# Patient Record
Sex: Female | Born: 1940 | Race: White | Hispanic: No | Marital: Married | Smoking: Never smoker
Health system: Southern US, Community
[De-identification: ages and names within clinical notes are randomized; demographics above are authoritative.]

## PROBLEM LIST (undated history)

## (undated) DIAGNOSIS — M8580 Other specified disorders of bone density and structure, unspecified site: Secondary | ICD-10-CM

## (undated) DIAGNOSIS — H9319 Tinnitus, unspecified ear: Secondary | ICD-10-CM

## (undated) DIAGNOSIS — K219 Gastro-esophageal reflux disease without esophagitis: Secondary | ICD-10-CM

## (undated) DIAGNOSIS — D126 Benign neoplasm of colon, unspecified: Secondary | ICD-10-CM

## (undated) DIAGNOSIS — E739 Lactose intolerance, unspecified: Secondary | ICD-10-CM

## (undated) DIAGNOSIS — M1990 Unspecified osteoarthritis, unspecified site: Secondary | ICD-10-CM

## (undated) DIAGNOSIS — M797 Fibromyalgia: Secondary | ICD-10-CM

## (undated) DIAGNOSIS — I447 Left bundle-branch block, unspecified: Secondary | ICD-10-CM

## (undated) DIAGNOSIS — T7840XA Allergy, unspecified, initial encounter: Secondary | ICD-10-CM

## (undated) DIAGNOSIS — J189 Pneumonia, unspecified organism: Secondary | ICD-10-CM

## (undated) DIAGNOSIS — F329 Major depressive disorder, single episode, unspecified: Secondary | ICD-10-CM

## (undated) DIAGNOSIS — E079 Disorder of thyroid, unspecified: Secondary | ICD-10-CM

## (undated) DIAGNOSIS — K8020 Calculus of gallbladder without cholecystitis without obstruction: Secondary | ICD-10-CM

## (undated) DIAGNOSIS — R51 Headache: Secondary | ICD-10-CM

## (undated) DIAGNOSIS — I10 Essential (primary) hypertension: Secondary | ICD-10-CM

## (undated) DIAGNOSIS — H269 Unspecified cataract: Secondary | ICD-10-CM

## (undated) DIAGNOSIS — G8929 Other chronic pain: Secondary | ICD-10-CM

## (undated) DIAGNOSIS — E785 Hyperlipidemia, unspecified: Secondary | ICD-10-CM

## (undated) DIAGNOSIS — G500 Trigeminal neuralgia: Secondary | ICD-10-CM

## (undated) DIAGNOSIS — F32A Depression, unspecified: Secondary | ICD-10-CM

## (undated) DIAGNOSIS — N3010 Interstitial cystitis (chronic) without hematuria: Secondary | ICD-10-CM

## (undated) HISTORY — DX: Other chronic pain: G89.29

## (undated) HISTORY — DX: Allergy, unspecified, initial encounter: T78.40XA

## (undated) HISTORY — DX: Unspecified cataract: H26.9

## (undated) HISTORY — DX: Unspecified osteoarthritis, unspecified site: M19.90

## (undated) HISTORY — DX: Benign neoplasm of colon, unspecified: D12.6

## (undated) HISTORY — DX: Depression, unspecified: F32.A

## (undated) HISTORY — DX: Headache: R51

## (undated) HISTORY — DX: Fibromyalgia: M79.7

## (undated) HISTORY — DX: Hyperlipidemia, unspecified: E78.5

## (undated) HISTORY — DX: Calculus of gallbladder without cholecystitis without obstruction: K80.20

## (undated) HISTORY — PX: TONSILLECTOMY: SUR1361

## (undated) HISTORY — DX: Left bundle-branch block, unspecified: I44.7

## (undated) HISTORY — DX: Trigeminal neuralgia: G50.0

## (undated) HISTORY — DX: Gastro-esophageal reflux disease without esophagitis: K21.9

## (undated) HISTORY — DX: Other specified disorders of bone density and structure, unspecified site: M85.80

## (undated) HISTORY — DX: Lactose intolerance, unspecified: E73.9

## (undated) HISTORY — DX: Pneumonia, unspecified organism: J18.9

## (undated) HISTORY — PX: CYSTOSCOPY: SUR368

## (undated) HISTORY — DX: Tinnitus, unspecified ear: H93.19

## (undated) HISTORY — DX: Interstitial cystitis (chronic) without hematuria: N30.10

## (undated) HISTORY — DX: Essential (primary) hypertension: I10

## (undated) HISTORY — PX: EYE SURGERY: SHX253

## (undated) HISTORY — DX: Disorder of thyroid, unspecified: E07.9

---
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
## 1998-05-21 ENCOUNTER — Ambulatory Visit (HOSPITAL_COMMUNITY): Admission: RE | Admit: 1998-05-21 | Discharge: 1998-05-21 | Payer: Self-pay | Admitting: Obstetrics & Gynecology

## 1999-09-21 ENCOUNTER — Emergency Department (HOSPITAL_COMMUNITY): Admission: EM | Admit: 1999-09-21 | Discharge: 1999-09-21 | Payer: Self-pay | Admitting: Emergency Medicine

## 2000-02-11 ENCOUNTER — Encounter: Admission: RE | Admit: 2000-02-11 | Discharge: 2000-03-10 | Payer: Self-pay

## 2000-05-10 ENCOUNTER — Inpatient Hospital Stay (HOSPITAL_COMMUNITY): Admission: EM | Admit: 2000-05-10 | Discharge: 2000-05-11 | Payer: Self-pay | Admitting: Internal Medicine

## 2001-02-07 ENCOUNTER — Other Ambulatory Visit: Admission: RE | Admit: 2001-02-07 | Discharge: 2001-02-07 | Payer: Self-pay | Admitting: Obstetrics and Gynecology

## 2002-02-02 ENCOUNTER — Other Ambulatory Visit: Admission: RE | Admit: 2002-02-02 | Discharge: 2002-02-02 | Payer: Self-pay | Admitting: Obstetrics & Gynecology

## 2003-03-21 ENCOUNTER — Other Ambulatory Visit: Admission: RE | Admit: 2003-03-21 | Discharge: 2003-03-21 | Payer: Self-pay | Admitting: Obstetrics & Gynecology

## 2004-04-17 ENCOUNTER — Other Ambulatory Visit: Admission: RE | Admit: 2004-04-17 | Discharge: 2004-04-17 | Payer: Self-pay | Admitting: Obstetrics & Gynecology

## 2005-05-20 ENCOUNTER — Other Ambulatory Visit: Admission: RE | Admit: 2005-05-20 | Discharge: 2005-05-20 | Payer: Self-pay | Admitting: Obstetrics & Gynecology

## 2007-06-23 HISTORY — PX: COLONOSCOPY: SHX174

## 2008-08-31 ENCOUNTER — Encounter: Admission: RE | Admit: 2008-08-31 | Discharge: 2008-08-31 | Payer: Self-pay | Admitting: Rheumatology

## 2010-02-16 ENCOUNTER — Emergency Department (HOSPITAL_COMMUNITY): Admission: EM | Admit: 2010-02-16 | Discharge: 2010-02-17 | Payer: Self-pay | Admitting: Emergency Medicine

## 2010-03-22 HISTORY — PX: CHOLECYSTECTOMY: SHX55

## 2010-05-30 ENCOUNTER — Encounter
Admission: RE | Admit: 2010-05-30 | Discharge: 2010-05-30 | Payer: Self-pay | Source: Home / Self Care | Attending: Internal Medicine | Admitting: Internal Medicine

## 2010-08-21 ENCOUNTER — Other Ambulatory Visit: Payer: Self-pay | Admitting: Internal Medicine

## 2010-08-21 ENCOUNTER — Ambulatory Visit
Admission: RE | Admit: 2010-08-21 | Discharge: 2010-08-21 | Disposition: A | Discharge status: Home / Self Care | Payer: Medicare Other | Source: Ambulatory Visit | Attending: Internal Medicine | Admitting: Internal Medicine

## 2010-08-21 DIAGNOSIS — R52 Pain, unspecified: Secondary | ICD-10-CM

## 2010-08-21 DIAGNOSIS — R609 Edema, unspecified: Secondary | ICD-10-CM

## 2010-09-01 ENCOUNTER — Emergency Department (HOSPITAL_COMMUNITY): Payer: Medicare Other

## 2010-09-01 ENCOUNTER — Emergency Department (HOSPITAL_COMMUNITY)
Admission: EM | Admit: 2010-09-01 | Discharge: 2010-09-01 | Disposition: A | Discharge status: Home / Self Care | Payer: Medicare Other | Attending: Emergency Medicine | Admitting: Emergency Medicine

## 2010-09-01 DIAGNOSIS — R109 Unspecified abdominal pain: Secondary | ICD-10-CM | POA: Insufficient documentation

## 2010-09-01 DIAGNOSIS — R112 Nausea with vomiting, unspecified: Secondary | ICD-10-CM | POA: Insufficient documentation

## 2010-09-01 DIAGNOSIS — Z79899 Other long term (current) drug therapy: Secondary | ICD-10-CM | POA: Insufficient documentation

## 2010-09-01 DIAGNOSIS — N1330 Unspecified hydronephrosis: Secondary | ICD-10-CM | POA: Insufficient documentation

## 2010-09-01 LAB — DIFFERENTIAL
Basophils Absolute: 0 10*3/uL (ref 0.0–0.1)
Basophils Relative: 0 % (ref 0–1)
Eosinophils Absolute: 0.2 10*3/uL (ref 0.0–0.7)
Eosinophils Relative: 2 % (ref 0–5)
Lymphocytes Relative: 10 % — ABNORMAL LOW (ref 12–46)
Lymphs Abs: 1.2 10*3/uL (ref 0.7–4.0)
Monocytes Absolute: 1 10*3/uL (ref 0.1–1.0)
Monocytes Relative: 8 % (ref 3–12)
Neutro Abs: 9.7 10*3/uL — ABNORMAL HIGH (ref 1.7–7.7)
Neutrophils Relative %: 80 % — ABNORMAL HIGH (ref 43–77)

## 2010-09-01 LAB — CBC
HCT: 44.8 % (ref 36.0–46.0)
Hemoglobin: 15 g/dL (ref 12.0–15.0)
MCH: 30.5 pg (ref 26.0–34.0)
MCHC: 33.5 g/dL (ref 30.0–36.0)
MCV: 91.2 fL (ref 78.0–100.0)
Platelets: 200 10*3/uL (ref 150–400)
RBC: 4.91 MIL/uL (ref 3.87–5.11)
RDW: 13.1 % (ref 11.5–15.5)
WBC: 12.2 10*3/uL — ABNORMAL HIGH (ref 4.0–10.5)

## 2010-09-01 LAB — COMPREHENSIVE METABOLIC PANEL
ALT: 20 U/L (ref 0–35)
AST: 22 U/L (ref 0–37)
Albumin: 3.8 g/dL (ref 3.5–5.2)
Alkaline Phosphatase: 44 U/L (ref 39–117)
BUN: 29 mg/dL — ABNORMAL HIGH (ref 6–23)
CO2: 23 mEq/L (ref 19–32)
Calcium: 9.3 mg/dL (ref 8.4–10.5)
Chloride: 105 mEq/L (ref 96–112)
Creatinine, Ser: 1.31 mg/dL — ABNORMAL HIGH (ref 0.4–1.2)
GFR calc Af Amer: 49 mL/min — ABNORMAL LOW (ref >60)
GFR calc non Af Amer: 40 mL/min — ABNORMAL LOW (ref >60)
Glucose, Bld: 124 mg/dL — ABNORMAL HIGH (ref 70–99)
Potassium: 4.2 mEq/L (ref 3.5–5.1)
Sodium: 138 mEq/L (ref 135–145)
Total Bilirubin: 0.6 mg/dL (ref 0.3–1.2)
Total Protein: 6.6 g/dL (ref 6.0–8.3)

## 2010-09-01 LAB — URINALYSIS, ROUTINE W REFLEX MICROSCOPIC
Bilirubin Urine: NEGATIVE
Glucose, UA: NEGATIVE mg/dL
Ketones, ur: NEGATIVE mg/dL
Nitrite: NEGATIVE
Protein, ur: NEGATIVE mg/dL
Specific Gravity, Urine: 1.019 (ref 1.005–1.030)
Urobilinogen, UA: 0.2 mg/dL (ref 0.0–1.0)
pH: 5.5 (ref 5.0–8.0)

## 2010-09-01 LAB — URINE MICROSCOPIC-ADD ON

## 2010-09-01 LAB — LIPASE, BLOOD: Lipase: 31 U/L (ref 11–59)

## 2010-09-05 LAB — COMPREHENSIVE METABOLIC PANEL
ALT: 53 U/L — ABNORMAL HIGH (ref 0–35)
AST: 95 U/L — ABNORMAL HIGH (ref 0–37)
Albumin: 3.8 g/dL (ref 3.5–5.2)
Alkaline Phosphatase: 65 U/L (ref 39–117)
BUN: 22 mg/dL (ref 6–23)
CO2: 27 mEq/L (ref 19–32)
Calcium: 9.9 mg/dL (ref 8.4–10.5)
Chloride: 106 mEq/L (ref 96–112)
Creatinine, Ser: 0.93 mg/dL (ref 0.4–1.2)
GFR calc Af Amer: >60 mL/min (ref >60)
GFR calc non Af Amer: 60 mL/min — ABNORMAL LOW (ref >60)
Glucose, Bld: 113 mg/dL — ABNORMAL HIGH (ref 70–99)
Potassium: 4.1 mEq/L (ref 3.5–5.1)
Sodium: 139 mEq/L (ref 135–145)
Total Bilirubin: 0.4 mg/dL (ref 0.3–1.2)
Total Protein: 6.8 g/dL (ref 6.0–8.3)

## 2010-09-05 LAB — CBC
HCT: 41.5 % (ref 36.0–46.0)
Hemoglobin: 14.3 g/dL (ref 12.0–15.0)
MCH: 31 pg (ref 26.0–34.0)
MCHC: 34.5 g/dL (ref 30.0–36.0)
MCV: 89.8 fL (ref 78.0–100.0)
Platelets: 186 10*3/uL (ref 150–400)
RBC: 4.62 MIL/uL (ref 3.87–5.11)
RDW: 13.2 % (ref 11.5–15.5)
WBC: 8.8 10*3/uL (ref 4.0–10.5)

## 2010-09-05 LAB — POCT CARDIAC MARKERS
CKMB, poc: 1.7 ng/mL (ref 1.0–8.0)
CKMB, poc: <1 ng/mL — ABNORMAL LOW (ref 1.0–8.0)
Myoglobin, poc: 54.2 ng/mL (ref 12–200)
Myoglobin, poc: 74.8 ng/mL (ref 12–200)
Troponin i, poc: <0.05 ng/mL (ref 0.00–0.09)
Troponin i, poc: <0.05 ng/mL (ref 0.00–0.09)

## 2010-09-05 LAB — DIFFERENTIAL
Basophils Absolute: 0 10*3/uL (ref 0.0–0.1)
Basophils Relative: 0 % (ref 0–1)
Eosinophils Absolute: 0.1 10*3/uL (ref 0.0–0.7)
Eosinophils Relative: 2 % (ref 0–5)
Lymphocytes Relative: 14 % (ref 12–46)
Lymphs Abs: 1.2 10*3/uL (ref 0.7–4.0)
Monocytes Absolute: 0.6 10*3/uL (ref 0.1–1.0)
Monocytes Relative: 7 % (ref 3–12)
Neutro Abs: 6.8 10*3/uL (ref 1.7–7.7)
Neutrophils Relative %: 77 % (ref 43–77)

## 2010-09-05 LAB — LIPASE, BLOOD: Lipase: 32 U/L (ref 11–59)

## 2010-09-11 ENCOUNTER — Ambulatory Visit (HOSPITAL_BASED_OUTPATIENT_CLINIC_OR_DEPARTMENT_OTHER)
Admission: RE | Admit: 2010-09-11 | Discharge: 2010-09-11 | Disposition: A | Discharge status: Home / Self Care | Payer: Medicare Other | Source: Ambulatory Visit | Attending: Urology | Admitting: Urology

## 2010-09-11 ENCOUNTER — Other Ambulatory Visit: Payer: Self-pay | Admitting: Urology

## 2010-09-11 DIAGNOSIS — N289 Disorder of kidney and ureter, unspecified: Secondary | ICD-10-CM | POA: Insufficient documentation

## 2010-09-11 DIAGNOSIS — R109 Unspecified abdominal pain: Secondary | ICD-10-CM | POA: Insufficient documentation

## 2010-09-11 DIAGNOSIS — Z01812 Encounter for preprocedural laboratory examination: Secondary | ICD-10-CM | POA: Insufficient documentation

## 2010-09-11 DIAGNOSIS — N1330 Unspecified hydronephrosis: Secondary | ICD-10-CM | POA: Insufficient documentation

## 2010-09-11 DIAGNOSIS — Z79899 Other long term (current) drug therapy: Secondary | ICD-10-CM | POA: Insufficient documentation

## 2010-09-11 LAB — POCT I-STAT 4, (NA,K, GLUC, HGB,HCT)
Glucose, Bld: 83 mg/dL (ref 70–99)
HCT: 44 % (ref 36.0–46.0)
Hemoglobin: 15 g/dL (ref 12.0–15.0)
Potassium: 3.9 mEq/L (ref 3.5–5.1)
Sodium: 145 mEq/L (ref 135–145)

## 2010-09-12 NOTE — Op Note (Signed)
NAMESUMIYE, HIRTH             ACCOUNT NO.:  [id number]  MEDICAL RECORD NO.:  [id number]           PATIENT TYPE:  E  LOCATION:  [facility]                         FACILITY:  [facility]  PHYSICIAN:  Excell Seltzer. Annabell Howells, M.D.    DATE OF BIRTH:  12-Jun-1941  DATE OF PROCEDURE: DATE OF DISCHARGE:  09/01/2010                              OPERATIVE REPORT   Patient of Dr. Bjorn Pippin.  PREOPERATIVE DIAGNOSIS:  Left proximal ureteral lesion.  POSTOPERATIVE DIAGNOSIS:  Left proximal ureteral lesion.  PROCEDURES:  Cystoscopy, left retrograde pyelogram with interpretation, left renal pelvic washings, left ureteroscopy, insertion of left double- J stent.  SURGEON:  Excell Seltzer. Annabell Howells, M.D.  ANESTHESIA:  General.  SPECIMEN:  Washings from the left renal pelvis.  DRAIN:  6-French 24 cm contour double-J stent.  COMPLICATIONS:  Minor ureteral perforation.  INDICATIONS:  Bernarda is a 70-year-old white female with a history of interstitial cystitis who presented on 12th with severe left flank pain. A CT urogram demonstrated left-sided hydronephrosis with a vague 7 x 5 mm focus of increased and attenuation and the left proximal ureter about 4 cm below the renal pelvis without definitive stone.  It was felt that ureteroscopy was indicated for possible biopsy and management of this lesion.  FINDINGS AND PROCEDURE:  She was taken to the operating room where a general anesthetic was induced.  She was given 400 mg of Cipro and was fitted PAS hose and was placed in the lithotomy position.  Her perineum and genitalia were prepped with Betadine solution.  She was draped in the usual sterile fashion.  Cystoscopy was performed using a 22-French scope and 12-degree lens.  Examination revealed a normal urethra.  The bladder wall had mild trabeculation.  There were some stellate scars on the posterior wall from prior biopsies related to interstitial cystitis. No tumors or stones were noted.  Ureteral orifices were  unremarkable.  The left ureteral orifice was cannulated with 5-French open-ended catheter and contrast was instilled.  This study revealed a delicate, but otherwise normal distal and mid ureter.  In the proximal ureter, 4 cm to 5 cm below the UPJ, there was a slight ruddiness and narrowing of the ureter.  No obvious calcification was noted and I would not describe it truly is a filling defect as much as ruddiness.  The internal collecting system had mild dilation, but was otherwise unremarkable.  Once retrograde pyelogram had been performed, a sensor guidewire was passed through the open-end catheter and during an attempt to bypass the narrowed area of the tip once through the medial ureteral wall with minimal pressure, some dye extravasated from the residual retrograde pyelogram.  At this point, I used a Glidewire angle the tip laterally and was able to negotiate the wire into the renal pelvis.  The open-end catheter was then inserted over the Glidewire.  The Glidewire was removed and a saline was used to obtain a barbotage cytology from the renal pelvis.  Once the cytology was obtained, the sensor wire was placed back to the kidney and the open-end catheter was removed.  An attempt was made to pass a 6 short  ureteroscope alongside the wire, but the meatus was too narrow.  I then passed a 12-French dilator over the wire, but it would not advance beyond the distal ureter.  The ureteroscope was then reinserted.  The distal ureter was unremarkable, but approximately 5 cm up, there was some a little mucosal splitting from the dilator and ureteroscope would not pass due to the delicate nature of the ureter proximal to this.  At this point, I felt further attempts to ureteroscopy would be counterproductive  The cystoscope was reinserted over the wire and a 6-French 24 cm contour double-J stent was inserted under fluoroscopic guidance to the kidney. The wire was removed, leaving good  coil in the kidney, good coil in the bladder.  At this point, the bladder was drained.  A B and O suppository had been placed early prior to prepping.  The patient was taken down from the lithotomy position.  Her anesthetic was reversed.  She was moved to the recovery room in stable condition.  She has pain medicine of Pyridium on hand.  She had followup scheduled for 03/29, benign going to schedule a second ureteroscopic procedure in approximately 7 to 10 days after the ureters had not an opportunity to self-dilate.     Excell Seltzer. Annabell Howells, M.D.     JJW/MEDQ  D:  09/11/2010  T:  09/12/2010  Job:  [id number]  cc:   Dr. Awanda Mink  Electronically Signed by Bjorn Pippin M.D. on 09/12/2010 07:20:02 AM

## 2010-09-18 ENCOUNTER — Other Ambulatory Visit (HOSPITAL_COMMUNITY): Payer: Medicare Other

## 2010-09-19 ENCOUNTER — Other Ambulatory Visit (HOSPITAL_COMMUNITY): Payer: Medicare Other

## 2010-09-24 ENCOUNTER — Other Ambulatory Visit: Payer: Self-pay | Admitting: Urology

## 2010-09-25 ENCOUNTER — Ambulatory Visit (HOSPITAL_COMMUNITY)
Admission: RE | Admit: 2010-09-25 | Discharge: 2010-09-25 | Disposition: A | Discharge status: Home / Self Care | Payer: Medicare Other | Source: Ambulatory Visit | Attending: Urology | Admitting: Urology

## 2010-09-25 DIAGNOSIS — N289 Disorder of kidney and ureter, unspecified: Secondary | ICD-10-CM | POA: Insufficient documentation

## 2010-09-25 DIAGNOSIS — N135 Crossing vessel and stricture of ureter without hydronephrosis: Secondary | ICD-10-CM | POA: Insufficient documentation

## 2010-09-25 DIAGNOSIS — Z01812 Encounter for preprocedural laboratory examination: Secondary | ICD-10-CM | POA: Insufficient documentation

## 2010-09-25 LAB — CBC
HCT: 38.2 % (ref 36.0–46.0)
Hemoglobin: 12.4 g/dL (ref 12.0–15.0)
MCH: 30 pg (ref 26.0–34.0)
MCHC: 32.5 g/dL (ref 30.0–36.0)
MCV: 92.5 fL (ref 78.0–100.0)
Platelets: 173 10*3/uL (ref 150–400)
RBC: 4.13 MIL/uL (ref 3.87–5.11)
RDW: 12.9 % (ref 11.5–15.5)
WBC: 5.1 10*3/uL (ref 4.0–10.5)

## 2010-09-25 LAB — MRSA PCR SCREENING: MRSA by PCR: NEGATIVE

## 2010-10-09 NOTE — Op Note (Signed)
[name], [name] NO.:  [id number]  MEDICAL RECORD NO.:  [id number]           PATIENT TYPE:  O  LOCATION:  [facility]                         FACILITY:  [facility]  PHYSICIAN:  Excell Seltzer. Annabell Howells, M.D.    DATE OF BIRTH:  May 12, 1941  DATE OF PROCEDURE:  09/25/2010 DATE OF DISCHARGE:  09/25/2010                              OPERATIVE REPORT   Patient of Dr. Bjorn Pippin.  PROCEDURES:  Cystoscopy, removal of left double-J stent, left ureteroscopy with brush biopsy of ureteral lesion, and dilation of ureteral stricture, reinsertion of left double-J stent.  PREOPERATIVE DIAGNOSIS:  Lesion of the left proximal ureter.  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 Annabell Howells, M.D.  ANESTHESIA:  General.  SPECIMEN:  Brushings from the left proximal ureter.  DRAIN:  8-French 24-cm double-J stent.  COMPLICATIONS:  None.  INDICATIONS:  [name] is a 70 year old white female who was found to have a possible lesion within the left proximal ureter following the onset of sudden, severe left flank pain on March 12.  An initial attempt at ureteroscopy was unsuccessful due to a narrow ureter and difficult access.  She had a stent left indwelling and returns today in followup for 2nd attempt.  FINDINGS AND PROCEDURE:  She was given Cipro.  She was taken to the operating room where general anesthetic was induced.  She was placed in lithotomy position.  Her perineum and genitalia were prepped with Betadine solution.  She was draped in the usual sterile fashion.  Time- out was performed.  The 22-French cystoscope with 12-degree lens was inserted.  The stent was visualized, grasped, and pulled the urethral meatus and a wire was passed to the kidney.  The old stent was removed.  The 6-French short ureteroscope was then advanced alongside the wire and during passage, there was  some narrowing of the mid ureter with some submucosal-appearing calcifications and stiffness of the ureter.  I was unable to get the rigid scope up to L2 which was the level of the lesion.  At this point, a 25-cm access sheath was inserted over the guidewire. There was some difficulty advancing the sheath due to the narrowed distal ureter; however, I was eventually able to get it up and was able to advance the 6.4-French digital flexible scope to the level of the lesion at L2.  The lesion demonstrated erythematous mucosa that appeared most consistent with inflammation, although carcinoma in situ is a possibility.  There was dystrophic calcification with a patch approximately 5 mm in size on the wall of the ureter.  I was unable to advance the scope through this area.  A guidewire was then reinserted to the kidney and a longer access sheath dilator core was then passed across the area of narrowing.  The dilator passed easily.  I then reinserted the flexible cystoscope and was able to access the area of the stricture and lesion more readily and it was noted that the ureter had split for approximately 3 cm at the 12 o'clock position, most suggestive of a stiffened inflamed  ureter with loss of pliability.  I advanced the ureteroscope to the kidney.  She had some proximal ureteral dilation, but no lesions were noted in the internal collecting system.  At this point, an attempt was made to obtain a cup biopsy from the ureteral wall; however, I was unable to engage the tissue sufficiently to obtain a biopsy.  I then removed the ureteroscope and inserted a brush biopsy device which was moved back and forth across the area of the lesion.  The brush was then clipped off and sent for pathology.  At this point, the guidewire was reinserted to the kidney.  The access sheath was removed.  The cystoscope was reinserted and an 8-French 24-cm double-J stent was then advanced to the kidney without  difficulty under fluoroscopic guidance.  The wire was removed leaving a good curl in the kidney, a good curl in the bladder.  The bladder was drained.  The patient was taken down from lithotomy position.  Her anesthetic was reversed.  She was moved to the recovery room in stable condition. There were no complications.     Excell Seltzer. Annabell Howells, M.D.     JJW/MEDQ  D:  09/25/2010  T:  09/26/2010  Job:  [id number]  cc:   Soyla Murphy. Renne Crigler, M.D. Fax: [phone]  Electronically Signed by Bjorn Pippin M.D. on 10/09/2010 02:15:29 PM

## 2010-11-07 NOTE — H&P (Signed)
[facility]  Patient:    LIVANA, YERIAN                      MRN: [id number] Adm. Date:  05/10/00 Attending:  Soyla Murphy. Renne Crigler, M.D.                         History and Physical  DATE OF BIRTH:  Sep 01, 1940.  HISTORY OF PRESENT ILLNESS:  [name] is a 70 year old married white resident of [place] who comes in with a chief complaint of chest pain.  It came on during severe exertion today at tennis clinic.  She described it as both sharp and dull at the same time in the central substernal area, radiating straight to the back.  It was associated with shortness of breath and was 8/10 in intensity.  It radiated a little up to her right shoulder blade and lasted about five minutes; she was then left with some muscle soreness.  She points to the area beneath the central breasts on both sides and front.  She denies any change in exercise tolerance lately or any other chest pain or pressure. She was a little bit sweaty with this, but she had been doing the tennis drills.  Risk factors for coronary artery disease include only a family history of early heart disease in her mother, who died of a heart attack in her 37s.  PAST HISTORY:  Past history is notable for abnormal baseline EKG -- left bundle branch block.  This was evaluated years ago with an echocardiogram, which was basically normal.  REVIEW OF SYSTEMS:  This is notable for occasional sinus headaches, hayfever that is seasonal, nasal congestion.  She denies any poor energy or poor sleep. There is no increase in weight or other weight change, night sweats, fatigue or temperature intolerance.  She denies any poor appetite, increase in thirst, anxiety, depression, memory loss, seizures, faints, numbness and tingling, tremors, lumps or bumps, skin changes, easy bruising, easy bleeding, change in vision, eye trouble, change in hearing or ringing in the ears.  She denies any balance trouble, any mood  swings, sore throat, hoarseness, wheezing, coughing, hemoptysis, orthopnea, PND, edema, palpitations, heart murmur, leg cramps, swollen feet or ankles or varicose veins.  She denies any heartburn, trouble swallowing, nausea, vomiting, diarrhea, constipation, hematochezia, vertigo, blood in bowel movements or black bowel movements.  She denies any jaundice, abdominal pain, hemorrhoids, rectal pain, change in bowel habits, kidney stones or change in her urination.  There is no vaginal or urinary discharge or burning.  She has chronic aching muscles and joints consistent with her fibromyalgia.  No back or neck pain or swollen joints noted.  She denies any breast lumps or pain or any breast discharge.  She had breast, rectal and pelvic exams done by her gynecologist in August, Dr. Esmeralda Arthur, and she does not wish to have that repeated today.  PHYSICAL EXAMINATION  VITAL SIGNS:  Temperature 96.8, respirations 20 and pulse is 83.  BP is 122/80.  GENERAL:  On examination, she appears entirely comfortable.  Color is good. She is in no distress.  SKIN:  Skin shows a few venous telangiectasias of the lower legs.  LYMPHATICS:  There is no cervical, supraclavicular, axillary or inguinal adenopathy.  HEENT:  Normocephalic, atraumatic.  Conjunctivae normal.  EOMI.  PERRL. Tympanic membranes are normal.  Fundi are normal.  Her tongue and posterior pharynx are normal.  Mucous  membranes are moist.  NECK:  Supple.  No thyromegaly.  No neck masses.  No carotid bruits.  CARDIAC:  Carotid, radial, femoral and dorsalis pedis pulses, 2+ and equal; 1+ and equal posterior tibial pulses.  Regular rate and rhythm.  Normal S1 and S2.  No murmurs, rubs nor gallops.  No JVD.  No edema.  BREASTS:  Not examined, per request.  RECTAL:  Not examined, per request.  PELVIC:  Not examined, per request.  LUNGS:  Clear to auscultation.  ABDOMEN:  Nontender.  Bowel sounds present.  No organomegaly.  No  masses.  EXTREMITIES:  No cyanosis, clubbing or edema.  NEUROLOGIC:  She is alert, oriented x 3, with cranial nerves II-XII intact. Equal muscle strength in both lower extremities with 2+ and equal deep tendon reflexes in knees.  Normal speech and gait.  EKG:  Normal sinus rhythm.  Left bundle branch block -- chronic.  IMPRESSION 1. Chest pain of uncertain etiology, with abnormal baseline electrocardiogram    -- left bundle branch block.  Rule out myocardial infarction on telemetry.    Cardiology consult orders -- may need a stress thallium study if she rules    out for myocardial infarction, given her family history of early heart    disease. 2. Intolerance to codeine -- nausea. 3. Allergic rhinitis. 4. Osteopenia of femoral neck. 5. Status post tonsillectomy. 6. Fibromyalgia. DD:  05/10/00 TD:  05/11/00 Job: [id number]

## 2010-11-07 NOTE — Discharge Summary (Signed)
[facility]  Patient:    [name], [name]                      MRN: [id number] Adm. Date:  [id number] Disc. Date: [id number] Attending:  Londell Moh                           Discharge Summary  RADIOLOGY REPORT:  EKG normal sinus rhythm, right bundle branch block.  LABORATORY DATA:  Total cholesterol 191, HDL 46, triglycerides 39, LDL 137. CKs 111 and 93 with MBs negative and troponins 0.03 which showed "no indication of myocardial injury". CMP was basically normal except for calcium and it was slightly high. Alkaline phosphatase was slightly low at 38 and this is not of clinical significance. INR and PTT were normal. Ionized calcium was done and it was normal 1.27, CBC is normal.  HOSPITAL COURSE:  Please see admission history and physical for details of her presentation. Briefly, she was admitted with chest pain. Myocardial infarction was ruled out. She was seen in consultation by Dr. Aleen Campi of cardiology. By the following day, she felt fine and had no further chest pain. She was discharged with further follow-up to be provided as an outpatient--a cardiolite study.  DISCHARGE INSTRUCTIONS:  She is discharged on the following medications; Flexeril 5 mg at bedtime as needed, Ogen 0.625 mg p.o. daily, Provera 10 mg p.o. daily on days 1-12, allegra 180 mg daily as needed, multivitamin daily, calcium daily, Nasacort AQ 2 squirts each nostril daily for 1 week, then 1 squirt daily. She is to have no strenuous activity until after her stress test and see me in follow-up in 2 weeks.  IMPRESSION: 1. Chest pain. 2. Allergic rhinitis. 3. Chronic abnormal EKG--left bundle branch block ______ with normal    echocardiogram. DD:  05/30/00 TD:  05/31/00 Job: [id number]

## 2010-11-07 NOTE — H&P (Signed)
[facility]  Patient:    [name], [name]                      MRN: [id number] Attending:  Soyla Murphy. Renne Crigler, M.D.                         History and Physical  ADDENDUM:  PERSONAL HISTORY:  A native of [place].  She has 13 years of education. She enjoys tennis.  No ethanol or tobacco use.  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  FAMILY HISTORY:  Mother died of heart trouble in her 8s, father died at age 73.  Two children alive and well.  ALLERGIES:  No known drug allergies.  Intolerance to CODEINE. DD:  05/10/00 TD:  05/11/00 Job: [id number]

## 2011-06-25 DIAGNOSIS — IMO0001 Reserved for inherently not codable concepts without codable children: Secondary | ICD-10-CM | POA: Diagnosis not present

## 2011-06-25 DIAGNOSIS — M159 Polyosteoarthritis, unspecified: Secondary | ICD-10-CM | POA: Diagnosis not present

## 2011-06-25 DIAGNOSIS — Z79899 Other long term (current) drug therapy: Secondary | ICD-10-CM | POA: Diagnosis not present

## 2011-07-09 DIAGNOSIS — Z1231 Encounter for screening mammogram for malignant neoplasm of breast: Secondary | ICD-10-CM | POA: Diagnosis not present

## 2011-07-09 DIAGNOSIS — Z124 Encounter for screening for malignant neoplasm of cervix: Secondary | ICD-10-CM | POA: Diagnosis not present

## 2011-07-09 DIAGNOSIS — Z1212 Encounter for screening for malignant neoplasm of rectum: Secondary | ICD-10-CM | POA: Diagnosis not present

## 2011-07-09 DIAGNOSIS — Z130 Encounter for screening for diseases of the blood and blood-forming organs and certain disorders involving the immune mechanism: Secondary | ICD-10-CM | POA: Diagnosis not present

## 2011-07-15 ENCOUNTER — Other Ambulatory Visit: Payer: Self-pay | Admitting: Obstetrics & Gynecology

## 2011-07-15 DIAGNOSIS — R928 Other abnormal and inconclusive findings on diagnostic imaging of breast: Secondary | ICD-10-CM

## 2011-07-22 ENCOUNTER — Ambulatory Visit
Admission: RE | Admit: 2011-07-22 | Discharge: 2011-07-22 | Disposition: A | Discharge status: Home / Self Care | Payer: Medicare Other | Source: Ambulatory Visit | Attending: Obstetrics & Gynecology | Admitting: Obstetrics & Gynecology

## 2011-07-22 DIAGNOSIS — R928 Other abnormal and inconclusive findings on diagnostic imaging of breast: Secondary | ICD-10-CM

## 2011-07-22 DIAGNOSIS — N63 Unspecified lump in unspecified breast: Secondary | ICD-10-CM | POA: Diagnosis not present

## 2011-07-22 DIAGNOSIS — N6019 Diffuse cystic mastopathy of unspecified breast: Secondary | ICD-10-CM | POA: Diagnosis not present

## 2011-08-21 DIAGNOSIS — R51 Headache: Secondary | ICD-10-CM | POA: Diagnosis not present

## 2011-08-21 DIAGNOSIS — J3089 Other allergic rhinitis: Secondary | ICD-10-CM | POA: Diagnosis not present

## 2011-09-11 DIAGNOSIS — R51 Headache: Secondary | ICD-10-CM | POA: Diagnosis not present

## 2011-09-11 DIAGNOSIS — J309 Allergic rhinitis, unspecified: Secondary | ICD-10-CM | POA: Diagnosis not present

## 2011-11-13 DIAGNOSIS — L819 Disorder of pigmentation, unspecified: Secondary | ICD-10-CM | POA: Diagnosis not present

## 2011-11-13 DIAGNOSIS — D239 Other benign neoplasm of skin, unspecified: Secondary | ICD-10-CM | POA: Diagnosis not present

## 2011-11-13 DIAGNOSIS — L570 Actinic keratosis: Secondary | ICD-10-CM | POA: Diagnosis not present

## 2011-11-13 DIAGNOSIS — L821 Other seborrheic keratosis: Secondary | ICD-10-CM | POA: Diagnosis not present

## 2011-12-23 DIAGNOSIS — M159 Polyosteoarthritis, unspecified: Secondary | ICD-10-CM | POA: Diagnosis not present

## 2011-12-23 DIAGNOSIS — IMO0001 Reserved for inherently not codable concepts without codable children: Secondary | ICD-10-CM | POA: Diagnosis not present

## 2012-01-13 DIAGNOSIS — R3989 Other symptoms and signs involving the genitourinary system: Secondary | ICD-10-CM | POA: Diagnosis not present

## 2012-01-13 DIAGNOSIS — N3010 Interstitial cystitis (chronic) without hematuria: Secondary | ICD-10-CM | POA: Diagnosis not present

## 2012-01-13 DIAGNOSIS — N135 Crossing vessel and stricture of ureter without hydronephrosis: Secondary | ICD-10-CM | POA: Diagnosis not present

## 2012-02-15 DIAGNOSIS — R142 Eructation: Secondary | ICD-10-CM | POA: Diagnosis not present

## 2012-02-15 DIAGNOSIS — L570 Actinic keratosis: Secondary | ICD-10-CM | POA: Diagnosis not present

## 2012-02-15 DIAGNOSIS — R141 Gas pain: Secondary | ICD-10-CM | POA: Diagnosis not present

## 2012-02-15 DIAGNOSIS — R143 Flatulence: Secondary | ICD-10-CM | POA: Diagnosis not present

## 2012-02-15 DIAGNOSIS — R197 Diarrhea, unspecified: Secondary | ICD-10-CM | POA: Diagnosis not present

## 2012-02-15 DIAGNOSIS — M25569 Pain in unspecified knee: Secondary | ICD-10-CM | POA: Diagnosis not present

## 2012-02-18 DIAGNOSIS — R142 Eructation: Secondary | ICD-10-CM | POA: Diagnosis not present

## 2012-02-18 DIAGNOSIS — R197 Diarrhea, unspecified: Secondary | ICD-10-CM | POA: Diagnosis not present

## 2012-02-18 DIAGNOSIS — R141 Gas pain: Secondary | ICD-10-CM | POA: Diagnosis not present

## 2012-02-18 DIAGNOSIS — R143 Flatulence: Secondary | ICD-10-CM | POA: Diagnosis not present

## 2012-03-16 DIAGNOSIS — E739 Lactose intolerance, unspecified: Secondary | ICD-10-CM | POA: Diagnosis not present

## 2012-03-16 DIAGNOSIS — M25569 Pain in unspecified knee: Secondary | ICD-10-CM | POA: Diagnosis not present

## 2012-03-16 DIAGNOSIS — Z23 Encounter for immunization: Secondary | ICD-10-CM | POA: Diagnosis not present

## 2012-03-28 ENCOUNTER — Ambulatory Visit (INDEPENDENT_AMBULATORY_CARE_PROVIDER_SITE_OTHER): Payer: Medicare Other | Admitting: Sports Medicine

## 2012-03-28 ENCOUNTER — Encounter: Payer: Self-pay | Admitting: Sports Medicine

## 2012-03-28 VITALS — BP 151/84 | HR 77 | Ht 63.0 in | Wt 147.0 lb

## 2012-03-28 DIAGNOSIS — M2242 Chondromalacia patellae, left knee: Secondary | ICD-10-CM

## 2012-03-28 DIAGNOSIS — M25561 Pain in right knee: Secondary | ICD-10-CM

## 2012-03-28 DIAGNOSIS — M25569 Pain in unspecified knee: Secondary | ICD-10-CM | POA: Diagnosis not present

## 2012-03-28 DIAGNOSIS — M2241 Chondromalacia patellae, right knee: Secondary | ICD-10-CM

## 2012-03-28 DIAGNOSIS — M2240 Chondromalacia patellae, unspecified knee: Secondary | ICD-10-CM

## 2012-03-28 NOTE — Progress Notes (Signed)
  Subjective:    Patient ID: [name], female    DOB: 1940/07/06, 71 y.o.   MRN: [id number]  HPI chief complaint: Bilateral knee pain Patient is a pleasant 71 year old female that comes in today complaining of bilateral knee pain. She has had chronic left knee pain for years. It's been treated in the past with aspiration and injection as well as 2 rounds of Visco supplementation. Each of these treatments are temporarily helpful. She had an MRI scan of her left knee done in 2010 which showed chondromalacia of the patellofemoral joint but was otherwise remarkably normal for her age. In regards to her right knee, she recently began to experience pain similar in nature to what he's experienced on the left. No recent treatment. No recent trauma. The knees do want to give way from time to time. She is here today with her husband.  Medical history is positive for fibromyalgia and arthritis Medication list is extensive and available for review on the chart. She is on Cymbalta, Mobic, Flexeril, and tramadol. She was recently on 5 mg of prednisone daily but has discontinued that. She is allergic to Pennsaid, codeine, and doxycycline Socially she does not smoke and does not list an employer on her patient question    Review of Systems as above    Objective:   Physical Exam Well-developed, well-nourished. No acute distress. Awake alert and oriented x3. Vital signs are reviewed  Each of her knees shows full range of motion. No effusion. 1-2+ patellofemoral crepitus on the left, 1+ patellofemoral crepitus on the right. Positive patellar grind. The knees are stable to valgus and varus stressing. No joint line tenderness. Negative McMurray's bilaterally. Neurovascularly intact distally bilaterally. Walking with a slight limp.   MRI of the left knee from 2010 is as above.       Assessment & Plan:  1. 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 This was done using an anterior medial approach. Bilateral body helix patellar straps were provided with instructions to wear with activity. The patient has tried several pairs of orthotics, but most of it uncomfortable. She may benefit from a custom pair orthotics and we will plan on constructing these in 3 weeks. I explained to the patient that if her symptoms persist, we could consider Visco supplementation in the right knee since she has had good success with this in her left knee. She will call with questions or concerns prior to her followup visit.  Consent obtained and verified. Time-out conducted. Noted no overlying erythema, induration, or other signs of local infection. Skin prepped in a sterile fashion. Topical analgesic spray: Ethyl chloride. Joint: Right knee Needle: 1.5 inch 25-gauge Completed without difficulty. Meds: 3 cc 0.5% Marcaine, 1 cc Depo-Medrol  Advised to call if fevers/chills, erythema, induration, drainage, or persistent bleeding.

## 2012-04-12 DIAGNOSIS — H25099 Other age-related incipient cataract, unspecified eye: Secondary | ICD-10-CM | POA: Diagnosis not present

## 2012-04-14 ENCOUNTER — Ambulatory Visit: Payer: Medicare Other | Admitting: Sports Medicine

## 2012-04-18 ENCOUNTER — Ambulatory Visit (INDEPENDENT_AMBULATORY_CARE_PROVIDER_SITE_OTHER): Payer: Medicare Other | Admitting: Sports Medicine

## 2012-04-18 VITALS — BP 130/78 | Ht 63.0 in | Wt 147.0 lb

## 2012-04-18 DIAGNOSIS — M2242 Chondromalacia patellae, left knee: Secondary | ICD-10-CM

## 2012-04-18 DIAGNOSIS — M2240 Chondromalacia patellae, unspecified knee: Secondary | ICD-10-CM | POA: Diagnosis not present

## 2012-04-18 DIAGNOSIS — M25569 Pain in unspecified knee: Secondary | ICD-10-CM | POA: Diagnosis not present

## 2012-04-18 DIAGNOSIS — M2241 Chondromalacia patellae, right knee: Secondary | ICD-10-CM

## 2012-04-18 NOTE — Progress Notes (Signed)
  Subjective:    Patient ID: [name], female    DOB: [birth year], 71 y.o.   MRN: [id number]  HPI Patient comes in today for orthotics. Right knee pain is beginning to return. Cortisone injection provided her with about one week's worth of symptom relief. An MRI scan done previously show chondromalacia patella. She's not really been diligent with her home exercises. She is wearing her body helix patellar straps. Taking Mobic 7.5 mg daily as well as Cymbalta, Flexeril, and Ultram(which she states has not been helpful)    Review of Systems     Objective:   Physical Exam Well-developed, well-nourished. No acute distress.  Each of her knees shows full motion with no effusion. She has 1-2+ patellofemoral crepitus bilaterally. No joint line tenderness. Negative McMurray's. She walks without significant limp.       Assessment & Plan:  1. 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 Patient was shown VMO quad strengthening exercises to do at home. I recommended that she increase her Mobic to 15 mg daily for one week. She will stop her Ultram and instead will use when necessary Tylenol. Followup in 3-4 weeks. She has had Visco supplementation in the left knee twice already, but she tells me that it was not very beneficial to her. If pain persists and followup, we may consider formal physical therapy.

## 2012-04-19 ENCOUNTER — Ambulatory Visit: Payer: Medicare Other | Admitting: Sports Medicine

## 2012-05-09 ENCOUNTER — Encounter: Payer: Self-pay | Admitting: Sports Medicine

## 2012-05-09 ENCOUNTER — Ambulatory Visit (INDEPENDENT_AMBULATORY_CARE_PROVIDER_SITE_OTHER): Payer: Medicare Other | Admitting: Sports Medicine

## 2012-05-09 VITALS — BP 140/90 | HR 82 | Ht 63.0 in | Wt 147.0 lb

## 2012-05-09 DIAGNOSIS — M25569 Pain in unspecified knee: Secondary | ICD-10-CM | POA: Diagnosis not present

## 2012-05-09 DIAGNOSIS — M25562 Pain in left knee: Secondary | ICD-10-CM

## 2012-05-09 DIAGNOSIS — M2240 Chondromalacia patellae, unspecified knee: Secondary | ICD-10-CM | POA: Diagnosis not present

## 2012-05-09 DIAGNOSIS — M25561 Pain in right knee: Secondary | ICD-10-CM

## 2012-05-09 DIAGNOSIS — M2241 Chondromalacia patellae, right knee: Secondary | ICD-10-CM

## 2012-05-09 DIAGNOSIS — M2242 Chondromalacia patellae, left knee: Secondary | ICD-10-CM

## 2012-05-09 NOTE — Patient Instructions (Addendum)
Start Glucosamine 1500 mg daily for 6 weeks then follow up with [place] after that.  Continue exercises and knee straps.  Rest from tennis for 1 month to see if this improves pain. You can continue swimming during that time.

## 2012-05-09 NOTE — Progress Notes (Signed)
  Subjective:    Patient ID: [name], female    DOB: 1941/02/25, 71 y.o.   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 Overall she is feeling better. About "50% better". She's doing well with her orthotics and is wearing her body helix patellar straps. She was unable to tolerate 15 mg of meloxicam do to drowsiness so she has backed it down to her normal 7.5 mg dosage. She notes that ice helps the most of her pain. Her pain is intermittent. Present after tennis as well as after swimming. She swims breaststroke and FreeStyle. Positive theater sign. No mechanical symptoms. No swelling.    Review of Systems     Objective:   Physical Exam Well-developed, well-nourished. No acute distress.  Each of her knees shows full range of motion without an effusion. 1+ patellofemoral crepitus on the left, trace patellofemoral crepitus on the right. There is tethering of each of the patella laterally. Negative McMurray's bilaterally. Good ligamentous stability bilaterally. Neurovascular intact distally. Walking without a limp.       Assessment & Plan:  1. 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 Fibromyalgia  Patient will try a 6 week trial of glucosamine/chondroitin sulfate. Continue with her body helix patellar straps and her home exercise program. I've asked that she take 4 weeks off from tennis and stick mainly to freestyle swimming. Her symptoms are worse after these two specific activities. Continue with ice when necessary. Followup with me in 6 weeks. Followup with her rheumatologist Dr. Dierdre Forth in January for her fibromyalgia.

## 2012-05-30 DIAGNOSIS — J019 Acute sinusitis, unspecified: Secondary | ICD-10-CM | POA: Diagnosis not present

## 2012-05-30 DIAGNOSIS — J309 Allergic rhinitis, unspecified: Secondary | ICD-10-CM | POA: Diagnosis not present

## 2012-06-17 ENCOUNTER — Ambulatory Visit: Payer: Medicare Other | Admitting: Sports Medicine

## 2012-06-27 ENCOUNTER — Ambulatory Visit (INDEPENDENT_AMBULATORY_CARE_PROVIDER_SITE_OTHER): Payer: Medicare Other | Admitting: Sports Medicine

## 2012-06-27 ENCOUNTER — Encounter: Payer: Self-pay | Admitting: Sports Medicine

## 2012-06-27 VITALS — BP 161/89 | HR 81 | Ht 63.0 in | Wt 147.0 lb

## 2012-06-27 DIAGNOSIS — M2240 Chondromalacia patellae, unspecified knee: Secondary | ICD-10-CM | POA: Diagnosis not present

## 2012-06-27 DIAGNOSIS — M159 Polyosteoarthritis, unspecified: Secondary | ICD-10-CM | POA: Diagnosis not present

## 2012-06-27 DIAGNOSIS — IMO0001 Reserved for inherently not codable concepts without codable children: Secondary | ICD-10-CM | POA: Diagnosis not present

## 2012-06-28 NOTE — Progress Notes (Signed)
  Subjective:    Patient ID: [name], female    DOB: 02/01/1941, 72 y.o.   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 Overall, she is feeling pretty good. She states that she has been unable to return to swimming or tennis do to obligations over the holidays as well as fighting a recent sinus infection. As a result, her knee pain is much better. Her only complaint is some left heel pain that developed over the holidays after standing and cooking for long periods in the kitchen. She recalls a podiatrist previously telling her that she had a slight leg length discrepancy with the left leg being shorter and she wonders if adding a small lift to her orthotic would be helpful. She recently saw her rheumatologist Dr. Dierdre Forth and they discussed the possibility of chronic prednisone use. He has given her permission to take 2.5 mg daily, but she wants to wait a few weeks to see if she really needs it.    Review of Systems     Objective:   Physical Exam Well-developed, well-nourished. No acute distress.  Examination of each of her knees shows full range of motion. Trace patellofemoral crepitus bilaterally but no pain with patellar compression test. No effusion. Knees remain stable to ligamentous exam. She does have a slight leg length discrepancy with the left leg being about 0.5 cm shorter in both the supine and sitting position. There is no tenderness to palpation at the calcaneal insertion of the plantar fascia. Negative calcaneal squeeze. Neurovascularly intact distally. Patient is walking without a limp.       Assessment & Plan:  1. Improved bilateral chondromalacia patella 2. Slight leg length discrepancy 3. History of osteoarthritis 4. History of fibromyalgia  I added a 3/16 inch heel lift to the left orthotic. Patient felt this to be quite comfortable before leaving the office. I think she is okay to start back with swimming  and she will ease back into tennis. I've explained the importance of a lifelong home exercise program. She will continue with her body helix patellar straps with activity. I think it is reasonable for her to wait a few weeks before deciding whether or not to resume chronic prednisone for her chronic osteoarthritis. She will continue to followup with Dr. Dierdre Forth per his request. Followup with me when necessary.

## 2012-07-05 DIAGNOSIS — J019 Acute sinusitis, unspecified: Secondary | ICD-10-CM | POA: Diagnosis not present

## 2012-07-05 DIAGNOSIS — H9110 Presbycusis, unspecified ear: Secondary | ICD-10-CM | POA: Diagnosis not present

## 2012-07-05 DIAGNOSIS — J309 Allergic rhinitis, unspecified: Secondary | ICD-10-CM | POA: Diagnosis not present

## 2012-07-05 DIAGNOSIS — H6120 Impacted cerumen, unspecified ear: Secondary | ICD-10-CM | POA: Diagnosis not present

## 2012-07-05 DIAGNOSIS — H9319 Tinnitus, unspecified ear: Secondary | ICD-10-CM | POA: Diagnosis not present

## 2012-07-19 DIAGNOSIS — M949 Disorder of cartilage, unspecified: Secondary | ICD-10-CM | POA: Diagnosis not present

## 2012-07-19 DIAGNOSIS — Z1231 Encounter for screening mammogram for malignant neoplasm of breast: Secondary | ICD-10-CM | POA: Diagnosis not present

## 2012-07-19 DIAGNOSIS — Z124 Encounter for screening for malignant neoplasm of cervix: Secondary | ICD-10-CM | POA: Diagnosis not present

## 2012-07-19 DIAGNOSIS — Z13820 Encounter for screening for osteoporosis: Secondary | ICD-10-CM | POA: Diagnosis not present

## 2012-07-19 DIAGNOSIS — M899 Disorder of bone, unspecified: Secondary | ICD-10-CM | POA: Diagnosis not present

## 2012-08-16 DIAGNOSIS — IMO0001 Reserved for inherently not codable concepts without codable children: Secondary | ICD-10-CM | POA: Diagnosis not present

## 2012-08-16 DIAGNOSIS — M159 Polyosteoarthritis, unspecified: Secondary | ICD-10-CM | POA: Diagnosis not present

## 2012-08-16 DIAGNOSIS — J309 Allergic rhinitis, unspecified: Secondary | ICD-10-CM | POA: Diagnosis not present

## 2012-09-22 DIAGNOSIS — IMO0001 Reserved for inherently not codable concepts without codable children: Secondary | ICD-10-CM | POA: Diagnosis not present

## 2012-09-22 DIAGNOSIS — Z0000 Encounter for general adult medical examination without abnormal findings: Secondary | ICD-10-CM | POA: Diagnosis not present

## 2012-09-22 DIAGNOSIS — Z79899 Other long term (current) drug therapy: Secondary | ICD-10-CM | POA: Diagnosis not present

## 2012-09-26 DIAGNOSIS — R9431 Abnormal electrocardiogram [ECG] [EKG]: Secondary | ICD-10-CM | POA: Diagnosis not present

## 2012-09-26 DIAGNOSIS — IMO0001 Reserved for inherently not codable concepts without codable children: Secondary | ICD-10-CM | POA: Diagnosis not present

## 2012-09-26 DIAGNOSIS — M159 Polyosteoarthritis, unspecified: Secondary | ICD-10-CM | POA: Diagnosis not present

## 2012-09-26 DIAGNOSIS — J309 Allergic rhinitis, unspecified: Secondary | ICD-10-CM | POA: Diagnosis not present

## 2012-09-26 DIAGNOSIS — J019 Acute sinusitis, unspecified: Secondary | ICD-10-CM | POA: Diagnosis not present

## 2012-10-13 ENCOUNTER — Encounter: Payer: Self-pay | Admitting: Internal Medicine

## 2012-10-13 DIAGNOSIS — R51 Headache: Secondary | ICD-10-CM | POA: Diagnosis not present

## 2012-10-13 DIAGNOSIS — I10 Essential (primary) hypertension: Secondary | ICD-10-CM | POA: Diagnosis not present

## 2012-10-13 DIAGNOSIS — H9319 Tinnitus, unspecified ear: Secondary | ICD-10-CM | POA: Diagnosis not present

## 2012-11-02 DIAGNOSIS — H905 Unspecified sensorineural hearing loss: Secondary | ICD-10-CM | POA: Diagnosis not present

## 2012-11-02 DIAGNOSIS — J309 Allergic rhinitis, unspecified: Secondary | ICD-10-CM | POA: Diagnosis not present

## 2012-11-02 DIAGNOSIS — M26609 Unspecified temporomandibular joint disorder, unspecified side: Secondary | ICD-10-CM | POA: Diagnosis not present

## 2012-11-02 DIAGNOSIS — H9319 Tinnitus, unspecified ear: Secondary | ICD-10-CM | POA: Diagnosis not present

## 2012-11-15 DIAGNOSIS — I10 Essential (primary) hypertension: Secondary | ICD-10-CM | POA: Diagnosis not present

## 2012-11-15 DIAGNOSIS — Z006 Encounter for examination for normal comparison and control in clinical research program: Secondary | ICD-10-CM | POA: Diagnosis not present

## 2012-11-16 DIAGNOSIS — L819 Disorder of pigmentation, unspecified: Secondary | ICD-10-CM | POA: Diagnosis not present

## 2012-11-16 DIAGNOSIS — L821 Other seborrheic keratosis: Secondary | ICD-10-CM | POA: Diagnosis not present

## 2012-11-16 DIAGNOSIS — L723 Sebaceous cyst: Secondary | ICD-10-CM | POA: Diagnosis not present

## 2012-11-16 DIAGNOSIS — D239 Other benign neoplasm of skin, unspecified: Secondary | ICD-10-CM | POA: Diagnosis not present

## 2012-11-22 ENCOUNTER — Encounter (AMBULATORY_SURGERY_CENTER): Payer: Medicare Other | Admitting: *Deleted

## 2012-11-22 ENCOUNTER — Telehealth: Payer: Self-pay | Admitting: *Deleted

## 2012-11-22 ENCOUNTER — Encounter: Payer: Medicare Other | Admitting: *Deleted

## 2012-11-22 VITALS — Ht 63.0 in | Wt 144.0 lb

## 2012-11-22 DIAGNOSIS — Z1211 Encounter for screening for malignant neoplasm of colon: Secondary | ICD-10-CM

## 2012-11-22 NOTE — Telephone Encounter (Signed)
Patient in for previsit, patient states she had last colonoscopy 5 years ago with Dr. Kinnie Scales. Records request signed and sent to office. Last colonoscopy report sent by office, procedure was 06/28/07, normal exam. It was stated in report that patient needed repeat exam 5-10 years. Pt's primary care Dr. Renne Crigler, referred patient for repeat exam. Patient states she has FOBT every year with her GYN, Dr. Jennette Kettle, last completed 1/14 and negative. Discussed with Dr. Leone Payor need for repeat colon now or at 10 year interval. He agrees patient can wait for repeat colon at 10 years. Discussed with patient and husband with understanding. Appointments cancelled. Will scan records into computer and place recall for 5 years from 06/2007.

## 2012-11-23 ENCOUNTER — Encounter: Payer: Self-pay | Admitting: Internal Medicine

## 2012-12-02 DIAGNOSIS — I10 Essential (primary) hypertension: Secondary | ICD-10-CM | POA: Diagnosis not present

## 2012-12-02 DIAGNOSIS — J019 Acute sinusitis, unspecified: Secondary | ICD-10-CM | POA: Diagnosis not present

## 2012-12-02 DIAGNOSIS — J309 Allergic rhinitis, unspecified: Secondary | ICD-10-CM | POA: Diagnosis not present

## 2012-12-06 ENCOUNTER — Encounter: Payer: Medicare Other | Admitting: Internal Medicine

## 2012-12-09 ENCOUNTER — Telehealth: Payer: Self-pay | Admitting: Internal Medicine

## 2012-12-09 NOTE — Telephone Encounter (Signed)
Rec'd from [facility] forward 3 pages 12/09/12 js.

## 2012-12-22 DIAGNOSIS — I10 Essential (primary) hypertension: Secondary | ICD-10-CM | POA: Diagnosis not present

## 2013-01-11 DIAGNOSIS — M159 Polyosteoarthritis, unspecified: Secondary | ICD-10-CM | POA: Diagnosis not present

## 2013-01-11 DIAGNOSIS — IMO0001 Reserved for inherently not codable concepts without codable children: Secondary | ICD-10-CM | POA: Diagnosis not present

## 2013-01-19 DIAGNOSIS — I10 Essential (primary) hypertension: Secondary | ICD-10-CM | POA: Diagnosis not present

## 2013-02-15 DIAGNOSIS — N393 Stress incontinence (female) (male): Secondary | ICD-10-CM | POA: Diagnosis not present

## 2013-02-15 DIAGNOSIS — N3010 Interstitial cystitis (chronic) without hematuria: Secondary | ICD-10-CM | POA: Diagnosis not present

## 2013-02-15 DIAGNOSIS — N135 Crossing vessel and stricture of ureter without hydronephrosis: Secondary | ICD-10-CM | POA: Diagnosis not present

## 2013-02-23 DIAGNOSIS — IMO0001 Reserved for inherently not codable concepts without codable children: Secondary | ICD-10-CM | POA: Diagnosis not present

## 2013-02-23 DIAGNOSIS — I10 Essential (primary) hypertension: Secondary | ICD-10-CM | POA: Diagnosis not present

## 2013-02-23 DIAGNOSIS — Z23 Encounter for immunization: Secondary | ICD-10-CM | POA: Diagnosis not present

## 2013-03-07 DIAGNOSIS — IMO0001 Reserved for inherently not codable concepts without codable children: Secondary | ICD-10-CM | POA: Diagnosis not present

## 2013-03-07 DIAGNOSIS — I10 Essential (primary) hypertension: Secondary | ICD-10-CM | POA: Diagnosis not present

## 2013-03-07 DIAGNOSIS — M25569 Pain in unspecified knee: Secondary | ICD-10-CM | POA: Diagnosis not present

## 2013-03-07 DIAGNOSIS — N3010 Interstitial cystitis (chronic) without hematuria: Secondary | ICD-10-CM | POA: Diagnosis not present

## 2013-04-04 DIAGNOSIS — M25569 Pain in unspecified knee: Secondary | ICD-10-CM | POA: Diagnosis not present

## 2013-04-04 DIAGNOSIS — IMO0001 Reserved for inherently not codable concepts without codable children: Secondary | ICD-10-CM | POA: Diagnosis not present

## 2013-04-04 DIAGNOSIS — I10 Essential (primary) hypertension: Secondary | ICD-10-CM | POA: Diagnosis not present

## 2013-04-19 DIAGNOSIS — IMO0001 Reserved for inherently not codable concepts without codable children: Secondary | ICD-10-CM | POA: Diagnosis not present

## 2013-05-02 DIAGNOSIS — IMO0001 Reserved for inherently not codable concepts without codable children: Secondary | ICD-10-CM | POA: Diagnosis not present

## 2013-05-02 DIAGNOSIS — M25569 Pain in unspecified knee: Secondary | ICD-10-CM | POA: Diagnosis not present

## 2013-05-23 DIAGNOSIS — IMO0001 Reserved for inherently not codable concepts without codable children: Secondary | ICD-10-CM | POA: Diagnosis not present

## 2013-05-23 DIAGNOSIS — M159 Polyosteoarthritis, unspecified: Secondary | ICD-10-CM | POA: Diagnosis not present

## 2013-05-25 ENCOUNTER — Encounter: Payer: Self-pay | Admitting: Sports Medicine

## 2013-05-25 ENCOUNTER — Ambulatory Visit (INDEPENDENT_AMBULATORY_CARE_PROVIDER_SITE_OTHER): Payer: Medicare Other | Admitting: Sports Medicine

## 2013-05-25 VITALS — BP 158/93 | HR 80 | Ht 63.0 in | Wt 144.0 lb

## 2013-05-25 DIAGNOSIS — M25561 Pain in right knee: Secondary | ICD-10-CM

## 2013-05-25 DIAGNOSIS — M2240 Chondromalacia patellae, unspecified knee: Secondary | ICD-10-CM | POA: Diagnosis not present

## 2013-05-25 DIAGNOSIS — M25569 Pain in unspecified knee: Secondary | ICD-10-CM | POA: Diagnosis not present

## 2013-05-25 MED ORDER — METHYLPREDNISOLONE ACETATE 40 MG/ML IJ SUSP
40.0000 mg | Freq: Once | INTRAMUSCULAR | Status: AC
  Administered 2013-05-25: 40 mg via INTRA_ARTICULAR

## 2013-05-26 NOTE — Progress Notes (Signed)
   Subjective:    Patient ID: [name], female    DOB: Feb 25, 1941, 72 y.o.   MRN: [id number]  HPI Patient comes in today complaining of returning bilateral knee pain. She has a history of bilateral chondromalacia patella. She would like to try cortisone injections. Pain is diffuse in the anterior knee. No swelling. No mechanical symptoms. No recent trauma. No radiating pain. She also has a history of fibromyalgia. She was recently taken off Cymbalta do to some sort of "allergy" to this medicine. She is now on 20 mg of nortriptyline each bedtime.    Review of Systems     Objective:   Physical Exam Well-developed, no acute distress. Sitting comfortable in exam room.  Examination of each knee shows full range of motion. No effusion. Trace patellofemoral crepitus bilaterally. No joint line tenderness. Negative McMurray's. Good ligamentous stability. Neurovascularly intact distally. Walking without significant limp.       Assessment & Plan:  Returning bilateral knee pain secondary to chondromalacia patella  Each knee was injected with cortisone today. An anterior medial approach was utilized. Patient tolerated this without difficulty. She has bilateral patellar compression straps which she finds helpful. She can continue to use these as needed. Followup with me when necessary.  Consent obtained and verified. Time-out conducted. Noted no overlying erythema, induration, or other signs of local infection. Skin prepped in a sterile fashion. Topical analgesic spray: Ethyl chloride. Joint: left knee Needle: 25g 1.5 inch Completed without difficulty. Meds: 3cc 1% xylocaine, 1cc (40mg ) depomedrol  Consent obtained and verified. Time-out conducted. Noted no overlying erythema, induration, or other signs of local infection. Skin prepped in a sterile fashion. Topical analgesic spray: Ethyl chloride. Joint: right knee Needle: 25g 1.5 inch Completed without difficulty. Meds: 3cc 1%  xylocaine, 1cc (40mg ) depomedrol  Advised to call if fevers/chills, erythema, induration, drainage, or persistent bleeding.   Advised to call if fevers/chills, erythema, induration, drainage, or persistent bleeding.

## 2013-07-13 DIAGNOSIS — IMO0001 Reserved for inherently not codable concepts without codable children: Secondary | ICD-10-CM | POA: Diagnosis not present

## 2013-07-13 DIAGNOSIS — M159 Polyosteoarthritis, unspecified: Secondary | ICD-10-CM | POA: Diagnosis not present

## 2013-07-24 DIAGNOSIS — M159 Polyosteoarthritis, unspecified: Secondary | ICD-10-CM | POA: Diagnosis not present

## 2013-07-24 DIAGNOSIS — I10 Essential (primary) hypertension: Secondary | ICD-10-CM | POA: Diagnosis not present

## 2013-07-24 DIAGNOSIS — IMO0001 Reserved for inherently not codable concepts without codable children: Secondary | ICD-10-CM | POA: Diagnosis not present

## 2013-07-25 DIAGNOSIS — Z1231 Encounter for screening mammogram for malignant neoplasm of breast: Secondary | ICD-10-CM | POA: Diagnosis not present

## 2013-07-25 DIAGNOSIS — Z1212 Encounter for screening for malignant neoplasm of rectum: Secondary | ICD-10-CM | POA: Diagnosis not present

## 2013-07-25 DIAGNOSIS — Z01419 Encounter for gynecological examination (general) (routine) without abnormal findings: Secondary | ICD-10-CM | POA: Diagnosis not present

## 2013-07-25 DIAGNOSIS — Z130 Encounter for screening for diseases of the blood and blood-forming organs and certain disorders involving the immune mechanism: Secondary | ICD-10-CM | POA: Diagnosis not present

## 2013-08-28 DIAGNOSIS — IMO0001 Reserved for inherently not codable concepts without codable children: Secondary | ICD-10-CM | POA: Diagnosis not present

## 2013-08-28 DIAGNOSIS — R197 Diarrhea, unspecified: Secondary | ICD-10-CM | POA: Diagnosis not present

## 2013-08-28 DIAGNOSIS — M159 Polyosteoarthritis, unspecified: Secondary | ICD-10-CM | POA: Diagnosis not present

## 2013-08-28 DIAGNOSIS — I10 Essential (primary) hypertension: Secondary | ICD-10-CM | POA: Diagnosis not present

## 2013-09-25 DIAGNOSIS — I10 Essential (primary) hypertension: Secondary | ICD-10-CM | POA: Diagnosis not present

## 2013-09-25 DIAGNOSIS — E7800 Pure hypercholesterolemia, unspecified: Secondary | ICD-10-CM | POA: Diagnosis not present

## 2013-09-25 DIAGNOSIS — M5430 Sciatica, unspecified side: Secondary | ICD-10-CM | POA: Diagnosis not present

## 2013-10-03 DIAGNOSIS — I10 Essential (primary) hypertension: Secondary | ICD-10-CM | POA: Diagnosis not present

## 2013-10-03 DIAGNOSIS — Z0000 Encounter for general adult medical examination without abnormal findings: Secondary | ICD-10-CM | POA: Diagnosis not present

## 2013-10-03 DIAGNOSIS — M5430 Sciatica, unspecified side: Secondary | ICD-10-CM | POA: Diagnosis not present

## 2013-10-03 DIAGNOSIS — E7800 Pure hypercholesterolemia, unspecified: Secondary | ICD-10-CM | POA: Diagnosis not present

## 2013-10-03 DIAGNOSIS — Z23 Encounter for immunization: Secondary | ICD-10-CM | POA: Diagnosis not present

## 2013-10-09 ENCOUNTER — Encounter: Payer: Self-pay | Admitting: Sports Medicine

## 2013-10-09 ENCOUNTER — Ambulatory Visit (INDEPENDENT_AMBULATORY_CARE_PROVIDER_SITE_OTHER): Payer: Medicare Other | Admitting: Sports Medicine

## 2013-10-09 VITALS — BP 138/83 | Ht 63.0 in | Wt 147.0 lb

## 2013-10-09 DIAGNOSIS — M545 Low back pain, unspecified: Secondary | ICD-10-CM | POA: Diagnosis not present

## 2013-10-09 MED ORDER — METHYLPREDNISOLONE ACETATE 40 MG/ML IJ SUSP: 40.0000 mg | Freq: Once | INTRAMUSCULAR | Status: DC

## 2013-10-09 MED ORDER — METHYLPREDNISOLONE ACETATE 80 MG/ML IJ SUSP
80.0000 mg | Freq: Once | INTRAMUSCULAR | Status: AC
  Administered 2013-10-09: 80 mg via INTRAMUSCULAR

## 2013-10-09 NOTE — Progress Notes (Signed)
Subjective:     Patient ID: [name], female   DOB: [birth year], 73 y.o.   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 Patient states her discomfort originally started with a "wet" sensation located lower right buttock region. Within a few days she had pain radiating to her left lateral 5 th metatarsal and lower back on the right. She reports a "pinching" sensation in her 5 th toe. She endorses cramping/muscle spasms in her back, buttock and calf. Although walking makes the pain better, she endorses a feeling that her leg is going to "give out". She has not sustained any injuries  Or had any surgeries to her back or hip. She states she was sitting at her computer more than usual the few weeks preceding the pain. Patient reports increase pain with movement, turning or sleeping. It is better with sitting, once she finds a comfortable position. She has attempted to use heat, which helped a little. She is now using ice and piriformis stretches that are not helping. Leg raising exercises are uncomfortable. She has tried Advil but it is upsetting her stomach. She is taking her flexeril at double dose for comfort with sleeping. Patient is currently taking 300 mg TID of gabapentin for her fibromyalgia.   Review of Systems Negative, with the exception of above mentioned in HPI     Objective:   Physical Exam BP 138/83  Ht 5\' 3"  (1.6 m)  Wt 147 lb (66.679 kg)  BMI 26.05 kg/m2 Gen: Well nourished, well developed caucasian female. No acute distress. Guarded movements.  Right Hip: No swelling or erythema. Mild TTP over piriformis. Moderate pain with back extension. No pain with flexion. Pain with internal rotation of hip that radiates to her left 5th metatarsal laterally. Negative straight leg test. Negative FABRE test. MS test bilateral LE normal, with the exception of weakened  abductors (4/5)  and right great toe extension(4/5). DTR's equal bilateral LE. Neurovascularly intact distally.    Assessment:     [name] is 73 y.o. female with complaints of right hip pain, likely due to lower back pathology concerning for possible nerve irritation/sciatica with L5 and S1 irritation.   - May continue to play tennis using pain as her guide.  - Continue gabapentin; Change of dose to 300 mg BID and 600 mg HS.  - Continue flexeril at 5 mg HS - PT ordered - IM injection of 80 mg depo medrol.  - No need for imaging at this time. If patient does not see improvement with therapy will need to obtain imagining at that time.  - F/U: 3 weeks.

## 2013-10-09 NOTE — Patient Instructions (Signed)
-   take gabapentin 300 mg twice a day, and 600 mg at bedtime - continue flexeril at 5 mg before bed.  - Use pain as your guide for tennis activity - Physical therapy ordered today - f/U 3 weeks

## 2013-10-10 DIAGNOSIS — J309 Allergic rhinitis, unspecified: Secondary | ICD-10-CM | POA: Diagnosis not present

## 2013-10-10 DIAGNOSIS — M5430 Sciatica, unspecified side: Secondary | ICD-10-CM | POA: Diagnosis not present

## 2013-10-10 DIAGNOSIS — M159 Polyosteoarthritis, unspecified: Secondary | ICD-10-CM | POA: Diagnosis not present

## 2013-10-10 DIAGNOSIS — IMO0001 Reserved for inherently not codable concepts without codable children: Secondary | ICD-10-CM | POA: Diagnosis not present

## 2013-10-10 NOTE — Addendum Note (Signed)
Addended by: Arnette Schaumann C on: 10/10/2013 11:39 AM   Modules accepted: Orders

## 2013-10-12 ENCOUNTER — Ambulatory Visit: Payer: Medicare Other | Attending: Sports Medicine | Admitting: Physical Therapy

## 2013-10-12 DIAGNOSIS — M545 Low back pain, unspecified: Secondary | ICD-10-CM | POA: Insufficient documentation

## 2013-10-12 DIAGNOSIS — M2550 Pain in unspecified joint: Secondary | ICD-10-CM | POA: Insufficient documentation

## 2013-10-12 DIAGNOSIS — IMO0001 Reserved for inherently not codable concepts without codable children: Secondary | ICD-10-CM | POA: Insufficient documentation

## 2013-10-20 HISTORY — PX: ESOPHAGOGASTRODUODENOSCOPY: SHX1529

## 2013-10-23 ENCOUNTER — Encounter: Payer: Medicare Other | Admitting: Physical Therapy

## 2013-10-23 ENCOUNTER — Ambulatory Visit: Payer: Medicare Other | Attending: Internal Medicine | Admitting: Physical Therapy

## 2013-10-23 DIAGNOSIS — Z5189 Encounter for other specified aftercare: Secondary | ICD-10-CM | POA: Diagnosis not present

## 2013-10-23 DIAGNOSIS — M2550 Pain in unspecified joint: Secondary | ICD-10-CM | POA: Insufficient documentation

## 2013-10-26 ENCOUNTER — Ambulatory Visit: Payer: Medicare Other | Admitting: Physical Therapy

## 2013-10-30 ENCOUNTER — Encounter: Payer: Self-pay | Admitting: Sports Medicine

## 2013-10-30 ENCOUNTER — Ambulatory Visit
Admission: RE | Admit: 2013-10-30 | Discharge: 2013-10-30 | Disposition: A | Discharge status: Home / Self Care | Payer: Medicare Other | Source: Ambulatory Visit | Attending: Sports Medicine | Admitting: Sports Medicine

## 2013-10-30 ENCOUNTER — Ambulatory Visit (INDEPENDENT_AMBULATORY_CARE_PROVIDER_SITE_OTHER): Payer: Medicare Other | Admitting: Sports Medicine

## 2013-10-30 VITALS — BP 134/80 | Ht 63.0 in | Wt 146.0 lb

## 2013-10-30 DIAGNOSIS — M47817 Spondylosis without myelopathy or radiculopathy, lumbosacral region: Secondary | ICD-10-CM | POA: Diagnosis not present

## 2013-10-30 DIAGNOSIS — M545 Low back pain, unspecified: Secondary | ICD-10-CM

## 2013-10-30 DIAGNOSIS — M2240 Chondromalacia patellae, unspecified knee: Secondary | ICD-10-CM | POA: Diagnosis not present

## 2013-10-30 DIAGNOSIS — M25569 Pain in unspecified knee: Secondary | ICD-10-CM | POA: Diagnosis not present

## 2013-10-30 NOTE — Progress Notes (Addendum)
   Subjective:    Patient ID: [name], female    DOB: [birth year], 73 y.o.   MRN: [id number]  HPI Patient comes in today for followup on low back pain and right leg radiculopathy. Pain persists. This is despite physical therapy and increasing her gabapentin. Her primary care physician now has her on hydrocodone and Flexeril for symptom relief. Injection of Depo-Medrol at the last visit was basically ineffective. She continues to describe an aching discomfort that begins in the right side of her low back and radiates down the right leg to the bottom of her right foot. She continues to get numbness and tingling down the leg into the foot as well. Her pain seems to be worse at night. She has been able to play tennis with very little discomfort. However, her pain at times can be as high as an 8/10. She still denies groin pain.   Review of Systems     Objective:   Physical Exam Developed, well-nourished. No acute distress  Limited lumbar mobility secondary to pain. Positive straight leg raise on the right. Mild weakness with great toe extension on the right compared to the left. No obvious atrophy. Good dorsalis pedis and posterior tibial pulses.       Assessment & Plan:  Persistent low back pain and right leg radiculopathy  X-rays of the lumbar spine including AP and lateral views. After reviewing the studies I will likely pursue an MRI scan of her lumbar spine specifically to rule out significant spinal stenosis. I will call her with the results of the MRI once available. In the meantime, he will continue with physical therapy but she will avoid extension exercises which have tended to cause her great discomfort. Continue on current medications as well but I did caution her about dizziness, drowsiness, and constipation with these medicines. Hopefully we can entertain the idea of lumbar epidural steroid injections after I reviewed the MRI. I think she is okay to continue with activity as  tolerated, including tennis.  Addendum: X-rays show facet arthropathy at L4-L5 and L5-S1. Fairly well-preserved spaces. Proceed with an MRI as discussed above.

## 2013-10-31 ENCOUNTER — Ambulatory Visit: Payer: Medicare Other | Admitting: Physical Therapy

## 2013-11-01 ENCOUNTER — Other Ambulatory Visit: Payer: Medicare Other

## 2013-11-02 ENCOUNTER — Ambulatory Visit: Payer: Medicare Other | Admitting: Physical Therapy

## 2013-11-02 ENCOUNTER — Ambulatory Visit
Admission: RE | Admit: 2013-11-02 | Discharge: 2013-11-02 | Disposition: A | Discharge status: Home / Self Care | Payer: Medicare Other | Source: Ambulatory Visit | Attending: Sports Medicine | Admitting: Sports Medicine

## 2013-11-02 DIAGNOSIS — M5126 Other intervertebral disc displacement, lumbar region: Secondary | ICD-10-CM | POA: Diagnosis not present

## 2013-11-02 DIAGNOSIS — M545 Low back pain, unspecified: Secondary | ICD-10-CM

## 2013-11-02 DIAGNOSIS — M4310 Spondylolisthesis, site unspecified: Secondary | ICD-10-CM | POA: Diagnosis not present

## 2013-11-02 DIAGNOSIS — M47817 Spondylosis without myelopathy or radiculopathy, lumbosacral region: Secondary | ICD-10-CM | POA: Diagnosis not present

## 2013-11-06 ENCOUNTER — Encounter: Payer: Self-pay | Admitting: *Deleted

## 2013-11-06 ENCOUNTER — Telehealth: Payer: Self-pay | Admitting: Sports Medicine

## 2013-11-06 ENCOUNTER — Ambulatory Visit: Payer: Medicare Other | Admitting: Physical Therapy

## 2013-11-06 DIAGNOSIS — M545 Low back pain, unspecified: Secondary | ICD-10-CM

## 2013-11-06 NOTE — Telephone Encounter (Signed)
I spoke with the patient on the phone today after reviewing the MRI of her lumbar spine. Patient has evidence of a right paracentral protrusion at L4-L5. This causes some nerve root impingement on the right. There is also moderate to severe central canal stenosis. Patient also has evidence of advanced facet arthropathy at L5-S1 with no evidence of impingement at this level. Her symptoms continue to sound radicular in nature with radiating pain down the right leg. I think that her symptoms are originating at the L4-L5 level. However, given the fact that she has changes at both L4-L5 and L5-S1, I will make a referral to [facility] imaging for facet versus epidural injections. I've asked the patient to call me one week after her initial injection for a check on her progress. She will continue with physical therapy as well.

## 2013-11-07 ENCOUNTER — Other Ambulatory Visit: Payer: Self-pay | Admitting: Sports Medicine

## 2013-11-07 DIAGNOSIS — M545 Low back pain, unspecified: Secondary | ICD-10-CM

## 2013-11-08 ENCOUNTER — Other Ambulatory Visit: Payer: Self-pay | Admitting: Sports Medicine

## 2013-11-08 ENCOUNTER — Ambulatory Visit
Admission: RE | Admit: 2013-11-08 | Discharge: 2013-11-08 | Disposition: A | Discharge status: Home / Self Care | Payer: Medicare Other | Source: Ambulatory Visit | Attending: Sports Medicine | Admitting: Sports Medicine

## 2013-11-08 DIAGNOSIS — M545 Low back pain, unspecified: Secondary | ICD-10-CM

## 2013-11-08 DIAGNOSIS — IMO0002 Reserved for concepts with insufficient information to code with codable children: Secondary | ICD-10-CM | POA: Diagnosis not present

## 2013-11-08 MED ORDER — METHYLPREDNISOLONE ACETATE 40 MG/ML INJ SUSP (RADIOLOG
120.0000 mg | Freq: Once | INTRAMUSCULAR | Status: AC
  Administered 2013-11-08: 120 mg via EPIDURAL

## 2013-11-08 MED ORDER — IOHEXOL 180 MG/ML  SOLN
1.0000 mL | Freq: Once | INTRAMUSCULAR | Status: AC | PRN
  Administered 2013-11-08: 1 mL via EPIDURAL

## 2013-11-08 NOTE — Discharge Instructions (Signed)

## 2013-11-09 ENCOUNTER — Ambulatory Visit: Payer: Medicare Other | Admitting: Physical Therapy

## 2013-11-14 DIAGNOSIS — M5430 Sciatica, unspecified side: Secondary | ICD-10-CM | POA: Diagnosis not present

## 2013-11-14 DIAGNOSIS — IMO0001 Reserved for inherently not codable concepts without codable children: Secondary | ICD-10-CM | POA: Diagnosis not present

## 2013-11-14 DIAGNOSIS — L821 Other seborrheic keratosis: Secondary | ICD-10-CM | POA: Diagnosis not present

## 2013-11-14 DIAGNOSIS — L723 Sebaceous cyst: Secondary | ICD-10-CM | POA: Diagnosis not present

## 2013-11-14 DIAGNOSIS — D1801 Hemangioma of skin and subcutaneous tissue: Secondary | ICD-10-CM | POA: Diagnosis not present

## 2013-11-14 DIAGNOSIS — L820 Inflamed seborrheic keratosis: Secondary | ICD-10-CM | POA: Diagnosis not present

## 2013-11-14 DIAGNOSIS — D239 Other benign neoplasm of skin, unspecified: Secondary | ICD-10-CM | POA: Diagnosis not present

## 2013-11-15 ENCOUNTER — Telehealth: Payer: Self-pay | Admitting: Sports Medicine

## 2013-11-15 NOTE — Telephone Encounter (Signed)
I spoke with the patient on the phone today. I asked her to give me a call one week after she received her first lumbar ESI. She had a right-sided L5 nerve block done one week ago and has had good symptom relief. She did have some returning pain earlier this week which started after she was turning over in the bed. However, she was able to take one half of a pain pill earlier today and she has done well since. I've recommended that she take a watchful waiting approach over the next week or so. She understands that if her symptoms return, then I can order a second lumbar epidural steroid injection. She will simply call my office if we need to order this (Dr Jola Baptist at [place] did her initial injection and she would like for him to do any followup injections as well).

## 2013-11-15 NOTE — Telephone Encounter (Signed)
Message copied by Thurman Coyer on Wed Nov 15, 2013  3:38 PM ------      Message from: CERESI, Threasa Beards L      Created: Wed Nov 15, 2013  1:40 PM      Regarding: voicemail message       Pt left message asking if you would call her regarding the epidural. Phone number [phone] ------

## 2013-11-16 ENCOUNTER — Ambulatory Visit: Payer: Medicare Other | Admitting: Rehabilitation

## 2013-11-20 ENCOUNTER — Other Ambulatory Visit: Payer: Self-pay | Admitting: Sports Medicine

## 2013-11-20 ENCOUNTER — Ambulatory Visit: Payer: Medicare Other | Attending: Internal Medicine | Admitting: Physical Therapy

## 2013-11-20 DIAGNOSIS — Z5189 Encounter for other specified aftercare: Secondary | ICD-10-CM | POA: Insufficient documentation

## 2013-11-20 DIAGNOSIS — M2550 Pain in unspecified joint: Secondary | ICD-10-CM | POA: Insufficient documentation

## 2013-11-20 DIAGNOSIS — M549 Dorsalgia, unspecified: Secondary | ICD-10-CM

## 2013-11-21 ENCOUNTER — Ambulatory Visit
Admission: RE | Admit: 2013-11-21 | Discharge: 2013-11-21 | Disposition: A | Discharge status: Home / Self Care | Payer: Medicare Other | Source: Ambulatory Visit | Attending: Sports Medicine | Admitting: Sports Medicine

## 2013-11-21 ENCOUNTER — Other Ambulatory Visit: Payer: Self-pay | Admitting: Sports Medicine

## 2013-11-21 DIAGNOSIS — IMO0002 Reserved for concepts with insufficient information to code with codable children: Secondary | ICD-10-CM | POA: Diagnosis not present

## 2013-11-21 DIAGNOSIS — M5126 Other intervertebral disc displacement, lumbar region: Secondary | ICD-10-CM | POA: Diagnosis not present

## 2013-11-21 DIAGNOSIS — H9319 Tinnitus, unspecified ear: Secondary | ICD-10-CM | POA: Diagnosis not present

## 2013-11-21 DIAGNOSIS — M549 Dorsalgia, unspecified: Secondary | ICD-10-CM

## 2013-11-21 DIAGNOSIS — H903 Sensorineural hearing loss, bilateral: Secondary | ICD-10-CM | POA: Diagnosis not present

## 2013-11-23 ENCOUNTER — Ambulatory Visit: Payer: Medicare Other | Admitting: Rehabilitation

## 2013-11-28 ENCOUNTER — Ambulatory Visit: Payer: Medicare Other | Admitting: Physical Therapy

## 2013-11-30 ENCOUNTER — Ambulatory Visit: Payer: Medicare Other | Admitting: Physical Therapy

## 2013-12-07 ENCOUNTER — Encounter: Payer: Self-pay | Admitting: Sports Medicine

## 2013-12-07 ENCOUNTER — Ambulatory Visit (INDEPENDENT_AMBULATORY_CARE_PROVIDER_SITE_OTHER): Payer: Medicare Other | Admitting: Sports Medicine

## 2013-12-07 VITALS — BP 124/73 | Ht 63.0 in | Wt 147.0 lb

## 2013-12-07 DIAGNOSIS — M545 Low back pain, unspecified: Secondary | ICD-10-CM | POA: Diagnosis not present

## 2013-12-07 DIAGNOSIS — M25569 Pain in unspecified knee: Secondary | ICD-10-CM | POA: Diagnosis not present

## 2013-12-07 DIAGNOSIS — M2240 Chondromalacia patellae, unspecified knee: Secondary | ICD-10-CM | POA: Diagnosis not present

## 2013-12-08 ENCOUNTER — Other Ambulatory Visit: Payer: Self-pay | Admitting: Sports Medicine

## 2013-12-08 DIAGNOSIS — M5441 Lumbago with sciatica, right side: Secondary | ICD-10-CM

## 2013-12-08 NOTE — Progress Notes (Signed)
   Subjective:    Patient ID: [name], female    DOB: 17-Sep-1940, 73 y.o.   MRN: [id number]  HPI Patient comes in today for followup on low back pain and right leg radiculopathy. She has undergone 2 L5-S1 lumbar epidural steroid injections after an MRI of her lumbar spine showed evidence of a right paracentral disc protrusion at L4-L5 as well as advanced facet arthropathy at L5-S1. Overall, her symptoms have improved. The radiculopathy she was experiencing down her right leg has resolved for the most part. She tells me that she had several days of near complete symptom relief after each injection but her symptoms have slowly begun to return. She has been given a prescription for hydrocodone 5/325 by her primary care physician and she is taking one half to one pill daily as needed. This does seem to help her pain. She is not experiencing any side effects from the medication. She is unable to take NSAIDs due to interactions with other medications that she is on. She is here today primarily to discuss her next treatment options. She has had extensive physical therapy and is well-versed in a home exercise program. After her last epidural steroid injection she had a rather aggressive deep tissue massage by the physical therapist which resulted in returning posterior right hip pain shortly thereafter. She continues to deny groin pain. No weakness.    Review of Systems     Objective:   Physical Exam Well-developed, well-nourished. No acute distress. Awake alert oriented x3. Sitting comfortable in exam room  Lumbar spine: Good lumbar range of motion. No tenderness to palpation along the lumbar midline or paraspinal muscles tear. Negative straight leg raise. Her strength is 5/5 in both lower extremities including resisted great toe extension on the right(she had some weakness here previously). Walking without a limp.       Assessment & Plan:  Low back pain and right leg radiculopathy secondary to  lumbar degenerative disc disease, spinal stenosis, and L4-L5 right paracentral disc protrusion.  Patient will be leaving on June 6 for a trip to [place]. My recommendation is for the patient to go ahead and proceed with her third and final lumbar epidural steroid injection but she will not follow this up with formal physical therapy. Instead, she will continue with her home exercises. I think it is perfectly reasonable for her to take her hydrocodone the way that she has been taking it for pain. I did warn her about the possibility of constipation or dizziness with increasing dosages. She understands. I would like to see her back in the office when she returns from her trip to [place].

## 2013-12-19 ENCOUNTER — Ambulatory Visit
Admission: RE | Admit: 2013-12-19 | Discharge: 2013-12-19 | Disposition: A | Discharge status: Home / Self Care | Payer: Medicare Other | Source: Ambulatory Visit | Attending: Sports Medicine | Admitting: Sports Medicine

## 2013-12-19 ENCOUNTER — Other Ambulatory Visit: Payer: Self-pay | Admitting: Sports Medicine

## 2013-12-19 DIAGNOSIS — M5441 Lumbago with sciatica, right side: Secondary | ICD-10-CM

## 2013-12-19 DIAGNOSIS — M5126 Other intervertebral disc displacement, lumbar region: Secondary | ICD-10-CM | POA: Diagnosis not present

## 2013-12-19 DIAGNOSIS — IMO0002 Reserved for concepts with insufficient information to code with codable children: Secondary | ICD-10-CM | POA: Diagnosis not present

## 2013-12-19 MED ORDER — METHYLPREDNISOLONE ACETATE 40 MG/ML INJ SUSP (RADIOLOG
120.0000 mg | Freq: Once | INTRAMUSCULAR | Status: AC
Start: 2013-12-19 — End: 2013-12-19
  Administered 2013-12-19: 120 mg via EPIDURAL

## 2013-12-19 MED ORDER — IOHEXOL 180 MG/ML  SOLN
1.0000 mL | Freq: Once | INTRAMUSCULAR | Status: AC | PRN
  Administered 2013-12-19: 1 mL via INTRAVENOUS

## 2014-01-08 ENCOUNTER — Ambulatory Visit (INDEPENDENT_AMBULATORY_CARE_PROVIDER_SITE_OTHER): Payer: Medicare Other | Admitting: Sports Medicine

## 2014-01-08 ENCOUNTER — Encounter: Payer: Self-pay | Admitting: Sports Medicine

## 2014-01-08 VITALS — BP 138/79 | HR 82 | Ht 63.0 in | Wt 145.0 lb

## 2014-01-08 DIAGNOSIS — R42 Dizziness and giddiness: Secondary | ICD-10-CM

## 2014-01-08 DIAGNOSIS — IMO0002 Reserved for concepts with insufficient information to code with codable children: Secondary | ICD-10-CM

## 2014-01-08 DIAGNOSIS — M5416 Radiculopathy, lumbar region: Secondary | ICD-10-CM | POA: Insufficient documentation

## 2014-01-08 NOTE — Assessment & Plan Note (Signed)
Pain much improved after 3rd ESI - Continue home exercises - Decreased gabapentin to 300 qam 600 qpm due to "Dizziness" - f/u prn

## 2014-01-08 NOTE — Progress Notes (Signed)
  [name] - 73 y.o. female MRN [id number]  Date of birth: May 10, 1941    SUBJECTIVE:     Followup for right low back pain with right leg radiculopathy.  She reports much improvement after her third ESI and was pain-free for approximately 10 days.  Since then.  She has had some return of her right lower lumbar pain with right radiculopathy to her lateral foot.  She continues to feel some numbness on the plantar surface of her right forefoot described as "tissues under my foot."  Additionally, she reports some mid sacral pain since returning home from [place], as well as describes some dizziness.  She describes her dizziness as "feeling off balance like she is listing to the left."  She denies any vertigo, vision problems, speech, problems, unilateral upper or lower extremity weakness.  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  She denies any fevers, chills, upper respiratory symptoms.   ROS:     See HPI  PERTINENT  PMH / PSH FH / / SH:  Past Medical, Surgical, Social, and Family History Reviewed & Updated in the EMR.  Pertinent findings include:   None  OBJECTIVE: BP 138/79  Pulse 82  Ht 5\' 3"  (1.6 m)  Wt 145 lb (65.772 kg)  BMI 25.69 kg/m2  Physical Exam:  Vital signs are reviewed.  Neuro: A&O; Gross UE and LE Sensory & Motor intact and symmetric  Back Exam: Inspection: Normal alignment without erythema or swelling Palpable tenderness in her mid sacrum Sensory intact bilaterally Reflexes: Patellar and Achilles 2+ bilaterally Strength: 5/5 knee extension and flexion;5/5 ankle plantar flexion, dorsiflexion Negative Romberg  Gait  Overall antalgic and balanced gait with appropriate base width and stride length  Trunk: Mild left lean  ASSESSMENT & PLAN:  See problem based charting & AVS for pt instructions.

## 2014-01-08 NOTE — Assessment & Plan Note (Signed)
Pertinent S&O  "dizziness" described as off balance and felling like she is leaning to the left  No symptoms/signs concerning to CVA/TIA Assessment  Possible medication side-effects with Gabapentin, Flexeril though symptoms started months after last dose increase  Possible balance difficulties due to LBP and radiculopathy though this is improved Plan  Lower Gabapentin dose  She will follow-up with PCP

## 2014-01-10 DIAGNOSIS — M159 Polyosteoarthritis, unspecified: Secondary | ICD-10-CM | POA: Diagnosis not present

## 2014-01-10 DIAGNOSIS — IMO0001 Reserved for inherently not codable concepts without codable children: Secondary | ICD-10-CM | POA: Diagnosis not present

## 2014-02-12 DIAGNOSIS — N3010 Interstitial cystitis (chronic) without hematuria: Secondary | ICD-10-CM | POA: Diagnosis not present

## 2014-02-14 DIAGNOSIS — N393 Stress incontinence (female) (male): Secondary | ICD-10-CM | POA: Diagnosis not present

## 2014-02-14 DIAGNOSIS — R3989 Other symptoms and signs involving the genitourinary system: Secondary | ICD-10-CM | POA: Diagnosis not present

## 2014-02-14 DIAGNOSIS — N3010 Interstitial cystitis (chronic) without hematuria: Secondary | ICD-10-CM | POA: Diagnosis not present

## 2014-03-07 DIAGNOSIS — R110 Nausea: Secondary | ICD-10-CM | POA: Diagnosis not present

## 2014-03-07 DIAGNOSIS — M545 Low back pain, unspecified: Secondary | ICD-10-CM | POA: Diagnosis not present

## 2014-03-07 DIAGNOSIS — R197 Diarrhea, unspecified: Secondary | ICD-10-CM | POA: Diagnosis not present

## 2014-03-08 ENCOUNTER — Encounter: Payer: Self-pay | Admitting: Internal Medicine

## 2014-03-08 DIAGNOSIS — Z1212 Encounter for screening for malignant neoplasm of rectum: Secondary | ICD-10-CM | POA: Diagnosis not present

## 2014-03-08 DIAGNOSIS — R197 Diarrhea, unspecified: Secondary | ICD-10-CM | POA: Diagnosis not present

## 2014-03-19 ENCOUNTER — Encounter: Payer: Self-pay | Admitting: Nurse Practitioner

## 2014-03-19 ENCOUNTER — Ambulatory Visit (INDEPENDENT_AMBULATORY_CARE_PROVIDER_SITE_OTHER): Payer: Medicare Other | Admitting: Nurse Practitioner

## 2014-03-19 VITALS — BP 138/78 | HR 72 | Ht 63.0 in | Wt 143.4 lb

## 2014-03-19 DIAGNOSIS — R198 Other specified symptoms and signs involving the digestive system and abdomen: Secondary | ICD-10-CM | POA: Diagnosis not present

## 2014-03-19 DIAGNOSIS — R110 Nausea: Secondary | ICD-10-CM

## 2014-03-19 DIAGNOSIS — R194 Change in bowel habit: Secondary | ICD-10-CM

## 2014-03-19 MED ORDER — ONDANSETRON HCL 4 MG PO TABS: 4.0000 mg | ORAL_TABLET | Freq: Three times a day (TID) | ORAL | Status: DC | PRN

## 2014-03-19 NOTE — Patient Instructions (Addendum)
You have been scheduled for an endoscopy. Please follow written instructions given to you at your visit today. If you use inhalers (even only as needed), please bring them with you on the day of your procedure. Your physician has requested that you go to www.startemmi.com and enter the access code given to you at your visit today. This web site gives a general overview about your procedure. However, you should still follow specific instructions given to you by our office regarding your preparation for the procedure. We sent a prescription for Zofran  ( Ondansetron) 4 mg to [facility].

## 2014-03-19 NOTE — Progress Notes (Signed)
HPI :  Patient is a 73 year old female, new to this practice, referred by PCP for evaluation of diarrhea. Her normal bowel habits prior to August consisted of one formed BM daily.  n August she began having increased frequency of solid stool. Over time stools turned loose and became even more frequent.  She began to have problems with nausea approximately 3 weeks ago. Patient thought bowel changes were secondary to Ibesartan which was started a year or so ago. Last Thursday she forgot to take a dose and did not have any diarrhea. She since stopped the medication and stools have decreased to twice daily.Patient plans to discuss this with PCP. Patient recalls having diarrhea associated with different medications over the last few months. Some of those medications were antihypertensives but gabapentin also caused diarrhea (in addition to dizziness and leg cramps). She had a normal screening colonoscopy by Dr. Earlean Shawl in 2009  As far as the nausea, PCP started her on pantoprazole which helped initially but then lost efficacy so it was discontinued. She has not changed any medications to account for these GI symptoms. She doesn't take NSAIDS. Patient concerned about the nausea. Overall she just doesn't feel well. No weight loss  Labs done 03/07/14: CBC normal comprehensive metabolic profile basically normal, glucose 128. CBC normal. Occult blood negative x3. I am waiting on results but patient tells me that her stool studies were all negative  Past Medical History  Diagnosis Date  . Hypertension   . Chronic headaches   . Tinnitus   . Fibromyalgia   . Interstitial cystitis   . Hyperlipemia   . LBBB (left bundle branch block)   . Lactose intolerance   . Arthritis   . Gallstones   . Pneumonia    Family History  Problem Relation Age of Onset  . Heart disease Mother   . Heart disease Father    History  Substance Use Topics  . Smoking status: Never Smoker   . Smokeless tobacco: Never Used  .  Alcohol Use: No   Current Outpatient Prescriptions  Medication Sig Dispense Refill  . calcium gluconate 500 MG tablet Take 1 tablet by mouth 2 (two) times daily.      . cetirizine (ZYRTEC) 10 MG tablet Take 10 mg by mouth daily.      . Cholecalciferol (VITAMIN D-3) 1000 UNITS CAPS Take 1 capsule by mouth daily.      . Coenzyme Q10 (CO Q 10) 100 MG CAPS Take 1 capsule by mouth daily.      . cyclobenzaprine (FLEXERIL) 10 MG tablet Take 10 mg by mouth daily as needed.      Marland Kitchen ELMIRON 100 MG capsule Take 100 mg by mouth daily.      Marland Kitchen estropipate (OGEN) 1.5 MG tablet Take 0.75 mg by mouth as directed.       . Glucosamine 500 MG CAPS Take 1 capsule by mouth daily.      Marland Kitchen HYDROcodone-acetaminophen (NORCO/VICODIN) 5-325 MG per tablet Take 0.5 tablets by mouth as needed.       . mometasone (NASONEX) 50 MCG/ACT nasal spray Place 2 sprays into the nose daily.      . montelukast (SINGULAIR) 10 MG tablet Take 10 mg by mouth at bedtime.       . pantoprazole (PROTONIX) 40 MG tablet Take 40 mg by mouth daily.      . predniSONE (DELTASONE) 5 MG tablet Take 2.5 mg by mouth daily.       Marland Kitchen  progesterone (PROMETRIUM) 100 MG capsule Take 100 mg by mouth daily. First 10 days of every other month      . ondansetron (ZOFRAN) 4 MG tablet Take 1 tablet (4 mg total) by mouth every 8 (eight) hours as needed for nausea or vomiting.  30 tablet  0   No current facility-administered medications for this visit.   Allergies  Allergen Reactions  . Persantine [Dipyridamole] Other (See Comments)    Migraines   . Butrans [Buprenorphine]   . Cymbalta [Duloxetine Hcl]   . Lactose Intolerance (Gi)   . Nortriptyline Hcl   . Other     Cat and Dog Dander   . Codeine Nausea Only  . Doxycycline Rash  . Oxycontin [Oxycodone Hcl] Other (See Comments)    Interstitial cystitis   . Pennsaid [Diclofenac Sodium] Hives  . Tramadol Itching    sedation     Review of Systems: Positive for allergy/sinus trouble, arthritis, back  pain, hearing problems, muscle pain. All other systems reviewed and negative except where noted in HPI.   Physical Exam: BP 138/78  Pulse 72  Ht 5\' 3"  (1.6 m)  Wt 143 lb 6 oz (65.034 kg)  BMI 25.40 kg/m2 Constitutional: Pleasant,well-developed, white female in no acute distress. HEENT: Normocephalic and atraumatic. Conjunctivae are normal. No scleral icterus. Neck supple.  Cardiovascular: Normal rate, regular rhythm.  Pulmonary/chest: Effort normal and breath sounds normal. No wheezing, rales or rhonchi. Abdominal: Soft, nondistended, nontender. Bowel sounds active throughout. There are no masses palpable. No hepatomegaly. Extremities: no edema Lymphadenopathy: No cervical adenopathy noted. Neurological: Alert and oriented to person place and time. Skin: Skin is warm and dry. No rashes noted. Psychiatric: Normal mood and affect. Behavior is normal.   ASSESSMENT AND PLAN:  35. 73 year old female with bowel changes.  Progressive diarrhea since August, now improving off Ibesartan.   Patient will talk to PCP about permanently discontinuing the Ibesartan.   If diarrhea recurs, then would assume it was not related to [facility] and patient will need further workup. Of note, she had a normal screening colonoscopy with Dr. Earlean Shawl in 2009    Unfortunately, patient has had diarrhea related to other medications as well since August so it may be difficult to sort this she is started on something new.   2. Nausea, three-week duration. Patient cannot correlate  time wise with any medication changes. Patient is taking 1/2 of a hydrocodone about 3 times a week . Delayed gastric emptying from narcotics is possible though she has been on hydrocodone since April and is taking a smaller dose now.   For further evaluation patient will be scheduled for upper endoscopy. The benefits, risks, and potential complications of EGD with possible biopsies were discussed with the patient and she agrees to proceed.     She is interested in an anti-emetic.  Will try Zofran, hopefully her insurance will pay for it.  3.  Multiple drug allergies  4. Interstitial cystitis, on Elmiron  5. Fibromyalgia  6. Chronic back pain / osteoarthritis.   Patient has an appointment to see Dr. Carlean Purl in November. Last year he reviewed her records to determine when next colonoscopy due.

## 2014-03-21 DIAGNOSIS — M545 Low back pain, unspecified: Secondary | ICD-10-CM | POA: Diagnosis not present

## 2014-03-21 DIAGNOSIS — I10 Essential (primary) hypertension: Secondary | ICD-10-CM | POA: Diagnosis not present

## 2014-03-21 DIAGNOSIS — R197 Diarrhea, unspecified: Secondary | ICD-10-CM | POA: Diagnosis not present

## 2014-03-25 NOTE — Progress Notes (Signed)
Agree with Ms. Guenther's assessment and plan. Carl E. Gessner, MD, FACG   

## 2014-03-27 ENCOUNTER — Ambulatory Visit (AMBULATORY_SURGERY_CENTER): Payer: Medicare Other | Admitting: Internal Medicine

## 2014-03-27 ENCOUNTER — Encounter: Payer: Self-pay | Admitting: Internal Medicine

## 2014-03-27 VITALS — BP 157/75 | HR 62 | Temp 97.4°F | Resp 32 | Ht 63.0 in | Wt 143.0 lb

## 2014-03-27 DIAGNOSIS — R110 Nausea: Secondary | ICD-10-CM | POA: Diagnosis not present

## 2014-03-27 DIAGNOSIS — M797 Fibromyalgia: Secondary | ICD-10-CM | POA: Diagnosis not present

## 2014-03-27 DIAGNOSIS — K319 Disease of stomach and duodenum, unspecified: Secondary | ICD-10-CM

## 2014-03-27 DIAGNOSIS — I10 Essential (primary) hypertension: Secondary | ICD-10-CM | POA: Diagnosis not present

## 2014-03-27 DIAGNOSIS — R197 Diarrhea, unspecified: Secondary | ICD-10-CM | POA: Diagnosis not present

## 2014-03-27 DIAGNOSIS — K2940 Chronic atrophic gastritis without bleeding: Secondary | ICD-10-CM

## 2014-03-27 DIAGNOSIS — I447 Left bundle-branch block, unspecified: Secondary | ICD-10-CM | POA: Diagnosis not present

## 2014-03-27 MED ORDER — SODIUM CHLORIDE 0.9 % IV SOLN: 500.0000 mL | INTRAVENOUS | Status: DC

## 2014-03-27 NOTE — Progress Notes (Signed)
Procedure ends, to recovery, report given and VSS. 

## 2014-03-27 NOTE — Op Note (Addendum)
[place]  [address] [place], [id number]   ENDOSCOPY PROCEDURE REPORT  PATIENT: [name], [name]  MR#: [id number] BIRTHDATE: [birth year] , 73  yrs. old GENDER: female ENDOSCOPIST: Gatha Mayer, MD, [facility] PROCEDURE DATE:  03/27/2014 PROCEDURE:  EGD w/ biopsy ASA CLASS:     Class II INDICATIONS:  nausea and unexplained diarrhea. MEDICATIONS: Propofol 130 mg IV and Monitored anesthesia care TOPICAL ANESTHETIC: none  DESCRIPTION OF PROCEDURE: After the risks benefits and alternatives of the procedure were thoroughly explained, informed consent was obtained.  The LB [id number] endoscope was introduced through the mouth and advanced to the second portion of the duodenum , Without limitations.  The instrument was slowly withdrawn as the mucosa was fully examined.    STOMACH: A diffuse patch of abnormal mucosa was found in the gastric antrum.  The mucosa was erythematous.  Multiple biopsies were performed using cold forceps.  Sample sent for histology. Otherwise normal EGD Duodenal bulb abd D2 biopsies taken to look for changes of celiac sprue. Retroflexed views revealed no abnormalities.     The scope was then withdrawn from the patient and the procedure completed.  COMPLICATIONS: There were no immediate complications.  ENDOSCOPIC IMPRESSION: 1.    Diffuse abnormal mucosa was found in the gastric antrum; The mucosa was erythematous; multiple biopsies were performed . 2.   Otherwise normal EGD   duodenal biopsies taken also - ? if pantoprazole and not irbesarten caused diarrhea  RECOMMENDATIONS: 1.  Await biopsy results 2.  Colonoscopy recall    06/2017    eSigned:  Gatha Mayer, MD, [facility] 03/27/2014 11:46 AM Revised: 03/27/2014 11:46 AM   CC: Lynnell Chad. Shelia Media, MD and The Patient

## 2014-03-27 NOTE — Patient Instructions (Addendum)
Other than some redness (erythema) in the stomach, things looked ok. I took biopsies of that and the intestine to see if we can learn anything more.  You do not need a routine colonoscopy until 2019.  I will contact you with biopsy results and follow-up/treatment plans  I appreciate the opportunity to care for you. Gatha Mayer, MD, FACG  YOU HAD AN ENDOSCOPIC PROCEDURE TODAY AT [facility]: Refer to the procedure report that was given to you for any specific questions about what was found during the examination.  If the procedure report does not answer your questions, please call your gastroenterologist to clarify.  If you requested that your care partner not be given the details of your procedure findings, then the procedure report has been included in a sealed envelope for you to review at your convenience later.  YOU SHOULD EXPECT: Some feelings of bloating in the abdomen. Passage of more gas than usual.  Walking can help get rid of the air that was put into your GI tract during the procedure and reduce the bloating. If you had a lower endoscopy (such as a colonoscopy or flexible sigmoidoscopy) you may notice spotting of blood in your stool or on the toilet paper. If you underwent a bowel prep for your procedure, then you may not have a normal bowel movement for a few days.  DIET: Your first meal following the procedure should be a light meal and then it is ok to progress to your normal diet.  A half-sandwich or bowl of soup is an example of a good first meal.  Heavy or fried foods are harder to digest and may make you feel nauseous or bloated.  Likewise meals heavy in dairy and vegetables can cause extra gas to form and this can also increase the bloating.  Drink plenty of fluids but you should avoid alcoholic beverages for 24 hours.  ACTIVITY: Your care partner should take you home directly after the procedure.  You should plan to take it easy, moving slowly for the  rest of the day.  You can resume normal activity the day after the procedure however you should NOT DRIVE or use heavy machinery for 24 hours (because of the sedation medicines used during the test).    SYMPTOMS TO REPORT IMMEDIATELY: A gastroenterologist can be reached at any hour.  During normal business hours, 8:30 AM to 5:00 PM Monday through Friday, call [phone].  After hours and on weekends, please call the GI answering service at [phone] who will take a message and have the physician on call contact you.   Following upper endoscopy (EGD)  Vomiting of blood or coffee ground material  New chest pain or pain under the shoulder blades  Painful or persistently difficult swallowing  New shortness of breath  Fever of 100F or higher  Black, tarry-looking stools  FOLLOW UP: If any biopsies were taken you will be contacted by phone or by letter within the next 1-3 weeks.  Call your gastroenterologist if you have not heard about the biopsies in 3 weeks.  Our staff will call the home number listed on your records the next business day following your procedure to check on you and address any questions or concerns that you may have at that time regarding the information given to you following your procedure. This is a courtesy call and so if there is no answer at the home number and we have not heard from you through  the emergency physician on call, we will assume that you have returned to your regular daily activities without incident.  SIGNATURES/CONFIDENTIALITY: You and/or your care partner have signed paperwork which will be entered into your electronic medical record.  These signatures attest to the fact that that the information above on your After Visit Summary has been reviewed and is understood.  Full responsibility of the confidentiality of this discharge information lies with you and/or your care-partner.

## 2014-03-27 NOTE — Progress Notes (Signed)
Called to room to assist during endoscopic procedure.  Patient ID and intended procedure confirmed with present staff. Received instructions for my participation in the procedure from the performing physician.  

## 2014-03-28 ENCOUNTER — Telehealth: Payer: Self-pay

## 2014-03-28 NOTE — Telephone Encounter (Signed)
  Follow up Call-  Call back number 03/27/2014  Post procedure Call Back phone  # [phone]  Permission to leave phone message Yes     Patient questions:  Do you have a fever, pain , or abdominal swelling? No. Pain Score  0 *  Have you tolerated food without any problems? Yes.    Have you been able to return to your normal activities? Yes.    Do you have any questions about your discharge instructions: Diet   No. Medications  No. Follow up visit  No.  Do you have questions or concerns about your Care? No.  Actions: * If pain score is 4 or above: No action needed, pain <4.  No problems per the pt. maw

## 2014-04-04 NOTE — Progress Notes (Signed)
Quick Note:  Biopsies are unrevealing - no problems Please get a symptom update re: nausea and diarrhea  [facility] - no letter or recall ______

## 2014-04-10 ENCOUNTER — Telehealth: Payer: Self-pay | Admitting: Internal Medicine

## 2014-04-10 NOTE — Telephone Encounter (Signed)
Patient with continued nausea.  She will come back and see Tye Savoy RNP on 04/16/14, she was offered an earlier appt but she declined due to a conflict

## 2014-04-12 DIAGNOSIS — R110 Nausea: Secondary | ICD-10-CM | POA: Diagnosis not present

## 2014-04-12 DIAGNOSIS — J387 Other diseases of larynx: Secondary | ICD-10-CM | POA: Diagnosis not present

## 2014-04-12 DIAGNOSIS — H9110 Presbycusis, unspecified ear: Secondary | ICD-10-CM | POA: Diagnosis not present

## 2014-04-12 DIAGNOSIS — J300 Vasomotor rhinitis: Secondary | ICD-10-CM | POA: Diagnosis not present

## 2014-04-16 ENCOUNTER — Encounter: Payer: Self-pay | Admitting: Nurse Practitioner

## 2014-04-16 ENCOUNTER — Ambulatory Visit (INDEPENDENT_AMBULATORY_CARE_PROVIDER_SITE_OTHER): Payer: Medicare Other | Admitting: Nurse Practitioner

## 2014-04-16 VITALS — BP 138/70 | HR 74 | Ht 63.0 in | Wt 144.8 lb

## 2014-04-16 DIAGNOSIS — R110 Nausea: Secondary | ICD-10-CM | POA: Diagnosis not present

## 2014-04-16 MED ORDER — ONDANSETRON HCL 4 MG PO TABS: ORAL_TABLET | ORAL | Status: DC

## 2014-04-16 NOTE — Patient Instructions (Signed)
We sent a prescription for Zofran ( ONdansetron) 4 mg to [address]. Call us in 2-3 weeks with a progress report.

## 2014-04-16 NOTE — Progress Notes (Addendum)
     History of Present Illness:   Patient is a 73 year old female who I saw late last month for evaluation increased frequency of solid stools and nausea. Patient thought the diarrhea was secondary to Ibesartin. She was apparently right as discontinuation of the medicine resulted in resolution of diarrhea. Unfortunately nausea persisted, patient recently underwent an EGD which showed diffuse gastritis. Biopsies were unremarkable. She has been taking a Zofran every morning.   Patient recently saw an ENT for sensation of mucus in her throat. ENT felt she could have LPR and recommended she try PPI. Patient had tried a PPI several weeks back to see if it helped with the nausea. She was still taking Ibesartan at the time, didn't know which medication was causing GI problems so she stopped the PPI. Since restarting the PPI last week her globus sensation has nearly resolved and nausea has actually improved as well. Patient is still taking Zofran every morning to prevent nausea.   Current Medications, Allergies, Past Medical History, Past Surgical History, Family History and Social History were reviewed in [employer] record.   Physical Exam: General: Pleasant, well developed , white female in no acute distress Head: Normocephalic and atraumatic Eyes:  sclerae anicteric, conjunctiva pink  Ears: Normal auditory acuity Lungs: Clear throughout to auscultation Heart: Regular rate and rhythm Abdomen: Soft, non distended, non-tender. No masses, no hepatomegaly. Normal bowel sounds Musculoskeletal: Symmetrical with no gross deformities  Extremities: No edema  Neurological: Alert oriented x 4, grossly nonfocal Psychological:  Alert and cooperative. Normal mood and affect  Assessment and Recommendations:  47. 73 year-old female with diarrhea, resolved since discontinuation of Ibesartan  2.  nausea, improved. She has been on a PPI for several days, this could be helping her nausea.  Recent EGD showed diffuse gastritis, biopsies unremarkable. Although nausea better it should be noted she is still taking Zofran every morning. I've asked her to discontinue scheduled Zofran and take only as needed. This will give [place] a better idea of whether nausea is really getting better. Patient will call in a few weeks with a condition update. If still having frequent nausea will consider gastric imaging study and will also need to take an in depth look at her maintenance meds which could be contributing to the nausea.    Addendum 04/26/14:  I received records from [facility] I evaluated patient a few weeks ago for diarrhea felt to be secondary to blood pressure medication. She had stool studies and I reviewed those records. Culture was negative. Ova and parasite was negative. Occult blood negative x3. CBC and CMET 03/07/14 were unremarkable.

## 2014-04-19 ENCOUNTER — Telehealth: Payer: Self-pay | Admitting: *Deleted

## 2014-04-19 DIAGNOSIS — Z23 Encounter for immunization: Secondary | ICD-10-CM | POA: Diagnosis not present

## 2014-04-19 NOTE — Telephone Encounter (Signed)
Message copied by Hulan Saas on Thu Apr 19, 2014  3:47 PM ------      Message from: Willia Craze      Created: Thu Apr 19, 2014 11:32 AM       Rollene Fare, please tell patient that there was not a thyroid study done in the records are received. I will check a TSH for her if she would like.             ------

## 2014-04-19 NOTE — Progress Notes (Signed)
Would not do a gastric emptying study for nausea without vomiting. Hopefully PPI will continue to help and not cause diarrhea. In general doubt she needs further testing.  Gatha Mayer, MD, [facility]

## 2014-04-19 NOTE — Telephone Encounter (Signed)
Left a message for patient to call back. 

## 2014-04-23 NOTE — Telephone Encounter (Signed)
Spoke with patient and she would like to have her thyroid checked her. Nevin Bloodgood, do you want just a TSH?

## 2014-04-24 ENCOUNTER — Other Ambulatory Visit: Payer: Self-pay | Admitting: *Deleted

## 2014-04-24 DIAGNOSIS — R194 Change in bowel habit: Secondary | ICD-10-CM

## 2014-04-24 NOTE — Telephone Encounter (Signed)
Lab in EPIC. Patient notified. 

## 2014-04-24 NOTE — Telephone Encounter (Signed)
Yes, TSH good baseline study. Thanks. She has bowel changes if you need diagnosis code.

## 2014-04-25 ENCOUNTER — Other Ambulatory Visit (INDEPENDENT_AMBULATORY_CARE_PROVIDER_SITE_OTHER): Payer: Medicare Other

## 2014-04-25 DIAGNOSIS — R194 Change in bowel habit: Secondary | ICD-10-CM | POA: Diagnosis not present

## 2014-04-26 LAB — TSH: TSH: 2.17 u[IU]/mL (ref 0.35–4.50)

## 2014-05-08 ENCOUNTER — Ambulatory Visit: Payer: Medicare Other | Admitting: Internal Medicine

## 2014-05-10 ENCOUNTER — Telehealth: Payer: Self-pay | Admitting: Nurse Practitioner

## 2014-05-10 NOTE — Telephone Encounter (Signed)
Spoke with patient and she states she is some better. She is having less nausea. She is still having mucous in her throat and some times has trouble breathing. She is asking about increasing her Pantoprazole to BID. She read that it can take 2-3 months for symptoms to get better and she wants to know if this is true for her. Please, advise.

## 2014-05-16 NOTE — Telephone Encounter (Signed)
Spoke with patient and the nausea is gone now.  She is not having to take Zofran more that 2-3 times/week. Spits up mucous in AM. LPR material form ENT states she may need to take 2 tablets of PPI daily. Should she increase?

## 2014-05-16 NOTE — Telephone Encounter (Signed)
Line busy - will try again later

## 2014-05-16 NOTE — Telephone Encounter (Signed)
Pt is calling back regarding this previous call.

## 2014-05-17 NOTE — Telephone Encounter (Signed)
Rollene Fare, it is okay to increase to twice daily before meals but goal is for this to be only short term. Please review anti-reflux measures with her. Thanks

## 2014-05-21 NOTE — Telephone Encounter (Signed)
Left a message for patient to call back. 

## 2014-05-21 NOTE — Telephone Encounter (Signed)
spoke

## 2014-05-21 NOTE — Telephone Encounter (Signed)
Spoke with patient and she is much better. She is not going to increase her PPI at this time.

## 2014-06-05 DIAGNOSIS — L659 Nonscarring hair loss, unspecified: Secondary | ICD-10-CM | POA: Diagnosis not present

## 2014-06-05 DIAGNOSIS — R110 Nausea: Secondary | ICD-10-CM | POA: Diagnosis not present

## 2014-06-05 DIAGNOSIS — I10 Essential (primary) hypertension: Secondary | ICD-10-CM | POA: Diagnosis not present

## 2014-06-05 DIAGNOSIS — M546 Pain in thoracic spine: Secondary | ICD-10-CM | POA: Diagnosis not present

## 2014-07-03 DIAGNOSIS — I10 Essential (primary) hypertension: Secondary | ICD-10-CM | POA: Diagnosis not present

## 2014-07-05 DIAGNOSIS — G43009 Migraine without aura, not intractable, without status migrainosus: Secondary | ICD-10-CM | POA: Diagnosis not present

## 2014-07-05 DIAGNOSIS — I10 Essential (primary) hypertension: Secondary | ICD-10-CM | POA: Diagnosis not present

## 2014-07-05 DIAGNOSIS — J309 Allergic rhinitis, unspecified: Secondary | ICD-10-CM | POA: Diagnosis not present

## 2014-07-05 DIAGNOSIS — J387 Other diseases of larynx: Secondary | ICD-10-CM | POA: Diagnosis not present

## 2014-07-20 DIAGNOSIS — M150 Primary generalized (osteo)arthritis: Secondary | ICD-10-CM | POA: Diagnosis not present

## 2014-07-20 DIAGNOSIS — M5136 Other intervertebral disc degeneration, lumbar region: Secondary | ICD-10-CM | POA: Diagnosis not present

## 2014-07-20 DIAGNOSIS — M797 Fibromyalgia: Secondary | ICD-10-CM | POA: Diagnosis not present

## 2014-07-26 ENCOUNTER — Other Ambulatory Visit: Payer: Self-pay | Admitting: Obstetrics & Gynecology

## 2014-07-26 DIAGNOSIS — Z01419 Encounter for gynecological examination (general) (routine) without abnormal findings: Secondary | ICD-10-CM | POA: Diagnosis not present

## 2014-07-26 DIAGNOSIS — Z6824 Body mass index (BMI) 24.0-24.9, adult: Secondary | ICD-10-CM | POA: Diagnosis not present

## 2014-07-26 DIAGNOSIS — Z1231 Encounter for screening mammogram for malignant neoplasm of breast: Secondary | ICD-10-CM | POA: Diagnosis not present

## 2014-07-26 DIAGNOSIS — M8588 Other specified disorders of bone density and structure, other site: Secondary | ICD-10-CM | POA: Diagnosis not present

## 2014-07-26 DIAGNOSIS — Z124 Encounter for screening for malignant neoplasm of cervix: Secondary | ICD-10-CM | POA: Diagnosis not present

## 2014-07-26 DIAGNOSIS — N958 Other specified menopausal and perimenopausal disorders: Secondary | ICD-10-CM | POA: Diagnosis not present

## 2014-07-27 LAB — CYTOLOGY - PAP

## 2014-08-09 DIAGNOSIS — I10 Essential (primary) hypertension: Secondary | ICD-10-CM | POA: Diagnosis not present

## 2014-08-09 DIAGNOSIS — J0101 Acute recurrent maxillary sinusitis: Secondary | ICD-10-CM | POA: Diagnosis not present

## 2014-08-28 DIAGNOSIS — M25461 Effusion, right knee: Secondary | ICD-10-CM | POA: Diagnosis not present

## 2014-08-28 DIAGNOSIS — I10 Essential (primary) hypertension: Secondary | ICD-10-CM | POA: Diagnosis not present

## 2014-08-29 DIAGNOSIS — M25569 Pain in unspecified knee: Secondary | ICD-10-CM | POA: Diagnosis not present

## 2014-08-29 DIAGNOSIS — M797 Fibromyalgia: Secondary | ICD-10-CM | POA: Diagnosis not present

## 2014-08-29 DIAGNOSIS — M150 Primary generalized (osteo)arthritis: Secondary | ICD-10-CM | POA: Diagnosis not present

## 2014-08-29 DIAGNOSIS — M5136 Other intervertebral disc degeneration, lumbar region: Secondary | ICD-10-CM | POA: Diagnosis not present

## 2014-09-05 DIAGNOSIS — N3010 Interstitial cystitis (chronic) without hematuria: Secondary | ICD-10-CM | POA: Diagnosis not present

## 2014-09-18 ENCOUNTER — Other Ambulatory Visit: Payer: Self-pay | Admitting: *Deleted

## 2014-09-18 DIAGNOSIS — M5416 Radiculopathy, lumbar region: Secondary | ICD-10-CM

## 2014-09-19 ENCOUNTER — Other Ambulatory Visit: Payer: Self-pay | Admitting: Sports Medicine

## 2014-09-19 DIAGNOSIS — M5416 Radiculopathy, lumbar region: Secondary | ICD-10-CM

## 2014-09-24 ENCOUNTER — Ambulatory Visit
Admission: RE | Admit: 2014-09-24 | Discharge: 2014-09-24 | Disposition: A | Discharge status: Home / Self Care | Payer: Medicare Other | Source: Ambulatory Visit | Attending: Sports Medicine | Admitting: Sports Medicine

## 2014-09-24 DIAGNOSIS — M5416 Radiculopathy, lumbar region: Secondary | ICD-10-CM

## 2014-09-24 DIAGNOSIS — M47817 Spondylosis without myelopathy or radiculopathy, lumbosacral region: Secondary | ICD-10-CM | POA: Diagnosis not present

## 2014-09-24 DIAGNOSIS — M4727 Other spondylosis with radiculopathy, lumbosacral region: Secondary | ICD-10-CM | POA: Diagnosis not present

## 2014-09-24 MED ORDER — METHYLPREDNISOLONE ACETATE 40 MG/ML INJ SUSP (RADIOLOG
120.0000 mg | Freq: Once | INTRAMUSCULAR | Status: AC
  Administered 2014-09-24: 120 mg via EPIDURAL

## 2014-09-24 MED ORDER — IOHEXOL 180 MG/ML  SOLN
1.0000 mL | Freq: Once | INTRAMUSCULAR | Status: AC | PRN
  Administered 2014-09-24: 1 mL via EPIDURAL

## 2014-09-24 NOTE — Discharge Instructions (Signed)

## 2014-09-25 DIAGNOSIS — I10 Essential (primary) hypertension: Secondary | ICD-10-CM | POA: Diagnosis not present

## 2014-09-25 DIAGNOSIS — M25461 Effusion, right knee: Secondary | ICD-10-CM | POA: Diagnosis not present

## 2014-10-08 ENCOUNTER — Other Ambulatory Visit: Payer: Self-pay | Admitting: *Deleted

## 2014-10-08 DIAGNOSIS — M5416 Radiculopathy, lumbar region: Secondary | ICD-10-CM

## 2014-10-09 ENCOUNTER — Other Ambulatory Visit: Payer: Self-pay | Admitting: Sports Medicine

## 2014-10-09 DIAGNOSIS — M5416 Radiculopathy, lumbar region: Secondary | ICD-10-CM

## 2014-10-11 ENCOUNTER — Ambulatory Visit
Admission: RE | Admit: 2014-10-11 | Discharge: 2014-10-11 | Disposition: A | Discharge status: Home / Self Care | Payer: Medicare Other | Source: Ambulatory Visit | Attending: Sports Medicine | Admitting: Sports Medicine

## 2014-10-11 DIAGNOSIS — M4727 Other spondylosis with radiculopathy, lumbosacral region: Secondary | ICD-10-CM | POA: Diagnosis not present

## 2014-10-11 DIAGNOSIS — M5416 Radiculopathy, lumbar region: Secondary | ICD-10-CM

## 2014-10-11 DIAGNOSIS — M5126 Other intervertebral disc displacement, lumbar region: Secondary | ICD-10-CM | POA: Diagnosis not present

## 2014-10-11 MED ORDER — METHYLPREDNISOLONE ACETATE 40 MG/ML INJ SUSP (RADIOLOG
120.0000 mg | Freq: Once | INTRAMUSCULAR | Status: AC
  Administered 2014-10-11: 120 mg via EPIDURAL

## 2014-10-11 MED ORDER — IOHEXOL 180 MG/ML  SOLN
1.0000 mL | Freq: Once | INTRAMUSCULAR | Status: AC | PRN
  Administered 2014-10-11: 1 mL via EPIDURAL

## 2014-10-11 NOTE — Discharge Instructions (Signed)

## 2014-10-16 DIAGNOSIS — S60512A Abrasion of left hand, initial encounter: Secondary | ICD-10-CM | POA: Diagnosis not present

## 2014-11-08 DIAGNOSIS — E559 Vitamin D deficiency, unspecified: Secondary | ICD-10-CM | POA: Diagnosis not present

## 2014-11-08 DIAGNOSIS — I10 Essential (primary) hypertension: Secondary | ICD-10-CM | POA: Diagnosis not present

## 2014-11-08 DIAGNOSIS — M8580 Other specified disorders of bone density and structure, unspecified site: Secondary | ICD-10-CM | POA: Diagnosis not present

## 2014-11-08 DIAGNOSIS — E780 Pure hypercholesterolemia: Secondary | ICD-10-CM | POA: Diagnosis not present

## 2014-11-08 DIAGNOSIS — Z0000 Encounter for general adult medical examination without abnormal findings: Secondary | ICD-10-CM | POA: Diagnosis not present

## 2014-11-13 DIAGNOSIS — I10 Essential (primary) hypertension: Secondary | ICD-10-CM | POA: Diagnosis not present

## 2014-11-13 DIAGNOSIS — R5383 Other fatigue: Secondary | ICD-10-CM | POA: Diagnosis not present

## 2014-11-13 DIAGNOSIS — M5431 Sciatica, right side: Secondary | ICD-10-CM | POA: Diagnosis not present

## 2014-11-13 DIAGNOSIS — E039 Hypothyroidism, unspecified: Secondary | ICD-10-CM | POA: Diagnosis not present

## 2014-11-20 ENCOUNTER — Other Ambulatory Visit: Payer: Self-pay | Admitting: *Deleted

## 2014-11-20 DIAGNOSIS — M5416 Radiculopathy, lumbar region: Secondary | ICD-10-CM

## 2014-11-22 DIAGNOSIS — R5383 Other fatigue: Secondary | ICD-10-CM | POA: Diagnosis not present

## 2014-11-22 DIAGNOSIS — E039 Hypothyroidism, unspecified: Secondary | ICD-10-CM | POA: Diagnosis not present

## 2014-11-22 DIAGNOSIS — R0981 Nasal congestion: Secondary | ICD-10-CM | POA: Diagnosis not present

## 2014-11-23 ENCOUNTER — Other Ambulatory Visit: Payer: Self-pay | Admitting: Sports Medicine

## 2014-11-23 DIAGNOSIS — M545 Low back pain, unspecified: Secondary | ICD-10-CM

## 2014-11-23 DIAGNOSIS — M5416 Radiculopathy, lumbar region: Secondary | ICD-10-CM

## 2014-11-26 ENCOUNTER — Inpatient Hospital Stay: Admission: RE | Admit: 2014-11-26 | Payer: Medicare Other | Source: Ambulatory Visit

## 2014-11-29 DIAGNOSIS — H9313 Tinnitus, bilateral: Secondary | ICD-10-CM | POA: Diagnosis not present

## 2014-11-29 DIAGNOSIS — H903 Sensorineural hearing loss, bilateral: Secondary | ICD-10-CM | POA: Diagnosis not present

## 2014-12-03 ENCOUNTER — Ambulatory Visit
Admission: RE | Admit: 2014-12-03 | Discharge: 2014-12-03 | Disposition: A | Discharge status: Home / Self Care | Payer: Medicare Other | Source: Ambulatory Visit | Attending: Sports Medicine | Admitting: Sports Medicine

## 2014-12-03 VITALS — BP 140/67 | HR 70

## 2014-12-03 DIAGNOSIS — M5416 Radiculopathy, lumbar region: Secondary | ICD-10-CM

## 2014-12-03 DIAGNOSIS — M545 Low back pain, unspecified: Secondary | ICD-10-CM

## 2014-12-03 MED ORDER — METHYLPREDNISOLONE ACETATE 40 MG/ML INJ SUSP (RADIOLOG
120.0000 mg | Freq: Once | INTRAMUSCULAR | Status: AC
  Administered 2014-12-03: 120 mg via EPIDURAL

## 2014-12-03 MED ORDER — IOHEXOL 180 MG/ML  SOLN
1.0000 mL | Freq: Once | INTRAMUSCULAR | Status: AC | PRN
  Administered 2014-12-03: 1 mL via EPIDURAL

## 2014-12-03 NOTE — Discharge Instructions (Signed)
Post Procedure Spinal Discharge Instruction Sheet  1. You may resume a regular diet and any medications that you routinely take (including pain medications).  2. No driving day of procedure.  3. Light activity throughout the rest of the day.  Do not do any strenuous work, exercise, bending or lifting.  The day following the procedure, you can resume normal physical activity but you should refrain from exercising or physical therapy for at least three days thereafter.   Common Side Effects:   Headaches- take your usual medications as directed by your physician.  Increase your fluid intake.  Caffeinated beverages may be helpful.  Lie flat in bed until your headache resolves.   Restlessness or inability to sleep- you may have trouble sleeping for the next few days.  Ask your referring physician if you need any medication for sleep.   Facial flushing or redness- should subside within a few days.   Increased pain- a temporary increase in pain a day or two following your procedure is not unusual.  Take your pain medication as prescribed by your referring physician.   Leg cramps  Please contact our office at [phone] for the following symptoms:  Fever greater than 100 degrees.  Headaches unresolved with medication after 2-3 days.  Increased swelling, pain, or redness at injection site.  Thank you for visiting our office.   You may resume Elmiron today.

## 2014-12-18 DIAGNOSIS — D225 Melanocytic nevi of trunk: Secondary | ICD-10-CM | POA: Diagnosis not present

## 2014-12-18 DIAGNOSIS — D692 Other nonthrombocytopenic purpura: Secondary | ICD-10-CM | POA: Diagnosis not present

## 2014-12-18 DIAGNOSIS — D224 Melanocytic nevi of scalp and neck: Secondary | ICD-10-CM | POA: Diagnosis not present

## 2014-12-18 DIAGNOSIS — L821 Other seborrheic keratosis: Secondary | ICD-10-CM | POA: Diagnosis not present

## 2014-12-18 DIAGNOSIS — L905 Scar conditions and fibrosis of skin: Secondary | ICD-10-CM | POA: Diagnosis not present

## 2014-12-20 DIAGNOSIS — M797 Fibromyalgia: Secondary | ICD-10-CM | POA: Diagnosis not present

## 2014-12-20 DIAGNOSIS — M150 Primary generalized (osteo)arthritis: Secondary | ICD-10-CM | POA: Diagnosis not present

## 2014-12-20 DIAGNOSIS — M5136 Other intervertebral disc degeneration, lumbar region: Secondary | ICD-10-CM | POA: Diagnosis not present

## 2014-12-20 DIAGNOSIS — M25561 Pain in right knee: Secondary | ICD-10-CM | POA: Diagnosis not present

## 2015-01-03 DIAGNOSIS — R600 Localized edema: Secondary | ICD-10-CM | POA: Diagnosis not present

## 2015-01-03 DIAGNOSIS — J387 Other diseases of larynx: Secondary | ICD-10-CM | POA: Diagnosis not present

## 2015-01-03 DIAGNOSIS — J3489 Other specified disorders of nose and nasal sinuses: Secondary | ICD-10-CM | POA: Diagnosis not present

## 2015-01-03 DIAGNOSIS — I10 Essential (primary) hypertension: Secondary | ICD-10-CM | POA: Diagnosis not present

## 2015-01-30 DIAGNOSIS — J300 Vasomotor rhinitis: Secondary | ICD-10-CM | POA: Diagnosis not present

## 2015-01-30 DIAGNOSIS — J309 Allergic rhinitis, unspecified: Secondary | ICD-10-CM | POA: Diagnosis not present

## 2015-02-13 DIAGNOSIS — N3010 Interstitial cystitis (chronic) without hematuria: Secondary | ICD-10-CM | POA: Diagnosis not present

## 2015-02-19 DIAGNOSIS — R252 Cramp and spasm: Secondary | ICD-10-CM | POA: Diagnosis not present

## 2015-02-19 DIAGNOSIS — I10 Essential (primary) hypertension: Secondary | ICD-10-CM | POA: Diagnosis not present

## 2015-02-19 DIAGNOSIS — R600 Localized edema: Secondary | ICD-10-CM | POA: Diagnosis not present

## 2015-02-19 DIAGNOSIS — E039 Hypothyroidism, unspecified: Secondary | ICD-10-CM | POA: Diagnosis not present

## 2015-02-20 DIAGNOSIS — R3989 Other symptoms and signs involving the genitourinary system: Secondary | ICD-10-CM | POA: Diagnosis not present

## 2015-02-20 DIAGNOSIS — N393 Stress incontinence (female) (male): Secondary | ICD-10-CM | POA: Diagnosis not present

## 2015-02-20 DIAGNOSIS — N3010 Interstitial cystitis (chronic) without hematuria: Secondary | ICD-10-CM | POA: Diagnosis not present

## 2015-02-28 DIAGNOSIS — E780 Pure hypercholesterolemia: Secondary | ICD-10-CM | POA: Diagnosis not present

## 2015-02-28 DIAGNOSIS — I10 Essential (primary) hypertension: Secondary | ICD-10-CM | POA: Diagnosis not present

## 2015-02-28 DIAGNOSIS — M546 Pain in thoracic spine: Secondary | ICD-10-CM | POA: Diagnosis not present

## 2015-02-28 DIAGNOSIS — J309 Allergic rhinitis, unspecified: Secondary | ICD-10-CM | POA: Diagnosis not present

## 2015-03-04 DIAGNOSIS — R3989 Other symptoms and signs involving the genitourinary system: Secondary | ICD-10-CM | POA: Diagnosis not present

## 2015-03-04 DIAGNOSIS — R278 Other lack of coordination: Secondary | ICD-10-CM | POA: Diagnosis not present

## 2015-03-04 DIAGNOSIS — M62838 Other muscle spasm: Secondary | ICD-10-CM | POA: Diagnosis not present

## 2015-03-04 DIAGNOSIS — N393 Stress incontinence (female) (male): Secondary | ICD-10-CM | POA: Diagnosis not present

## 2015-03-04 DIAGNOSIS — M6281 Muscle weakness (generalized): Secondary | ICD-10-CM | POA: Diagnosis not present

## 2015-03-04 DIAGNOSIS — N3946 Mixed incontinence: Secondary | ICD-10-CM | POA: Diagnosis not present

## 2015-03-08 DIAGNOSIS — E039 Hypothyroidism, unspecified: Secondary | ICD-10-CM | POA: Diagnosis not present

## 2015-03-08 DIAGNOSIS — J387 Other diseases of larynx: Secondary | ICD-10-CM | POA: Diagnosis not present

## 2015-03-08 DIAGNOSIS — R110 Nausea: Secondary | ICD-10-CM | POA: Diagnosis not present

## 2015-03-08 DIAGNOSIS — I10 Essential (primary) hypertension: Secondary | ICD-10-CM | POA: Diagnosis not present

## 2015-03-13 DIAGNOSIS — N3946 Mixed incontinence: Secondary | ICD-10-CM | POA: Diagnosis not present

## 2015-03-13 DIAGNOSIS — R102 Pelvic and perineal pain: Secondary | ICD-10-CM | POA: Diagnosis not present

## 2015-03-13 DIAGNOSIS — M6281 Muscle weakness (generalized): Secondary | ICD-10-CM | POA: Diagnosis not present

## 2015-03-13 DIAGNOSIS — M62838 Other muscle spasm: Secondary | ICD-10-CM | POA: Diagnosis not present

## 2015-03-13 DIAGNOSIS — R278 Other lack of coordination: Secondary | ICD-10-CM | POA: Diagnosis not present

## 2015-03-21 DIAGNOSIS — M6281 Muscle weakness (generalized): Secondary | ICD-10-CM | POA: Diagnosis not present

## 2015-03-21 DIAGNOSIS — R278 Other lack of coordination: Secondary | ICD-10-CM | POA: Diagnosis not present

## 2015-03-21 DIAGNOSIS — M62838 Other muscle spasm: Secondary | ICD-10-CM | POA: Diagnosis not present

## 2015-03-21 DIAGNOSIS — N3946 Mixed incontinence: Secondary | ICD-10-CM | POA: Diagnosis not present

## 2015-03-21 DIAGNOSIS — R102 Pelvic and perineal pain: Secondary | ICD-10-CM | POA: Diagnosis not present

## 2015-04-01 DIAGNOSIS — E039 Hypothyroidism, unspecified: Secondary | ICD-10-CM | POA: Diagnosis not present

## 2015-04-02 DIAGNOSIS — M6281 Muscle weakness (generalized): Secondary | ICD-10-CM | POA: Diagnosis not present

## 2015-04-02 DIAGNOSIS — N3946 Mixed incontinence: Secondary | ICD-10-CM | POA: Diagnosis not present

## 2015-04-02 DIAGNOSIS — M62838 Other muscle spasm: Secondary | ICD-10-CM | POA: Diagnosis not present

## 2015-04-02 DIAGNOSIS — R102 Pelvic and perineal pain: Secondary | ICD-10-CM | POA: Diagnosis not present

## 2015-04-02 DIAGNOSIS — R278 Other lack of coordination: Secondary | ICD-10-CM | POA: Diagnosis not present

## 2015-04-03 DIAGNOSIS — R5383 Other fatigue: Secondary | ICD-10-CM | POA: Diagnosis not present

## 2015-04-03 DIAGNOSIS — R110 Nausea: Secondary | ICD-10-CM | POA: Diagnosis not present

## 2015-04-03 DIAGNOSIS — R252 Cramp and spasm: Secondary | ICD-10-CM | POA: Diagnosis not present

## 2015-04-03 DIAGNOSIS — M25569 Pain in unspecified knee: Secondary | ICD-10-CM | POA: Diagnosis not present

## 2015-04-10 DIAGNOSIS — M62838 Other muscle spasm: Secondary | ICD-10-CM | POA: Diagnosis not present

## 2015-04-10 DIAGNOSIS — R278 Other lack of coordination: Secondary | ICD-10-CM | POA: Diagnosis not present

## 2015-04-10 DIAGNOSIS — M6281 Muscle weakness (generalized): Secondary | ICD-10-CM | POA: Diagnosis not present

## 2015-04-10 DIAGNOSIS — R102 Pelvic and perineal pain: Secondary | ICD-10-CM | POA: Diagnosis not present

## 2015-04-10 DIAGNOSIS — M533 Sacrococcygeal disorders, not elsewhere classified: Secondary | ICD-10-CM | POA: Diagnosis not present

## 2015-04-18 DIAGNOSIS — M6281 Muscle weakness (generalized): Secondary | ICD-10-CM | POA: Diagnosis not present

## 2015-04-18 DIAGNOSIS — M62838 Other muscle spasm: Secondary | ICD-10-CM | POA: Diagnosis not present

## 2015-04-18 DIAGNOSIS — N3946 Mixed incontinence: Secondary | ICD-10-CM | POA: Diagnosis not present

## 2015-04-18 DIAGNOSIS — R278 Other lack of coordination: Secondary | ICD-10-CM | POA: Diagnosis not present

## 2015-04-18 DIAGNOSIS — M533 Sacrococcygeal disorders, not elsewhere classified: Secondary | ICD-10-CM | POA: Diagnosis not present

## 2015-04-19 DIAGNOSIS — R110 Nausea: Secondary | ICD-10-CM | POA: Diagnosis not present

## 2015-04-19 DIAGNOSIS — R197 Diarrhea, unspecified: Secondary | ICD-10-CM | POA: Diagnosis not present

## 2015-04-19 DIAGNOSIS — N393 Stress incontinence (female) (male): Secondary | ICD-10-CM | POA: Diagnosis not present

## 2015-04-19 DIAGNOSIS — I10 Essential (primary) hypertension: Secondary | ICD-10-CM | POA: Diagnosis not present

## 2015-04-24 DIAGNOSIS — M6281 Muscle weakness (generalized): Secondary | ICD-10-CM | POA: Diagnosis not present

## 2015-04-24 DIAGNOSIS — M533 Sacrococcygeal disorders, not elsewhere classified: Secondary | ICD-10-CM | POA: Diagnosis not present

## 2015-04-24 DIAGNOSIS — M62838 Other muscle spasm: Secondary | ICD-10-CM | POA: Diagnosis not present

## 2015-04-24 DIAGNOSIS — N3946 Mixed incontinence: Secondary | ICD-10-CM | POA: Diagnosis not present

## 2015-04-24 DIAGNOSIS — R278 Other lack of coordination: Secondary | ICD-10-CM | POA: Diagnosis not present

## 2015-05-08 DIAGNOSIS — M62838 Other muscle spasm: Secondary | ICD-10-CM | POA: Diagnosis not present

## 2015-05-08 DIAGNOSIS — N3946 Mixed incontinence: Secondary | ICD-10-CM | POA: Diagnosis not present

## 2015-05-08 DIAGNOSIS — M533 Sacrococcygeal disorders, not elsewhere classified: Secondary | ICD-10-CM | POA: Diagnosis not present

## 2015-05-08 DIAGNOSIS — M6281 Muscle weakness (generalized): Secondary | ICD-10-CM | POA: Diagnosis not present

## 2015-05-08 DIAGNOSIS — R278 Other lack of coordination: Secondary | ICD-10-CM | POA: Diagnosis not present

## 2015-05-21 DIAGNOSIS — M62838 Other muscle spasm: Secondary | ICD-10-CM | POA: Diagnosis not present

## 2015-05-21 DIAGNOSIS — R278 Other lack of coordination: Secondary | ICD-10-CM | POA: Diagnosis not present

## 2015-05-21 DIAGNOSIS — M6281 Muscle weakness (generalized): Secondary | ICD-10-CM | POA: Diagnosis not present

## 2015-05-21 DIAGNOSIS — R102 Pelvic and perineal pain: Secondary | ICD-10-CM | POA: Diagnosis not present

## 2015-05-21 DIAGNOSIS — M533 Sacrococcygeal disorders, not elsewhere classified: Secondary | ICD-10-CM | POA: Diagnosis not present

## 2015-05-22 DIAGNOSIS — R110 Nausea: Secondary | ICD-10-CM | POA: Diagnosis not present

## 2015-05-22 DIAGNOSIS — J387 Other diseases of larynx: Secondary | ICD-10-CM | POA: Diagnosis not present

## 2015-05-22 DIAGNOSIS — R0982 Postnasal drip: Secondary | ICD-10-CM | POA: Diagnosis not present

## 2015-05-30 DIAGNOSIS — R278 Other lack of coordination: Secondary | ICD-10-CM | POA: Diagnosis not present

## 2015-05-30 DIAGNOSIS — M533 Sacrococcygeal disorders, not elsewhere classified: Secondary | ICD-10-CM | POA: Diagnosis not present

## 2015-05-30 DIAGNOSIS — M62838 Other muscle spasm: Secondary | ICD-10-CM | POA: Diagnosis not present

## 2015-05-30 DIAGNOSIS — M6281 Muscle weakness (generalized): Secondary | ICD-10-CM | POA: Diagnosis not present

## 2015-05-30 DIAGNOSIS — N3946 Mixed incontinence: Secondary | ICD-10-CM | POA: Diagnosis not present

## 2015-06-20 DIAGNOSIS — R278 Other lack of coordination: Secondary | ICD-10-CM | POA: Diagnosis not present

## 2015-06-20 DIAGNOSIS — M533 Sacrococcygeal disorders, not elsewhere classified: Secondary | ICD-10-CM | POA: Diagnosis not present

## 2015-06-20 DIAGNOSIS — M62838 Other muscle spasm: Secondary | ICD-10-CM | POA: Diagnosis not present

## 2015-06-20 DIAGNOSIS — M6281 Muscle weakness (generalized): Secondary | ICD-10-CM | POA: Diagnosis not present

## 2015-06-20 DIAGNOSIS — N3946 Mixed incontinence: Secondary | ICD-10-CM | POA: Diagnosis not present

## 2015-06-25 DIAGNOSIS — J300 Vasomotor rhinitis: Secondary | ICD-10-CM | POA: Diagnosis not present

## 2015-06-25 DIAGNOSIS — R0982 Postnasal drip: Secondary | ICD-10-CM | POA: Diagnosis not present

## 2015-06-25 DIAGNOSIS — R0981 Nasal congestion: Secondary | ICD-10-CM | POA: Diagnosis not present

## 2015-06-25 DIAGNOSIS — J387 Other diseases of larynx: Secondary | ICD-10-CM | POA: Diagnosis not present

## 2015-07-01 DIAGNOSIS — M62838 Other muscle spasm: Secondary | ICD-10-CM | POA: Diagnosis not present

## 2015-07-01 DIAGNOSIS — M6281 Muscle weakness (generalized): Secondary | ICD-10-CM | POA: Diagnosis not present

## 2015-07-01 DIAGNOSIS — R102 Pelvic and perineal pain: Secondary | ICD-10-CM | POA: Diagnosis not present

## 2015-07-01 DIAGNOSIS — M533 Sacrococcygeal disorders, not elsewhere classified: Secondary | ICD-10-CM | POA: Diagnosis not present

## 2015-07-01 DIAGNOSIS — R278 Other lack of coordination: Secondary | ICD-10-CM | POA: Diagnosis not present

## 2015-07-02 DIAGNOSIS — M62838 Other muscle spasm: Secondary | ICD-10-CM | POA: Diagnosis not present

## 2015-07-02 DIAGNOSIS — R278 Other lack of coordination: Secondary | ICD-10-CM | POA: Diagnosis not present

## 2015-07-02 DIAGNOSIS — R102 Pelvic and perineal pain: Secondary | ICD-10-CM | POA: Diagnosis not present

## 2015-07-02 DIAGNOSIS — M6281 Muscle weakness (generalized): Secondary | ICD-10-CM | POA: Diagnosis not present

## 2015-07-09 DIAGNOSIS — M5136 Other intervertebral disc degeneration, lumbar region: Secondary | ICD-10-CM | POA: Diagnosis not present

## 2015-07-09 DIAGNOSIS — M179 Osteoarthritis of knee, unspecified: Secondary | ICD-10-CM | POA: Diagnosis not present

## 2015-07-09 DIAGNOSIS — M25561 Pain in right knee: Secondary | ICD-10-CM | POA: Diagnosis not present

## 2015-07-09 DIAGNOSIS — M150 Primary generalized (osteo)arthritis: Secondary | ICD-10-CM | POA: Diagnosis not present

## 2015-07-09 DIAGNOSIS — M79672 Pain in left foot: Secondary | ICD-10-CM | POA: Diagnosis not present

## 2015-07-09 DIAGNOSIS — M797 Fibromyalgia: Secondary | ICD-10-CM | POA: Diagnosis not present

## 2015-07-09 DIAGNOSIS — M79671 Pain in right foot: Secondary | ICD-10-CM | POA: Diagnosis not present

## 2015-07-10 DIAGNOSIS — M62838 Other muscle spasm: Secondary | ICD-10-CM | POA: Diagnosis not present

## 2015-07-10 DIAGNOSIS — N3946 Mixed incontinence: Secondary | ICD-10-CM | POA: Diagnosis not present

## 2015-07-10 DIAGNOSIS — R278 Other lack of coordination: Secondary | ICD-10-CM | POA: Diagnosis not present

## 2015-07-10 DIAGNOSIS — R102 Pelvic and perineal pain: Secondary | ICD-10-CM | POA: Diagnosis not present

## 2015-07-10 DIAGNOSIS — M6281 Muscle weakness (generalized): Secondary | ICD-10-CM | POA: Diagnosis not present

## 2015-07-17 DIAGNOSIS — Z79899 Other long term (current) drug therapy: Secondary | ICD-10-CM | POA: Diagnosis not present

## 2015-07-18 DIAGNOSIS — M62838 Other muscle spasm: Secondary | ICD-10-CM | POA: Diagnosis not present

## 2015-07-18 DIAGNOSIS — R102 Pelvic and perineal pain: Secondary | ICD-10-CM | POA: Diagnosis not present

## 2015-07-18 DIAGNOSIS — R278 Other lack of coordination: Secondary | ICD-10-CM | POA: Diagnosis not present

## 2015-07-18 DIAGNOSIS — M533 Sacrococcygeal disorders, not elsewhere classified: Secondary | ICD-10-CM | POA: Diagnosis not present

## 2015-07-18 DIAGNOSIS — M6281 Muscle weakness (generalized): Secondary | ICD-10-CM | POA: Diagnosis not present

## 2015-07-24 DIAGNOSIS — M150 Primary generalized (osteo)arthritis: Secondary | ICD-10-CM | POA: Diagnosis not present

## 2015-07-24 DIAGNOSIS — M5136 Other intervertebral disc degeneration, lumbar region: Secondary | ICD-10-CM | POA: Diagnosis not present

## 2015-07-24 DIAGNOSIS — M797 Fibromyalgia: Secondary | ICD-10-CM | POA: Diagnosis not present

## 2015-07-24 DIAGNOSIS — M25561 Pain in right knee: Secondary | ICD-10-CM | POA: Diagnosis not present

## 2015-07-31 DIAGNOSIS — M5136 Other intervertebral disc degeneration, lumbar region: Secondary | ICD-10-CM | POA: Diagnosis not present

## 2015-07-31 DIAGNOSIS — M150 Primary generalized (osteo)arthritis: Secondary | ICD-10-CM | POA: Diagnosis not present

## 2015-07-31 DIAGNOSIS — M25561 Pain in right knee: Secondary | ICD-10-CM | POA: Diagnosis not present

## 2015-07-31 DIAGNOSIS — M170 Bilateral primary osteoarthritis of knee: Secondary | ICD-10-CM | POA: Diagnosis not present

## 2015-07-31 DIAGNOSIS — M797 Fibromyalgia: Secondary | ICD-10-CM | POA: Diagnosis not present

## 2015-08-01 DIAGNOSIS — M533 Sacrococcygeal disorders, not elsewhere classified: Secondary | ICD-10-CM | POA: Diagnosis not present

## 2015-08-01 DIAGNOSIS — M62838 Other muscle spasm: Secondary | ICD-10-CM | POA: Diagnosis not present

## 2015-08-01 DIAGNOSIS — R102 Pelvic and perineal pain: Secondary | ICD-10-CM | POA: Diagnosis not present

## 2015-08-01 DIAGNOSIS — M6281 Muscle weakness (generalized): Secondary | ICD-10-CM | POA: Diagnosis not present

## 2015-08-01 DIAGNOSIS — M797 Fibromyalgia: Secondary | ICD-10-CM | POA: Diagnosis not present

## 2015-08-01 DIAGNOSIS — N3946 Mixed incontinence: Secondary | ICD-10-CM | POA: Diagnosis not present

## 2015-08-01 DIAGNOSIS — R278 Other lack of coordination: Secondary | ICD-10-CM | POA: Diagnosis not present

## 2015-08-01 DIAGNOSIS — K219 Gastro-esophageal reflux disease without esophagitis: Secondary | ICD-10-CM | POA: Diagnosis not present

## 2015-08-07 DIAGNOSIS — N3946 Mixed incontinence: Secondary | ICD-10-CM | POA: Diagnosis not present

## 2015-08-07 DIAGNOSIS — M797 Fibromyalgia: Secondary | ICD-10-CM | POA: Diagnosis not present

## 2015-08-07 DIAGNOSIS — R3989 Other symptoms and signs involving the genitourinary system: Secondary | ICD-10-CM | POA: Diagnosis not present

## 2015-08-07 DIAGNOSIS — M150 Primary generalized (osteo)arthritis: Secondary | ICD-10-CM | POA: Diagnosis not present

## 2015-08-07 DIAGNOSIS — M25562 Pain in left knee: Secondary | ICD-10-CM | POA: Diagnosis not present

## 2015-08-07 DIAGNOSIS — M170 Bilateral primary osteoarthritis of knee: Secondary | ICD-10-CM | POA: Diagnosis not present

## 2015-08-07 DIAGNOSIS — M5136 Other intervertebral disc degeneration, lumbar region: Secondary | ICD-10-CM | POA: Diagnosis not present

## 2015-08-07 DIAGNOSIS — R102 Pelvic and perineal pain: Secondary | ICD-10-CM | POA: Diagnosis not present

## 2015-08-07 DIAGNOSIS — N3010 Interstitial cystitis (chronic) without hematuria: Secondary | ICD-10-CM | POA: Diagnosis not present

## 2015-08-20 DIAGNOSIS — N760 Acute vaginitis: Secondary | ICD-10-CM | POA: Diagnosis not present

## 2015-08-20 DIAGNOSIS — Z1231 Encounter for screening mammogram for malignant neoplasm of breast: Secondary | ICD-10-CM | POA: Diagnosis not present

## 2015-08-20 DIAGNOSIS — Z01419 Encounter for gynecological examination (general) (routine) without abnormal findings: Secondary | ICD-10-CM | POA: Diagnosis not present

## 2015-08-20 DIAGNOSIS — Z6821 Body mass index (BMI) 21.0-21.9, adult: Secondary | ICD-10-CM | POA: Diagnosis not present

## 2015-09-05 DIAGNOSIS — R87619 Unspecified abnormal cytological findings in specimens from cervix uteri: Secondary | ICD-10-CM | POA: Diagnosis not present

## 2015-09-05 DIAGNOSIS — N950 Postmenopausal bleeding: Secondary | ICD-10-CM | POA: Diagnosis not present

## 2015-09-12 DIAGNOSIS — M797 Fibromyalgia: Secondary | ICD-10-CM | POA: Diagnosis not present

## 2015-09-24 DIAGNOSIS — R87619 Unspecified abnormal cytological findings in specimens from cervix uteri: Secondary | ICD-10-CM | POA: Diagnosis not present

## 2015-09-24 DIAGNOSIS — N950 Postmenopausal bleeding: Secondary | ICD-10-CM | POA: Diagnosis not present

## 2015-10-03 DIAGNOSIS — R6889 Other general symptoms and signs: Secondary | ICD-10-CM | POA: Diagnosis not present

## 2015-10-03 DIAGNOSIS — R509 Fever, unspecified: Secondary | ICD-10-CM | POA: Diagnosis not present

## 2015-10-07 DIAGNOSIS — N390 Urinary tract infection, site not specified: Secondary | ICD-10-CM | POA: Diagnosis not present

## 2015-10-07 DIAGNOSIS — D72829 Elevated white blood cell count, unspecified: Secondary | ICD-10-CM | POA: Diagnosis not present

## 2015-10-07 DIAGNOSIS — R509 Fever, unspecified: Secondary | ICD-10-CM | POA: Diagnosis not present

## 2015-10-07 DIAGNOSIS — R6889 Other general symptoms and signs: Secondary | ICD-10-CM | POA: Diagnosis not present

## 2015-10-22 DIAGNOSIS — N390 Urinary tract infection, site not specified: Secondary | ICD-10-CM | POA: Diagnosis not present

## 2015-10-22 DIAGNOSIS — K219 Gastro-esophageal reflux disease without esophagitis: Secondary | ICD-10-CM | POA: Diagnosis not present

## 2015-10-22 DIAGNOSIS — R5383 Other fatigue: Secondary | ICD-10-CM | POA: Diagnosis not present

## 2015-10-22 DIAGNOSIS — I10 Essential (primary) hypertension: Secondary | ICD-10-CM | POA: Diagnosis not present

## 2015-11-05 DIAGNOSIS — R5383 Other fatigue: Secondary | ICD-10-CM | POA: Diagnosis not present

## 2015-11-05 DIAGNOSIS — K219 Gastro-esophageal reflux disease without esophagitis: Secondary | ICD-10-CM | POA: Diagnosis not present

## 2015-11-05 DIAGNOSIS — N390 Urinary tract infection, site not specified: Secondary | ICD-10-CM | POA: Diagnosis not present

## 2015-11-05 DIAGNOSIS — R42 Dizziness and giddiness: Secondary | ICD-10-CM | POA: Diagnosis not present

## 2015-11-14 DIAGNOSIS — R5383 Other fatigue: Secondary | ICD-10-CM | POA: Diagnosis not present

## 2015-11-14 DIAGNOSIS — K219 Gastro-esophageal reflux disease without esophagitis: Secondary | ICD-10-CM | POA: Diagnosis not present

## 2015-11-14 DIAGNOSIS — T368X5A Adverse effect of other systemic antibiotics, initial encounter: Secondary | ICD-10-CM | POA: Diagnosis not present

## 2015-11-25 DIAGNOSIS — E7800 Pure hypercholesterolemia, unspecified: Secondary | ICD-10-CM | POA: Diagnosis not present

## 2015-11-25 DIAGNOSIS — Z0000 Encounter for general adult medical examination without abnormal findings: Secondary | ICD-10-CM | POA: Diagnosis not present

## 2015-11-28 DIAGNOSIS — L659 Nonscarring hair loss, unspecified: Secondary | ICD-10-CM | POA: Diagnosis not present

## 2015-11-28 DIAGNOSIS — M546 Pain in thoracic spine: Secondary | ICD-10-CM | POA: Diagnosis not present

## 2015-11-28 DIAGNOSIS — R52 Pain, unspecified: Secondary | ICD-10-CM | POA: Diagnosis not present

## 2015-11-28 DIAGNOSIS — R7989 Other specified abnormal findings of blood chemistry: Secondary | ICD-10-CM | POA: Diagnosis not present

## 2015-11-28 DIAGNOSIS — I10 Essential (primary) hypertension: Secondary | ICD-10-CM | POA: Diagnosis not present

## 2015-12-06 DIAGNOSIS — M545 Low back pain: Secondary | ICD-10-CM | POA: Diagnosis not present

## 2015-12-10 DIAGNOSIS — H903 Sensorineural hearing loss, bilateral: Secondary | ICD-10-CM | POA: Diagnosis not present

## 2015-12-31 DIAGNOSIS — M170 Bilateral primary osteoarthritis of knee: Secondary | ICD-10-CM | POA: Diagnosis not present

## 2015-12-31 DIAGNOSIS — M79604 Pain in right leg: Secondary | ICD-10-CM | POA: Diagnosis not present

## 2015-12-31 DIAGNOSIS — R251 Tremor, unspecified: Secondary | ICD-10-CM | POA: Diagnosis not present

## 2016-01-08 ENCOUNTER — Ambulatory Visit (INDEPENDENT_AMBULATORY_CARE_PROVIDER_SITE_OTHER): Payer: Medicare Other | Admitting: Neurology

## 2016-01-08 ENCOUNTER — Encounter: Payer: Self-pay | Admitting: Neurology

## 2016-01-08 VITALS — BP 142/76 | HR 72 | Resp 14 | Ht 61.0 in | Wt 119.0 lb

## 2016-01-08 DIAGNOSIS — G250 Essential tremor: Secondary | ICD-10-CM | POA: Diagnosis not present

## 2016-01-08 NOTE — Progress Notes (Addendum)
Subjective:    Patient ID: [name] is a 75 y.o. female.  HPI     Star Age, MD, PhD [facility] [address], [address] P.O. [place], [place]  [address]  Dear Dr. Shelia Media,   I saw your patient, [name], upon your kind request in my neurologic clinic today for initial consultation of a facial tremor. The patient is unaccompanied today. As you know, Ms. [name] is a very pleasant 75 year old right-handed woman with an underlying medical history of osteoarthritis, allergic rhinitis, fibromyalgia, hyperlipidemia, osteopenia, left bundle branch block, tinnitus, hearing loss, hypertension, and recurrent headaches, who reports intermittent facial trembling, typically first thing in the morning, intermittently for the past months, since April or so and bilateral arm tremors and hand tremors intermittently as well. I reviewed your office note from 12/31/2015, which you kindly included. She was noted to have a slight voice tremor. She reported no other trembling elsewhere. She denied restless leg symptoms.   She reports that she was treated for the flu and UTI around Easter and was first treated with ampicillin and then with Cipro. After she stopped the antibiotics she noted weakness and trembling, affecting both upper and lower extremities and the neck and jaw.   She has knee pain b/l and has received injections into the L knee.   She has a FHx of tremors in her sister who has a tremor of her hands or right hand. Sister has trouble feeding herself d/t tremor. Patient reports that sister is several years older. Sister's husband had Parkinson's disease and patient worries a little bit about that as well. She has issues with interstitial cystitis. She has been on physical therapy for this. She sees Dr. Roni Bread for this. She is trying to drink enough water and follow through with the exercises at home as well.  She thinks that her balance is not as good. She took a  fall when she was still dealing with her flu and UTI issues. This was at home and she fell against the dresser.  Patient is a non-smoker, does not drink alcohol and has very little caffeine, in the form of jasmine tea.   Addendum, 5:30 PM: I reviewed blood test results from 11/28/2015: BMP showed creatinine of 1.2, glucose 64, otherwise unremarkable, CK level normal at 42, ESR normal at 15, lipid panel showed total cholesterol of 202, LDL 134, triglycerides 118. TSH on 04/01/2015 was 3.64. Vitamin B12 on 07/16/2014 was 501, magnesium 2.1.  Her Past Medical History Is Significant For: Past Medical History  Diagnosis Date  . Hypertension   . Chronic headaches   . Tinnitus   . Fibromyalgia   . Interstitial cystitis   . Hyperlipemia   . LBBB (left bundle branch block)   . Lactose intolerance   . Arthritis   . Gallstones   . Pneumonia   . Allergy     Her Past Surgical History Is Significant For: Past Surgical History  Procedure Laterality Date  . Colonoscopy  2009    Dr. Raford Pitcher   . Cholecystectomy  03/2010  . Tonsillectomy    . Cystoscopy      Multiple Cystoscopies with stents or biopsy     Her Family History Is Significant For: Family History  Problem Relation Age of Onset  . Heart disease Mother   . Heart disease Father   . Stroke Father   . Stroke Sister     Her Social History Is Significant For: Social History   Social  History  . Marital Status: Married    Spouse Name: N/A  . Number of Children: 2  . Years of Education: college   Occupational History  . housewife    Social History Main Topics  . Smoking status: Never Smoker   . Smokeless tobacco: Never Used  . Alcohol Use: No  . Drug Use: No  . Sexual Activity: Not Asked   Other Topics Concern  . None   Social History Narrative   1 cup of Jasmine tea daily     Her Allergies Are:  Allergies  Allergen Reactions  . Persantine [Dipyridamole] Other (See Comments)    Migraines   .  Esomeprazole Other (See Comments)    Jittery, sore throat  . Irbesartan Diarrhea  . Levothyroxine   . Losartan Other (See Comments)    Hot and chills  . Maxzide [Hydrochlorothiazide W-Triamterene] Other (See Comments)    Cramps   . Micardis [Telmisartan] Other (See Comments)    headache  . Butrans [Buprenorphine] Other (See Comments)    "knocks me out"  . Codeine Nausea Only  . Cymbalta [Duloxetine Hcl] Other (See Comments)    "My body doesn't metabolize it anymore."  . Doxycycline Rash  . Lactose Intolerance (Gi) Other (See Comments)    GI upset   . Nortriptyline Hcl Other (See Comments)    "My body doesn't metabolize it anymore."  . Oxycontin [Oxycodone Hcl] Other (See Comments)    Interstitial cystitis   . Pennsaid [Diclofenac Sodium] Hives  . Tramadol Itching    sedation  :   Her Current Medications Are:  Outpatient Encounter Prescriptions as of 01/08/2016  Medication Sig  . calcium gluconate 500 MG tablet Take 1 tablet by mouth 2 (two) times daily.  . cetirizine (ZYRTEC) 10 MG tablet Take 10 mg by mouth daily.  . Cholecalciferol (VITAMIN D-3) 1000 UNITS CAPS Take 1 capsule by mouth daily.  . cyclobenzaprine (FLEXERIL) 10 MG tablet Take 10 mg by mouth daily as needed.  . Digestive Enzymes (ENZYME DIGEST PO) Take by mouth.  . DULoxetine (CYMBALTA) 30 MG capsule   . ELMIRON 100 MG capsule Take 100 mg by mouth daily.  Marland Kitchen HYDROcodone-acetaminophen (NORCO/VICODIN) 5-325 MG per tablet Take 0.5 tablets by mouth as needed.   . Lactobacillus (MORE-DOPHILUS ACIDOPHILUS PO) Take by mouth.  . nitrofurantoin (MACRODANTIN) 100 MG capsule Take 100 mg by mouth as needed.  . ondansetron (ZOFRAN) 4 MG tablet Take 1 tab every 6 hours as needed for nausea.  . phenazopyridine (PYRIDIUM) 200 MG tablet Take 200 mg by mouth as needed for pain.  . ranitidine (ZANTAC) 150 MG capsule Take 150 mg by mouth 2 (two) times daily.  . [DISCONTINUED] Coenzyme Q10 (CO Q 10) 100 MG CAPS Take 1 capsule by  mouth daily.  . [DISCONTINUED] estropipate (OGEN) 1.5 MG tablet Take 0.75 mg by mouth as directed.   . [DISCONTINUED] gabapentin (NEURONTIN) 100 MG capsule Take 100 mg by mouth at bedtime.  . [DISCONTINUED] Glucosamine 500 MG CAPS Take 1 capsule by mouth daily.  . [DISCONTINUED] mometasone (NASONEX) 50 MCG/ACT nasal spray Place 2 sprays into the nose daily.  . [DISCONTINUED] montelukast (SINGULAIR) 10 MG tablet Take 10 mg by mouth at bedtime.   . [DISCONTINUED] pantoprazole (PROTONIX) 40 MG tablet Take 40 mg by mouth daily.  . [DISCONTINUED] predniSONE (DELTASONE) 5 MG tablet Take 2.5 mg by mouth daily.   . [DISCONTINUED] progesterone (PROMETRIUM) 100 MG capsule Take 100 mg by mouth daily. First 10 days  of every other month   No facility-administered encounter medications on file as of 01/08/2016.  :   Review of Systems:  Out of a complete 14 point review of systems, all are reviewed and negative with the exception of these symptoms as listed below:   Review of Systems  Constitutional: Positive for fatigue.  Neurological: Positive for tremors, weakness and numbness.       Patient states that around [place] this year she had UTI and the Flu. Patient noticed 1 week after stopping the Cipro she started having symptoms of weakness and shaking in legs, arm, and jaw.     Objective:  Neurologic Exam  Physical Exam Physical Examination:   Filed Vitals:   01/08/16 1119  BP: 142/76  Pulse: 72  Resp: 14    General Examination: The patient is a very pleasant 75 y.o. female in no acute distress. She appears well-developed and well-nourished and well groomed.   HEENT: Normocephalic, atraumatic, pupils are equal, round and reactive to light and accommodation. Funduscopic exam is normal with sharp disc margins noted. Extraocular tracking is good without limitation to gaze excursion or nystagmus noted. Normal smooth pursuit is noted. Hearing is grossly intact. Face is symmetric with normal facial  animation and normal facial sensation. Speech is clear with no dysarthria noted. There is no hypophonia. She has a very mild intermittent lower jaw tremor, slight voice tremor, intermittent side-to-side head tremor. Neck is supple with full range of passive and active motion. There are no carotid bruits on auscultation. Oropharynx exam reveals: mild mouth dryness, good dental hygiene and no significant airway crowding. Tongue protrudes centrally and palate elevates symmetrically.  Chest: Clear to auscultation without wheezing, rhonchi or crackles noted.  Heart: S1+S2+0, regular and normal without murmurs, rubs or gallops noted.   Abdomen: Soft, non-tender and non-distended with normal bowel sounds appreciated on auscultation.  Extremities: There is no pitting edema in the distal lower extremities bilaterally. Pedal pulses are intact.  Skin: Warm and dry without trophic changes noted. There are no varicose veins.  Musculoskeletal: exam reveals no obvious joint deformities, tenderness or joint swelling or erythema.   Neurologically:  Mental status: The patient is awake, alert and oriented in all 4 spheres. Her immediate and remote memory, attention, language skills and fund of knowledge are appropriate. There is no evidence of aphasia, agnosia, apraxia or anomia. Speech is clear with normal prosody and enunciation. Thought process is linear. Mood is normal and affect is normal.  Cranial nerves II - XII are as described above under HEENT exam. In addition: shoulder shrug is normal with equal shoulder height noted. Motor exam: Normal bulk, strength and tone is noted. There is no resting tremor or rebound. On Archimedes spiral drawing she has very mild tremulousness with the left hand. Handwriting is not tremulous, legible and not micrographic. She has a very slight postural tremor in the left hand only. No action tremor. No intention tremor. Romberg is negative. Reflexes are 2+ throughout. Fine motor  skills and coordination: intact with normal finger taps, normal hand movements, normal rapid alternating patting, normal foot taps and normal foot agility.  Cerebellar testing: No dysmetria or intention tremor on finger to nose testing. Heel to shin is unremarkable bilaterally. There is no truncal or gait ataxia.  Sensory exam: intact  in the upper and lower extremities. She stands up without difficulty. She can stand narrow based. Tandem walk is unremarkable. She has preserved arm swing when walking and turns fine. She has no  significant balance issues. She has normal stride length and pace, no shuffling noted.               Assessment and Plan:   In summary, Enas Winchel is a very pleasant 75 y.o.-year old female with an underlying medical history of osteoarthritis, allergic rhinitis, fibromyalgia, hyperlipidemia, osteopenia, left bundle branch block, tinnitus, hearing loss, hypertension, and recurrent headaches, who presents for initial consultation for recent onset tremors affecting her head and neck area, lips and jaw area, as well as upper extremities. She feels that her symptoms started after an illness. She was on 2 different types of antibiotics. 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 She has no overt signs of parkinsonism and her history is not suggestive of parkinsonism. Thankfully, otherwise her exam is nonfocal and benign. Tremor itself is also minimal. Therefore, I suggested that we monitor her symptoms and her exam. I did talk to her about potentially utilizing symptomatic treatment for tremors. She is advised to stay well-hydrated, active mentally and physically, and well rested. Furthermore, she may have had blood work last month in your office, we will request test results. She reports that her thyroid function was off last year and  she was tried on Synthroid generic but could not tolerate it and was taken off of it. If she has not had a TSH or thyroid function tested recently, please have it checked next time. I will wait out test results from your office and we mutually agreed to forego blood work today. I answered all her questions today and suggested a six-month follow-up. The patient was in agreement.  Thank you very much for allowing me to participate in the care of this nice patient. If I can be of any further assistance to you please do not hesitate to call me at [phone].  Sincerely,   Star Age, MD, PhD

## 2016-01-08 NOTE — Patient Instructions (Addendum)
I think you have a mild degree of head and neck, slight voice tremor and findings are rather mild. Exam is otherwise benign which is reassuring.  I think we will monitor your exam and your symptoms.  We will check if you had a thyroid test in the last 6 months.

## 2016-01-14 DIAGNOSIS — M797 Fibromyalgia: Secondary | ICD-10-CM | POA: Diagnosis not present

## 2016-01-14 DIAGNOSIS — M79604 Pain in right leg: Secondary | ICD-10-CM | POA: Diagnosis not present

## 2016-01-14 DIAGNOSIS — M150 Primary generalized (osteo)arthritis: Secondary | ICD-10-CM | POA: Diagnosis not present

## 2016-01-14 DIAGNOSIS — M79605 Pain in left leg: Secondary | ICD-10-CM | POA: Diagnosis not present

## 2016-01-14 DIAGNOSIS — M170 Bilateral primary osteoarthritis of knee: Secondary | ICD-10-CM | POA: Diagnosis not present

## 2016-01-14 DIAGNOSIS — M5136 Other intervertebral disc degeneration, lumbar region: Secondary | ICD-10-CM | POA: Diagnosis not present

## 2016-02-25 DIAGNOSIS — L720 Epidermal cyst: Secondary | ICD-10-CM | POA: Diagnosis not present

## 2016-02-25 DIAGNOSIS — D485 Neoplasm of uncertain behavior of skin: Secondary | ICD-10-CM | POA: Diagnosis not present

## 2016-02-25 DIAGNOSIS — L821 Other seborrheic keratosis: Secondary | ICD-10-CM | POA: Diagnosis not present

## 2016-02-25 DIAGNOSIS — D225 Melanocytic nevi of trunk: Secondary | ICD-10-CM | POA: Diagnosis not present

## 2016-02-25 DIAGNOSIS — D224 Melanocytic nevi of scalp and neck: Secondary | ICD-10-CM | POA: Diagnosis not present

## 2016-02-25 DIAGNOSIS — D2211 Melanocytic nevi of right eyelid, including canthus: Secondary | ICD-10-CM | POA: Diagnosis not present

## 2016-04-15 DIAGNOSIS — Z23 Encounter for immunization: Secondary | ICD-10-CM | POA: Diagnosis not present

## 2016-05-06 DIAGNOSIS — M170 Bilateral primary osteoarthritis of knee: Secondary | ICD-10-CM | POA: Diagnosis not present

## 2016-05-06 DIAGNOSIS — M5136 Other intervertebral disc degeneration, lumbar region: Secondary | ICD-10-CM | POA: Diagnosis not present

## 2016-05-06 DIAGNOSIS — M797 Fibromyalgia: Secondary | ICD-10-CM | POA: Diagnosis not present

## 2016-05-06 DIAGNOSIS — M150 Primary generalized (osteo)arthritis: Secondary | ICD-10-CM | POA: Diagnosis not present

## 2016-06-26 ENCOUNTER — Emergency Department (HOSPITAL_BASED_OUTPATIENT_CLINIC_OR_DEPARTMENT_OTHER): Payer: Medicare Other

## 2016-06-26 ENCOUNTER — Encounter (HOSPITAL_BASED_OUTPATIENT_CLINIC_OR_DEPARTMENT_OTHER): Payer: Self-pay | Admitting: *Deleted

## 2016-06-26 ENCOUNTER — Emergency Department (HOSPITAL_BASED_OUTPATIENT_CLINIC_OR_DEPARTMENT_OTHER)
Admission: EM | Admit: 2016-06-26 | Discharge: 2016-06-26 | Disposition: A | Discharge status: Home / Self Care | Payer: Medicare Other | Attending: Emergency Medicine | Admitting: Emergency Medicine

## 2016-06-26 DIAGNOSIS — R079 Chest pain, unspecified: Secondary | ICD-10-CM | POA: Insufficient documentation

## 2016-06-26 DIAGNOSIS — Z79899 Other long term (current) drug therapy: Secondary | ICD-10-CM | POA: Insufficient documentation

## 2016-06-26 DIAGNOSIS — N10 Acute tubulo-interstitial nephritis: Secondary | ICD-10-CM | POA: Diagnosis not present

## 2016-06-26 DIAGNOSIS — R1084 Generalized abdominal pain: Secondary | ICD-10-CM | POA: Diagnosis not present

## 2016-06-26 DIAGNOSIS — R112 Nausea with vomiting, unspecified: Secondary | ICD-10-CM

## 2016-06-26 DIAGNOSIS — K5900 Constipation, unspecified: Secondary | ICD-10-CM

## 2016-06-26 DIAGNOSIS — I10 Essential (primary) hypertension: Secondary | ICD-10-CM | POA: Insufficient documentation

## 2016-06-26 DIAGNOSIS — R1030 Lower abdominal pain, unspecified: Secondary | ICD-10-CM | POA: Diagnosis present

## 2016-06-26 LAB — COMPREHENSIVE METABOLIC PANEL
ALT: 14 U/L (ref 14–54)
AST: 23 U/L (ref 15–41)
Albumin: 4.5 g/dL (ref 3.5–5.0)
Alkaline Phosphatase: 51 U/L (ref 38–126)
Anion gap: 10 (ref 5–15)
BUN: 19 mg/dL (ref 6–20)
CO2: 27 mmol/L (ref 22–32)
Calcium: 10.4 mg/dL — ABNORMAL HIGH (ref 8.9–10.3)
Chloride: 103 mmol/L (ref 101–111)
Creatinine, Ser: 1.2 mg/dL — ABNORMAL HIGH (ref 0.44–1.00)
GFR calc Af Amer: 50 mL/min — ABNORMAL LOW (ref >60)
GFR calc non Af Amer: 43 mL/min — ABNORMAL LOW (ref >60)
Glucose, Bld: 166 mg/dL — ABNORMAL HIGH (ref 65–99)
Potassium: 3.6 mmol/L (ref 3.5–5.1)
Sodium: 140 mmol/L (ref 135–145)
Total Bilirubin: 0.6 mg/dL (ref 0.3–1.2)
Total Protein: 7.4 g/dL (ref 6.5–8.1)

## 2016-06-26 LAB — URINALYSIS, ROUTINE W REFLEX MICROSCOPIC
Glucose, UA: NEGATIVE mg/dL
Hgb urine dipstick: NEGATIVE
Ketones, ur: NEGATIVE mg/dL
Nitrite: NEGATIVE
Protein, ur: NEGATIVE mg/dL
Specific Gravity, Urine: 1.021 (ref 1.005–1.030)
pH: 5 (ref 5.0–8.0)

## 2016-06-26 LAB — CBC WITH DIFFERENTIAL/PLATELET
Basophils Absolute: 0 10*3/uL (ref 0.0–0.1)
Basophils Relative: 0 %
Eosinophils Absolute: 0 10*3/uL (ref 0.0–0.7)
Eosinophils Relative: 0 %
HCT: 45.7 % (ref 36.0–46.0)
Hemoglobin: 14.9 g/dL (ref 12.0–15.0)
Lymphocytes Relative: 4 %
Lymphs Abs: 0.6 10*3/uL — ABNORMAL LOW (ref 0.7–4.0)
MCH: 30.3 pg (ref 26.0–34.0)
MCHC: 32.6 g/dL (ref 30.0–36.0)
MCV: 93.1 fL (ref 78.0–100.0)
Monocytes Absolute: 0.5 10*3/uL (ref 0.1–1.0)
Monocytes Relative: 4 %
Neutro Abs: 13.8 10*3/uL — ABNORMAL HIGH (ref 1.7–7.7)
Neutrophils Relative %: 92 %
Platelets: 194 10*3/uL (ref 150–400)
RBC: 4.91 MIL/uL (ref 3.87–5.11)
RDW: 13.8 % (ref 11.5–15.5)
WBC: 15 10*3/uL — ABNORMAL HIGH (ref 4.0–10.5)

## 2016-06-26 LAB — URINALYSIS, MICROSCOPIC (REFLEX)
RBC / HPF: NONE SEEN RBC/hpf (ref 0–5)
Squamous Epithelial / LPF: NONE SEEN
WBC, UA: NONE SEEN WBC/hpf (ref 0–5)

## 2016-06-26 LAB — TROPONIN I: Troponin I: <0.03 ng/mL (ref <0.03)

## 2016-06-26 LAB — LIPASE, BLOOD: Lipase: 34 U/L (ref 11–51)

## 2016-06-26 MED ORDER — ONDANSETRON HCL 4 MG/2ML IJ SOLN
4.0000 mg | Freq: Once | INTRAMUSCULAR | Status: AC
  Administered 2016-06-26: 4 mg via INTRAVENOUS

## 2016-06-26 MED ORDER — CEPHALEXIN 500 MG PO CAPS: 500.0000 mg | ORAL_CAPSULE | Freq: Three times a day (TID) | ORAL | 0 refills | Status: AC

## 2016-06-26 MED ORDER — ONDANSETRON 4 MG PO TBDP: 4.0000 mg | ORAL_TABLET | Freq: Three times a day (TID) | ORAL | 0 refills | Status: DC | PRN

## 2016-06-26 MED ORDER — IOPAMIDOL (ISOVUE-300) INJECTION 61%
80.0000 mL | Freq: Once | INTRAVENOUS | Status: AC | PRN
  Administered 2016-06-26: 80 mL via INTRAVENOUS

## 2016-06-26 MED ORDER — MORPHINE SULFATE (PF) 2 MG/ML IV SOLN
2.0000 mg | Freq: Once | INTRAVENOUS | Status: AC
  Administered 2016-06-26: 2 mg via INTRAVENOUS
  Filled 2016-06-26: qty 1

## 2016-06-26 MED ORDER — ONDANSETRON HCL 4 MG/2ML IJ SOLN
4.0000 mg | Freq: Once | INTRAMUSCULAR | Status: AC
Start: 2016-06-26 — End: 2016-06-26
  Administered 2016-06-26: 4 mg via INTRAVENOUS
  Filled 2016-06-26: qty 2

## 2016-06-26 MED ORDER — ACETAMINOPHEN 500 MG PO TABS
1000.0000 mg | ORAL_TABLET | Freq: Once | ORAL | Status: AC
  Administered 2016-06-26: 1000 mg via ORAL
  Filled 2016-06-26: qty 2

## 2016-06-26 MED ORDER — LIDOCAINE HCL 2 % EX GEL
1.0000 "application " | Freq: Once | CUTANEOUS | Status: AC
  Administered 2016-06-26: 1 via TOPICAL
  Filled 2016-06-26: qty 20

## 2016-06-26 MED ORDER — DICYCLOMINE HCL 20 MG PO TABS: 20.0000 mg | ORAL_TABLET | Freq: Two times a day (BID) | ORAL | 0 refills | Status: DC | PRN

## 2016-06-26 MED ORDER — ONDANSETRON HCL 4 MG/2ML IJ SOLN
INTRAMUSCULAR | Status: AC
  Filled 2016-06-26: qty 2

## 2016-06-26 MED ORDER — SODIUM CHLORIDE 0.9 % IV BOLUS (SEPSIS)
1000.0000 mL | Freq: Once | INTRAVENOUS | Status: AC
  Administered 2016-06-26: 1000 mL via INTRAVENOUS

## 2016-06-26 MED FILL — CEPHALEXIN 500 MG CAPSULE: 500 | 14 days supply | Qty: 42 | Fill #0

## 2016-06-26 MED FILL — DICYCLOMINE 20 MG TABLET: 20 | 10 days supply | Qty: 20 | Fill #0

## 2016-06-26 MED FILL — ONDANSETRON ODT 4 MG TABLET: 4 | 7 days supply | Qty: 20 | Fill #0

## 2016-06-26 NOTE — ED Notes (Signed)
EKG placed on Dr. Will Bonnet keyboard. Provider in a different room.

## 2016-06-26 NOTE — ED Notes (Signed)
Complains of vomiting that started at 0800 and epigastric pain 5/10.  Sl pale, alert and oriented  States took 2 dulcolax last pm for contipation  And has had 3 small hard stools this am.  States her epigastric pain feels like her usual indigestion pain

## 2016-06-26 NOTE — ED Notes (Signed)
Patient transported to CT 

## 2016-06-26 NOTE — ED Triage Notes (Signed)
Patient is alert and oriented x4.  She is complaining abdominal pain with nausea and vomiting bile.  Patient adds that she is also constipated and to dulcolax last night and has had two hard BM this morning.

## 2016-06-26 NOTE — ED Notes (Signed)
Patient given gingerale for PO challenge per MD 

## 2016-06-26 NOTE — Discharge Instructions (Signed)
Recommend miralax for constipation, may take 4-8caps of miralax in 32oz of gatorade or similar on day one, followed by decreasing amounts of miralax on subsequent days until you have soft bowel movements.  You may also try over the counter glycerin suppository for constipation.

## 2016-06-26 NOTE — ED Notes (Signed)
ED Provider at bedside. 

## 2016-06-26 NOTE — ED Provider Notes (Signed)
[facility] Provider Note   CSN: [id number] Arrival date & time: 06/26/16  1107     History   Chief Complaint Chief Complaint  Patient presents with  . Emesis    HPI [name] is a 76 y.o. female.  HPI   Early this AM had some severe pain in lower chest/upper abdomen as well as cramping in lower abdomen Dull, aching pain Prelief helped the pain some, but nothing made it worse, has not had the pain since taking prelief except when vomiting Began vomiting and did over 10-20 times, no blood in emesis, at first was stomach contents then became green Now not having abd pain but pain from emesis/dry heaves Constipated, had hard stools yesterday, took dulcolax last night and had 4-5 hard stools this AM, small hard stool Not passing flatus No urinary symptoms, no fevers Son in Sports coach with emesis/diarrhea   Past Medical History:  Diagnosis Date  . Allergy   . Arthritis   . Chronic headaches   . Fibromyalgia   . Gallstones   . Hyperlipemia   . Hypertension   . Interstitial cystitis   . Lactose intolerance   . LBBB (left bundle branch block)   . Osteoarthritis   . Pneumonia   . Tinnitus     Patient Active Problem List   Diagnosis Date Noted  . Nausea without vomiting 03/19/2014  . Bowel habit changes 03/19/2014  . Lumbar back pain with radiculopathy affecting right lower extremity 01/08/2014  . Dizzinesses 01/08/2014  . Knee pain 03/28/2012    Past Surgical History:  Procedure Laterality Date  . CHOLECYSTECTOMY  03/2010  . COLONOSCOPY  2009   Dr. Raford Pitcher   . CYSTOSCOPY     Multiple Cystoscopies with stents or biopsy   . TONSILLECTOMY      OB History    No data available       Home Medications    Prior to Admission medications   Medication Sig Start Date End Date Taking? Authorizing Provider  predniSONE (DELTASONE) 5 MG tablet Take 5 mg by mouth 3 (three) times a week.   Yes Historical Provider, MD  calcium gluconate 500 MG  tablet Take 1 tablet by mouth 2 (two) times daily.    Historical Provider, MD  cephALEXin (KEFLEX) 500 MG capsule Take 1 capsule (500 mg total) by mouth 3 (three) times daily. 06/26/16 07/10/16  Gareth Morgan, MD  cetirizine (ZYRTEC) 10 MG tablet Take 10 mg by mouth daily.    Historical Provider, MD  Cholecalciferol (VITAMIN D-3) 1000 UNITS CAPS Take 1 capsule by mouth daily.    Historical Provider, MD  cyclobenzaprine (FLEXERIL) 10 MG tablet Take 10 mg by mouth daily as needed. 12/23/11   Historical Provider, MD  dicyclomine (BENTYL) 20 MG tablet Take 1 tablet (20 mg total) by mouth 2 (two) times daily as needed for spasms. 06/26/16   Gareth Morgan, MD  Digestive Enzymes (ENZYME DIGEST PO) Take by mouth.    Historical Provider, MD  DULoxetine (CYMBALTA) 30 MG capsule  12/23/15   Historical Provider, MD  ELMIRON 100 MG capsule Take 100 mg by mouth daily. 03/01/12   Historical Provider, MD  HYDROcodone-acetaminophen (NORCO/VICODIN) 5-325 MG per tablet Take 0.5 tablets by mouth as needed.  10/10/13   Historical Provider, MD  Lactobacillus (MORE-DOPHILUS ACIDOPHILUS PO) Take by mouth.    Historical Provider, MD  nitrofurantoin (MACRODANTIN) 100 MG capsule Take 100 mg by mouth as needed.    Historical Provider, MD  ondansetron [facility]  ODT) 4 MG disintegrating tablet Take 1 tablet (4 mg total) by mouth every 8 (eight) hours as needed for nausea or vomiting. 06/26/16   Gareth Morgan, MD  ondansetron (ZOFRAN) 4 MG tablet Take 1 tab every 6 hours as needed for nausea. 04/16/14   Willia Craze, NP  phenazopyridine (PYRIDIUM) 200 MG tablet Take 200 mg by mouth as needed for pain.    Historical Provider, MD  ranitidine (ZANTAC) 150 MG capsule Take 150 mg by mouth 2 (two) times daily.    Historical Provider, MD    Family History Family History  Problem Relation Age of Onset  . Heart disease Mother   . Heart disease Father   . Stroke Father   . Stroke Sister     Social History Social History  Substance  Use Topics  . Smoking status: Never Smoker  . Smokeless tobacco: Never Used  . Alcohol use No     Allergies   Persantine [dipyridamole]; Esomeprazole; Irbesartan; Levothyroxine; Losartan; Maxzide [hydrochlorothiazide w-triamterene]; Micardis [telmisartan]; Butrans [buprenorphine]; Codeine; Cymbalta [duloxetine hcl]; Doxycycline; Lactose intolerance (gi); Nortriptyline hcl; Oxycontin [oxycodone hcl]; Pennsaid [diclofenac sodium]; and Tramadol   Review of Systems Review of Systems  Constitutional: Negative for fever.  HENT: Negative for sore throat.   Eyes: Negative for visual disturbance.  Respiratory: Negative for cough and shortness of breath.   Cardiovascular: Positive for chest pain (was radiating from abdomen to chest but is now improved).  Gastrointestinal: Positive for abdominal distention, abdominal pain, constipation, nausea and vomiting. Negative for diarrhea.  Genitourinary: Positive for flank pain (r). Negative for difficulty urinating.  Musculoskeletal: Negative for back pain and neck pain.  Skin: Negative for rash.  Neurological: Negative for syncope and headaches.     Physical Exam Updated Vital Signs BP 179/70 (BP Location: Right Arm)   Pulse 68   Temp 97.5 F (36.4 C) (Oral)   Resp 16   Ht 5\' 3"  (1.6 m)   Wt 121 lb (54.9 kg)   SpO2 100%   BMI 21.43 kg/m   Physical Exam  Constitutional: She is oriented to person, place, and time. She appears well-developed and well-nourished. No distress.  HENT:  Head: Normocephalic and atraumatic.  Eyes: Conjunctivae and EOM are normal.  Neck: Normal range of motion.  Cardiovascular: Normal rate, regular rhythm, normal heart sounds and intact distal pulses.  Exam reveals no gallop and no friction rub.   No murmur heard. Pulmonary/Chest: Effort normal and breath sounds normal. No respiratory distress. She has no wheezes. She has no rales.  Abdominal: Soft. She exhibits no distension. There is tenderness (diffuse worse in  LLQ). There is no guarding.  Right CVA tenderness  Musculoskeletal: She exhibits no edema or tenderness.  Neurological: She is alert and oriented to person, place, and time.  Skin: Skin is warm and dry. No rash noted. She is not diaphoretic. No erythema.  Nursing note and vitals reviewed.    ED Treatments / Results  Labs (all labs ordered are listed, but only abnormal results are displayed) Labs Reviewed  CBC WITH DIFFERENTIAL/PLATELET - Abnormal; Notable for the following:       Result Value   WBC 15.0 (*)    Neutro Abs 13.8 (*)    Lymphs Abs 0.6 (*)    All other components within normal limits  COMPREHENSIVE METABOLIC PANEL - Abnormal; Notable for the following:    Glucose, Bld 166 (*)    Creatinine, Ser 1.20 (*)    Calcium 10.4 (*)  GFR calc non Af Amer 43 (*)    GFR calc Af Amer 50 (*)    All other components within normal limits  URINALYSIS, ROUTINE W REFLEX MICROSCOPIC - Abnormal; Notable for the following:    Color, Urine AMBER (*)    Bilirubin Urine SMALL (*)    Leukocytes, UA TRACE (*)    All other components within normal limits  URINALYSIS, MICROSCOPIC (REFLEX) - Abnormal; Notable for the following:    Bacteria, UA MANY (*)    All other components within normal limits  URINE CULTURE  LIPASE, BLOOD  TROPONIN I    EKG  EKG Interpretation  Date/Time:  Friday June 26 2016 11:47:39 EST Ventricular Rate:  63 PR Interval:    QRS Duration: 122 QT Interval:  442 QTC Calculation: 453 R Axis:   17 Text Interpretation:  Sinus rhythm Left bundle branch block Baseline wander in lead(s) II aVF V6 No significant changes from prior Confirmed by [facility] MD, Velora Horstman ([id number]) on 06/26/2016 12:14:52 PM       Radiology Ct Abdomen Pelvis W Contrast  Result Date: 06/26/2016 CLINICAL DATA:  Acute generalized abdominal pain. EXAM: CT ABDOMEN AND PELVIS WITH CONTRAST TECHNIQUE: Multidetector CT imaging of the abdomen and pelvis was performed using the standard protocol  following bolus administration of intravenous contrast. CONTRAST:  74mL ISOVUE-300 IOPAMIDOL (ISOVUE-300) INJECTION 61% COMPARISON:  CT scan of September 01, 2010. FINDINGS: Lower chest: No acute abnormality. Hepatobiliary: Status post cholecystectomy. Intrahepatic and extrahepatic biliary dilatation is noted which may be due to post cholecystectomy status, but correlation with liver function tests is recommended to rule out obstruction. No focal abnormality is noted in the liver. Pancreas: Unremarkable. No pancreatic ductal dilatation or surrounding inflammatory changes. Spleen: Normal in size without focal abnormality. 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 Adrenals/Urinary Tract: Adrenal glands are unremarkable. Kidneys are normal, without renal calculi, focal lesion, or hydronephrosis. Bladder is unremarkable. Stomach/Bowel: No small bowel dilatation is noted. Large amount of stool and fluid is noted within dilated colon. Vascular/Lymphatic: No significant vascular findings are present. No enlarged abdominal or pelvic lymph nodes. Reproductive: Uterus and bilateral adnexa are unremarkable. Other: No abdominal wall hernia or abnormality. No abdominopelvic ascites. Musculoskeletal: No acute or significant osseous findings. IMPRESSION: Intrahepatic and extrahepatic biliary dilatation is noted which may be due to post cholecystectomy status, but correlation with liver function tests is recommended to rule out obstruction. 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 Dilated colon is noted filled with stool and fluid which may represent constipation. Electronically Signed   By: Marijo Conception, M.D.   On: 06/26/2016 14:42    Procedures Procedures (including critical care time)  Medications Ordered in ED Medications  sodium chloride 0.9 % bolus 1,000 mL (0 mLs Intravenous Stopped 06/26/16 1414)  ondansetron (ZOFRAN) injection 4 mg (4 mg Intravenous Given 06/26/16 1248)  morphine 2 MG/ML injection 2 mg (2 mg  Intravenous Given 06/26/16 1248)  ondansetron (ZOFRAN) injection 4 mg (4 mg Intravenous Given 06/26/16 1412)  iopamidol (ISOVUE-300) 61 % injection 80 mL (80 mLs Intravenous Contrast Given 06/26/16 1416)  acetaminophen (TYLENOL) tablet 1,000 mg (1,000 mg Oral Given 06/26/16 1513)  lidocaine (XYLOCAINE) 2 % jelly 1 application (1 application Topical Given by Other 06/26/16 1529)     Initial Impression / Assessment and Plan / ED Course  I have reviewed the triage vital signs and the nursing notes.  Pertinent labs & imaging results that were available during my care of the patient were  reviewed by me and considered in my medical decision making (see chart for details).  Clinical Course    76yo female with history of interstitial cystitis, fibromyalgia, LBBB, htn, hlpd presents with concern for nausea, vomiting and abdominal pain. Pt without pain on hx at thist time however does have tenderness on exam and CT ordered toeval for obstruction or diverticulitis. CT shows no acute findings. Suspect biliary dilation is post-cholecystectomy as patient without changes in LFTs, with primarily lower pain and tenderness on exam and doubt biliary obstruction. Urinalysis shows many bacteria without squamous cells and pyelonephritis given emesis and urinalysis likely. Given rx for keflex for 2 weeks.  Patient also with constipation on hx and CT, attempted disimpaction however pt with only small hard stool on exam. Recommend miralax/suppository.  Patient able to tolerate po after zofran. Doubt candiduria symptomatic.  Pt with likely pyelonephritis/constipation contributing to symptoms. Given rx for zofran, bentyl, keflex. Patient discharged in stable condition with understanding of reasons to return.   Final Clinical Impressions(s) / ED Diagnoses   Final diagnoses:  Non-intractable vomiting with nausea, unspecified vomiting type  Constipation, unspecified constipation type  Acute pyelonephritis    New  Prescriptions Discharge Medication List as of 06/26/2016  4:15 PM    START taking these medications   Details  cephALEXin (KEFLEX) 500 MG capsule Take 1 capsule (500 mg total) by mouth 3 (three) times daily., Starting Fri 06/26/2016, Until Fri 07/10/2016, Print    dicyclomine (BENTYL) 20 MG tablet Take 1 tablet (20 mg total) by mouth 2 (two) times daily as needed for spasms., Starting Fri 06/26/2016, Print    ondansetron (ZOFRAN ODT) 4 MG disintegrating tablet Take 1 tablet (4 mg total) by mouth every 8 (eight) hours as needed for nausea or vomiting., Starting Fri 06/26/2016, Print         Gareth Morgan, MD 06/27/16 1052

## 2016-06-30 ENCOUNTER — Ambulatory Visit (INDEPENDENT_AMBULATORY_CARE_PROVIDER_SITE_OTHER): Payer: Medicare Other | Admitting: Sports Medicine

## 2016-06-30 ENCOUNTER — Encounter: Payer: Self-pay | Admitting: Sports Medicine

## 2016-06-30 ENCOUNTER — Ambulatory Visit
Admission: RE | Admit: 2016-06-30 | Discharge: 2016-06-30 | Disposition: A | Discharge status: Home / Self Care | Payer: Medicare Other | Source: Ambulatory Visit | Attending: Sports Medicine | Admitting: Sports Medicine

## 2016-06-30 VITALS — BP 130/58 | Ht 63.0 in | Wt 145.0 lb

## 2016-06-30 DIAGNOSIS — M5416 Radiculopathy, lumbar region: Secondary | ICD-10-CM

## 2016-06-30 DIAGNOSIS — M47816 Spondylosis without myelopathy or radiculopathy, lumbar region: Secondary | ICD-10-CM | POA: Diagnosis not present

## 2016-06-30 DIAGNOSIS — M5417 Radiculopathy, lumbosacral region: Secondary | ICD-10-CM

## 2016-06-30 NOTE — Progress Notes (Signed)
   Subjective:    Patient ID: [name], female    DOB: 1940/07/16, 76 y.o.   MRN: [id number]  HPI chief complaint: Low back pain  Very pleasant 76 year old female comes in today complaining of returning right sided low back pain. She has a well-documented history of L4-L5 lumbar spondylosis, spinal stenosis, and sciatica. This was diagnosed via MRI in 2015. Patient underwent a series of lumbar epidural steroid injections and selective nerve root blocks which were tremendously helpful. She was doing well up until 2 months ago when, without any known trauma, her pain began to return. It is not as severe as it was a few years ago. She describes a "hot" type pain in the right buttock as well as along the lateral right foot. She has not noticed any weakness. Her pain seems to be worse when laying in bed at night. It is not exacerbated by standing, walking, or sitting. Today, her pain is not severe. She attributes this to some pain medication she got in the emergency room a few days ago for some stomach pain that she was getting due to viral gastroenteritis. She is also recovering from pyelonephritis.  Interim medical history reviewed Medications reviewed Allergies reviewed    Review of Systems    as above Objective:   Physical Exam  Well-developed, well-nourished. No acute distress. Awake alert and oriented 3. Vital signs reviewed  Lumbar spine: Patient is tender to palpation over the right lumbosacral area. No spasm. No tenderness to palpation or percussion along the lumbar midline. Good lumbar range of motion. She does have some reproducible pain with side bending to the right.  Neurological exam shows reflexes to be equal at the patellar and Achilles reflexes bilaterally. Strength is 5/5 in both lower extremities. Sensation is intact to light touch grossly. No atrophy.  X-rays of her lumbar spine including AP and lateral views are obtained. They're compared to previous x-rays done in  2015. There are mild multilevel degenerative changes. Nothing acute. This appears to be unchanged when compared to the x-ray in 2015.  MRI of her lumbar spine done in 2015 showed advanced facet hypertrophy at L4-L5 with right greater than left nerve root impingement in the canal. She also has moderate to severe central canal stenosis at this level. Otherwise, the remainder of her lumbar spine shows only mild degenerative changes.      Assessment & Plan:   Returning low back pain secondary to lumbar degenerative disc disease/spinal stenosis  Since the patient has had excellent success with epidural steroid injections in the past, this is something she would like to once again consider. We will need to get a preprocedural MRI of her lumbar spine. I will call her with those results once available. I do not want to give her any sort of pain medication given her recent gastroenteritis. I'm concerned that unnecessary medication may cause returning stomach upset. We will simply delineate further treatment when I call her to discuss her MRI results. Patient agrees with this plan.

## 2016-07-04 ENCOUNTER — Ambulatory Visit
Admission: RE | Admit: 2016-07-04 | Discharge: 2016-07-04 | Disposition: A | Discharge status: Home / Self Care | Payer: Medicare Other | Source: Ambulatory Visit | Attending: Sports Medicine | Admitting: Sports Medicine

## 2016-07-04 DIAGNOSIS — M5416 Radiculopathy, lumbar region: Secondary | ICD-10-CM

## 2016-07-04 DIAGNOSIS — M5126 Other intervertebral disc displacement, lumbar region: Secondary | ICD-10-CM | POA: Diagnosis not present

## 2016-07-07 ENCOUNTER — Telehealth: Payer: Self-pay | Admitting: Sports Medicine

## 2016-07-07 NOTE — Telephone Encounter (Signed)
  I spoke with the patient on the phone today after reviewing an updated MRI of her lumbar spine. These results are compared to the MRI from May 2015. Overall there is been no change in her lumbar spondylosis and degenerative disc disease. Changes are most pronounced at L4-L5. Her pain continues to improve. She has done well with epidural steroid injections in the past but we will hold on that for now. If her pain returns and she would like to consider repeat epidural steroid injections, she will simply call my office and we can make that referral to [facility] imaging for her. Otherwise, follow-up as needed.

## 2016-07-13 ENCOUNTER — Encounter: Payer: Self-pay | Admitting: Neurology

## 2016-07-13 ENCOUNTER — Ambulatory Visit (INDEPENDENT_AMBULATORY_CARE_PROVIDER_SITE_OTHER): Payer: Medicare Other | Admitting: Neurology

## 2016-07-13 VITALS — BP 122/72 | HR 78 | Resp 14 | Ht 63.0 in | Wt 119.0 lb

## 2016-07-13 DIAGNOSIS — G250 Essential tremor: Secondary | ICD-10-CM

## 2016-07-13 NOTE — Patient Instructions (Addendum)
I think your exam is stable and we can continue to monitor your exam, you can see one of our nurse practitioners as you are and I will see you after that.

## 2016-07-13 NOTE — Progress Notes (Signed)
Subjective:    Patient ID: [name] is a 76 y.o. female.  HPI     Interim history:   Ms. [name] is a very pleasant 76 year old right-handed woman with an underlying medical history of osteoarthritis, allergic rhinitis, fibromyalgia, hyperlipidemia, osteopenia, left bundle branch block, tinnitus, hearing loss, hypertension, and recurrent headaches, who presents for follow-up consultation of her tremors. The patient is unaccompanied today. I first met her on 01/08/2016 at the request of her primary care physician, at which time she reported intermittent facial trembling and bilateral arm and hand tremors. Her history and physical exam are in keeping with mild essential tremor and I suggested we monitor her symptoms. She had recent blood work through her PCP. Her exam was otherwise nonfocal, she did give a family history of tremors in her sister.   Today, 07/13/2016: She reports that her tremor became worse, but recently in the past 3 weeks, she feels a little better. She has seen another rheumatologist, and then, some 2 months ago, she was restarted on low dose prednisone 5 mg qod. Had bout of severe N/V and also constipation, had to go to ER on 06/26/16, I reviewed the ER records. She had a CT abd w/wo contrast: IMPRESSION: Intrahepatic and extrahepatic biliary dilatation is noted which may be due to post cholecystectomy status, but correlation with liver function tests is recommended to rule out obstruction. 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 Dilated colon is noted filled with stool and fluid which may represent constipation.  She finished her Keflex 3 days ago.   Previously:   01/08/2016: She reports intermittent facial trembling, typically first thing in the morning, intermittently for the past months, since April or so and bilateral arm tremors and hand tremors intermittently as well. I reviewed your office note from 12/31/2015, which you kindly included. She was noted to have a  slight voice tremor. She reported no other trembling elsewhere. She denied restless leg symptoms.    She reports that she was treated for the flu and UTI around Easter and was first treated with ampicillin and then with Cipro. After she stopped the antibiotics she noted weakness and trembling, affecting both upper and lower extremities and the neck and jaw.    She has knee pain b/l and has received injections into the L knee.    She has a FHx of tremors in her sister who has a tremor of her hands or right hand. Sister has trouble feeding herself d/t tremor. Patient reports that sister is several years older. Sister's husband had Parkinson's disease and patient worries a little bit about that as well. She has issues with interstitial cystitis. She has been on physical therapy for this. She sees Dr. Roni Bread for this. She is trying to drink enough water and follow through with the exercises at home as well.   She thinks that her balance is not as good. She took a fall when she was still dealing with her flu and UTI issues. This was at home and she fell against the dresser.   Patient is a non-smoker, does not drink alcohol and has very little caffeine, in the form of jasmine tea.    Addendum, 5:30 PM: I reviewed blood test results from 11/28/2015: BMP showed creatinine of 1.2, glucose 64, otherwise unremarkable, CK level normal at 42, ESR normal at 15, lipid panel showed total cholesterol of 202, LDL 134, triglycerides 118. TSH on 04/01/2015 was 3.64. Vitamin B12 on 07/16/2014 was 501, magnesium 2.1.  Her Past  Medical History Is Significant For: Past Medical History:  Diagnosis Date  . Allergy   . Arthritis   . Chronic headaches   . Fibromyalgia   . Gallstones   . Hyperlipemia   . Hypertension   . Interstitial cystitis   . Lactose intolerance   . LBBB (left bundle branch block)   . Osteoarthritis   . Pneumonia   . Tinnitus     Her Past Surgical History Is Significant For: Past Surgical  History:  Procedure Laterality Date  . CHOLECYSTECTOMY  03/2010  . COLONOSCOPY  2009   Dr. Raford Pitcher   . CYSTOSCOPY     Multiple Cystoscopies with stents or biopsy   . TONSILLECTOMY      Her Family History Is Significant For: Family History  Problem Relation Age of Onset  . Heart disease Mother   . Heart disease Father   . Stroke Father   . Stroke Sister     Her Social History Is Significant For: Social History   Social History  . Marital status: Married    Spouse name: N/A  . Number of children: 2  . Years of education: college   Occupational History  . housewife Retired   Social History Main Topics  . Smoking status: Never Smoker  . Smokeless tobacco: Never Used  . Alcohol use No  . Drug use: No  . Sexual activity: Not Asked   Other Topics Concern  . None   Social History Narrative   1 cup of Jasmine tea daily     Her Allergies Are:  Allergies  Allergen Reactions  . Persantine [Dipyridamole] Other (See Comments)    Migraines   . Esomeprazole Other (See Comments)    Jittery, sore throat  . Irbesartan Diarrhea  . Levothyroxine   . Losartan Other (See Comments)    Hot and chills  . Maxzide [Hydrochlorothiazide W-Triamterene] Other (See Comments)    Cramps   . Micardis [Telmisartan] Other (See Comments)    headache  . Butrans [Buprenorphine] Other (See Comments)    "knocks me out"  . Codeine Nausea Only  . Doxycycline Rash  . Lactose Intolerance (Gi) Other (See Comments)    GI upset   . Nortriptyline Hcl Other (See Comments)    "My body doesn't metabolize it anymore."  . Oxycontin [Oxycodone Hcl] Other (See Comments)    Interstitial cystitis   . Pennsaid [Diclofenac Sodium] Hives  . Tramadol Itching    sedation  :   Her Current Medications Are:  Outpatient Encounter Prescriptions as of 07/13/2016  Medication Sig  . calcium gluconate 500 MG tablet Take 1 tablet by mouth 2 (two) times daily.  . cetirizine (ZYRTEC) 10 MG tablet Take 10  mg by mouth daily.  . Cholecalciferol (VITAMIN D-3) 1000 UNITS CAPS Take 1 capsule by mouth daily.  Marland Kitchen dicyclomine (BENTYL) 20 MG tablet Take 1 tablet (20 mg total) by mouth 2 (two) times daily as needed for spasms.  . Digestive Enzymes (ENZYME DIGEST PO) Take by mouth.  . DULoxetine (CYMBALTA) 30 MG capsule Take 30 mg by mouth 2 (two) times daily.   Marland Kitchen HYDROcodone-acetaminophen (NORCO/VICODIN) 5-325 MG per tablet Take 0.5 tablets by mouth as needed.   . Lactobacillus (MORE-DOPHILUS ACIDOPHILUS PO) Take by mouth.  . nitrofurantoin (MACRODANTIN) 100 MG capsule Take 100 mg by mouth as needed.  . ondansetron (ZOFRAN ODT) 4 MG disintegrating tablet Take 1 tablet (4 mg total) by mouth every 8 (eight) hours as needed for nausea or vomiting.  Marland Kitchen  ondansetron (ZOFRAN) 4 MG tablet Take 1 tab every 6 hours as needed for nausea.  . phenazopyridine (PYRIDIUM) 200 MG tablet Take 200 mg by mouth as needed for pain.  . predniSONE (DELTASONE) 5 MG tablet Take 5 mg by mouth 3 (three) times a week.  . [DISCONTINUED] cyclobenzaprine (FLEXERIL) 10 MG tablet Take 10 mg by mouth daily as needed.  . [DISCONTINUED] ELMIRON 100 MG capsule Take 100 mg by mouth daily.  . [DISCONTINUED] ranitidine (ZANTAC) 150 MG capsule Take 150 mg by mouth 2 (two) times daily.   No facility-administered encounter medications on file as of 07/13/2016.   :  Review of Systems:  Out of a complete 14 point review of systems, all are reviewed and negative with the exception of these symptoms as listed below: Review of Systems  Neurological:       Patient states that since last visit, her tremors have increased. But in the last 3 weeks they have slightly improved.  Rheumatologist has put the patient back on prednisone.     Objective:  Neurologic Exam  Physical Exam Physical Examination:   Vitals:   07/13/16 1139  BP: 122/72  Pulse: 78  Resp: 14   General Examination: The patient is a very pleasant 76 y.o. female in no acute  distress. She appears well-developed and well-nourished and well groomed.   HEENT: Normocephalic, atraumatic, pupils are equal, round and reactive to light and accommodation. Extraocular tracking is good without limitation to gaze excursion or nystagmus noted. Normal smooth pursuit is noted. Hearing is grossly intact. Face is symmetric with normal facial animation and normal facial sensation. Speech is clear with no dysarthria noted. There is no hypophonia. She has a very mild intermittent lower jaw tremor, very slight voice tremor, intermittent side-to-side head tremor, slightly better or stable. Neck is supple with full range of passive and active motion. There are no carotid bruits on auscultation. Oropharynx exam reveals: mild mouth dryness, good dental hygiene and no significant airway crowding. Tongue protrudes centrally and palate elevates symmetrically.  Chest: Clear to auscultation without wheezing, rhonchi or crackles noted.  Heart: S1+S2+0, regular and normal without murmurs, rubs or gallops noted.   Abdomen: Soft, non-tender and non-distended with normal bowel sounds appreciated on auscultation.  Extremities: There is no pitting edema in the distal lower extremities bilaterally.  Skin: Warm and dry without trophic changes noted. There are no varicose veins.  Musculoskeletal: exam reveals no obvious joint deformities, tenderness or joint swelling or erythema.   Neurologically:  Mental status: The patient is awake, alert and oriented in all 4 spheres. Her immediate and remote memory, attention, language skills and fund of knowledge are appropriate. There is no evidence of aphasia, agnosia, apraxia or anomia. Speech is clear with normal prosody and enunciation. Thought process is linear. Mood is normal and affect is normal.  Cranial nerves II - XII are as described above under HEENT exam. In addition: shoulder shrug is normal with equal shoulder height noted. Motor exam: Normal bulk,  strength and tone is noted. There is no resting tremor or rebound.   (On 01/08/16: Archimedes spiral drawing she has very mild tremulousness with the left hand. Handwriting is not tremulous, legible and not micrographic.)   She has a very slight postural tremor in both hands, no significant action tremor. No intention tremor. Romberg is negative. Reflexes are 2+ throughout. Fine motor skills and coordination: intact with normal finger taps, normal hand movements, normal rapid alternating patting, normal foot taps and normal  foot agility.  Cerebellar testing: No dysmetria or intention tremor on finger to nose testing. Heel to shin is unremarkable bilaterally.  Sensory exam: intact  in the upper and lower extremities. She stands up without difficulty. She can stand narrow based. Tandem walk is unremarkable for age. She has preserved arm swing when walking and turns fine. She has no significant balance issues. She has normal stride length and pace, no shuffling noted.               Assessment and Plan:   In summary, Darrah Dredge is a very pleasant 76 year old female with an underlying medical history of osteoarthritis, allergic rhinitis, fibromyalgia, hyperlipidemia, osteopenia, left bundle branch block, tinnitus, hearing loss, hypertension, and recurrent headaches, who presents for follow-up consultation of her head and upper extremity tremors, findings are overall stable and mostly in keeping with mild essential tremor affecting her head and neck area, lips and jaw area, as well as upper extremities. She reported that her symptoms had started after an illness. She was on 2 different types of antibiotics at the time. 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 I again advised the patient that most likely she has a familial type of tremor, in particular what with her sister's history of tremors. She has no overt signs of parkinsonism and her history is not suggestive of parkinsonism.  Thankfully, otherwise her exam is nonfocal and benign. Tremor itself is also minimal and we can continue to monitor. I suggested a 6 month FU with one of our NPs. I will see her back after that.  I answered all her questions today and the patient was in agreement. I spent 25 minutes in total face-to-face time with the patient, more than 50% of which was spent in counseling and coordination of care, reviewing test results, reviewing medication and discussing or reviewing the diagnosis of ET, its prognosis and treatment options.

## 2016-07-15 DIAGNOSIS — M791 Myalgia: Secondary | ICD-10-CM | POA: Diagnosis not present

## 2016-07-15 DIAGNOSIS — M797 Fibromyalgia: Secondary | ICD-10-CM | POA: Diagnosis not present

## 2016-07-15 DIAGNOSIS — M150 Primary generalized (osteo)arthritis: Secondary | ICD-10-CM | POA: Diagnosis not present

## 2016-07-15 DIAGNOSIS — M170 Bilateral primary osteoarthritis of knee: Secondary | ICD-10-CM | POA: Diagnosis not present

## 2016-08-03 DIAGNOSIS — M2669 Other specified disorders of temporomandibular joint: Secondary | ICD-10-CM | POA: Diagnosis not present

## 2016-08-03 DIAGNOSIS — J0100 Acute maxillary sinusitis, unspecified: Secondary | ICD-10-CM | POA: Diagnosis not present

## 2016-08-03 DIAGNOSIS — H9202 Otalgia, left ear: Secondary | ICD-10-CM | POA: Diagnosis not present

## 2016-08-03 DIAGNOSIS — K0889 Other specified disorders of teeth and supporting structures: Secondary | ICD-10-CM | POA: Diagnosis not present

## 2016-08-05 DIAGNOSIS — N393 Stress incontinence (female) (male): Secondary | ICD-10-CM | POA: Diagnosis not present

## 2016-08-05 DIAGNOSIS — N3010 Interstitial cystitis (chronic) without hematuria: Secondary | ICD-10-CM | POA: Diagnosis not present

## 2016-08-17 DIAGNOSIS — J3489 Other specified disorders of nose and nasal sinuses: Secondary | ICD-10-CM | POA: Diagnosis not present

## 2016-08-17 DIAGNOSIS — H9202 Otalgia, left ear: Secondary | ICD-10-CM | POA: Diagnosis not present

## 2016-08-17 DIAGNOSIS — K0889 Other specified disorders of teeth and supporting structures: Secondary | ICD-10-CM | POA: Diagnosis not present

## 2016-08-26 DIAGNOSIS — J209 Acute bronchitis, unspecified: Secondary | ICD-10-CM | POA: Diagnosis not present

## 2016-09-17 DIAGNOSIS — Z1231 Encounter for screening mammogram for malignant neoplasm of breast: Secondary | ICD-10-CM | POA: Diagnosis not present

## 2016-09-17 DIAGNOSIS — N941 Unspecified dyspareunia: Secondary | ICD-10-CM | POA: Diagnosis not present

## 2016-09-17 DIAGNOSIS — Z6821 Body mass index (BMI) 21.0-21.9, adult: Secondary | ICD-10-CM | POA: Diagnosis not present

## 2016-09-17 DIAGNOSIS — Z01419 Encounter for gynecological examination (general) (routine) without abnormal findings: Secondary | ICD-10-CM | POA: Diagnosis not present

## 2016-09-17 DIAGNOSIS — Z124 Encounter for screening for malignant neoplasm of cervix: Secondary | ICD-10-CM | POA: Diagnosis not present

## 2016-10-02 DIAGNOSIS — H2523 Age-related cataract, morgagnian type, bilateral: Secondary | ICD-10-CM | POA: Diagnosis not present

## 2016-11-09 ENCOUNTER — Telehealth: Payer: Self-pay | Admitting: *Deleted

## 2016-11-09 DIAGNOSIS — M5416 Radiculopathy, lumbar region: Secondary | ICD-10-CM

## 2016-11-09 NOTE — Telephone Encounter (Signed)
Order placed to [place] at [facility], who will call and schedule the patient

## 2016-11-10 ENCOUNTER — Other Ambulatory Visit: Payer: Self-pay | Admitting: Sports Medicine

## 2016-11-10 DIAGNOSIS — M5416 Radiculopathy, lumbar region: Secondary | ICD-10-CM

## 2016-11-17 ENCOUNTER — Other Ambulatory Visit: Payer: Medicare Other

## 2016-11-19 ENCOUNTER — Ambulatory Visit
Admission: RE | Admit: 2016-11-19 | Discharge: 2016-11-19 | Disposition: A | Discharge status: Home / Self Care | Payer: Medicare Other | Source: Ambulatory Visit | Attending: Sports Medicine | Admitting: Sports Medicine

## 2016-11-19 DIAGNOSIS — M5126 Other intervertebral disc displacement, lumbar region: Secondary | ICD-10-CM | POA: Diagnosis not present

## 2016-11-19 DIAGNOSIS — M5416 Radiculopathy, lumbar region: Secondary | ICD-10-CM

## 2016-11-19 MED ORDER — METHYLPREDNISOLONE ACETATE 40 MG/ML INJ SUSP (RADIOLOG
120.0000 mg | Freq: Once | INTRAMUSCULAR | Status: AC
  Administered 2016-11-19: 120 mg via EPIDURAL

## 2016-11-19 MED ORDER — IOPAMIDOL (ISOVUE-M 200) INJECTION 41%
1.0000 mL | Freq: Once | INTRAMUSCULAR | Status: AC
  Administered 2016-11-19: 1 mL via EPIDURAL

## 2016-11-19 NOTE — Discharge Instructions (Signed)

## 2016-11-25 DIAGNOSIS — I10 Essential (primary) hypertension: Secondary | ICD-10-CM | POA: Diagnosis not present

## 2016-11-25 DIAGNOSIS — Z79899 Other long term (current) drug therapy: Secondary | ICD-10-CM | POA: Diagnosis not present

## 2016-11-25 DIAGNOSIS — E039 Hypothyroidism, unspecified: Secondary | ICD-10-CM | POA: Diagnosis not present

## 2016-11-25 DIAGNOSIS — N390 Urinary tract infection, site not specified: Secondary | ICD-10-CM | POA: Diagnosis not present

## 2016-11-25 DIAGNOSIS — Z0000 Encounter for general adult medical examination without abnormal findings: Secondary | ICD-10-CM | POA: Diagnosis not present

## 2016-11-25 DIAGNOSIS — E559 Vitamin D deficiency, unspecified: Secondary | ICD-10-CM | POA: Diagnosis not present

## 2016-11-25 DIAGNOSIS — E7800 Pure hypercholesterolemia, unspecified: Secondary | ICD-10-CM | POA: Diagnosis not present

## 2016-12-01 DIAGNOSIS — M8580 Other specified disorders of bone density and structure, unspecified site: Secondary | ICD-10-CM | POA: Diagnosis not present

## 2016-12-01 DIAGNOSIS — M170 Bilateral primary osteoarthritis of knee: Secondary | ICD-10-CM | POA: Diagnosis not present

## 2016-12-01 DIAGNOSIS — K5900 Constipation, unspecified: Secondary | ICD-10-CM | POA: Diagnosis not present

## 2016-12-01 DIAGNOSIS — R5383 Other fatigue: Secondary | ICD-10-CM | POA: Diagnosis not present

## 2016-12-08 DIAGNOSIS — R790 Abnormal level of blood mineral: Secondary | ICD-10-CM | POA: Diagnosis not present

## 2016-12-09 DIAGNOSIS — H9312 Tinnitus, left ear: Secondary | ICD-10-CM | POA: Diagnosis not present

## 2016-12-09 DIAGNOSIS — H903 Sensorineural hearing loss, bilateral: Secondary | ICD-10-CM | POA: Diagnosis not present

## 2016-12-14 DIAGNOSIS — Z1212 Encounter for screening for malignant neoplasm of rectum: Secondary | ICD-10-CM | POA: Diagnosis not present

## 2016-12-16 DIAGNOSIS — M797 Fibromyalgia: Secondary | ICD-10-CM | POA: Diagnosis not present

## 2016-12-16 DIAGNOSIS — K5900 Constipation, unspecified: Secondary | ICD-10-CM | POA: Diagnosis not present

## 2017-01-11 ENCOUNTER — Encounter: Payer: Self-pay | Admitting: Adult Health

## 2017-01-11 ENCOUNTER — Ambulatory Visit (INDEPENDENT_AMBULATORY_CARE_PROVIDER_SITE_OTHER): Payer: Medicare Other | Admitting: Adult Health

## 2017-01-11 VITALS — BP 141/72 | HR 66 | Wt 131.6 lb

## 2017-01-11 DIAGNOSIS — G250 Essential tremor: Secondary | ICD-10-CM | POA: Diagnosis not present

## 2017-01-11 NOTE — Progress Notes (Addendum)
PATIENT: [name] DOB: April 18, 1941  REASON FOR VISIT: follow up HISTORY FROM: patient  HISTORY OF PRESENT ILLNESS: Today 01/11/17 [name] is a 76 year old female with a history of essential tremor. She returns today for follow-up. She states that her tremor primarily effects the head and hands. She does feel that her tremor has gotten slightly worse. She reports that it does not interfere with her activities of daily living. She is able to feed herself, write and button buttons. She states that she does like to sew and she has noticed that she has a hard pick up a needle. The patient has several other complaints today including feeling tired and fatigued as well as increased appetite. She states that her primary care has referred her to endocrinology for possible thyroid issues. She states that her PCP also told her she may have neuropathy in the feet as she has been having numbness. She also states that she's been having tooth pain on the right that radiates up to the ear. She reports that her dentist has told her that it is not her tooth but she feels that it is considering she had a root canal on that date. She returns today for an evaluation.  HISTORY [name] is a very pleasant 76 year old right-handed woman with an underlying medical history of osteoarthritis, allergic rhinitis, fibromyalgia, hyperlipidemia, osteopenia, left bundle branch block, tinnitus, hearing loss, hypertension, and recurrent headaches, who presents for follow-up consultation of her tremors. The patient is unaccompanied today. I first met her on 01/08/2016 at the request of her primary care physician, at which time she reported intermittent facial trembling and bilateral arm and hand tremors. Her history and physical exam are in keeping with mild essential tremor and I suggested we monitor her symptoms. She had recent blood work through her PCP. Her exam was otherwise nonfocal, she did give a family history of  tremors in her sister.   07/13/2016: She reports that her tremor became worse, but recently in the past 3 weeks, she feels a little better. She has seen another rheumatologist, and then, some 2 months ago, she was restarted on low dose prednisone 5 mg qod. Had bout of severe N/V and also constipation, had to go to ER on 06/26/16, I reviewed the ER records. She had a CT abd w/wo contrast: IMPRESSION: Intrahepatic and extrahepatic biliary dilatation is noted which may be due to post cholecystectomy status, but correlation with liver function tests is recommended to rule out obstruction. 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 Dilated colon is noted filled with stool and fluid which may represent constipation.  REVIEW OF SYSTEMS: Out of a complete 14 system review of symptoms, the patient complains only of the following symptoms, and all other reviewed systems are negative.  Light sensitivity, double vision, loss of vision, appetite change, chills, ear discharge, hearing loss, ear pain, reducing ears, runny nose, constipation, restless leg, joint pain, back pain, muscle cramps, dizziness, numbness, tremors, bruise/bleed easily, environmental allergies, food allergies  ALLERGIES: Allergies  Allergen Reactions  . Persantine [Dipyridamole] Other (See Comments)    Migraines   . Levothyroxine   . Butrans [Buprenorphine] Other (See Comments)    "knocks me out"  . Codeine Nausea Only  . Doxycycline Rash  . Esomeprazole Other (See Comments)    Jittery, sore throat  . Irbesartan Diarrhea  . Lactose Intolerance (Gi) Other (See Comments)    GI upset   . Lactulose Other (See Comments)    GI upset  .  Losartan Other (See Comments)    Hot and chills  . Maxzide [Hydrochlorothiazide W-Triamterene] Other (See Comments)    Cramps   . Micardis [Telmisartan] Other (See Comments)    headache  . Nortriptyline Hcl Other (See Comments)    "My body doesn't metabolize it anymore."  . Oxycodone Other (See  Comments)    Interstitial cystitis  . Oxycontin [Oxycodone Hcl] Other (See Comments)    Interstitial cystitis   . Pennsaid [Diclofenac Sodium] Hives  . Tramadol Itching    sedation    HOME MEDICATIONS: Outpatient Medications Prior to Visit  Medication Sig Dispense Refill  . calcium gluconate 500 MG tablet Take 1 tablet by mouth 2 (two) times daily.    . cetirizine (ZYRTEC) 10 MG tablet Take 10 mg by mouth daily.    . Cholecalciferol (VITAMIN D-3) 1000 UNITS CAPS Take 1 capsule by mouth daily.    Marland Kitchen dicyclomine (BENTYL) 20 MG tablet Take 1 tablet (20 mg total) by mouth 2 (two) times daily as needed for spasms. 20 tablet 0  . Digestive Enzymes (ENZYME DIGEST PO) Take by mouth.    . DULoxetine (CYMBALTA) 30 MG capsule Take 30 mg by mouth 2 (two) times daily.     Marland Kitchen HYDROcodone-acetaminophen (NORCO/VICODIN) 5-325 MG per tablet Take 0.5 tablets by mouth as needed.     . Lactobacillus (MORE-DOPHILUS ACIDOPHILUS PO) Take by mouth.    . nitrofurantoin (MACRODANTIN) 100 MG capsule Take 100 mg by mouth as needed.    . ondansetron (ZOFRAN ODT) 4 MG disintegrating tablet Take 1 tablet (4 mg total) by mouth every 8 (eight) hours as needed for nausea or vomiting. 20 tablet 0  . ondansetron (ZOFRAN) 4 MG tablet Take 1 tab every 6 hours as needed for nausea. 40 tablet 0  . phenazopyridine (PYRIDIUM) 200 MG tablet Take 200 mg by mouth as needed for pain.    . predniSONE (DELTASONE) 5 MG tablet Take 5 mg by mouth 3 (three) times a week.     No facility-administered medications prior to visit.     PAST MEDICAL HISTORY: Past Medical History:  Diagnosis Date  . Allergy   . Arthritis   . Chronic headaches   . Fibromyalgia   . Gallstones   . Hyperlipemia   . Hypertension   . Interstitial cystitis   . Lactose intolerance   . LBBB (left bundle branch block)   . Osteoarthritis   . Pneumonia   . Tinnitus     PAST SURGICAL HISTORY: Past Surgical History:  Procedure Laterality Date  .  CHOLECYSTECTOMY  03/2010  . COLONOSCOPY  2009   Dr. Raford Pitcher   . CYSTOSCOPY     Multiple Cystoscopies with stents or biopsy   . TONSILLECTOMY      FAMILY HISTORY: Family History  Problem Relation Age of Onset  . Heart disease Mother   . Heart disease Father   . Stroke Father   . Stroke Sister     SOCIAL HISTORY: Social History   Social History  . Marital status: Married    Spouse name: N/A  . Number of children: 2  . Years of education: college   Occupational History  . housewife Retired   Social History Main Topics  . Smoking status: Never Smoker  . Smokeless tobacco: Never Used  . Alcohol use No  . Drug use: No  . Sexual activity: Not on file   Other Topics Concern  . Not on file   Social History Narrative  1 cup of Jasmine tea daily       PHYSICAL EXAM  Vitals:   01/11/17 1128  BP: (!) 141/72  Pulse: 66  Weight: 131 lb 9.6 oz (59.7 kg)   Body mass index is 23.31 kg/m.  Generalized: Well developed, in no acute distress   Neurological examination  Mentation: Alert oriented to time, place, history taking. Follows all commands speech and language fluent Cranial nerve II-XII: Pupils were equal round reactive to light. Extraocular movements were full, visual field were full on confrontational test. Facial sensation and strength were normal. Uvula tongue midline. Head turning and shoulder shrug  were normal and symmetric. Mild head/neck tremor Motor: The motor testing reveals 5 over 5 strength of all 4 extremities. Good symmetric motor tone is noted throughout.  Sensory: Sensory testing is intact to soft touch on all 4 extremities. No evidence of extinction is noted.  Coordination: Cerebellar testing reveals good finger-nose-finger and heel-to-shin bilaterally. Mild intention tremor noted in both hands Gait and station: Gait is normal. Tandem gait is normal. Romberg is negative. No drift is seen.  Reflexes: Deep tendon reflexes are symmetric and  normal bilaterally.   DIAGNOSTIC DATA (LABS, IMAGING, TESTING) - I reviewed patient records, labs, notes, testing and imaging myself where available.  Lab Results  Component Value Date   WBC 15.0 (H) 06/26/2016   HGB 14.9 06/26/2016   HCT 45.7 06/26/2016   MCV 93.1 06/26/2016   PLT 194 06/26/2016      Component Value Date/Time   NA 140 06/26/2016 1154   K 3.6 06/26/2016 1154   CL 103 06/26/2016 1154   CO2 27 06/26/2016 1154   GLUCOSE 166 (H) 06/26/2016 1154   BUN 19 06/26/2016 1154   CREATININE 1.20 (H) 06/26/2016 1154   CALCIUM 10.4 (H) 06/26/2016 1154   PROT 7.4 06/26/2016 1154   ALBUMIN 4.5 06/26/2016 1154   AST 23 06/26/2016 1154   ALT 14 06/26/2016 1154   ALKPHOS 51 06/26/2016 1154   BILITOT 0.6 06/26/2016 1154   GFRNONAA 43 (L) 06/26/2016 1154   GFRAA 50 (L) 06/26/2016 1154       ASSESSMENT AND PLAN 75 y.o. year old female  has a past medical history of Allergy; Arthritis; Chronic headaches; Fibromyalgia; Gallstones; Hyperlipemia; Hypertension; Interstitial cystitis; Lactose intolerance; LBBB (left bundle branch block); Osteoarthritis; Pneumonia; and Tinnitus. here with:  1. Essential tremor  The patient's tremor remains very mild. At this time she does not want to initiate any medication. I agree with this plan. Certainly if her symptoms worsen we can consider medication. The patient also has several other complaints today that is being followed up with by her primary care and Dentist. I have advised that if it is felt that this complaints is from a neurologic origin a referral can be placed to our office. She voiced understanding. She will follow-up in 6 months or sooner if needed.  I spent 15 minutes with the patient. 50% of this time was spent discussing medication as well as other symptoms.      Ward Givens, MSN, NP-C 01/11/2017, 2:09 PM [facility] [address], [place], [place] [address] [phone]  I reviewed the  above note and documentation by the Nurse Practitioner and agree with the history, physical exam, assessment and plan as outlined above. I was immediately available for face-to-face consultation. Star Age, MD, PhD [facility])

## 2017-01-11 NOTE — Patient Instructions (Signed)
Your Plan:  Continue to monitor tremor if worsens please let us know If your symptoms worsen or you develop new symptoms please let us know.    Thank you for coming to see [place] at [facility]. I hope we have been able to provide you high quality care today.  You may receive a patient satisfaction survey over the next few weeks. We would appreciate your feedback and comments so that we may continue to improve ourselves and the health of our patients.

## 2017-01-12 DIAGNOSIS — M797 Fibromyalgia: Secondary | ICD-10-CM | POA: Diagnosis not present

## 2017-01-12 DIAGNOSIS — M170 Bilateral primary osteoarthritis of knee: Secondary | ICD-10-CM | POA: Diagnosis not present

## 2017-01-12 DIAGNOSIS — M150 Primary generalized (osteo)arthritis: Secondary | ICD-10-CM | POA: Diagnosis not present

## 2017-01-12 DIAGNOSIS — M25559 Pain in unspecified hip: Secondary | ICD-10-CM | POA: Diagnosis not present

## 2017-01-27 DIAGNOSIS — E039 Hypothyroidism, unspecified: Secondary | ICD-10-CM | POA: Diagnosis not present

## 2017-03-02 DIAGNOSIS — M25559 Pain in unspecified hip: Secondary | ICD-10-CM | POA: Diagnosis not present

## 2017-03-02 DIAGNOSIS — M170 Bilateral primary osteoarthritis of knee: Secondary | ICD-10-CM | POA: Diagnosis not present

## 2017-03-02 DIAGNOSIS — M25562 Pain in left knee: Secondary | ICD-10-CM | POA: Diagnosis not present

## 2017-03-02 DIAGNOSIS — M797 Fibromyalgia: Secondary | ICD-10-CM | POA: Diagnosis not present

## 2017-03-10 DIAGNOSIS — E039 Hypothyroidism, unspecified: Secondary | ICD-10-CM | POA: Diagnosis not present

## 2017-04-08 DIAGNOSIS — Z23 Encounter for immunization: Secondary | ICD-10-CM | POA: Diagnosis not present

## 2017-04-12 ENCOUNTER — Ambulatory Visit (INDEPENDENT_AMBULATORY_CARE_PROVIDER_SITE_OTHER): Payer: Medicare Other | Admitting: Neurology

## 2017-04-12 ENCOUNTER — Encounter: Payer: Self-pay | Admitting: Neurology

## 2017-04-12 VITALS — BP 139/75 | HR 81 | Ht 63.0 in | Wt 136.0 lb

## 2017-04-12 DIAGNOSIS — G250 Essential tremor: Secondary | ICD-10-CM | POA: Diagnosis not present

## 2017-04-12 DIAGNOSIS — G518 Other disorders of facial nerve: Secondary | ICD-10-CM | POA: Diagnosis not present

## 2017-04-12 MED ORDER — CARBAMAZEPINE 200 MG PO TABS: ORAL_TABLET | ORAL | 3 refills | Status: DC

## 2017-04-12 NOTE — Patient Instructions (Addendum)
Your tremor is stable.   Your facial pain may be a form of nerve irritation.  We will do a brain scan, called MRI and call you with the test results. We will have to schedule you for this on a separate date. This test requires authorization from your insurance, and we will take care of the insurance process. We will try you on low dose Tegretol (carbamazepine) 200 mg strength: Take 1/2 at bedtime for 1 week, then 1 pill at bedtime for 1 week, then 1 pill twice.  Side effects include sleepiness, daytime grogginess, balance problems, dizziness.   You may need to talk to your pharmacist about your nortriptyline issue in the past, which was not a reaction but the nortriptyline stopped working.

## 2017-04-12 NOTE — Progress Notes (Signed)
Subjective:    Patient ID: [name] is a 76 y.o. female.  HPI     Interim history:  [name] is a very pleasant 76 year old right-handed woman with an underlying medical history of osteoarthritis, allergic rhinitis, fibromyalgia, hyperlipidemia, osteopenia, left bundle branch block, tinnitus, hearing loss, hypertension, and recurrent headaches, who is referred by her rheumatologist for a new problem of facial pain since 2/18. The patient is uncompanied today. I last saw her on 07/13/2016 for her essential tremors. At that time she reported that her tremors became worse but in the most recent 3 weeks she felt a little better. She had started low-dose prednisone 5 mg every other day after seeing a new rheumatologist. She had a bout of nausea and vomiting and significant constipation, went to the emergency room on 06/26/2016. She finished a recent course of antibiotics.  She saw [name] and follow-up on 01/11/17, at which time her tremor was noted to be rather mild.  Today, 04/12/2017: She reports recent onset of jaw pain. She saw her dentist and also an endodontist for this. I reviewed office records from [name] her endodontic specialist from 02/16/2017, he suspected possible neuralgic pain. Her pain is on the left side and she is sensitive to cold but this was felt to be within normal limits for her. She saw ENT and had a CT of her sinuses. She has had left upper dental pain and left ear pain for months. I reviewed an office note from ENT from February 2018. 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 CT sinuses from 08/17/2016 showed: IMPRESSION: Normal paranasal sinuses. No acute maxillary dental finding identified.  She is on a prednisone taper, now on 1 mg daily.  She bruises easily.   She had seen her dentist first, then the ENT, then the endodontist.  She had a crown  replaced in Jan 2018 in the L lower back, molar. Then had root canal to the tooth adjacent to it.  She desribes the pain as constant, sharp, stabbing, at times prickly sensation, typically starts in the upper left jaw area, radiates sometimes to the ER, sometimes with a dull pain in the ear, sometimes radiates to the left side of the nose and feels like her left upper lip starts trembling when the pain is more intense. She is able to sleep at night but feels like the pain is worse at night. She has a remote history of root canal to the left upper molars in the 80s, had some additional treatment later on.  The patient's allergies, current medications, family history, past medical history, past social history, past surgical history and problem list were reviewed and updated as appropriate.   Previously (copied from previous notes for reference):   I first met her on 01/08/2016 at the request of her primary care physician, at which time she reported intermittent facial trembling and bilateral arm and hand tremors. Her history and physical exam are in keeping with mild essential tremor and I suggested we monitor her symptoms. She had recent blood work through her PCP. Her exam was otherwise nonfocal, she did give a family history of tremors in her sister.    01/08/2016: She reports intermittent facial trembling, typically first thing in the morning, intermittently for the past months, since April or so and bilateral arm tremors and hand tremors intermittently as well. I reviewed your office note from  12/31/2015, which you kindly included. She was noted to have a slight voice tremor. She reported no other trembling elsewhere. She denied restless leg symptoms.    She reports that she was treated for the flu and UTI around Easter and was first treated with ampicillin and then with Cipro. After she stopped the antibiotics she noted weakness and trembling, affecting both upper and lower extremities and the neck  and jaw.    She has knee pain b/l and has received injections into the L knee.    She has a FHx of tremors in her sister who has a tremor of her hands or right hand. Sister has trouble feeding herself d/t tremor. Patient reports that sister is several years older. Sister's husband had Parkinson's disease and patient worries a little bit about that as well. She has issues with interstitial cystitis. She has been on physical therapy for this. She sees Dr. Roni Fuller for this. She is trying to drink enough water and follow through with the exercises at home as well.   She thinks that her balance is not as good. She took a fall when she was still dealing with her flu and UTI issues. This was at home and she fell against the dresser.   Patient is a non-smoker, does not drink alcohol and has very little caffeine, in the form of jasmine tea.    Addendum, 5:30 PM: I reviewed blood test results from 11/28/2015: BMP showed creatinine of 1.2, glucose 64, otherwise unremarkable, CK level normal at 42, ESR normal at 15, lipid panel showed total cholesterol of 202, LDL 134, triglycerides 118. TSH on 04/01/2015 was 3.64. Vitamin B12 on 07/16/2014 was 501, magnesium 2.1.   Her Past Medical History Is Significant For: Past Medical History:  Diagnosis Date  . Allergy   . Arthritis   . Chronic headaches   . Fibromyalgia   . Gallstones   . Hyperlipemia   . Hypertension   . Interstitial cystitis   . Lactose intolerance   . LBBB (left bundle branch block)   . Osteoarthritis   . Pneumonia   . Tinnitus     Her Past Surgical History Is Significant For: Past Surgical History:  Procedure Laterality Date  . CHOLECYSTECTOMY  03/2010  . COLONOSCOPY  2009   Dr. Raford Pitcher   . CYSTOSCOPY     Multiple Cystoscopies with stents or biopsy   . TONSILLECTOMY      Her Family History Is Significant For: Family History  Problem Relation Age of Onset  . Heart disease Mother   . Heart disease Father   . Stroke  Father   . Stroke Sister     Her Social History Is Significant For: Social History   Social History  . Marital status: Married    Spouse name: N/A  . Number of children: 2  . Years of education: college   Occupational History  . housewife Retired   Social History Main Topics  . Smoking status: Never Smoker  . Smokeless tobacco: Never Used  . Alcohol use No  . Drug use: No  . Sexual activity: Not Asked   Other Topics Concern  . None   Social History Narrative   1 cup of Jasmine tea daily     Her Allergies Are:  Allergies  Allergen Reactions  . Persantine [Dipyridamole] Other (See Comments)    Migraines   . Levothyroxine   . Butrans [Buprenorphine] Other (See Comments)    "knocks me out"  . Codeine  Nausea Only  . Doxycycline Rash  . Esomeprazole Other (See Comments)    Jittery, sore throat  . Irbesartan Diarrhea  . Lactose Intolerance (Gi) Other (See Comments)    GI upset   . Lactulose Other (See Comments)    GI upset  . Losartan Other (See Comments)    Hot and chills  . Maxzide [Hydrochlorothiazide W-Triamterene] Other (See Comments)    Cramps   . Micardis [Telmisartan] Other (See Comments)    headache  . Nortriptyline Hcl Other (See Comments)    "My body doesn't metabolize it anymore."  . Oxycodone Other (See Comments)    Interstitial cystitis  . Oxycontin [Oxycodone Hcl] Other (See Comments)    Interstitial cystitis   . Pennsaid [Diclofenac Sodium] Hives  . Tramadol Itching    sedation  :   Her Current Medications Are:  Outpatient Encounter Prescriptions as of 04/12/2017  Medication Sig  . calcium gluconate 500 MG tablet Take 1 tablet by mouth 2 (two) times daily.  . cetirizine (ZYRTEC) 10 MG tablet Take 10 mg by mouth daily.  . Cholecalciferol (VITAMIN D-3) 1000 UNITS CAPS Take 1 capsule by mouth daily.  Marland Kitchen dicyclomine (BENTYL) 20 MG tablet Take 1 tablet (20 mg total) by mouth 2 (two) times daily as needed for spasms.  . DULoxetine  (CYMBALTA) 30 MG capsule Take 30 mg by mouth 2 (two) times daily.   Marland Kitchen HYDROcodone-acetaminophen (NORCO/VICODIN) 5-325 MG per tablet Take 0.5 tablets by mouth as needed.   . Lactobacillus (MORE-DOPHILUS ACIDOPHILUS PO) Take by mouth.  . levothyroxine (SYNTHROID, LEVOTHROID) 50 MCG tablet Take 50 mcg by mouth every morning.  . nitrofurantoin (MACRODANTIN) 100 MG capsule Take 100 mg by mouth as needed.  . ondansetron (ZOFRAN ODT) 4 MG disintegrating tablet Take 1 tablet (4 mg total) by mouth every 8 (eight) hours as needed for nausea or vomiting.  . ondansetron (ZOFRAN) 4 MG tablet Take 1 tab every 6 hours as needed for nausea.  . phenazopyridine (PYRIDIUM) 200 MG tablet Take 200 mg by mouth as needed for pain.  . polyethylene glycol (MIRALAX / GLYCOLAX) packet Take 17 g by mouth daily.  . predniSONE (DELTASONE) 1 MG tablet Take 1 mg by mouth 3 (three) times a week.  . RaNITidine HCl (ZANTAC 75 PO) Take by mouth.  . [DISCONTINUED] Digestive Enzymes (ENZYME DIGEST PO) Take by mouth.  . [DISCONTINUED] predniSONE (DELTASONE) 5 MG tablet Take 5 mg by mouth 3 (three) times a week.   No facility-administered encounter medications on file as of 04/12/2017.   :  Review of Systems:  Out of a complete 14 point review of systems, all are reviewed and negative with the exception of these symptoms as listed below: Review of Systems  Neurological:       Pt presents today to discuss her left sided temple pain that has been present since February of 2018. Pt has seen dentistry and a periodontist, [name]. It was suggested to the pt that her pain is coming from a nerve problem and not her teeth. Pt has had a root canal.     Objective:  Neurological Exam  Physical Exam Physical Examination:   Vitals:   04/12/17 0958  BP: 139/75  Pulse: 81   General Examination: The patient is a very pleasant 76 y.o. female in no acute distress. She appears well-developed and well-nourished and well groomed.    HEENT: Normocephalic, atraumatic, pupils are equal, round and reactive to light and accommodation. Extraocular tracking is good  without limitation to gaze excursion or nystagmus noted. Normal smooth pursuit is noted. Hearing is grossly intact. Face is symmetric with normal facial animation and normal facial sensation. Speech is clear with no dysarthria noted. There is no hypophonia. She has a very mild intermittent lower jaw tremor, very slight voice tremor, intermittent side-to-side head tremor, stable. Neck is supple with full range of passive and active motion. There are no carotid bruits on auscultation. Oropharynx exam reveals: unchanged.   Chest: Clear to auscultation without wheezing, rhonchi or crackles noted.  Heart: S1+S2+0, regular and normal without murmurs, rubs or gallops noted.   Abdomen: Soft, non-tender and non-distended with normal bowel sounds appreciated on auscultation.  Extremities: There is no pitting edema in the distal lower extremities bilaterally.  Skin: Warm and dry without trophic changes noted. There are no varicose veins.  Musculoskeletal: exam reveals no obvious joint deformities, tenderness or joint swelling or erythema.   Neurologically:  Mental status: The patient is awake, alert and oriented in all 4 spheres. Her immediate and remote memory, attention, language skills and fund of knowledge are appropriate. There is no evidence of aphasia, agnosia, apraxia or anomia. Speech is clear with normal prosody and enunciation. Thought process is linear. Mood is normal and affect is normal.  Cranial nerves II - XII are as described above under HEENT exam. In addition: shoulder shrug is normal with equal shoulder height noted. Motor exam: Normal bulk, strength and tone is noted. There is no resting tremor or rebound.   (On 01/08/16: Archimedes spiral drawing she has very mild tremulousness with the left hand. Handwriting is not tremulous, legible and not  micrographic.)   She has a very slight postural tremor in both hands, no significant action tremor. No intention tremor. Reflexes are 2+ throughout. Fine motor skills and coordination: intact with normal finger taps, normal hand movements, normal rapid alternating patting, normal foot taps and normal foot agility.  Cerebellar testing: No dysmetria or intention tremor.  Sensory exam: intact  in the upper and lower extremities. She stands up without difficulty. She can stand narrow based. She has preserved arm swing when walking and turns fine. She has no significant balance issues. She has normal stride length and pace, no shuffling noted.               Assessment and Plan:   In summary, Aleene Swanner is a very pleasant 76 year old female with an underlying medical history of osteoarthritis, allergic rhinitis, fibromyalgia, hyperlipidemia, osteopenia, left bundle branch block, tinnitus, hearing loss, hypertension, and recurrent headaches, and A central tremor, who presents for evaluation of her facial pain affecting the left upper face area exam is benign and stable. The description of her pain could point towards neuralgic pain, trigeminal neuralgia possible. She has see for this, ENT as well as an endodontist. She has a history of root canal treatments remotely in the past in the 80s and more recent root canal and crown replacement in the left lower jaw. Her pain starts in the left upper jaw area. Exam is nonfocal otherwise. I suggested we proceed with a brain I with and without contrast and symptomatic treatment with Tegretol low-dose. She previously has taken gabapentin in the past for other reasons and could not tolerate it.She's currently not on nortriptyline which she took in the past for her fibromyalgia but stopped working for her. She has never had a reaction to it. I suggested low-dose carbamazepine with Gradual titration. She has an appointment pending for follow-up  in January 2019 which I  asked her to keep. We will call her in the interim with her MRI results. I answered all her questions today and she was in agreement.Her tremor is stable and we will continue to monitor. I spent 25 minutes in total face-to-face time with the patient, more than 50% of which was spent in counseling and coordination of care, reviewing test results, reviewing medication and discussing or reviewing the diagnosis of TGN, ET, the prognosis and treatment options. Pertinent laboratory and imaging test results that were available during this visit with the patient were reviewed by me and considered in my medical decision making (see chart for details).

## 2017-04-13 ENCOUNTER — Telehealth: Payer: Self-pay | Admitting: Neurology

## 2017-04-13 MED ORDER — CARBAMAZEPINE 200 MG PO TABS: ORAL_TABLET | ORAL | 3 refills | Status: DC

## 2017-04-13 NOTE — Addendum Note (Signed)
Addended by: Lester [place] A on: 04/13/2017 10:51 AM   Modules accepted: Orders

## 2017-04-13 NOTE — Telephone Encounter (Signed)
Please resend Rx for carbamazepine (TEGRETOL) 200 MG tablet to [facility] on [address]. Pharmacy said did not get Rx.

## 2017-04-13 NOTE — Telephone Encounter (Signed)
Tegretol RX resent to [facility].

## 2017-04-26 DIAGNOSIS — D485 Neoplasm of uncertain behavior of skin: Secondary | ICD-10-CM | POA: Diagnosis not present

## 2017-04-26 DIAGNOSIS — L821 Other seborrheic keratosis: Secondary | ICD-10-CM | POA: Diagnosis not present

## 2017-04-26 DIAGNOSIS — D225 Melanocytic nevi of trunk: Secondary | ICD-10-CM | POA: Diagnosis not present

## 2017-04-26 DIAGNOSIS — D2262 Melanocytic nevi of left upper limb, including shoulder: Secondary | ICD-10-CM | POA: Diagnosis not present

## 2017-04-26 DIAGNOSIS — D1801 Hemangioma of skin and subcutaneous tissue: Secondary | ICD-10-CM | POA: Diagnosis not present

## 2017-04-26 DIAGNOSIS — L570 Actinic keratosis: Secondary | ICD-10-CM | POA: Diagnosis not present

## 2017-04-26 DIAGNOSIS — D2112 Benign neoplasm of connective and other soft tissue of left upper limb, including shoulder: Secondary | ICD-10-CM | POA: Diagnosis not present

## 2017-04-29 DIAGNOSIS — R0982 Postnasal drip: Secondary | ICD-10-CM | POA: Diagnosis not present

## 2017-04-29 DIAGNOSIS — E039 Hypothyroidism, unspecified: Secondary | ICD-10-CM | POA: Diagnosis not present

## 2017-04-29 DIAGNOSIS — J069 Acute upper respiratory infection, unspecified: Secondary | ICD-10-CM | POA: Diagnosis not present

## 2017-05-03 ENCOUNTER — Other Ambulatory Visit: Payer: Medicare Other

## 2017-05-04 ENCOUNTER — Ambulatory Visit
Admission: RE | Admit: 2017-05-04 | Discharge: 2017-05-04 | Disposition: A | Discharge status: Home / Self Care | Payer: Medicare Other | Source: Ambulatory Visit | Attending: Neurology | Admitting: Neurology

## 2017-05-04 DIAGNOSIS — G518 Other disorders of facial nerve: Secondary | ICD-10-CM

## 2017-05-04 MED ORDER — GADOBENATE DIMEGLUMINE 529 MG/ML IV SOLN
12.0000 mL | Freq: Once | INTRAVENOUS | Status: AC | PRN
  Administered 2017-05-04: 12 mL via INTRAVENOUS

## 2017-05-06 DIAGNOSIS — E039 Hypothyroidism, unspecified: Secondary | ICD-10-CM | POA: Diagnosis not present

## 2017-05-07 NOTE — Progress Notes (Signed)
Please call pt: Brain MRI w/wo contrast shows age appropriate findings, no acute changes. No obvious explanation for facial pain.  As discussed, will keep going with the tegretol for nerve pain in the face.  Star Age, MD, PhD [facility])

## 2017-05-10 ENCOUNTER — Telehealth: Payer: Self-pay

## 2017-05-10 NOTE — Telephone Encounter (Signed)
-----   Message from Star Age, MD sent at 05/07/2017 11:33 AM EST ----- Please call pt: Brain MRI w/wo contrast shows age appropriate findings, no acute changes. No obvious explanation for facial pain.  As discussed, will keep going with the tegretol for nerve pain in the face.  Star Age, MD, PhD [facility])

## 2017-05-10 NOTE — Telephone Encounter (Signed)
Pt returned my call. I advised her that her brain MRI showed age appropriate findings with no acute changes and no obvious explanation for facial pain. Dr. Rexene Alberts recommends that pt continue with tegretol for the nerve pain. Pt verbalized understanding of results.  Pt reports that she has stopped taking the tegretol as of 3 days ago. After pt titrated up to BID dosing, she developed significant grogginess, a sore throat, and itching on her ears. She backed down to 1/2 tablet daily but even that made her feel worse, and thus stopped taking the tegretol.  I advised pt that she has an appt coming up in January. Dr. Rexene Alberts may want to introduce new meds for facial pain at that appt, so the pt has time to recover from the s/e from the tegretol. Pt reports that her pain is tolerable, and she takes an ibuprofen if the pain is really bothering her. The facial pain seems to be at its worse when she goes to bed. I advised pt that I will discuss with Dr. Rexene Alberts the plan moving forward and call the pt back.

## 2017-05-10 NOTE — Telephone Encounter (Signed)
I called pt.  I advised her that Dr. Rexene Alberts recommends a trial of gabapentin down the road but does not recommend it quite yet. If pt has a flare in her pain, Dr. Rexene Alberts would consider this trial. Pt wants to keep her appt in January. Pt had no questions at this time but was encouraged to call back if questions arise.

## 2017-05-10 NOTE — Telephone Encounter (Signed)
I called pt to discuss her MRI results. No answer, left a message asking her to call me back. 

## 2017-05-10 NOTE — Telephone Encounter (Signed)
I think we may consider gabapentin down the Road but I would not recommend it quite yet. If she has a flareup in her pain I would recommend a trial of gabapentin. She can keep her January appointment or cancel it, it is up to her.

## 2017-05-12 DIAGNOSIS — J0100 Acute maxillary sinusitis, unspecified: Secondary | ICD-10-CM | POA: Diagnosis not present

## 2017-05-25 DIAGNOSIS — E039 Hypothyroidism, unspecified: Secondary | ICD-10-CM | POA: Diagnosis not present

## 2017-06-03 DIAGNOSIS — M19042 Primary osteoarthritis, left hand: Secondary | ICD-10-CM | POA: Diagnosis not present

## 2017-06-03 DIAGNOSIS — E039 Hypothyroidism, unspecified: Secondary | ICD-10-CM | POA: Diagnosis not present

## 2017-06-03 DIAGNOSIS — M150 Primary generalized (osteo)arthritis: Secondary | ICD-10-CM | POA: Diagnosis not present

## 2017-06-03 DIAGNOSIS — M159 Polyosteoarthritis, unspecified: Secondary | ICD-10-CM | POA: Diagnosis not present

## 2017-06-03 DIAGNOSIS — M25559 Pain in unspecified hip: Secondary | ICD-10-CM | POA: Diagnosis not present

## 2017-06-03 DIAGNOSIS — M797 Fibromyalgia: Secondary | ICD-10-CM | POA: Diagnosis not present

## 2017-06-03 DIAGNOSIS — M79643 Pain in unspecified hand: Secondary | ICD-10-CM | POA: Diagnosis not present

## 2017-06-03 DIAGNOSIS — M79642 Pain in left hand: Secondary | ICD-10-CM | POA: Diagnosis not present

## 2017-06-03 DIAGNOSIS — M25561 Pain in right knee: Secondary | ICD-10-CM | POA: Diagnosis not present

## 2017-06-03 DIAGNOSIS — M170 Bilateral primary osteoarthritis of knee: Secondary | ICD-10-CM | POA: Diagnosis not present

## 2017-06-03 DIAGNOSIS — M79641 Pain in right hand: Secondary | ICD-10-CM | POA: Diagnosis not present

## 2017-06-03 DIAGNOSIS — I10 Essential (primary) hypertension: Secondary | ICD-10-CM | POA: Diagnosis not present

## 2017-06-03 DIAGNOSIS — M19041 Primary osteoarthritis, right hand: Secondary | ICD-10-CM | POA: Diagnosis not present

## 2017-06-03 DIAGNOSIS — E7800 Pure hypercholesterolemia, unspecified: Secondary | ICD-10-CM | POA: Diagnosis not present

## 2017-06-08 IMAGING — CT CT ABD-PELV W/ CM
2 of 5 series · 16 of 46 positions shown, 18 images · IV contrast (APPLIED)
Comparison: CT scan of September 01, 2010.

CLINICAL DATA: Acute generalized abdominal pain.

EXAM:
CT ABDOMEN AND PELVIS WITH CONTRAST
TECHNIQUE: Multidetector CT imaging of the abdomen and pelvis was performed
using the standard protocol following bolus administration of
intravenous contrast.
CONTRAST:  80mL MIS3VP-PLL IOPAMIDOL (MIS3VP-PLL) INJECTION 61%

[Series 2: axial st · axial · 0.78mm/px · z∈[-476,-61]mm · 13 of 93 slices shown, 15 images]
[im 5/93  soft-tissue]
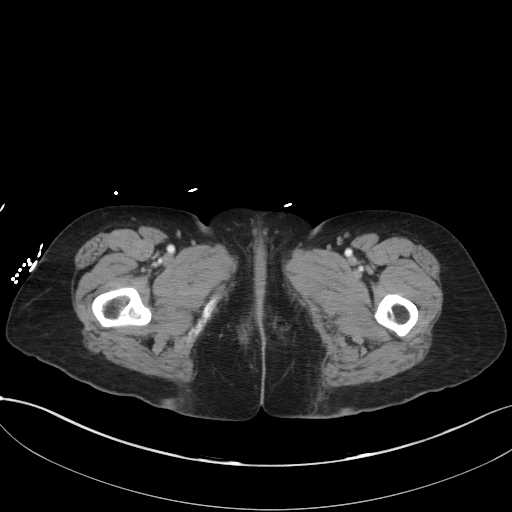
[im 5/93  bone]
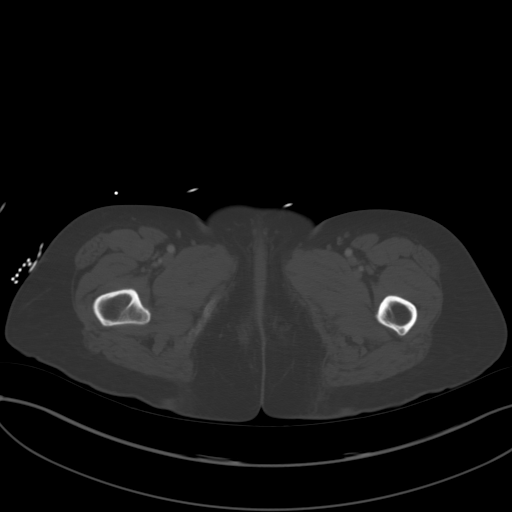
[im 14/93  soft-tissue]
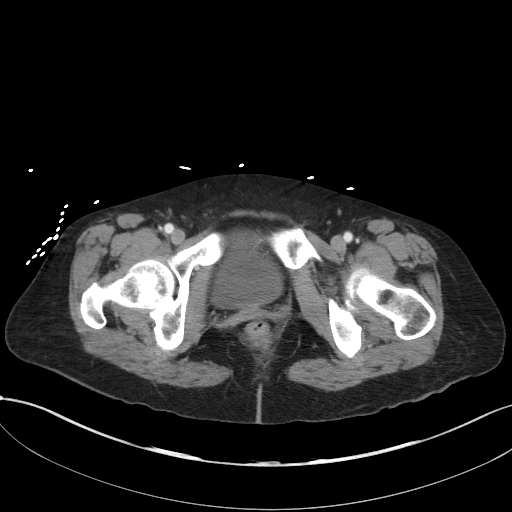
[im 19/93  soft-tissue]
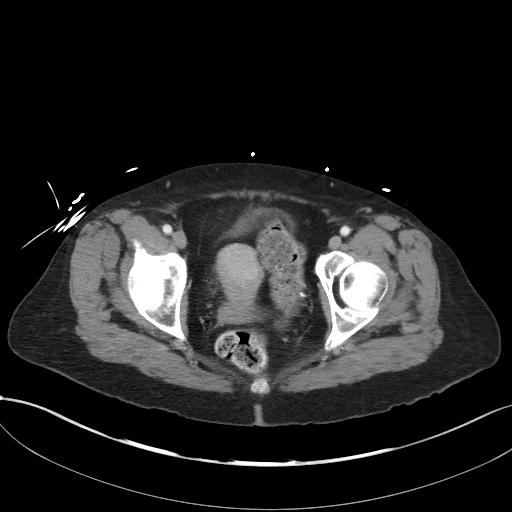
[im 28/93  soft-tissue]
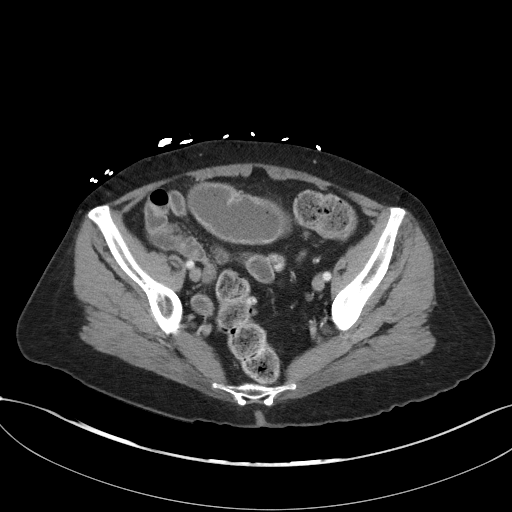
[im 33/93  soft-tissue]
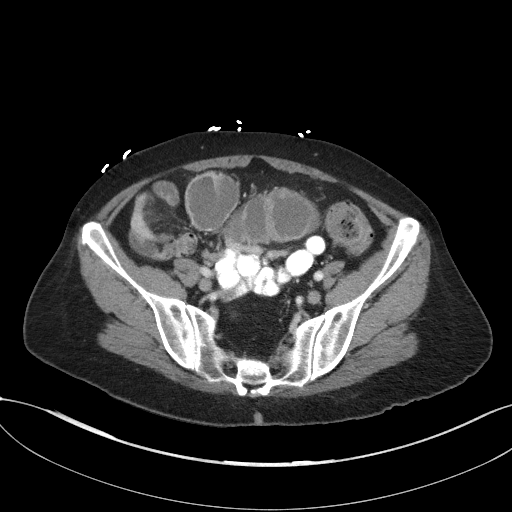
[im 42/93  soft-tissue]
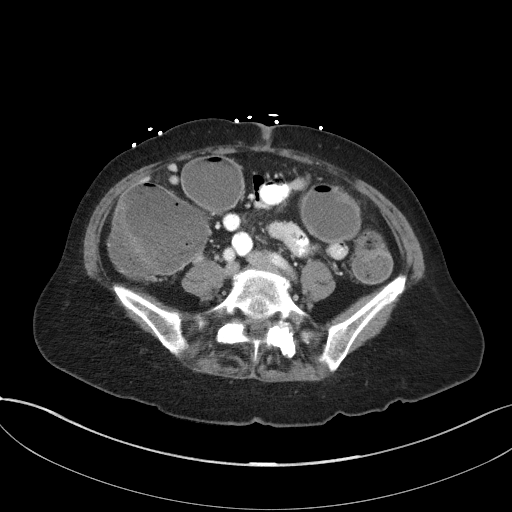
[im 47/93  soft-tissue]
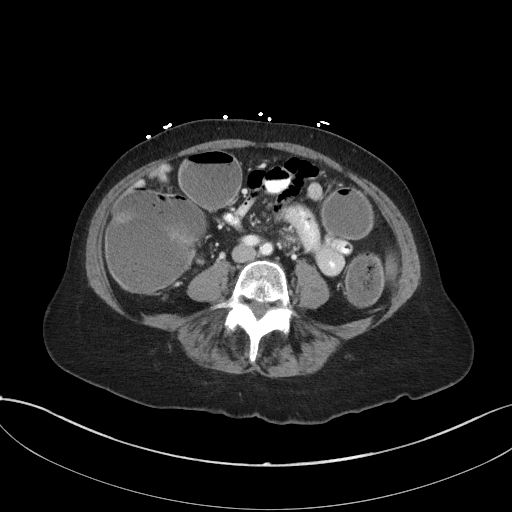
[im 51/93  soft-tissue]
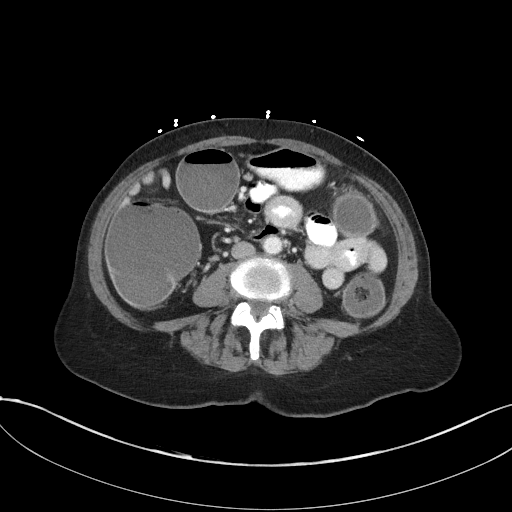
[im 60/93  soft-tissue]
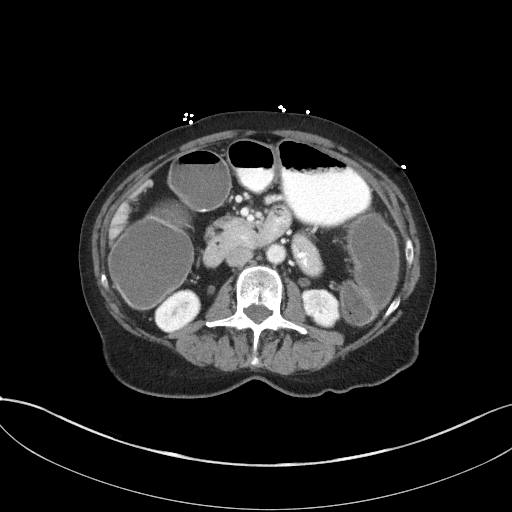
[im 60/93  bone]
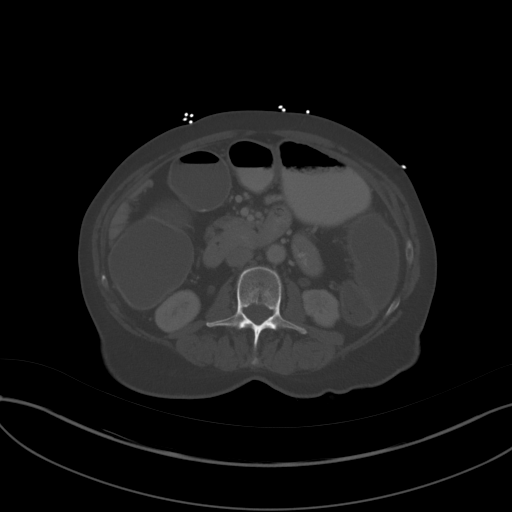
[im 65/93  soft-tissue]
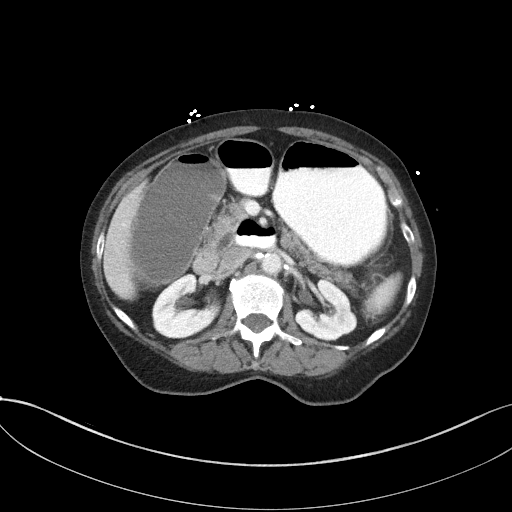
[im 74/93  soft-tissue]
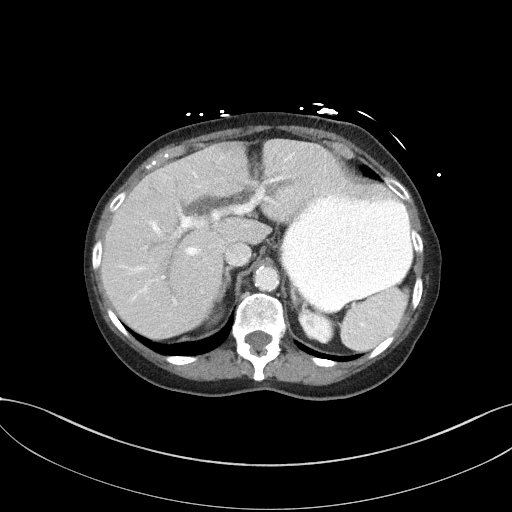
[im 79/93  soft-tissue]
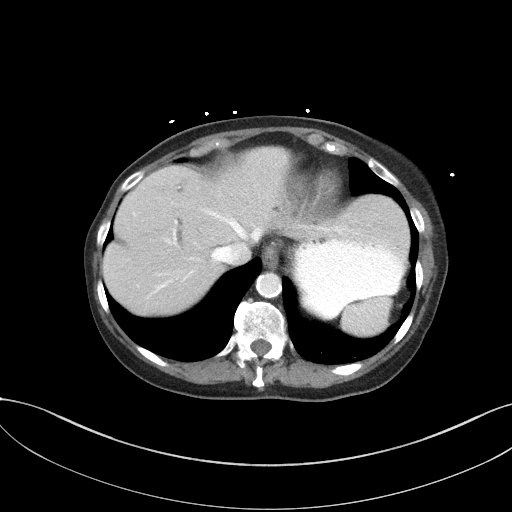
[im 88/93  soft-tissue]
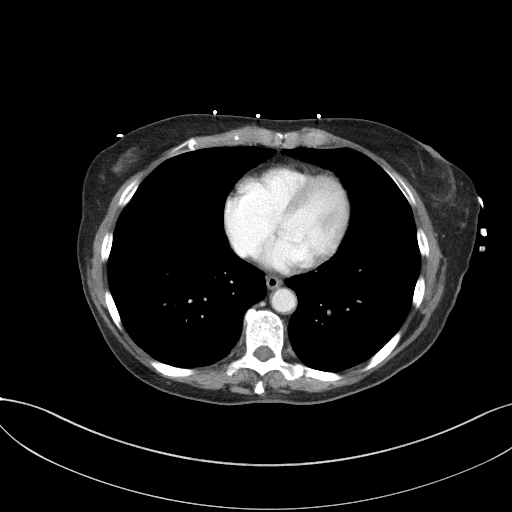

[Series 5: coronal st · coronal · 0.68mm/px · 3 of 99 slices shown]
[im 33/99  soft-tissue]
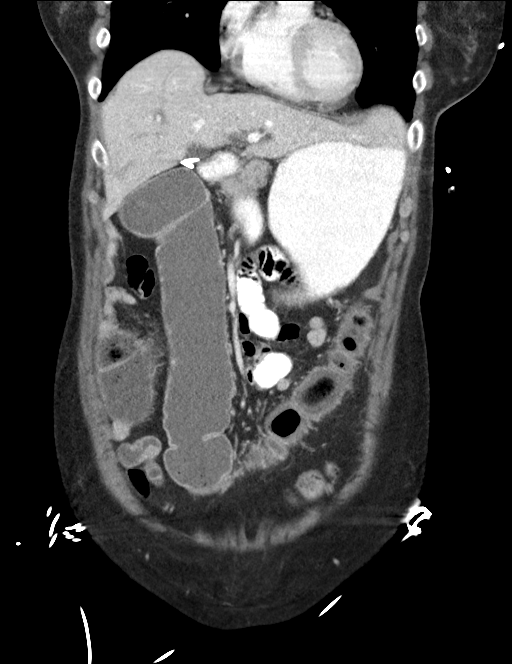
[im 44/99  soft-tissue]
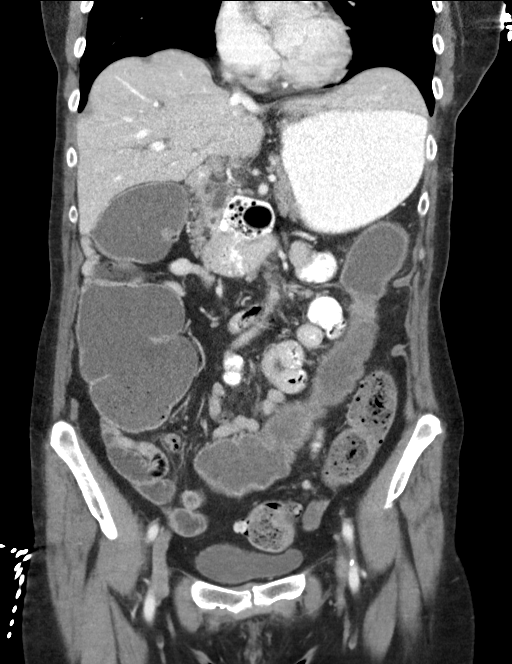
[im 55/99  soft-tissue]
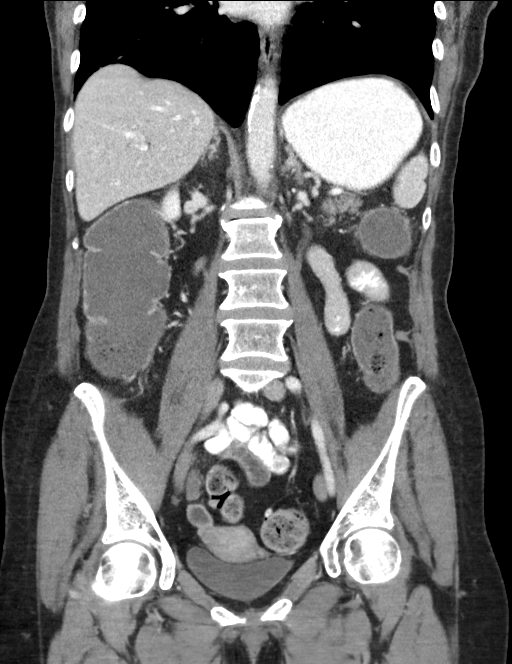

[16 of 46 positions shown; findings below may reference images not displayed]

FINDINGS: Lower chest: No acute abnormality.

Hepatobiliary: Status post cholecystectomy. Intrahepatic and
extrahepatic biliary dilatation is noted which may be due to post
cholecystectomy status, but correlation with liver function tests is
recommended to rule out obstruction. No focal abnormality is noted
in the liver.

Pancreas: Unremarkable. No pancreatic ductal dilatation or
surrounding inflammatory changes.

Spleen: Normal in size without focal abnormality. 1.5 cm splenic
artery aneurysm is noted and stable.

Adrenals/Urinary Tract: Adrenal glands are unremarkable. Kidneys are
normal, without renal calculi, focal lesion, or hydronephrosis.
Bladder is unremarkable.

Stomach/Bowel: No small bowel dilatation is noted. Large amount of
stool and fluid is noted within dilated colon.

Vascular/Lymphatic: No significant vascular findings are present. No
enlarged abdominal or pelvic lymph nodes.

Reproductive: Uterus and bilateral adnexa are unremarkable.

Other: No abdominal wall hernia or abnormality. No abdominopelvic
ascites.

Musculoskeletal: No acute or significant osseous findings.
IMPRESSION: Intrahepatic and extrahepatic biliary dilatation is noted which may
be due to post cholecystectomy status, but correlation with liver
function tests is recommended to rule out obstruction.

Stable 1.5 cm splenic artery aneurysm.

Dilated colon is noted filled with stool and fluid which may
represent constipation.

## 2017-07-14 ENCOUNTER — Ambulatory Visit (INDEPENDENT_AMBULATORY_CARE_PROVIDER_SITE_OTHER): Payer: Medicare Other | Admitting: Neurology

## 2017-07-14 ENCOUNTER — Encounter: Payer: Self-pay | Admitting: Neurology

## 2017-07-14 VITALS — BP 158/78 | HR 76 | Ht 63.0 in | Wt 138.0 lb

## 2017-07-14 DIAGNOSIS — G250 Essential tremor: Secondary | ICD-10-CM

## 2017-07-14 DIAGNOSIS — G518 Other disorders of facial nerve: Secondary | ICD-10-CM | POA: Diagnosis not present

## 2017-07-14 NOTE — Patient Instructions (Signed)
Your brain MRI looked good.  Your tremors are stable.  Your facial pain is tolerable at this time. You can continue with as needed Tylenol for this. I would like to see you back as needed at this point.  Please continue to stay active mentally and physically, stay well hydrated with water.  It was good to see you again.

## 2017-07-14 NOTE — Progress Notes (Signed)
Subjective:    Patient ID: [name] is a 77 y.o. female.  HPI     Interim history:  [name] is a very pleasant 77 year old right-handed woman with an underlying medical history of osteoarthritis, allergic rhinitis, fibromyalgia, hyperlipidemia, osteopenia, left bundle branch block, tinnitus, hearing loss, hypertension, and recurrent headaches, who presents for follow-up consultation of her facial pain. I last saw her on 04/12/2017, at which time she was reported by her rheumatologist for recurrent facial pain. She had seen dentist and endodontist for this. She reported left-sided facial pain with sensitivity to cold. 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 She had been treated for sinus infection and had undergone a CT sinuses as well. She was on a prednisone taper. She was describing a constant sharp and stabbing pain on the left side of her face. I suggested we proceed with MRI testing and a trial of carbamazepine.  She had a brain MRI with and without contrast on 05/04/2017 which I reviewed: IMPRESSION:   Unremarkable MRI brain (with and without). 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 No acute findings.   We called her with her test results.   Today, 07/14/2017: She reports doing okay. Had SEs on the carbamazepine, when she went up to the 2 pills daily. She has had sinus issues, sinus headache, for which she recently too Mucinex Sinus Max (expectorant, cough suppressant and decongestant). Half a pill of carbamazepine once daily may have helped a little bit but she feels that currently her facial pain is tolerable and off without any additional medication, she does take over-the-counter Tylenol and sometimes Motrin or Advil. She feels that her tremor is stable.   The patient's allergies, current medications, family history, past medical history, past social history, past surgical history and problem list were  reviewed and updated as appropriate.    Previously (copied from previous notes for reference):   04/12/2017: She reports recent onset of jaw pain. She saw her dentist and also an endodontist for this. I reviewed office records from Dr. Dorothyann Peng her endodontic specialist from 02/16/2017, he suspected possible neuralgic pain. Her pain is on the left side and she is sensitive to cold but this was felt to be within normal limits for her. She saw ENT and had a CT of her sinuses. She has had left upper dental pain and left ear pain for months. I reviewed an office note from ENT from February 2018. 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 CT sinuses from 08/17/2016 showed: IMPRESSION: Normal paranasal sinuses. No acute maxillary dental finding identified.   She is on a prednisone taper, now on 1 mg daily.  She bruises easily.    She had seen her dentist first, then the ENT, then the endodontist.  She had a crown replaced in Jan 2018 in the L lower back, molar. Then had root canal to the tooth adjacent to it.  She desribes the pain as constant, sharp, stabbing, at times prickly sensation, typically starts in the upper left jaw area, radiates sometimes to the ER, sometimes with a dull pain in the ear, sometimes radiates to the left side of the nose and feels like her left upper lip starts trembling when the pain is more intense. She is able to sleep at night but feels like the pain is worse at night. She has  a remote history of root canal to the left upper molars in the 80s, had some additional treatment later on.  I saw her on 07/13/2016 for her essential tremors. At that time she reported that her tremors became worse but in the most recent 3 weeks she felt a little better. She had started low-dose prednisone 5 mg every other day after seeing a new rheumatologist. She had a bout of nausea and vomiting  and significant constipation, went to the emergency room on 06/26/2016. She finished a recent course of antibiotics.   She saw Ward Givens and follow-up on 01/11/17, at which time her tremor was noted to be rather mild.   I first met her on 01/08/2016 at the request of her primary care physician, at which time she reported intermittent facial trembling and bilateral arm and hand tremors. Her history and physical exam are in keeping with mild essential tremor and I suggested we monitor her symptoms. She had recent blood work through her PCP. Her exam was otherwise nonfocal, she did give a family history of tremors in her sister.    01/08/2016: She reports intermittent facial trembling, typically first thing in the morning, intermittently for the past months, since April or so and bilateral arm tremors and hand tremors intermittently as well. I reviewed your office note from 12/31/2015, which you kindly included. She was noted to have a slight voice tremor. She reported no other trembling elsewhere. She denied restless leg symptoms.    She reports that she was treated for the flu and UTI around Easter and was first treated with ampicillin and then with Cipro. After she stopped the antibiotics she noted weakness and trembling, affecting both upper and lower extremities and the neck and jaw.    She has knee pain b/l and has received injections into the L knee.    She has a FHx of tremors in her sister who has a tremor of her hands or right hand. Sister has trouble feeding herself d/t tremor. Patient reports that sister is several years older. Sister's husband had Parkinson's disease and patient worries a little bit about that as well. She has issues with interstitial cystitis. She has been on physical therapy for this. She sees Dr. Roni Bread for this. She is trying to drink enough water and follow through with the exercises at home as well.   She thinks that her balance is not as good. She took a fall when  she was still dealing with her flu and UTI issues. This was at home and she fell against the dresser.   Patient is a non-smoker, does not drink alcohol and has very little caffeine, in the form of jasmine tea.    Addendum, 5:30 PM: I reviewed blood test results from 11/28/2015: BMP showed creatinine of 1.2, glucose 64, otherwise unremarkable, CK level normal at 42, ESR normal at 15, lipid panel showed total cholesterol of 202, LDL 134, triglycerides 118. TSH on 04/01/2015 was 3.64. Vitamin B12 on 07/16/2014 was 501, magnesium 2.1.    Her Past Medical History Is Significant For: Past Medical History:  Diagnosis Date  . Allergy   . Arthritis   . Chronic headaches   . Fibromyalgia   . Gallstones   . Hyperlipemia   . Hypertension   . Interstitial cystitis   . Lactose intolerance   . LBBB (left bundle branch block)   . Osteoarthritis   . Pneumonia   . Tinnitus     Her Past Surgical History Is  Significant For: Past Surgical History:  Procedure Laterality Date  . CHOLECYSTECTOMY  03/2010  . COLONOSCOPY  2009   Dr. Raford Pitcher   . CYSTOSCOPY     Multiple Cystoscopies with stents or biopsy   . TONSILLECTOMY      Her Family History Is Significant For: Family History  Problem Relation Age of Onset  . Heart disease Mother   . Heart disease Father   . Stroke Father   . Stroke Sister     Her Social History Is Significant For: Social History   Socioeconomic History  . Marital status: Married    Spouse name: None  . Number of children: 2  . Years of education: college  . Highest education level: None  Social Needs  . Financial resource strain: None  . Food insecurity - worry: None  . Food insecurity - inability: None  . Transportation needs - medical: None  . Transportation needs - non-medical: None  Occupational History  . Occupation: housewife    Employer: RETIRED  Tobacco Use  . Smoking status: Never Smoker  . Smokeless tobacco: Never Used  Substance and Sexual  Activity  . Alcohol use: No    Alcohol/week: 0.0 oz  . Drug use: No  . Sexual activity: None  Other Topics Concern  . None  Social History Narrative   1 cup of Jasmine tea daily     Her Allergies Are:  Allergies  Allergen Reactions  . Persantine [Dipyridamole] Other (See Comments)    Migraines   . Levothyroxine   . Butrans [Buprenorphine] Other (See Comments)    "knocks me out"  . Codeine Nausea Only  . Doxycycline Rash  . Esomeprazole Other (See Comments)    Jittery, sore throat  . Irbesartan Diarrhea  . Lactose Intolerance (Gi) Other (See Comments)    GI upset   . Lactulose Other (See Comments)    GI upset  . Losartan Other (See Comments)    Hot and chills  . Maxzide [Hydrochlorothiazide W-Triamterene] Other (See Comments)    Cramps   . Micardis [Telmisartan] Other (See Comments)    headache  . Nortriptyline Hcl Other (See Comments)    "My body doesn't metabolize it anymore."  . Oxycodone Other (See Comments)    Interstitial cystitis  . Oxycontin [Oxycodone Hcl] Other (See Comments)    Interstitial cystitis   . Pennsaid [Diclofenac Sodium] Hives  . Tramadol Itching    sedation  :   Her Current Medications Are:  Outpatient Encounter Medications as of 07/14/2017  Medication Sig  . calcium gluconate 500 MG tablet Take 1 tablet by mouth 2 (two) times daily.  . cetirizine (ZYRTEC) 10 MG tablet Take 10 mg by mouth daily.  . Cholecalciferol (VITAMIN D-3) 1000 UNITS CAPS Take 1 capsule by mouth daily.  Marland Kitchen dicyclomine (BENTYL) 20 MG tablet Take 1 tablet (20 mg total) by mouth 2 (two) times daily as needed for spasms.  . DULoxetine (CYMBALTA) 30 MG capsule Take 30 mg by mouth 2 (two) times daily.   Marland Kitchen HYDROcodone-acetaminophen (NORCO/VICODIN) 5-325 MG per tablet Take 0.5 tablets by mouth as needed.   . Lactobacillus (MORE-DOPHILUS ACIDOPHILUS PO) Take by mouth.  . levothyroxine (SYNTHROID, LEVOTHROID) 50 MCG tablet Take 50 mcg by mouth every morning.  . nitrofurantoin  (MACRODANTIN) 100 MG capsule Take 100 mg by mouth as needed.  . phenazopyridine (PYRIDIUM) 200 MG tablet Take 200 mg by mouth as needed for pain.  . polyethylene glycol (MIRALAX / GLYCOLAX) packet Take 17  g by mouth daily.  . predniSONE (DELTASONE) 5 MG tablet Take 5 mg by mouth 3 (three) times a week.  . RaNITidine HCl (ZANTAC 75 PO) Take by mouth.  . [DISCONTINUED] carbamazepine (TEGRETOL) 200 MG tablet Take 1/2 at bedtime for 1 week, then 1 pill at bedtime for 1 week, then 1 pill twice.  . [DISCONTINUED] ondansetron (ZOFRAN ODT) 4 MG disintegrating tablet Take 1 tablet (4 mg total) by mouth every 8 (eight) hours as needed for nausea or vomiting.  . [DISCONTINUED] ondansetron (ZOFRAN) 4 MG tablet Take 1 tab every 6 hours as needed for nausea.  . [DISCONTINUED] predniSONE (DELTASONE) 1 MG tablet Take 1 mg by mouth 3 (three) times a week.   No facility-administered encounter medications on file as of 07/14/2017.   :  Review of Systems:  Out of a complete 14 point review of systems, all are reviewed and negative with the exception of these symptoms as listed below:  Review of Systems  Neurological:       Pt presents today to discuss her tremors. Pt say that her tremors have been better. Pt says that she stopped taking the tegretol and decided that she can stand the facial pain rather than the side effects from the tegretol. Pt wants to discuss starting ALA for burning in her feet.    Objective:  Neurological Exam  Physical Exam Physical Examination:   Vitals:   07/14/17 1123  BP: (!) 158/78  Pulse: 76    General Examination: The patient is a very pleasant 77 y.o. female in no acute distress. She appears well-developed and well-nourished and well groomed. Good spirits.   HEENT:Normocephalic, atraumatic, pupils are equal, round and reactive to light and accommodation. Contact lenses in place. Mild cataracts noted, right more noticeable than left. Extraocular tracking is good without  limitation to gaze excursion or nystagmus noted. Normal smooth pursuit is noted. Hearing is grossly intact. Face is symmetric with normal facial animation and normal facial sensation. Speech is clear with no dysarthria noted. There is no hypophonia. She has a very mild intermittent lower jaw tremor, very slight voice tremor, intermittent side-to-side head tremor, stable. Neck is supple with full range of passive and active motion. There are no carotid bruits on auscultation. Oropharynx exam reveals: unchanged.   Chest:Clear to auscultation without wheezing, rhonchi or crackles noted.  Heart:S1+S2+0, regular and normal without murmurs, rubs or gallops noted.   Abdomen:Soft, non-tender and non-distended with normal bowel sounds appreciated on auscultation.  Extremities:There is 1+ pitting edema in the distal lower extremities bilaterally.   Skin: Warm and dry without trophic changes noted. There are no varicose veins.  Musculoskeletal: exam reveals no obvious joint deformities, tenderness or joint swelling or erythema.   Neurologically:  Mental status: The patient is awake, alert and oriented in all 4 spheres. Her immediate and remote memory, attention, language skills and fund of knowledge are appropriate. There is no evidence of aphasia, agnosia, apraxia or anomia. Speech is clear with normal prosody and enunciation. Thought process is linear. Mood is normal and affect is normal.  Cranial nerves II - XII are as described above under HEENT exam. In addition: shoulder shrug is normal with equal shoulder height noted. Motor exam: Normal bulk, strength and tone is noted. There is no resting tremor or rebound.   (On 01/08/16:Archimedes spiral drawing she has very mild tremulousness with the left hand. Handwriting is not tremulous, legible and not micrographic.)  She has a very mild postural tremor in both  hands, very little action tremor bilaterally. Fine motor skills are grossly  intact, reflexes about 1-2+ bilaterally.  Cerebellar testing: No dysmetria or intention tremor.  Sensory exam: intact in the upper and lower extremities. She stands up without difficulty. She can stand narrow based. She has preserved arm swing when walking and turns fine. She has no significant balance issues. She has normal stride length and pace, no shuffling noted.  Assessment and Plan:   In summary, [name] is a very pleasant 77 year old female with an underlying medical history of osteoarthritis, allergic rhinitis, fibromyalgia, hyperlipidemia, osteopenia, left bundle branch block, tinnitus, hearing loss, hypertension, and recurrent headaches, and A central tremor, who presents for follow-up consultation of her left facial pain. She also routinely follows up for her longer standing history of tremor in keeping with essential tremor. For her facial pain, she had seen ENT as well as her dentist and an endodontist. She has a history of root canal treatments in the distant past but also root canal treatment and crown placement more recently. Her brain MRI was benign and she is reassured in that regard. Neurological exam is stable. She tried Tegretol with no significant improvement and some side effects reported. She is currently no longer on it. She takes Tylenol or over-the-counter nonsteroidal medication as needed. She does have knee pain and low back pain for which she takes these medicines. She also has a prescription for Tylenol with codeine as I understand. She uses this sparingly. At this juncture I suggested as needed follow-up. She is agreeable with this. I answered all her questions today and she will call if she needs another appointment. Of note, she has previously taken gabapentin in the past for other reasons and could not tolerate it. She had been on nortriptyline in the past for her fibromyalgia but it stopped working for her.  I spent 25 minutes in total face-to-face  time with the patient, more than 50% of which was spent in counseling and coordination of care, reviewing test results, reviewing medication and discussing or reviewing the diagnosis of essential tremor, neuralgic pain, the prognosis and treatment options. Pertinent laboratory and imaging test results that were available during this visit with the patient were reviewed by me and considered in my medical decision making (see chart for details).

## 2017-07-15 DIAGNOSIS — M797 Fibromyalgia: Secondary | ICD-10-CM | POA: Diagnosis not present

## 2017-07-15 DIAGNOSIS — M25562 Pain in left knee: Secondary | ICD-10-CM | POA: Diagnosis not present

## 2017-07-15 DIAGNOSIS — M170 Bilateral primary osteoarthritis of knee: Secondary | ICD-10-CM | POA: Diagnosis not present

## 2017-07-15 DIAGNOSIS — M150 Primary generalized (osteo)arthritis: Secondary | ICD-10-CM | POA: Diagnosis not present

## 2017-07-15 DIAGNOSIS — M25559 Pain in unspecified hip: Secondary | ICD-10-CM | POA: Diagnosis not present

## 2017-07-22 ENCOUNTER — Encounter: Payer: Self-pay | Admitting: Internal Medicine

## 2017-07-26 ENCOUNTER — Telehealth: Payer: Self-pay | Admitting: Neurology

## 2017-07-26 NOTE — Telephone Encounter (Signed)
Ok to restart the carbamazepine 200 mg strength half a pill at bedtime. She should have pills left from her previous prescription. Please notify pt.

## 2017-07-26 NOTE — Telephone Encounter (Signed)
I called pt. I advised her that Dr. Rexene Alberts is agreeable to pt restarting carbamazepine 200mg  1/2 tablet qhs. Pt verbalized understanding.

## 2017-07-26 NOTE — Telephone Encounter (Signed)
Pt called she is having bad HA's since 07/03/17  and facial pain flor the past 2 weeks. She is wanting to know if she could take carbamazepine 200mg  1/2 tab at bedtime. Please call to advise.

## 2017-08-05 DIAGNOSIS — E039 Hypothyroidism, unspecified: Secondary | ICD-10-CM | POA: Diagnosis not present

## 2017-08-05 DIAGNOSIS — E7800 Pure hypercholesterolemia, unspecified: Secondary | ICD-10-CM | POA: Diagnosis not present

## 2017-08-05 DIAGNOSIS — M170 Bilateral primary osteoarthritis of knee: Secondary | ICD-10-CM | POA: Diagnosis not present

## 2017-08-05 DIAGNOSIS — M159 Polyosteoarthritis, unspecified: Secondary | ICD-10-CM | POA: Diagnosis not present

## 2017-08-05 DIAGNOSIS — I10 Essential (primary) hypertension: Secondary | ICD-10-CM | POA: Diagnosis not present

## 2017-08-16 DIAGNOSIS — N3946 Mixed incontinence: Secondary | ICD-10-CM | POA: Diagnosis not present

## 2017-08-16 DIAGNOSIS — N3010 Interstitial cystitis (chronic) without hematuria: Secondary | ICD-10-CM | POA: Diagnosis not present

## 2017-08-16 DIAGNOSIS — Z87440 Personal history of urinary (tract) infections: Secondary | ICD-10-CM | POA: Diagnosis not present

## 2017-08-19 DIAGNOSIS — J329 Chronic sinusitis, unspecified: Secondary | ICD-10-CM | POA: Diagnosis not present

## 2017-08-19 DIAGNOSIS — J309 Allergic rhinitis, unspecified: Secondary | ICD-10-CM | POA: Diagnosis not present

## 2017-08-19 DIAGNOSIS — E7800 Pure hypercholesterolemia, unspecified: Secondary | ICD-10-CM | POA: Diagnosis not present

## 2017-08-19 DIAGNOSIS — J111 Influenza due to unidentified influenza virus with other respiratory manifestations: Secondary | ICD-10-CM | POA: Diagnosis not present

## 2017-08-19 DIAGNOSIS — I10 Essential (primary) hypertension: Secondary | ICD-10-CM | POA: Diagnosis not present

## 2017-08-19 DIAGNOSIS — J069 Acute upper respiratory infection, unspecified: Secondary | ICD-10-CM | POA: Diagnosis not present

## 2017-09-07 DIAGNOSIS — I10 Essential (primary) hypertension: Secondary | ICD-10-CM | POA: Diagnosis not present

## 2017-09-07 DIAGNOSIS — H9203 Otalgia, bilateral: Secondary | ICD-10-CM | POA: Diagnosis not present

## 2017-09-07 DIAGNOSIS — J3489 Other specified disorders of nose and nasal sinuses: Secondary | ICD-10-CM | POA: Diagnosis not present

## 2017-09-24 DIAGNOSIS — R51 Headache: Secondary | ICD-10-CM | POA: Diagnosis not present

## 2017-09-24 DIAGNOSIS — J301 Allergic rhinitis due to pollen: Secondary | ICD-10-CM | POA: Diagnosis not present

## 2017-10-18 DIAGNOSIS — M7060 Trochanteric bursitis, unspecified hip: Secondary | ICD-10-CM | POA: Diagnosis not present

## 2017-10-18 DIAGNOSIS — M797 Fibromyalgia: Secondary | ICD-10-CM | POA: Diagnosis not present

## 2017-10-18 DIAGNOSIS — M25559 Pain in unspecified hip: Secondary | ICD-10-CM | POA: Diagnosis not present

## 2017-10-18 DIAGNOSIS — M25562 Pain in left knee: Secondary | ICD-10-CM | POA: Diagnosis not present

## 2017-10-18 DIAGNOSIS — M170 Bilateral primary osteoarthritis of knee: Secondary | ICD-10-CM | POA: Diagnosis not present

## 2017-10-18 DIAGNOSIS — M150 Primary generalized (osteo)arthritis: Secondary | ICD-10-CM | POA: Diagnosis not present

## 2017-10-20 DIAGNOSIS — Z6826 Body mass index (BMI) 26.0-26.9, adult: Secondary | ICD-10-CM | POA: Diagnosis not present

## 2017-10-20 DIAGNOSIS — N3945 Continuous leakage: Secondary | ICD-10-CM | POA: Diagnosis not present

## 2017-10-20 DIAGNOSIS — Z1231 Encounter for screening mammogram for malignant neoplasm of breast: Secondary | ICD-10-CM | POA: Diagnosis not present

## 2017-10-20 DIAGNOSIS — Z01419 Encounter for gynecological examination (general) (routine) without abnormal findings: Secondary | ICD-10-CM | POA: Diagnosis not present

## 2017-11-02 DIAGNOSIS — M8588 Other specified disorders of bone density and structure, other site: Secondary | ICD-10-CM | POA: Diagnosis not present

## 2017-11-02 DIAGNOSIS — Z1211 Encounter for screening for malignant neoplasm of colon: Secondary | ICD-10-CM | POA: Diagnosis not present

## 2017-11-02 DIAGNOSIS — N958 Other specified menopausal and perimenopausal disorders: Secondary | ICD-10-CM | POA: Diagnosis not present

## 2017-11-02 DIAGNOSIS — Z1212 Encounter for screening for malignant neoplasm of rectum: Secondary | ICD-10-CM | POA: Diagnosis not present

## 2017-11-02 LAB — COLOGUARD

## 2017-11-10 ENCOUNTER — Encounter: Payer: Self-pay | Admitting: Neurology

## 2017-11-10 ENCOUNTER — Ambulatory Visit (INDEPENDENT_AMBULATORY_CARE_PROVIDER_SITE_OTHER): Payer: Medicare Other | Admitting: Neurology

## 2017-11-10 ENCOUNTER — Telehealth: Payer: Self-pay | Admitting: Neurology

## 2017-11-10 VITALS — BP 123/73 | HR 74 | Ht 63.0 in | Wt 141.0 lb

## 2017-11-10 DIAGNOSIS — G500 Trigeminal neuralgia: Secondary | ICD-10-CM

## 2017-11-10 DIAGNOSIS — G250 Essential tremor: Secondary | ICD-10-CM | POA: Diagnosis not present

## 2017-11-10 DIAGNOSIS — K068 Other specified disorders of gingiva and edentulous alveolar ridge: Secondary | ICD-10-CM

## 2017-11-10 DIAGNOSIS — R51 Headache: Secondary | ICD-10-CM | POA: Diagnosis not present

## 2017-11-10 DIAGNOSIS — R519 Headache, unspecified: Secondary | ICD-10-CM

## 2017-11-10 MED ORDER — OXCARBAZEPINE 150 MG PO TABS: 150.0000 mg | ORAL_TABLET | Freq: Two times a day (BID) | ORAL | 5 refills | Status: DC

## 2017-11-10 NOTE — Progress Notes (Signed)
Subjective:    Patient ID: [name] is a 77 y.o. female.  HPI     Interim history:   [name] is a very pleasant 77 year old right-handed woman with an underlying medical history of osteoarthritis, allergic rhinitis, fibromyalgia, hyperlipidemia, osteopenia, left bundle branch block, tinnitus, hearing loss, hypertension, and recurrent headaches, who presents for follow-up consultation of her facial pain. The patient is unaccompanied today. I last saw her on 07/14/2017, at which time she reported doing okay, she hadn't stop taking her carbamazepine as she had side effects on the 2 pill daily, felt like she was having sinus headaches and sinus congestion. She took Mucinex over-the-counter for that. She felt that half a pill of carbamazepine once daily may have helped a little bit but overall felt that her facial pain was tolerable enough to use over-the-counter Tylenol or Motrin or Advil. I suggested as needed follow-up. She called in the interim in February 2019 reporting a flareup in her facial pain and requesting to get restarted on Tegretol half a pill daily. She was advised that she could certainly restart at a low dose if helpful.  Today, 11/10/2017: She reports her facial pain is daily, she has pain in her upper jaw area and some areas sometimes. She has noted urinary incontinence when she took the Tegretol, she stopped it about 3 weeks ago. She also reports low white blood count in the recent past. She is scheduled for blood work next month with her primary care physician. She felt that the Tegretol was also too sedating in the daytime even though she only took 1 pill at night, she did have reasonable pain control from it. Sometimes she uses a ointment for gum pain which takes the edge off. She also takes Tylenol every day and Motrin every day, approximately 2 pill each on average. She has noticed some headaches, is wondering if the lower prednisone dose is also contributing to flareup in  her pain. She is not as active as she used to be. She likes to play tennis and feels like she is not as motivated. She has noted a flareup in her tremors when she is in pain and also when she does not sleep enough. She tends to sleep until 9 AM unless she has an appointment. She feels better with regards to her pain and tremor when she is able to sleep till about 9 AM.  The patient's allergies, current medications, family history, past medical history, past social history, past surgical history and problem list were reviewed and updated as appropriate.    Previously (copied from previous notes for reference):    I saw her on 04/12/2017, at which time she was reported by her rheumatologist for recurrent facial pain. She had seen dentist and endodontist for this. She reported left-sided facial pain with sensitivity to cold. 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 She had been treated for sinus infection and had undergone a CT sinuses as well. She was on a prednisone taper. She was describing a constant sharp and stabbing pain on the left side of her face. I suggested we proceed with MRI testing and a trial of carbamazepine.   She had a brain MRI with and without contrast on 05/04/2017 which I reviewed: IMPRESSION:    Unremarkable MRI brain (with and without). 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 No acute findings.   We called her with her test results.  04/12/2017: She reports recent onset of jaw pain. She saw her dentist and also an endodontist for this. I reviewed office records from Dr. Dorothyann Peng her endodontic specialist from 02/16/2017, he suspected possible neuralgic pain. Her pain is on the left side and she is sensitive to cold but this was felt to be within normal limits for her. She saw ENT and had a CT of her sinuses. She has had left upper dental pain and left ear pain for months. I reviewed an office note from  ENT from February 2018. 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 CT sinuses from 08/17/2016 showed: IMPRESSION: Normal paranasal sinuses. No acute maxillary dental finding identified.   She is on a prednisone taper, now on 1 mg daily.  She bruises easily.    She had seen her dentist first, then the ENT, then the endodontist.  She had a crown replaced in Jan 2018 in the L lower back, molar. Then had root canal to the tooth adjacent to it.  She desribes the pain as constant, sharp, stabbing, at times prickly sensation, typically starts in the upper left jaw area, radiates sometimes to the ER, sometimes with a dull pain in the ear, sometimes radiates to the left side of the nose and feels like her left upper lip starts trembling when the pain is more intense. She is able to sleep at night but feels like the pain is worse at night. She has a remote history of root canal to the left upper molars in the 80s, had some additional treatment later on.   I saw her on 07/13/2016 for her essential tremors. At that time she reported that her tremors became worse but in the most recent 3 weeks she felt a little better. She had started low-dose prednisone 5 mg every other day after seeing a new rheumatologist. She had a bout of nausea and vomiting and significant constipation, went to the emergency room on 06/26/2016. She finished a recent course of antibiotics.   She saw Ward Givens in follow-up on 01/11/17, at which time her tremor was noted to be rather mild.   I first met her on 01/08/2016 at the request of her primary care physician, at which time she reported intermittent facial trembling and bilateral arm and hand tremors. Her history and physical exam are in keeping with mild essential tremor and I suggested we monitor her symptoms. She had recent blood work through her PCP. Her exam was otherwise  nonfocal, she did give a family history of tremors in her sister.    01/08/2016: She reports intermittent facial trembling, typically first thing in the morning, intermittently for the past months, since April or so and bilateral arm tremors and hand tremors intermittently as well. I reviewed your office note from 12/31/2015, which you kindly included. She was noted to have a slight voice tremor. She reported no other trembling elsewhere. She denied restless leg symptoms.    She reports that she was treated for the flu and UTI around Easter and was first treated with ampicillin and then with Cipro. After she stopped the antibiotics she noted weakness and trembling, affecting both upper and lower extremities and the neck and jaw.    She has knee pain b/l and has received injections into the L knee.    She has a FHx of tremors in her sister who has a tremor of her  hands or right hand. Sister has trouble feeding herself d/t tremor. Patient reports that sister is several years older. Sister's husband had Parkinson's disease and patient worries a little bit about that as well. She has issues with interstitial cystitis. She has been on physical therapy for this. She sees Dr. Roni Bread for this. She is trying to drink enough water and follow through with the exercises at home as well.   She thinks that her balance is not as good. She took a fall when she was still dealing with her flu and UTI issues. This was at home and she fell against the dresser.   Patient is a non-smoker, does not drink alcohol and has very little caffeine, in the form of jasmine tea.    Addendum, 5:30 PM: I reviewed blood test results from 11/28/2015: BMP showed creatinine of 1.2, glucose 64, otherwise unremarkable, CK level normal at 42, ESR normal at 15, lipid panel showed total cholesterol of 202, LDL 134, triglycerides 118. TSH on 04/01/2015 was 3.64. Vitamin B12 on 07/16/2014 was 501, magnesium 2.1.  Her Past Medical History Is  Significant For: Past Medical History:  Diagnosis Date  . Allergy   . Arthritis   . Chronic headaches   . Fibromyalgia   . Gallstones   . Hyperlipemia   . Hypertension   . Interstitial cystitis   . Lactose intolerance   . LBBB (left bundle branch block)   . Osteoarthritis   . Pneumonia   . Tinnitus     Her Past Surgical History Is Significant For: Past Surgical History:  Procedure Laterality Date  . CHOLECYSTECTOMY  03/2010  . COLONOSCOPY  2009   Dr. Raford Pitcher   . CYSTOSCOPY     Multiple Cystoscopies with stents or biopsy   . TONSILLECTOMY      Her Family History Is Significant For: Family History  Problem Relation Age of Onset  . Heart disease Mother   . Heart disease Father   . Stroke Father   . Stroke Sister     Her Social History Is Significant For: Social History   Socioeconomic History  . Marital status: Married    Spouse name: Not on file  . Number of children: 2  . Years of education: college  . Highest education level: Not on file  Occupational History  . Occupation: housewife    Employer: RETIRED  Social Needs  . Financial resource strain: Not on file  . Food insecurity:    Worry: Not on file    Inability: Not on file  . Transportation needs:    Medical: Not on file    Non-medical: Not on file  Tobacco Use  . Smoking status: Never Smoker  . Smokeless tobacco: Never Used  Substance and Sexual Activity  . Alcohol use: No    Alcohol/week: 0.0 oz  . Drug use: No  . Sexual activity: Not on file  Lifestyle  . Physical activity:    Days per week: Not on file    Minutes per session: Not on file  . Stress: Not on file  Relationships  . Social connections:    Talks on phone: Not on file    Gets together: Not on file    Attends religious service: Not on file    Active member of club or organization: Not on file    Attends meetings of clubs or organizations: Not on file    Relationship status: Not on file  Other Topics Concern  . Not  on  file  Social History Narrative   1 cup of Jasmine tea daily     Her Allergies Are:  Allergies  Allergen Reactions  . Persantine [Dipyridamole] Other (See Comments)    Migraines   . Levothyroxine   . Butrans [Buprenorphine] Other (See Comments)    "knocks me out"  . Codeine Nausea Only  . Doxycycline Rash  . Esomeprazole Other (See Comments)    Jittery, sore throat  . Irbesartan Diarrhea  . Lactose Intolerance (Gi) Other (See Comments)    GI upset   . Lactulose Other (See Comments)    GI upset  . Losartan Other (See Comments)    Hot and chills  . Maxzide [Hydrochlorothiazide W-Triamterene] Other (See Comments)    Cramps   . Micardis [Telmisartan] Other (See Comments)    headache  . Nortriptyline Hcl Other (See Comments)    "My body doesn't metabolize it anymore."  . Oxycodone Other (See Comments)    Interstitial cystitis  . Oxycontin [Oxycodone Hcl] Other (See Comments)    Interstitial cystitis   . Pennsaid [Diclofenac Sodium] Hives  . Tramadol Itching    sedation  :   Her Current Medications Are:  Outpatient Encounter Medications as of 11/10/2017  Medication Sig  . cetirizine (ZYRTEC) 10 MG tablet Take 10 mg by mouth daily.  . Cholecalciferol (VITAMIN D-3) 1000 UNITS CAPS Take 1 capsule by mouth daily.  Marland Kitchen dicyclomine (BENTYL) 20 MG tablet Take 1 tablet (20 mg total) by mouth 2 (two) times daily as needed for spasms.  . DULoxetine (CYMBALTA) 30 MG capsule Take 30 mg by mouth 2 (two) times daily.   Marland Kitchen HYDROcodone-acetaminophen (NORCO/VICODIN) 5-325 MG per tablet Take 0.5 tablets by mouth as needed.   . Lactobacillus (MORE-DOPHILUS ACIDOPHILUS PO) Take by mouth.  . levothyroxine (SYNTHROID, LEVOTHROID) 50 MCG tablet Take 50 mcg by mouth every morning.  . predniSONE 2 MG TBEC Take 2 mg by mouth daily.  . RaNITidine HCl (ZANTAC 75 PO) Take by mouth.  . [DISCONTINUED] calcium gluconate 500 MG tablet Take 1 tablet by mouth 2 (two) times daily.  . [DISCONTINUED]  carbamazepine (TEGRETOL) 200 MG tablet Take 100 mg by mouth at bedtime.  . [DISCONTINUED] nitrofurantoin (MACRODANTIN) 100 MG capsule Take 100 mg by mouth as needed.  . [DISCONTINUED] phenazopyridine (PYRIDIUM) 200 MG tablet Take 200 mg by mouth as needed for pain.  . [DISCONTINUED] polyethylene glycol (MIRALAX / GLYCOLAX) packet Take 17 g by mouth daily.  . [DISCONTINUED] predniSONE (DELTASONE) 5 MG tablet Take 5 mg by mouth 3 (three) times a week.   No facility-administered encounter medications on file as of 11/10/2017.   :  Review of Systems:  Out of a complete 14 point review of systems, all are reviewed and negative with the exception of these symptoms as listed below: Review of Systems  Neurological:       Pt presents today to discuss her facial pain. Pt's facial pain is worsening. She stopped taking tegretol because it made her incontinent at night.    Objective:  Neurological Exam  Physical Exam Physical Examination:   Vitals:   11/10/17 1020  BP: 123/73  Pulse: 74    General Examination: The patient is a very pleasant 77 y.o. female in no acute distress. She appears well-developed and well-nourished and well groomed.   HEENT:Normocephalic, atraumatic, pupils are equal, round and reactive to light and accommodation. Mild cataracts noted, right more noticeable than left. Extraocular tracking is good without limitation to gaze excursion or  nystagmus noted. Normal smooth pursuit is noted. Hearing is grossly intact. Face is symmetric with normal facial animation and normal facial sensation. Speech is clear with no dysarthria noted. There is no hypophonia. She has a very mild intermittent lower jaw tremor, very slight voice tremor, stable. Neck is supple with full range of passive and active motion. Oropharynx exam reveals:unchanged.  Chest:Clear to auscultation without wheezing, rhonchi or crackles noted.  Heart:S1+S2+0, regular and normal without murmurs, rubs or gallops  noted.   Abdomen:Soft, non-tender and non-distended with normal bowel sounds appreciated on auscultation.  Extremities:There is 1+ pitting edema in the distal lower extremities bilaterally.   Skin: Warm and dry without trophic changes noted. There are no varicose veins.  Musculoskeletal: exam reveals no obvious joint deformities, tenderness or joint swelling or erythema.   Neurologically:  Mental status: The patient is awake, alert and oriented in all 4 spheres. Her immediate and remote memory, attention, language skills and fund of knowledge are appropriate. There is no evidence of aphasia, agnosia, apraxia or anomia. Speech is clear with normal prosody and enunciation. Thought process is linear. Mood is normal and affect is normal.  Cranial nerves II - XII are as described above under HEENT exam. In addition: shoulder shrug is normal with equal shoulder height noted. Motor exam: Normal bulk, strength and tone is noted. There is no resting tremor or rebound.   (On 01/08/16:Archimedes spiral drawing she has very mild tremulousness with the left hand. Handwriting is not tremulous, legible and not micrographic.)  She has a very mild postural tremor in both hands, very little action tremor bilaterally. Fine motor skills are grossly intact.  Cerebellar testing: No dysmetria or intention tremor.  Sensory exam: intact in the upper and lower extremities.  Gait, station and balance are all normal.   Assessment and Plan:   In summary, Toshiye Kever is a very pleasant 77 year old female with an underlying medical history of osteoarthritis, allergic rhinitis, fibromyalgia, hyperlipidemia, osteopenia, left bundle branch block, tinnitus, hearing loss, hypertension, and recurrent headaches,and essential tremor, who presents for follow-up consultation of her left facial pain. She has a longstanding history of essential tremor. For her facial pain, she had seen ENT as well as her  dentist and an endodontist. She was told that her sinuses are clear. She is having more headaches, could be rebound headaches, since she is taking Tylenol and Ibuprofen daily. She has a history of root canal treatments in the distant past but also root canal treatment and crown placement in the more recent past. Her brain MRI was benign in 11/18, and she is reassured in that regard. Neurological exam is stable. She tried Tegretol with some improvement noted, but side effects reported, particularly sleepiness,and nocturnal urinary incontinence. She stopped taking it about 3 weeks ago. Incidentally, she mentions that she had blood work in the recent past that showed a lower white cell count. It could've been related to Tegretol I explained to her. She has an upcoming blood testing through her primary care physician next month. She is advised to monitor her white cell count. She is furthermore advised to get her A1c checked as she mentioned burning sensation in her feet and some numbness. Which an over-the-counter ointment has helped. She also reports having had issues with gabapentin in the past when she took it for back pain including sleepiness, balance issue and incontinence.  Of note, she has also been on nortriptyline in the past for her fibromyalgia but it stopped working for her -  it is listed under allergies but she never actually had an allergic reaction to it. I suggested we start her on Trileptal with gradual titration. I also would like to proceed with a trigeminal MRI to further delineate any other aberrant blood vessel or abnormality in the trigeminal area. She may be a candidate for gamma knife and we may request a consultation for this. Our treatment options are limited, unfortunately she has had some medication intolerances in the past, several allergies, and age is also a factor to be considered. I provided written instructions and suggested a follow-up in about 3-4 months with Jinny Blossom, nurse  practitioner. I answered all her questions today and the patient was in agreement with the plan. I spent 30 minutes in total face-to-face time with the patient, more than 50% of which was spent in counseling and coordination of care, reviewing test results, reviewing medication and discussing or reviewing the diagnosis of TGN, facial pain, ET, the prognosis and treatment options. Pertinent laboratory and imaging test results that were available during this visit with the patient were reviewed by me and considered in my medical decision making (see chart for details).

## 2017-11-10 NOTE — Patient Instructions (Addendum)
I am sorry the tegretol did not work out.  Here's our plan:  1. I will start you on trileptal 150 mg strength: Take one pill each night for 1 week, then 1 pills twice daily thereafter. Side effects may include sleepiness, grogginess, balance issue.   2. I will order another special MRI to look the trigeminal nerves.   3. Have Dr. Shelia Media check your HbA1c.   4. We may consider a consultation for gamma knife for trigeminal neuralgia/facial pain.

## 2017-11-10 NOTE — Telephone Encounter (Signed)
Medicare/BCBS supp order sent to [facility]. No auth they will reach out to the pt to schedule.  °

## 2017-11-29 ENCOUNTER — Telehealth: Payer: Self-pay | Admitting: Neurology

## 2017-11-29 DIAGNOSIS — G500 Trigeminal neuralgia: Secondary | ICD-10-CM

## 2017-11-29 NOTE — Telephone Encounter (Signed)
I spoke with Dr. Rexene Alberts. She recommends that pt taper off of the trileptal to 1 tablet qhs if pt is taking it BID, and then stop. Pt should also have a referral to [facility] for the gamma knife consult.   I called pt. She tried the trileptal BID dosing but it making her too sleepy, so she backed down to 1 tablet qhs, but then stopped taking the trileptal altogether yesterday because it was too sedating.   She reports that she has not yet had her MRI. I gave her [facility] phone number. She will call them.   She is also agreeable to a referral to [facility] to discuss the gamma knife procedure and understands that [facility] will call her regarding that appt.

## 2017-11-29 NOTE — Telephone Encounter (Signed)
Pt called stating that she since taking OXcarbazepine (TRILEPTAL) 150 MG tablet she has been having troubles waking up in the morning and has made her pain worse. Please call to advise

## 2017-12-02 DIAGNOSIS — E7800 Pure hypercholesterolemia, unspecified: Secondary | ICD-10-CM | POA: Diagnosis not present

## 2017-12-02 DIAGNOSIS — Z79899 Other long term (current) drug therapy: Secondary | ICD-10-CM | POA: Diagnosis not present

## 2017-12-02 DIAGNOSIS — M8580 Other specified disorders of bone density and structure, unspecified site: Secondary | ICD-10-CM | POA: Diagnosis not present

## 2017-12-02 DIAGNOSIS — I10 Essential (primary) hypertension: Secondary | ICD-10-CM | POA: Diagnosis not present

## 2017-12-02 DIAGNOSIS — E559 Vitamin D deficiency, unspecified: Secondary | ICD-10-CM | POA: Diagnosis not present

## 2017-12-02 DIAGNOSIS — N390 Urinary tract infection, site not specified: Secondary | ICD-10-CM | POA: Diagnosis not present

## 2017-12-04 ENCOUNTER — Ambulatory Visit
Admission: RE | Admit: 2017-12-04 | Discharge: 2017-12-04 | Disposition: A | Discharge status: Home / Self Care | Payer: Medicare Other | Source: Ambulatory Visit | Attending: Neurology | Admitting: Neurology

## 2017-12-04 DIAGNOSIS — R519 Headache, unspecified: Secondary | ICD-10-CM

## 2017-12-04 DIAGNOSIS — G500 Trigeminal neuralgia: Secondary | ICD-10-CM

## 2017-12-04 DIAGNOSIS — K068 Other specified disorders of gingiva and edentulous alveolar ridge: Secondary | ICD-10-CM

## 2017-12-04 DIAGNOSIS — R51 Headache: Secondary | ICD-10-CM

## 2017-12-04 MED ORDER — GADOBENATE DIMEGLUMINE 529 MG/ML IV SOLN
13.0000 mL | Freq: Once | INTRAVENOUS | Status: AC | PRN
  Administered 2017-12-04: 13 mL via INTRAVENOUS

## 2017-12-07 ENCOUNTER — Telehealth: Payer: Self-pay

## 2017-12-07 DIAGNOSIS — Z0000 Encounter for general adult medical examination without abnormal findings: Secondary | ICD-10-CM | POA: Diagnosis not present

## 2017-12-07 DIAGNOSIS — I447 Left bundle-branch block, unspecified: Secondary | ICD-10-CM | POA: Diagnosis not present

## 2017-12-07 DIAGNOSIS — G500 Trigeminal neuralgia: Secondary | ICD-10-CM | POA: Diagnosis not present

## 2017-12-07 DIAGNOSIS — M545 Low back pain: Secondary | ICD-10-CM | POA: Diagnosis not present

## 2017-12-07 DIAGNOSIS — E039 Hypothyroidism, unspecified: Secondary | ICD-10-CM | POA: Diagnosis not present

## 2017-12-07 DIAGNOSIS — I10 Essential (primary) hypertension: Secondary | ICD-10-CM | POA: Diagnosis not present

## 2017-12-07 DIAGNOSIS — E7800 Pure hypercholesterolemia, unspecified: Secondary | ICD-10-CM | POA: Diagnosis not present

## 2017-12-07 DIAGNOSIS — R5383 Other fatigue: Secondary | ICD-10-CM | POA: Diagnosis not present

## 2017-12-07 DIAGNOSIS — M797 Fibromyalgia: Secondary | ICD-10-CM | POA: Diagnosis not present

## 2017-12-07 DIAGNOSIS — M5136 Other intervertebral disc degeneration, lumbar region: Secondary | ICD-10-CM | POA: Diagnosis not present

## 2017-12-07 DIAGNOSIS — M8580 Other specified disorders of bone density and structure, unspecified site: Secondary | ICD-10-CM | POA: Diagnosis not present

## 2017-12-07 DIAGNOSIS — J309 Allergic rhinitis, unspecified: Secondary | ICD-10-CM | POA: Diagnosis not present

## 2017-12-07 NOTE — Telephone Encounter (Signed)
I called pt to discuss her MRI results. No answer, left a message asking her to call me back. 

## 2017-12-07 NOTE — Telephone Encounter (Signed)
Pt has returned the call to RN Kristen, she is asking for a call back °

## 2017-12-07 NOTE — Telephone Encounter (Signed)
I called pt. I explained her MRI results. Pt reports that she saw Dr. Shelia Media today and he prescribed her baclofen to take for her pain. She will continue with the gamma knife consultation at [facility]. Pt verbalized understanding of results. Pt had no questions at this time but was encouraged to call back if questions arise.

## 2017-12-07 NOTE — Telephone Encounter (Signed)
-----   Message from Star Age, MD sent at 12/07/2017 10:08 AM EDT ----- Trigeminal nerves dedicated brain scan looks okay, stable findings from 2018 also. Incidentally, she has a very small growth on the falx, which is the lining of the brain in the midline. This is like a benign growth, called meningioma. Was seen last time but not reported as it is so small and without consequence. Appears stable and there is typically nothing to do about it. Nevertheless, we will seek consultation with [facility] re: gamma knife, referral should be in process, looks like appt on 01/07/18 with Dr. Arlan Organ at [facility]. Please update pt on the latest brain MRI and appt.  Star Age, MD, PhD [facility])

## 2017-12-07 NOTE — Progress Notes (Signed)
Trigeminal nerves dedicated brain scan looks okay, stable findings from 2018 also. Incidentally, she has a very small growth on the falx, which is the lining of the brain in the midline. This is like a benign growth, called meningioma. Was seen last time but not reported as it is so small and without consequence. Appears stable and there is typically nothing to do about it. Nevertheless, we will seek consultation with [facility] re: gamma knife, referral should be in process, looks like appt on 01/07/18 with Dr. Arlan Organ at [facility]. Please update pt on the latest brain MRI and appt.  Star Age, MD, PhD [facility])

## 2017-12-16 ENCOUNTER — Telehealth: Payer: Self-pay | Admitting: *Deleted

## 2017-12-16 DIAGNOSIS — M5416 Radiculopathy, lumbar region: Secondary | ICD-10-CM

## 2017-12-16 NOTE — Telephone Encounter (Signed)
Order place and patient to call and schedule the appt at GI with [name] [phone]

## 2017-12-28 DIAGNOSIS — H903 Sensorineural hearing loss, bilateral: Secondary | ICD-10-CM | POA: Diagnosis not present

## 2017-12-29 ENCOUNTER — Other Ambulatory Visit: Payer: Self-pay | Admitting: Sports Medicine

## 2017-12-29 DIAGNOSIS — M5416 Radiculopathy, lumbar region: Secondary | ICD-10-CM

## 2018-01-06 ENCOUNTER — Ambulatory Visit
Admission: RE | Admit: 2018-01-06 | Discharge: 2018-01-06 | Disposition: A | Discharge status: Home / Self Care | Payer: Medicare Other | Source: Ambulatory Visit | Attending: Sports Medicine | Admitting: Sports Medicine

## 2018-01-06 DIAGNOSIS — M5416 Radiculopathy, lumbar region: Secondary | ICD-10-CM

## 2018-01-06 DIAGNOSIS — M545 Low back pain: Secondary | ICD-10-CM | POA: Diagnosis not present

## 2018-01-06 MED ORDER — METHYLPREDNISOLONE ACETATE 40 MG/ML INJ SUSP (RADIOLOG
120.0000 mg | Freq: Once | INTRAMUSCULAR | Status: AC
  Administered 2018-01-06: 120 mg via EPIDURAL

## 2018-01-06 MED ORDER — IOPAMIDOL (ISOVUE-M 200) INJECTION 41%
1.0000 mL | Freq: Once | INTRAMUSCULAR | Status: AC
  Administered 2018-01-06: 1 mL via EPIDURAL

## 2018-01-06 NOTE — Discharge Instructions (Signed)

## 2018-01-07 DIAGNOSIS — G500 Trigeminal neuralgia: Secondary | ICD-10-CM | POA: Diagnosis not present

## 2018-01-07 DIAGNOSIS — G501 Atypical facial pain: Secondary | ICD-10-CM | POA: Diagnosis not present

## 2018-01-18 ENCOUNTER — Other Ambulatory Visit: Payer: Self-pay | Admitting: Family Medicine

## 2018-01-18 ENCOUNTER — Telehealth: Payer: Self-pay | Admitting: Sports Medicine

## 2018-01-18 NOTE — Telephone Encounter (Signed)
Needs an order put in for another injection into her lower back, to [facility] if possible. If fyou have questions, she may be reached at [phone]

## 2018-01-19 ENCOUNTER — Encounter: Payer: Self-pay | Admitting: Family Medicine

## 2018-01-19 ENCOUNTER — Ambulatory Visit (INDEPENDENT_AMBULATORY_CARE_PROVIDER_SITE_OTHER): Payer: Medicare Other | Admitting: Family Medicine

## 2018-01-19 ENCOUNTER — Ambulatory Visit
Admission: RE | Admit: 2018-01-19 | Discharge: 2018-01-19 | Disposition: A | Discharge status: Home / Self Care | Payer: Medicare Other | Source: Ambulatory Visit | Attending: Family Medicine | Admitting: Family Medicine

## 2018-01-19 VITALS — BP 154/71 | Ht 63.0 in | Wt 141.0 lb

## 2018-01-19 DIAGNOSIS — M5416 Radiculopathy, lumbar region: Secondary | ICD-10-CM

## 2018-01-19 DIAGNOSIS — M5136 Other intervertebral disc degeneration, lumbar region: Secondary | ICD-10-CM | POA: Diagnosis not present

## 2018-01-19 NOTE — Patient Instructions (Signed)
Get x-rays of your lumbar spine today. Assuming these are normal we will send in an order for the injection on the right side of your back.

## 2018-01-20 ENCOUNTER — Encounter: Payer: Self-pay | Admitting: Family Medicine

## 2018-01-20 ENCOUNTER — Other Ambulatory Visit: Payer: Self-pay | Admitting: Family Medicine

## 2018-01-20 DIAGNOSIS — M5416 Radiculopathy, lumbar region: Secondary | ICD-10-CM

## 2018-01-20 HISTORY — PX: OTHER SURGICAL HISTORY: SHX169

## 2018-01-20 NOTE — Progress Notes (Signed)
PCP: Deland Pretty, MD  Subjective:   HPI: Patient is a 77 y.o. female here for low back pain.  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 She typically does well with lumbar epidural steroid injections and selective nerve root blocks. She states current episode seemed to get worse back in March following an MRI when she had to lay down leading to more soreness on the right side of her low back. She contacted [place] and had an epidural steroid injection on July 18 with a right L5 nerve root block. She states the injection helped her quite a bit but on Friday she was in the kitchen and turned and felt a sharp knifelike pain on her right side leading to difficulty with her bending over She noted radiation into the right groin area.   She states pain is a 4 out of 10 level and dull but can get up to 7 out of 10 and sharp with movement. She has tried Norco and baclofen with minimal improvement.   No bowel or bladder dysfunction. No numbness  Past Medical History:  Diagnosis Date  . Allergy   . Arthritis   . Chronic headaches   . Fibromyalgia   . Gallstones   . Hyperlipemia   . Hypertension   . Interstitial cystitis   . Lactose intolerance   . LBBB (left bundle branch block)   . Osteoarthritis   . Pneumonia   . Tinnitus     Current Outpatient Medications on File Prior to Visit  Medication Sig Dispense Refill  . baclofen (LIORESAL) 10 MG tablet TAKE 1 2 (ONE HALF) TABLET BY MOUTH WITH FOOD OR MILK FOR 10 DAYS AND THEN TAKE 1 (ONE) TABLET BY MOUTH THREE TIMES DAILY  3  . cetirizine (ZYRTEC) 10 MG tablet Take 10 mg by mouth daily.    . Cholecalciferol (VITAMIN D-3) 1000 UNITS CAPS Take 1 capsule by mouth daily.    Marland Kitchen dicyclomine (BENTYL) 20 MG tablet Take 1 tablet (20 mg total) by mouth 2 (two) times daily as needed for spasms. 20 tablet 0  . DULoxetine (CYMBALTA) 30 MG capsule Take 30 mg by mouth 2 (two) times daily.     Marland Kitchen lactase  (LACTAID) 3000 units tablet Take by mouth.    . Lactobacillus (MORE-DOPHILUS ACIDOPHILUS PO) Take by mouth.    . levothyroxine (SYNTHROID, LEVOTHROID) 50 MCG tablet Take 50 mcg by mouth every morning.    . OXcarbazepine (TRILEPTAL) 150 MG tablet Take 1 tablet (150 mg total) by mouth 2 (two) times daily. One pill at bedtime for one week, then 1 pill twice daily 60 tablet 5  . predniSONE 2 MG TBEC Take 2 mg by mouth daily.    . RaNITidine HCl (ZANTAC 75 PO) Take by mouth.    Derrill Memo ON 01/21/2018] ropivacaine, PF, 5 mg/mL, 0.5%, (NAROPIN) 5 MG/ML injection Inject into the skin.    Marland Kitchen triamcinolone (NASACORT) 55 MCG/ACT AERO nasal inhaler USE 2 SPRAY(S) IN EACH NOSTRIL ONCE DAILY  11   No current facility-administered medications on file prior to visit.     Past Surgical History:  Procedure Laterality Date  . CHOLECYSTECTOMY  03/2010  . COLONOSCOPY  2009   Dr. Raford Pitcher   . CYSTOSCOPY     Multiple Cystoscopies with stents or biopsy   . TONSILLECTOMY      Allergies  Allergen Reactions  . Persantine [Dipyridamole] Other (See Comments)    Migraines   . Levothyroxine   .  Butrans [Buprenorphine] Other (See Comments)    "knocks me out"  . Codeine Nausea Only  . Doxycycline Rash  . Esomeprazole Other (See Comments)    Jittery, sore throat  . Irbesartan Diarrhea  . Lactose Intolerance (Gi) Other (See Comments)    GI upset   . Lactulose Other (See Comments)    GI upset  . Losartan Other (See Comments)    Hot and chills  . Maxzide [Hydrochlorothiazide W-Triamterene] Other (See Comments)    Cramps   . Micardis [Telmisartan] Other (See Comments)    headache  . Nortriptyline Hcl Other (See Comments)    "My body doesn't metabolize it anymore."  . Oxycodone Other (See Comments)    Interstitial cystitis  . Oxycontin [Oxycodone Hcl] Other (See Comments)    Interstitial cystitis   . Pennsaid [Diclofenac Sodium] Hives  . Tramadol Itching    sedation    Social History    Socioeconomic History  . Marital status: Married    Spouse name: Not on file  . Number of children: 2  . Years of education: college  . Highest education level: Not on file  Occupational History  . Occupation: housewife    Employer: RETIRED  Social Needs  . Financial resource strain: Not on file  . Food insecurity:    Worry: Not on file    Inability: Not on file  . Transportation needs:    Medical: Not on file    Non-medical: Not on file  Tobacco Use  . Smoking status: Never Smoker  . Smokeless tobacco: Never Used  Substance and Sexual Activity  . Alcohol use: No    Alcohol/week: 0.0 oz  . Drug use: No  . Sexual activity: Not on file  Lifestyle  . Physical activity:    Days per week: Not on file    Minutes per session: Not on file  . Stress: Not on file  Relationships  . Social connections:    Talks on phone: Not on file    Gets together: Not on file    Attends religious service: Not on file    Active member of club or organization: Not on file    Attends meetings of clubs or organizations: Not on file    Relationship status: Not on file  . Intimate partner violence:    Fear of current or ex partner: Not on file    Emotionally abused: Not on file    Physically abused: Not on file    Forced sexual activity: Not on file  Other Topics Concern  . Not on file  Social History Narrative   1 cup of Jasmine tea daily     Family History  Problem Relation Age of Onset  . Heart disease Mother   . Heart disease Father   . Stroke Father   . Stroke Sister     BP (!) 154/71   Ht 5\' 3"  (1.6 m)   Wt 141 lb (64 kg)   BMI 24.98 kg/m   Review of Systems: See HPI above.     Objective:  Physical Exam:  Gen: NAD, comfortable in exam room  Back: No gross deformity, scoliosis. TTP minimally right lumbar paraspinal region.  Mild midline tenderness about the L4, L5 levels. Full extension, lateral rotations.  Flexion only to 30 degrees, painful. Strength LEs 5/5 all  muscle groups.   2+ MSRs in patellar and achilles tendons, equal bilaterally. Negative SLRs. Sensation intact to light touch bilaterally.  Bilateral hips: No deformity. FROM  with 5/5 strength. No tenderness to palpation. NVI distally. Negative logroll bilateral hips Negative fabers and piriformis stretches.   Assessment & Plan:  1. Low back pain -advised we go ahead with repeat lumbar spine radiographs given she does have some midline tenderness.  Independently reviewed these radiographs and no evidence of compression fracture or new bony abnormalities.  We discussed that her current issue may be higher than her previous L4-5 spondylosis given this is radiating into the more proximal right leg and groin.  Regardless she is done well with epidural steroid injections in the past and believe she would benefit with an ESI on the right side even if it is a couple levels below her pathology.  We will set her up for this but advised her that if this does not provide her benefit would recommend going ahead with a repeat MRI of the lumbar spine.  Tylenol if needed.

## 2018-01-21 DIAGNOSIS — Z510 Encounter for antineoplastic radiation therapy: Secondary | ICD-10-CM | POA: Diagnosis not present

## 2018-01-21 DIAGNOSIS — G500 Trigeminal neuralgia: Secondary | ICD-10-CM | POA: Diagnosis not present

## 2018-01-26 ENCOUNTER — Ambulatory Visit (INDEPENDENT_AMBULATORY_CARE_PROVIDER_SITE_OTHER): Payer: Medicare Other | Admitting: Internal Medicine

## 2018-01-26 ENCOUNTER — Encounter: Payer: Self-pay | Admitting: Internal Medicine

## 2018-01-26 VITALS — BP 132/68 | HR 68 | Ht 63.0 in | Wt 143.8 lb

## 2018-01-26 DIAGNOSIS — R195 Other fecal abnormalities: Secondary | ICD-10-CM | POA: Diagnosis not present

## 2018-01-26 DIAGNOSIS — G500 Trigeminal neuralgia: Secondary | ICD-10-CM | POA: Diagnosis not present

## 2018-01-26 MED ORDER — SOD PICOSULFATE-MAG OX-CIT ACD 10-3.5-12 MG-GM -GM/160ML PO SOLN: 1.0000 | Freq: Once | ORAL | 0 refills | Status: AC

## 2018-01-26 NOTE — Patient Instructions (Signed)
You have been scheduled for a colonoscopy. Please follow written instructions given to you at your visit today.  Please pick up your prep supplies at the pharmacy within the next 1-3 days. If you use inhalers (even only as needed), please bring them with you on the day of your procedure. Your physician has requested that you go to www.startemmi.com and enter the access code given to you at your visit today. This web site gives a general overview about your procedure. However, you should still follow specific instructions given to you by our office regarding your preparation for the procedure.   Take one tablespoon of Benefiber daily, handout provided.   I appreciate the opportunity to care for you. Silvano Rusk, MD, [facility]

## 2018-01-26 NOTE — Progress Notes (Signed)
[name] 77 y.o. September 30, 1940 [id number] Referred by Dr. Dory Horn Assessment & Plan:   Encounter Diagnoses  Name Primary?  . Positive colorectal cancer screening using Cologuard test Yes  . Hard stool     She needs a colonoscopy.  Clenpiq prep will be used which is a low volume prep and hopefully she will tolerate that.  The risks and benefits as well as alternatives of endoscopic procedure(s) have been discussed and reviewed. All questions answered. The patient agrees to proceed.   Try Benefiber 1 tablespoon nightly for her hard stools.   I appreciate the opportunity to care for this patient. CC: Deland Pretty, MD Evette Cristal, MD  Subjective:   Chief Complaint: Positive Cologuard and hard stools  HPI The patient presents complaining of hard stools that she came off her hormone therapy, she moves her bowels regularly but the stools are a bit hard.  Dr. Cristie Hem suggested she try MiraLAX which soften her stools but cause a fair amount of bloating.l no bleeding or anything like that.  Lately she is been slightly nauseous and having a little bit of looser or more frequent stools which she says could be related to a gamma knife treatment for trigeminal neuralgia that she had last week.  She had a colonoscopy in 2009 by Dr. Earlean Shawl and that was negative, but she had a terrible time with the prep with nausea and vomiting using a MiraLAX Gatorade prep and had to have an enema and felt like that the staff criticized her for not finishing her prep when she arrived and she does not want to repeat that experience.  She did a Cologuard and it came back positive.  She is not having any obvious bleeding or changes in bowel habits other than that described above. Allergies  Allergen Reactions  . Persantine [Dipyridamole] Other (See Comments)    Migraines   . Levothyroxine   . Butrans [Buprenorphine] Other (See Comments)    "knocks me out"  . Codeine Nausea Only  . Doxycycline Rash  .  Esomeprazole Other (See Comments)    Jittery, sore throat  . Irbesartan Diarrhea  . Lactose Intolerance (Gi) Other (See Comments)    GI upset   . Lactulose Other (See Comments)    GI upset  . Losartan Other (See Comments)    Hot and chills  . Maxzide [Hydrochlorothiazide W-Triamterene] Other (See Comments)    Cramps   . Micardis [Telmisartan] Other (See Comments)    headache  . Nortriptyline Hcl Other (See Comments)    "My body doesn't metabolize it anymore."  . Oxycodone Other (See Comments)    Interstitial cystitis  . Oxycontin [Oxycodone Hcl] Other (See Comments)    Interstitial cystitis   . Pennsaid [Diclofenac Sodium] Hives  . Tramadol Itching    sedation   Current Meds  Medication Sig  . baclofen (LIORESAL) 10 MG tablet TAKE 1 2 (ONE HALF) TABLET BY MOUTH WITH FOOD OR MILK FOR 10 DAYS AND THEN TAKE 1 (ONE) TABLET BY MOUTH THREE TIMES DAILY  . cetirizine (ZYRTEC) 10 MG tablet Take 10 mg by mouth daily.  . Cholecalciferol (VITAMIN D-3) 5000 units TABS Take 1 tablet by mouth daily.  . Cyanocobalamin (VITAMIN B-12) 2500 MCG SUBL Place 1 tablet under the tongue daily.  . Digestive Enzymes (DIGESTIVE ENZYME ULTRA) CAPS Take 3 capsules by mouth daily.  . DULoxetine (CYMBALTA) 30 MG capsule Take 30 mg by mouth 2 (two) times daily.   Marland Kitchen HYDROcodone-acetaminophen (NORCO/VICODIN) 5-325  MG tablet Take 1 tablet by mouth every 6 (six) hours as needed for moderate pain.  . Lactobacillus (MORE-DOPHILUS ACIDOPHILUS PO) Take by mouth.  . levothyroxine (SYNTHROID, LEVOTHROID) 50 MCG tablet Take 50 mcg by mouth every morning.  . Magnesium 300 MG CAPS Take 2 capsules by mouth daily.  . predniSONE 2 MG TBEC Take 1 mg by mouth every other day.   . RaNITidine HCl (ZANTAC 75 PO) Take by mouth.  . ropivacaine, PF, 5 mg/mL, 0.5%, (NAROPIN) 5 MG/ML injection Inject into the skin.  Marland Kitchen triamcinolone (NASACORT) 55 MCG/ACT AERO nasal inhaler USE 2 SPRAY(S) IN EACH NOSTRIL ONCE DAILY   Past Medical  History:  Diagnosis Date  . Allergy   . Arthritis   . Chronic headaches   . Fibromyalgia   . Gallstones   . Hyperlipemia   . Hypertension   . Interstitial cystitis   . Lactose intolerance   . LBBB (left bundle branch block)   . Osteoarthritis   . Pneumonia   . Tinnitus    Past Surgical History:  Procedure Laterality Date  . CHOLECYSTECTOMY  03/2010  . COLONOSCOPY  2009   Dr. Raford Pitcher   . CYSTOSCOPY     Multiple Cystoscopies with stents or biopsy   . TONSILLECTOMY     Social History   Social History Narrative   1 cup of Jasmine tea daily    family history includes Heart disease in her father and mother; Stroke in her father and sister.   Review of Systems Recent Gamma knife for trigeminal neuralgia has to wait a few months to see the final results some days are better some days are worse at this point Sciatica is flaring and is going to get a steroid epidural injection Mild fatigue.  Objective:   Physical Exam @BP  132/68   Pulse 68   Ht 5\' 3"  (1.6 m)   Wt 143 lb 12.8 oz (65.2 kg)   BMI 25.47 kg/m @  General:  Well-developed, well-nourished and in no acute distress Eyes:  anicteric.  Lungs: Clear to auscultation bilaterally. Heart:  S1S2, no rubs, murmurs, gallops. Abdomen:  soft, non-tender, no hepatosplenomegaly, hernia, or mass and BS+.  Rectal: Is deferred until colonoscopy  Neuro:  A&O x 3.  Psych:  appropriate mood and  Affect.   Data Reviewed:   See above hemoglobin 14 on June 13 She also brought a copy of her [facility] report she is a poor metabolizer of cytochrome P4 5385 and a rapid actually ultra-rapid metabolizer of the 2 D6.  I do not think this has any relevance to her colonoscopy procedure she wants to be sure.

## 2018-01-27 ENCOUNTER — Ambulatory Visit
Admission: RE | Admit: 2018-01-27 | Discharge: 2018-01-27 | Disposition: A | Discharge status: Home / Self Care | Payer: Medicare Other | Source: Ambulatory Visit | Attending: Family Medicine | Admitting: Family Medicine

## 2018-01-27 DIAGNOSIS — M47817 Spondylosis without myelopathy or radiculopathy, lumbosacral region: Secondary | ICD-10-CM | POA: Diagnosis not present

## 2018-01-27 DIAGNOSIS — M5416 Radiculopathy, lumbar region: Secondary | ICD-10-CM

## 2018-01-27 MED ORDER — METHYLPREDNISOLONE ACETATE 40 MG/ML INJ SUSP (RADIOLOG
120.0000 mg | Freq: Once | INTRAMUSCULAR | Status: AC
  Administered 2018-01-27: 120 mg via EPIDURAL

## 2018-01-27 MED ORDER — IOPAMIDOL (ISOVUE-M 200) INJECTION 41%
1.0000 mL | Freq: Once | INTRAMUSCULAR | Status: AC
  Administered 2018-01-27: 1 mL via EPIDURAL

## 2018-02-22 ENCOUNTER — Telehealth: Payer: Self-pay | Admitting: Internal Medicine

## 2018-02-22 NOTE — Telephone Encounter (Signed)
Offered patient alternative preps such as Plenvu or Suprep that are lower volume but informed patient I was still unsure if it was covered by her insurance. Then I offered patient a free prep kit of Plenvu that she can pick up at our front desk. Patient states she would prefer that then paying $160 for the Clenpiq. New instructions printed and put with pre kit for patient at front desk.

## 2018-02-24 DIAGNOSIS — E039 Hypothyroidism, unspecified: Secondary | ICD-10-CM | POA: Diagnosis not present

## 2018-03-07 DIAGNOSIS — E039 Hypothyroidism, unspecified: Secondary | ICD-10-CM | POA: Diagnosis not present

## 2018-03-09 DIAGNOSIS — R5383 Other fatigue: Secondary | ICD-10-CM | POA: Diagnosis not present

## 2018-03-09 DIAGNOSIS — R252 Cramp and spasm: Secondary | ICD-10-CM | POA: Diagnosis not present

## 2018-03-09 DIAGNOSIS — I10 Essential (primary) hypertension: Secondary | ICD-10-CM | POA: Diagnosis not present

## 2018-03-09 DIAGNOSIS — R234 Changes in skin texture: Secondary | ICD-10-CM | POA: Diagnosis not present

## 2018-03-09 DIAGNOSIS — M5441 Lumbago with sciatica, right side: Secondary | ICD-10-CM | POA: Diagnosis not present

## 2018-03-12 DIAGNOSIS — R0789 Other chest pain: Secondary | ICD-10-CM | POA: Diagnosis not present

## 2018-03-17 DIAGNOSIS — Z23 Encounter for immunization: Secondary | ICD-10-CM | POA: Diagnosis not present

## 2018-03-17 DIAGNOSIS — M797 Fibromyalgia: Secondary | ICD-10-CM | POA: Diagnosis not present

## 2018-03-17 DIAGNOSIS — M898X1 Other specified disorders of bone, shoulder: Secondary | ICD-10-CM | POA: Diagnosis not present

## 2018-03-24 ENCOUNTER — Ambulatory Visit (AMBULATORY_SURGERY_CENTER): Payer: Medicare Other | Admitting: Internal Medicine

## 2018-03-24 ENCOUNTER — Encounter: Payer: Self-pay | Admitting: Internal Medicine

## 2018-03-24 VITALS — BP 129/68 | HR 64 | Temp 98.0°F | Resp 17 | Ht 63.0 in | Wt 143.0 lb

## 2018-03-24 DIAGNOSIS — R195 Other fecal abnormalities: Secondary | ICD-10-CM

## 2018-03-24 DIAGNOSIS — D125 Benign neoplasm of sigmoid colon: Secondary | ICD-10-CM | POA: Diagnosis not present

## 2018-03-24 DIAGNOSIS — D126 Benign neoplasm of colon, unspecified: Secondary | ICD-10-CM

## 2018-03-24 DIAGNOSIS — D123 Benign neoplasm of transverse colon: Secondary | ICD-10-CM

## 2018-03-24 HISTORY — DX: Benign neoplasm of colon, unspecified: D12.6

## 2018-03-24 MED ORDER — SODIUM CHLORIDE 0.9 % IV SOLN: 500.0000 mL | Freq: Once | INTRAVENOUS | Status: DC

## 2018-03-24 NOTE — Progress Notes (Signed)
Called to room to assist during endoscopic procedure.  Patient ID and intended procedure confirmed with present staff. Received instructions for my participation in the procedure from the performing physician.  

## 2018-03-24 NOTE — Patient Instructions (Addendum)
I found and removed 3 small polyps. I also saw internal hemorrhoids which could have also made Cologuard + I am not going to recommend repeating colonoscopy or Cologuard.   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 Gatha Mayer, MD, FACG   YOU HAD AN ENDOSCOPIC PROCEDURE TODAY AT [facility]:   Refer to the procedure report that was given to you for any specific questions about what was found during the examination.  If the procedure report does not answer your questions, please call your gastroenterologist to clarify.  If you requested that your care partner not be given the details of your procedure findings, then the procedure report has been included in a sealed envelope for you to review at your convenience later.  YOU SHOULD EXPECT: Some feelings of bloating in the abdomen. Passage of more gas than usual.  Walking can help get rid of the air that was put into your GI tract during the procedure and reduce the bloating. If you had a lower endoscopy (such as a colonoscopy or flexible sigmoidoscopy) you may notice spotting of blood in your stool or on the toilet paper. If you underwent a bowel prep for your procedure, you may not have a normal bowel movement for a few days.  Please Note:  You might notice some irritation and congestion in your nose or some drainage.  This is from the oxygen used during your procedure.  There is no need for concern and it should clear up in a day or so.  SYMPTOMS TO REPORT IMMEDIATELY:   Following lower endoscopy (colonoscopy or flexible sigmoidoscopy):  Excessive amounts of blood in the stool  Significant tenderness or worsening of abdominal pains  Swelling of the abdomen that is new, acute  Fever of 100F or higher  For urgent or emergent issues, a gastroenterologist can be reached at any hour by calling  [phone].   DIET:  We do recommend a small meal at first, but then you may proceed to your regular diet.  Drink plenty of fluids but you should avoid alcoholic beverages for 24 hours.  ACTIVITY:  You should plan to take it easy for the rest of today and you should NOT DRIVE or use heavy machinery until tomorrow (because of the sedation medicines used during the test).    FOLLOW UP: Our staff will call the number listed on your records the next business day following your procedure to check on you and address any questions or concerns that you may have regarding the information given to you following your procedure. If we do not reach you, we will leave a message.  However, if you are feeling well and you are not experiencing any problems, there is no need to return our call.  We will assume that you have returned to your regular daily activities without incident.  If any biopsies were taken you will be contacted by phone or by letter within the next 1-3 weeks.  Please call us at [phone] if you have not heard about the biopsies in 3 weeks.    SIGNATURES/CONFIDENTIALITY: You and/or your care partner have signed paperwork which will be entered into your electronic medical record.  These signatures attest to the fact that that the information above on your After Visit Summary has been reviewed and is  understood.  Full responsibility of the confidentiality of this discharge information lies with you and/or your care-partner. 

## 2018-03-24 NOTE — Op Note (Signed)
[place] Patient Name: [name] Procedure Date: 03/24/2018 1:33 PM MRN: [id number] Endoscopist: Gatha Mayer , MD Age: 77 Referring MD:  Date of Birth: 07-01-1940 Gender: Female Account #: [id number] Procedure:                Colonoscopy Indications:              Positive Cologuard test Medicines:                Propofol per Anesthesia, Monitored Anesthesia Care Procedure:                Pre-Anesthesia Assessment:                           - Prior to the procedure, a History and Physical                            was performed, and patient medications and                            allergies were reviewed. The patient's tolerance of                            previous anesthesia was also reviewed. The risks                            and benefits of the procedure and the sedation                            options and risks were discussed with the patient.                            All questions were answered, and informed consent                            was obtained. Prior Anticoagulants: The patient has                            taken no previous anticoagulant or antiplatelet                            agents. ASA Grade Assessment: II - A patient with                            mild systemic disease. After reviewing the risks                            and benefits, the patient was deemed in                            satisfactory condition to undergo the procedure.                           After obtaining informed consent, the colonoscope  was passed under direct vision. Throughout the                            procedure, the patient's blood pressure, pulse, and                            oxygen saturations were monitored continuously. The                            Colonoscope was introduced through the anus and                            advanced to the the cecum, identified by                            appendiceal orifice  and ileocecal valve. The                            colonoscopy was performed without difficulty. The                            patient tolerated the procedure well. The quality                            of the bowel preparation was excellent. The                            ileocecal valve, appendiceal orifice, and rectum                            were photographed. Scope In: 1:47:31 PM Scope Out: 2:03:23 PM Scope Withdrawal Time: 0 hours 13 minutes 0 seconds  Total Procedure Duration: 0 hours 15 minutes 52 seconds  Findings:                 The perianal and digital rectal examinations were                            normal.                           A 5 mm polyp was found in the sigmoid colon. The                            polyp was sessile. The polyp was removed with a                            cold snare. Resection and retrieval were complete.                            Verification of patient identification for the                            specimen was done. Estimated blood loss was minimal.  Two flat polyps were found in the sigmoid colon and                            transverse colon. The polyps were diminutive in                            size. These polyps were removed with a cold biopsy                            forceps. Resection and retrieval were complete.                            Verification of patient identification for the                            specimen was done. Estimated blood loss was minimal.                           Internal hemorrhoids were found.                           Diverticula were found in the sigmoid colon.                           The exam was otherwise without abnormality on                            direct and retroflexion views. Complications:            No immediate complications. Estimated Blood Loss:     Estimated blood loss was minimal. Impression:               - One 5 mm polyp in the sigmoid colon,  removed with                            a cold snare. Resected and retrieved.                           - Two diminutive polyps in the sigmoid colon and in                            the transverse colon, removed with a cold biopsy                            forceps. Resected and retrieved.                           - Internal hemorrhoids.                           - Diverticulosis in the sigmoid colon.                           - The examination was otherwise normal on direct  and retroflexion views. Recommendation:           - Patient has a contact number available for                            emergencies. The signs and symptoms of potential                            delayed complications were discussed with the                            patient. Return to normal activities tomorrow.                            Written discharge instructions were provided to the                            patient.                           - Resume previous diet.                           - Continue present medications.                           - No repeat colonoscopy due to age. Gatha Mayer, MD 03/24/2018 2:16:58 PM This report has been signed electronically.

## 2018-03-24 NOTE — Progress Notes (Signed)
Alert and oriented x 3, pleased with MAC, report to RN

## 2018-03-25 ENCOUNTER — Telehealth: Payer: Self-pay

## 2018-03-25 NOTE — Telephone Encounter (Signed)
  Follow up Call-  Call back number 03/24/2018  Post procedure Call Back phone  # [phone]  Permission to leave phone message Yes  Some recent data might be hidden     Patient questions:  Do you have a fever, pain , or abdominal swelling? No. Pain Score  0 *  Have you tolerated food without any problems? Yes.    Have you been able to return to your normal activities? Yes.    Do you have any questions about your discharge instructions: Diet   No. Medications  No. Follow up visit  No.  Do you have questions or concerns about your Care? No.  Actions: * If pain score is 4 or above: No action needed, pain <4.

## 2018-04-08 ENCOUNTER — Encounter: Payer: Self-pay | Admitting: Internal Medicine

## 2018-04-08 NOTE — Progress Notes (Signed)
One adenoma No recall - age My Chart

## 2018-04-13 ENCOUNTER — Ambulatory Visit: Payer: Self-pay | Admitting: Adult Health

## 2018-04-18 DIAGNOSIS — M25562 Pain in left knee: Secondary | ICD-10-CM | POA: Diagnosis not present

## 2018-04-18 DIAGNOSIS — M150 Primary generalized (osteo)arthritis: Secondary | ICD-10-CM | POA: Diagnosis not present

## 2018-04-18 DIAGNOSIS — M25559 Pain in unspecified hip: Secondary | ICD-10-CM | POA: Diagnosis not present

## 2018-04-18 DIAGNOSIS — I10 Essential (primary) hypertension: Secondary | ICD-10-CM | POA: Diagnosis not present

## 2018-04-18 DIAGNOSIS — M797 Fibromyalgia: Secondary | ICD-10-CM | POA: Diagnosis not present

## 2018-04-18 DIAGNOSIS — M170 Bilateral primary osteoarthritis of knee: Secondary | ICD-10-CM | POA: Diagnosis not present

## 2018-04-18 DIAGNOSIS — R5383 Other fatigue: Secondary | ICD-10-CM | POA: Diagnosis not present

## 2018-04-21 ENCOUNTER — Other Ambulatory Visit (HOSPITAL_COMMUNITY): Payer: Self-pay | Admitting: Respiratory Therapy

## 2018-04-21 DIAGNOSIS — R0602 Shortness of breath: Secondary | ICD-10-CM | POA: Diagnosis not present

## 2018-04-21 DIAGNOSIS — M797 Fibromyalgia: Secondary | ICD-10-CM | POA: Diagnosis not present

## 2018-04-21 DIAGNOSIS — J309 Allergic rhinitis, unspecified: Secondary | ICD-10-CM | POA: Diagnosis not present

## 2018-04-21 DIAGNOSIS — K219 Gastro-esophageal reflux disease without esophagitis: Secondary | ICD-10-CM | POA: Diagnosis not present

## 2018-05-03 DIAGNOSIS — K219 Gastro-esophageal reflux disease without esophagitis: Secondary | ICD-10-CM | POA: Diagnosis not present

## 2018-05-03 DIAGNOSIS — J302 Other seasonal allergic rhinitis: Secondary | ICD-10-CM | POA: Diagnosis not present

## 2018-05-03 DIAGNOSIS — J029 Acute pharyngitis, unspecified: Secondary | ICD-10-CM | POA: Diagnosis not present

## 2018-05-04 ENCOUNTER — Ambulatory Visit (HOSPITAL_COMMUNITY)
Admission: RE | Admit: 2018-05-04 | Discharge: 2018-05-04 | Disposition: A | Discharge status: Home / Self Care | Payer: Medicare Other | Source: Ambulatory Visit | Attending: Internal Medicine | Admitting: Internal Medicine

## 2018-05-04 DIAGNOSIS — R0602 Shortness of breath: Secondary | ICD-10-CM

## 2018-05-04 LAB — PULMONARY FUNCTION TEST
DL/VA % pred: 67 %
DL/VA: 3.14 ml/min/mmHg/L
DLCO unc % pred: 57 %
DLCO unc: 13.07 ml/min/mmHg
FEF 25-75 Post: 2.01 L/sec
FEF 25-75 Pre: 2 L/sec
FEF2575-%Change-Post: 0 %
FEF2575-%Pred-Post: 138 %
FEF2575-%Pred-Pre: 137 %
FEV1-%Change-Post: 0 %
FEV1-%Pred-Post: 116 %
FEV1-%Pred-Pre: 116 %
FEV1-Post: 2.23 L
FEV1-Pre: 2.24 L
FEV1FVC-%Change-Post: 4 %
FEV1FVC-%Pred-Pre: 105 %
FEV6-%Change-Post: -2 %
FEV6-%Pred-Post: 111 %
FEV6-%Pred-Pre: 114 %
FEV6-Post: 2.72 L
FEV6-Pre: 2.8 L
FEV6FVC-%Change-Post: 2 %
FEV6FVC-%Pred-Post: 105 %
FEV6FVC-%Pred-Pre: 103 %
FVC-%Change-Post: -4 %
FVC-%Pred-Post: 105 %
FVC-%Pred-Pre: 110 %
FVC-Post: 2.72 L
FVC-Pre: 2.85 L
Post FEV1/FVC ratio: 82 %
Post FEV6/FVC ratio: 100 %
Pre FEV1/FVC ratio: 78 %
Pre FEV6/FVC Ratio: 98 %
RV % pred: 76 %
RV: 1.76 L
TLC % pred: 92 %
TLC: 4.52 L

## 2018-05-04 MED ORDER — ALBUTEROL SULFATE (2.5 MG/3ML) 0.083% IN NEBU
2.5000 mg | INHALATION_SOLUTION | Freq: Once | RESPIRATORY_TRACT | Status: AC
  Administered 2018-05-04: 2.5 mg via RESPIRATORY_TRACT

## 2018-05-05 DIAGNOSIS — E039 Hypothyroidism, unspecified: Secondary | ICD-10-CM | POA: Diagnosis not present

## 2018-05-18 DIAGNOSIS — J309 Allergic rhinitis, unspecified: Secondary | ICD-10-CM | POA: Diagnosis not present

## 2018-05-18 DIAGNOSIS — I10 Essential (primary) hypertension: Secondary | ICD-10-CM | POA: Diagnosis not present

## 2018-05-18 DIAGNOSIS — J0100 Acute maxillary sinusitis, unspecified: Secondary | ICD-10-CM | POA: Diagnosis not present

## 2018-05-23 DIAGNOSIS — R1013 Epigastric pain: Secondary | ICD-10-CM | POA: Diagnosis not present

## 2018-05-23 DIAGNOSIS — K219 Gastro-esophageal reflux disease without esophagitis: Secondary | ICD-10-CM | POA: Diagnosis not present

## 2018-05-23 DIAGNOSIS — M797 Fibromyalgia: Secondary | ICD-10-CM | POA: Diagnosis not present

## 2018-05-23 DIAGNOSIS — R5382 Chronic fatigue, unspecified: Secondary | ICD-10-CM | POA: Diagnosis not present

## 2018-05-26 DIAGNOSIS — H25813 Combined forms of age-related cataract, bilateral: Secondary | ICD-10-CM | POA: Diagnosis not present

## 2018-05-31 DIAGNOSIS — D224 Melanocytic nevi of scalp and neck: Secondary | ICD-10-CM | POA: Diagnosis not present

## 2018-05-31 DIAGNOSIS — R5382 Chronic fatigue, unspecified: Secondary | ICD-10-CM | POA: Diagnosis not present

## 2018-05-31 DIAGNOSIS — E039 Hypothyroidism, unspecified: Secondary | ICD-10-CM | POA: Diagnosis not present

## 2018-05-31 DIAGNOSIS — L821 Other seborrheic keratosis: Secondary | ICD-10-CM | POA: Diagnosis not present

## 2018-05-31 DIAGNOSIS — Z923 Personal history of irradiation: Secondary | ICD-10-CM | POA: Diagnosis not present

## 2018-05-31 DIAGNOSIS — L720 Epidermal cyst: Secondary | ICD-10-CM | POA: Diagnosis not present

## 2018-05-31 DIAGNOSIS — R51 Headache: Secondary | ICD-10-CM | POA: Diagnosis not present

## 2018-05-31 DIAGNOSIS — Z8669 Personal history of other diseases of the nervous system and sense organs: Secondary | ICD-10-CM | POA: Diagnosis not present

## 2018-05-31 DIAGNOSIS — G500 Trigeminal neuralgia: Secondary | ICD-10-CM | POA: Diagnosis not present

## 2018-05-31 DIAGNOSIS — D225 Melanocytic nevi of trunk: Secondary | ICD-10-CM | POA: Diagnosis not present

## 2018-05-31 DIAGNOSIS — D692 Other nonthrombocytopenic purpura: Secondary | ICD-10-CM | POA: Diagnosis not present

## 2018-06-02 DIAGNOSIS — R942 Abnormal results of pulmonary function studies: Secondary | ICD-10-CM | POA: Diagnosis not present

## 2018-06-02 DIAGNOSIS — E270 Other adrenocortical overactivity: Secondary | ICD-10-CM | POA: Diagnosis not present

## 2018-06-02 DIAGNOSIS — M797 Fibromyalgia: Secondary | ICD-10-CM | POA: Diagnosis not present

## 2018-06-20 ENCOUNTER — Telehealth: Payer: Self-pay

## 2018-06-20 NOTE — Telephone Encounter (Signed)
I called pt, per Dr. Rexene Alberts request, to advise pt that an appt is not needed on 06/23/18 as long as pt is doing well. Pt reports that she is doing well and agreed that the appt on 06/23/18 can be cancelled. Appt has been cancelled, pt understands to call us back if she prefers an appt.

## 2018-06-23 ENCOUNTER — Ambulatory Visit: Payer: Self-pay | Admitting: Neurology

## 2018-06-29 DIAGNOSIS — G629 Polyneuropathy, unspecified: Secondary | ICD-10-CM | POA: Diagnosis not present

## 2018-06-29 DIAGNOSIS — R05 Cough: Secondary | ICD-10-CM | POA: Diagnosis not present

## 2018-06-29 DIAGNOSIS — M797 Fibromyalgia: Secondary | ICD-10-CM | POA: Diagnosis not present

## 2018-06-29 DIAGNOSIS — K219 Gastro-esophageal reflux disease without esophagitis: Secondary | ICD-10-CM | POA: Diagnosis not present

## 2018-06-29 DIAGNOSIS — Z79899 Other long term (current) drug therapy: Secondary | ICD-10-CM | POA: Diagnosis not present

## 2018-07-01 ENCOUNTER — Ambulatory Visit (INDEPENDENT_AMBULATORY_CARE_PROVIDER_SITE_OTHER): Payer: Medicare Other | Admitting: Pulmonary Disease

## 2018-07-01 ENCOUNTER — Ambulatory Visit (INDEPENDENT_AMBULATORY_CARE_PROVIDER_SITE_OTHER)
Admission: RE | Admit: 2018-07-01 | Discharge: 2018-07-01 | Disposition: A | Discharge status: Home / Self Care | Payer: Medicare Other | Source: Ambulatory Visit | Attending: Pulmonary Disease | Admitting: Pulmonary Disease

## 2018-07-01 ENCOUNTER — Encounter: Payer: Self-pay | Admitting: Pulmonary Disease

## 2018-07-01 ENCOUNTER — Other Ambulatory Visit: Payer: Self-pay | Admitting: Pulmonary Disease

## 2018-07-01 VITALS — BP 118/64 | HR 85 | Ht 63.0 in | Wt 144.8 lb

## 2018-07-01 DIAGNOSIS — R0602 Shortness of breath: Secondary | ICD-10-CM

## 2018-07-01 DIAGNOSIS — R05 Cough: Secondary | ICD-10-CM | POA: Diagnosis not present

## 2018-07-01 DIAGNOSIS — R942 Abnormal results of pulmonary function studies: Secondary | ICD-10-CM

## 2018-07-01 LAB — CBC WITH DIFFERENTIAL/PLATELET
Basophils Absolute: 0 10*3/uL (ref 0.0–0.1)
Basophils Relative: 0.5 % (ref 0.0–3.0)
Eosinophils Absolute: 0.3 10*3/uL (ref 0.0–0.7)
Eosinophils Relative: 5.4 % — ABNORMAL HIGH (ref 0.0–5.0)
HCT: 40.5 % (ref 36.0–46.0)
Hemoglobin: 13.5 g/dL (ref 12.0–15.0)
Lymphocytes Relative: 22.1 % (ref 12.0–46.0)
Lymphs Abs: 1.4 10*3/uL (ref 0.7–4.0)
MCHC: 33.4 g/dL (ref 30.0–36.0)
MCV: 89.6 fl (ref 78.0–100.0)
Monocytes Absolute: 0.6 10*3/uL (ref 0.1–1.0)
Monocytes Relative: 10.2 % (ref 3.0–12.0)
Neutro Abs: 3.8 10*3/uL (ref 1.4–7.7)
Neutrophils Relative %: 61.8 % (ref 43.0–77.0)
Platelets: 207 10*3/uL (ref 150.0–400.0)
RBC: 4.52 Mil/uL (ref 3.87–5.11)
RDW: 13.7 % (ref 11.5–15.5)
WBC: 6.2 10*3/uL (ref 4.0–10.5)

## 2018-07-01 MED ORDER — LEVALBUTEROL TARTRATE 45 MCG/ACT IN AERO: 2.0000 | INHALATION_SPRAY | RESPIRATORY_TRACT | 2 refills | Status: DC | PRN

## 2018-07-01 NOTE — Progress Notes (Signed)
Synopsis: Referred in 06/2018 for Reduced DLCO  Subjective:   PATIENT ID: [name] GENDER: female DOB: 04/11/1941, MRN: [id number]   HPI  Chief Complaint  Patient presents with  . Consult    SOB with exercise and stair climbing - reflux throat and into nose.  recent PFT   78 yuear old female with fibromyalgia previously on 5 years low prednisone 2.5 mg, hx trigemina neuralgia s/p gamma knife who presents with dyspnea and abnormal PFT.  She reports gradually worsening dyspnea over last 6 months that is aggravated with stairs and exercise. She plays tennis in the spring and fall but has noticed she has to take frequent breaks due to dyspnea. Nothing improves her symptoms, only rest. Not currently taking inhaler. Reports nasal congestion, hoarseness secondary to laryngeal reflux Denies cough, wheezing or chest pain. For allergies, she uses netti pot with saline rinses and zyrtec. Follows with ENT for nasal congestion.   She had influenza two years ago that was very severe. Also had a co-comitant UTI that was treated with cipro however she developed tendonitis for which she was started on low dose prendone 2.5-5mg  x 2 years. She was weaned off steroids in May 2019. >1.5 years of low steroid therapy with intermittent doses for fibromyalgia, last burst given in June.   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 Lived in [place] for 3 years, used coal heating  I have personally reviewed patient's past medical/family/social history, allergies, current medications.  Past Medical History:  Diagnosis Date  . Adenomatous colon polyp 03/24/2018  . Allergy   . Arthritis   . Cataract   . Chronic headaches   . Fibromyalgia   . Fibromyalgia   . Gallstones   . GERD (gastroesophageal reflux disease)   . Hyperlipemia   . Hypertension   . Interstitial cystitis   . Lactose  intolerance   . LBBB (left bundle branch block)   . Osteoarthritis   . Pneumonia   . Thyroid disease   . Tinnitus   . Trigeminal neuralgia      Family History  Problem Relation Age of Onset  . Heart disease Mother   . Heart disease Father   . Stroke Father   . Stroke Sister   . Colon cancer Neg Hx      Social History   Occupational History  . Occupation: housewife    Employer: RETIRED  Tobacco Use  . Smoking status: Never Smoker  . Smokeless tobacco: Never Used  Substance and Sexual Activity  . Alcohol use: No    Alcohol/week: 0.0 standard drinks  . Drug use: No  . Sexual activity: Not on file    Allergies  Allergen Reactions  . Persantine [Dipyridamole] Other (See Comments)    Migraines   . Butrans [Buprenorphine] Other (See Comments)    "knocks me out"  . Codeine Nausea Only  . Doxycycline Rash  . Esomeprazole Other (See Comments)    Jittery, sore throat  . Irbesartan Diarrhea  . Lactose Intolerance (Gi) Other (See Comments)    GI upset   . Lactulose Other (See Comments)    GI upset  . Losartan Other (See Comments)    Hot and chills  . Maxzide [Hydrochlorothiazide W-Triamterene] Other (See Comments)    Cramps   . Micardis [Telmisartan] Other (See Comments)    headache  . Nortriptyline Hcl Other (See Comments)    "My  body doesn't metabolize it anymore."  . Oxycodone Other (See Comments)    Interstitial cystitis  . Oxycontin [Oxycodone Hcl] Other (See Comments)    Interstitial cystitis   . Pennsaid [Diclofenac Sodium] Hives  . Tramadol Itching    sedation     Outpatient Medications Prior to Visit  Medication Sig Dispense Refill  . cetirizine (ZYRTEC) 10 MG tablet Take 10 mg by mouth daily.    . Cholecalciferol (VITAMIN D-3) 5000 units TABS Take 1 tablet by mouth daily.    . Cyanocobalamin (VITAMIN B-12) 2500 MCG SUBL Place 1 tablet under the tongue daily.    Marland Kitchen dexlansoprazole (DEXILANT) 60 MG capsule TAKE 1 CAPSULE BY MOUTH ONCE DAILY    .  Digestive Enzymes (DIGESTIVE ENZYME ULTRA) CAPS Take 3 capsules by mouth daily.    . DULoxetine (CYMBALTA) 30 MG capsule Take 30 mg by mouth 3 (three) times daily.     Marland Kitchen HYDROcodone-acetaminophen (NORCO/VICODIN) 5-325 MG tablet Take 1 tablet by mouth every 6 (six) hours as needed for moderate pain.    Marland Kitchen levothyroxine (SYNTHROID, LEVOTHROID) 50 MCG tablet Take 50 mcg by mouth every morning.    . Magnesium 300 MG CAPS Take 2 capsules by mouth daily.    . nortriptyline (PAMELOR) 10 MG capsule Take 10 mg by mouth 3 (three) times daily.    . baclofen (LIORESAL) 10 MG tablet TAKE 1 2 (ONE HALF) TABLET BY MOUTH WITH FOOD OR MILK FOR 10 DAYS AND THEN TAKE 1 (ONE) TABLET BY MOUTH THREE TIMES DAILY  3  . Lactobacillus (MORE-DOPHILUS ACIDOPHILUS PO) Take by mouth.    . predniSONE 2 MG TBEC Take 1 mg by mouth every other day.     . RaNITidine HCl (ZANTAC 75 PO) Take by mouth.    . ropivacaine, PF, 5 mg/mL, 0.5%, (NAROPIN) 5 MG/ML injection Inject into the skin.    Marland Kitchen triamcinolone (NASACORT) 55 MCG/ACT AERO nasal inhaler USE 2 SPRAY(S) IN EACH NOSTRIL ONCE DAILY  11   No facility-administered medications prior to visit.     Review of Systems  Constitutional: Negative for chills, diaphoresis, fever, malaise/fatigue and weight loss.  HENT: Positive for congestion. Negative for sore throat.        Hoarseness  Respiratory: Positive for shortness of breath. Negative for cough, hemoptysis, sputum production and wheezing.   Cardiovascular: Negative for chest pain, orthopnea, leg swelling and PND.  Gastrointestinal: Negative for abdominal pain, heartburn and nausea.  Genitourinary: Negative for frequency.  Musculoskeletal: Negative for myalgias.  Skin: Negative for rash.  Neurological: Negative for dizziness, weakness and headaches.  Endo/Heme/Allergies: Positive for environmental allergies. Does not bruise/bleed easily.     Objective:   Vitals:   07/01/18 1023  BP: 118/64  Pulse: 85  SpO2: 100%    Weight: 144 lb 12.8 oz (65.7 kg)  Height: 5\' 3"  (1.6 m)   SpO2: 100 % O2 Device: None (Room air)  Physical Exam General: Well-appearing, no acute distress HENT: [place], AT, OP clear, MMM Eyes: EOMI, no scleral icterus Respiratory: Clear to auscultation bilaterally.  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 In-clinic ambulatory oxygen with no evidence of hypoxemia.  Dyspnea on exertion Reduced DLCO --Ambulatory O2 performed in clinic. --Obtain CXR --START xopenex inhaler 2 puffs as needed every 4-6 hours for shortness of breath or wheezing. Albuterol causes worsening of her baseline tremor. --Plan to repeat PFT --Obtain CBC --TTE  Orders Placed This Encounter  Procedures  . DG Chest 2 View    Standing Status:   Future    Number of Occurrences:   1    Standing Expiration Date:   08/30/2019    Order Specific Question:   Reason for Exam (SYMPTOM  OR DIAGNOSIS REQUIRED)    Answer:   shortness of breath    Order Specific Question:   Preferred imaging location?    Answer:   Internal    Order Specific Question:   Radiology Contrast Protocol - do NOT remove file path    Answer:   [email or web address]  . CBC with Differential/Platelet    Standing Status:   Future    Number of Occurrences:    1    Standing Expiration Date:   07/01/2019  . ECHOCARDIOGRAM COMPLETE    Standing Status:   Future    Standing Expiration Date:   09/30/2019    Scheduling Instructions:     Schedule as soon as convenient    Order Specific Question:   Where should this test be performed    Answer:   [facility])    Order Specific Question:   Does the patient weigh less than or greater than 250 lbs?    Answer:   Patient weighs less than 250 lbs    Order Specific Question:   Perflutren DEFINITY (image enhancing agent) should be administered unless hypersensitivity or allergy exist    Answer:   Administer Perflutren    Order Specific Question:   Reason for exam-Echo    Answer:   Other-Full Diagnosis List    Order Specific Question:   Full ICD-10/Reason for Exam    Answer:   Shortness of breath [786.05.ICD-9-CM]    Order Specific Question:   Other Comments    Answer:   reduced DLCO   Meds ordered this encounter  Medications  . levalbuterol (XOPENEX HFA) 45 MCG/ACT inhaler    Sig: Inhale 2 puffs into the lungs every 4 (four) hours as needed for wheezing.    Dispense:  1 Inhaler    Refill:  2    Return in about 6 weeks (around 08/12/2018).  Gerhardt Gleed Rodman Pickle, MD [facility] 07/03/2018 8:49 PM  Personal pager: [phone] If unanswered, please page CCM On-call: [phone]

## 2018-07-01 NOTE — Patient Instructions (Addendum)
Reduced DLCO --Ambulatory O2 performed in clinic. --Obtain CXR --START xopenex inhaler 2 puffs as needed every 4-6 hours for shortness of breath or wheezing. Albuterol causes worsening of her baseline tremor.

## 2018-07-03 ENCOUNTER — Encounter: Payer: Self-pay | Admitting: Pulmonary Disease

## 2018-07-04 ENCOUNTER — Ambulatory Visit (INDEPENDENT_AMBULATORY_CARE_PROVIDER_SITE_OTHER): Payer: Medicare Other | Admitting: Pulmonary Disease

## 2018-07-04 ENCOUNTER — Telehealth: Payer: Self-pay | Admitting: Pulmonary Disease

## 2018-07-04 DIAGNOSIS — R0602 Shortness of breath: Secondary | ICD-10-CM

## 2018-07-04 LAB — PULMONARY FUNCTION TEST
DL/VA % pred: 68 %
DL/VA: 3.2 ml/min/mmHg/L
DLCO cor % pred: 61 %
DLCO cor: 14.05 ml/min/mmHg
DLCO unc % pred: 61 %
DLCO unc: 14.1 ml/min/mmHg
FEF 25-75 Post: 2.5 L/sec
FEF 25-75 Pre: 2.3 L/sec
FEF2575-%Change-Post: 8 %
FEF2575-%Pred-Post: 171 %
FEF2575-%Pred-Pre: 158 %
FEV1-%Change-Post: 2 %
FEV1-%Pred-Post: 121 %
FEV1-%Pred-Pre: 118 %
FEV1-Post: 2.32 L
FEV1-Pre: 2.27 L
FEV1FVC-%Change-Post: 5 %
FEV1FVC-%Pred-Pre: 110 %
FEV6-%Change-Post: -2 %
FEV6-%Pred-Post: 110 %
FEV6-%Pred-Pre: 113 %
FEV6-Post: 2.69 L
FEV6-Pre: 2.76 L
FEV6FVC-%Change-Post: 0 %
FEV6FVC-%Pred-Post: 105 %
FEV6FVC-%Pred-Pre: 105 %
FVC-%Change-Post: -2 %
FVC-%Pred-Post: 104 %
FVC-%Pred-Pre: 107 %
FVC-Post: 2.69 L
FVC-Pre: 2.77 L
Post FEV1/FVC ratio: 86 %
Post FEV6/FVC ratio: 100 %
Pre FEV1/FVC ratio: 82 %
Pre FEV6/FVC Ratio: 99 %
RV % pred: 82 %
RV: 1.9 L
TLC % pred: 96 %
TLC: 4.72 L

## 2018-07-04 MED ORDER — IPRATROPIUM BROMIDE HFA 17 MCG/ACT IN AERS: INHALATION_SPRAY | RESPIRATORY_TRACT | 1 refills | Status: DC

## 2018-07-04 NOTE — Telephone Encounter (Signed)
Okay to fill Atrovent HFA.  Can use every 4-6 hours as needed for shortness of breath and wheezing.  Patient is to keep follow-up with Dr. Loanne Drilling as planned.

## 2018-07-04 NOTE — Telephone Encounter (Signed)
Called and spoke to pt.  Pt states she was given xopenex during PFT. Pt states within minutes of taking the xopenex, she developed tremors. Pt states that tremors have worsen since she got home.  PFT was at 12:00p today.  Pt also stated that she was prescribed xopenex HFA, however she is requesting alternative.  I have made pt aware that tremors are a common affect of xopenex.  Pt denied increased sob or trouble swallowing.  Pt is requesting recommendations.  Aaron Edelman please advise. Thanks

## 2018-07-04 NOTE — Progress Notes (Signed)
PFT done today. 

## 2018-07-04 NOTE — Telephone Encounter (Signed)
Pt is aware of below message and voiced her understanding. Rx for Atrovent HFA has been sent to preferred pharmacy. Nothing further is needed.

## 2018-07-04 NOTE — Telephone Encounter (Signed)
Patient returned call.  She stated that our number keeps getting blocked.  Wyn Quaker, NP recommendations given.  Patient stated that she has had no SHOB, or difficulty breathing, but she would like to try another medication.  Patient is wanting to start Atrovent HFA.  Wyn Quaker, NP, please advise if its ok to send Atrovent HFA

## 2018-07-04 NOTE — Telephone Encounter (Signed)
Is the patient wheezing or having any difficulty breathing?  The Xopenex as an as needed prescription.  She should not be using this regularly.   Worse case could consider an Atrovent HFA  Wyn Quaker FNP

## 2018-07-04 NOTE — Telephone Encounter (Signed)
ATC pt x3 and call back disconnected each time. Will call back

## 2018-07-05 ENCOUNTER — Ambulatory Visit (HOSPITAL_COMMUNITY): Payer: Medicare Other | Attending: Cardiology

## 2018-07-05 ENCOUNTER — Other Ambulatory Visit: Payer: Self-pay

## 2018-07-05 DIAGNOSIS — R0602 Shortness of breath: Secondary | ICD-10-CM | POA: Diagnosis not present

## 2018-07-08 ENCOUNTER — Telehealth: Payer: Self-pay | Admitting: Pulmonary Disease

## 2018-07-08 DIAGNOSIS — R0602 Shortness of breath: Secondary | ICD-10-CM

## 2018-07-08 NOTE — Telephone Encounter (Signed)
Primary Pulmonologist: JE Last office visit and with whom: Last ov on 07/01/2018 with JE What do we see them for (pulmonary problems): SOB Last OV assessment/plan:  Versions: 1. Kristina Seeds, MD (Physician) at 07/01/2018 10:53 AM - Signed    Reduced DLCO --Ambulatory O2 performed in clinic. --Obtain CXR --START xopenex inhaler 2 puffs as needed every 4-6 hours for shortness of breath or wheezing. Albuterol causes worsening of her baseline tremor.     Reason for call: Patient is calling to find out detail results of her PFT completed on 07/04/2018 and her Echo completed on 07/05/2018. Checked in result notes, and do not results for these tests. We have advised patient once these are reviewed, we will contact her with the results.   JE, please advise once these tests have been resulted. Thank you.

## 2018-07-08 NOTE — Telephone Encounter (Signed)
Primary Pulmonologist: JE Last office visit and with whom: Last ov with JE on 07/01/2018 What do we see them for (pulmonary problems): SOB Last OV assessment/plan:  Versions: 1. Kristina Seeds, MD (Physician) at 07/01/2018 10:53 AM - Signed    Reduced DLCO --Ambulatory O2 performed in clinic. --Obtain CXR --START xopenex inhaler 2 puffs as needed every 4-6 hours for shortness of breath or wheezing. Albuterol causes worsening of her baseline tremor.     Reason for call: Patient stated that she has found to have an enlarged heart since Atrovent was prescribed and wants to know if she still needs this filled. She has not filled this at the pharmacy at this time. She is worried to take this medication at this time.   JE please advise. Thank you.

## 2018-07-10 NOTE — Chronic Care Management (AMB) (Signed)
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

## 2018-07-10 NOTE — Telephone Encounter (Signed)
[name],   As we discussed on the phone, I have reviewed your tests ordered at our last visit. Your pulmonary function test again demonstrates a moderately decreased gas exchange. The ultrasound of your heart shows mild diastolic heart dysfunction but this is can be a routine finding especially in patients as they become older.   The report does note mildly increased pressure in your pulmonary arteries which does not normally cause symptoms in patients however with your abnormal pulmonary function test, I would like to order another test to look at your lung perfusion to see if this may be related to your breathing.   On the phone, you mentioned having symptoms of sore throat, chest congestions and increasing productive cough with white/yellow sputum. No fevers or chills. Based on these symptoms, I recommend conservative management with Flonase and Mucinex DM as needed. If your symptoms do not improve, I will have an antibiotic ordered after I review with pharmacy the best option for you based on your genetics (poor CYP450 metabolizer).   For any questions or concerns, please contact our office.   Rodman Pickle, MD

## 2018-07-11 ENCOUNTER — Other Ambulatory Visit: Payer: Self-pay

## 2018-07-11 DIAGNOSIS — R0602 Shortness of breath: Secondary | ICD-10-CM

## 2018-07-11 NOTE — Addendum Note (Signed)
Addended by: Joella Prince on: 07/11/2018 03:26 PM   Modules accepted: Orders

## 2018-07-12 ENCOUNTER — Telehealth: Payer: Self-pay | Admitting: Pulmonary Disease

## 2018-07-12 NOTE — Telephone Encounter (Signed)
Dr. Loanne Drilling please advise if you have received these results. Thank you.

## 2018-07-12 NOTE — Telephone Encounter (Signed)
Yes. Please see 07/08/18 telephone encounter. Results have been discussed with patient with plan to do additional testing.  Rodman Pickle, MD

## 2018-07-12 NOTE — Telephone Encounter (Signed)
Spoke with the pt  I advised that the cxr is done as part of the protocol for a vq scan  She verbalized understanding and is okay with this  Nothing further needed

## 2018-07-14 DIAGNOSIS — E039 Hypothyroidism, unspecified: Secondary | ICD-10-CM | POA: Diagnosis not present

## 2018-07-14 DIAGNOSIS — E270 Other adrenocortical overactivity: Secondary | ICD-10-CM | POA: Diagnosis not present

## 2018-07-15 ENCOUNTER — Encounter (HOSPITAL_COMMUNITY)
Admission: RE | Admit: 2018-07-15 | Discharge: 2018-07-15 | Disposition: A | Discharge status: Home / Self Care | Payer: Medicare Other | Source: Ambulatory Visit | Attending: Pulmonary Disease | Admitting: Pulmonary Disease

## 2018-07-15 DIAGNOSIS — R0602 Shortness of breath: Secondary | ICD-10-CM

## 2018-07-15 MED ORDER — TECHNETIUM TO 99M ALBUMIN AGGREGATED
4.0000 | Freq: Once | INTRAVENOUS | Status: AC | PRN
  Administered 2018-07-15: 4 via INTRAVENOUS

## 2018-07-15 MED ORDER — TECHNETIUM TC 99M DIETHYLENETRIAME-PENTAACETIC ACID
31.0000 | Freq: Once | INTRAVENOUS | Status: AC | PRN
  Administered 2018-07-15: 31 via RESPIRATORY_TRACT

## 2018-07-16 ENCOUNTER — Other Ambulatory Visit: Payer: Self-pay | Admitting: Pulmonary Disease

## 2018-07-16 MED ORDER — CEFPODOXIME PROXETIL 200 MG PO TABS: 200.0000 mg | ORAL_TABLET | Freq: Two times a day (BID) | ORAL | 0 refills | Status: AC

## 2018-07-20 DIAGNOSIS — K219 Gastro-esophageal reflux disease without esophagitis: Secondary | ICD-10-CM | POA: Diagnosis not present

## 2018-07-20 DIAGNOSIS — M797 Fibromyalgia: Secondary | ICD-10-CM | POA: Diagnosis not present

## 2018-07-20 DIAGNOSIS — M159 Polyosteoarthritis, unspecified: Secondary | ICD-10-CM | POA: Diagnosis not present

## 2018-07-20 DIAGNOSIS — G629 Polyneuropathy, unspecified: Secondary | ICD-10-CM | POA: Diagnosis not present

## 2018-07-21 DIAGNOSIS — E270 Other adrenocortical overactivity: Secondary | ICD-10-CM | POA: Diagnosis not present

## 2018-07-22 ENCOUNTER — Telehealth: Payer: Self-pay | Admitting: Pulmonary Disease

## 2018-07-22 NOTE — Telephone Encounter (Signed)
Tried to call patient, no VM set up. X1 Pt was checking if [facility] pharmacy sent in [place] request for atrovent. Talked with Demetrio Lapping, there is no PA at this time for this patient. Called [facility] spoke with Sammie at [phone] to fax over PA request today Waiting on PA request  Routing message to Andrew to f/u

## 2018-07-26 DIAGNOSIS — H2513 Age-related nuclear cataract, bilateral: Secondary | ICD-10-CM | POA: Diagnosis not present

## 2018-07-26 DIAGNOSIS — H25013 Cortical age-related cataract, bilateral: Secondary | ICD-10-CM | POA: Diagnosis not present

## 2018-07-26 DIAGNOSIS — H2512 Age-related nuclear cataract, left eye: Secondary | ICD-10-CM | POA: Diagnosis not present

## 2018-07-26 DIAGNOSIS — H18413 Arcus senilis, bilateral: Secondary | ICD-10-CM | POA: Diagnosis not present

## 2018-07-26 DIAGNOSIS — H25043 Posterior subcapsular polar age-related cataract, bilateral: Secondary | ICD-10-CM | POA: Diagnosis not present

## 2018-07-26 DIAGNOSIS — H353131 Nonexudative age-related macular degeneration, bilateral, early dry stage: Secondary | ICD-10-CM | POA: Diagnosis not present

## 2018-07-26 NOTE — Telephone Encounter (Signed)
[facility], [phone], spoke with Bridgett. She sent fax requesting PA for Atrovent. [place], [phone] to initiate atrovent PA.  PA will take 24-72 hours.  PA call back # [phone] to check PA status.  Reference # [id number].  Message routed to [facility], to follow up

## 2018-07-27 DIAGNOSIS — Z8669 Personal history of other diseases of the nervous system and sense organs: Secondary | ICD-10-CM | POA: Diagnosis not present

## 2018-07-27 DIAGNOSIS — Z923 Personal history of irradiation: Secondary | ICD-10-CM | POA: Diagnosis not present

## 2018-07-27 DIAGNOSIS — Z48811 Encounter for surgical aftercare following surgery on the nervous system: Secondary | ICD-10-CM | POA: Diagnosis not present

## 2018-07-27 DIAGNOSIS — Z09 Encounter for follow-up examination after completed treatment for conditions other than malignant neoplasm: Secondary | ICD-10-CM | POA: Diagnosis not present

## 2018-07-27 DIAGNOSIS — G500 Trigeminal neuralgia: Secondary | ICD-10-CM | POA: Diagnosis not present

## 2018-07-27 NOTE — Telephone Encounter (Signed)
Per triage protocol, this PA will need to be followed up on by triage.

## 2018-07-27 NOTE — Telephone Encounter (Signed)
Called and spoke with Patient. Patient was aware the Atrovent inhaler PA was approved, but she stated that it was $400. She wanted to know if Dr Loanne Drilling really thought she needed it. Advised Patient that it was as needed for [facility].  Patient feels like she does not need emergency inhaler.  Dr Loanne Drilling, please advise if Patient can stay off Atrovent inhaler, or recommendations

## 2018-07-27 NOTE — Telephone Encounter (Signed)
Patient returning call, CB is [phone]

## 2018-07-27 NOTE — Telephone Encounter (Signed)
Received fax from [facility] that Atrovent HFA inhaler is approved through 06-22-2019 for patient.   Called patient was unable to talk to patient, but left message with daughter to have patient call back.  Called pharmacy to let them aware this was approved so they can fill for patient.

## 2018-07-28 NOTE — Telephone Encounter (Signed)
Spoke with pt's husband, Darnell Level. He is aware of Dr. Cordelia Pen response. Nothing further was needed at this time.

## 2018-07-28 NOTE — Telephone Encounter (Signed)
If she does not feel she needs it, then she does not have to fill the prescription.

## 2018-08-01 DIAGNOSIS — H2512 Age-related nuclear cataract, left eye: Secondary | ICD-10-CM | POA: Diagnosis not present

## 2018-08-02 DIAGNOSIS — H2511 Age-related nuclear cataract, right eye: Secondary | ICD-10-CM | POA: Diagnosis not present

## 2018-08-05 ENCOUNTER — Telehealth: Payer: Self-pay | Admitting: General Surgery

## 2018-08-05 NOTE — Telephone Encounter (Signed)
Fax received from [place] is not covered by the patient's insurance. It is not on the formulary. Called [phone] - [employer] (was listed on the fax received) They did not have any other medications in the same class as Atorvent listed for approval on formulary. Was told by rep named Lucine the only alternative covered was the Ipratropium Bromide solution 0.02%.   Dr. Loanne Drilling, based on the information above. Will the alternative be appropriate for this patient?

## 2018-08-07 NOTE — Telephone Encounter (Signed)
Yes, that is appropriate.  Ipratropium Bromide solution q6h as needed for shortness of breath or wheezing

## 2018-08-08 NOTE — Telephone Encounter (Signed)
Left message for patient to see if she is ok with this change.

## 2018-08-09 DIAGNOSIS — M170 Bilateral primary osteoarthritis of knee: Secondary | ICD-10-CM | POA: Diagnosis not present

## 2018-08-09 DIAGNOSIS — M150 Primary generalized (osteo)arthritis: Secondary | ICD-10-CM | POA: Diagnosis not present

## 2018-08-09 DIAGNOSIS — M25559 Pain in unspecified hip: Secondary | ICD-10-CM | POA: Diagnosis not present

## 2018-08-09 DIAGNOSIS — M25562 Pain in left knee: Secondary | ICD-10-CM | POA: Diagnosis not present

## 2018-08-09 DIAGNOSIS — M797 Fibromyalgia: Secondary | ICD-10-CM | POA: Diagnosis not present

## 2018-08-16 ENCOUNTER — Encounter: Payer: Self-pay | Admitting: Pulmonary Disease

## 2018-08-16 ENCOUNTER — Ambulatory Visit (INDEPENDENT_AMBULATORY_CARE_PROVIDER_SITE_OTHER): Payer: Medicare Other | Admitting: Pulmonary Disease

## 2018-08-16 VITALS — BP 112/70 | HR 77 | Ht 63.0 in | Wt 145.0 lb

## 2018-08-16 DIAGNOSIS — R942 Abnormal results of pulmonary function studies: Secondary | ICD-10-CM

## 2018-08-16 DIAGNOSIS — R0600 Dyspnea, unspecified: Secondary | ICD-10-CM

## 2018-08-16 DIAGNOSIS — R0609 Other forms of dyspnea: Secondary | ICD-10-CM

## 2018-08-16 NOTE — Patient Instructions (Signed)
Followup with Dr Loanne Drilling in 4-5 months, sooner if needed

## 2018-08-16 NOTE — Progress Notes (Signed)
Synopsis: Referred in 06/2018 for Reduced DLCO  Subjective:   PATIENT ID: [name] GENDER: female DOB: 1940/07/06, MRN: [id number]   HPI  Chief Complaint  Patient presents with  . Follow-up    Breathing has improved. She is not using any inhalers.    Ms. [name] is a 78 year old female never smoker with fibromyalgia previously on 5 years low prednisone 2.5 mg, hx trigemina neuralgia s/p gamma knife who presents with dyspnea on exertion x 6 months and abnormal PFT.  Since our last visit, PFT again demonstrated a moderately reduced gas exchange.  Work-up included TTE with mild diastolic heart dysfunction and VQ scan negative for ventilation or perfusion defects. She was prescribed xoponex however did not tolerate due to tremors. Alternative inhalers including LAMA nebulizer ordered but she has not started this medication. She overall feels her dyspnea has improved without intervention. She is trying to work on being more active so she can start playing tennis again. Followed by ENT for nasal congestion, continues to use zyrtec and Netti pot. Denies fevers, chills, chest pain, wheezing. 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 Lived in [place] for 3 years, used coal heating  I have personally reviewed patient's past medical/family/social history/allergies/current medications.   Allergies  Allergen Reactions  . Persantine [Dipyridamole] Other (See Comments)    Migraines   . Butrans [Buprenorphine] Other (See Comments)    "knocks me out"  . Codeine Nausea Only  . Doxycycline Rash  . Esomeprazole Other (See Comments)    Jittery, sore throat  . Irbesartan Diarrhea  . Lactose Intolerance (Gi) Other (See Comments)    GI upset   . Lactulose Other (See Comments)    GI upset  . Losartan Other (See Comments)    Hot and chills  . Maxzide  [Hydrochlorothiazide W-Triamterene] Other (See Comments)    Cramps   . Micardis [Telmisartan] Other (See Comments)    headache  . Nortriptyline Hcl Other (See Comments)    "My body doesn't metabolize it anymore."  . Oxycodone Other (See Comments)    Interstitial cystitis  . Oxycontin [Oxycodone Hcl] Other (See Comments)    Interstitial cystitis   . Pennsaid [Diclofenac Sodium] Hives  . Tramadol Itching    sedation     Outpatient Medications Prior to Visit  Medication Sig Dispense Refill  . cetirizine (ZYRTEC) 10 MG tablet Take 10 mg by mouth daily.    . Cholecalciferol (VITAMIN D-3) 5000 units TABS Take 1 tablet by mouth daily.    . Cyanocobalamin (VITAMIN B-12) 2500 MCG SUBL Place 1 tablet under the tongue daily.    . Digestive Enzymes (DIGESTIVE ENZYME ULTRA) CAPS Take 3 capsules by mouth daily.    . DULoxetine (CYMBALTA) 30 MG capsule Take 30 mg by mouth 3 (three) times daily.     . fluticasone (FLONASE) 50 MCG/ACT nasal spray Place 2 sprays into both nostrils daily.    Marland Kitchen HYDROcodone-acetaminophen (NORCO/VICODIN) 5-325 MG tablet Take 1 tablet by mouth every 6 (six) hours as needed for moderate pain.    Marland Kitchen levothyroxine (SYNTHROID, LEVOTHROID) 75 MCG tablet Take 1 tablet by mouth daily.    . Magnesium 300 MG CAPS Take 2 capsules by mouth daily.    . nortriptyline (PAMELOR) 10 MG capsule Take 10 mg by mouth 3 (three) times daily.    Marland Kitchen UNABLE TO FIND Med Name: Naltrexone 4 mg  daily    . dexlansoprazole (DEXILANT) 60 MG capsule TAKE 1 CAPSULE BY MOUTH ONCE DAILY    . ipratropium (ATROVENT HFA) 17 MCG/ACT inhaler Use every 4-6hr PRN for shortness of breathe and wheezing 1 Inhaler 1  . levalbuterol (XOPENEX HFA) 45 MCG/ACT inhaler Inhale 2 puffs into the lungs every 4 (four) hours as needed for wheezing. 1 Inhaler 2  . levothyroxine (SYNTHROID, LEVOTHROID) 50 MCG tablet Take 50 mcg by mouth every morning.    . triamcinolone (NASACORT) 55 MCG/ACT AERO nasal inhaler USE 2 SPRAY(S) IN EACH  NOSTRIL ONCE DAILY  11   No facility-administered medications prior to visit.     Review of Systems  Constitutional: Negative for chills, diaphoresis, fever, malaise/fatigue and weight loss.  HENT: Positive for congestion. Negative for ear pain and sore throat.   Respiratory: Positive for shortness of breath. Negative for cough, hemoptysis, sputum production and wheezing.   Cardiovascular: Negative for chest pain, palpitations and leg swelling.  Gastrointestinal: Negative for abdominal pain, heartburn and nausea.  Genitourinary: Negative for frequency.  Musculoskeletal: Negative for joint pain and myalgias.  Skin: Negative for itching and rash.  Neurological: Negative for dizziness, weakness and headaches.  Endo/Heme/Allergies: Positive for environmental allergies. Does not bruise/bleed easily.  Psychiatric/Behavioral: Negative for depression. The patient is not nervous/anxious.      Objective:   Vitals:   08/16/18 1048  BP: 112/70  Pulse: 77  SpO2: 99%  Weight: 145 lb (65.8 kg)  Height: 5\' 3"  (1.6 m)   SpO2: 99 % O2 Device: None (Room air)  Physical Exam: General: Well-appearing, no acute distress HENT: Elias-Fela Solis, AT Eyes: EOMI, no scleral icterus Respiratory: Clear to auscultation bilaterally.  No crackles, wheezing or rales Cardiovascular: RRR, -M/R/G, no JVD GI: BS+, soft, nontender Extremities:-Edema,-tenderness Neuro: AAO x4, CNII-XII grossly intact Skin: Intact, no rashes or bruising Psych: Normal mood, normal affect  Data Imaging CXR 07/15/18 - no infiltrate, edema or effusion. VQ scan 07/15/18 - no V/Q mismatch  PFT FVC 2.77 (107%) FEV1 2.27 (118%) Ratio 82  TLC 96% DLCO 61% Interpretation: Normal spirometry. Mildly reduced DLCO  TTE 07/05/18 Normal EF 55-60%. Mild diastolic dysfunction, mild TR, mild PH  Labs CBC 07/01/18 reviewed. Hg appropriate. 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   Assessment & Plan:   Abnormal PFTs: Isolated reduction in DLCO  Dyspnea on exertion  Discussion: 78 year old female with fibromyalgia, poor CYP450 metabolizer who presents with dyspnea on exertion and abnormal PFTs. Isolated reduction in DLCO seen on PFTs however additional work-up is unrevealing. TTE with mild diastolic dysfunction and PH however patient euvolemic and no evidence of hypoxemia. She is unable to tolerate inhalers due to tremors. Currently reports improvement in dyspnea without intervention. If worsens, can consider starting inhaler without beta-agonist component.  The reduction in DLCO can sometimes be an early marker in ILD however imaging and testing do not currently support this. And she does not have known exposures or s/sx that would suggest a possible ILD. Symptoms have improved without intervention and her dyspnea may more likely be related to deconditioning. Will continue to observe for now.  Dyspnea on exertion Reduced DLCO -Encourage regular aerobic activity to improve endurance and stamina -Consider atrovent nebulizer if symptoms worsen -Consider HRCT Chest if symptoms progress -Repeat PFTs in 1 year to monitor DLCO  No orders of the defined types were placed in  this encounter.  No orders of the defined types were placed in this encounter.   Return in about 3 months (around 11/14/2018).   This 25-minute office visit was spent face-to-face in counseling with the patient/family. We discussed medical diagnosis and treatment plan as noted.  Conley Delisle Rodman Pickle, MD [facility] 08/21/2018 11:26 AM  Personal pager: [phone] If unanswered, please page CCM On-call: [phone]

## 2018-08-17 DIAGNOSIS — R8279 Other abnormal findings on microbiological examination of urine: Secondary | ICD-10-CM | POA: Diagnosis not present

## 2018-08-17 DIAGNOSIS — Z87440 Personal history of urinary (tract) infections: Secondary | ICD-10-CM | POA: Diagnosis not present

## 2018-08-17 DIAGNOSIS — N183 Chronic kidney disease, stage 3 (moderate): Secondary | ICD-10-CM | POA: Diagnosis not present

## 2018-08-17 DIAGNOSIS — N3010 Interstitial cystitis (chronic) without hematuria: Secondary | ICD-10-CM | POA: Diagnosis not present

## 2018-08-17 DIAGNOSIS — N3946 Mixed incontinence: Secondary | ICD-10-CM | POA: Diagnosis not present

## 2018-08-18 DIAGNOSIS — M797 Fibromyalgia: Secondary | ICD-10-CM | POA: Diagnosis not present

## 2018-08-18 DIAGNOSIS — Z79899 Other long term (current) drug therapy: Secondary | ICD-10-CM | POA: Diagnosis not present

## 2018-08-18 DIAGNOSIS — I10 Essential (primary) hypertension: Secondary | ICD-10-CM | POA: Diagnosis not present

## 2018-08-21 ENCOUNTER — Encounter: Payer: Self-pay | Admitting: Pulmonary Disease

## 2018-08-21 DIAGNOSIS — R942 Abnormal results of pulmonary function studies: Secondary | ICD-10-CM | POA: Insufficient documentation

## 2018-08-26 DIAGNOSIS — H2511 Age-related nuclear cataract, right eye: Secondary | ICD-10-CM | POA: Diagnosis not present

## 2018-08-29 DIAGNOSIS — M25559 Pain in unspecified hip: Secondary | ICD-10-CM | POA: Diagnosis not present

## 2018-08-29 DIAGNOSIS — M150 Primary generalized (osteo)arthritis: Secondary | ICD-10-CM | POA: Diagnosis not present

## 2018-08-29 DIAGNOSIS — M25562 Pain in left knee: Secondary | ICD-10-CM | POA: Diagnosis not present

## 2018-08-29 DIAGNOSIS — M797 Fibromyalgia: Secondary | ICD-10-CM | POA: Diagnosis not present

## 2018-08-29 DIAGNOSIS — M25561 Pain in right knee: Secondary | ICD-10-CM | POA: Diagnosis not present

## 2018-08-29 DIAGNOSIS — M170 Bilateral primary osteoarthritis of knee: Secondary | ICD-10-CM | POA: Diagnosis not present

## 2018-09-07 DIAGNOSIS — R432 Parageusia: Secondary | ICD-10-CM | POA: Diagnosis not present

## 2018-09-07 DIAGNOSIS — M797 Fibromyalgia: Secondary | ICD-10-CM | POA: Diagnosis not present

## 2018-09-07 DIAGNOSIS — R682 Dry mouth, unspecified: Secondary | ICD-10-CM | POA: Diagnosis not present

## 2018-10-05 DIAGNOSIS — N189 Chronic kidney disease, unspecified: Secondary | ICD-10-CM | POA: Diagnosis not present

## 2018-11-03 DIAGNOSIS — Z124 Encounter for screening for malignant neoplasm of cervix: Secondary | ICD-10-CM | POA: Diagnosis not present

## 2018-11-03 DIAGNOSIS — Z1231 Encounter for screening mammogram for malignant neoplasm of breast: Secondary | ICD-10-CM | POA: Diagnosis not present

## 2018-11-03 DIAGNOSIS — Z6826 Body mass index (BMI) 26.0-26.9, adult: Secondary | ICD-10-CM | POA: Diagnosis not present

## 2018-11-07 DIAGNOSIS — M25561 Pain in right knee: Secondary | ICD-10-CM | POA: Diagnosis not present

## 2018-11-07 DIAGNOSIS — M797 Fibromyalgia: Secondary | ICD-10-CM | POA: Diagnosis not present

## 2018-11-07 DIAGNOSIS — G8929 Other chronic pain: Secondary | ICD-10-CM | POA: Diagnosis not present

## 2018-11-07 DIAGNOSIS — M25562 Pain in left knee: Secondary | ICD-10-CM | POA: Diagnosis not present

## 2018-11-28 DIAGNOSIS — R5383 Other fatigue: Secondary | ICD-10-CM | POA: Diagnosis not present

## 2018-11-28 DIAGNOSIS — M25561 Pain in right knee: Secondary | ICD-10-CM | POA: Diagnosis not present

## 2018-11-28 DIAGNOSIS — M25559 Pain in unspecified hip: Secondary | ICD-10-CM | POA: Diagnosis not present

## 2018-11-28 DIAGNOSIS — M170 Bilateral primary osteoarthritis of knee: Secondary | ICD-10-CM | POA: Diagnosis not present

## 2018-11-28 DIAGNOSIS — M797 Fibromyalgia: Secondary | ICD-10-CM | POA: Diagnosis not present

## 2018-11-28 DIAGNOSIS — M150 Primary generalized (osteo)arthritis: Secondary | ICD-10-CM | POA: Diagnosis not present

## 2018-11-28 DIAGNOSIS — M25562 Pain in left knee: Secondary | ICD-10-CM | POA: Diagnosis not present

## 2018-12-08 DIAGNOSIS — Z8619 Personal history of other infectious and parasitic diseases: Secondary | ICD-10-CM | POA: Diagnosis not present

## 2018-12-08 DIAGNOSIS — E7800 Pure hypercholesterolemia, unspecified: Secondary | ICD-10-CM | POA: Diagnosis not present

## 2018-12-08 DIAGNOSIS — I10 Essential (primary) hypertension: Secondary | ICD-10-CM | POA: Diagnosis not present

## 2018-12-08 DIAGNOSIS — E039 Hypothyroidism, unspecified: Secondary | ICD-10-CM | POA: Diagnosis not present

## 2018-12-08 DIAGNOSIS — N390 Urinary tract infection, site not specified: Secondary | ICD-10-CM | POA: Diagnosis not present

## 2018-12-12 ENCOUNTER — Ambulatory Visit (INDEPENDENT_AMBULATORY_CARE_PROVIDER_SITE_OTHER): Payer: Medicare Other | Admitting: Primary Care

## 2018-12-12 ENCOUNTER — Other Ambulatory Visit: Payer: Self-pay

## 2018-12-12 ENCOUNTER — Ambulatory Visit: Payer: Medicare Other | Admitting: Pulmonary Disease

## 2018-12-12 ENCOUNTER — Encounter: Payer: Self-pay | Admitting: Primary Care

## 2018-12-12 DIAGNOSIS — R0600 Dyspnea, unspecified: Secondary | ICD-10-CM

## 2018-12-12 DIAGNOSIS — R0609 Other forms of dyspnea: Secondary | ICD-10-CM | POA: Diagnosis not present

## 2018-12-12 NOTE — Progress Notes (Signed)
Virtual Visit via Telephone Note  I connected with [name] on 12/12/18 at  9:30 AM EDT by telephone and verified that I am speaking with the correct person using two identifiers.  Location: Patient: Home Provider: Office   I discussed the limitations, risks, security and privacy concerns of performing an evaluation and management service by telephone and the availability of in person appointments. I also discussed with the patient that there may be a patient responsible charge related to this service. The patient expressed understanding and agreed to proceed.   History of Present Illness: 78 year old female, never smoked. PMH significant for fibromyalgia, trigeminal neuralgia, decreased diffusion capacity, LPRD, mild diastolic heart dysfunction. Patient of Dr. Loanne Drilling, last seen on 08/16/18. Plan repeat PFTs with DLCO in 1 year. Encouraged regular aerobic activity. Consider HRCT and atrovent nebulizer if symptoms worsen.  12/12/2018 Patient contacted today for 3-4 month televisit. She is doing well, no acute complaints. Breathing is much better. She was having difficulty with sob going up steps. She is on 5mg  prednisone for knee/hip/muscle pain. She is not having trouble climbing stairs as much now so her breathing is not as labored. Denies shortness of breath or cough. Continues Flonase and Zyrtec 10mg  daily. Takes pantoprazole as needed.    Observations/Objective:  - No shortness of breath, wheezing or cough noted during phone conversation  Assessment and Plan:  Dyspnea/ Decreased diffusion capacity - Breathing improved/stable  - Repeat PFTs with DLCO due Dec/Jan 2021  - Encouraged patient to stay active with cardiovascular exercise  - FU with Dr. Loanne Drilling in 6 months   Seasonal allergies - Continue Flonase and zyrtec 10mg  daily  Fibromyalgia - Continue 5mg  prednisone daily    Follow Up Instructions:   - 6 month follow up with PFTs  I discussed the assessment and  treatment plan with the patient. The patient was provided an opportunity to ask questions and all were answered. The patient agreed with the plan and demonstrated an understanding of the instructions.   The patient was advised to call back or seek an in-person evaluation if the symptoms worsen or if the condition fails to improve as anticipated.  I provided 12 minutes of non-face-to-face time during this encounter.   Martyn Ehrich, NP

## 2018-12-12 NOTE — Patient Instructions (Addendum)
It was a pleasure speaking with you today, I am glad that you are feeling well!  Encourage you to stay active and increase physical activity  Follow up with Dr. Loanne Drilling in 6 months with Pulmonary function testing and DLCO   If you develop worsening shortness of breath call/return sooner

## 2018-12-13 DIAGNOSIS — M8580 Other specified disorders of bone density and structure, unspecified site: Secondary | ICD-10-CM | POA: Diagnosis not present

## 2018-12-13 DIAGNOSIS — K219 Gastro-esophageal reflux disease without esophagitis: Secondary | ICD-10-CM | POA: Diagnosis not present

## 2018-12-13 DIAGNOSIS — E039 Hypothyroidism, unspecified: Secondary | ICD-10-CM | POA: Diagnosis not present

## 2018-12-13 DIAGNOSIS — M797 Fibromyalgia: Secondary | ICD-10-CM | POA: Diagnosis not present

## 2018-12-13 DIAGNOSIS — I10 Essential (primary) hypertension: Secondary | ICD-10-CM | POA: Diagnosis not present

## 2018-12-13 DIAGNOSIS — N3010 Interstitial cystitis (chronic) without hematuria: Secondary | ICD-10-CM | POA: Diagnosis not present

## 2018-12-13 DIAGNOSIS — J309 Allergic rhinitis, unspecified: Secondary | ICD-10-CM | POA: Diagnosis not present

## 2018-12-13 DIAGNOSIS — Z0000 Encounter for general adult medical examination without abnormal findings: Secondary | ICD-10-CM | POA: Diagnosis not present

## 2019-01-09 DIAGNOSIS — Z7952 Long term (current) use of systemic steroids: Secondary | ICD-10-CM | POA: Diagnosis not present

## 2019-01-10 DIAGNOSIS — Z7952 Long term (current) use of systemic steroids: Secondary | ICD-10-CM | POA: Diagnosis not present

## 2019-01-18 ENCOUNTER — Other Ambulatory Visit: Payer: Self-pay

## 2019-02-20 ENCOUNTER — Telehealth: Payer: Self-pay | Admitting: Pulmonary Disease

## 2019-02-20 NOTE — Telephone Encounter (Signed)
ATC patient x's 3.  Call rings 1 time then clicks.  No answer, unable to leave VM. Will try to schedule flu vaccine at another time.

## 2019-02-22 ENCOUNTER — Encounter: Payer: Self-pay | Admitting: Neurology

## 2019-02-22 ENCOUNTER — Other Ambulatory Visit: Payer: Self-pay

## 2019-02-22 ENCOUNTER — Ambulatory Visit (INDEPENDENT_AMBULATORY_CARE_PROVIDER_SITE_OTHER): Payer: Medicare Other | Admitting: Neurology

## 2019-02-22 VITALS — BP 156/75 | HR 72 | Ht 63.0 in | Wt 145.0 lb

## 2019-02-22 DIAGNOSIS — R519 Headache, unspecified: Secondary | ICD-10-CM

## 2019-02-22 DIAGNOSIS — G250 Essential tremor: Secondary | ICD-10-CM

## 2019-02-22 DIAGNOSIS — R51 Headache: Secondary | ICD-10-CM | POA: Diagnosis not present

## 2019-02-22 MED ORDER — PRIMIDONE 50 MG PO TABS: ORAL_TABLET | ORAL | 5 refills | Status: DC

## 2019-02-22 NOTE — Patient Instructions (Addendum)
For your recurrent left facial pain, please check with Dr. Arlan Organ if you would be a candidate for a repeat gamma knife procedure.  Please also talk to Dr. Shelia Media about the possibility of seeing a pain management doctor next, I do not have any additional suggestions for your facial pain, we have tried multiple medications in the past. For your tremor, we can try low-dose Mysoline (primidone) 50 mg strength: Take 1/2 pill each bedtime for 2 weeks, then 1 pill each bedtime thereafter. Common side effects reported are: Sleepiness, drowsiness, balance problems, confusion, and GI related symptoms. Please check with your rheumatologist if you should restart the naltrexone.  Please also talk to Dr. Shelia Media about your prednisone and whether you can take the shingles shots while on prednisone.

## 2019-02-22 NOTE — Progress Notes (Signed)
Subjective:    Patient ID: [name] is a 78 y.o. female.  HPI     Interim history:   [name] is a very pleasant 78 year old right-handed woman with an underlying medical history of osteoarthritis, allergic rhinitis, fibromyalgia, hyperlipidemia, osteopenia, left bundle branch block, tinnitus, hearing loss, hypertension, trigeminal neuralgia and essential tremor, who presents for follow-up consultation of her essential tremor.  The patient is unaccompanied today.  I last saw her on 11/10/2017, at which time she reported worsening facial pain.  I suggested we try her on Trileptal.  She had side effects with the Tegretol.    She called in the interim in June 2019 reporting that she felt too sleepy from the Trileptal.  She was agreeable to a referral to [facility] for consideration for gamma knife.  She saw Dr. Arlan Organ for this.  She had gamma knife radiosurgery for L trigeminal neuralgia in the interim, on 01/21/2018 and did well.  She had a six-month follow-up appointment with Dr. Arlan Organ in February 2020 and I reviewed the note.  Today, 02/22/2019: She reports that her left facial pain came back about a month ago.  She has had some fluctuation in her tremor as well.  She was started on naltrexone by her rheumatologist about 4 months ago, she then reduced the medication.  She has been off of it for the past 2 weeks, stopped it because she wanted to see if her facial pain has anything to do with the naltrexone.  Of note, she has also been on prednisone for the past several months since about March 2020 per Dr. Shelia Media.  She started with 5 mg strength 5 pills daily and is currently on 4 pills daily.  This was for knee pain and hip pain.  Her hand tremor fluctuates, she has a fairly consistent head tremor.  She has never been on Mysoline.  She reports that her neurosurgeon recommended follow-up for her facial pain here for consideration of any additional medical management.  She has unfortunately tried  and failed multiple medications.  She reports that they did talk about the possibility of doing a second gamma knife procedure.  She is wondering if she can take the shingles shot while on prednisone.  I am not sure that this is advisable.  She is advised to talk to her primary care physician about it.  She is advised that prednisone can exacerbate tremors at times.    The patient's allergies, current medications, family history, past medical history, past social history, past surgical history and problem list were reviewed and updated as appropriate.    Previously (copied from previous notes for reference):   I saw her on 07/14/2017, at which time she reported doing okay, she hadn't stop taking her carbamazepine as she had side effects on the 2 pill daily, felt like she was having sinus headaches and sinus congestion. She took Mucinex over-the-counter for that. She felt that half a pill of carbamazepine once daily may have helped a little bit but overall felt that her facial pain was tolerable enough to use over-the-counter Tylenol or Motrin or Advil. I suggested as needed follow-up. She called in the interim in February 2019 reporting a flareup in her facial pain and requesting to get restarted on Tegretol half a pill daily. She was advised that she could certainly restart at a low dose if helpful.    I saw her on 04/12/2017, at which time she was referred by her rheumatologist for recurrent facial  pain. She had seen dentist and endodontist for this. She reported left-sided facial pain with sensitivity to cold. 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 She had been treated for sinus infection and had undergone a CT sinuses as well. She was on a prednisone taper. She was describing a constant sharp and stabbing pain on the left side of her face. I suggested we proceed with MRI testing and a trial of carbamazepine.   She had a brain MRI with and without contrast on 05/04/2017 which I reviewed:  IMPRESSION:    Unremarkable MRI brain (with and without). 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 No acute findings.   We called her with her test results.      04/12/2017: She reports recent onset of jaw pain. She saw her dentist and also an endodontist for this. I reviewed office records from Dr. Dorothyann Peng her endodontic specialist from 02/16/2017, he suspected possible neuralgic pain. Her pain is on the left side and she is sensitive to cold but this was felt to be within normal limits for her. She saw ENT and had a CT of her sinuses. She has had left upper dental pain and left ear pain for months. I reviewed an office note from ENT from February 2018. 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 CT sinuses from 08/17/2016 showed: IMPRESSION: Normal paranasal sinuses. No acute maxillary dental finding identified.   She is on a prednisone taper, now on 1 mg daily.  She bruises easily.    She had seen her dentist first, then the ENT, then the endodontist.  She had a crown replaced in Jan 2018 in the L lower back, molar. Then had root canal to the tooth adjacent to it.  She desribes the pain as constant, sharp, stabbing, at times prickly sensation, typically starts in the upper left jaw area, radiates sometimes to the ER, sometimes with a dull pain in the ear, sometimes radiates to the left side of the nose and feels like her left upper lip starts trembling when the pain is more intense. She is able to sleep at night but feels like the pain is worse at night. She has a remote history of root canal to the left upper molars in the 80s, had some additional treatment later on.   I saw her on 07/13/2016 for her essential tremors. At that time she reported that her tremors became worse but in the most recent 3 weeks she felt a little better.  She had started low-dose prednisone 5 mg every other day after seeing a new rheumatologist. She had a bout of nausea and vomiting and significant constipation, went to the emergency room on 06/26/2016. She finished a recent course of antibiotics.   She saw Ward Givens in follow-up on 01/11/17, at which time her tremor was noted to be rather mild.   I first met her on 01/08/2016 at the request of her primary care physician, at which time she reported intermittent facial trembling and bilateral arm and hand tremors. Her history and physical exam are in keeping with mild essential tremor and I suggested we monitor her symptoms. She had recent blood work through her PCP. Her exam was otherwise nonfocal, she did give a family history of tremors in her sister.    01/08/2016: She reports intermittent facial trembling, typically first thing in  the morning, intermittently for the past months, since April or so and bilateral arm tremors and hand tremors intermittently as well. I reviewed your office note from 12/31/2015, which you kindly included. She was noted to have a slight voice tremor. She reported no other trembling elsewhere. She denied restless leg symptoms.    She reports that she was treated for the flu and UTI around Easter and was first treated with ampicillin and then with Cipro. After she stopped the antibiotics she noted weakness and trembling, affecting both upper and lower extremities and the neck and jaw.    She has knee pain b/l and has received injections into the L knee.    She has a FHx of tremors in her sister who has a tremor of her hands or right hand. Sister has trouble feeding herself d/t tremor. Patient reports that sister is several years older. Sister's husband had Parkinson's disease and patient worries a little bit about that as well. She has issues with interstitial cystitis. She has been on physical therapy for this. She sees Dr. Roni Bread for this. She is trying to drink enough  water and follow through with the exercises at home as well.   She thinks that her balance is not as good. She took a fall when she was still dealing with her flu and UTI issues. This was at home and she fell against the dresser.   Patient is a non-smoker, does not drink alcohol and has very little caffeine, in the form of jasmine tea.    Addendum, 5:30 PM: I reviewed blood test results from 11/28/2015: BMP showed creatinine of 1.2, glucose 64, otherwise unremarkable, CK level normal at 42, ESR normal at 15, lipid panel showed total cholesterol of 202, LDL 134, triglycerides 118. TSH on 04/01/2015 was 3.64. Vitamin B12 on 07/16/2014 was 501, magnesium 2.1.  Her Past Medical History Is Significant For: Past Medical History:  Diagnosis Date  . Adenomatous colon polyp 03/24/2018  . Allergy   . Arthritis   . Cataract   . Chronic headaches   . Fibromyalgia   . Fibromyalgia   . Gallstones   . GERD (gastroesophageal reflux disease)   . Hyperlipemia   . Hypertension   . Interstitial cystitis   . Lactose intolerance   . LBBB (left bundle branch block)   . Osteoarthritis   . Pneumonia   . Thyroid disease   . Tinnitus   . Trigeminal neuralgia     Her Past Surgical History Is Significant For: Past Surgical History:  Procedure Laterality Date  . CHOLECYSTECTOMY  03/2010  . COLONOSCOPY  2009   Dr. Raford Pitcher   . CYSTOSCOPY     Multiple Cystoscopies with stents or biopsy   . ESOPHAGOGASTRODUODENOSCOPY  10/2013  . Gamma knife radiation  01/2018   for trigeminal neuralgia  . TONSILLECTOMY      Her Family History Is Significant For: Family History  Problem Relation Age of Onset  . Heart disease Mother   . Heart disease Father   . Stroke Father   . Stroke Sister   . Asthma Daughter   . Colon cancer Neg Hx     Her Social History Is Significant For: Social History   Socioeconomic History  . Marital status: Married    Spouse name: Not on file  . Number of children: 2  .  Years of education: college  . Highest education level: Not on file  Occupational History  . Occupation: housewife    Employer:  RETIRED  Social Needs  . Financial resource strain: Not on file  . Food insecurity    Worry: Not on file    Inability: Not on file  . Transportation needs    Medical: Not on file    Non-medical: Not on file  Tobacco Use  . Smoking status: Never Smoker  . Smokeless tobacco: Never Used  Substance and Sexual Activity  . Alcohol use: No    Alcohol/week: 0.0 standard drinks  . Drug use: No  . Sexual activity: Not on file  Lifestyle  . Physical activity    Days per week: Not on file    Minutes per session: Not on file  . Stress: Not on file  Relationships  . Social Herbalist on phone: Not on file    Gets together: Not on file    Attends religious service: Not on file    Active member of club or organization: Not on file    Attends meetings of clubs or organizations: Not on file    Relationship status: Not on file  Other Topics Concern  . Not on file  Social History Narrative   1 cup of Jasmine tea daily     Her Allergies Are:  Allergies  Allergen Reactions  . Persantine [Dipyridamole] Other (See Comments)    Migraines   . Butrans [Buprenorphine] Other (See Comments)    "knocks me out"  . Codeine Nausea Only  . Doxycycline Rash  . Esomeprazole Other (See Comments)    Jittery, sore throat  . Irbesartan Diarrhea  . Lactose Intolerance (Gi) Other (See Comments)    GI upset   . Lactulose Other (See Comments)    GI upset  . Losartan Other (See Comments)    Hot and chills  . Maxzide [Hydrochlorothiazide W-Triamterene] Other (See Comments)    Cramps   . Micardis [Telmisartan] Other (See Comments)    headache  . Nortriptyline Hcl Other (See Comments)    "My body doesn't metabolize it anymore."  . Oxycodone Other (See Comments)    Interstitial cystitis  . Oxycontin [Oxycodone Hcl] Other (See Comments)    Interstitial  cystitis   . Pennsaid [Diclofenac Sodium] Hives  . Tramadol Itching    sedation  :   Her Current Medications Are:  Outpatient Encounter Medications as of 02/22/2019  Medication Sig  . cetirizine (ZYRTEC) 10 MG tablet Take 10 mg by mouth daily.  . Cholecalciferol (VITAMIN D-3) 5000 units TABS Take 1 tablet by mouth daily.  . Cyanocobalamin (VITAMIN B-12) 2500 MCG SUBL Place 1 tablet under the tongue daily.  . Digestive Enzymes (DIGESTIVE ENZYME ULTRA) CAPS Take 3 capsules by mouth daily.  . DULoxetine (CYMBALTA) 30 MG capsule Take 30 mg by mouth daily.   . fluticasone (FLONASE) 50 MCG/ACT nasal spray Place 2 sprays into both nostrils daily.  Marland Kitchen HYDROcodone-acetaminophen (NORCO/VICODIN) 5-325 MG tablet Take 1 tablet by mouth every 6 (six) hours as needed for moderate pain.  Marland Kitchen levothyroxine (SYNTHROID, LEVOTHROID) 75 MCG tablet Take 1 tablet by mouth daily.  . Magnesium 300 MG CAPS Take 2 capsules by mouth daily.  . pantoprazole (PROTONIX) 40 MG tablet Take 40 mg by mouth 2 (two) times daily.  Marland Kitchen PREDNISONE PO Take 4 mg by mouth daily.  Marland Kitchen PREMARIN vaginal cream INSERT I GRAM INTRAVAGINAL 2 3 TIMES WEEKLY  . UNABLE TO FIND daily. Med Name: Naltrexone 2 mg daily  . [DISCONTINUED] nortriptyline (PAMELOR) 10 MG capsule Take 10 mg by mouth  3 (three) times daily.  . [DISCONTINUED] predniSONE (DELTASONE) 5 MG tablet TAKE 1 TABLET BY MOUTH ONCE DAILY FOR 30 DAYS   No facility-administered encounter medications on file as of 02/22/2019.   :  Review of Systems:  Out of a complete 14 point review of systems, all are reviewed and negative with the exception of these symptoms as listed below:  Review of Systems  Neurological:       Pt presents today to discuss her tremors and facial pain. Pt reports that her facial pain is back after the gamma knife. She called the MD who performed the gamma knife and they told her to discuss this with Dr. Rexene Alberts. Pt's tremors in her bilateral hands and her teeth  chattering are bothering her again. She wonders if this could be medication related.    Objective:  Neurological Exam  Physical Exam Physical Examination:   Vitals:   02/22/19 1141  BP: (!) 156/75  Pulse: 72    General Examination: The patient is a very pleasant 78 y.o. female in no acute distress. She appears well-developed and well-nourished and well groomed.   HEENT:Normocephalic, atraumatic, pupils are equal, round and reactive to light and accommodation. Corrective eyeglasses in place.Extraocular tracking is good without limitation to gaze excursion or nystagmus noted. Normal smooth pursuit is noted. Hearing is grossly intact. Face is symmetric with normal facial animation, no current facial pain attack. There is no hypophonia. She has a very mild intermittent lower jaw tremor, very slight voice tremor, stable. Mild head side to side tremor. Neck is supple with full range of passive and active motion. Oropharynx exam reveals:Moderate mouth dryness noted.  Tongue protrudes centrally in palate elevates symmetrically.  Chest:Clear to auscultation without wheezing, rhonchi or crackles noted.  Heart:S1+S2+0, regular and normal without murmurs, rubs or gallops noted.   Abdomen:Soft, non-tender and non-distended with normal bowel sounds appreciated on auscultation.  Extremities:There is1+ pitting edema in the distal lower extremities bilaterally.  Skin: Warm and dry without trophic changes noted. There are no varicose veins.  Musculoskeletal: exam reveals no obvious joint deformities, tenderness or joint swelling or erythema.   Neurologically:  Mental status: The patient is awake, alert and oriented in all 4 spheres. Her immediate and remote memory, attention, language skills and fund of knowledge are appropriate. There is no evidence of aphasia, agnosia, apraxia or anomia. Speech is clear with normal prosody and enunciation. Thought process is linear. Mood is normal  and affect is normal.  Cranial nerves II - XII are as described above under HEENT exam. In addition: shoulder shrug is normal with equal shoulder height noted. Motor exam: Normal bulk, strength and tone is noted. There is no resting tremor or rebound. Reflexes are about 1+.  (On 01/08/16:Archimedes spiral drawing she has very mild tremulousness with the left hand. Handwriting is not tremulous, legible and not micrographic.)  She has a verymild postural tremor in both hands, No significant action tremor, left postural tremor slightly more noticeable than right.  Fine motor skills are intact grossly. Cerebellar testing: No dysmetria or intention tremor.  Sensory exam: intact in the upper and lower extremities.  Gait, station and balance are all normal. Age-appropriate posture. No shuffling.    Assessment and Plan:   In summary, Laiba Fuerte is a 78 year old female with an underlying medical history of osteoarthritis, allergic rhinitis, fibromyalgia, hyperlipidemia, osteopenia, left bundle branch block, tinnitus, hearing loss, hypertension, and recurrent headaches,and essential tremor, who presents forfollow-up consultation of her Essential tremor and  recurrence of left facial pain.  Unfortunately, for her left facial pain, she has not only seen multiple specialists including ENT and dentistry, as well as endodontist. She Has tried and failed multiple medications.  She had initially very good results with her gamma knife procedure in August of last year.  She had some recurrence of pain.  She has recently stopped taking naltrexone.  She was on it from her rheumatologist.  She stopped it on her own and is wondering if she should get back on it.  She can certainly try, she is advised to double check with her rheumatologist, she has an appointment pending.  She has been on prednisone since March 2020 and is advised to check with her primary care physician if she should continue with it  or slowly taper it.  She may have noticed an increase in her tremors due to being on prednisone.  I explained to her that the prednisone would not be likely to exacerbate her facial pain.At this juncture, I do not have any additional medical symptomatic treatment options for her facial pain.  Unfortunately, she has had intolerances to medications in the past, we tried carbamazepine and Trileptal and she had also been on nortriptyline in the past and is currently on Cymbalta.  She had side effects on gabapentin in the past.For her facial pain, she is advised to get in touch with her surgeon to discuss the possibility of doing a second gamma knife procedure if she is considered a good candidate for this.  Alternatively, pain management referral can be considered and she is encouraged to talk to her PCP about this.  As far as getting back on the naltrexone, she is advised to check with her rheumatologist.  As far as being able to take the shingles shot while she is on prednisone, she is advised to talk to her PCP about this.  I am not sure that it would be very effective to take the shingles shot while on prednisone.  As far as her tremor goes, I suggested low-dose Mysoline 50 mg strength with cautious increase.  She will start with half a pill at bedtime for the next 2 weeks and then increase it to 1 pill at bedtime thereafter.  We talked about expectations, side effects and limitations of this medication.  She is advised thatTypically head in midline tremors do not respond as well to symptomatic treatment as opposed to hand tremors.She is advised to follow-up with 1 of our nurse practitioners in 3 months, sooner if needed.  I answered all her questions today and she was in agreement. I spent 25 minutes in total face-to-face time with the patient, more than 50% of which was spent in counseling and coordination of care, reviewing test results, reviewing medication and discussing or reviewing the diagnosis of ET and  facial pain, the prognosis and treatment options. Pertinent laboratory and imaging test results that were available during this visit with the patient were reviewed by me and considered in my medical decision making (see chart for details).

## 2019-02-22 NOTE — Telephone Encounter (Signed)
Called and spoke with pt regarding her question about getting a flu shot. I offered her an injection appt; however, pt states she has decided to get her flu vaccine at a drive-thru injection clinic with her PCP. I let her know to give our office a call if we can help with anything else. Pt expressed understanding with no additional questions. Nothing further needed at this time.

## 2019-02-22 NOTE — Telephone Encounter (Signed)
ATC pt, there was no answer and I could not leave a message. Will try back. 

## 2019-02-28 DIAGNOSIS — M797 Fibromyalgia: Secondary | ICD-10-CM | POA: Diagnosis not present

## 2019-02-28 DIAGNOSIS — M25559 Pain in unspecified hip: Secondary | ICD-10-CM | POA: Diagnosis not present

## 2019-02-28 DIAGNOSIS — M25562 Pain in left knee: Secondary | ICD-10-CM | POA: Diagnosis not present

## 2019-02-28 DIAGNOSIS — M170 Bilateral primary osteoarthritis of knee: Secondary | ICD-10-CM | POA: Diagnosis not present

## 2019-02-28 DIAGNOSIS — M150 Primary generalized (osteo)arthritis: Secondary | ICD-10-CM | POA: Diagnosis not present

## 2019-03-03 DIAGNOSIS — Z23 Encounter for immunization: Secondary | ICD-10-CM | POA: Diagnosis not present

## 2019-03-18 ENCOUNTER — Encounter: Payer: Self-pay | Admitting: Neurology

## 2019-03-19 ENCOUNTER — Telehealth: Payer: Self-pay | Admitting: Neurology

## 2019-03-19 NOTE — Telephone Encounter (Signed)
On Thursday at 11am she took 2-4 tabs of primidone (the night before took 1/4 dose) by mistake thought it was prednisone. She vomited about 3pm Thursday. She has been very sleepy since then, she has nausea when she eats, she is still very sleepy, she has stopped taking primidone at bedtime and has not taken any since then. She just got out of bed and she is going to eat breakfast. She is feeling better she says but she is worried because it is lasting too long. She is more lethargic, she will sit up for two hours at dinnertime, she wants to sleep all the time, decreased appetite, no confusion or altered mental status, this is from husband.  Patient gets on the phone and tells me her face is numb from Gmma knife, she is oriented to person, place, date, day, month, year, president, she is a good historian, she confirms what happened taking Primidone instead of prednisone very detailed in her description and details such as doing the wash, putting clothes in the dryer. Her symptoms are consistent with side effects of Primidone drowsiness, tiredness, loss of appetite, nausea. No headache, no significant dizziness, no falls or difficulty walking. It has been 4 days and she is improving, I am not sure there is anything, just nause no abdominal pain. No confusion, o vision changes or eye movement changes,trouble breathing. It is possibly taking a long time to recover due to reported CKD (in 2018 had creatinine 1.2) but she doesn't have any symptoms of toxicity. I discussed poison control center and calling immediately if this ever happens again and they are going to call anyway.  Patient and wife would like a call back Monday Morning.

## 2019-03-20 NOTE — Telephone Encounter (Signed)
Please call patient to see if she feels better. I emailed her husband back too. See my chart message for reference.

## 2019-03-20 NOTE — Telephone Encounter (Signed)
I called pt, spoke with pt's husband per DPR. He reports that pt is doing better than she was this weekend. Pt was able to stay awake for 8 hours yesterday rather than the 2-3 hours she was awake the two days prior. Pt still feels nauseated but has not vomited recently. Pt's husband will let us know if she does not continue to improve.

## 2019-04-06 DIAGNOSIS — Z961 Presence of intraocular lens: Secondary | ICD-10-CM | POA: Diagnosis not present

## 2019-04-06 DIAGNOSIS — H5052 Exophoria: Secondary | ICD-10-CM | POA: Diagnosis not present

## 2019-04-06 DIAGNOSIS — H353131 Nonexudative age-related macular degeneration, bilateral, early dry stage: Secondary | ICD-10-CM | POA: Diagnosis not present

## 2019-04-06 DIAGNOSIS — H18413 Arcus senilis, bilateral: Secondary | ICD-10-CM | POA: Diagnosis not present

## 2019-04-13 DIAGNOSIS — F339 Major depressive disorder, recurrent, unspecified: Secondary | ICD-10-CM | POA: Diagnosis not present

## 2019-04-13 DIAGNOSIS — I10 Essential (primary) hypertension: Secondary | ICD-10-CM | POA: Diagnosis not present

## 2019-04-13 DIAGNOSIS — M797 Fibromyalgia: Secondary | ICD-10-CM | POA: Diagnosis not present

## 2019-04-25 DIAGNOSIS — R6889 Other general symptoms and signs: Secondary | ICD-10-CM | POA: Diagnosis not present

## 2019-04-25 DIAGNOSIS — R110 Nausea: Secondary | ICD-10-CM | POA: Diagnosis not present

## 2019-04-25 DIAGNOSIS — F339 Major depressive disorder, recurrent, unspecified: Secondary | ICD-10-CM | POA: Diagnosis not present

## 2019-04-28 DIAGNOSIS — Z923 Personal history of irradiation: Secondary | ICD-10-CM | POA: Diagnosis not present

## 2019-04-28 DIAGNOSIS — G500 Trigeminal neuralgia: Secondary | ICD-10-CM | POA: Diagnosis not present

## 2019-04-28 DIAGNOSIS — Z9181 History of falling: Secondary | ICD-10-CM | POA: Diagnosis not present

## 2019-04-28 DIAGNOSIS — Z886 Allergy status to analgesic agent status: Secondary | ICD-10-CM | POA: Diagnosis not present

## 2019-04-28 DIAGNOSIS — Z881 Allergy status to other antibiotic agents status: Secondary | ICD-10-CM | POA: Diagnosis not present

## 2019-04-28 DIAGNOSIS — Z888 Allergy status to other drugs, medicaments and biological substances status: Secondary | ICD-10-CM | POA: Diagnosis not present

## 2019-04-28 DIAGNOSIS — Z885 Allergy status to narcotic agent status: Secondary | ICD-10-CM | POA: Diagnosis not present

## 2019-05-08 DIAGNOSIS — E039 Hypothyroidism, unspecified: Secondary | ICD-10-CM | POA: Diagnosis not present

## 2019-05-08 DIAGNOSIS — Z23 Encounter for immunization: Secondary | ICD-10-CM | POA: Diagnosis not present

## 2019-05-17 DIAGNOSIS — R110 Nausea: Secondary | ICD-10-CM | POA: Diagnosis not present

## 2019-05-17 DIAGNOSIS — F339 Major depressive disorder, recurrent, unspecified: Secondary | ICD-10-CM | POA: Diagnosis not present

## 2019-05-24 ENCOUNTER — Encounter: Payer: Self-pay | Admitting: Family Medicine

## 2019-05-24 ENCOUNTER — Ambulatory Visit (INDEPENDENT_AMBULATORY_CARE_PROVIDER_SITE_OTHER): Payer: Medicare Other | Admitting: Family Medicine

## 2019-05-24 ENCOUNTER — Other Ambulatory Visit: Payer: Self-pay

## 2019-05-24 VITALS — BP 150/78 | HR 73 | Temp 97.3°F | Ht 63.0 in | Wt 150.0 lb

## 2019-05-24 DIAGNOSIS — G250 Essential tremor: Secondary | ICD-10-CM

## 2019-05-24 DIAGNOSIS — G518 Other disorders of facial nerve: Secondary | ICD-10-CM | POA: Diagnosis not present

## 2019-05-24 NOTE — Progress Notes (Addendum)
PATIENT: [name] DOB: Jun 22, 1941  REASON FOR VISIT: follow up HISTORY FROM: patient  Chief Complaint  Patient presents with   Follow-up    Corner room, alone.      HISTORY OF PRESENT ILLNESS: Today 05/24/19 [name] is a 78 y.o. female here today for follow up for tremor and facial pain. She is scheduled for [facility] treatment on 06/13/19 with Dr Arlan Organ. Tremor is fairly stable.She did not tolerate primidone. She felt very sleepy and groggy on this medication. Dr Shelia Media is helping to manage increased depression. Duloxetine was weaned completely and she was started on bupropion. She reports that "she felt terribly" so duloxetine was restarted. She is now taking duloxetine 29m and bupropion 1562mdaily. She does note that tremors were improved when she stopped duloxetine. Depression has improved with resuming duloxetine 6038mnd continuing bupropion. She does note concerns of dry mouth and increased blood pressures. She is concerned bupropion is contributing to this.  She has restarted naltrexone 2mg45mily and has decreased prednisone to 3mg 81mly. She has follow up with PCP tomorrow.   HISTORY: (copied from Dr AtharGuadelupe Sabin on 02/22/2019)  [name] very pleasant 78 ye51 old right-handed woman with an underlying medical history of osteoarthritis, allergic rhinitis, fibromyalgia, hyperlipidemia, osteopenia, left bundle branch block, tinnitus, hearing loss, hypertension, trigeminal neuralgia and essential tremor, who presents for follow-up consultation of her essential tremor.  The patient is unaccompanied today.  I last saw her on 11/10/2017, at which time she reported worsening facial pain.  I suggested we try her on Trileptal.  She had side effects with the Tegretol.    She called in the interim in June 2019 reporting that she felt too sleepy from the Trileptal.  [facility].  She saw Dr. LaxtoArlan Organ this.  She had gamma knife radiosurgery for L trigeminal neuralgia in the interim, on 01/21/2018 and did well.  She had a six-month follow-up appointment with Dr. LaxtoArlan Organebruary 2020 and I reviewed the note.  Today, 02/22/2019: She reports that her left facial pain came back about a month ago.  She has had some fluctuation in her tremor as well.  She was started on naltrexone by her rheumatologist about 4 months ago, she then reduced the medication.  She has been off of it for the past 2 weeks, stopped it because she wanted to see if her facial pain has anything to do with the naltrexone.  Of note, she has also been on prednisone for the past several months since about March 2020 per Dr. PharrShelia Mediae started with 5 mg strength 5 pills daily and is currently on 4 pills daily.  This was for knee pain and hip pain.  Her hand tremor fluctuates, she has a fairly consistent head tremor.  She has never been on Mysoline.  She reports that her neurosurgeon recommended follow-up for her facial pain here for consideration of any additional medical management.  She has unfortunately tried and failed multiple medications.  She reports that they did talk about the possibility of doing a second gamma knife procedure.  She is wondering if she can take the shingles shot while on prednisone.  I am not sure that this is advisable.  She is advised to talk to her primary care physician about it.  She is advised that prednisone can exacerbate tremors at times.   The patient's allergies, current  medications, family history, past medical history, past social history, past surgical history and problem list were reviewed and updated as appropriate.  Previously (copied from previous notes for reference):  I saw her on 07/14/2017, at which time she reported doing okay, she hadnt stop taking her carbamazepine as she had side effects on the 2 pill daily, felt like she was having sinus headaches and sinus congestion. She took  Mucinex over-the-counter for that. She felt that half a pill of carbamazepine once daily may have helped a little bit but overall felt that her facial pain was tolerable enough to use over-the-counter Tylenol or Motrin or Advil. I suggested as needed follow-up. She called in the interim in February 2019 reporting a flareup in her facial pain and requesting to get restarted on Tegretol half a pill daily. She was advised that she could certainly restart at a low dose if helpful.  I saw her on 04/12/2017, at which time she was referred by her rheumatologist for recurrent facial pain. She had seen dentist and endodontist for this. She reported left-sided facial pain with sensitivity to cold. 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 She had been treated for sinus infection and had undergone a CT sinuses as well. She was on a prednisone taper. She was describing a constant sharp and stabbing pain on the left side of her face. I suggested we proceed with MRI testing and a trial of carbamazepine.  She had a brain MRI with and without contrast on 05/04/2017 which I reviewed: IMPRESSION:   Unremarkable MRI brain (with and without). 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 No acute findings.  We called her with her test results.   04/12/2017: She reports recent onset of jaw pain. She saw her dentist and also an endodontist for this. I reviewed office records from Dr. Dorothyann Peng her endodontic specialist from 02/16/2017, he suspected possible neuralgic pain. Her pain is on the left side and she is sensitive to cold but this was felt to be within normal limits for her. She saw ENT and had a CT of her sinuses. She has had left upper dental pain and left ear pain for months. I reviewed an office note from ENT from February 2018. 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 CT sinuses from 08/17/2016 showed: IMPRESSION: Normal paranasal sinuses. No acute maxillary dental finding identified.  She is on a prednisone taper, now on 1 mg daily.  She bruises easily.   She had seen her dentist first, then the ENT, then the endodontist.  She had a crown replaced in Jan 2018 in the L lower back, molar. Then had root canal to the tooth adjacent to it.  She desribes the pain as constant, sharp, stabbing, at times prickly sensation, typically starts in the upper left jaw area, radiates sometimes to the ER, sometimes with a dull pain in the ear, sometimes radiates to the left side of the nose and feels like her left upper lip starts trembling when the pain is more intense. She is able to sleep at night but feels like the pain is worse at night. She has a remote history of root canal to the left upper molars in the 80s, had some additional treatment later on.  I saw her on 07/13/2016 for her essential tremors. At that time she reported that her  tremors became worse but in the most recent 3 weeks she felt a little better. She had started low-dose prednisone 5 mg every other day after seeing a new rheumatologist. She had a bout of nausea and vomiting and significant constipation, went to the emergency room on 06/26/2016. She finished a recent course of antibiotics.  She saw Megan Millikaninfollow-up on 01/11/17, at which time her tremor was noted to be rather mild.  I first met her on 01/08/2016 at the request of her primary care physician, at which time she reported intermittent facial trembling and bilateral arm and hand tremors. Her history and physical exam are in keeping with mild essential tremor and I suggested we monitor her symptoms. She had recent blood work through her PCP. Her exam was otherwise nonfocal, she did give a family history of tremors in her sister.   01/08/2016: She reports intermittent facial trembling, typically  first thing in the morning, intermittently for the past months, since April or soand bilateral arm tremors and hand tremors intermittently as well. I reviewed your office note from 12/31/2015, which you kindly included. She was noted to have a slight voice tremor. She reported no other trembling elsewhere. She denied restless leg symptoms.  She reports that she was treated for the flu and UTI around Easter and was first treated with ampicillin and then with Cipro. After she stopped the antibioticsshe noted weakness and trembling, affecting both upper and lower extremitiesand the neck and jaw.   She has knee pain b/l and has received injections into the L knee.   She has a FHx of tremors in her sister who has a tremor of her hands or right hand. Sister has trouble feeding herself d/t tremor. Patient reports that sister is several years older. Sister's husband had Parkinson's disease and patient worries a little bit about that as well. She has issues with interstitial cystitis. She has been on physical therapy for this. She sees Dr. Roni Bread for this. She is trying to drink enough water and follow through with the exercises at home as well.  She thinks that her balance is not as good. She took a fall when she was still dealing with her flu and UTI issues. This was at home and she fell against the dresser.  Patient is a non-smoker, does not drink alcohol and has very little caffeine, in the form of jasmine tea.   Addendum, 5:30 PM: I reviewed blood test results from 11/28/2015: BMP showed creatinine of 1.2, glucose 64, otherwise unremarkable, CK level normal at 42, ESR normal at 15, lipid panel showed total cholesterol of 202, LDL 134, triglycerides 118. TSH on 04/01/2015 was 3.64. Vitamin B12 on 07/16/2014 was 501, magnesium 2.1.   REVIEW OF SYSTEMS: Out of a complete 14 system review of symptoms, the patient complains only of the following symptoms, tremors, facial pain  and all other reviewed  systems are negative.  ALLERGIES: Allergies  Allergen Reactions   Persantine [Dipyridamole] Other (See Comments)    Migraines    Butrans [Buprenorphine] Other (See Comments)    "knocks me out"   Codeine Nausea Only   Doxycycline Rash   Esomeprazole Other (See Comments)    Jittery, sore throat   Irbesartan Diarrhea   Lactose Intolerance (Gi) Other (See Comments)    GI upset    Lactulose Other (See Comments)    GI upset   Losartan Other (See Comments)    Hot and chills   Maxzide [Hydrochlorothiazide W-Triamterene] Other (See Comments)  Cramps    Micardis [Telmisartan] Other (See Comments)    headache   Nortriptyline Hcl Other (See Comments)    "My body doesn't metabolize it anymore."   Oxycodone Other (See Comments)    Interstitial cystitis   Oxycontin [Oxycodone Hcl] Other (See Comments)    Interstitial cystitis    Pennsaid [Diclofenac Sodium] Hives   Tramadol Itching    sedation    HOME MEDICATIONS: Outpatient Medications Prior to Visit  Medication Sig Dispense Refill   buPROPion (WELLBUTRIN XL) 150 MG 24 hr tablet      cetirizine (ZYRTEC) 10 MG tablet Take 10 mg by mouth daily.     Cholecalciferol (VITAMIN D-3) 5000 units TABS Take 1 tablet by mouth daily.     Cyanocobalamin (VITAMIN B-12) 2500 MCG SUBL Place 1 tablet under the tongue daily.     Digestive Enzymes (DIGESTIVE ENZYME ULTRA) CAPS Take 3 capsules by mouth daily.     DULoxetine (CYMBALTA) 30 MG capsule Take 30 mg by mouth daily.      fluticasone (FLONASE) 50 MCG/ACT nasal spray Place 2 sprays into both nostrils daily.     hydrochlorothiazide (MICROZIDE) 12.5 MG capsule Take 12.5 mg by mouth daily.     Magnesium 300 MG CAPS Take 2 capsules by mouth daily.     ondansetron (ZOFRAN) 4 MG tablet      pantoprazole (PROTONIX) 40 MG tablet Take 40 mg by mouth 2 (two) times daily.     PREDNISONE PO Take 4 mg by mouth daily.     PREMARIN vaginal cream INSERT I GRAM INTRAVAGINAL 2  3 TIMES WEEKLY     UNABLE TO FIND daily. Med Name: Naltrexone 2 mg daily     HYDROcodone-acetaminophen (NORCO/VICODIN) 5-325 MG tablet Take 1 tablet by mouth every 6 (six) hours as needed for moderate pain.     levothyroxine (SYNTHROID, LEVOTHROID) 75 MCG tablet Take 1 tablet by mouth daily.     primidone (MYSOLINE) 50 MG tablet 1/2 pill each bedtime x 2 weeks, then 1 pill nightly thereafter. 30 tablet 5   No facility-administered medications prior to visit.     PAST MEDICAL HISTORY: Past Medical History:  Diagnosis Date   Adenomatous colon polyp 03/24/2018   Allergy    Arthritis    Cataract    Chronic headaches    Fibromyalgia    Fibromyalgia    Gallstones    GERD (gastroesophageal reflux disease)    Hyperlipemia    Hypertension    Interstitial cystitis    Lactose intolerance    LBBB (left bundle branch block)    Osteoarthritis    Pneumonia    Thyroid disease    Tinnitus    Trigeminal neuralgia     PAST SURGICAL HISTORY: Past Surgical History:  Procedure Laterality Date   CHOLECYSTECTOMY  03/2010   COLONOSCOPY  2009   Dr. Raford Pitcher    CYSTOSCOPY     Multiple Cystoscopies with stents or biopsy    ESOPHAGOGASTRODUODENOSCOPY  10/2013   Gamma knife radiation  01/2018   for trigeminal neuralgia   TONSILLECTOMY      FAMILY HISTORY: Family History  Problem Relation Age of Onset   Heart disease Mother    Heart disease Father    Stroke Father    Stroke Sister    Asthma Daughter    Colon cancer Neg Hx     SOCIAL HISTORY: Social History   Socioeconomic History   Marital status: Married    Spouse name: Not on file  Number of children: 2   Years of education: college   Highest education level: Not on file  Occupational History   Occupation: housewife    Employer: RETIRED  Social Designer, fashion/clothing strain: Not on file   Food insecurity    Worry: Not on file    Inability: Not on file   Transportation  needs    Medical: Not on file    Non-medical: Not on file  Tobacco Use   Smoking status: Never Smoker   Smokeless tobacco: Never Used  Substance and Sexual Activity   Alcohol use: No    Alcohol/week: 0.0 standard drinks   Drug use: No   Sexual activity: Not on file  Lifestyle   Physical activity    Days per week: Not on file    Minutes per session: Not on file   Stress: Not on file  Relationships   Social connections    Talks on phone: Not on file    Gets together: Not on file    Attends religious service: Not on file    Active member of club or organization: Not on file    Attends meetings of clubs or organizations: Not on file    Relationship status: Not on file   Intimate partner violence    Fear of current or ex partner: Not on file    Emotionally abused: Not on file    Physically abused: Not on file    Forced sexual activity: Not on file  Other Topics Concern   Not on file  Social History Narrative   1 cup of Jasmine tea daily       PHYSICAL EXAM  Vitals:   05/24/19 1131  BP: (!) 150/78  Pulse: 73  Temp: (!) 97.3 F (36.3 C)  Weight: 150 lb (68 kg)  Height: '5\' 3"'  (1.6 m)   Body mass index is 26.57 kg/m.  Generalized: Well developed, in no acute distress  Cardiology: normal rate and rhythm, no murmur noted Respiratory: clear to auscultation bilaterally  Neurological examination  Mentation: Alert oriented to time, place, history taking. Follows all commands speech and language fluent Cranial nerve II-XII: Pupils were equal round reactive to light. Extraocular movements were full, visual field were full on confrontational test.  Motor: The motor testing reveals 5 over 5 strength of all 4 extremities. Good symmetric motor tone is noted throughout. Very slight tremor noted of right hand, intermittent tremor of head  Gait and station: Gait is normal.    DIAGNOSTIC DATA (LABS, IMAGING, TESTING) - I reviewed patient records, labs, notes, testing and  imaging myself where available.  No flowsheet data found.   Lab Results  Component Value Date   WBC 6.2 07/01/2018   HGB 13.5 07/01/2018   HCT 40.5 07/01/2018   MCV 89.6 07/01/2018   PLT 207.0 07/01/2018      Component Value Date/Time   NA 140 06/26/2016 1154   K 3.6 06/26/2016 1154   CL 103 06/26/2016 1154   CO2 27 06/26/2016 1154   GLUCOSE 166 (H) 06/26/2016 1154   BUN 19 06/26/2016 1154   CREATININE 1.20 (H) 06/26/2016 1154   CALCIUM 10.4 (H) 06/26/2016 1154   PROT 7.4 06/26/2016 1154   ALBUMIN 4.5 06/26/2016 1154   AST 23 06/26/2016 1154   ALT 14 06/26/2016 1154   ALKPHOS 51 06/26/2016 1154   BILITOT 0.6 06/26/2016 1154   GFRNONAA 43 (L) 06/26/2016 1154   GFRAA 50 (L) 06/26/2016 1154   No  results found for: CHOL, HDL, LDLCALC, LDLDIRECT, TRIG, CHOLHDL No results found for: HGBA1C No results found for: VITAMINB12 Lab Results  Component Value Date   TSH 2.17 04/25/2014       ASSESSMENT AND PLAN 78 y.o. year old female  has a past medical history of Adenomatous colon polyp (03/24/2018), Allergy, Arthritis, Cataract, Chronic headaches, Fibromyalgia, Fibromyalgia, Gallstones, GERD (gastroesophageal reflux disease), Hyperlipemia, Hypertension, Interstitial cystitis, Lactose intolerance, LBBB (left bundle branch block), Osteoarthritis, Pneumonia, Thyroid disease, Tinnitus, and Trigeminal neuralgia. here with     ICD-10-CM   1. Neuralgic facial pain  G51.8   2. Essential tremor  G25.0     Edona is scheduled for second gamma knife treatment with Dr. Arlan Organ at [facility] this month.  Tremor is fairly stable at this time.  She was unable to tolerate side effects with primidone.  She has discontinued this medication.  She will continue close follow-up with primary care and rheumatology as well for depression and fibromyalgia management.  We will follow patient for worsening symptoms.  She is aware that she may call should tremor worsen.  She verbalizes  understanding and agreement with this plan.   No orders of the defined types were placed in this encounter.    No orders of the defined types were placed in this encounter.     I spent 15 minutes with the patient. 50% of this time was spent counseling and educating patient on plan of care and medications.    Debbora Presto, FNP-C 05/24/2019, 12:38 PM [facility] [address] [place], [place] [address] [phone]  I reviewed the above note and documentation by the Nurse Practitioner and agree with the history, exam, assessment and plan as outlined above. I was available for consultation. Star Age, MD, PhD [facility])

## 2019-05-24 NOTE — Patient Instructions (Signed)
  Continue close with PCP, rheumatology and [facility] neurology as directed.   Follow up with [place] for worsening symptoms.     Tremor A tremor is trembling or shaking that you cannot control. Most tremors affect the hands or arms. Tremors can also affect the head, vocal cords, face, and other parts of the body. There are many types of tremors. Common types include:  Essential tremor. These usually occur in people older than 40. It may run in families and can happen in otherwise healthy people.  Resting tremor. These occur when the muscles are at rest, such as when your hands are resting in your lap. People with Parkinson's disease often have resting tremors.  Postural tremor. These occur when you try to hold a pose, such as keeping your hands outstretched.  Kinetic tremor. These occur during purposeful movement, such as trying to touch a finger to your nose.  Task-specific tremor. These may occur when you perform certain tasks such as writing, speaking, or standing.  Psychogenic tremor. These dramatically lessen or disappear when you are distracted. They can happen in people of all ages. Some types of tremors have no known cause. Tremors can also be a symptom of nervous system problems (neurological disorders) that may occur with aging. Some tremors go away with treatment, while others do not. Follow these instructions at home: Lifestyle      Limit alcohol intake to no more than 1 drink a day for nonpregnant women and 2 drinks a day for men. One drink equals 12 oz of beer, 5 oz of wine, or 1 oz of hard liquor.  Do not use any products that contain nicotine or tobacco, such as cigarettes and e-cigarettes. If you need help quitting, ask your health care provider.  Avoid extreme heat and extreme cold.  Limit your caffeine intake, as told by your health care provider.  Try to get 8 hours of sleep each night.  Find ways to manage your stress, such as meditation or yoga. General  instructions  Take over-the-counter and prescription medicines only as told by your health care provider.  Keep all follow-up visits as told by your health care provider. This is important. Contact a health care provider if you:  Develop a tremor after starting a new medicine.  Have a tremor along with other symptoms such as: ? Numbness. ? Tingling. ? Pain. ? Weakness.  Notice that your tremor gets worse.  Notice that your tremor interferes with your day-to-day life. Summary  A tremor is trembling or shaking that you cannot control.  Most tremors affect the hands or arms.  Some types of tremors have no known cause. Others may be a symptom of nervous system problems (neurological disorders).  Make sure you discuss any tremors you have with your health care provider. This information is not intended to replace advice given to you by your health care provider. Make sure you discuss any questions you have with your health care provider. Document Released: 05/29/2002 Document Revised: 05/21/2017 Document Reviewed: 04/08/2017 Elsevier Patient Education  2020 [employer].

## 2019-05-26 ENCOUNTER — Ambulatory Visit: Payer: Medicare Other | Admitting: Internal Medicine

## 2019-05-30 DIAGNOSIS — M170 Bilateral primary osteoarthritis of knee: Secondary | ICD-10-CM | POA: Diagnosis not present

## 2019-05-30 DIAGNOSIS — M797 Fibromyalgia: Secondary | ICD-10-CM | POA: Diagnosis not present

## 2019-05-30 DIAGNOSIS — M353 Polymyalgia rheumatica: Secondary | ICD-10-CM | POA: Diagnosis not present

## 2019-05-30 DIAGNOSIS — M8580 Other specified disorders of bone density and structure, unspecified site: Secondary | ICD-10-CM | POA: Diagnosis not present

## 2019-05-30 DIAGNOSIS — M25559 Pain in unspecified hip: Secondary | ICD-10-CM | POA: Diagnosis not present

## 2019-05-30 DIAGNOSIS — M150 Primary generalized (osteo)arthritis: Secondary | ICD-10-CM | POA: Diagnosis not present

## 2019-05-30 DIAGNOSIS — M25562 Pain in left knee: Secondary | ICD-10-CM | POA: Diagnosis not present

## 2019-06-09 ENCOUNTER — Telehealth: Payer: Self-pay

## 2019-06-09 NOTE — Telephone Encounter (Signed)
Noted! Thank you

## 2019-06-09 NOTE — Telephone Encounter (Signed)
Noted.  I will submit.  Does patient want to have the injection by the end of this year?

## 2019-06-09 NOTE — Telephone Encounter (Signed)
Patients husband [name] is a patient of Dr Biomedical scientist. He was seen in office today and mentioned to Dr Marlou Sa that patient previously would get synvisc one injections by her PCP but has been advised that her insurance no longer covers this. Is there anyway you could check into this to see if we can get her approved for gel injections? She has [place] and Medicare.  Please call her to advise. [phone]

## 2019-06-09 NOTE — Telephone Encounter (Signed)
It was not indicated either way.

## 2019-06-09 NOTE — Telephone Encounter (Signed)
Submitted VOB for SynviscOne, bilateral knee. 

## 2019-06-13 DIAGNOSIS — G500 Trigeminal neuralgia: Secondary | ICD-10-CM | POA: Diagnosis not present

## 2019-06-13 IMAGING — DX DG CHEST 2V
2 series · 2 of 2 positions shown · non-contrast
Comparison: Radiographs October 03, 2015.

CLINICAL DATA: Chronic dyspnea.  Cough.

EXAM:
CHEST - 2 VIEW

[chest pa]
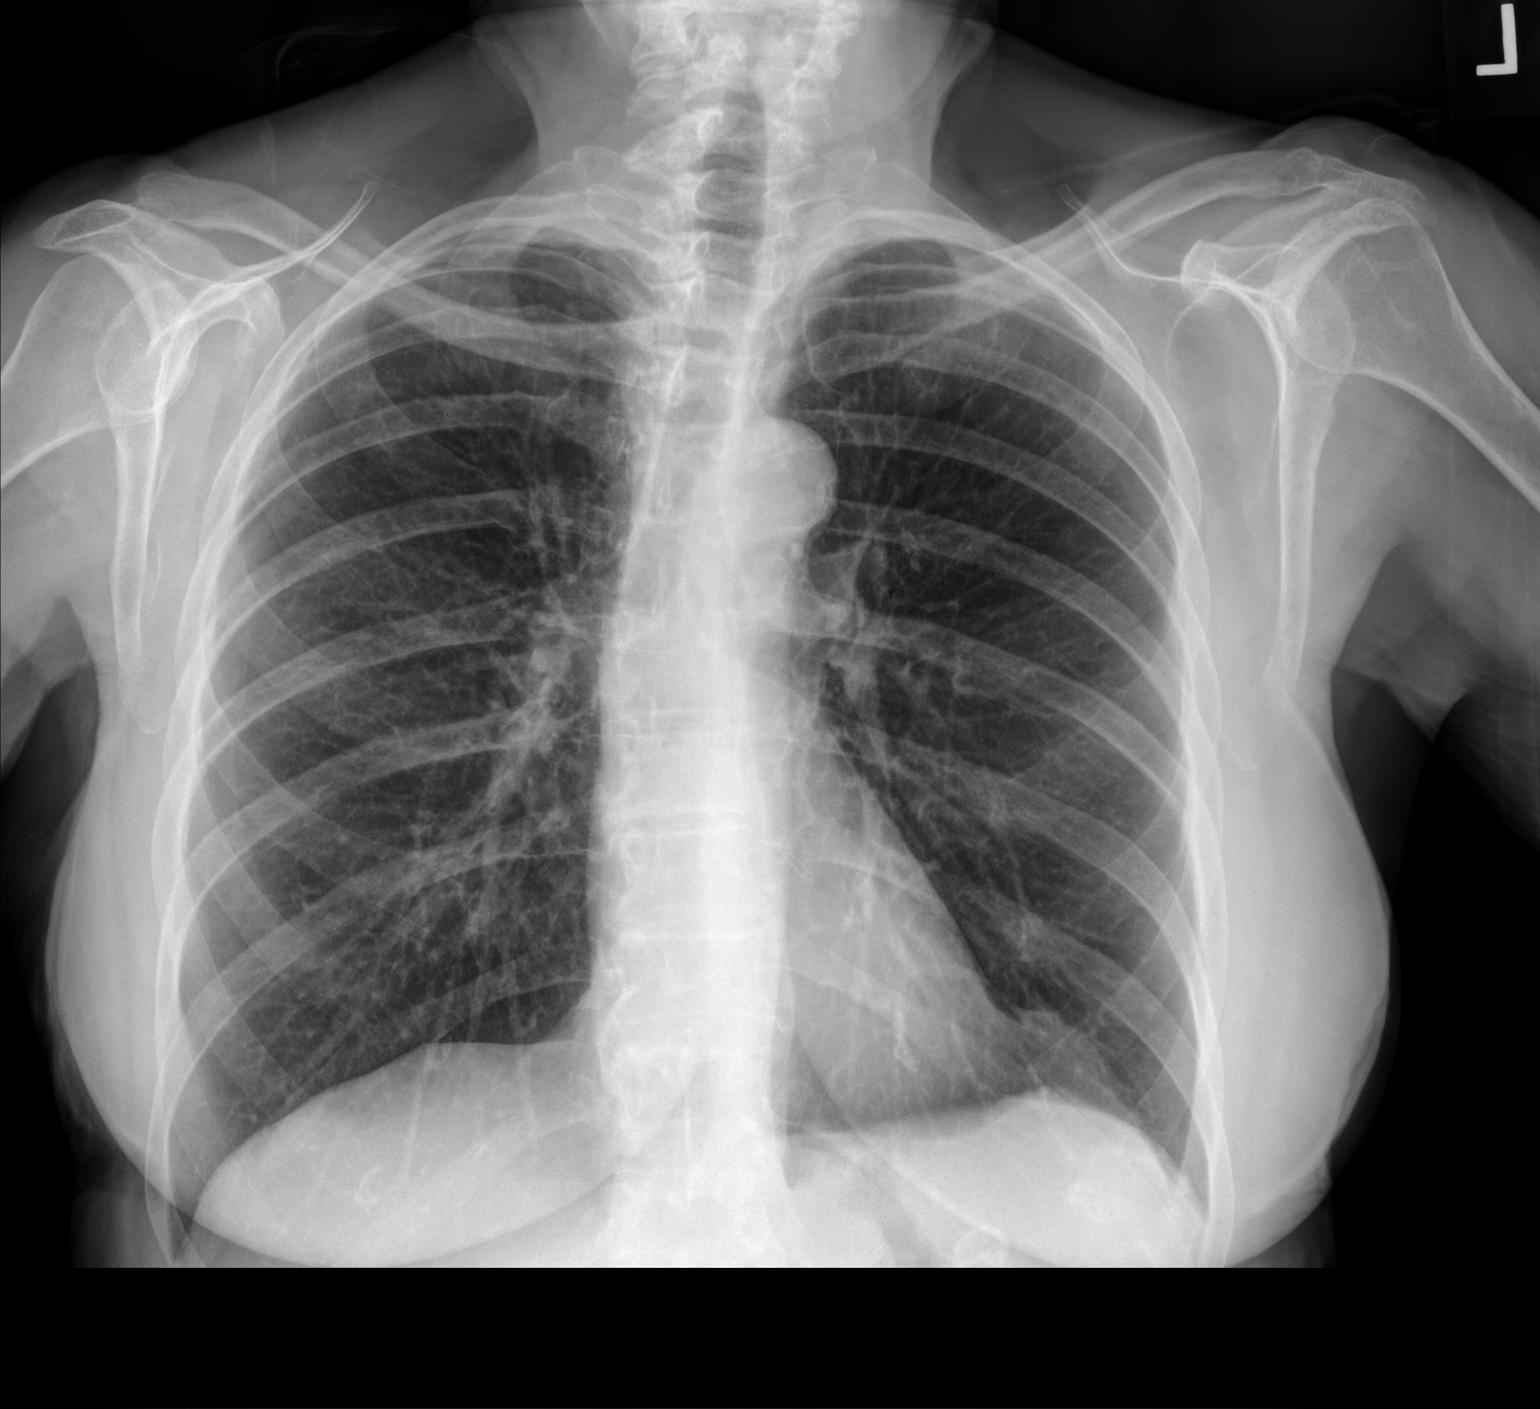

[chest lat]
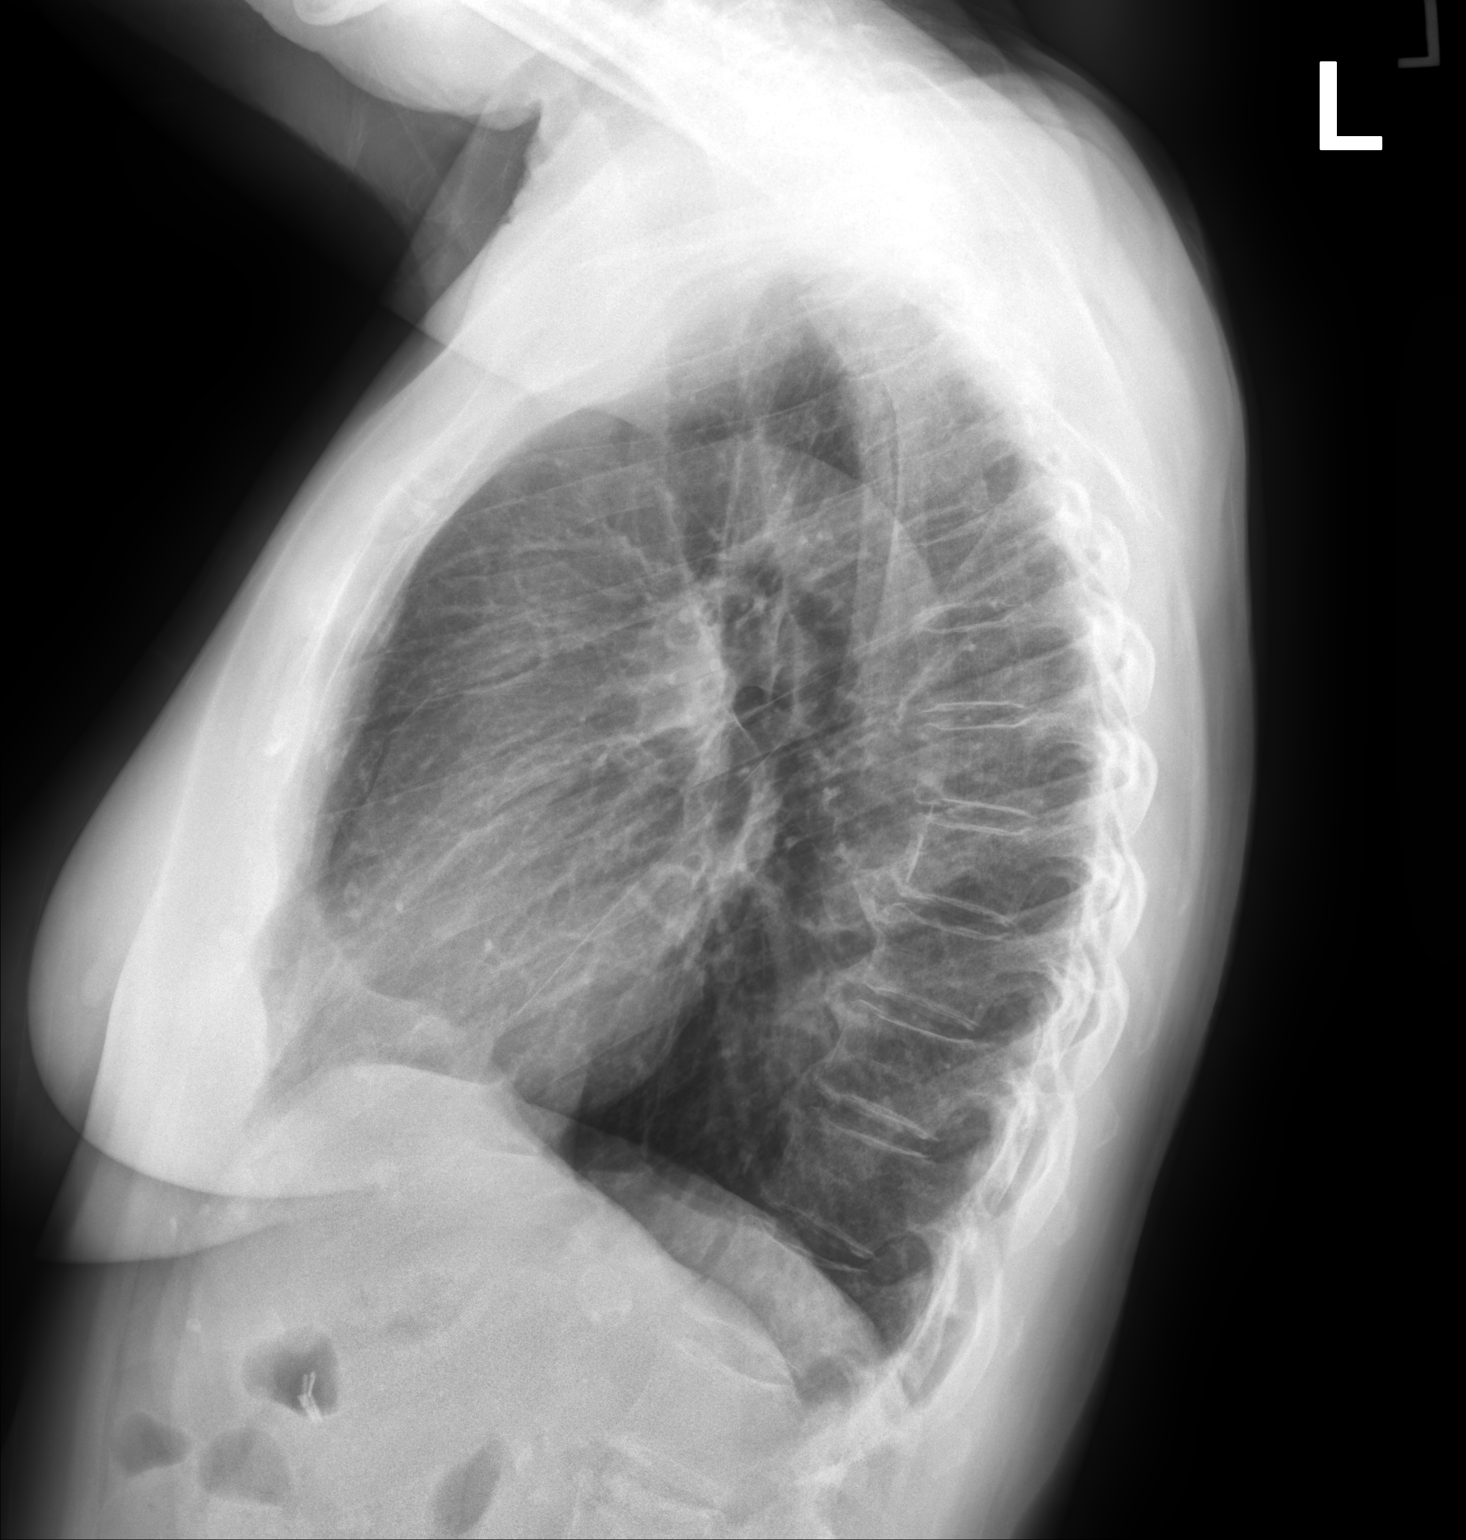

[2 of 2 positions shown; findings below may reference images not displayed]

FINDINGS: The heart size and mediastinal contours are within normal limits.
Both lungs are clear. No pneumothorax or pleural effusion is noted.
The visualized skeletal structures are unremarkable.
IMPRESSION: No active cardiopulmonary disease.

## 2019-06-19 ENCOUNTER — Encounter: Payer: Self-pay | Admitting: Neurology

## 2019-07-03 ENCOUNTER — Telehealth: Payer: Self-pay

## 2019-07-03 DIAGNOSIS — E039 Hypothyroidism, unspecified: Secondary | ICD-10-CM | POA: Diagnosis not present

## 2019-07-03 NOTE — Telephone Encounter (Signed)
Resubmitted VOB for 2021 for SynviscOne, bilateral knee.

## 2019-07-10 DIAGNOSIS — E039 Hypothyroidism, unspecified: Secondary | ICD-10-CM | POA: Diagnosis not present

## 2019-07-17 ENCOUNTER — Telehealth: Payer: Self-pay

## 2019-07-17 NOTE — Telephone Encounter (Signed)
Patient is aware that she is approved.  Approved for SynviscOne, bilateral knee. Buy & Bill Must meet Medicare deductible Covered at 100% through secondary insurance after Medicare pays  No Co-pay No PA required  Appt. 08/16/2019 with Dr. Marlou Sa

## 2019-08-02 DIAGNOSIS — N1831 Chronic kidney disease, stage 3a: Secondary | ICD-10-CM | POA: Diagnosis not present

## 2019-08-02 DIAGNOSIS — N183 Chronic kidney disease, stage 3 unspecified: Secondary | ICD-10-CM | POA: Diagnosis not present

## 2019-08-02 DIAGNOSIS — I10 Essential (primary) hypertension: Secondary | ICD-10-CM | POA: Diagnosis not present

## 2019-08-16 ENCOUNTER — Ambulatory Visit (INDEPENDENT_AMBULATORY_CARE_PROVIDER_SITE_OTHER): Payer: Medicare Other | Admitting: Orthopedic Surgery

## 2019-08-16 ENCOUNTER — Other Ambulatory Visit: Payer: Self-pay

## 2019-08-16 DIAGNOSIS — M170 Bilateral primary osteoarthritis of knee: Secondary | ICD-10-CM

## 2019-08-19 ENCOUNTER — Encounter: Payer: Self-pay | Admitting: Orthopedic Surgery

## 2019-08-19 DIAGNOSIS — M170 Bilateral primary osteoarthritis of knee: Secondary | ICD-10-CM

## 2019-08-19 MED ORDER — LIDOCAINE HCL 1 % IJ SOLN
5.0000 mL | INTRAMUSCULAR | Status: AC | PRN
  Administered 2019-08-19: 5 mL

## 2019-08-19 MED ORDER — HYLAN G-F 20 48 MG/6ML IX SOSY
48.0000 mg | PREFILLED_SYRINGE | INTRA_ARTICULAR | Status: AC | PRN
  Administered 2019-08-19: 48 mg via INTRA_ARTICULAR

## 2019-08-19 NOTE — Progress Notes (Signed)
Office Visit Note   Patient: [name]           Date of Birth: [birth year]           MRN: [id number] Visit Date: 08/16/2019 Requested by: Deland Pretty, MD [place],  [place] [address] PCP: Deland Pretty, MD  Subjective: Chief Complaint  Patient presents with  . Right Knee - Pain  . Left Knee - Pain    HPI: [name] is a patient with bilateral knee arthritis.  She has a history of having Synvisc 1 injections done by her rheumatologist.  That is no longer possible.  She presents now for bilateral Synvisc 1 injections.  She is having mild symptoms in the knees but it is relieved with the shots.  She wants to play tennis in the spring.  She does see a rheumatologist for other medical management of her arthritis and fibromyalgia.              ROS: All systems reviewed are negative as they relate to the chief complaint within the history of present illness.  Patient denies  fevers or chills.   Assessment & Plan: Visit Diagnoses:  1. Primary osteoarthritis of both knees     Plan: Impression is bilateral knee arthritis and fibromyalgia.  Plan is bilateral knee Synvisc 1 injections.  We will try to get in about 6 months.  Continue with nonweightbearing quad strengthening exercises and follow-up in 6 months.  Follow-Up Instructions: Return in about 6 months (around 02/13/2020).   Orders:  No orders of the defined types were placed in this encounter.  No orders of the defined types were placed in this encounter.     Procedures: Large Joint Inj: bilateral knee on 08/19/2019 7:41 AM Indications: pain, joint swelling and diagnostic evaluation Details: 18 G 1.5 in needle, superolateral approach  Arthrogram: No  Medications (Right): 5 mL lidocaine 1 %; 48 mg Hylan 48 MG/6ML Medications (Left): 5 mL lidocaine 1 %; 48 mg Hylan 48 MG/6ML Outcome: tolerated well, no immediate complications Procedure, treatment alternatives, risks and benefits explained,  specific risks discussed. Consent was given by the patient. Immediately prior to procedure a time out was called to verify the correct patient, procedure, equipment, support staff and site/side marked as required. Patient was prepped and draped in the usual sterile fashion.       Clinical Data: No additional findings.  Objective: Vital Signs: There were no vitals taken for this visit.  Physical Exam:   Constitutional: Patient appears well-developed HEENT:  Head: Normocephalic Eyes:EOM are normal Neck: Normal range of motion Cardiovascular: Normal rate Pulmonary/chest: Effort normal Neurologic: Patient is alert Skin: Skin is warm Psychiatric: Patient has normal mood and affect    Ortho Exam: Ortho exam demonstrates no real effusion in either knee.  Patient has good range of motion with a minimal flexion contracture and flexion easily past 90 degrees.  Extensor mechanism is intact.  Collateral and cruciate ligaments are stable.  No masses lymphadenopathy or skin changes noted in that left knee or right knee region.  Specialty Comments:  No specialty comments available.  Imaging: No results found.   PMFS History: Patient Active Problem List   Diagnosis Date Noted  . Decreased diffusion capacity 08/21/2018  . Trigeminal neuralgia   . Lumbar back pain with radiculopathy affecting right lower extremity 01/08/2014  . Dizzinesses 01/08/2014   Past Medical History:  Diagnosis Date  . Adenomatous colon polyp 03/24/2018  . Allergy   .  Arthritis   . Cataract   . Chronic headaches   . Depression   . Fibromyalgia   . Gallstones   . GERD (gastroesophageal reflux disease)   . Hyperlipemia   . Hypertension   . Interstitial cystitis   . Lactose intolerance   . LBBB (left bundle branch block)   . Osteoarthritis   . Osteopenia   . Pneumonia   . Thyroid disease   . Tinnitus   . Trigeminal neuralgia     Family History  Problem Relation Age of Onset  . Heart disease  Mother   . Heart disease Father   . Stroke Father   . Stroke Sister   . Asthma Daughter   . Colon cancer Neg Hx     Past Surgical History:  Procedure Laterality Date  . CHOLECYSTECTOMY  03/2010  . COLONOSCOPY  2009   Dr. Raford Pitcher   . CYSTOSCOPY     Multiple Cystoscopies with stents or biopsy   . ESOPHAGOGASTRODUODENOSCOPY  10/2013  . Gamma knife radiation  01/2018   for trigeminal neuralgia  . TONSILLECTOMY     Social History   Occupational History  . Occupation: housewife    Employer: RETIRED  Tobacco Use  . Smoking status: Never Smoker  . Smokeless tobacco: Never Used  Substance and Sexual Activity  . Alcohol use: No    Alcohol/week: 0.0 standard drinks  . Drug use: No  . Sexual activity: Not on file

## 2019-08-21 DIAGNOSIS — D225 Melanocytic nevi of trunk: Secondary | ICD-10-CM | POA: Diagnosis not present

## 2019-08-21 DIAGNOSIS — L821 Other seborrheic keratosis: Secondary | ICD-10-CM | POA: Diagnosis not present

## 2019-08-21 DIAGNOSIS — L918 Other hypertrophic disorders of the skin: Secondary | ICD-10-CM | POA: Diagnosis not present

## 2019-08-21 DIAGNOSIS — D0471 Carcinoma in situ of skin of right lower limb, including hip: Secondary | ICD-10-CM | POA: Diagnosis not present

## 2019-08-21 DIAGNOSIS — D2262 Melanocytic nevi of left upper limb, including shoulder: Secondary | ICD-10-CM | POA: Diagnosis not present

## 2019-08-21 DIAGNOSIS — D485 Neoplasm of uncertain behavior of skin: Secondary | ICD-10-CM | POA: Diagnosis not present

## 2019-08-21 DIAGNOSIS — D1801 Hemangioma of skin and subcutaneous tissue: Secondary | ICD-10-CM | POA: Diagnosis not present

## 2019-08-21 DIAGNOSIS — L814 Other melanin hyperpigmentation: Secondary | ICD-10-CM | POA: Diagnosis not present

## 2019-08-21 NOTE — Progress Notes (Signed)
noted 

## 2019-08-30 DIAGNOSIS — D0471 Carcinoma in situ of skin of right lower limb, including hip: Secondary | ICD-10-CM | POA: Diagnosis not present

## 2019-09-07 DIAGNOSIS — E039 Hypothyroidism, unspecified: Secondary | ICD-10-CM | POA: Diagnosis not present

## 2019-09-28 DIAGNOSIS — N3946 Mixed incontinence: Secondary | ICD-10-CM | POA: Diagnosis not present

## 2019-09-28 DIAGNOSIS — N3010 Interstitial cystitis (chronic) without hematuria: Secondary | ICD-10-CM | POA: Diagnosis not present

## 2019-09-28 DIAGNOSIS — Z87440 Personal history of urinary (tract) infections: Secondary | ICD-10-CM | POA: Diagnosis not present

## 2019-10-09 DIAGNOSIS — M170 Bilateral primary osteoarthritis of knee: Secondary | ICD-10-CM | POA: Diagnosis not present

## 2019-10-09 DIAGNOSIS — M150 Primary generalized (osteo)arthritis: Secondary | ICD-10-CM | POA: Diagnosis not present

## 2019-10-09 DIAGNOSIS — M25562 Pain in left knee: Secondary | ICD-10-CM | POA: Diagnosis not present

## 2019-10-09 DIAGNOSIS — R252 Cramp and spasm: Secondary | ICD-10-CM | POA: Diagnosis not present

## 2019-10-09 DIAGNOSIS — M25559 Pain in unspecified hip: Secondary | ICD-10-CM | POA: Diagnosis not present

## 2019-10-09 DIAGNOSIS — M353 Polymyalgia rheumatica: Secondary | ICD-10-CM | POA: Diagnosis not present

## 2019-10-09 DIAGNOSIS — M797 Fibromyalgia: Secondary | ICD-10-CM | POA: Diagnosis not present

## 2019-10-09 DIAGNOSIS — M8580 Other specified disorders of bone density and structure, unspecified site: Secondary | ICD-10-CM | POA: Diagnosis not present

## 2019-10-11 ENCOUNTER — Telehealth: Payer: Self-pay | Admitting: Family Medicine

## 2019-10-11 DIAGNOSIS — Z923 Personal history of irradiation: Secondary | ICD-10-CM | POA: Diagnosis not present

## 2019-10-11 DIAGNOSIS — G500 Trigeminal neuralgia: Secondary | ICD-10-CM | POA: Diagnosis not present

## 2019-10-11 DIAGNOSIS — Z48811 Encounter for surgical aftercare following surgery on the nervous system: Secondary | ICD-10-CM | POA: Diagnosis not present

## 2019-10-11 NOTE — Telephone Encounter (Signed)
We can certainly try gabapentin.  We could also try Lyrica but it can also have similar side effects as the gabapentin.  Beyond this, I am not quite sure what else we would offer her.  If she has not been on Cymbalta, that may be an option as well.  Ultimately, she may need a pain management referral.  Amy, please discuss trial of gabapentin for tomorrow.

## 2019-10-11 NOTE — Telephone Encounter (Signed)
Pt called requesting a soon apt to discuss being prescribed a medication for her trigeminal neuralgia pain. Pt would like to schedule next available with MD or NP

## 2019-10-11 NOTE — Telephone Encounter (Signed)
I spoke with the pt. She had gamma knife procedure on 06/19/2019. It did not help. Pain worse than prior. She had an appt with Dr. Arlan Organ today. He advised pt to return in 2 months and they would discuss repeat gamma knife vs surgery. In the meantime she stated he wanted her to return to our office for medication trial. Pt reports Dr. Arlan Organ does not prescribe medication (will prescribe only for post-surgical pain management). Pt reported she took a DNA test that showed she could not metabolize the oxcarbazepine. She also tried carbamazepine previously. Pt stated she tried Gabapentin in the past for her back but it made her feel like she would fall to the L. She is willing to try it for the TGN pain again and stated this has been discussed previously with provider. I scheduled pt for a visit with Amy NP tomorrow 4/22 @ 1:00 pm arrival 30 minutes prior. Pt verbalized appreciation for the call.

## 2019-10-12 ENCOUNTER — Encounter: Payer: Self-pay | Admitting: Family Medicine

## 2019-10-12 ENCOUNTER — Ambulatory Visit (INDEPENDENT_AMBULATORY_CARE_PROVIDER_SITE_OTHER): Payer: Medicare Other | Admitting: Family Medicine

## 2019-10-12 ENCOUNTER — Other Ambulatory Visit: Payer: Self-pay

## 2019-10-12 VITALS — BP 141/79 | HR 79 | Temp 97.5°F | Ht 63.0 in | Wt 145.0 lb

## 2019-10-12 DIAGNOSIS — G500 Trigeminal neuralgia: Secondary | ICD-10-CM

## 2019-10-12 DIAGNOSIS — G250 Essential tremor: Secondary | ICD-10-CM

## 2019-10-12 MED ORDER — PREDNISONE 5 MG PO TABS: ORAL_TABLET | ORAL | 0 refills | Status: DC

## 2019-10-12 MED ORDER — GABAPENTIN 300 MG PO CAPS: 300.0000 mg | ORAL_CAPSULE | Freq: Two times a day (BID) | ORAL | 11 refills | Status: DC

## 2019-10-12 NOTE — Patient Instructions (Addendum)
We will try gabapentin. Start with 300mg  daily for about a week. May increase to 300mg  twice daily if well tolerated and effective.   We will also start a prednisone taper for acute management. Take 5 tablets (25mg ) on day 1, take 4 tablets (20mg ) on day 2, take 3 tablets (15mg ) on day 3, taken 2 tablets (10mg ) on day 4 and take 1 tablet (5mg ) on day 5. Hold daily dose of prednisone 3mg  while on taper pack. May resume 3mg  daily on day 6.   Stay well hydrated. Well balanced diet and regular exercise.   Follow up in 3-6 months    Gabapentin capsules or tablets What is this medicine? GABAPENTIN (GA ba pen tin) is used to control seizures in certain types of epilepsy. It is also used to treat certain types of nerve pain. This medicine may be used for other purposes; ask your health care provider or pharmacist if you have questions. COMMON BRAND NAME(S): Active-PAC with Gabapentin, Gabarone, Neurontin What should I tell my health care provider before I take this medicine? They need to know if you have any of these conditions:  history of drug abuse or alcohol abuse problem  kidney disease  lung or breathing disease  suicidal thoughts, plans, or attempt; a previous suicide attempt by you or a family member  an unusual or allergic reaction to gabapentin, other medicines, foods, dyes, or preservatives  pregnant or trying to get pregnant  breast-feeding How should I use this medicine? Take this medicine by mouth with a glass of water. Follow the directions on the prescription label. You can take it with or without food. If it upsets your stomach, take it with food. Take your medicine at regular intervals. Do not take it more often than directed. Do not stop taking except on your doctor's advice. If you are directed to break the 600 or 800 mg tablets in half as part of your dose, the extra half tablet should be used for the next dose. If you have not used the extra half tablet within 28 days,  it should be thrown away. A special MedGuide will be given to you by the pharmacist with each prescription and refill. Be sure to read this information carefully each time. Talk to your pediatrician regarding the use of this medicine in children. While this drug may be prescribed for children as young as 3 years for selected conditions, precautions do apply. Overdosage: If you think you have taken too much of this medicine contact a poison control center or emergency room at once. NOTE: This medicine is only for you. Do not share this medicine with others. What if I miss a dose? If you miss a dose, take it as soon as you can. If it is almost time for your next dose, take only that dose. Do not take double or extra doses. What may interact with this medicine? This medicine may interact with the following medications:  alcohol  antihistamines for allergy, cough, and cold  certain medicines for anxiety or sleep  certain medicines for depression like amitriptyline, fluoxetine, sertraline  certain medicines for seizures like phenobarbital, primidone  certain medicines for stomach problems  general anesthetics like halothane, isoflurane, methoxyflurane, propofol  local anesthetics like lidocaine, pramoxine, tetracaine  medicines that relax muscles for surgery  narcotic medicines for pain  phenothiazines like chlorpromazine, mesoridazine, prochlorperazine, thioridazine This list may not describe all possible interactions. Give your health care provider a list of all the medicines, herbs, non-prescription drugs,  or dietary supplements you use. Also tell them if you smoke, drink alcohol, or use illegal drugs. Some items may interact with your medicine. What should I watch for while using this medicine? Visit your doctor or health care provider for regular checks on your progress. You may want to keep a record at home of how you feel your condition is responding to treatment. You may want to  share this information with your doctor or health care provider at each visit. You should contact your doctor or health care provider if your seizures get worse or if you have any new types of seizures. Do not stop taking this medicine or any of your seizure medicines unless instructed by your doctor or health care provider. Stopping your medicine suddenly can increase your seizures or their severity. This medicine may cause serious skin reactions. They can happen weeks to months after starting the medicine. Contact your health care provider right away if you notice fevers or flu-like symptoms with a rash. The rash may be red or purple and then turn into blisters or peeling of the skin. Or, you might notice a red rash with swelling of the face, lips or lymph nodes in your neck or under your arms. Wear a medical identification bracelet or chain if you are taking this medicine for seizures, and carry a card that lists all your medications. You may get drowsy, dizzy, or have blurred vision. Do not drive, use machinery, or do anything that needs mental alertness until you know how this medicine affects you. To reduce dizzy or fainting spells, do not sit or stand up quickly, especially if you are an older patient. Alcohol can increase drowsiness and dizziness. Avoid alcoholic drinks. Your mouth may get dry. Chewing sugarless gum or sucking hard candy, and drinking plenty of water will help. The use of this medicine may increase the chance of suicidal thoughts or actions. Pay special attention to how you are responding while on this medicine. Any worsening of mood, or thoughts of suicide or dying should be reported to your health care provider right away. Women who become pregnant while using this medicine may enroll in the [place] Pregnancy Registry by calling [phone]. This registry collects information about the safety of antiepileptic drug use during pregnancy. What side effects  may I notice from receiving this medicine? Side effects that you should report to your doctor or health care professional as soon as possible:  allergic reactions like skin rash, itching or hives, swelling of the face, lips, or tongue  breathing problems  rash, fever, and swollen lymph nodes  redness, blistering, peeling or loosening of the skin, including inside the mouth  suicidal thoughts, mood changes Side effects that usually do not require medical attention (report to your doctor or health care professional if they continue or are bothersome):  dizziness  drowsiness  headache  nausea, vomiting  swelling of ankles, feet, hands  tiredness This list may not describe all possible side effects. Call your doctor for medical advice about side effects. You may report side effects to FDA at 1-800-FDA-1088. Where should I keep my medicine? Keep out of reach of children. This medicine may cause accidental overdose and death if it taken by other adults, children, or pets. Mix any unused medicine with a substance like cat litter or coffee grounds. Then throw the medicine away in a sealed container like a sealed bag or a coffee can with a lid. Do not use the medicine after  the expiration date. Store at room temperature between 15 and 30 degrees C (59 and 86 degrees F). NOTE: This sheet is a summary. It may not cover all possible information. If you have questions about this medicine, talk to your doctor, pharmacist, or health care provider.  2020 Elsevier/Gold Standard (2018-09-09 14:16:43)    Trigeminal Neuralgia  Trigeminal neuralgia is a nerve disorder that causes severe pain on one side of the face. The pain may last from a few seconds to several minutes. The pain is usually only on one side of the face. Symptoms may occur for days, weeks, or months and then go away for months or years. The pain may return and be worse than before. What are the causes? This condition is caused by  damage or pressure to a nerve in the head that is called the trigeminal nerve. An attack can be triggered by:  Talking.  Chewing.  Putting on makeup.  Washing your face.  Shaving your face.  Brushing your teeth.  Touching your face. What increases the risk? You are more likely to develop this condition if you:  Are 77 years of age or older.  Are female. What are the signs or symptoms? The main symptom of this condition is severe pain in the:  Jaw.  Lips.  Eyes.  Nose.  Scalp.  Forehead.  Face. The pain may be:  Intense.  Stabbing.  Electric.  Shock-like. How is this diagnosed? This condition is diagnosed with a physical exam. A CT scan or an MRI may be done to rule out other conditions that can cause facial pain. How is this treated? This condition may be treated with:  Avoiding the things that trigger your symptoms.  Taking prescription medicines (anticonvulsants).  Having surgery. This may be done in severe cases if other medical treatment does not provide relief.  Having procedures such as ablation, thermal, or radiation therapy. It may take up to one month for treatment to start relieving the pain. Follow these instructions at home: Managing pain  Learn as much as you can about how to manage your pain. Ask your health care provider if a pain specialist would be helpful.  Consider talking with a mental health care provider (psychologist) about how to cope with the pain.  Consider joining a pain support group. General instructions  Take over-the-counter and prescription medicines only as told by your health care provider.  Avoid the things that trigger your symptoms. It may help to: ? Chew on the unaffected side of your mouth. ? Avoid touching your face. ? Avoid blasts of hot or cold air.  Follow your treatment plan as told by your health care provider. This may include: ? Cognitive or behavioral therapy. ? Gentle, regular  exercise. ? Meditation or yoga. ? Aromatherapy.  Keep all follow-up visits as told by your health care provider. You may need to be monitored closely to make sure treatment is working well for you. Where to find more information  Facial Pain Association: fpa-support.org Contact a health care provider if:  Your medicine is not helping your symptoms.  You have side effects from the medicine used for treatment.  You develop new, unexplained symptoms, such as: ? Double vision. ? Facial weakness. ? Facial numbness. ? Changes in hearing or balance.  You feel depressed. Get help right away if:  Your pain is severe and is not getting better.  You develop suicidal thoughts. If you ever feel like you may hurt yourself or others, or  have thoughts about taking your own life, get help right away. You can go to your nearest emergency department or call:  Your local emergency services (911 in the [place]  A suicide crisis helpline, such as the [place] at 781-533-8614. This is open 24 hours a day. Summary  Trigeminal neuralgia is a nerve disorder that causes severe pain on one side of the face. The pain may last from a few seconds to several minutes.  This condition is caused by damage or pressure to a nerve in the head that is called the trigeminal nerve.  Treatment may include avoiding the things that trigger your symptoms, taking medicines, or having surgery or procedures. It may take up to one month for treatment to start relieving the pain.  Avoid the things that trigger your symptoms.  Keep all follow-up visits as told by your health care provider. You may need to be monitored closely to make sure treatment is working well for you. This information is not intended to replace advice given to you by your health care provider. Make sure you discuss any questions you have with your health care provider. Document Revised: 04/25/2018 Document Reviewed:  04/25/2018 Elsevier Patient Education  [place].

## 2019-10-12 NOTE — Progress Notes (Addendum)
PATIENT: [name] DOB: 21-Mar-1941  REASON FOR VISIT: follow up HISTORY FROM: patient  Chief Complaint  Patient presents with  . Follow-up    Neuralgic facial pain, alone, rm 6     HISTORY OF PRESENT ILLNESS: Today 10/12/19 [name] is a 79 y.o. female here today for follow up for trigeminal neuralgia and tremor. She is s/p gamma knife in 05/2019. She reports that this made her trigeminal nerve pain worse. She is having daily sharp stabbing pain. She has right sided numbness. She feels unusual sensations on the top of her head. She has pain of the left eye. She was seen by ophthalmology and reports a normal exam. She feels that tremor in her arms are better. She has noticed more tremor of her lips and feels that her teeth chatter. She is concerned that this is related to facial pain. She has been on Tegretol and trileptal in the past were ineffective. She reports having DNA testing and was told that carbamazepine will not work with her. She has tried nortriptyline that did not help and caused dry mouth. She has tried gabapentin in the past for back pain. She tolerated it well but did report "walking to the left". She is not sure that this was related to medication. She is taking duloxetine 52m daily (weaned from 969mabout a year ago). She is taking prednisone 36m50maily for fibro and osteoarthritis. She used to play tennis but has not been as active this past year.   HISTORY: (copied from my note on 05/24/2019)  [name] a [age]o. female here today for follow up for tremor and facial pain. [facility] with Dr LaxArlan Organremor is fairly stable.She did not tolerate primidone. She felt very sleepy and groggy on this medication. Dr PhaShelia Media helping to manage increased depression. Duloxetine was weaned completely and she was started on bupropion. She reports that "she felt terribly" so duloxetine was restarted. She is now taking  duloxetine 43m29md bupropion 150mg80mly. She does note that tremors were improved when she stopped duloxetine. Depression has improved with resuming duloxetine 43mg 61mcontinuing bupropion. She does note concerns of dry mouth and increased blood pressures. She is concerned bupropion is contributing to this.  She has restarted naltrexone 2mg da34m and has decreased prednisone to 36mg dai43m She has follow up with PCP tomorrow.   HISTORY: (copied from Dr Athar's [place] Sabin 02/22/2019)  [name] pleasant 78 year 88d right-handed woman with an underlying medical history of osteoarthritis, allergic rhinitis, fibromyalgia, hyperlipidemia, osteopenia, left bundle branch block, tinnitus, hearing loss, hypertension, trigeminal neuralgia and essential tremor, who presents for follow-up consultation of her essential tremor. The patient is unaccompanied today. I last saw her on 11/10/2017, at which time she reported worsening facial pain. I suggested we try her on Trileptal. She had side effects with the Tegretol.   She called in the interim in June 2019 reporting that she felt too sleepy from the Trileptal. [facility] She saw Dr. Laxton fArlan Organs. She had gamma knife radiosurgery for L trigeminal neuralgia in the interim, on 01/21/2018 and did well. She had a six-month follow-up appointment with Dr. Laxton iArlan Organuary 2020 and I reviewed the note.  Today, 02/22/2019: She reports that her left facial pain came back about a month ago. She has had some fluctuation in her  tremor as well. She was started on naltrexone by her rheumatologist about 4 months ago, she then reduced the medication. She has been off of it for the past 2 weeks, stopped it because she wanted to see if her facial pain has anything to do with the naltrexone. Of note, she has also been on prednisone for the past several months since about March 2020 per Dr. Shelia Media.  She started with 5 mg strength 5 pills daily and is currently on 4 pills daily. This was for knee pain and hip pain. Her hand tremor fluctuates, she has a fairly consistent head tremor. She has never been on Mysoline. She reports that her neurosurgeon recommended follow-up for her facial pain here for consideration of any additional medical management. She has unfortunately tried and failed multiple medications. She reports that they did talk about the possibility of doing a second gamma knife procedure. She is wondering if she can take the shingles shot while on prednisone. I am not sure that this is advisable. She is advised to talk to her primary care physician about it. She is advised that prednisone can exacerbate tremors at times.   The patient's allergies, current medications, family history, past medical history, past social history, past surgical history and problem list were reviewed and updated as appropriate.  Previously (copied from previous notes for reference):  I saw her on 07/14/2017, at which time she reported doing okay, she hadn't stop taking her carbamazepine as she had side effects on the 2 pill daily, felt like she was having sinus headaches and sinus congestion. She took Mucinex over-the-counter for that. She felt that half a pill of carbamazepine once daily may have helped a little bit but overall felt that her facial pain was tolerable enough to use over-the-counter Tylenol or Motrin or Advil. I suggested as needed follow-up. She called in the interim in February 2019 reporting a flareup in her facial pain and requesting to get restarted on Tegretol half a pill daily. She was advised that she could certainly restart at a low dose if helpful.  I saw her on 04/12/2017, at which time she was referred by her rheumatologist for recurrent facial pain. She had seen dentist and endodontist for this. She reported left-sided facial pain with sensitivity to cold. 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 She had been treated for sinus infection and had undergone a CT sinuses as well. She was on a prednisone taper. She was describing a constant sharp and stabbing pain on the left side of her face. I suggested we proceed with MRI testing and a trial of carbamazepine.  She had a brain MRI with and without contrast on 05/04/2017 which I reviewed: IMPRESSION:   Unremarkable MRI brain (with and without). 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 No acute findings.  We called her with her test results.   04/12/2017: She reports recent onset of jaw pain. She saw her dentist and also an endodontist for this. I reviewed office records from Dr. Dorothyann Peng her endodontic specialist from 02/16/2017, he suspected possible neuralgic pain. Her pain is on the left side and she is sensitive to cold but this was felt to be within normal limits for her. She saw ENT and had a CT of her sinuses. She has had left upper dental pain and left ear pain for months. I reviewed an office note from ENT from February 2018. She had been using a bite  guard for TMJ dysfunction, underwent dental x-rays, has also been treated for sinus infection with clindamycin for 10 days. CT sinuses from 08/17/2016 showed: IMPRESSION: Normal paranasal sinuses. No acute maxillary dental finding identified.  She is on a prednisone taper, now on 1 mg daily.  She bruises easily.   She had seen her dentist first, then the ENT, then the endodontist.  She had a crown replaced in Jan 2018 in the L lower back, molar. Then had root canal to the tooth adjacent to it.  She desribes the pain as constant, sharp, stabbing, at times prickly sensation, typically starts in the upper left jaw area, radiates sometimes to the ER, sometimes with a dull pain in the ear, sometimes radiates to the left side of the nose and feels like her left upper lip starts  trembling when the pain is more intense. She is able to sleep at night but feels like the pain is worse at night. She has a remote history of root canal to the left upper molars in the 80s, had some additional treatment later on.  I saw her on 07/13/2016 for her essential tremors. At that time she reported that her tremors became worse but in the most recent 3 weeks she felt a little better. She had started low-dose prednisone 5 mg every other day after seeing a new rheumatologist. She had a bout of nausea and vomiting and significant constipation, went to the emergency room on 06/26/2016. She finished a recent course of antibiotics.  She saw Megan Millikaninfollow-up on 01/11/17, at which time her tremor was noted to be rather mild.  I first met her on 01/08/2016 at the request of her primary care physician, at which time she reported intermittent facial trembling and bilateral arm and hand tremors. Her history and physical exam are in keeping with mild essential tremor and I suggested we monitor her symptoms. She had recent blood work through her PCP. Her exam was otherwise nonfocal, she did give a family history of tremors in her sister.   01/08/2016: She reports intermittent facial trembling, typically first thing in the morning, intermittently for the past months, since April or soand bilateral arm tremors and hand tremors intermittently as well. I reviewed your office note from 12/31/2015, which you kindly included. She was noted to have a slight voice tremor. She reported no other trembling elsewhere. She denied restless leg symptoms.  She reports that she was treated for the flu and UTI around Easter and was first treated with ampicillin and then with Cipro. After she stopped the antibioticsshe noted weakness and trembling, affecting both upper and lower extremitiesand the neck and jaw.   She has knee pain b/l and has received injections into the L knee.   She has a FHx of tremors  in her sister who has a tremor of her hands or right hand. Sister has trouble feeding herself d/t tremor. Patient reports that sister is several years older. Sister's husband had Parkinson's disease and patient worries a little bit about that as well. She has issues with interstitial cystitis. She has been on physical therapy for this. She sees Dr. Roni Bread for this. She is trying to drink enough water and follow through with the exercises at home as well.  She thinks that her balance is not as good. She took a fall when she was still dealing with her flu and UTI issues. This was at home and she fell against the dresser.  Patient is a non-smoker, does not  drink alcohol and has very little caffeine, in the form of jasmine tea.   Addendum, 5:30 PM: I reviewed blood test results from 11/28/2015: BMP showed creatinine of 1.2, glucose 64, otherwise unremarkable, CK level normal at 42, ESR normal at 15, lipid panel showed total cholesterol of 202, LDL 134, triglycerides 118. TSH on 04/01/2015 was 3.64. Vitamin B12 on 07/16/2014 was 501, magnesium 2.1.   REVIEW OF SYSTEMS: Out of a complete 14 system review of symptoms, the patient complains only of the following symptoms, facial pain, depression and all other reviewed systems are negative.  ALLERGIES: Allergies  Allergen Reactions  . Persantine [Dipyridamole] Other (See Comments)    Migraines   . Butrans [Buprenorphine] Other (See Comments)    "knocks me out"  . Codeine Nausea Only  . Doxycycline Rash  . Esomeprazole Other (See Comments)    Jittery, sore throat  . Irbesartan Diarrhea  . Lactose Intolerance (Gi) Other (See Comments)    GI upset   . Lactulose Other (See Comments)    GI upset  . Losartan Other (See Comments)    Hot and chills  . Maxzide [Hydrochlorothiazide W-Triamterene] Other (See Comments)    Cramps   . Micardis [Telmisartan] Other (See Comments)    headache  . Nortriptyline Hcl Other (See Comments)    "My body doesn't  metabolize it anymore."  . Oxycodone Other (See Comments)    Interstitial cystitis  . Oxycontin [Oxycodone Hcl] Other (See Comments)    Interstitial cystitis   . Pennsaid [Diclofenac Sodium] Hives  . Tramadol Itching    sedation    HOME MEDICATIONS: Outpatient Medications Prior to Visit  Medication Sig Dispense Refill  . cetirizine (ZYRTEC) 10 MG tablet Take 10 mg by mouth daily.    . Cholecalciferol (VITAMIN D-3) 5000 units TABS Take 1 tablet by mouth daily.    . Cyanocobalamin (VITAMIN B-12) 2500 MCG SUBL Place 1 tablet under the tongue daily.    . Digestive Enzymes (DIGESTIVE ENZYME ULTRA) CAPS Take 3 capsules by mouth daily.    . DULoxetine (CYMBALTA) 30 MG capsule Take 30 mg by mouth daily.     . fluticasone (FLONASE) 50 MCG/ACT nasal spray Place 2 sprays into both nostrils daily.    Marland Kitchen olmesartan (BENICAR) 40 MG tablet     . ondansetron (ZOFRAN) 4 MG tablet     . PREDNISONE PO Take 4 mg by mouth daily.    Marland Kitchen PREMARIN vaginal cream INSERT I GRAM INTRAVAGINAL 2 3 TIMES WEEKLY    . UNABLE TO FIND daily. Med Name: Naltrexone 2 mg daily    . buPROPion (WELLBUTRIN XL) 150 MG 24 hr tablet     . hydrochlorothiazide (MICROZIDE) 12.5 MG capsule Take 12.5 mg by mouth daily.    . Magnesium 300 MG CAPS Take 2 capsules by mouth daily.    . pantoprazole (PROTONIX) 40 MG tablet Take 40 mg by mouth 2 (two) times daily.     No facility-administered medications prior to visit.    PAST MEDICAL HISTORY: Past Medical History:  Diagnosis Date  . Adenomatous colon polyp 03/24/2018  . Allergy   . Arthritis   . Cataract   . Chronic headaches   . Depression   . Fibromyalgia   . Gallstones   . GERD (gastroesophageal reflux disease)   . Hyperlipemia   . Hypertension   . Interstitial cystitis   . Lactose intolerance   . LBBB (left bundle branch block)   . Osteoarthritis   . Osteopenia   .  Pneumonia   . Thyroid disease   . Tinnitus   . Trigeminal neuralgia     PAST SURGICAL  HISTORY: Past Surgical History:  Procedure Laterality Date  . CHOLECYSTECTOMY  03/2010  . COLONOSCOPY  2009   Dr. Raford Pitcher   . CYSTOSCOPY     Multiple Cystoscopies with stents or biopsy   . ESOPHAGOGASTRODUODENOSCOPY  10/2013  . Gamma knife radiation  01/2018   for trigeminal neuralgia  . TONSILLECTOMY      FAMILY HISTORY: Family History  Problem Relation Age of Onset  . Heart disease Mother   . Heart disease Father   . Stroke Father   . Stroke Sister   . Asthma Daughter   . Colon cancer Neg Hx     SOCIAL HISTORY: Social History   Socioeconomic History  . Marital status: Married    Spouse name: Not on file  . Number of children: 2  . Years of education: college  . Highest education level: Not on file  Occupational History  . Occupation: housewife    Employer: RETIRED  Tobacco Use  . Smoking status: Never Smoker  . Smokeless tobacco: Never Used  Substance and Sexual Activity  . Alcohol use: No    Alcohol/week: 0.0 standard drinks  . Drug use: No  . Sexual activity: Not on file  Other Topics Concern  . Not on file  Social History Narrative   1 cup of Jasmine tea daily    Social Determinants of Health   Financial Resource Strain:   . Difficulty of Paying Living Expenses:   Food Insecurity:   . Worried About Charity fundraiser in the Last Year:   . Arboriculturist in the Last Year:   Transportation Needs:   . Film/video editor (Medical):   Marland Kitchen Lack of Transportation (Non-Medical):   Physical Activity:   . Days of Exercise per Week:   . Minutes of Exercise per Session:   Stress:   . Feeling of Stress :   Social Connections:   . Frequency of Communication with Friends and Family:   . Frequency of Social Gatherings with Friends and Family:   . Attends Religious Services:   . Active Member of Clubs or Organizations:   . Attends Archivist Meetings:   Marland Kitchen Marital Status:   Intimate Partner Violence:   . Fear of Current or Ex-Partner:    . Emotionally Abused:   Marland Kitchen Physically Abused:   . Sexually Abused:       PHYSICAL EXAM  Vitals:   10/12/19 1257  BP: (!) 141/79  Pulse: 79  Temp: (!) 97.5 F (36.4 C)  Weight: 145 lb (65.8 kg)  Height: 5' 3" (1.6 m)   Body mass index is 25.69 kg/m.  Generalized: Well developed, in no acute distress  Cardiology: normal rate and rhythm, no murmur noted Respiratory: clear to auscultation bilaterally  Neurological examination  Mentation: Alert oriented to time, place, history taking. Follows all commands speech and language fluent Cranial nerve II-XII: Pupils were equal round reactive to light. Extraocular movements were full, visual field were full on confrontational test. Facial sensation and strength were normal. Head turning and shoulder shrug  were normal and symmetric. Motor: The motor testing reveals 5 over 5 strength of all 4 extremities. Good symmetric motor tone is noted throughout.  Sensory: Sensory testing is intact to soft touch on all 4 extremities. No evidence of extinction is noted.   Gait and station: Gait is normal.  Reflexes: Deep tendon reflexes are symmetric and normal bilaterally.   DIAGNOSTIC DATA (LABS, IMAGING, TESTING) - I reviewed patient records, labs, notes, testing and imaging myself where available.  No flowsheet data found.   Lab Results  Component Value Date   WBC 6.2 07/01/2018   HGB 13.5 07/01/2018   HCT 40.5 07/01/2018   MCV 89.6 07/01/2018   PLT 207.0 07/01/2018      Component Value Date/Time   NA 140 06/26/2016 1154   K 3.6 06/26/2016 1154   CL 103 06/26/2016 1154   CO2 27 06/26/2016 1154   GLUCOSE 166 (H) 06/26/2016 1154   BUN 19 06/26/2016 1154   CREATININE 1.20 (H) 06/26/2016 1154   CALCIUM 10.4 (H) 06/26/2016 1154   PROT 7.4 06/26/2016 1154   ALBUMIN 4.5 06/26/2016 1154   AST 23 06/26/2016 1154   ALT 14 06/26/2016 1154   ALKPHOS 51 06/26/2016 1154   BILITOT 0.6 06/26/2016 1154   GFRNONAA 43 (L) 06/26/2016 1154    GFRAA 50 (L) 06/26/2016 1154   No results found for: CHOL, HDL, LDLCALC, LDLDIRECT, TRIG, CHOLHDL No results found for: HGBA1C No results found for: VITAMINB12 Lab Results  Component Value Date   TSH 2.17 04/25/2014      ASSESSMENT AND PLAN 79 y.o. year Fuller female  has a past medical history of Adenomatous colon polyp (03/24/2018), Allergy, Arthritis, Cataract, Chronic headaches, Depression, Fibromyalgia, Gallstones, GERD (gastroesophageal reflux disease), Hyperlipemia, Hypertension, Interstitial cystitis, Lactose intolerance, LBBB (left bundle branch block), Osteoarthritis, Osteopenia, Pneumonia, Thyroid disease, Tinnitus, and Trigeminal neuralgia. here with     ICD-10-CM   1. Trigeminal neuralgia  G50.0   2. Essential tremor  G25.0     [name] is experiencing more left-sided facial pain following her second gamma knife treatment.  She reports that pain has worsened over the past 3 months.  She is experiencing daily sharp stabbing pains.  She has tried and failed Trileptal and Tegretol in the past.  Nortriptyline was not effective.  She has taken gabapentin in the past and feels that she tolerated it well.  We will restart gabapentin 300 mg daily.  She may increase to twice daily dosing if well tolerated.  We have reviewed most common side effects and I have provided additional information in her AVS.  I will have her hold her daily prednisone dose.  We will start a low dose taper pack for acute management of her pain.  She will start with 25 mg daily then decrease by 5 mg every day for 5 days.  I have written these instructions on her prescription as well as her AVS.  She may resume her daily dose of prednisone after completion of her taper pack.  She was encouraged to stay well-hydrated, work on well-balanced diet and regular exercise.  She will follow up with me in 3 to 6 months, sooner if needed.  She verbalizes understanding and agreement with this plan.   No orders of the defined  types were placed in this encounter.    Meds ordered this encounter  Medications  . gabapentin (NEURONTIN) 300 MG capsule    Sig: Take 1 capsule (300 mg total) by mouth 2 (two) times daily.    Dispense:  60 capsule    Refill:  11    Order Specific Question:   Supervising Provider    Answer:   Melvenia Beam [id number]  . predniSONE (DELTASONE) 5 MG tablet    Sig: Take 5 tablets (69m) on day 1,  take 4 tablets (76m) on day 2, take 3 tablets (116m on day 3, taken 2 tablets (1058mon day 4 and take 1 tablet (5mg47mn day 5.    Dispense:  15 tablet    Refill:  0    Order Specific Question:   Supervising Provider    Answer:   AHERMelvenia [id number]  I spent 15 minutes with the patient. 50% of this time was spent counseling and educating patient on plan of care and medications.    Aydee Mcnew Debbora PrestoP-C 10/12/2019, 1:38 PM [facility] [address] [phone] reviewed the above note and documentation by the Nurse Practitioner and agree with the history, exam, assessment and plan as outlined above. I was available for consultation. SaimStar Age, PhD [facility] ([facility] [employer]

## 2019-10-25 DIAGNOSIS — Z923 Personal history of irradiation: Secondary | ICD-10-CM | POA: Diagnosis not present

## 2019-10-25 DIAGNOSIS — Z9889 Other specified postprocedural states: Secondary | ICD-10-CM | POA: Diagnosis not present

## 2019-10-25 DIAGNOSIS — Z79899 Other long term (current) drug therapy: Secondary | ICD-10-CM | POA: Diagnosis not present

## 2019-10-25 DIAGNOSIS — G500 Trigeminal neuralgia: Secondary | ICD-10-CM | POA: Diagnosis not present

## 2019-11-06 DIAGNOSIS — Z1231 Encounter for screening mammogram for malignant neoplasm of breast: Secondary | ICD-10-CM | POA: Diagnosis not present

## 2019-11-06 DIAGNOSIS — Z01419 Encounter for gynecological examination (general) (routine) without abnormal findings: Secondary | ICD-10-CM | POA: Diagnosis not present

## 2019-11-06 DIAGNOSIS — Z6826 Body mass index (BMI) 26.0-26.9, adult: Secondary | ICD-10-CM | POA: Diagnosis not present

## 2019-11-07 DIAGNOSIS — N958 Other specified menopausal and perimenopausal disorders: Secondary | ICD-10-CM | POA: Diagnosis not present

## 2019-11-07 DIAGNOSIS — M8588 Other specified disorders of bone density and structure, other site: Secondary | ICD-10-CM | POA: Diagnosis not present

## 2019-11-09 DIAGNOSIS — Z01818 Encounter for other preprocedural examination: Secondary | ICD-10-CM | POA: Diagnosis not present

## 2019-11-09 DIAGNOSIS — G500 Trigeminal neuralgia: Secondary | ICD-10-CM | POA: Diagnosis not present

## 2019-11-11 DIAGNOSIS — G500 Trigeminal neuralgia: Secondary | ICD-10-CM | POA: Diagnosis not present

## 2019-11-13 DIAGNOSIS — E039 Hypothyroidism, unspecified: Secondary | ICD-10-CM | POA: Diagnosis present

## 2019-11-13 DIAGNOSIS — I10 Essential (primary) hypertension: Secondary | ICD-10-CM | POA: Diagnosis not present

## 2019-11-13 DIAGNOSIS — M797 Fibromyalgia: Secondary | ICD-10-CM | POA: Diagnosis present

## 2019-11-13 DIAGNOSIS — G500 Trigeminal neuralgia: Secondary | ICD-10-CM | POA: Diagnosis not present

## 2019-11-13 DIAGNOSIS — J302 Other seasonal allergic rhinitis: Secondary | ICD-10-CM | POA: Diagnosis present

## 2019-11-13 DIAGNOSIS — Z9889 Other specified postprocedural states: Secondary | ICD-10-CM | POA: Diagnosis not present

## 2019-11-13 DIAGNOSIS — K219 Gastro-esophageal reflux disease without esophagitis: Secondary | ICD-10-CM | POA: Diagnosis not present

## 2019-11-13 DIAGNOSIS — Z9049 Acquired absence of other specified parts of digestive tract: Secondary | ICD-10-CM | POA: Diagnosis not present

## 2019-11-23 ENCOUNTER — Encounter: Payer: Self-pay | Admitting: Family Medicine

## 2019-11-23 DIAGNOSIS — Z4802 Encounter for removal of sutures: Secondary | ICD-10-CM | POA: Diagnosis not present

## 2019-12-06 DIAGNOSIS — Z85828 Personal history of other malignant neoplasm of skin: Secondary | ICD-10-CM | POA: Diagnosis not present

## 2019-12-06 DIAGNOSIS — L821 Other seborrheic keratosis: Secondary | ICD-10-CM | POA: Diagnosis not present

## 2019-12-13 ENCOUNTER — Encounter: Payer: Self-pay | Admitting: Family Medicine

## 2019-12-18 ENCOUNTER — Ambulatory Visit (INDEPENDENT_AMBULATORY_CARE_PROVIDER_SITE_OTHER): Payer: Medicare Other | Admitting: Neurology

## 2019-12-18 ENCOUNTER — Encounter: Payer: Self-pay | Admitting: Neurology

## 2019-12-18 ENCOUNTER — Other Ambulatory Visit: Payer: Self-pay

## 2019-12-18 VITALS — BP 147/74 | HR 74 | Ht 63.0 in | Wt 151.3 lb

## 2019-12-18 DIAGNOSIS — G500 Trigeminal neuralgia: Secondary | ICD-10-CM | POA: Diagnosis not present

## 2019-12-18 DIAGNOSIS — R519 Headache, unspecified: Secondary | ICD-10-CM

## 2019-12-18 DIAGNOSIS — R200 Anesthesia of skin: Secondary | ICD-10-CM

## 2019-12-18 NOTE — Patient Instructions (Signed)
As discussed, at this juncture, I think it is best if you consult with a pain medicine specialist.  I have placed a referral.  I do not believe it is in your best interest to try medications again that you have tried and failed in the past or had intolerances to.  As I am not familiar with the condition of anesthesia dolorosa, I would favor using see a specialist for this.

## 2019-12-18 NOTE — Progress Notes (Signed)
Subjective:    Patient ID: [name] is a 79 y.o. female.  HPI     Interim history:   Ms. [name] is an 79 year old right-handed woman with an underlying medical history of osteoarthritis, allergic rhinitis, fibromyalgia, hyperlipidemia, osteopenia, left bundle branch block, tinnitus, hearing loss, hypertension, trigeminal neuralgia and essential tremor, who presents for follow-up consultation of her facial pain. The patient is accompanied by her husband today. I last saw her on 02/22/2019, at which time she reported that her facial pain had returned on the left side.  She had been started on naltrexone by her rheumatologist.   She saw Amy Lomax in the interim on 05/24/2019, at which time she reported being scheduled for a gamma knife procedure.  She saw Amy Lomax also recently on 10/12/2019, at which time she was started on a prednisone Dosepak as well as gabapentin.  Today, 12/18/2019: She reports having had craniotomy with microvascular decompression on 11/15/2019 with Dr. Arlan Organ.  She reports ongoing pain.  She reports that Dr. 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  She reports a dull and aching pain in the left face.  The sharp pain has subsided thankfully.  She reports feeling numbness in the left face. She cannot take more than 300 mg twice daily of the gabapentin as she feels very sleepy from it at a higher dose.  She is wondering if she should try any other medication she has tried before.   The patient's allergies, current medications, family history, past medical history, past social history, past surgical history and problem list were reviewed and updated as appropriate.    Previously (copied from previous notes for reference):   I saw her on 11/10/2017, at which time she reported worsening facial pain.  I suggested we try her on Trileptal.  She had side effects with the Tegretol.     She called in the interim in June 2019 reporting that she felt too sleepy  from the Trileptal.  She was agreeable to a referral to [facility] for consideration for gamma knife.  She saw Dr. Arlan Organ for this.  She had gamma knife radiosurgery for L trigeminal neuralgia in the interim, on 01/21/2018 and did well.  She had a six-month follow-up appointment with Dr. Arlan Organ in February 2020 and I reviewed the note.     I saw her on 07/14/2017, at which time she reported doing okay, she hadn't stop taking her carbamazepine as she had side effects on the 2 pill daily, felt like she was having sinus headaches and sinus congestion. She took Mucinex over-the-counter for that. She felt that half a pill of carbamazepine once daily may have helped a little bit but overall felt that her facial pain was tolerable enough to use over-the-counter Tylenol or Motrin or Advil. I suggested as needed follow-up. She called in the interim in February 2019 reporting a flareup in her facial pain and requesting to get restarted on Tegretol half a pill daily. She was advised that she could certainly restart at a low dose if helpful.    I saw her on 04/12/2017, at which time she was referred by her rheumatologist for recurrent facial pain. She had seen dentist and endodontist for this. She reported left-sided facial pain with sensitivity to cold. 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 She had been treated for sinus infection and had undergone a CT sinuses as well. She was on a prednisone taper. She was describing a constant sharp  and stabbing pain on the left side of her face. I suggested we proceed with MRI testing and a trial of carbamazepine.   She had a brain MRI with and without contrast on 05/04/2017 which I reviewed: IMPRESSION:    Unremarkable MRI brain (with and without). 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 No acute findings.   We called her with her test results.      04/12/2017: She reports recent onset of jaw pain.  She saw her dentist and also an endodontist for this. I reviewed office records from Dr. Dorothyann Peng her endodontic specialist from 02/16/2017, he suspected possible neuralgic pain. Her pain is on the left side and she is sensitive to cold but this was felt to be within normal limits for her. She saw ENT and had a CT of her sinuses. She has had left upper dental pain and left ear pain for months. I reviewed an office note from ENT from February 2018. 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 CT sinuses from 08/17/2016 showed: IMPRESSION: Normal paranasal sinuses. No acute maxillary dental finding identified.   She is on a prednisone taper, now on 1 mg daily.  She bruises easily.    She had seen her dentist first, then the ENT, then the endodontist.  She had a crown replaced in Jan 2018 in the L lower back, molar. Then had root canal to the tooth adjacent to it.  She desribes the pain as constant, sharp, stabbing, at times prickly sensation, typically starts in the upper left jaw area, radiates sometimes to the ER, sometimes with a dull pain in the ear, sometimes radiates to the left side of the nose and feels like her left upper lip starts trembling when the pain is more intense. She is able to sleep at night but feels like the pain is worse at night. She has a remote history of root canal to the left upper molars in the 80s, had some additional treatment later on.   I saw her on 07/13/2016 for her essential tremors. At that time she reported that her tremors became worse but in the most recent 3 weeks she felt a little better. She had started low-dose prednisone 5 mg every other day after seeing a new rheumatologist. She had a bout of nausea and vomiting and significant constipation, went to the emergency room on 06/26/2016. She finished a recent course of antibiotics.   She saw Ward Givens in  follow-up on 01/11/17, at which time her tremor was noted to be rather mild.   I first met her on 01/08/2016 at the request of her primary care physician, at which time she reported intermittent facial trembling and bilateral arm and hand tremors. Her history and physical exam are in keeping with mild essential tremor and I suggested we monitor her symptoms. She had recent blood work through her PCP. Her exam was otherwise nonfocal, she did give a family history of tremors in her sister.    01/08/2016: She reports intermittent facial trembling, typically first thing in the morning, intermittently for the past months, since April or so and bilateral arm tremors and hand tremors intermittently as well. I reviewed your office note from 12/31/2015, which you kindly included. She was noted to have a slight voice tremor. She reported no other trembling elsewhere. She denied restless leg symptoms.  She reports that she was treated for the flu and UTI around Easter and was first treated with ampicillin and then with Cipro. After she stopped the antibiotics she noted weakness and trembling, affecting both upper and lower extremities and the neck and jaw.    She has knee pain b/l and has received injections into the L knee.    She has a FHx of tremors in her sister who has a tremor of her hands or right hand. Sister has trouble feeding herself d/t tremor. Patient reports that sister is several years older. Sister's husband had Parkinson's disease and patient worries a little bit about that as well. She has issues with interstitial cystitis. She has been on physical therapy for this. She sees Dr. Roni Bread for this. She is trying to drink enough water and follow through with the exercises at home as well.   She thinks that her balance is not as good. She took a fall when she was still dealing with her flu and UTI issues. This was at home and she fell against the dresser.   Patient is a non-smoker, does not drink  alcohol and has very little caffeine, in the form of jasmine tea.    Addendum, 5:30 PM: I reviewed blood test results from 11/28/2015: BMP showed creatinine of 1.2, glucose 64, otherwise unremarkable, CK level normal at 42, ESR normal at 15, lipid panel showed total cholesterol of 202, LDL 134, triglycerides 118. TSH on 04/01/2015 was 3.64. Vitamin B12 on 07/16/2014 was 501, magnesium 2.1.    Her Past Medical History Is Significant For: Past Medical History:  Diagnosis Date  . Adenomatous colon polyp 03/24/2018  . Allergy   . Arthritis   . Cataract   . Chronic headaches   . Depression   . Fibromyalgia   . Gallstones   . GERD (gastroesophageal reflux disease)   . Hyperlipemia   . Hypertension   . Interstitial cystitis   . Lactose intolerance   . LBBB (left bundle branch block)   . Osteoarthritis   . Osteopenia   . Pneumonia   . Thyroid disease   . Tinnitus   . Trigeminal neuralgia     Her Past Surgical History Is Significant For: Past Surgical History:  Procedure Laterality Date  . CHOLECYSTECTOMY  03/2010  . COLONOSCOPY  2009   Dr. Raford Pitcher   . CYSTOSCOPY     Multiple Cystoscopies with stents or biopsy   . ESOPHAGOGASTRODUODENOSCOPY  10/2013  . Gamma knife radiation  01/2018   for trigeminal neuralgia  . TONSILLECTOMY      Her Family History Is Significant For: Family History  Problem Relation Age of Onset  . Heart disease Mother   . Heart disease Father   . Stroke Father   . Stroke Sister   . Asthma Daughter   . Colon cancer Neg Hx     Her Social History Is Significant For: Social History   Socioeconomic History  . Marital status: Married    Spouse name: Not on file  . Number of children: 2  . Years of education: college  . Highest education level: Not on file  Occupational History  . Occupation: housewife    Employer: RETIRED  Tobacco Use  . Smoking status: Never Smoker  . Smokeless tobacco: Never Used  Vaping Use  . Vaping Use: Never  used  Substance and Sexual Activity  . Alcohol use: No    Alcohol/week: 0.0 standard drinks  . Drug use: No  .  Sexual activity: Not on file  Other Topics Concern  . Not on file  Social History Narrative   1 cup of Jasmine tea daily    Social Determinants of Health   Financial Resource Strain:   . Difficulty of Paying Living Expenses:   Food Insecurity:   . Worried About Charity fundraiser in the Last Year:   . Arboriculturist in the Last Year:   Transportation Needs:   . Film/video editor (Medical):   Marland Kitchen Lack of Transportation (Non-Medical):   Physical Activity:   . Days of Exercise per Week:   . Minutes of Exercise per Session:   Stress:   . Feeling of Stress :   Social Connections:   . Frequency of Communication with Friends and Family:   . Frequency of Social Gatherings with Friends and Family:   . Attends Religious Services:   . Active Member of Clubs or Organizations:   . Attends Archivist Meetings:   Marland Kitchen Marital Status:     Her Allergies Are:  Allergies  Allergen Reactions  . Persantine [Dipyridamole] Other (See Comments)    Migraines   . Butrans [Buprenorphine] Other (See Comments)    "knocks me out"  . Codeine Nausea Only  . Doxycycline Rash  . Esomeprazole Other (See Comments)    Jittery, sore throat  . Irbesartan Diarrhea  . Lactose Intolerance (Gi) Other (See Comments)    GI upset   . Lactulose Other (See Comments)    GI upset  . Losartan Other (See Comments)    Hot and chills  . Maxzide [Hydrochlorothiazide W-Triamterene] Other (See Comments)    Cramps   . Micardis [Telmisartan] Other (See Comments)    headache  . Nortriptyline Hcl Other (See Comments)    "My body doesn't metabolize it anymore."  . Oxycodone Other (See Comments)    Interstitial cystitis  . Oxycontin [Oxycodone Hcl] Other (See Comments)    Interstitial cystitis   . Pennsaid [Diclofenac Sodium] Hives  . Tramadol Itching    sedation  :   Her Current  Medications Are:  Outpatient Encounter Medications as of 12/18/2019  Medication Sig  . cetirizine (ZYRTEC) 10 MG tablet Take 10 mg by mouth daily.  . Cholecalciferol (VITAMIN D-3) 5000 units TABS Take 1 tablet by mouth daily.  . Cyanocobalamin (VITAMIN B-12) 2500 MCG SUBL Place 1 tablet under the tongue daily.  . Digestive Enzymes (DIGESTIVE ENZYME ULTRA) CAPS Take 3 capsules by mouth daily.  . DULoxetine (CYMBALTA) 30 MG capsule Take 30 mg by mouth daily.   . fluticasone (FLONASE) 50 MCG/ACT nasal spray Place 2 sprays into both nostrils daily.  Marland Kitchen gabapentin (NEURONTIN) 300 MG capsule Take 1 capsule (300 mg total) by mouth 2 (two) times daily.  Marland Kitchen olmesartan (BENICAR) 40 MG tablet   . ondansetron (ZOFRAN) 4 MG tablet   . predniSONE (DELTASONE) 5 MG tablet Take 5 tablets (43m) on day 1, take 4 tablets (243m on day 2, take 3 tablets (1577mon day 3, taken 2 tablets (53m81mn day 4 and take 1 tablet (5mg)31m day 5.  . PREDNISONE PO Take 4 mg by mouth daily.  . PREMarland KitchenARIN vaginal cream INSERT I GRAM INTRAVAGINAL 2 3 TIMES WEEKLY  . UNABLE TO FIND daily. Med Name: Naltrexone 2 mg daily   No facility-administered encounter medications on file as of 12/18/2019.  :  Review of Systems:  Out of a complete 14 point review of systems, all are reviewed  and negative with the exception of these symptoms as listed below: Review of Systems  Neurological:       Here to discuss med options. Pt reports she had Gamma knife procedure back in May and trigeminal neuralgia pain has not improved. Pt reports surgeon recommended she f/u with [place] on what meds might help.      Objective:  Neurological Exam  Physical Exam Physical Examination:   Vitals:   12/18/19 0923  BP: (!) 147/74  Pulse: 74    General Examination: The patient is a very pleasant 79 y.o. female in no acute distress. She appears well-developed and well-nourished and well groomed.   HEENT:Normocephalic, atraumatic, pupils are equal, round  and reactive to light.  Extraocular tracking is preserved, she has normal facial symmetry and normal sensation in the right face, decreased sensation to pinprick in the left hemiface throughout.  She has some hypersensitivity cold temperature in the left face, normal facial grimacing.  She has a slight voice tremor and mild head tremor which appear to be stable.  Airway examination reveals mild mouth dryness, tongue protrudes centrally in palate elevates symmetrically, tongue movements normal side to side.  Hearing grossly intact.   Chest:Clear to auscultation without wheezing, rhonchi or crackles noted.  Heart:S1+S2+0, regular and normal without murmurs, rubs or gallops noted.   Abdomen:Soft, non-tender and non-distended with normal bowel sounds appreciated on auscultation.  Extremities: There is trace pitting edema in the distal lower extremities bilaterally.   Skin: Warm and dry without trophic changes noted. There are no varicose veins, healing scar R shin from recent skin cancer removal per pt.  Musculoskeletal: exam reveals no obvious joint deformities, tenderness or joint swelling or erythema.   Neurologically:  Mental status: The patient is awake, alert and oriented in all 4 spheres. Her immediate and remote memory, attention, language skills and fund of knowledge are appropriate. There is no evidence of aphasia, agnosia, apraxia or anomia. Speech is clear with normal prosody and enunciation. Thought process is linear. Mood is normal and affect is normal.  Cranial nerves II - XII are as described above under HEENT exam. In addition: shoulder shrug is normal with equal shoulder height noted. Motor exam: Normal bulk, strength and tone is noted. There is no resting tremor or rebound. Reflexes are about 1+.  (On 01/08/16:Archimedes spiral drawing she has very mild tremulousness with the left hand. Handwriting is not tremulous, legible and not micrographic.)  She has a verymild  postural tremor in both hands, No significant action tremor.  Fine motor skills are intact grossly. Cerebellar testing: No dysmetria or intention tremor.  Sensory exam: intact in the upper and lower extremities. Gait, station and balance are all normal. Age-appropriate posture. No shuffling.  Assessment and Plan:   In summary, Opal Dinning is a 79 year old female with an underlying medical history of osteoarthritis, allergic rhinitis, fibromyalgia, hyperlipidemia, osteopenia, left bundle branch block, tinnitus, hearing loss, hypertension, and recurrent headaches,andessentialtremor, who presents forfollow-up consultation of her  left facial pain and numbness.  She has recently undergone craniotomy with microvascular decompression last month.  Prior to that she had gamma knife surgery twice.  Unfortunately, for her left facial pain, she has not only seen multiple specialists including ENT and dentistry, as well as endodontist, she Has tried and failed multiple medications. She had initially very good results with her gamma knife procedure in August of 2019.  She had some recurrence of pain. She tried naltrexone through her rheumatologist. She was on prednisone  since March 2020.  She tried carbamazepine and Trileptal and she had also been on nortriptyline in the past and Cymbalta. She had side effects on gabapentin in the past, but was able to restart this in 04/21. She cannot tolerate more than 300 mg bid.  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  At this juncture, I recommend she consult with a pain management specialist.  I am not in favor of trying her on medications she has previously failed or had intolerance to.  She is agreeable to the referral.  She is advised that she should hear back from the pain management office directly in the next couple of weeks, if she does not hear back she is advised to call our office so we can look into it.  Unfortunately,  there is really not a whole lot I can offer her.  I am not familiar with the diagnosis of anesthesia dolorosa and I think it is in her best interest consult with pain management specialist.  I answered all her questions today and the patient and her husband were in agreement. I spent 25 minutes in total face-to-face time and in reviewing records during pre-charting, more than 50% of which was spent in counseling and coordination of care, reviewing test results, reviewing medications and treatment regimen and/or in discussing or reviewing the diagnosis of chronic facial pain, the prognosis and treatment options. Pertinent laboratory and imaging test results that were available during this visit with the patient were reviewed by me and considered in my medical decision making (see chart for details).

## 2019-12-27 ENCOUNTER — Telehealth: Payer: Self-pay

## 2019-12-27 NOTE — Telephone Encounter (Signed)
Pt calling about pain referral, would like to know the status

## 2019-12-29 ENCOUNTER — Encounter: Payer: Self-pay | Admitting: Physical Medicine and Rehabilitation

## 2020-01-01 NOTE — Telephone Encounter (Signed)
Patient is scheduled   

## 2020-01-19 ENCOUNTER — Telehealth: Payer: Self-pay

## 2020-01-19 NOTE — Telephone Encounter (Signed)
Submitted VOB, synviscOne, bilateral knee. Appt.needs to be after 02/13/2020.

## 2020-01-22 ENCOUNTER — Telehealth: Payer: Self-pay

## 2020-01-22 NOTE — Telephone Encounter (Signed)
Approved for Synvisc One-Bilateral knee Dr. Latanya Presser and Bill Covered @ 100% 2nd insurance to pick up cost after medicare No prior auth required

## 2020-01-22 NOTE — Telephone Encounter (Signed)
Called and scheduled pt for 02/19/20

## 2020-01-22 NOTE — Telephone Encounter (Signed)
Needs Appt after 02/13/20

## 2020-01-24 DIAGNOSIS — Z923 Personal history of irradiation: Secondary | ICD-10-CM | POA: Diagnosis not present

## 2020-01-24 DIAGNOSIS — G96198 Other disorders of meninges, not elsewhere classified: Secondary | ICD-10-CM | POA: Diagnosis not present

## 2020-01-24 DIAGNOSIS — G9782 Other postprocedural complications and disorders of nervous system: Secondary | ICD-10-CM | POA: Diagnosis not present

## 2020-01-24 DIAGNOSIS — R519 Headache, unspecified: Secondary | ICD-10-CM | POA: Diagnosis not present

## 2020-01-25 DIAGNOSIS — E7800 Pure hypercholesterolemia, unspecified: Secondary | ICD-10-CM | POA: Diagnosis not present

## 2020-01-25 DIAGNOSIS — E039 Hypothyroidism, unspecified: Secondary | ICD-10-CM | POA: Diagnosis not present

## 2020-01-25 DIAGNOSIS — M8580 Other specified disorders of bone density and structure, unspecified site: Secondary | ICD-10-CM | POA: Diagnosis not present

## 2020-01-25 DIAGNOSIS — M150 Primary generalized (osteo)arthritis: Secondary | ICD-10-CM | POA: Diagnosis not present

## 2020-01-25 DIAGNOSIS — I10 Essential (primary) hypertension: Secondary | ICD-10-CM | POA: Diagnosis not present

## 2020-01-25 DIAGNOSIS — E559 Vitamin D deficiency, unspecified: Secondary | ICD-10-CM | POA: Diagnosis not present

## 2020-01-25 DIAGNOSIS — N390 Urinary tract infection, site not specified: Secondary | ICD-10-CM | POA: Diagnosis not present

## 2020-01-25 DIAGNOSIS — M797 Fibromyalgia: Secondary | ICD-10-CM | POA: Diagnosis not present

## 2020-02-01 DIAGNOSIS — E7800 Pure hypercholesterolemia, unspecified: Secondary | ICD-10-CM | POA: Diagnosis not present

## 2020-02-01 DIAGNOSIS — I10 Essential (primary) hypertension: Secondary | ICD-10-CM | POA: Diagnosis not present

## 2020-02-01 DIAGNOSIS — Z0000 Encounter for general adult medical examination without abnormal findings: Secondary | ICD-10-CM | POA: Diagnosis not present

## 2020-02-01 DIAGNOSIS — G500 Trigeminal neuralgia: Secondary | ICD-10-CM | POA: Diagnosis not present

## 2020-02-01 DIAGNOSIS — E039 Hypothyroidism, unspecified: Secondary | ICD-10-CM | POA: Diagnosis not present

## 2020-02-01 DIAGNOSIS — N183 Chronic kidney disease, stage 3 unspecified: Secondary | ICD-10-CM | POA: Diagnosis not present

## 2020-02-01 DIAGNOSIS — F339 Major depressive disorder, recurrent, unspecified: Secondary | ICD-10-CM | POA: Diagnosis not present

## 2020-02-01 DIAGNOSIS — K219 Gastro-esophageal reflux disease without esophagitis: Secondary | ICD-10-CM | POA: Diagnosis not present

## 2020-02-07 ENCOUNTER — Encounter
Payer: Medicare Other | Attending: Physical Medicine and Rehabilitation | Admitting: Physical Medicine and Rehabilitation

## 2020-02-07 ENCOUNTER — Encounter: Payer: Self-pay | Admitting: Physical Medicine and Rehabilitation

## 2020-02-07 ENCOUNTER — Other Ambulatory Visit: Payer: Self-pay

## 2020-02-07 VITALS — BP 123/77 | HR 81 | Temp 98.3°F | Ht 63.0 in | Wt 151.0 lb

## 2020-02-07 DIAGNOSIS — G500 Trigeminal neuralgia: Secondary | ICD-10-CM | POA: Diagnosis not present

## 2020-02-07 DIAGNOSIS — G44021 Chronic cluster headache, intractable: Secondary | ICD-10-CM | POA: Diagnosis not present

## 2020-02-07 MED ORDER — PREGABALIN 25 MG PO CAPS
25.0000 mg | ORAL_CAPSULE | Freq: Every day | ORAL | 3 refills | Status: DC
Start: 2020-02-07 — End: 2020-06-26

## 2020-02-07 NOTE — Progress Notes (Signed)
Subjective:    Patient ID: [name], female    DOB: 08-May-1941, 79 y.o.   MRN: [id number]  HPI  [name] is an 79 year old woman who presents for left sided neuropathic pain in her face.   Had two gamma knife treatments. The first one helped and the second one did not. On Amy she had a craniotomy. He was surprised after the surgery that she still has post-operative pain. The inside of her mouth hurt and the head heurt. In the first sstage of the surgery thy could not find a nerve touching the blood vessel. 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 She was in ICU, the surgical ward, and did very well post-surgery. She was discharged on opioid with tylenol and had this refilled when she went to see the surgical nice to get the staples were removed. She weaned herself off of this. She was advised not to take it because of its addictive potential.   Average pain is 8/10, next level pain is 7/10.   The clipping did not help at all. Subsequent to the pin she had swelling in the surgical side was starting to ooze out. When she wakes in the morning it can be puffy.  The pain starts in her head and radiates down the left side of her face.   She has tried Cymbalta for fibromyalgia. 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 She also took Naltrexone for this. Before the surgery she tried Gabapentin. She had been on this 6 years ago for sciatic nerve pain. This did help but it sedates her. She takes two 300mg  AM and PM. She sleeps through the night. She tried Carbamazepine and this did   When the swelling started she would be sleeping most of the day. In the last few days she has not done as much.   Husband can see her twitch when she has these pains.   Pain Inventory Average Pain 8 Pain Right Now 7 My pain is sharp, burning, stabbing, tingling and aching  LOCATION OF PAIN  head  BOWEL Number of stools per week: 7 Oral laxative use No  Type of laxative: n/a  Enema or suppository use No  History of  colostomy No  Incontinent No   BLADDER Pads In and out cath, frequency n/a Able to self cath No  Bladder incontinence Yes  Frequent urination No  Leakage with coughing No  Difficulty starting stream No  Incomplete bladder emptying No    Mobility walk without assistance how many minutes can you walk? 10 ability to climb steps?  yes do you drive?  yes  Function retired  Neuro/Psych numbness tremor tingling spasms  Prior Studies new  Physicians involved in your care new   Family History  Problem Relation Age of Onset  . Heart disease Mother   . Heart disease Father   . Stroke Father   . Stroke Sister   . Asthma Daughter   . Colon cancer Neg Hx    Social History   Socioeconomic History  . Marital status: Married    Spouse name: Not on file  . Number of children: 2  . Years of education: college  . Highest education level: Not on file  Occupational History  . Occupation: housewife    Employer: RETIRED  Tobacco Use  . Smoking status: Never Smoker  . Smokeless tobacco: Never Used  Vaping Use  . Vaping Use: Never used  Substance and Sexual Activity  . Alcohol use: No  Alcohol/week: 0.0 standard drinks  . Drug use: No  . Sexual activity: Not on file  Other Topics Concern  . Not on file  Social History Narrative   1 cup of Jasmine tea daily    Social Determinants of Health   Financial Resource Strain:   . Difficulty of Paying Living Expenses:   Food Insecurity:   . Worried About Charity fundraiser in the Last Year:   . Arboriculturist in the Last Year:   Transportation Needs:   . Film/video editor (Medical):   Marland Kitchen Lack of Transportation (Non-Medical):   Physical Activity:   . Days of Exercise per Week:   . Minutes of Exercise per Session:   Stress:   . Feeling of Stress :   Social Connections:   . Frequency of Communication with Friends and Family:   . Frequency of Social Gatherings with Friends and Family:   . Attends Religious  Services:   . Active Member of Clubs or Organizations:   . Attends Archivist Meetings:   Marland Kitchen Marital Status:    Past Surgical History:  Procedure Laterality Date  . CHOLECYSTECTOMY  03/2010  . COLONOSCOPY  2009   Dr. Raford Pitcher   . CYSTOSCOPY     Multiple Cystoscopies with stents or biopsy   . ESOPHAGOGASTRODUODENOSCOPY  10/2013  . Gamma knife radiation  01/2018   for trigeminal neuralgia  . TONSILLECTOMY     Past Medical History:  Diagnosis Date  . Adenomatous colon polyp 03/24/2018  . Allergy   . Arthritis   . Cataract   . Chronic headaches   . Depression   . Fibromyalgia   . Gallstones   . GERD (gastroesophageal reflux disease)   . Hyperlipemia   . Hypertension   . Interstitial cystitis   . Lactose intolerance   . LBBB (left bundle branch block)   . Osteoarthritis   . Osteopenia   . Pneumonia   . Thyroid disease   . Tinnitus   . Trigeminal neuralgia    BP 123/77   Pulse 81   Temp 98.3 F (36.8 C)   Ht 5\' 3"  (1.6 m)   Wt 151 lb (68.5 kg)   SpO2 97%   BMI 26.75 kg/m   Opioid Risk Score:   Fall Risk Score:  `1  Depression screen PHQ 2/9  Depression screen [facility] 2/9 02/07/2020 06/30/2016 06/30/2016  Decreased Interest 3 0 0  Down, Depressed, Hopeless 2 0 0  PHQ - 2 Score 5 0 0  Altered sleeping 0 - -  Tired, decreased energy 3 - -  Change in appetite 0 - -  Feeling bad or failure about yourself  0 - -  Trouble concentrating 0 - -  Moving slowly or fidgety/restless 0 - -  Suicidal thoughts 0 - -  PHQ-9 Score 8 - -  Difficult doing work/chores Very difficult - -   Review of Systems  Constitutional: Negative.   HENT: Negative.   Eyes: Negative.   Respiratory: Negative.   Cardiovascular: Negative.   Gastrointestinal: Positive for nausea.  Endocrine: Negative.   Genitourinary: Positive for difficulty urinating.       Incontinence  Musculoskeletal:       Spasms  Skin: Negative.   Allergic/Immunologic: Negative.   Neurological: Positive  for tremors and weakness.       Tingling  Hematological: Negative.   Psychiatric/Behavioral: Negative.   All other systems reviewed and are negative.      Objective:  Physical Exam Gen: no distress, normal appearing HEENT: oral mucosa pink and moist, NCAT Cardio: Reg rate Chest: normal effort, normal rate of breathing Abd: soft, non-distended Ext: no edema Skin: swelling behind left ear.  Neuro: Alert and oriented x3 Musculoskeletal: Psych: pleasant, normal affect    Assessment & Plan:  [name] is an 79 year old woman who presents to establish care for left sided facial pain.   1) left sided trigeminal neuralgia -reviewed patient's medical notes -prescribed Lyrica 25mg  HS for neuropathic pain.  -if this is helpful, can consider weaning off Gabapentin.  -encouraged anti-inflammatory diet.   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   3) AKI: encouraged hydration.   4) Hypernatremia: encouraged hydration  All questions answered. RTC in 1 month.

## 2020-02-08 DIAGNOSIS — M797 Fibromyalgia: Secondary | ICD-10-CM | POA: Diagnosis not present

## 2020-02-08 DIAGNOSIS — M150 Primary generalized (osteo)arthritis: Secondary | ICD-10-CM | POA: Diagnosis not present

## 2020-02-08 DIAGNOSIS — M8580 Other specified disorders of bone density and structure, unspecified site: Secondary | ICD-10-CM | POA: Diagnosis not present

## 2020-02-08 DIAGNOSIS — M353 Polymyalgia rheumatica: Secondary | ICD-10-CM | POA: Diagnosis not present

## 2020-02-08 DIAGNOSIS — M25562 Pain in left knee: Secondary | ICD-10-CM | POA: Diagnosis not present

## 2020-02-08 DIAGNOSIS — M25559 Pain in unspecified hip: Secondary | ICD-10-CM | POA: Diagnosis not present

## 2020-02-08 DIAGNOSIS — M170 Bilateral primary osteoarthritis of knee: Secondary | ICD-10-CM | POA: Diagnosis not present

## 2020-02-14 ENCOUNTER — Other Ambulatory Visit: Payer: Self-pay

## 2020-02-14 ENCOUNTER — Encounter: Payer: Self-pay | Admitting: Physical Medicine and Rehabilitation

## 2020-02-14 ENCOUNTER — Encounter (HOSPITAL_BASED_OUTPATIENT_CLINIC_OR_DEPARTMENT_OTHER): Payer: Medicare Other | Admitting: Physical Medicine and Rehabilitation

## 2020-02-14 VITALS — Temp 98.0°F | Wt 151.0 lb

## 2020-02-14 DIAGNOSIS — G44021 Chronic cluster headache, intractable: Secondary | ICD-10-CM | POA: Diagnosis not present

## 2020-02-14 DIAGNOSIS — G500 Trigeminal neuralgia: Secondary | ICD-10-CM | POA: Diagnosis not present

## 2020-02-14 NOTE — Progress Notes (Addendum)
Subjective:    Patient ID: NELSIE DOMINO, female    DOB: 1941/02/20, 79 y.o.   MRN: [id number]  HPI  Mrs. Matzke is an 79 year old woman who presents for follow-up of trigeminal neuralgia.   The gamma knife helped her for 12 months. The second one made things worse which is what brought the to surgery. The doctor said the surgery went perfectly and he was shocked that she still has the pain.  Yesterday she tried to do dusting and she just couldn't do it because the pain.   She takes Gabapentin 300mg  TID which does help but makes her too sleepy. She has tried Nortryptiline 10mg  and this has not helped.   Norco has helped in the past. Pain was present all morning and she was unable to do much.  Pain Inventory Average Pain 9 Pain Right Now 7 My pain is constant, sharp, burning, dull, stabbing, tingling and numb  In the last 24 hours, has pain interfered with the following? General activity 10 Relation with others 7 Enjoyment of life 5 What TIME of day is your pain at its worst? morning , daytime and night Sleep (in general) Good  Pain is worse with: bending and some activites Pain improves with: rest Relief from Meds: 4  Family History  Problem Relation Age of Onset  . Heart disease Mother   . Heart disease Father   . Stroke Father   . Stroke Sister   . Asthma Daughter   . Colon cancer Neg Hx    Social History   Socioeconomic History  . Marital status: Married    Spouse name: Not on file  . Number of children: 2  . Years of education: college  . Highest education level: Not on file  Occupational History  . Occupation: housewife    Employer: RETIRED  Tobacco Use  . Smoking status: Never Smoker  . Smokeless tobacco: Never Used  Vaping Use  . Vaping Use: Never used  Substance and Sexual Activity  . Alcohol use: No    Alcohol/week: 0.0 standard drinks  . Drug use: No  . Sexual activity: Not on file  Other Topics Concern  . Not on file  Social History  Narrative   1 cup of Jasmine tea daily    Social Determinants of Health   Financial Resource Strain:   . Difficulty of Paying Living Expenses: Not on file  Food Insecurity:   . Worried About Charity fundraiser in the Last Year: Not on file  . Ran Out of Food in the Last Year: Not on file  Transportation Needs:   . Lack of Transportation (Medical): Not on file  . Lack of Transportation (Non-Medical): Not on file  Physical Activity:   . Days of Exercise per Week: Not on file  . Minutes of Exercise per Session: Not on file  Stress:   . Feeling of Stress : Not on file  Social Connections:   . Frequency of Communication with Friends and Family: Not on file  . Frequency of Social Gatherings with Friends and Family: Not on file  . Attends Religious Services: Not on file  . Active Member of Clubs or Organizations: Not on file  . Attends Archivist Meetings: Not on file  . Marital Status: Not on file   Past Surgical History:  Procedure Laterality Date  . CHOLECYSTECTOMY  03/2010  . COLONOSCOPY  2009   Dr. Raford Pitcher   . CYSTOSCOPY  Multiple Cystoscopies with stents or biopsy   . ESOPHAGOGASTRODUODENOSCOPY  10/2013  . Gamma knife radiation  01/2018   for trigeminal neuralgia  . TONSILLECTOMY     Past Surgical History:  Procedure Laterality Date  . CHOLECYSTECTOMY  03/2010  . COLONOSCOPY  2009   Dr. Raford Pitcher   . CYSTOSCOPY     Multiple Cystoscopies with stents or biopsy   . ESOPHAGOGASTRODUODENOSCOPY  10/2013  . Gamma knife radiation  01/2018   for trigeminal neuralgia  . TONSILLECTOMY     Past Medical History:  Diagnosis Date  . Adenomatous colon polyp 03/24/2018  . Allergy   . Arthritis   . Cataract   . Chronic headaches   . Depression   . Fibromyalgia   . Gallstones   . GERD (gastroesophageal reflux disease)   . Hyperlipemia   . Hypertension   . Interstitial cystitis   . Lactose intolerance   . LBBB (left bundle branch block)   .  Osteoarthritis   . Osteopenia   . Pneumonia   . Thyroid disease   . Tinnitus   . Trigeminal neuralgia    Temp 98 F (36.7 C)   Wt 151 lb (68.5 kg)   BMI 26.75 kg/m   Opioid Risk Score:   Fall Risk Score:  `1  Depression screen PHQ 2/9  Depression screen [facility] 2/9 02/07/2020 06/30/2016 06/30/2016  Decreased Interest 3 0 0  Down, Depressed, Hopeless 2 0 0  PHQ - 2 Score 5 0 0  Altered sleeping 0 - -  Tired, decreased energy 3 - -  Change in appetite 0 - -  Feeling bad or failure about yourself  0 - -  Trouble concentrating 0 - -  Moving slowly or fidgety/restless 0 - -  Suicidal thoughts 0 - -  PHQ-9 Score 8 - -  Difficult doing work/chores Very difficult - -       Objective:   Physical Exam Gen: no distress, normal appearing HEENT: oral mucosa pink and moist, NCAT Cardio: Reg rate Chest: normal effort, normal rate of breathing Abd: soft, non-distended Ext: no edema Skin: intact Neuro: Alert and oriented/  Musculoskeletal:  Psych: pleasant, normal affect     Assessment & Plan:  Mrs. Portee is an 79 year old woman who presents for follow-up of  left sided facial pain.   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 She does have a history of migraines.  -reviewed patient's medical notes -failed the Lyric- this made the pain worse.  -Increase Gabapentin to 300/300/600 -encouraged anti-inflammatory diet.  -she had sinusitis that was viral and was treated with antibiotics. This was in the early 26s. The symptoms resolved after 2 months. She uses to get sinus infections and saline rinse.  -Will prescribe 100% oxygen via facemask to assess if this helps (then may be more of a cluster headache). Can also trial other headache medications. she has had at least 5 very severe headache attaches lasting 15-180 minutes when untreated, so this should be covered by medicare.  -Discussed [employer] system as an option  of pain treatment via neuromodulation. Provided following link for patient to learn more about the system: https://www.[employer].com/. Called [place] [employer] rep to ask if she has had any benefit for trigeminal neuralgia patients. She has not but will check with her colleagues and let me know. I called patient after appointment today to update them.   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   3) AKI: encouraged hydration.   4) Hypernatremia: encouraged hydration  5) General health and pain: provided with list of healthy foods that are both nutritious and fight pain.   All questions answered. RTC in 1 month.  40 minutes spent discussing pain, cluster migraines and their treatment, [employer] system, medications she has tried thus far and future options, discussing how the pain has impacted her quality of life, calling Raegan at [employer] [facility] to discuss whether this would be helpful for trigeminal neuralgia, and leaving message with patient to relay our discussion.

## 2020-02-19 ENCOUNTER — Encounter: Payer: Self-pay | Admitting: Orthopedic Surgery

## 2020-02-19 ENCOUNTER — Ambulatory Visit (INDEPENDENT_AMBULATORY_CARE_PROVIDER_SITE_OTHER): Payer: Medicare Other | Admitting: Orthopedic Surgery

## 2020-02-19 VITALS — Ht 63.0 in | Wt 150.0 lb

## 2020-02-19 DIAGNOSIS — M1712 Unilateral primary osteoarthritis, left knee: Secondary | ICD-10-CM | POA: Diagnosis not present

## 2020-02-19 DIAGNOSIS — M1711 Unilateral primary osteoarthritis, right knee: Secondary | ICD-10-CM | POA: Diagnosis not present

## 2020-02-19 DIAGNOSIS — M170 Bilateral primary osteoarthritis of knee: Secondary | ICD-10-CM

## 2020-02-20 ENCOUNTER — Encounter: Payer: Self-pay | Admitting: Orthopedic Surgery

## 2020-02-20 DIAGNOSIS — M1711 Unilateral primary osteoarthritis, right knee: Secondary | ICD-10-CM

## 2020-02-20 MED ORDER — HYLAN G-F 20 48 MG/6ML IX SOSY
48.0000 mg | PREFILLED_SYRINGE | INTRA_ARTICULAR | Status: AC | PRN
  Administered 2020-02-20: 48 mg via INTRA_ARTICULAR

## 2020-02-20 MED ORDER — LIDOCAINE HCL 1 % IJ SOLN
5.0000 mL | INTRAMUSCULAR | Status: AC | PRN
Start: 2020-02-20 — End: 2020-02-20
  Administered 2020-02-20: 5 mL

## 2020-02-20 MED ORDER — LIDOCAINE HCL 1 % IJ SOLN
5.0000 mL | INTRAMUSCULAR | Status: AC | PRN
  Administered 2020-02-20: 5 mL

## 2020-02-20 NOTE — Progress Notes (Signed)
° °  Procedure Note  Patient: [name]             Date of Birth: 1940-11-02           MRN: [id number]             Visit Date: 02/19/2020  Procedures: Visit Diagnoses:  1. Primary osteoarthritis of both knees     Large Joint Inj: bilateral knee on 02/20/2020 10:11 PM Indications: pain, joint swelling and diagnostic evaluation Details: 18 G 1.5 in needle, superolateral approach  Arthrogram: No  Medications (Right): 5 mL lidocaine 1 %; 48 mg Hylan 48 MG/6ML Medications (Left): 5 mL lidocaine 1 %; 48 mg Hylan 48 MG/6ML Outcome: tolerated well, no immediate complications Procedure, treatment alternatives, risks and benefits explained, specific risks discussed. Consent was given by the patient. Immediately prior to procedure a time out was called to verify the correct patient, procedure, equipment, support staff and site/side marked as required. Patient was prepped and draped in the usual sterile fashion.

## 2020-02-27 ENCOUNTER — Encounter: Payer: Self-pay | Admitting: Physical Medicine and Rehabilitation

## 2020-02-27 ENCOUNTER — Other Ambulatory Visit: Payer: Self-pay

## 2020-02-27 ENCOUNTER — Encounter
Payer: Medicare Other | Attending: Physical Medicine and Rehabilitation | Admitting: Physical Medicine and Rehabilitation

## 2020-02-27 VITALS — BP 155/64 | HR 77 | Temp 97.7°F | Ht 63.0 in | Wt 150.0 lb

## 2020-02-27 DIAGNOSIS — G500 Trigeminal neuralgia: Secondary | ICD-10-CM

## 2020-02-27 DIAGNOSIS — G44021 Chronic cluster headache, intractable: Secondary | ICD-10-CM | POA: Insufficient documentation

## 2020-02-27 MED ORDER — AMITRIPTYLINE HCL 10 MG PO TABS: 10.0000 mg | ORAL_TABLET | Freq: Every day | ORAL | 3 refills | Status: DC

## 2020-02-27 NOTE — Progress Notes (Signed)
Subjective:    Patient ID: [name], female    DOB: Nov 03, 1940, 79 y.o.   MRN: [id number]  HPI  She has having some nausea, lightheadedness and felt this may be related to her Gabapentin. She did not take any yesterday and then felt more sever pain in the evening. She also gets electrical shooting across her jaw.   She received her oxygen machine. It provides a little bit of relief. She gets some relaxation after using the oxygen. The benefit is short-lived. Her husband bought 6 tubes. It was not covered by Medicare.  She is willing to try Amitriptyline.   This morning and last night the pain was extremely severe. The inside of her night really hirt.   The pain is 7/10 on average.   When the pain is severe it can make her depressed.   Pain Inventory Average Pain 7 Pain Right Now 10 My pain is sharp, burning, stabbing, tingling and aching  In the last 24 hours, has pain interfered with the following? General activity 7 Relation with others 0 Enjoyment of life 7 What TIME of day is your pain at its worst? morning  and evening Sleep (in general) Good  Pain is worse with: bending Pain improves with: rest, heat/ice and medication Relief from Meds: 2  Family History  Problem Relation Age of Onset  . Heart disease Mother   . Heart disease Father   . Stroke Father   . Stroke Sister   . Asthma Daughter   . Colon cancer Neg Hx    Social History   Socioeconomic History  . Marital status: Married    Spouse name: Not on file  . Number of children: 2  . Years of education: college  . Highest education level: Not on file  Occupational History  . Occupation: housewife    Employer: RETIRED  Tobacco Use  . Smoking status: Never Smoker  . Smokeless tobacco: Never Used  Vaping Use  . Vaping Use: Never used  Substance and Sexual Activity  . Alcohol use: No    Alcohol/week: 0.0 standard drinks  . Drug use: No  . Sexual activity: Not on file  Other Topics Concern    . Not on file  Social History Narrative   1 cup of Jasmine tea daily    Social Determinants of Health   Financial Resource Strain:   . Difficulty of Paying Living Expenses: Not on file  Food Insecurity:   . Worried About Charity fundraiser in the Last Year: Not on file  . Ran Out of Food in the Last Year: Not on file  Transportation Needs:   . Lack of Transportation (Medical): Not on file  . Lack of Transportation (Non-Medical): Not on file  Physical Activity:   . Days of Exercise per Week: Not on file  . Minutes of Exercise per Session: Not on file  Stress:   . Feeling of Stress : Not on file  Social Connections:   . Frequency of Communication with Friends and Family: Not on file  . Frequency of Social Gatherings with Friends and Family: Not on file  . Attends Religious Services: Not on file  . Active Member of Clubs or Organizations: Not on file  . Attends Archivist Meetings: Not on file  . Marital Status: Not on file   Past Surgical History:  Procedure Laterality Date  . CHOLECYSTECTOMY  03/2010  . COLONOSCOPY  2009   Dr. Raford Pitcher   .  CYSTOSCOPY     Multiple Cystoscopies with stents or biopsy   . ESOPHAGOGASTRODUODENOSCOPY  10/2013  . Gamma knife radiation  01/2018   for trigeminal neuralgia  . TONSILLECTOMY     Past Surgical History:  Procedure Laterality Date  . CHOLECYSTECTOMY  03/2010  . COLONOSCOPY  2009   Dr. Raford Pitcher   . CYSTOSCOPY     Multiple Cystoscopies with stents or biopsy   . ESOPHAGOGASTRODUODENOSCOPY  10/2013  . Gamma knife radiation  01/2018   for trigeminal neuralgia  . TONSILLECTOMY     Past Medical History:  Diagnosis Date  . Adenomatous colon polyp 03/24/2018  . Allergy   . Arthritis   . Cataract   . Chronic headaches   . Depression   . Fibromyalgia   . Gallstones   . GERD (gastroesophageal reflux disease)   . Hyperlipemia   . Hypertension   . Interstitial cystitis   . Lactose intolerance   . LBBB  (left bundle branch block)   . Osteoarthritis   . Osteopenia   . Pneumonia   . Thyroid disease   . Tinnitus   . Trigeminal neuralgia    BP (!) 155/64   Pulse 77   Temp 97.7 F (36.5 C)   Ht 5\' 3"  (1.6 m)   Wt 150 lb (68 kg)   SpO2 98%   BMI 26.57 kg/m   Opioid Risk Score:   Fall Risk Score:  `1  Depression screen PHQ 2/9  Depression screen [facility] 2/9 02/14/2020 02/07/2020 06/30/2016 06/30/2016  Decreased Interest 0 3 0 0  Down, Depressed, Hopeless 0 2 0 0  PHQ - 2 Score 0 5 0 0  Altered sleeping - 0 - -  Tired, decreased energy - 3 - -  Change in appetite - 0 - -  Feeling bad or failure about yourself  - 0 - -  Trouble concentrating - 0 - -  Moving slowly or fidgety/restless - 0 - -  Suicidal thoughts - 0 - -  PHQ-9 Score - 8 - -  Difficult doing work/chores - Very difficult - -    Review of Systems  Constitutional: Negative.   HENT: Negative.   Eyes: Positive for visual disturbance.  Respiratory: Negative.   Cardiovascular: Negative.   Gastrointestinal: Negative.   Endocrine: Negative.   Genitourinary: Negative.   Musculoskeletal: Positive for gait problem.  Skin: Negative.   Allergic/Immunologic: Negative.   Neurological: Positive for dizziness, tremors, light-headedness and numbness.       Tingling   Hematological: Negative.   Psychiatric/Behavioral: Negative.   All other systems reviewed and are negative.      Objective:   Physical Exam Gen: no distress, normal appearing HEENT: oral mucosa pink and moist, NCAT Cardio: Reg rate Chest: normal effort, normal rate of breathing Abd: soft, non-distended Ext: no edema Skin: intact Neuro: Alert and oriented x3. Left sided decreased sensation in upper, middle, and lower portion of face Psych: pleasant, normal affect    Assessment & Plan:  Mrs. Gatliff is an 79 year old woman who presents for follow-up of  left sided facial pain.   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 She does have a history of migraines.  -reviewed patient's medical notes -failed the Lyrica- this made the pain worse.  -Increase in Gabapentin to 300/300/600 made her too sleepy- return to 300mg  TID -encouraged anti-inflammatory diet.  -she had sinusitis that was viral and was  treated with antibiotics. This was in the early 71s. The symptoms resolved after 2 months. She uses to get sinus infections and saline rinse.  -Will prescribe 100% oxygen via facemask to assess if this helps (then may be more of a cluster headache). Can also trial other headache medications. she has had at least 5 very severe headache attaches lasting 15-180 minutes when untreated, so this should be covered by medicare. I have called her oxygen company today to discuss this. Have not yet heard back from them.  -Discussed that unfortunately [employer] is not indicated for craniofacial pain. -Trial Amitriptyline 10mg  HS.  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  -she is a good metabolizer of amitriptyline and has tolerated nortrypilline in the past, but without much benefit.   3) AKI: encouraged hydration.   4) Hypernatremia: encouraged hydration  5) General health and pain: provided with list of healthy foods that are both nutritious and fight pain.   All questions answered. RTC in 1 month.

## 2020-02-29 ENCOUNTER — Telehealth: Payer: Self-pay | Admitting: *Deleted

## 2020-02-29 DIAGNOSIS — G500 Trigeminal neuralgia: Secondary | ICD-10-CM | POA: Diagnosis not present

## 2020-02-29 NOTE — Telephone Encounter (Signed)
Faxed Rx for oxygen to [facility] per Dr. Ranell Patrick

## 2020-03-05 DIAGNOSIS — Z23 Encounter for immunization: Secondary | ICD-10-CM | POA: Diagnosis not present

## 2020-03-06 ENCOUNTER — Ambulatory Visit: Payer: Medicare Other | Admitting: Physical Medicine and Rehabilitation

## 2020-03-14 ENCOUNTER — Encounter: Payer: Self-pay | Admitting: Physical Medicine and Rehabilitation

## 2020-03-14 ENCOUNTER — Encounter (HOSPITAL_BASED_OUTPATIENT_CLINIC_OR_DEPARTMENT_OTHER): Payer: Medicare Other | Admitting: Physical Medicine and Rehabilitation

## 2020-03-14 ENCOUNTER — Other Ambulatory Visit: Payer: Self-pay

## 2020-03-14 VITALS — BP 132/77 | HR 69 | Temp 97.9°F | Ht 63.0 in | Wt 153.6 lb

## 2020-03-14 DIAGNOSIS — E785 Hyperlipidemia, unspecified: Secondary | ICD-10-CM | POA: Diagnosis not present

## 2020-03-14 DIAGNOSIS — F0390 Unspecified dementia without behavioral disturbance: Secondary | ICD-10-CM | POA: Diagnosis not present

## 2020-03-14 DIAGNOSIS — G44021 Chronic cluster headache, intractable: Secondary | ICD-10-CM | POA: Diagnosis not present

## 2020-03-14 DIAGNOSIS — G500 Trigeminal neuralgia: Secondary | ICD-10-CM | POA: Diagnosis not present

## 2020-03-14 DIAGNOSIS — M797 Fibromyalgia: Secondary | ICD-10-CM | POA: Diagnosis not present

## 2020-03-14 NOTE — Progress Notes (Signed)
Subjective:    Patient ID: [name], female    DOB: December 31, 1940, 79 y.o.   MRN: [id number]  HPI  She has having some nausea, lightheadedness and felt this may be related to her Gabapentin. She did not take any yesterday and then felt more sever pain in the evening. She also gets electrical shooting across her jaw.   She does not feel that she has cluster headaches as the pain has been so constant. She feels that she is getting worse. The oxygen made her feel relaxed but it is not helping.   The Sarna lotion does help with the itching in her head.   She was willing to try the Amitriptyline but it makes her too sleepy in the morning.   Her teeth and left eye incredibly hurt.   She feels that the Gabapentin has been making her dizziness.   She asks about biofeedback.   The pain is 7/10 on average, similar to last time.   When the pain is severe it can make her depressed.   Pain Inventory Average Pain 7 Pain Right Now 8 My pain is sharp, burning, stabbing, tingling and aching  In the last 24 hours, has pain interfered with the following? General activity 4 Relation with others 3 Enjoyment of life 10 What TIME of day is your pain at its worst? morning  and daytime Sleep (in general) Good  Pain is worse with: bending and inactivity Pain improves with: rest, heat/ice and medication Relief from Meds: 2  Family History  Problem Relation Age of Onset  . Heart disease Mother   . Heart disease Father   . Stroke Father   . Stroke Sister   . Asthma Daughter   . Colon cancer Neg Hx    Social History   Socioeconomic History  . Marital status: Married    Spouse name: Not on file  . Number of children: 2  . Years of education: college  . Highest education level: Not on file  Occupational History  . Occupation: housewife    Employer: RETIRED  Tobacco Use  . Smoking status: Never Smoker  . Smokeless tobacco: Never Used  Vaping Use  . Vaping Use: Never used    Substance and Sexual Activity  . Alcohol use: No    Alcohol/week: 0.0 standard drinks  . Drug use: No  . Sexual activity: Not on file  Other Topics Concern  . Not on file  Social History Narrative   1 cup of Jasmine tea daily    Social Determinants of Health   Financial Resource Strain:   . Difficulty of Paying Living Expenses: Not on file  Food Insecurity:   . Worried About Charity fundraiser in the Last Year: Not on file  . Ran Out of Food in the Last Year: Not on file  Transportation Needs:   . Lack of Transportation (Medical): Not on file  . Lack of Transportation (Non-Medical): Not on file  Physical Activity:   . Days of Exercise per Week: Not on file  . Minutes of Exercise per Session: Not on file  Stress:   . Feeling of Stress : Not on file  Social Connections:   . Frequency of Communication with Friends and Family: Not on file  . Frequency of Social Gatherings with Friends and Family: Not on file  . Attends Religious Services: Not on file  . Active Member of Clubs or Organizations: Not on file  . Attends Archivist  Meetings: Not on file  . Marital Status: Not on file   Past Surgical History:  Procedure Laterality Date  . CHOLECYSTECTOMY  03/2010  . COLONOSCOPY  2009   Dr. Raford Pitcher   . CYSTOSCOPY     Multiple Cystoscopies with stents or biopsy   . ESOPHAGOGASTRODUODENOSCOPY  10/2013  . Gamma knife radiation  01/2018   for trigeminal neuralgia  . TONSILLECTOMY     Past Surgical History:  Procedure Laterality Date  . CHOLECYSTECTOMY  03/2010  . COLONOSCOPY  2009   Dr. Raford Pitcher   . CYSTOSCOPY     Multiple Cystoscopies with stents or biopsy   . ESOPHAGOGASTRODUODENOSCOPY  10/2013  . Gamma knife radiation  01/2018   for trigeminal neuralgia  . TONSILLECTOMY     Past Medical History:  Diagnosis Date  . Adenomatous colon polyp 03/24/2018  . Allergy   . Arthritis   . Cataract   . Chronic headaches   . Depression   . Fibromyalgia    . Gallstones   . GERD (gastroesophageal reflux disease)   . Hyperlipemia   . Hypertension   . Interstitial cystitis   . Lactose intolerance   . LBBB (left bundle branch block)   . Osteoarthritis   . Osteopenia   . Pneumonia   . Thyroid disease   . Tinnitus   . Trigeminal neuralgia    BP 132/77   Pulse 69   Temp 97.9 F (36.6 C)   Ht 5\' 3"  (1.6 m)   Wt 153 lb 9.6 oz (69.7 kg)   SpO2 97%   BMI 27.21 kg/m   Opioid Risk Score:   Fall Risk Score:  `1  Depression screen PHQ 2/9  Depression screen [facility] 2/9 02/14/2020 02/07/2020 06/30/2016 06/30/2016  Decreased Interest 0 3 0 0  Down, Depressed, Hopeless 0 2 0 0  PHQ - 2 Score 0 5 0 0  Altered sleeping - 0 - -  Tired, decreased energy - 3 - -  Change in appetite - 0 - -  Feeling bad or failure about yourself  - 0 - -  Trouble concentrating - 0 - -  Moving slowly or fidgety/restless - 0 - -  Suicidal thoughts - 0 - -  PHQ-9 Score - 8 - -  Difficult doing work/chores - Very difficult - -    Review of Systems  Constitutional: Negative.   HENT: Negative.   Eyes: Positive for visual disturbance.  Respiratory: Negative.   Cardiovascular: Negative.   Gastrointestinal: Negative.   Endocrine: Negative.   Genitourinary: Negative.   Musculoskeletal: Positive for gait problem.  Skin: Negative.   Allergic/Immunologic: Negative.   Neurological: Positive for dizziness, tremors, light-headedness and numbness.       Tingling   Hematological: Negative.   Psychiatric/Behavioral: Negative.   All other systems reviewed and are negative.      Objective:   Physical Exam Gen: no distress, normal appearing HEENT: oral mucosa pink and moist, NCAT Cardio: Reg rate Chest: normal effort, normal rate of breathing Abd: soft, non-distended Ext: no edema Skin: intact Neuro: Alert and oriented x3. Left sided decreased sensation in upper, middle, and lower portion of face Psych: pleasant, normal affect    Assessment & Plan:  Mrs.  Fuller is an 79 year old woman who presents for follow-up of  left sided facial pain.   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 She does have a history of migraines.  -  reviewed patient's medical notes -Increase in Gabapentin to 300/300/600 made her too sleepy- return to 300mg  TID. She does not feel this is helping.  -encouraged anti-inflammatory diet.  -she had sinusitis that was viral and was treated with antibiotics. This was in the early 51s. The symptoms resolved after 2 months. She uses to get sinus infections and saline rinse.  -Will prescribe 100% oxygen via facemask to assess if this helps (then may be more of a cluster headache). Can also trial other headache medications. she has had at least 5 very severe headache attaches lasting 15-180 minutes when untreated, so this should be covered by medicare. I have called her oxygen company today to discuss this. Have not yet heard back from them.  -Discussed that unfortunately Sprint PNS is not indicated for craniofacial pain. -Amitriptyline and Lyrica did not help.  -Increase Cymbalta to 90mg .  -Provided with hand out with information regarding trigeminal neuralgia -She has not tried Phenytoin  -She would like to try biofeedback. -reviewed neurology note.   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  -she is a good metabolizer of amitriptyline and has tolerated nortrypilline in the past, but without much benefit.   3) AKI: encouraged hydration.   4) Hypernatremia: encouraged hydration  5) General health and pain: provided with list of healthy foods that are both nutritious and fight pain.   6) Depressed: Amitriptyline did help with this but she could not tolerate the drowsiness.   7) Fibromyalgia: She is on 30mg  of steroids per day for fibromyalgia. She is on that intermittently. She started this again about a month ago.  She has been on this about a year.  All questions answered. RTC in 1 month.

## 2020-03-14 NOTE — Patient Instructions (Signed)
Gabapentin to 300mg  BID today for three days, and then 300mg  at night for three days and then stop  Then Increased Cymbalta to 90mg .

## 2020-03-26 DIAGNOSIS — Z23 Encounter for immunization: Secondary | ICD-10-CM | POA: Diagnosis not present

## 2020-04-15 ENCOUNTER — Ambulatory Visit: Payer: Medicare Other | Admitting: Family Medicine

## 2020-04-15 ENCOUNTER — Other Ambulatory Visit: Payer: Self-pay

## 2020-04-15 ENCOUNTER — Encounter
Payer: Medicare Other | Attending: Physical Medicine and Rehabilitation | Admitting: Physical Medicine and Rehabilitation

## 2020-04-15 VITALS — BP 156/77 | HR 69 | Temp 98.3°F | Ht 63.0 in | Wt 153.4 lb

## 2020-04-15 DIAGNOSIS — G500 Trigeminal neuralgia: Secondary | ICD-10-CM

## 2020-04-15 DIAGNOSIS — M797 Fibromyalgia: Secondary | ICD-10-CM | POA: Diagnosis not present

## 2020-04-15 MED ORDER — NONFORMULARY OR COMPOUNDED ITEM: 1.0000 | Freq: Four times a day (QID) | 3 refills | Status: AC | PRN

## 2020-04-15 MED ORDER — BACLOFEN 10 MG PO TABS: 5.0000 mg | ORAL_TABLET | Freq: Three times a day (TID) | ORAL | 1 refills | Status: DC | PRN

## 2020-04-15 NOTE — Patient Instructions (Addendum)
Please ask your ophthalmologist about oxybuprocaine  Can consider CBD oil.   Ketamine 10%, Baclofen 2%, Cyclobenzaprine 2%, Ketoprofen 10%, Gabapentin 6%, Bupivacaine 1%, Amitryptiline 5%

## 2020-04-15 NOTE — Progress Notes (Signed)
Subjective:    Patient ID: [name], female    DOB: 12-23-1940, 79 y.o.   MRN: [id number]  HPI  Her pain continues to be in her left eye, forehead. She has not tried any topicals for pain. She uses capsaicin for her back and knees.   Since last visit nothing we have tried has helped with her pain. She has tried to decrease her Gabapentin dose but then her pain increased so she has resumed taking 4 Gabapentin per day.   Her husband asks whether he should contact her surgeon regarding whether nerve may have become unclamped.   Discussed prolotherapy, trigger point injections, Baclofen, Clonazepam, Phenytoin as other options we could try,=.  Pain continues to be excruciating and she states she is not sure how much longer she can live like this.   Prior history: She has having some nausea, lightheadedness and felt this may be related to her Gabapentin. She did not take any yesterday and then felt more sever pain in the evening. She also gets electrical shooting across her jaw.   She does not feel that she has cluster headaches as the pain has been so constant. She feels that she is getting worse. The oxygen made her feel relaxed but it is not helping.   The Sarna lotion does help with the itching in her head.   She was willing to try the Amitriptyline but it makes her too sleepy in the morning.   Her teeth and left eye incredibly hurt.   She feels that the Gabapentin has been making her dizziness.   She asks about biofeedback.   The pain is 7/10 on average, similar to last time.   When the pain is severe it can make her depressed.   Pain Inventory Average Pain 8 Pain Right Now 8 My pain is sharp, burning, stabbing, tingling and aching  In the last 24 hours, has pain interfered with the following? General activity 8 Relation with others 4 Enjoyment of life 0 What TIME of day is your pain at its worst? morning  and daytime Sleep (in general) Good  Pain is worse  with: bending and inactivity Pain improves with: rest, heat/ice and medication Relief from Meds: 2  Family History  Problem Relation Age of Onset  . Heart disease Mother   . Heart disease Father   . Stroke Father   . Stroke Sister   . Asthma Daughter   . Colon cancer Neg Hx    Social History   Socioeconomic History  . Marital status: Married    Spouse name: Not on file  . Number of children: 2  . Years of education: college  . Highest education level: Not on file  Occupational History  . Occupation: housewife    Employer: RETIRED  Tobacco Use  . Smoking status: Never Smoker  . Smokeless tobacco: Never Used  Vaping Use  . Vaping Use: Never used  Substance and Sexual Activity  . Alcohol use: No    Alcohol/week: 0.0 standard drinks  . Drug use: No  . Sexual activity: Not on file  Other Topics Concern  . Not on file  Social History Narrative   1 cup of Jasmine tea daily    Social Determinants of Health   Financial Resource Strain:   . Difficulty of Paying Living Expenses: Not on file  Food Insecurity:   . Worried About Charity fundraiser in the Last Year: Not on file  . Ran Out of  Food in the Last Year: Not on file  Transportation Needs:   . Lack of Transportation (Medical): Not on file  . Lack of Transportation (Non-Medical): Not on file  Physical Activity:   . Days of Exercise per Week: Not on file  . Minutes of Exercise per Session: Not on file  Stress:   . Feeling of Stress : Not on file  Social Connections:   . Frequency of Communication with Friends and Family: Not on file  . Frequency of Social Gatherings with Friends and Family: Not on file  . Attends Religious Services: Not on file  . Active Member of Clubs or Organizations: Not on file  . Attends Archivist Meetings: Not on file  . Marital Status: Not on file   Past Surgical History:  Procedure Laterality Date  . CHOLECYSTECTOMY  03/2010  . COLONOSCOPY  2009   Dr. Raford Pitcher   .  CYSTOSCOPY     Multiple Cystoscopies with stents or biopsy   . ESOPHAGOGASTRODUODENOSCOPY  10/2013  . Gamma knife radiation  01/2018   for trigeminal neuralgia  . TONSILLECTOMY     Past Surgical History:  Procedure Laterality Date  . CHOLECYSTECTOMY  03/2010  . COLONOSCOPY  2009   Dr. Raford Pitcher   . CYSTOSCOPY     Multiple Cystoscopies with stents or biopsy   . ESOPHAGOGASTRODUODENOSCOPY  10/2013  . Gamma knife radiation  01/2018   for trigeminal neuralgia  . TONSILLECTOMY     Past Medical History:  Diagnosis Date  . Adenomatous colon polyp 03/24/2018  . Allergy   . Arthritis   . Cataract   . Chronic headaches   . Depression   . Fibromyalgia   . Gallstones   . GERD (gastroesophageal reflux disease)   . Hyperlipemia   . Hypertension   . Interstitial cystitis   . Lactose intolerance   . LBBB (left bundle branch block)   . Osteoarthritis   . Osteopenia   . Pneumonia   . Thyroid disease   . Tinnitus   . Trigeminal neuralgia    There were no vitals taken for this visit.  Opioid Risk Score:   Fall Risk Score:  `1  Depression screen PHQ 2/9  Depression screen [facility] 2/9 02/14/2020 02/07/2020 06/30/2016 06/30/2016  Decreased Interest 0 3 0 0  Down, Depressed, Hopeless 0 2 0 0  PHQ - 2 Score 0 5 0 0  Altered sleeping - 0 - -  Tired, decreased energy - 3 - -  Change in appetite - 0 - -  Feeling bad or failure about yourself  - 0 - -  Trouble concentrating - 0 - -  Moving slowly or fidgety/restless - 0 - -  Suicidal thoughts - 0 - -  PHQ-9 Score - 8 - -  Difficult doing work/chores - Very difficult - -    Review of Systems  Constitutional: Negative.   HENT: Negative.   Eyes: Positive for visual disturbance.  Respiratory: Negative.   Cardiovascular: Negative.   Gastrointestinal: Negative.   Endocrine: Negative.   Genitourinary: Negative.   Musculoskeletal: Positive for gait problem.  Skin: Negative.   Allergic/Immunologic: Negative.   Neurological: Positive  for dizziness, tremors, light-headedness and numbness.       Tingling   Hematological: Negative.   Psychiatric/Behavioral: Negative.   All other systems reviewed and are negative.      Objective:   Physical Exam Gen: no distress, normal appearing HEENT: oral mucosa pink and moist, NCAT Cardio: Reg rate  Chest: normal effort, normal rate of breathing Abd: soft, non-distended Ext: no edema Skin: intact Neuro: Alert and oriented x3. Left sided decreased sensation in upper, middle, and lower portion of face Psych: pleasant, normal affect    Assessment & Plan:  Mrs. Kirsh is an 79 year old woman who presents for follow-up of  left sided facial pain.   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 She does have a history of migraines.  -reviewed patient's medical notes -Continue Gabapentin 300mg  QID. She has tried weaning off but pain increased.  -Trial Baclofen 5mg , and uptitrate as tolerated. Adised of side effects of drowsiness and hypotension.  -encouraged anti-inflammatory diet.  -she had sinusitis that was viral and was treated with antibiotics. This was in the early 91s. The symptoms resolved after 2 months. She uses to get sinus infections and saline rinse.  -She has had multiple root canals.  -Discontinue 100% oxygen via facemask as this was not helpful.  -Discussed that unfortunately Sprint PNS is not indicated for craniofacial pain. -Amitriptyline and Lyrica did not help.  -Increase Cymbalta to 90mg .  -Provided with hand out with information regarding trigeminal neuralgia -She has not tried Phenytoin  -She would like to try biofeedback. -reviewed neurology note.  -will try trigger point injections next visit.  -Sent Ketamine 10%, Baclofen 2%, Cyclobenzaprine 2%, Ketoprofen 10%, Gabapentin 6%, Bupivacaine 1%, Amitryptiline 5% to apply to painful areas.    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  -she is a good metabolizer of amitriptyline and has tolerated nortrypilline in the past, but without much benefit.   3) AKI: encouraged hydration.   4) Hypernatremia: encouraged hydration  5) General health and pain: provided with list of healthy foods that are both nutritious and fight pain.   6) Depressed: Amitriptyline did help with this but she could not tolerate the drowsiness. Encouraged focusing on the factors that she can control, such as anti-inflammatory diet.   7) Fibromyalgia: She is on 30mg  of steroids per day for fibromyalgia. She is on that intermittently. She started this again about a month ago. She has been on this about a year.  Ketamine 10%, Baclofen 2%, Cyclobenzaprine 2%, Ketoprofen 10%, Gabapentin 6%, Bupivacaine 1%, Amitryptiline 5%  All questions answered. RTC in 1 month.

## 2020-04-16 ENCOUNTER — Encounter: Payer: Self-pay | Admitting: Physical Medicine and Rehabilitation

## 2020-04-16 DIAGNOSIS — H353131 Nonexudative age-related macular degeneration, bilateral, early dry stage: Secondary | ICD-10-CM | POA: Diagnosis not present

## 2020-04-16 DIAGNOSIS — H02834 Dermatochalasis of left upper eyelid: Secondary | ICD-10-CM | POA: Diagnosis not present

## 2020-04-16 DIAGNOSIS — Z961 Presence of intraocular lens: Secondary | ICD-10-CM | POA: Diagnosis not present

## 2020-04-16 DIAGNOSIS — H18413 Arcus senilis, bilateral: Secondary | ICD-10-CM | POA: Diagnosis not present

## 2020-05-14 ENCOUNTER — Encounter: Payer: Self-pay | Admitting: Physical Medicine and Rehabilitation

## 2020-05-14 ENCOUNTER — Other Ambulatory Visit: Payer: Self-pay

## 2020-05-14 ENCOUNTER — Encounter
Payer: Medicare Other | Attending: Physical Medicine and Rehabilitation | Admitting: Physical Medicine and Rehabilitation

## 2020-05-14 VITALS — BP 152/72 | HR 73 | Temp 98.4°F | Ht 63.0 in | Wt 155.0 lb

## 2020-05-14 DIAGNOSIS — G500 Trigeminal neuralgia: Secondary | ICD-10-CM | POA: Insufficient documentation

## 2020-05-14 DIAGNOSIS — M7918 Myalgia, other site: Secondary | ICD-10-CM | POA: Insufficient documentation

## 2020-05-14 DIAGNOSIS — G518 Other disorders of facial nerve: Secondary | ICD-10-CM | POA: Diagnosis not present

## 2020-05-14 MED ORDER — GABAPENTIN 300 MG PO CAPS
300.0000 mg | ORAL_CAPSULE | Freq: Three times a day (TID) | ORAL | 3 refills | Status: DC
Start: 2020-05-14 — End: 2020-07-01

## 2020-05-14 MED ORDER — DULOXETINE HCL 30 MG PO CPEP
30.0000 mg | ORAL_CAPSULE | Freq: Two times a day (BID) | ORAL | 3 refills | Status: DC
Start: 2020-05-14 — End: 2021-05-13

## 2020-05-14 NOTE — Progress Notes (Signed)
Trigger Point Injection  Indication: Trigeminal neuralgia refractory to medication management and other conservative care.  Informed consent was obtained after describing risk and benefits of the procedure with the patient, this includes bleeding, bruising, infection and medication side effects.  The patient wishes to proceed and has given written consent.  The patient was placed in a seated position.  The trigeminal nerve area was marked and prepped with Betadine.  It was entered with a 25-gauge 1/2 inch needle and a total of 5 mL of 1% lidocaine and normal saline was injected into a total of 4 painful trigger points along the trigeminal nerve distribution, after negative draw back for blood.  The patient tolerated the procedure well.  Post procedure instructions were given. She noted reduction of facial pain and pain at trigger sites immediately following procedure.

## 2020-05-30 ENCOUNTER — Other Ambulatory Visit: Payer: Self-pay | Admitting: Physical Medicine and Rehabilitation

## 2020-05-30 MED ORDER — PHENYTOIN SODIUM EXTENDED 30 MG PO CAPS: 30.0000 mg | ORAL_CAPSULE | Freq: Three times a day (TID) | ORAL | 2 refills | Status: DC

## 2020-05-31 ENCOUNTER — Other Ambulatory Visit: Payer: Self-pay | Admitting: Physical Medicine and Rehabilitation

## 2020-05-31 DIAGNOSIS — Z961 Presence of intraocular lens: Secondary | ICD-10-CM | POA: Diagnosis not present

## 2020-05-31 MED ORDER — PHENYTOIN SODIUM EXTENDED 100 MG PO CAPS: 100.0000 mg | ORAL_CAPSULE | Freq: Every day | ORAL | 2 refills | Status: DC

## 2020-06-22 HISTORY — PX: REFRACTIVE SURGERY: SHX103

## 2020-06-26 ENCOUNTER — Other Ambulatory Visit: Payer: Self-pay

## 2020-06-26 ENCOUNTER — Encounter: Payer: Self-pay | Admitting: Physical Medicine and Rehabilitation

## 2020-06-26 ENCOUNTER — Encounter
Payer: Medicare Other | Attending: Physical Medicine and Rehabilitation | Admitting: Physical Medicine and Rehabilitation

## 2020-06-26 VITALS — BP 133/75 | HR 76 | Temp 97.8°F | Ht 63.0 in | Wt 155.4 lb

## 2020-06-26 DIAGNOSIS — N179 Acute kidney failure, unspecified: Secondary | ICD-10-CM | POA: Insufficient documentation

## 2020-06-26 DIAGNOSIS — G500 Trigeminal neuralgia: Secondary | ICD-10-CM | POA: Insufficient documentation

## 2020-06-26 NOTE — Progress Notes (Signed)
Subjective:    Patient ID: [name], female    DOB: 01-May-1941, 80 y.o.   MRN: [id number]  HPI  [name] presents today for follow-up of sever trigeminal neuralgia.  She has been almost nonfunctional due to the severity of her pain.  She has a sharp shooting pain doing around her eyelid and the tip teeth and in the top of her head.   She tried the Phenytoin for 2 days and did not feel benefit so stopped it.   She is now taking about 5 of the Gabapentin per day, 300mg . She sleeps until 11/11;30 in the morning.   She feels very sleepy during the day.   Prior history:  Her pain continues to be in her left eye, forehead. She has not tried any topicals for pain. She uses capsaicin for her back and knees.   Since last visit nothing we have tried has helped with her pain. She has tried to decrease her Gabapentin dose but then her pain increased so she has resumed taking 4 Gabapentin per day.   Her husband asks whether he should contact her surgeon regarding whether nerve may have become unclamped.   Discussed prolotherapy, trigger point injections, Baclofen, Clonazepam, Phenytoin as other options we could try,=.  Pain continues to be excruciating and she states she is not sure how much longer she can live like this.   Prior history: She has having some nausea, lightheadedness and felt this may be related to her Gabapentin. She did not take any yesterday and then felt more sever pain in the evening. She also gets electrical shooting across her jaw.   She does not feel that she has cluster headaches as the pain has been so constant. She feels that she is getting worse. The oxygen made her feel relaxed but it is not helping.   The Sarna lotion does help with the itching in her head.   She was willing to try the Amitriptyline but it makes her too sleepy in the morning.   Her teeth and left eye incredibly hurt.   She feels that the Gabapentin has been making her dizziness.    She asks about biofeedback.   The pain is 7/10 on average, similar to last time.   When the pain is severe it can make her depressed.   Pain Inventory Average Pain 8 Pain Right Now 8 My pain is sharp, burning, stabbing, tingling and aching  In the last 24 hours, has pain interfered with the following? General activity 8 Relation with others 4 Enjoyment of life 0 What TIME of day is your pain at its worst? morning  and daytime Sleep (in general) Good  Pain is worse with: bending and inactivity Pain improves with: rest, heat/ice and medication Relief from Meds: 2  Family History  Problem Relation Age of Onset  . Heart disease Mother   . Heart disease Father   . Stroke Father   . Stroke Sister   . Asthma Daughter   . Colon cancer Neg Hx    Social History   Socioeconomic History  . Marital status: Married    Spouse name: Not on file  . Number of children: 2  . Years of education: college  . Highest education level: Not on file  Occupational History  . Occupation: housewife    Employer: RETIRED  Tobacco Use  . Smoking status: Never Smoker  . Smokeless tobacco: Never Used  Vaping Use  . Vaping Use: Never used  Substance and Sexual Activity  . Alcohol use: No    Alcohol/week: 0.0 standard drinks  . Drug use: No  . Sexual activity: Not on file  Other Topics Concern  . Not on file  Social History Narrative   1 cup of Jasmine tea daily    Social Determinants of Health   Financial Resource Strain: Not on file  Food Insecurity: Not on file  Transportation Needs: Not on file  Physical Activity: Not on file  Stress: Not on file  Social Connections: Not on file   Past Surgical History:  Procedure Laterality Date  . CHOLECYSTECTOMY  03/2010  . COLONOSCOPY  2009   Dr. Raford Pitcher   . CYSTOSCOPY     Multiple Cystoscopies with stents or biopsy   . ESOPHAGOGASTRODUODENOSCOPY  10/2013  . Gamma knife radiation  01/2018   for trigeminal neuralgia  .  TONSILLECTOMY     Past Surgical History:  Procedure Laterality Date  . CHOLECYSTECTOMY  03/2010  . COLONOSCOPY  2009   Dr. Raford Pitcher   . CYSTOSCOPY     Multiple Cystoscopies with stents or biopsy   . ESOPHAGOGASTRODUODENOSCOPY  10/2013  . Gamma knife radiation  01/2018   for trigeminal neuralgia  . TONSILLECTOMY     Past Medical History:  Diagnosis Date  . Adenomatous colon polyp 03/24/2018  . Allergy   . Arthritis   . Cataract   . Chronic headaches   . Depression   . Fibromyalgia   . Gallstones   . GERD (gastroesophageal reflux disease)   . Hyperlipemia   . Hypertension   . Interstitial cystitis   . Lactose intolerance   . LBBB (left bundle branch block)   . Osteoarthritis   . Osteopenia   . Pneumonia   . Thyroid disease   . Tinnitus   . Trigeminal neuralgia    There were no vitals taken for this visit.  Opioid Risk Score:   Fall Risk Score:  `1  Depression screen PHQ 2/9  Depression screen [facility] 2/9 05/14/2020 02/14/2020 02/07/2020 06/30/2016 06/30/2016  Decreased Interest 0 0 3 0 0  Down, Depressed, Hopeless 0 0 2 0 0  PHQ - 2 Score 0 0 5 0 0  Altered sleeping - - 0 - -  Tired, decreased energy - - 3 - -  Change in appetite - - 0 - -  Feeling bad or failure about yourself  - - 0 - -  Trouble concentrating - - 0 - -  Moving slowly or fidgety/restless - - 0 - -  Suicidal thoughts - - 0 - -  PHQ-9 Score - - 8 - -  Difficult doing work/chores - - Very difficult - -    Review of Systems  Constitutional: Negative.   HENT: Negative.   Eyes: Positive for visual disturbance.  Respiratory: Negative.   Cardiovascular: Negative.   Gastrointestinal: Negative.   Endocrine: Negative.   Genitourinary: Negative.   Musculoskeletal: Positive for gait problem.  Skin: Negative.   Allergic/Immunologic: Negative.   Neurological: Positive for dizziness, tremors, light-headedness and numbness.       Tingling   Hematological: Negative.   Psychiatric/Behavioral:  Negative.   All other systems reviewed and are negative.      Objective:   Physical Exam Gen: no distress, normal appearing HEENT: oral mucosa pink and moist, NCAT Cardio: Reg rate Chest: normal effort, normal rate of breathing Abd: soft, non-distended Ext: no edema Neuro: Alert and oriented x3. Left sided decreased sensation in upper, middle,  and lower portion of face. Left sided facial droop. Psych: pleasant, normal affect    Assessment & Plan:  [name] is an 80 year old woman who presents for follow-up of  left sided facial pain.   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 She does have a history of migraines.  -reviewed patient's medical notes -Continue Gabapentin 300mg  5 times per day. She has tried weaning off but pain increased.  -Trial Baclofen 5mg , and uptitrate as tolerated. Adised of side effects of drowsiness and hypotension.  -encouraged anti-inflammatory diet.  -she had sinusitis that was viral and was treated with antibiotics. This was in the early 18s. The symptoms resolved after 2 months. She uses to get sinus infections and saline rinse.  -She has had multiple root canals.  -Discontinue 100% oxygen via facemask as this was not helpful.  -Discussed that unfortunately Sprint PNS is not indicated for craniofacial pain. -Amitriptyline and Lyrica did not help.  -Increase Cymbalta to 90mg .  -Provided with hand out with information regarding trigeminal neuralgia -Retry the Phenytoin.  -She would like to try biofeedback. -reviewed neurology note.  -will try trigger point injections next visit.  -Continue Ketamine 10%, Baclofen 2%, Cyclobenzaprine 2%, Ketoprofen 10%, Gabapentin 6%, Bupivacaine 1%, Amitryptiline 5% to apply to painful areas- using 3-4 times per day.  -Can consider Meloxicam if Phenytoin does not help.   Turmeric to reduce inflammation--can be used in cooking or taken as a  supplement.  Benefits of turmeric:  -Highly anti-inflammatory  -Increases antioxidants  -Improves memory, attention, brain disease  -Lowers risk of heart disease  -May help prevent cancer  -Decreases pain  -Alleviates depression  -Delays aging and decreases risk of chronic disease  -Consume with black pepper to increase absorption    Turmeric Milk Recipe:  1 cup milk  1 tsp turmeric  1 tsp cinnamon  1 tsp grated ginger (optional)  Black pepper (boosts the anti-inflammatory properties of turmeric).  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  -she is a good metabolizer of amitriptyline and has tolerated nortrypilline in the past, but without much benefit.   3) AKI: encouraged hydration. She states that she had AKI with Ibuprofen in the past. Discussed checking Cr level next visit if we are considering Meloxicam  4) Hypernatremia: encouraged hydration  5) General health and pain: provided with list of healthy foods that are both nutritious and fight pain.   6) Depressed: Amitriptyline did help with this but she could not tolerate the drowsiness. Encouraged focusing on the factors that she can control, such as anti-inflammatory diet.   7) Fibromyalgia: She is on 30mg  of steroids per day for fibromyalgia. She is on that intermittently. She started this again about a month ago. She has been on  this about a year.  All questions answered. RTC in 1 month.

## 2020-06-26 NOTE — Patient Instructions (Signed)
Turmeric to reduce inflammation--can be used in cooking or taken as a supplement.  Benefits of turmeric:  -Highly anti-inflammatory  -Increases antioxidants  -Improves memory, attention, brain disease  -Lowers risk of heart disease  -May help prevent cancer  -Decreases pain  -Alleviates depression  -Delays aging and decreases risk of chronic disease  -Consume with black pepper to increase absorption    Turmeric Milk Recipe:  1 cup milk  1 tsp turmeric  1 tsp cinnamon  1 tsp grated ginger (optional)  Black pepper (boosts the anti-inflammatory properties of turmeric).  1 tsp honey     Benefits of Ghee  -can be used in cooking, high smoke point  -high in fat soluble vitamins A, D, E, and K which are important for skin and vision, preventing leaky gut, strong bones  -free of lactose and casein  -contains conjugated linoleic acid, which can reduce body fat, prevent cancer, decrease inflammation, and lower blood pressure  -high in butyrate- helps support healthy insulin levels, decreases inflammation, decreases digestive problems, maintains healthy gut microbiome  -decreases pain and inflammation 

## 2020-07-01 ENCOUNTER — Other Ambulatory Visit: Payer: Self-pay | Admitting: *Deleted

## 2020-07-01 MED ORDER — BACLOFEN 10 MG PO TABS: 5.0000 mg | ORAL_TABLET | Freq: Three times a day (TID) | ORAL | 1 refills | Status: AC | PRN

## 2020-07-01 MED ORDER — GABAPENTIN 300 MG PO CAPS: 300.0000 mg | ORAL_CAPSULE | ORAL | 3 refills | Status: DC | PRN

## 2020-07-01 NOTE — Addendum Note (Signed)
Addended by: Izora Ribas on: 07/01/2020 07:03 PM   Modules accepted: Orders

## 2020-07-02 MED ORDER — GABAPENTIN 300 MG PO CAPS: 300.0000 mg | ORAL_CAPSULE | ORAL | 3 refills | Status: DC | PRN

## 2020-07-02 NOTE — Addendum Note (Signed)
Addended by: Izora Ribas on: 07/02/2020 08:59 AM   Modules accepted: Orders

## 2020-07-16 DIAGNOSIS — E039 Hypothyroidism, unspecified: Secondary | ICD-10-CM | POA: Diagnosis not present

## 2020-07-16 DIAGNOSIS — I10 Essential (primary) hypertension: Secondary | ICD-10-CM | POA: Diagnosis not present

## 2020-07-16 DIAGNOSIS — M150 Primary generalized (osteo)arthritis: Secondary | ICD-10-CM | POA: Diagnosis not present

## 2020-07-16 DIAGNOSIS — E7800 Pure hypercholesterolemia, unspecified: Secondary | ICD-10-CM | POA: Diagnosis not present

## 2020-07-16 DIAGNOSIS — M797 Fibromyalgia: Secondary | ICD-10-CM | POA: Diagnosis not present

## 2020-08-02 ENCOUNTER — Other Ambulatory Visit: Payer: Self-pay

## 2020-08-02 ENCOUNTER — Encounter: Payer: Self-pay | Admitting: Physical Medicine and Rehabilitation

## 2020-08-02 ENCOUNTER — Encounter
Payer: Medicare Other | Attending: Physical Medicine and Rehabilitation | Admitting: Physical Medicine and Rehabilitation

## 2020-08-02 VITALS — BP 139/74 | HR 73 | Temp 98.3°F | Ht 63.0 in | Wt 156.0 lb

## 2020-08-02 DIAGNOSIS — G500 Trigeminal neuralgia: Secondary | ICD-10-CM | POA: Insufficient documentation

## 2020-08-02 DIAGNOSIS — M797 Fibromyalgia: Secondary | ICD-10-CM | POA: Diagnosis not present

## 2020-08-02 MED ORDER — TOPIRAMATE 25 MG PO TABS: 25.0000 mg | ORAL_TABLET | Freq: Two times a day (BID) | ORAL | 1 refills | Status: DC

## 2020-08-02 NOTE — Progress Notes (Signed)
Subjective:    Patient ID: [name], female    DOB: March 23, 1941, 80 y.o.   MRN: [id number]  HPI  Mrs. Brazil presents today for follow-up of severe trigeminal neuralgia.   1) Severe trigeminal neuralgia: 2) Fibromyalgia: -She has been almost nonfunctional due to the severity of her pain. It continues to be very severe.   -She continues to have a sharp shooting pain doing around her eyelid and the tip teeth and in the top of her head.   -She tried the Phenytoin for 2 days and did not feel benefit so stopped it.   -She is now taking about 3-4 of the Gabapentin per day, 300mg . She sleeps from 11 to 10:30 in the morning.    -She feels very sleepy during the day.   -She has never tried Topirmate before.   Prior history:  Her pain continues to be in her left eye, forehead. She has not tried any topicals for pain. She uses capsaicin for her back and knees.   Since last visit nothing we have tried has helped with her pain. She has tried to decrease her Gabapentin dose but then her pain increased so she has resumed taking 4 Gabapentin per day.   Her husband asks whether he should contact her surgeon regarding whether nerve may have become unclamped.   Discussed prolotherapy, trigger point injections, Baclofen, Clonazepam, Phenytoin as other options we could try,=.  Pain continues to be excruciating and she states she is not sure how much longer she can live like this.   Prior history: She has having some nausea, lightheadedness and felt this may be related to her Gabapentin. She did not take any yesterday and then felt more sever pain in the evening. She also gets electrical shooting across her jaw.   She does not feel that she has cluster headaches as the pain has been so constant. She feels that she is getting worse. The oxygen made her feel relaxed but it is not helping.   The Sarna lotion does help with the itching in her head.   She was willing to try the Amitriptyline  but it makes her too sleepy in the morning.   Her teeth and left eye incredibly hurt.   She feels that the Gabapentin has been making her dizziness.   She asks about biofeedback.   The pain is 7/10 on average, similar to last time.   When the pain is severe it can make her depressed.   Pain Inventory Average Pain 9 Pain Right Now 9 My pain is sharp, burning, stabbing and aching  In the last 24 hours, has pain interfered with the following? General activity 8 Relation with others 6 Enjoyment of life 10 What TIME of day is your pain at its worst? daytime Sleep (in general) Good  Pain is worse with: bending Pain improves with: rest, heat/ice and medication Relief from Meds: 2  Family History  Problem Relation Age of Onset  . Heart disease Mother   . Heart disease Father   . Stroke Father   . Stroke Sister   . Asthma Daughter   . Colon cancer Neg Hx    Social History   Socioeconomic History  . Marital status: Married    Spouse name: Not on file  . Number of children: 2  . Years of education: college  . Highest education level: Not on file  Occupational History  . Occupation: housewife    Employer: RETIRED  Tobacco Use  .  Smoking status: Never Smoker  . Smokeless tobacco: Never Used  Vaping Use  . Vaping Use: Never used  Substance and Sexual Activity  . Alcohol use: No    Alcohol/week: 0.0 standard drinks  . Drug use: No  . Sexual activity: Not on file  Other Topics Concern  . Not on file  Social History Narrative   1 cup of Jasmine tea daily    Social Determinants of Health   Financial Resource Strain: Not on file  Food Insecurity: Not on file  Transportation Needs: Not on file  Physical Activity: Not on file  Stress: Not on file  Social Connections: Not on file   Past Surgical History:  Procedure Laterality Date  . CHOLECYSTECTOMY  03/2010  . COLONOSCOPY  2009   Dr. Raford Pitcher   . CYSTOSCOPY     Multiple Cystoscopies with stents or biopsy    . ESOPHAGOGASTRODUODENOSCOPY  10/2013  . Gamma knife radiation  01/2018   for trigeminal neuralgia  . TONSILLECTOMY     Past Surgical History:  Procedure Laterality Date  . CHOLECYSTECTOMY  03/2010  . COLONOSCOPY  2009   Dr. Raford Pitcher   . CYSTOSCOPY     Multiple Cystoscopies with stents or biopsy   . ESOPHAGOGASTRODUODENOSCOPY  10/2013  . Gamma knife radiation  01/2018   for trigeminal neuralgia  . TONSILLECTOMY     Past Medical History:  Diagnosis Date  . Adenomatous colon polyp 03/24/2018  . Allergy   . Arthritis   . Cataract   . Chronic headaches   . Depression   . Fibromyalgia   . Gallstones   . GERD (gastroesophageal reflux disease)   . Hyperlipemia   . Hypertension   . Interstitial cystitis   . Lactose intolerance   . LBBB (left bundle branch block)   . Osteoarthritis   . Osteopenia   . Pneumonia   . Thyroid disease   . Tinnitus   . Trigeminal neuralgia    There were no vitals taken for this visit.  Opioid Risk Score:   Fall Risk Score:  `1  Depression screen PHQ 2/9  Depression screen [facility] 2/9 06/26/2020 05/14/2020 02/14/2020 02/07/2020 06/30/2016 06/30/2016  Decreased Interest 0 0 0 3 0 0  Down, Depressed, Hopeless 0 0 0 2 0 0  PHQ - 2 Score 0 0 0 5 0 0  Altered sleeping - - - 0 - -  Tired, decreased energy - - - 3 - -  Change in appetite - - - 0 - -  Feeling bad or failure about yourself  - - - 0 - -  Trouble concentrating - - - 0 - -  Moving slowly or fidgety/restless - - - 0 - -  Suicidal thoughts - - - 0 - -  PHQ-9 Score - - - 8 - -  Difficult doing work/chores - - - Very difficult - -    Review of Systems  Constitutional: Negative.   HENT: Negative.   Eyes: Positive for visual disturbance.  Respiratory: Negative.   Cardiovascular: Negative.   Gastrointestinal: Negative.   Endocrine: Negative.   Genitourinary: Negative.   Musculoskeletal: Negative.   Skin: Negative.   Allergic/Immunologic: Negative.   Neurological: Positive for  dizziness, tremors, light-headedness and numbness.       Tingling   Hematological: Negative.   Psychiatric/Behavioral: Negative.   All other systems reviewed and are negative.      Objective:   Physical Exam Gen: no distress, normal appearing HEENT: oral mucosa pink  and moist, NCAT Cardio: Reg rate Chest: normal effort, normal rate of breathing Abd: soft, non-distended Ext: no edema Psych: pleasant, normal affect Skin: intact Neuro: Alert and oriented x3. Left sided decreased sensation in upper, middle, and lower portion of face. Left sided facial droop. Psych: pleasant, normal affect    Assessment & Plan:  Mrs. Allis is an 80 year old woman who presents for follow-up of left sided trigeminal neuralgia.   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 She does have a history of migraines.  -reviewed patient's medical notes -Continue Gabapentin 300mg  5 times per day. She has tried weaning off but pain increased.  -Trial Baclofen 5mg , and uptitrate as tolerated. Adised of side effects of drowsiness and hypotension.  -encouraged anti-inflammatory diet.  -she had sinusitis that was viral and was treated with antibiotics. This was in the early 28s. The symptoms resolved after 2 months. She uses to get sinus infections and saline rinse.  -She has had multiple root canals.  -Discontinue 100% oxygen via facemask as this was not helpful.  -Discussed that unfortunately Sprint PNS is not indicated for craniofacial pain. -Amitriptyline and Lyrica did not help.  -Increase Cymbalta to 90mg .  -Provided with hand out with information regarding trigeminal neuralgia -Retry the Phenytoin.  -She would like to try biofeedback. -reviewed neurology note.  -will try trigger point injections next visit.  -Continue Ketamine 10%, Baclofen 2%, Cyclobenzaprine 2%, Ketoprofen 10%, Gabapentin 6%, Bupivacaine 1%, Amitryptiline 5% to apply  to painful areas- using 3-4 times per day.  -Can consider Meloxicam if Phenytoin does not help.  -Continue Gabapentin 3-4 times per day. This medication does make her sleepy. Discussed that our goal is to wean off this medication once we find a medication that woks better for her. -Start Topiramate 25mg  BID. Let me know in 3 days if this is helping or not.    Turmeric to reduce inflammation--can be used in cooking or taken as a supplement.  Benefits of turmeric:  -Highly anti-inflammatory  -Increases antioxidants  -Improves memory, attention, brain disease  -Lowers risk of heart disease  -May help prevent cancer  -Decreases pain  -Alleviates depression  -Delays aging and decreases risk of chronic disease  -Consume with black pepper to increase absorption    Turmeric Milk Recipe:  1 cup milk  1 tsp turmeric  1 tsp cinnamon  1 tsp grated ginger (optional)  Black pepper (boosts the anti-inflammatory properties of turmeric).  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  -she is a good metabolizer of amitriptyline and has tolerated nortrypilline in the past, but without much benefit.   3) AKI: encouraged hydration. She states that she had AKI with Ibuprofen in the past. Discussed checking Cr level next visit if we are considering Meloxicam  4) Hypernatremia: encouraged hydration  5) General  health and pain: provided with list of healthy foods that are both nutritious  and fight pain.   6) Depressed: Amitriptyline did help with this but she could not tolerate the drowsiness. Encouraged focusing on the factors that she can control, such as anti-inflammatory diet.   7) Fibromyalgia: Continue 30mg  of steroids per day for fibromyalgia. She is on that intermittently. She started this again about a month ago. She has been on this about a year.  All questions answered. RTC in 1 month.

## 2020-08-08 DIAGNOSIS — G500 Trigeminal neuralgia: Secondary | ICD-10-CM | POA: Diagnosis not present

## 2020-08-12 DIAGNOSIS — M170 Bilateral primary osteoarthritis of knee: Secondary | ICD-10-CM | POA: Diagnosis not present

## 2020-08-12 DIAGNOSIS — M8580 Other specified disorders of bone density and structure, unspecified site: Secondary | ICD-10-CM | POA: Diagnosis not present

## 2020-08-12 DIAGNOSIS — M25559 Pain in unspecified hip: Secondary | ICD-10-CM | POA: Diagnosis not present

## 2020-08-12 DIAGNOSIS — M25562 Pain in left knee: Secondary | ICD-10-CM | POA: Diagnosis not present

## 2020-08-12 DIAGNOSIS — M150 Primary generalized (osteo)arthritis: Secondary | ICD-10-CM | POA: Diagnosis not present

## 2020-08-12 DIAGNOSIS — M797 Fibromyalgia: Secondary | ICD-10-CM | POA: Diagnosis not present

## 2020-08-12 DIAGNOSIS — M353 Polymyalgia rheumatica: Secondary | ICD-10-CM | POA: Diagnosis not present

## 2020-09-10 ENCOUNTER — Encounter
Payer: Medicare Other | Attending: Physical Medicine and Rehabilitation | Admitting: Physical Medicine and Rehabilitation

## 2020-09-10 ENCOUNTER — Encounter: Payer: Self-pay | Admitting: Physical Medicine and Rehabilitation

## 2020-09-10 ENCOUNTER — Other Ambulatory Visit: Payer: Self-pay

## 2020-09-10 VITALS — BP 124/81 | HR 72 | Temp 98.0°F | Ht 63.0 in | Wt 154.0 lb

## 2020-09-10 DIAGNOSIS — G500 Trigeminal neuralgia: Secondary | ICD-10-CM | POA: Insufficient documentation

## 2020-09-10 MED ORDER — B-6 500 MG PO TABS: 1.0000 | ORAL_TABLET | Freq: Every day | ORAL | 0 refills | Status: DC

## 2020-09-10 NOTE — Progress Notes (Signed)
Subjective:    Patient ID: [name], female    DOB: [birth year], 80 y.o.   MRN: [id number]  HPI  [name] presents today for follow-up of severe trigeminal neuralgia.   1) Severe trigeminal neuralgia: 2) Fibromyalgia: -She has been almost nonfunctional due to the severity of her pain. It continues to be very severe.  -She would like to wean off the Gabapentin as she feels at risk of falls -she is taking Topamax BID.   -She continues to have a sharp shooting pain doing around her eyelid and the tip teeth and in the top of her head.   -She tried the Phenytoin for 2 days and did not feel benefit so stopped it.   -She is now taking about 3-4 of the Gabapentin per day, 300mg . She sleeps from 11 to 10:30 in the morning.    -She feels very sleepy during the day.   -She has never tried Topirmate before.   Prior history:  Her pain continues to be in her left eye, forehead. She has not tried any topicals for pain. She uses capsaicin for her back and knees.   Since last visit nothing we have tried has helped with her pain. She has tried to decrease her Gabapentin dose but then her pain increased so she has resumed taking 4 Gabapentin per day.   Her husband asks whether he should contact her surgeon regarding whether nerve may have become unclamped.   Discussed prolotherapy, trigger point injections, Baclofen, Clonazepam, Phenytoin as other options we could try,=.  Pain continues to be excruciating and she states she is not sure how much longer she can live like this.   Prior history: She has having some nausea, lightheadedness and felt this may be related to her Gabapentin. She did not take any yesterday and then felt more sever pain in the evening. She also gets electrical shooting across her jaw.   She does not feel that she has cluster headaches as the pain has been so constant. She feels that she is getting worse. The oxygen made her feel relaxed but it is not helping.    The Sarna lotion does help with the itching in her head.   She was willing to try the Amitriptyline but it makes her too sleepy in the morning.   Her teeth and left eye incredibly hurt.   She feels that the Gabapentin has been making her dizziness.   She asks about biofeedback.   The pain is 7/10 on average, similar to last time.   When the pain is severe it can make her depressed.   Pain Inventory Average Pain 10 Pain Right Now 10 My pain is sharp, burning, dull, stabbing, tingling and aching  In the last 24 hours, has pain interfered with the following? General activity 10 Relation with others 10 Enjoyment of life 0 What TIME of day is your pain at its worst? daytime Sleep (in general) Good  Pain is worse with: bending and standing Pain improves with: rest and heat/ice Relief from Meds: 0  Family History  Problem Relation Age of Onset  . Heart disease Mother   . Heart disease Father   . Stroke Father   . Stroke Sister   . Asthma Daughter   . Colon cancer Neg Hx    Social History   Socioeconomic History  . Marital status: Married    Spouse name: Not on file  . Number of children: 2  . Years of education:  college  . Highest education level: Not on file  Occupational History  . Occupation: housewife    Employer: RETIRED  Tobacco Use  . Smoking status: Never Smoker  . Smokeless tobacco: Never Used  Vaping Use  . Vaping Use: Never used  Substance and Sexual Activity  . Alcohol use: No    Alcohol/week: 0.0 standard drinks  . Drug use: No  . Sexual activity: Not on file  Other Topics Concern  . Not on file  Social History Narrative   1 cup of Jasmine tea daily    Social Determinants of Health   Financial Resource Strain: Not on file  Food Insecurity: Not on file  Transportation Needs: Not on file  Physical Activity: Not on file  Stress: Not on file  Social Connections: Not on file   Past Surgical History:  Procedure Laterality Date  .  CHOLECYSTECTOMY  03/2010  . COLONOSCOPY  2009   Dr. Raford Pitcher   . CYSTOSCOPY     Multiple Cystoscopies with stents or biopsy   . ESOPHAGOGASTRODUODENOSCOPY  10/2013  . Gamma knife radiation  01/2018   for trigeminal neuralgia  . TONSILLECTOMY     Past Surgical History:  Procedure Laterality Date  . CHOLECYSTECTOMY  03/2010  . COLONOSCOPY  2009   Dr. Raford Pitcher   . CYSTOSCOPY     Multiple Cystoscopies with stents or biopsy   . ESOPHAGOGASTRODUODENOSCOPY  10/2013  . Gamma knife radiation  01/2018   for trigeminal neuralgia  . TONSILLECTOMY     Past Medical History:  Diagnosis Date  . Adenomatous colon polyp 03/24/2018  . Allergy   . Arthritis   . Cataract   . Chronic headaches   . Depression   . Fibromyalgia   . Gallstones   . GERD (gastroesophageal reflux disease)   . Hyperlipemia   . Hypertension   . Interstitial cystitis   . Lactose intolerance   . LBBB (left bundle branch block)   . Osteoarthritis   . Osteopenia   . Pneumonia   . Thyroid disease   . Tinnitus   . Trigeminal neuralgia    BP 124/81   Pulse 72   Temp 98 F (36.7 C)   Ht 5\' 3"  (1.6 m)   Wt 154 lb (69.9 kg)   SpO2 97%   BMI 27.28 kg/m   Opioid Risk Score:   Fall Risk Score:  `1  Depression screen PHQ 2/9  Depression screen [facility] 2/9 06/26/2020 05/14/2020 02/14/2020 02/07/2020 06/30/2016 06/30/2016  Decreased Interest 0 0 0 3 0 0  Down, Depressed, Hopeless 0 0 0 2 0 0  PHQ - 2 Score 0 0 0 5 0 0  Altered sleeping - - - 0 - -  Tired, decreased energy - - - 3 - -  Change in appetite - - - 0 - -  Feeling bad or failure about yourself  - - - 0 - -  Trouble concentrating - - - 0 - -  Moving slowly or fidgety/restless - - - 0 - -  Suicidal thoughts - - - 0 - -  PHQ-9 Score - - - 8 - -  Difficult doing work/chores - - - Very difficult - -    Review of Systems  Constitutional: Negative.   HENT: Negative.   Eyes: Positive for visual disturbance.  Respiratory: Negative.   Cardiovascular:  Negative.   Gastrointestinal: Negative.   Endocrine: Negative.   Genitourinary: Negative.   Musculoskeletal: Negative.   Skin: Negative.  Allergic/Immunologic: Negative.   Neurological: Positive for dizziness, tremors, light-headedness and numbness.       Tingling   Hematological: Negative.   Psychiatric/Behavioral: Negative.   All other systems reviewed and are negative.      Objective:   Physical Exam Gen: no distress, normal appearing HEENT: oral mucosa pink and moist, NCAT Cardio: Reg rate Chest: normal effort, normal rate of breathing Abd: soft, non-distended Ext: no edema Psych: pleasant, normal affect Skin: intact Neuro: Alert and oriented x3. Left sided decreased sensation in upper, middle, and lower portion of face. Left sided facial droop. Psych: pleasant, normal affect    Assessment & Plan:  [name] is an 80 year old woman who presents for follow-up of left sided trigeminal neuralgia.   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 She does have a history of migraines.  -reviewed patient's medical notes -Discussed wean off Gabapentin -Increase Topamax to 50mg  BID -refer to [facility] for trigeminal nerve block -Trial Baclofen 5mg , and uptitrate as tolerated. Adised of side effects of drowsiness and hypotension.  -encouraged anti-inflammatory diet.  -she had sinusitis that was viral and was treated with antibiotics. This was in the early 49s. The symptoms resolved after 2 months. She uses to get sinus infections and saline rinse.  -She has had multiple root canals.  -Discontinue 100% oxygen via facemask as this was not helpful.  -Discussed that unfortunately Sprint PNS is not indicated for craniofacial pain. -Amitriptyline and Lyrica did not help.  -Increase Cymbalta to 90mg .  -Provided with hand out with information regarding trigeminal neuralgia -Retry the Phenytoin.  -She would like  to try biofeedback. -reviewed neurology note.  -will try trigger point injections next visit.  -Continue Ketamine 10%, Baclofen 2%, Cyclobenzaprine 2%, Ketoprofen 10%, Gabapentin 6%, Bupivacaine 1%, Amitryptiline 5% to apply to painful areas- using 3-4 times per day.  -Can consider Meloxicam if Phenytoin does not help.  -Discussed Qutenza as an option for neuropathic pain control. Discussed that this is a capsaicin patch, stronger than capsaicin cream. Discussed that it is currently approved for diabetic peripheral neuropathy and post-herpetic neuralgia, but that it has also shown benefit in treating other forms of neuropathy. Provided patient with link to site to learn more about the patch: [employer].fr. Discussed that the patch would be placed in office and benefits usually last 3 months. Discussed that unintended exposure to capsaicin can cause severe irritation of eyes, mucous membranes, respiratory tract, and skin, but that Qutenza is a local treatment and does not have the systemic side effects of other nerve medications. Discussed that there may be pain, itching, erythema, and decreased sensory function associated with the application of Qutenza. Side effects usually subside within 1 week. A cold pack of analgesic medications can help with these side effects. Blood pressure can also be increased due to pain associated with administration of the patch.   Turmeric to reduce inflammation-can be used in cooking or taken as a supplement.  Benefits of turmeric:  -Highly anti-inflammatory  -Increases antioxidants  -Improves memory, attention, brain disease  -Lowers risk of heart disease  -May help prevent cancer  -Decreases pain  -Alleviates depression  -Delays aging and decreases risk of chronic disease  -Consume with black pepper to increase absorption    Turmeric Milk Recipe:  1 cup milk  1 tsp turmeric  1 tsp cinnamon  1 tsp grated ginger (optional)  Black  pepper (boosts the anti-inflammatory properties of  turmeric).  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  -she is a good metabolizer of amitriptyline and has tolerated nortrypilline in the past, but without much benefit.   3) AKI: encouraged hydration. She states that she had AKI with Ibuprofen in the past. Discussed checking Cr level next visit if we are considering Meloxicam  4) Hypernatremia: encouraged hydration  5) General health and pain: provided with list of healthy foods that are both nutritious and fight pain.   6) Depressed: Amitriptyline did help with this but she could not tolerate the drowsiness. Encouraged focusing on the factors that she can control, such as anti-inflammatory diet.   7) Fibromyalgia: Continue 30mg  of steroids per day for fibromyalgia. She is on that intermittently. She started this again about a month ago. She has been on this about a year.  All questions answered. RTC in 1 month.

## 2020-09-10 NOTE — Patient Instructions (Addendum)
Gabapentin 300mg  three times per day Decrease to 300mg  twice per day for three days Decrease to 300mg  only for three nights

## 2020-09-24 DIAGNOSIS — G500 Trigeminal neuralgia: Secondary | ICD-10-CM | POA: Diagnosis not present

## 2020-09-24 DIAGNOSIS — I10 Essential (primary) hypertension: Secondary | ICD-10-CM | POA: Diagnosis not present

## 2020-09-24 DIAGNOSIS — G501 Atypical facial pain: Secondary | ICD-10-CM | POA: Diagnosis not present

## 2020-09-24 DIAGNOSIS — Z6827 Body mass index (BMI) 27.0-27.9, adult: Secondary | ICD-10-CM | POA: Diagnosis not present

## 2020-09-24 DIAGNOSIS — M5481 Occipital neuralgia: Secondary | ICD-10-CM | POA: Diagnosis not present

## 2020-09-26 ENCOUNTER — Telehealth: Payer: Self-pay | Admitting: Orthopedic Surgery

## 2020-09-26 NOTE — Telephone Encounter (Signed)
Called patient. Last gel inj given.01/2020.   Please submit for bil knee inj's -Dr. Marlou Sa.

## 2020-09-26 NOTE — Telephone Encounter (Signed)
Patient called asked when is she due for gel injections again. Patient said she get the injections in both knees. The number to contact patient is [phone]

## 2020-10-02 DIAGNOSIS — N3010 Interstitial cystitis (chronic) without hematuria: Secondary | ICD-10-CM | POA: Diagnosis not present

## 2020-10-02 DIAGNOSIS — N3946 Mixed incontinence: Secondary | ICD-10-CM | POA: Diagnosis not present

## 2020-10-15 ENCOUNTER — Other Ambulatory Visit: Payer: Self-pay | Admitting: Physical Medicine and Rehabilitation

## 2020-10-15 DIAGNOSIS — G501 Atypical facial pain: Secondary | ICD-10-CM | POA: Diagnosis not present

## 2020-10-15 DIAGNOSIS — M5481 Occipital neuralgia: Secondary | ICD-10-CM | POA: Diagnosis not present

## 2020-10-28 ENCOUNTER — Encounter
Payer: Medicare Other | Attending: Physical Medicine and Rehabilitation | Admitting: Physical Medicine and Rehabilitation

## 2020-10-28 ENCOUNTER — Encounter: Payer: Self-pay | Admitting: Physical Medicine and Rehabilitation

## 2020-10-28 ENCOUNTER — Other Ambulatory Visit: Payer: Self-pay

## 2020-10-28 VITALS — BP 147/72 | HR 71 | Temp 97.8°F | Ht 63.0 in | Wt 154.6 lb

## 2020-10-28 DIAGNOSIS — Z85828 Personal history of other malignant neoplasm of skin: Secondary | ICD-10-CM | POA: Diagnosis not present

## 2020-10-28 DIAGNOSIS — G500 Trigeminal neuralgia: Secondary | ICD-10-CM | POA: Diagnosis not present

## 2020-10-28 DIAGNOSIS — L814 Other melanin hyperpigmentation: Secondary | ICD-10-CM | POA: Diagnosis not present

## 2020-10-28 DIAGNOSIS — M797 Fibromyalgia: Secondary | ICD-10-CM | POA: Diagnosis not present

## 2020-10-28 DIAGNOSIS — D2261 Melanocytic nevi of right upper limb, including shoulder: Secondary | ICD-10-CM | POA: Diagnosis not present

## 2020-10-28 DIAGNOSIS — L821 Other seborrheic keratosis: Secondary | ICD-10-CM | POA: Diagnosis not present

## 2020-10-28 DIAGNOSIS — D2262 Melanocytic nevi of left upper limb, including shoulder: Secondary | ICD-10-CM | POA: Diagnosis not present

## 2020-10-28 DIAGNOSIS — D225 Melanocytic nevi of trunk: Secondary | ICD-10-CM | POA: Diagnosis not present

## 2020-10-28 DIAGNOSIS — D485 Neoplasm of uncertain behavior of skin: Secondary | ICD-10-CM | POA: Diagnosis not present

## 2020-10-28 NOTE — Patient Instructions (Signed)
Trigeminal nerve ablation

## 2020-10-28 NOTE — Progress Notes (Signed)
Subjective:    Patient ID: [name], female    DOB: 02-20-1941, 80 y.o.   MRN: [id number]  HPI  [name] presents today for f/u of severe trigeminal neuralgia.   1) Severe trigeminal neuralgia: -saw pain management and received two injections- the second provided a couple of weeks of benefit. -she has an appointment with him in June that will give three months relief. -during the two weeks of pain benefit her pain went from 10 to 7.  -she did find benefit from the compound cream.  -She continues to have a sharp shooting pain doing around her eyelid and the tip teeth and in the top of her head.   2) Fibromyalgia: -She has been almost nonfunctional due to the severity of her pain. It continues to be very severe.  -She would like to wean off the Gabapentin as she feels at risk of falls -she is taking Topamax BID. She felt this was helpful but made her unsteady on her feet.   3) Osteoporosis: -husband asks whether she should get a Prolia shot.   -She tried the Phenytoin for 2 days and did not feel benefit so stopped it.   -She is now taking about 3-4 of the Gabapentin per day, 300mg . She sleeps from 11 to 10:30 in the morning.  She is still taking this.   -She feels very sleepy during the day.   -She has never tried Topirmate before.   Prior history:  Her pain continues to be in her left eye, forehead. She has not tried any topicals for pain. She uses capsaicin for her back and knees.   Since last visit nothing we have tried has helped with her pain. She has tried to decrease her Gabapentin dose but then her pain increased so she has resumed taking 4 Gabapentin per day.   Her husband asks whether he should contact her surgeon regarding whether nerve may have become unclamped.   Discussed prolotherapy, trigger point injections, Baclofen, Clonazepam, Phenytoin as other options we could try,=.  Pain continues to be excruciating and she states she is not sure how much  longer she can live like this.   Prior history: She has having some nausea, lightheadedness and felt this may be related to her Gabapentin. She did not take any yesterday and then felt more sever pain in the evening. She also gets electrical shooting across her jaw.   She does not feel that she has cluster headaches as the pain has been so constant. She feels that she is getting worse. The oxygen made her feel relaxed but it is not helping.   The Sarna lotion does help with the itching in her head.   She was willing to try the Amitriptyline but it makes her too sleepy in the morning.   Her teeth and left eye incredibly hurt.   She feels that the Gabapentin has been making her dizziness.   She asks about biofeedback.   The pain is 7/10 on average, similar to last time.   When the pain is severe it can make her depressed.   Pain Inventory Average Pain 8 Pain Right Now 9 My pain is sharp, burning, dull, stabbing, tingling and aching  In the last 24 hours, has pain interfered with the following? General activity 7 Relation with others 4 Enjoyment of life 10 What TIME of day is your pain at its worst? daytime Sleep (in general) Good  Pain is worse with: bending Pain improves with:  rest, heat/ice and injections Relief from Meds: 5  Family History  Problem Relation Age of Onset  . Heart disease Mother   . Heart disease Father   . Stroke Father   . Stroke Sister   . Asthma Daughter   . Colon cancer Neg Hx    Social History   Socioeconomic History  . Marital status: Married    Spouse name: Not on file  . Number of children: 2  . Years of education: college  . Highest education level: Not on file  Occupational History  . Occupation: housewife    Employer: RETIRED  Tobacco Use  . Smoking status: Never Smoker  . Smokeless tobacco: Never Used  Vaping Use  . Vaping Use: Never used  Substance and Sexual Activity  . Alcohol use: No    Alcohol/week: 0.0 standard drinks   . Drug use: No  . Sexual activity: Not on file  Other Topics Concern  . Not on file  Social History Narrative   1 cup of Jasmine tea daily    Social Determinants of Health   Financial Resource Strain: Not on file  Food Insecurity: Not on file  Transportation Needs: Not on file  Physical Activity: Not on file  Stress: Not on file  Social Connections: Not on file   Past Surgical History:  Procedure Laterality Date  . CHOLECYSTECTOMY  03/2010  . COLONOSCOPY  2009   Dr. Raford Pitcher   . CYSTOSCOPY     Multiple Cystoscopies with stents or biopsy   . ESOPHAGOGASTRODUODENOSCOPY  10/2013  . Gamma knife radiation  01/2018   for trigeminal neuralgia  . TONSILLECTOMY     Past Surgical History:  Procedure Laterality Date  . CHOLECYSTECTOMY  03/2010  . COLONOSCOPY  2009   Dr. Raford Pitcher   . CYSTOSCOPY     Multiple Cystoscopies with stents or biopsy   . ESOPHAGOGASTRODUODENOSCOPY  10/2013  . Gamma knife radiation  01/2018   for trigeminal neuralgia  . TONSILLECTOMY     Past Medical History:  Diagnosis Date  . Adenomatous colon polyp 03/24/2018  . Allergy   . Arthritis   . Cataract   . Chronic headaches   . Depression   . Fibromyalgia   . Gallstones   . GERD (gastroesophageal reflux disease)   . Hyperlipemia   . Hypertension   . Interstitial cystitis   . Lactose intolerance   . LBBB (left bundle branch block)   . Osteoarthritis   . Osteopenia   . Pneumonia   . Thyroid disease   . Tinnitus   . Trigeminal neuralgia    There were no vitals taken for this visit.  Opioid Risk Score:   Fall Risk Score:  `1  Depression screen PHQ 2/9  Depression screen [facility] 2/9 06/26/2020 05/14/2020 02/14/2020 02/07/2020 06/30/2016 06/30/2016  Decreased Interest 0 0 0 3 0 0  Down, Depressed, Hopeless 0 0 0 2 0 0  PHQ - 2 Score 0 0 0 5 0 0  Altered sleeping - - - 0 - -  Tired, decreased energy - - - 3 - -  Change in appetite - - - 0 - -  Feeling bad or failure about yourself  - -  - 0 - -  Trouble concentrating - - - 0 - -  Moving slowly or fidgety/restless - - - 0 - -  Suicidal thoughts - - - 0 - -  PHQ-9 Score - - - 8 - -  Difficult doing work/chores - - -  Very difficult - -    Review of Systems  Constitutional: Negative.   HENT: Negative.   Eyes: Positive for visual disturbance.  Respiratory: Negative.   Cardiovascular: Negative.   Gastrointestinal: Negative.   Endocrine: Negative.   Genitourinary: Negative.   Musculoskeletal: Negative.   Skin: Negative.   Allergic/Immunologic: Negative.   Neurological: Positive for dizziness, tremors, light-headedness and numbness.       Tingling   Hematological: Negative.   Psychiatric/Behavioral: Negative.   All other systems reviewed and are negative.      Objective:   Physical Exam Gen: no distress, normal appearing HEENT: oral mucosa pink and moist, NCAT Cardio: Reg rate Chest: normal effort, normal rate of breathing Abd: soft, non-distended Ext: no edema Psych: pleasant, normal affect Skin: intact Neuro: Alert and oriented x3. Left sided decreased sensation in upper, middle, and lower portion of face. Left sided facial droop. Psych: pleasant, normal affect    Assessment & Plan:  [name] is an 80 year old woman who presents for f/u of left sided trigeminal neuralgia.   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 She does have a history of migraines.  -reviewed patient's medical notes -Discussed wean off Gabapentin -Increase Topamax to 50mg  BID -continue to follow with [facility] for trigeminal nerve block -Trial Baclofen 5mg , and uptitrate as tolerated. Adised of side effects of drowsiness and hypotension.  -encouraged anti-inflammatory diet.  -she had sinusitis that was viral and was treated with antibiotics. This was in the early 49s. The symptoms resolved after 2 months. She uses to get sinus infections and saline rinse.   -She has had multiple root canals.  -Discontinue 100% oxygen via facemask as this was not helpful.  -Discussed that unfortunately Sprint PNS is not indicated for craniofacial pain. -Amitriptyline and Lyrica did not help.  -Increase Cymbalta to 90mg .  -Topiramate helped but caused dizziness.  -Provided with hand out with information regarding trigeminal neuralgia -Retry the Phenytoin.  -She would like to try biofeedback. -reviewed neurology note.  -will try trigger point injections next visit.  -Continue Ketamine 10%, Baclofen 2%, Cyclobenzaprine 2%, Ketoprofen 10%, Gabapentin 6%, Bupivacaine 1%, Amitryptiline 5% to apply to painful areas- using 3-4 times per day.  -Can consider Meloxicam if Phenytoin does not help.  -Discussed Qutenza as an option for neuropathic pain control. Discussed that this is a capsaicin patch, stronger than capsaicin cream. Discussed that it is currently approved for diabetic peripheral neuropathy and post-herpetic neuralgia, but that it has also shown benefit in treating other forms of neuropathy. Provided patient with link to site to learn more about the patch: [employer]fr. Discussed that the patch would be placed in office and benefits usually last 3 months. Discussed that unintended exposure to capsaicin can cause severe irritation of eyes, mucous membranes, respiratory tract, and skin, but that Qutenza is a local treatment and does not have the systemic side effects of other nerve medications. Discussed that there may be pain, itching, erythema, and decreased sensory function associated with the application of Qutenza. Side effects usually subside within 1 week. A cold pack of analgesic medications can help with these side effects. Blood pressure can also be increased due to pain associated with administration of the patch.   Turmeric to reduce inflammation--can be used in cooking or taken as a supplement.  Benefits of turmeric:  -Highly  anti-inflammatory  -Increases antioxidants  -Improves memory, attention, brain disease  -Lowers risk of heart  disease  -May help prevent cancer  -Decreases pain  -Alleviates depression  -Delays aging and decreases risk of chronic disease  -Consume with black pepper to increase absorption    Turmeric Milk Recipe:  1 cup milk  1 tsp turmeric  1 tsp cinnamon  1 tsp grated ginger (optional)  Black pepper (boosts the anti-inflammatory properties of turmeric).  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  -she is a good metabolizer of amitriptyline and has tolerated nortrypilline in the past, but without much benefit.   3) AKI: encouraged hydration. She states that she had AKI with Ibuprofen in the past. Discussed checking Cr level next visit if we are considering Meloxicam  4) Hypernatremia: encouraged hydration  5) General health and pain: provided with list of healthy foods that are both nutritious and fight pain.   6) Depressed: Amitriptyline did help with this but she could not tolerate the drowsiness. Encouraged focusing on the factors that she can control, such as anti-inflammatory diet.   7) Fibromyalgia: Continue 30mg  of steroids per day for fibromyalgia. She is on that intermittently. She started this again about a month ago. She has been on this about a year.  All questions answered.  RTC in 1 month.

## 2020-10-30 ENCOUNTER — Telehealth: Payer: Self-pay

## 2020-10-30 NOTE — Telephone Encounter (Signed)
Can we get auth for gel injection?  

## 2020-10-31 ENCOUNTER — Telehealth: Payer: Self-pay

## 2020-10-31 NOTE — Telephone Encounter (Signed)
Noted  

## 2020-10-31 NOTE — Telephone Encounter (Signed)
VOB has been submitted for SynivscOne, bilateral knee. Pending BV.

## 2020-11-01 ENCOUNTER — Telehealth: Payer: Self-pay

## 2020-11-01 NOTE — Telephone Encounter (Signed)
Approved for SynviscOne, bilateral knee. [place] deductible has been met Covered at 100% through [facility] after Medicare pays No Co-pay No PA required  Appt. 11/11/2020 with Dr. Marlou Sa

## 2020-11-11 ENCOUNTER — Ambulatory Visit (INDEPENDENT_AMBULATORY_CARE_PROVIDER_SITE_OTHER): Payer: Medicare Other | Admitting: Orthopedic Surgery

## 2020-11-11 ENCOUNTER — Encounter: Payer: Self-pay | Admitting: Orthopedic Surgery

## 2020-11-11 ENCOUNTER — Telehealth: Payer: Self-pay | Admitting: Radiology

## 2020-11-11 DIAGNOSIS — M170 Bilateral primary osteoarthritis of knee: Secondary | ICD-10-CM

## 2020-11-11 NOTE — Telephone Encounter (Signed)
Noted  

## 2020-11-11 NOTE — Telephone Encounter (Signed)
Patient had bilateral knee Synvisc One injections today. Dr. Marlou Sa would like for [place] to get approval to repeat in 6 months.  Thanks.

## 2020-11-15 NOTE — Telephone Encounter (Signed)
Noted.  Will submit in October, 2022. Next available gel injection would be after 05/14/2021.

## 2020-11-16 ENCOUNTER — Encounter: Payer: Self-pay | Admitting: Orthopedic Surgery

## 2020-11-16 DIAGNOSIS — M170 Bilateral primary osteoarthritis of knee: Secondary | ICD-10-CM

## 2020-11-16 MED ORDER — HYLAN G-F 20 48 MG/6ML IX SOSY
48.0000 mg | PREFILLED_SYRINGE | INTRA_ARTICULAR | Status: AC | PRN
  Administered 2020-11-16: 48 mg via INTRA_ARTICULAR

## 2020-11-16 MED ORDER — LIDOCAINE HCL 1 % IJ SOLN
5.0000 mL | INTRAMUSCULAR | Status: AC | PRN
Start: 2020-11-16 — End: 2020-11-16
  Administered 2020-11-16: 5 mL

## 2020-11-16 NOTE — Progress Notes (Signed)
   Procedure Note  Patient: [name]             Date of Birth: Oct 24, 1940           MRN: [id number]             Visit Date: 11/11/2020  Procedures: Visit Diagnoses:  1. Primary osteoarthritis of both knees     Large Joint Inj: bilateral knee on 11/16/2020 10:57 AM Indications: pain, joint swelling and diagnostic evaluation Details: 18 G 1.5 in needle, superolateral approach  Arthrogram: No  Medications (Right): 5 mL lidocaine 1 %; 48 mg Hylan 48 MG/6ML Medications (Left): 5 mL lidocaine 1 %; 48 mg Hylan 48 MG/6ML Outcome: tolerated well, no immediate complications Procedure, treatment alternatives, risks and benefits explained, specific risks discussed. Consent was given by the patient. Immediately prior to procedure a time out was called to verify the correct patient, procedure, equipment, support staff and site/side marked as required. Patient was prepped and draped in the usual sterile fashion.    This patient is diagnosed with osteoarthritis of the knee(s).    Radiographs show evidence of joint space narrowing, osteophytes, subchondral sclerosis and/or subchondral cysts.  This patient has knee pain which interferes with functional and activities of daily living.    This patient has experienced inadequate response, adverse effects and/or intolerance with conservative treatments such as acetaminophen, NSAIDS, topical creams, physical therapy or regular exercise, knee bracing and/or weight loss.   This patient has experienced inadequate response or has a contraindication to intra articular steroid injections for at least 3 months.   This patient is not scheduled to have a total knee replacement within 6 months of starting treatment with viscosupplementation.

## 2020-11-21 DIAGNOSIS — M5481 Occipital neuralgia: Secondary | ICD-10-CM | POA: Diagnosis not present

## 2020-11-27 DIAGNOSIS — Z124 Encounter for screening for malignant neoplasm of cervix: Secondary | ICD-10-CM | POA: Diagnosis not present

## 2020-11-27 DIAGNOSIS — Z6827 Body mass index (BMI) 27.0-27.9, adult: Secondary | ICD-10-CM | POA: Diagnosis not present

## 2020-11-27 DIAGNOSIS — Z1231 Encounter for screening mammogram for malignant neoplasm of breast: Secondary | ICD-10-CM | POA: Diagnosis not present

## 2020-12-17 DIAGNOSIS — R030 Elevated blood-pressure reading, without diagnosis of hypertension: Secondary | ICD-10-CM | POA: Diagnosis not present

## 2020-12-17 DIAGNOSIS — Z6827 Body mass index (BMI) 27.0-27.9, adult: Secondary | ICD-10-CM | POA: Diagnosis not present

## 2020-12-17 DIAGNOSIS — G501 Atypical facial pain: Secondary | ICD-10-CM | POA: Diagnosis not present

## 2020-12-17 DIAGNOSIS — M5481 Occipital neuralgia: Secondary | ICD-10-CM | POA: Diagnosis not present

## 2020-12-17 DIAGNOSIS — G500 Trigeminal neuralgia: Secondary | ICD-10-CM | POA: Diagnosis not present

## 2021-01-04 DIAGNOSIS — U071 COVID-19: Secondary | ICD-10-CM | POA: Diagnosis not present

## 2021-01-21 DIAGNOSIS — Z6827 Body mass index (BMI) 27.0-27.9, adult: Secondary | ICD-10-CM | POA: Diagnosis not present

## 2021-01-21 DIAGNOSIS — G500 Trigeminal neuralgia: Secondary | ICD-10-CM | POA: Diagnosis not present

## 2021-01-21 DIAGNOSIS — I10 Essential (primary) hypertension: Secondary | ICD-10-CM | POA: Diagnosis not present

## 2021-01-21 DIAGNOSIS — G501 Atypical facial pain: Secondary | ICD-10-CM | POA: Diagnosis not present

## 2021-01-31 ENCOUNTER — Other Ambulatory Visit: Payer: Self-pay

## 2021-01-31 ENCOUNTER — Encounter: Payer: Self-pay | Admitting: Physical Medicine and Rehabilitation

## 2021-01-31 ENCOUNTER — Encounter
Payer: Medicare Other | Attending: Physical Medicine and Rehabilitation | Admitting: Physical Medicine and Rehabilitation

## 2021-01-31 VITALS — BP 147/82 | HR 65 | Temp 97.9°F | Ht 63.0 in | Wt 151.2 lb

## 2021-01-31 DIAGNOSIS — G500 Trigeminal neuralgia: Secondary | ICD-10-CM | POA: Insufficient documentation

## 2021-01-31 DIAGNOSIS — R42 Dizziness and giddiness: Secondary | ICD-10-CM | POA: Insufficient documentation

## 2021-01-31 NOTE — Progress Notes (Signed)
Subjective:    Patient ID: [name], female    DOB: 1941/04/26, 80 y.o.   MRN: [id number]  HPI  [name] presents today for f/u of severe trigeminal neuralgia.   1) Severe trigeminal neuralgia: -she did find benefit from the compound cream- she continues to use this.  -She continues to have a sharp shooting pain doing around her eyelid and the tip teeth and in the top of her head.  -has a radiofrequency ablation treatment that helped but not completely -she is not having as much sharp knife-like pin like in her eye Her top teeth in her left law have been hurting really badly.  -she now takes the baclofen with the gabapentin and they seem to work well together.   2) Fibromyalgia: -She has been almost nonfunctional due to the severity of her pain. It continues to be very severe.  -She would like to wean off the Gabapentin as she feels at risk of falls -she is taking Topamax BID. She felt this was helpful but made her unsteady on her feet.   3) Osteoporosis: -husband asks whether she should get a Prolia shot.   4) Dizziness: -head was swimming -she felt like she was going to fall.   -She tried the Phenytoin for 2 days and did not feel benefit so stopped it.   -She is now taking about 3-4 of the Gabapentin per day, '300mg'$ . She sleeps from 11 to 10:30 in the morning.  She is still taking this.   -She feels very sleepy during the day.   -She has never tried Topirmate before.   Prior history:  Her pain continues to be in her left eye, forehead. She has not tried any topicals for pain. She uses capsaicin for her back and knees.   Since last visit nothing we have tried has helped with her pain. She has tried to decrease her Gabapentin dose but then her pain increased so she has resumed taking 4 Gabapentin per day.   Her husband asks whether he should contact her surgeon regarding whether nerve may have become unclamped.   Discussed prolotherapy, trigger point injections,  Baclofen, Clonazepam, Phenytoin as other options we could try,=.  Pain continues to be excruciating and she states she is not sure how much longer she can live like this.   Prior history: She has having some nausea, lightheadedness and felt this may be related to her Gabapentin. She did not take any yesterday and then felt more sever pain in the evening. She also gets electrical shooting across her jaw.   She does not feel that she has cluster headaches as the pain has been so constant. She feels that she is getting worse. The oxygen made her feel relaxed but it is not helping.   The Sarna lotion does help with the itching in her head.   She was willing to try the Amitriptyline but it makes her too sleepy in the morning.   Her teeth and left eye incredibly hurt.   She feels that the Gabapentin has been making her dizziness.   She asks about biofeedback.   The pain is 7/10 on average, similar to last time.   When the pain is severe it can make her depressed.   Pain Inventory Average Pain 10 Pain Right Now 10 My pain is constant, sharp, burning, stabbing, tingling, and aching  In the last 24 hours, has pain interfered with the following? General activity 8 Relation with others 3 Enjoyment  of life 10 What TIME of day is your pain at its worst? morning , daytime, evening, and night Sleep (in general) Good  Pain is worse with: bending and standing Pain improves with: rest, heat/ice, and injections Relief from Meds: 5  Family History  Problem Relation Age of Onset   Heart disease Mother    Heart disease Father    Stroke Father    Stroke Sister    Asthma Daughter    Colon cancer Neg Hx    Social History   Socioeconomic History   Marital status: Married    Spouse name: Not on file   Number of children: 2   Years of education: college   Highest education level: Not on file  Occupational History   Occupation: housewife    Employer: RETIRED  Tobacco Use   Smoking  status: Never   Smokeless tobacco: Never  Vaping Use   Vaping Use: Never used  Substance and Sexual Activity   Alcohol use: No    Alcohol/week: 0.0 standard drinks   Drug use: No   Sexual activity: Not on file  Other Topics Concern   Not on file  Social History Narrative   1 cup of Jasmine tea daily    Social Determinants of Health   Financial Resource Strain: Not on file  Food Insecurity: Not on file  Transportation Needs: Not on file  Physical Activity: Not on file  Stress: Not on file  Social Connections: Not on file   Past Surgical History:  Procedure Laterality Date   CHOLECYSTECTOMY  03/2010   COLONOSCOPY  2009   Dr. Raford Pitcher    CYSTOSCOPY     Multiple Cystoscopies with stents or biopsy    ESOPHAGOGASTRODUODENOSCOPY  10/2013   Gamma knife radiation  01/2018   for trigeminal neuralgia   TONSILLECTOMY     Past Surgical History:  Procedure Laterality Date   CHOLECYSTECTOMY  03/2010   COLONOSCOPY  2009   Dr. Raford Pitcher    CYSTOSCOPY     Multiple Cystoscopies with stents or biopsy    ESOPHAGOGASTRODUODENOSCOPY  10/2013   Gamma knife radiation  01/2018   for trigeminal neuralgia   TONSILLECTOMY     Past Medical History:  Diagnosis Date   Adenomatous colon polyp 03/24/2018   Allergy    Arthritis    Cataract    Chronic headaches    Depression    Fibromyalgia    Gallstones    GERD (gastroesophageal reflux disease)    Hyperlipemia    Hypertension    Interstitial cystitis    Lactose intolerance    LBBB (left bundle branch block)    Osteoarthritis    Osteopenia    Pneumonia    Thyroid disease    Tinnitus    Trigeminal neuralgia    There were no vitals taken for this visit.  Opioid Risk Score:   Fall Risk Score:  `1  Depression screen PHQ 2/9  Depression screen [facility] 2/9 10/28/2020 06/26/2020 05/14/2020 02/14/2020 02/07/2020 06/30/2016 06/30/2016  Decreased Interest 0 0 0 0 3 0 0  Down, Depressed, Hopeless 0 0 0 0 2 0 0  PHQ - 2 Score 0 0 0 0 5 0  0  Altered sleeping - - - - 0 - -  Tired, decreased energy - - - - 3 - -  Change in appetite - - - - 0 - -  Feeling bad or failure about yourself  - - - - 0 - -  Trouble concentrating - - - -  0 - -  Moving slowly or fidgety/restless - - - - 0 - -  Suicidal thoughts - - - - 0 - -  PHQ-9 Score - - - - 8 - -  Difficult doing work/chores - - - - Very difficult - -    Review of Systems  Constitutional: Negative.   HENT: Negative.    Eyes:  Positive for visual disturbance.  Respiratory: Negative.    Cardiovascular: Negative.   Gastrointestinal: Negative.   Endocrine: Negative.   Genitourinary: Negative.   Musculoskeletal: Negative.   Skin: Negative.   Allergic/Immunologic: Negative.   Neurological:  Positive for dizziness, tremors, light-headedness and numbness.       Tingling   Hematological: Negative.   Psychiatric/Behavioral: Negative.    All other systems reviewed and are negative.      Objective:   Physical Exam Gen: no distress, normal appearing HEENT: oral mucosa pink and moist, NCAT Cardio: Reg rate Chest: normal effort, normal rate of breathing Abd: soft, non-distended Ext: no edema Psych: pleasant, normal affect Skin: intact Neuro: Alert and oriented x3. Left sided decreased sensation in upper, middle, and lower portion of face. Left sided facial droop. Psych: pleasant, normal affect    Assessment & Plan:  [name] is an 80 year old woman who presents for f/u of left sided trigeminal neuralgia and dizziness.    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 She does have a history of migraines.  -reviewed patient's medical notes -Discussed wean off Gabapentin -Increase Topamax to '50mg'$  BID -continue to follow with [facility] for trigeminal nerve radiofrequency ablation.  -Trial Baclofen '5mg'$ , and uptitrate as tolerated. Adised of side effects of drowsiness and hypotension.  -encouraged  anti-inflammatory diet.  -she had sinusitis that was viral and was treated with antibiotics. This was in the early 52s. The symptoms resolved after 2 months. She uses to get sinus infections and saline rinse.  -She has had multiple root canals.  -Discontinue 100% oxygen via facemask as this was not helpful.  -Discussed that unfortunately Sprint PNS is not indicated for craniofacial pain. -Amitriptyline and Lyrica did not help.  -Increase Cymbalta to '90mg'$ .  -Topiramate helped but caused dizziness.  -Provided with hand out with information regarding trigeminal neuralgia -Retry the Phenytoin.  -She would like to try biofeedback. -reviewed neurology note.  -will try trigger point injections next visit.  -Continue Ketamine 10%, Baclofen 2%, Cyclobenzaprine 2%, Ketoprofen 10%, Gabapentin 6%, Bupivacaine 1%, Amitryptiline 5% to apply to painful areas- using 3-4 times per day.  -Can consider Meloxicam if Phenytoin does not help.  -Discussed Qutenza as an option for neuropathic pain control. Discussed that this is a capsaicin patch, stronger than capsaicin cream. Discussed that it is currently approved for diabetic peripheral neuropathy and post-herpetic neuralgia, but that it has also shown benefit in treating other forms of neuropathy. Provided patient with link to site to learn more about the patch: [employer]. Discussed that the patch would be placed in office and benefits usually last 3 months. Discussed that unintended exposure to capsaicin can cause severe irritation of eyes, mucous membranes, respiratory tract, and skin, but that Qutenza is a local treatment and does not have the systemic side effects of other nerve medications. Discussed that there may be pain, itching, erythema, and decreased sensory function associated with the application of Qutenza. Side effects usually subside within 1 week. A cold pack of analgesic medications can help with these side  effects. Blood pressure can  also be increased due to pain associated with administration of the patch.  -she cut down on her gabapentin due to her dizziness    Turmeric to reduce inflammation--can be used in cooking or taken as a supplement.  Benefits of turmeric:  -Highly anti-inflammatory  -Increases antioxidants  -Improves memory, attention, brain disease  -Lowers risk of heart disease  -May help prevent cancer  -Decreases pain  -Alleviates depression  -Delays aging and decreases risk of chronic disease  -Consume with black pepper to increase absorption    Turmeric Milk Recipe:  1 cup milk  1 tsp turmeric  1 tsp cinnamon  1 tsp grated ginger (optional)  Black pepper (boosts the anti-inflammatory properties of turmeric).  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  -she is a good metabolizer of amitriptyline and has tolerated nortrypilline in the past, but without much benefit.    3) AKI: encouraged hydration. She states that she had AKI with Ibuprofen in the past. Discussed checking Cr level next visit if we are considering Meloxicam   4) Hypernatremia: encouraged hydration   5) General health and pain: provided with list of healthy foods that are both nutritious and fight pain.   6) Depressed: Amitriptyline did help with this but she could not tolerate the drowsiness. Encouraged focusing on the factors that she  can control, such as anti-inflammatory diet.   7) Fibromyalgia: Continue '30mg'$  of steroids per day for fibromyalgia. She is on that intermittently. She started this again about a month ago. She has been on this about a year.  8) Dizziness: -discussed that this could be from the combination of gabapentin and baclofen.   All questions answered. RTC in 1 month.

## 2021-02-03 DIAGNOSIS — I10 Essential (primary) hypertension: Secondary | ICD-10-CM | POA: Diagnosis not present

## 2021-02-03 DIAGNOSIS — E7801 Familial hypercholesterolemia: Secondary | ICD-10-CM | POA: Diagnosis not present

## 2021-02-04 DIAGNOSIS — Z961 Presence of intraocular lens: Secondary | ICD-10-CM | POA: Diagnosis not present

## 2021-02-04 DIAGNOSIS — H18413 Arcus senilis, bilateral: Secondary | ICD-10-CM | POA: Diagnosis not present

## 2021-02-04 DIAGNOSIS — G500 Trigeminal neuralgia: Secondary | ICD-10-CM | POA: Diagnosis not present

## 2021-02-04 DIAGNOSIS — H353131 Nonexudative age-related macular degeneration, bilateral, early dry stage: Secondary | ICD-10-CM | POA: Diagnosis not present

## 2021-02-06 DIAGNOSIS — Z0000 Encounter for general adult medical examination without abnormal findings: Secondary | ICD-10-CM | POA: Diagnosis not present

## 2021-02-06 DIAGNOSIS — K219 Gastro-esophageal reflux disease without esophagitis: Secondary | ICD-10-CM | POA: Diagnosis not present

## 2021-02-06 DIAGNOSIS — F339 Major depressive disorder, recurrent, unspecified: Secondary | ICD-10-CM | POA: Diagnosis not present

## 2021-02-06 DIAGNOSIS — G500 Trigeminal neuralgia: Secondary | ICD-10-CM | POA: Diagnosis not present

## 2021-02-06 DIAGNOSIS — E7800 Pure hypercholesterolemia, unspecified: Secondary | ICD-10-CM | POA: Diagnosis not present

## 2021-02-06 DIAGNOSIS — J309 Allergic rhinitis, unspecified: Secondary | ICD-10-CM | POA: Diagnosis not present

## 2021-02-06 DIAGNOSIS — E039 Hypothyroidism, unspecified: Secondary | ICD-10-CM | POA: Diagnosis not present

## 2021-02-06 DIAGNOSIS — I10 Essential (primary) hypertension: Secondary | ICD-10-CM | POA: Diagnosis not present

## 2021-02-06 DIAGNOSIS — R942 Abnormal results of pulmonary function studies: Secondary | ICD-10-CM | POA: Diagnosis not present

## 2021-02-06 DIAGNOSIS — M8580 Other specified disorders of bone density and structure, unspecified site: Secondary | ICD-10-CM | POA: Diagnosis not present

## 2021-02-10 DIAGNOSIS — M25559 Pain in unspecified hip: Secondary | ICD-10-CM | POA: Diagnosis not present

## 2021-02-10 DIAGNOSIS — M810 Age-related osteoporosis without current pathological fracture: Secondary | ICD-10-CM | POA: Diagnosis not present

## 2021-02-10 DIAGNOSIS — M170 Bilateral primary osteoarthritis of knee: Secondary | ICD-10-CM | POA: Diagnosis not present

## 2021-02-10 DIAGNOSIS — M150 Primary generalized (osteo)arthritis: Secondary | ICD-10-CM | POA: Diagnosis not present

## 2021-02-10 DIAGNOSIS — M353 Polymyalgia rheumatica: Secondary | ICD-10-CM | POA: Diagnosis not present

## 2021-02-10 DIAGNOSIS — M797 Fibromyalgia: Secondary | ICD-10-CM | POA: Diagnosis not present

## 2021-02-10 DIAGNOSIS — G500 Trigeminal neuralgia: Secondary | ICD-10-CM | POA: Diagnosis not present

## 2021-02-10 DIAGNOSIS — M25562 Pain in left knee: Secondary | ICD-10-CM | POA: Diagnosis not present

## 2021-02-11 DIAGNOSIS — H26491 Other secondary cataract, right eye: Secondary | ICD-10-CM | POA: Diagnosis not present

## 2021-02-11 DIAGNOSIS — H26493 Other secondary cataract, bilateral: Secondary | ICD-10-CM | POA: Diagnosis not present

## 2021-02-11 DIAGNOSIS — H04123 Dry eye syndrome of bilateral lacrimal glands: Secondary | ICD-10-CM | POA: Diagnosis not present

## 2021-02-11 DIAGNOSIS — Z961 Presence of intraocular lens: Secondary | ICD-10-CM | POA: Diagnosis not present

## 2021-02-11 DIAGNOSIS — G500 Trigeminal neuralgia: Secondary | ICD-10-CM | POA: Diagnosis not present

## 2021-02-13 DIAGNOSIS — H903 Sensorineural hearing loss, bilateral: Secondary | ICD-10-CM | POA: Diagnosis not present

## 2021-02-13 DIAGNOSIS — R42 Dizziness and giddiness: Secondary | ICD-10-CM | POA: Diagnosis not present

## 2021-02-13 DIAGNOSIS — H6121 Impacted cerumen, right ear: Secondary | ICD-10-CM | POA: Diagnosis not present

## 2021-02-13 DIAGNOSIS — Z974 Presence of external hearing-aid: Secondary | ICD-10-CM | POA: Diagnosis not present

## 2021-02-18 DIAGNOSIS — Z961 Presence of intraocular lens: Secondary | ICD-10-CM | POA: Diagnosis not present

## 2021-02-19 DIAGNOSIS — H903 Sensorineural hearing loss, bilateral: Secondary | ICD-10-CM | POA: Diagnosis not present

## 2021-02-19 DIAGNOSIS — R42 Dizziness and giddiness: Secondary | ICD-10-CM | POA: Diagnosis not present

## 2021-02-25 DIAGNOSIS — M810 Age-related osteoporosis without current pathological fracture: Secondary | ICD-10-CM | POA: Diagnosis not present

## 2021-02-25 DIAGNOSIS — G500 Trigeminal neuralgia: Secondary | ICD-10-CM | POA: Diagnosis not present

## 2021-02-25 DIAGNOSIS — I10 Essential (primary) hypertension: Secondary | ICD-10-CM | POA: Diagnosis not present

## 2021-03-04 DIAGNOSIS — I10 Essential (primary) hypertension: Secondary | ICD-10-CM | POA: Diagnosis not present

## 2021-03-04 DIAGNOSIS — Z6827 Body mass index (BMI) 27.0-27.9, adult: Secondary | ICD-10-CM | POA: Diagnosis not present

## 2021-03-04 DIAGNOSIS — N390 Urinary tract infection, site not specified: Secondary | ICD-10-CM | POA: Diagnosis not present

## 2021-03-07 DIAGNOSIS — M810 Age-related osteoporosis without current pathological fracture: Secondary | ICD-10-CM | POA: Diagnosis not present

## 2021-03-26 DIAGNOSIS — G500 Trigeminal neuralgia: Secondary | ICD-10-CM | POA: Diagnosis not present

## 2021-03-26 DIAGNOSIS — I10 Essential (primary) hypertension: Secondary | ICD-10-CM | POA: Diagnosis not present

## 2021-03-26 DIAGNOSIS — G501 Atypical facial pain: Secondary | ICD-10-CM | POA: Diagnosis not present

## 2021-03-26 DIAGNOSIS — Z6827 Body mass index (BMI) 27.0-27.9, adult: Secondary | ICD-10-CM | POA: Diagnosis not present

## 2021-04-01 DIAGNOSIS — Z23 Encounter for immunization: Secondary | ICD-10-CM | POA: Diagnosis not present

## 2021-04-03 DIAGNOSIS — H26492 Other secondary cataract, left eye: Secondary | ICD-10-CM | POA: Diagnosis not present

## 2021-04-09 DIAGNOSIS — Z961 Presence of intraocular lens: Secondary | ICD-10-CM | POA: Diagnosis not present

## 2021-04-10 ENCOUNTER — Telehealth: Payer: Self-pay

## 2021-04-10 DIAGNOSIS — Z23 Encounter for immunization: Secondary | ICD-10-CM | POA: Diagnosis not present

## 2021-04-10 DIAGNOSIS — I10 Essential (primary) hypertension: Secondary | ICD-10-CM | POA: Diagnosis not present

## 2021-04-10 NOTE — Telephone Encounter (Signed)
Dr. Shelia Media, the PCP for the patient, called and would like a return call from Dr. Ranell Patrick

## 2021-04-16 DIAGNOSIS — Z6826 Body mass index (BMI) 26.0-26.9, adult: Secondary | ICD-10-CM | POA: Diagnosis not present

## 2021-04-16 DIAGNOSIS — I10 Essential (primary) hypertension: Secondary | ICD-10-CM | POA: Diagnosis not present

## 2021-04-16 DIAGNOSIS — G500 Trigeminal neuralgia: Secondary | ICD-10-CM | POA: Diagnosis not present

## 2021-04-22 ENCOUNTER — Telehealth: Payer: Self-pay

## 2021-04-22 NOTE — Telephone Encounter (Signed)
VOB submitted for SynviscOne, bilateral knee. Pending BV. 

## 2021-04-23 ENCOUNTER — Telehealth: Payer: Self-pay

## 2021-04-23 ENCOUNTER — Other Ambulatory Visit: Payer: Self-pay | Admitting: Neurological Surgery

## 2021-04-23 NOTE — Telephone Encounter (Signed)
Approved for SynviscOne, bilateral knee. [place] deductible has been met Covered at 100% through [facility] after Medicare pays. No Co-pay No PA required  Appt. 05/21/2021

## 2021-04-24 DIAGNOSIS — I10 Essential (primary) hypertension: Secondary | ICD-10-CM | POA: Diagnosis not present

## 2021-04-25 ENCOUNTER — Other Ambulatory Visit: Payer: Self-pay

## 2021-04-25 ENCOUNTER — Encounter (HOSPITAL_COMMUNITY): Payer: Self-pay | Admitting: Neurological Surgery

## 2021-04-25 NOTE — Progress Notes (Signed)
PCP - Deland Pretty Cardiologist - denies Pulmonology: Loanne Drilling  PPM/ICD - denies   Chest x-ray - 07/15/18 EKG - DOS Stress Test - denies ECHO - 07/05/18 Cardiac Cath - denies  CPAP - denies  No diabetes    ERAS Protcol - clear liquids until 1025  COVID TEST- needs covid test DOS  Anesthesia review: no  Patient verbally denies any shortness of breath, fever, cough and chest pain during phone call   -------------  SDW INSTRUCTIONS given:  Your procedure is scheduled on 04/28/21.  Report to [facility] Main Entrance "A" at 10:25 A.M., and check in at the Admitting office.  Call this number if you have problems the morning of surgery:  [phone]   Remember:  Do not eat after midnight the night before your surgery  You may drink clear liquids until 10:25 the morning of your surgery.   Clear liquids allowed are: Water, Non-Citrus Juices (without pulp), Carbonated Beverages, Clear Tea, Black Coffee Only, and Gatorade    Take these medicines the morning of surgery with A SIP OF WATER  cetirizine (ZYRTEC)  DULoxetine (CYMBALTA) fluticasone (FLONASE)  Fluticasone Propionate,sensor, (ARMONAIR DIGIHALER) gabapentin (NEURONTIN)  levothyroxine (SYNTHROID) ondansetron (ZOFRAN) if needed pantoprazole (PROTONIX)   As of today, STOP taking any Aspirin (unless otherwise instructed by your surgeon) Aleve, Naproxen, Ibuprofen, Motrin, Advil, Goody's, BC's, all herbal medications, fish oil, and all vitamins.                      Do not wear jewelry, make up, or nail polish            Do not wear lotions, powders, perfumes/colognes, or deodorant.            Do not shave 48 hours prior to surgery.  Men may shave face and neck.            Do not bring valuables to the hospital.            [facility] is not responsible for any belongings or valuables.  Do NOT Smoke (Tobacco/Vaping) or drink Alcohol 24 hours prior to your procedure If you use a CPAP at night, you may bring all  equipment for your overnight stay.   Contacts, glasses, dentures or bridgework may not be worn into surgery.      For patients admitted to the hospital, discharge time will be determined by your treatment team.   Patients discharged the day of surgery will not be allowed to drive home, and someone needs to stay with them for 24 hours.    Special instructions:   [place]- Preparing For Surgery  Before surgery, you can play an important role. Because skin is not sterile, your skin needs to be as free of germs as possible. You can reduce the number of germs on your skin by washing with CHG (chlorahexidine gluconate) Soap before surgery.  CHG is an antiseptic cleaner which kills germs and bonds with the skin to continue killing germs even after washing.    Oral Hygiene is also important to reduce your risk of infection.  Remember - BRUSH YOUR TEETH THE MORNING OF SURGERY WITH YOUR REGULAR TOOTHPASTE  Please do not use if you have an allergy to CHG or antibacterial soaps. If your skin becomes reddened/irritated stop using the CHG.  Do not shave (including legs and underarms) for at least 48 hours prior to first CHG shower. It is OK to shave your face.  Please follow these  instructions carefully.   Shower the NIGHT BEFORE SURGERY and the MORNING OF SURGERY with DIAL Soap.   Pat yourself dry with a CLEAN TOWEL.  Wear CLEAN PAJAMAS to bed the night before surgery  Place CLEAN SHEETS on your bed the night of your first shower and DO NOT SLEEP WITH PETS.   Day of Surgery: Please shower morning of surgery  Wear Clean/Comfortable clothing the morning of surgery Do not apply any deodorants/lotions.   Remember to brush your teeth WITH YOUR REGULAR TOOTHPASTE.   Questions were answered. Patient verbalized understanding of instructions.

## 2021-04-28 ENCOUNTER — Inpatient Hospital Stay (HOSPITAL_COMMUNITY): Payer: Medicare Other | Admitting: Certified Registered"

## 2021-04-28 ENCOUNTER — Encounter (HOSPITAL_COMMUNITY)
Admission: RE | Disposition: A | Discharge status: Home / Self Care | Payer: Self-pay | Source: Home / Self Care | Attending: Neurological Surgery

## 2021-04-28 ENCOUNTER — Ambulatory Visit (HOSPITAL_COMMUNITY)
Admission: RE | Admit: 2021-04-28 | Discharge: 2021-04-29 | Disposition: A | Discharge status: Home / Self Care | Payer: Medicare Other | Attending: Neurological Surgery | Admitting: Neurological Surgery

## 2021-04-28 ENCOUNTER — Other Ambulatory Visit: Payer: Self-pay

## 2021-04-28 ENCOUNTER — Encounter (HOSPITAL_COMMUNITY): Payer: Self-pay | Admitting: Neurological Surgery

## 2021-04-28 ENCOUNTER — Inpatient Hospital Stay (HOSPITAL_COMMUNITY): Payer: Medicare Other

## 2021-04-28 DIAGNOSIS — Z9889 Other specified postprocedural states: Secondary | ICD-10-CM | POA: Diagnosis not present

## 2021-04-28 DIAGNOSIS — R42 Dizziness and giddiness: Secondary | ICD-10-CM | POA: Insufficient documentation

## 2021-04-28 DIAGNOSIS — Z20822 Contact with and (suspected) exposure to covid-19: Secondary | ICD-10-CM | POA: Diagnosis not present

## 2021-04-28 DIAGNOSIS — M5416 Radiculopathy, lumbar region: Secondary | ICD-10-CM | POA: Diagnosis not present

## 2021-04-28 DIAGNOSIS — K219 Gastro-esophageal reflux disease without esophagitis: Secondary | ICD-10-CM | POA: Insufficient documentation

## 2021-04-28 DIAGNOSIS — M6281 Muscle weakness (generalized): Secondary | ICD-10-CM | POA: Diagnosis not present

## 2021-04-28 DIAGNOSIS — Z419 Encounter for procedure for purposes other than remedying health state, unspecified: Secondary | ICD-10-CM

## 2021-04-28 DIAGNOSIS — G501 Atypical facial pain: Secondary | ICD-10-CM | POA: Insufficient documentation

## 2021-04-28 DIAGNOSIS — R2689 Other abnormalities of gait and mobility: Secondary | ICD-10-CM | POA: Diagnosis not present

## 2021-04-28 DIAGNOSIS — I10 Essential (primary) hypertension: Secondary | ICD-10-CM | POA: Insufficient documentation

## 2021-04-28 DIAGNOSIS — G500 Trigeminal neuralgia: Principal | ICD-10-CM | POA: Insufficient documentation

## 2021-04-28 HISTORY — PX: RHIZOTOMY: SHX2355

## 2021-04-28 LAB — CBC
HCT: 42.7 % (ref 36.0–46.0)
Hemoglobin: 13.6 g/dL (ref 12.0–15.0)
MCH: 29.4 pg (ref 26.0–34.0)
MCHC: 31.9 g/dL (ref 30.0–36.0)
MCV: 92.2 fL (ref 80.0–100.0)
Platelets: 198 10*3/uL (ref 150–400)
RBC: 4.63 MIL/uL (ref 3.87–5.11)
RDW: 13.5 % (ref 11.5–15.5)
WBC: 7.6 10*3/uL (ref 4.0–10.5)
nRBC: 0 % (ref 0.0–0.2)

## 2021-04-28 LAB — BASIC METABOLIC PANEL
Anion gap: 9 (ref 5–15)
BUN: 28 mg/dL — ABNORMAL HIGH (ref 8–23)
CO2: 27 mmol/L (ref 22–32)
Calcium: 9.3 mg/dL (ref 8.9–10.3)
Chloride: 101 mmol/L (ref 98–111)
Creatinine, Ser: 1.52 mg/dL — ABNORMAL HIGH (ref 0.44–1.00)
GFR, Estimated: 34 mL/min — ABNORMAL LOW (ref >60)
Glucose, Bld: 93 mg/dL (ref 70–99)
Potassium: 4.2 mmol/L (ref 3.5–5.1)
Sodium: 137 mmol/L (ref 135–145)

## 2021-04-28 LAB — SARS CORONAVIRUS 2 BY RT PCR (HOSPITAL ORDER, PERFORMED IN ~~LOC~~ HOSPITAL LAB): SARS Coronavirus 2: NEGATIVE

## 2021-04-28 SURGERY — RHIZOTOMY
Anesthesia: General | Site: Head | Laterality: Left

## 2021-04-28 MED ORDER — CHLORHEXIDINE GLUCONATE CLOTH 2 % EX PADS: 6.0000 | MEDICATED_PAD | Freq: Once | CUTANEOUS | Status: DC

## 2021-04-28 MED ORDER — HYDROMORPHONE HCL 1 MG/ML IJ SOLN
0.5000 mg | INTRAMUSCULAR | Status: DC | PRN
  Administered 2021-04-29: 0.5 mg via INTRAVENOUS
  Filled 2021-04-28: qty 0.5

## 2021-04-28 MED ORDER — IOHEXOL 300 MG/ML  SOLN
INTRAMUSCULAR | Status: DC | PRN
  Administered 2021-04-28: 100 mL

## 2021-04-28 MED ORDER — HYDROCHLOROTHIAZIDE 12.5 MG PO CAPS
12.5000 mg | ORAL_CAPSULE | Freq: Every morning | ORAL | Status: DC
  Filled 2021-04-28: qty 1

## 2021-04-28 MED ORDER — 0.9 % SODIUM CHLORIDE (POUR BTL) OPTIME
TOPICAL | Status: DC | PRN
  Administered 2021-04-28: 1000 mL

## 2021-04-28 MED ORDER — FLUTICASONE PROPIONATE 50 MCG/ACT NA SUSP
1.0000 | Freq: Every day | NASAL | Status: DC
  Filled 2021-04-28: qty 16

## 2021-04-28 MED ORDER — FENTANYL CITRATE (PF) 250 MCG/5ML IJ SOLN
INTRAMUSCULAR | Status: DC | PRN
  Administered 2021-04-28: 100 ug via INTRAVENOUS

## 2021-04-28 MED ORDER — EPHEDRINE SULFATE-NACL 50-0.9 MG/10ML-% IV SOSY
PREFILLED_SYRINGE | INTRAVENOUS | Status: DC | PRN
  Administered 2021-04-28: 15 mg via INTRAVENOUS
  Administered 2021-04-28: 10 mg via INTRAVENOUS

## 2021-04-28 MED ORDER — FENTANYL CITRATE (PF) 100 MCG/2ML IJ SOLN
INTRAMUSCULAR | Status: AC
  Administered 2021-04-28: 50 ug
  Filled 2021-04-28: qty 2

## 2021-04-28 MED ORDER — CHLORHEXIDINE GLUCONATE 0.12 % MT SOLN: 15.0000 mL | Freq: Once | OROMUCOSAL | Status: AC

## 2021-04-28 MED ORDER — PROMETHAZINE HCL 25 MG PO TABS: 12.5000 mg | ORAL_TABLET | ORAL | Status: DC | PRN

## 2021-04-28 MED ORDER — LIDOCAINE HCL (PF) 1 % IJ SOLN
INTRAMUSCULAR | Status: AC
  Filled 2021-04-28: qty 30

## 2021-04-28 MED ORDER — DOCUSATE SODIUM 100 MG PO CAPS: 100.0000 mg | ORAL_CAPSULE | Freq: Two times a day (BID) | ORAL | Status: DC

## 2021-04-28 MED ORDER — ORAL CARE MOUTH RINSE
15.0000 mL | Freq: Once | OROMUCOSAL | Status: AC
  Administered 2021-04-28: 15 mL via OROMUCOSAL

## 2021-04-28 MED ORDER — ONDANSETRON HCL 4 MG/2ML IJ SOLN
INTRAMUSCULAR | Status: AC
  Filled 2021-04-28: qty 2

## 2021-04-28 MED ORDER — SUCCINYLCHOLINE CHLORIDE 200 MG/10ML IV SOSY
PREFILLED_SYRINGE | INTRAVENOUS | Status: DC | PRN
Start: 2021-04-28 — End: 2021-04-28
  Administered 2021-04-28: 100 mg via INTRAVENOUS

## 2021-04-28 MED ORDER — PANTOPRAZOLE SODIUM 40 MG PO TBEC
40.0000 mg | DELAYED_RELEASE_TABLET | Freq: Every day | ORAL | Status: DC
  Filled 2021-04-28: qty 1

## 2021-04-28 MED ORDER — ONDANSETRON HCL 4 MG/2ML IJ SOLN
INTRAMUSCULAR | Status: DC | PRN
  Administered 2021-04-28: 4 mg via INTRAVENOUS

## 2021-04-28 MED ORDER — HYDROMORPHONE HCL 1 MG/ML IJ SOLN
0.2500 mg | INTRAMUSCULAR | Status: DC | PRN
  Administered 2021-04-28: 0.25 mg via INTRAVENOUS
  Administered 2021-04-28: 0.5 mg via INTRAVENOUS

## 2021-04-28 MED ORDER — HYDROCODONE-ACETAMINOPHEN 5-325 MG PO TABS
1.0000 | ORAL_TABLET | ORAL | Status: DC | PRN
  Administered 2021-04-28 – 2021-04-29 (×3): 1 via ORAL
  Filled 2021-04-28 (×3): qty 1

## 2021-04-28 MED ORDER — ACETAMINOPHEN 10 MG/ML IV SOLN
1000.0000 mg | Freq: Once | INTRAVENOUS | Status: DC | PRN
  Administered 2021-04-28: 1000 mg via INTRAVENOUS

## 2021-04-28 MED ORDER — PHENYLEPHRINE 40 MCG/ML (10ML) SYRINGE FOR IV PUSH (FOR BLOOD PRESSURE SUPPORT)
PREFILLED_SYRINGE | INTRAVENOUS | Status: DC | PRN
  Administered 2021-04-28: 40 ug via INTRAVENOUS

## 2021-04-28 MED ORDER — POLYETHYLENE GLYCOL 3350 17 G PO PACK: 17.0000 g | PACK | Freq: Every day | ORAL | Status: DC | PRN

## 2021-04-28 MED ORDER — ONDANSETRON HCL 4 MG/2ML IJ SOLN: 4.0000 mg | Freq: Once | INTRAMUSCULAR | Status: DC | PRN

## 2021-04-28 MED ORDER — LORATADINE 10 MG PO TABS: 10.0000 mg | ORAL_TABLET | Freq: Every day | ORAL | Status: DC

## 2021-04-28 MED ORDER — IRBESARTAN 150 MG PO TABS: 300.0000 mg | ORAL_TABLET | Freq: Every day | ORAL | Status: DC

## 2021-04-28 MED ORDER — PROPOFOL 10 MG/ML IV BOLUS
INTRAVENOUS | Status: AC
  Filled 2021-04-28: qty 20

## 2021-04-28 MED ORDER — CALCIUM CARBONATE 1250 (500 CA) MG PO TABS
600.0000 mg | ORAL_TABLET | Freq: Every day | ORAL | Status: DC
  Filled 2021-04-28: qty 0.5

## 2021-04-28 MED ORDER — ACETAMINOPHEN 325 MG PO TABS: 650.0000 mg | ORAL_TABLET | ORAL | Status: DC | PRN

## 2021-04-28 MED ORDER — VITAMIN B-12 1000 MCG PO TABS: 2500.0000 ug | ORAL_TABLET | Freq: Every day | ORAL | Status: DC

## 2021-04-28 MED ORDER — BUDESONIDE 0.5 MG/2ML IN SUSP
0.5000 mg | Freq: Two times a day (BID) | RESPIRATORY_TRACT | Status: DC
  Filled 2021-04-28 (×3): qty 2

## 2021-04-28 MED ORDER — HYDROMORPHONE HCL 1 MG/ML IJ SOLN
INTRAMUSCULAR | Status: AC
  Administered 2021-04-28: 0.25 mg via INTRAVENOUS
  Filled 2021-04-28: qty 1

## 2021-04-28 MED ORDER — LEVOTHYROXINE SODIUM 75 MCG PO TABS
75.0000 ug | ORAL_TABLET | Freq: Every day | ORAL | Status: DC
  Administered 2021-04-29: 75 ug via ORAL
  Filled 2021-04-28: qty 1

## 2021-04-28 MED ORDER — CALCIUM CARBONATE 1250 (500 CA) MG PO TABS
500.0000 mg | ORAL_TABLET | Freq: Every day | ORAL | Status: DC
  Filled 2021-04-28: qty 1

## 2021-04-28 MED ORDER — DEXAMETHASONE SODIUM PHOSPHATE 10 MG/ML IJ SOLN
INTRAMUSCULAR | Status: AC
  Filled 2021-04-28: qty 1

## 2021-04-28 MED ORDER — FENTANYL CITRATE (PF) 100 MCG/2ML IJ SOLN
50.0000 ug | INTRAMUSCULAR | Status: AC | PRN
  Administered 2021-04-28 (×2): 50 ug via INTRAVENOUS

## 2021-04-28 MED ORDER — LIDOCAINE 2% (20 MG/ML) 5 ML SYRINGE
INTRAMUSCULAR | Status: DC | PRN
  Administered 2021-04-28: 100 mg via INTRAVENOUS

## 2021-04-28 MED ORDER — VITAMIN B-6 50 MG PO TABS
50.0000 mg | ORAL_TABLET | Freq: Every day | ORAL | Status: DC
  Filled 2021-04-28 (×2): qty 1

## 2021-04-28 MED ORDER — ACETAMINOPHEN 650 MG RE SUPP: 650.0000 mg | RECTAL | Status: DC | PRN

## 2021-04-28 MED ORDER — PREDNISONE 5 MG PO TABS
5.0000 mg | ORAL_TABLET | Freq: Every day | ORAL | Status: DC
  Filled 2021-04-28: qty 1

## 2021-04-28 MED ORDER — DULOXETINE HCL 30 MG PO CPEP: 30.0000 mg | ORAL_CAPSULE | Freq: Two times a day (BID) | ORAL | Status: DC

## 2021-04-28 MED ORDER — CEFAZOLIN SODIUM-DEXTROSE 2-4 GM/100ML-% IV SOLN
2.0000 g | INTRAVENOUS | Status: AC
  Administered 2021-04-28: 2 g via INTRAVENOUS
  Filled 2021-04-28: qty 100

## 2021-04-28 MED ORDER — ONDANSETRON HCL 4 MG PO TABS: 4.0000 mg | ORAL_TABLET | Freq: Two times a day (BID) | ORAL | Status: DC | PRN

## 2021-04-28 MED ORDER — DEXAMETHASONE SODIUM PHOSPHATE 10 MG/ML IJ SOLN
INTRAMUSCULAR | Status: DC | PRN
  Administered 2021-04-28: 10 mg via INTRAVENOUS

## 2021-04-28 MED ORDER — LACTATED RINGERS IV SOLN: INTRAVENOUS | Status: DC

## 2021-04-28 MED ORDER — ACETAMINOPHEN 10 MG/ML IV SOLN
INTRAVENOUS | Status: AC
  Filled 2021-04-28: qty 100

## 2021-04-28 MED ORDER — ESTROGENS CONJUGATED 0.625 MG/GM VA CREA
1.0000 | TOPICAL_CREAM | VAGINAL | Status: DC
  Filled 2021-04-28: qty 30

## 2021-04-28 MED ORDER — CAPSAICIN 0.075 % EX CREA
1.0000 "application " | TOPICAL_CREAM | Freq: Every day | CUTANEOUS | Status: DC | PRN
  Filled 2021-04-28: qty 60

## 2021-04-28 MED ORDER — VITAMIN D 25 MCG (1000 UNIT) PO TABS: 2000.0000 [IU] | ORAL_TABLET | Freq: Every day | ORAL | Status: DC

## 2021-04-28 MED ORDER — GABAPENTIN 300 MG PO CAPS: 300.0000 mg | ORAL_CAPSULE | ORAL | Status: DC | PRN

## 2021-04-28 MED ORDER — FENTANYL CITRATE (PF) 250 MCG/5ML IJ SOLN
INTRAMUSCULAR | Status: AC
  Filled 2021-04-28: qty 5

## 2021-04-28 MED ORDER — PROPOFOL 10 MG/ML IV BOLUS
INTRAVENOUS | Status: DC | PRN
  Administered 2021-04-28: 100 mg via INTRAVENOUS

## 2021-04-28 MED ORDER — TOPIRAMATE 25 MG PO TABS: 50.0000 mg | ORAL_TABLET | Freq: Two times a day (BID) | ORAL | Status: DC

## 2021-04-28 SURGICAL SUPPLY — 35 items
BAG COUNTER SPONGE SURGICOUNT (BAG) ×2 IMPLANT
BNDG ADH 1X3 SHEER STRL LF (GAUZE/BANDAGES/DRESSINGS) ×2 IMPLANT
CANNULA PERC RF ACTIVE TIP 20G (CANNULA) IMPLANT
CATH EMB 4FR 40CM (CATHETERS) ×2 IMPLANT
CATH EMB 4FR 80CM (CATHETERS) ×2 IMPLANT
CATH EMB LATEX FREE 3FRX80CM (CATHETERS)
CATH EMB LF 3FRX80 (CATHETERS) IMPLANT
CATH LUMBAR HERMETIC 14G (CATHETERS) IMPLANT
CATHETER LUMBAR HERMETIC 14G (CATHETERS)
COVER BACK TABLE 60X90IN (DRAPES) ×2 IMPLANT
DECANTER SPIKE VIAL GLASS SM (MISCELLANEOUS) ×2 IMPLANT
DRAPE HALF SHEET 40X57 (DRAPES) ×2 IMPLANT
DURAPREP 6ML APPLICATOR 50/CS (WOUND CARE) ×2 IMPLANT
GAUZE 4X4 16PLY ~~LOC~~+RFID DBL (SPONGE) ×2 IMPLANT
GLOVE SURG ENC MOIS LTX SZ7.5 (GLOVE) ×4 IMPLANT
GLOVE SURG LTX SZ8 (GLOVE) IMPLANT
GLOVE SURG UNDER POLY LF SZ7.5 (GLOVE) ×4 IMPLANT
GOWN STRL REUS W/ TWL LRG LVL3 (GOWN DISPOSABLE) IMPLANT
GOWN STRL REUS W/ TWL XL LVL3 (GOWN DISPOSABLE) ×2 IMPLANT
GOWN STRL REUS W/TWL 2XL LVL3 (GOWN DISPOSABLE) IMPLANT
GOWN STRL REUS W/TWL LRG LVL3 (GOWN DISPOSABLE)
GOWN STRL REUS W/TWL XL LVL3 (GOWN DISPOSABLE) ×4
KIT BASIN OR (CUSTOM PROCEDURE TRAY) ×2 IMPLANT
KIT TURNOVER KIT B (KITS) ×2 IMPLANT
MARKER SKIN DUAL TIP RULER LAB (MISCELLANEOUS) ×2 IMPLANT
NEEDLE EPID 14G 6 TOUHY (NEEDLE) ×2 IMPLANT
NEEDLE HYPO 25X1 1.5 SAFETY (NEEDLE) ×2 IMPLANT
NS IRRIG 1000ML POUR BTL (IV SOLUTION) ×2 IMPLANT
PAD ARMBOARD 7.5X6 YLW CONV (MISCELLANEOUS) ×2 IMPLANT
PIN SAFETY STERILE (MISCELLANEOUS) ×2 IMPLANT
STOPCOCK 4 WAY LG BORE MALE ST (IV SETS) IMPLANT
SYR CONTROL 10ML LL (SYRINGE) ×2 IMPLANT
SYR TB 1ML LUER SLIP (SYRINGE) ×2 IMPLANT
TUBING EXTENTION W/L.L. (IV SETS) IMPLANT
WATER STERILE IRR 1000ML POUR (IV SOLUTION) ×2 IMPLANT

## 2021-04-28 NOTE — Op Note (Signed)
PATIENT: [name]  DAY OF SURGERY: 04/28/21   PRE-OPERATIVE DIAGNOSIS:  Left trigeminal neuralgia   POST-OPERATIVE DIAGNOSIS:  Same   PROCEDURE:  Left percutaneous balloon rhizotomy of the trigeminal nerve   SURGEON:  Surgeon(s) and Role:    Judith Part, MD - Primary   ANESTHESIA: ETGA   BRIEF HISTORY: This is an 80 year old woman who previously had an MVD with intra-op rhizotomy followed by 2 SRS rhizotomies who presented with recurrent severe left sided facial pain c/w her prior TGN. 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 She was miserable and, given her minimal numbness, I offered a percutaneous balloon rhizotomy. We discussed this in great detail, given the complexity of the situation with regard to expectations regarding outcome, potential complications, risks, benefits, and alternatives and she wished to proceed.   OPERATIVE DETAIL: The patient was taken to the operating room and placed on the OR table in the supine position. A formal time out was performed with two patient identifiers and confirmed the operative site. Anesthesia was induced by the anesthesia team. The operative site was marked, hair was clipped with surgical clippers, the area was then prepped and draped in a sterile fashion.   Using fluoroscopic guidance, a Tuohy needle was inserted 2.5cm lateral to the corner of the mouth and aimed at a point between the ipsilateral pupil and 2.5cm anterior to the EAC. The oral muscosa was protected with a gloved hand to prevent penetration of the oral mucosa as well as palpate the pterygoid process and mandible during passage through this phase of the trajectory. The needle was advanced using fluoroscopy towards the petroclival junction just below the sella. The characteristic masseter jerk was well visualized and CSF was obtained shortly thereafter.   A 4 french fogarty  catheter was inserted through the needle and inflated with contrast. As it began to take shape along V3, the proximal portion of the balloon touched the needle and the balloon failed. A new catheter was therefore placed into position and the Tuohy needle was withdrawn slightly to allow the new catheter to be in the same position but free from the sharp edge of the needle. This worked well and the balloon was inflated, conforming to the shape of the roots and appeared to be in a good location. The balloon was therefore kept inflated for 60 seconds, deflated, and removed. The stylet was reintroduced and the needle was withdrawn.  The patient was then returned to anesthesia for emergence. No apparent complications at the completion of the procedure.   EBL:  14mL   DRAINS: none   SPECIMENS: none   Judith Part, MD 04/28/21 3:47 PM

## 2021-04-28 NOTE — Progress Notes (Signed)
Neurosurgery Service Post-operative progress note  Assessment & Plan: 80 y.o. woman s/p L perc trigeminal balloon rhizotomy, seen in PACU, Aox3 PERRL, gaze conjugate w/o diplopia, face symmetric, stable mild left facial numbness, strength 5/5x4.  -will admit for obs overnight and to re-evaluate her pain  Judith Part  04/28/21 6:29 PM

## 2021-04-28 NOTE — Progress Notes (Signed)
Pacu RN Report to floor given  Gave report to [employer]. 3C08. Discussed surgery, meds given in OR and Pacu, VS, IV fluids given, EBL, urine output, pain and other pertinent information. Also discussed if pt had any family or friends here or belongings with them.   Pt was given Dilaudid 1mg  which did not work for pt. Anesthesia was called and Fentanyl and IV Tylenol was ordered with good effect.   Pt exits my care.

## 2021-04-28 NOTE — Anesthesia Procedure Notes (Addendum)
Procedure Name: Intubation Date/Time: 04/28/2021 4:26 PM Performed by: Dorthea Cove, CRNA Pre-anesthesia Checklist: Patient identified, Emergency Drugs available, Suction available and Patient being monitored Patient Re-evaluated:Patient Re-evaluated prior to induction Oxygen Delivery Method: Circle system utilized Preoxygenation: Pre-oxygenation with 100% oxygen Induction Type: IV induction Ventilation: Mask ventilation without difficulty Laryngoscope Size: Mac and 3 Grade View: Grade II Tube type: Oral Tube size: 7.0 mm Number of attempts: 1 Airway Equipment and Method: Stylet and Oral airway Placement Confirmation: ETT inserted through vocal cords under direct vision, positive ETCO2 and breath sounds checked- equal and bilateral Secured at: 22 cm Tube secured with: Tape Dental Injury: Teeth and Oropharynx as per pre-operative assessment

## 2021-04-28 NOTE — Transfer of Care (Signed)
Immediate Anesthesia Transfer of Care Note  Patient: [name]  Procedure(s) Performed: Left Percutaneous trigeminal balloon rhizotomy (Left: Head)  Patient Location: PACU  Anesthesia Type:General  Level of Consciousness: awake, alert  and oriented  Airway & Oxygen Therapy: Patient Spontanous Breathing  Post-op Assessment: Report given to RN and Post -op Vital signs reviewed and stable  Post vital signs: Reviewed and stable  Last Vitals:  Vitals Value Taken Time  BP 155/67 04/28/21 1742  Temp    Pulse 65 04/28/21 1746  Resp 13 04/28/21 1746  SpO2 98 % 04/28/21 1746  Vitals shown include unvalidated device data.  Last Pain:  Vitals:   04/28/21 1030  TempSrc: Oral         Complications: No notable events documented.

## 2021-04-28 NOTE — Anesthesia Preprocedure Evaluation (Addendum)
Anesthesia Evaluation  Patient identified by MRN, date of birth, ID band Patient awake    Reviewed: Allergy & Precautions, NPO status , Patient's Chart, lab work & pertinent test results  Airway Mallampati: II  TM Distance: >3 FB Neck ROM: Full    Dental no notable dental hx.    Pulmonary neg pulmonary ROS,    Pulmonary exam normal breath sounds clear to auscultation       Cardiovascular hypertension, Pt. on medications Normal cardiovascular exam Rhythm:Regular Rate:Normal     Neuro/Psych negative neurological ROS  negative psych ROS   GI/Hepatic Neg liver ROS, GERD  ,  Endo/Other  negative endocrine ROS  Renal/GU negative Renal ROS  negative genitourinary   Musculoskeletal negative musculoskeletal ROS (+)   Abdominal   Peds negative pediatric ROS (+)  Hematology negative hematology ROS (+)   Anesthesia Other Findings   Reproductive/Obstetrics negative OB ROS                            Anesthesia Physical Anesthesia Plan  ASA: 2  Anesthesia Plan: General   Post-op Pain Management:    Induction: Intravenous  PONV Risk Score and Plan: 3 and Ondansetron, Dexamethasone and Treatment may vary due to age or medical condition  Airway Management Planned: Oral ETT  Additional Equipment:   Intra-op Plan:   Post-operative Plan: Extubation in OR  Informed Consent: I have reviewed the patients History and Physical, chart, labs and discussed the procedure including the risks, benefits and alternatives for the proposed anesthesia with the patient or authorized representative who has indicated his/her understanding and acceptance.     Dental advisory given  Plan Discussed with: CRNA and Surgeon  Anesthesia Plan Comments:         Anesthesia Quick Evaluation

## 2021-04-28 NOTE — H&P (Signed)
Surgical H&P Update  HPI: 80 y.o. woman with a history of left sided trigeminal neuralgia. Here today for a left sided balloon trigeminal rhizotomy. Still having facial pain on the left, stable to when I saw her in clinic, no new right sided symptoms, facial pain has been worse recently due to the cold weather, otherwise stable.   PMHx:  Past Medical History:  Diagnosis Date   Adenomatous colon polyp 03/24/2018   Allergy    Arthritis    Cataract    Chronic headaches    Depression    Fibromyalgia    Gallstones    GERD (gastroesophageal reflux disease)    Hyperlipemia    Hypertension    Interstitial cystitis    Lactose intolerance    LBBB (left bundle branch block)    Osteoarthritis    Osteopenia    Pneumonia    Thyroid disease    Tinnitus    Trigeminal neuralgia    FamHx:  Family History  Problem Relation Age of Onset   Heart disease Mother    Heart disease Father    Stroke Father    Stroke Sister    Asthma Daughter    Colon cancer Neg Hx    SocHx:  reports that she has never smoked. She has never used smokeless tobacco. She reports that she does not drink alcohol and does not use drugs.  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 Risks, benefits, and alternatives discussed and the patient would like to continue with surgery.  -OR today -home from PACU post-op  Judith Part, MD 04/28/21 3:43 PM

## 2021-04-29 ENCOUNTER — Encounter (HOSPITAL_COMMUNITY): Payer: Self-pay | Admitting: Neurological Surgery

## 2021-04-29 DIAGNOSIS — M6281 Muscle weakness (generalized): Secondary | ICD-10-CM | POA: Diagnosis not present

## 2021-04-29 DIAGNOSIS — R42 Dizziness and giddiness: Secondary | ICD-10-CM | POA: Diagnosis not present

## 2021-04-29 DIAGNOSIS — Z20822 Contact with and (suspected) exposure to covid-19: Secondary | ICD-10-CM | POA: Diagnosis not present

## 2021-04-29 DIAGNOSIS — G500 Trigeminal neuralgia: Secondary | ICD-10-CM | POA: Diagnosis not present

## 2021-04-29 DIAGNOSIS — R2689 Other abnormalities of gait and mobility: Secondary | ICD-10-CM | POA: Diagnosis not present

## 2021-04-29 DIAGNOSIS — G501 Atypical facial pain: Secondary | ICD-10-CM | POA: Diagnosis not present

## 2021-04-29 MED ORDER — HYDROCODONE-ACETAMINOPHEN 5-325 MG PO TABS: 1.0000 | ORAL_TABLET | ORAL | 0 refills | Status: DC | PRN

## 2021-04-29 NOTE — Anesthesia Postprocedure Evaluation (Signed)
Anesthesia Post Note  Patient: [name]  Procedure(s) Performed: Left Percutaneous trigeminal balloon rhizotomy (Left: Head)     Patient location during evaluation: PACU Anesthesia Type: General Level of consciousness: awake and alert Pain management: pain level controlled Vital Signs Assessment: post-procedure vital signs reviewed and stable Respiratory status: spontaneous breathing, nonlabored ventilation, respiratory function stable and patient connected to nasal cannula oxygen Cardiovascular status: blood pressure returned to baseline and stable Postop Assessment: no apparent nausea or vomiting Anesthetic complications: no   No notable events documented.  Last Vitals:  Vitals:   04/29/21 0450 04/29/21 0746  BP: (!) 103/55 (!) 99/56  Pulse: 70 68  Resp: 18 16  Temp: 36.8 C 36.8 C  SpO2: 96% 95%    Last Pain:  Vitals:   04/29/21 0746  TempSrc: Oral  PainSc:                  Yeira Gulden S

## 2021-04-29 NOTE — Progress Notes (Signed)
PT Cancellation Note  Patient Details Name: [name] MRN: [id number] DOB: 1941/05/26   Cancelled Treatment:    Reason Eval/Treat Not Completed: PT screened, no needs identified, will sign off (pt independent).  Wyona Almas, PT, DPT Acute Rehabilitation Services Pager [phone] Office [phone]    Deno Etienne 04/29/2021, 8:32 AM

## 2021-04-29 NOTE — Plan of Care (Signed)
Pt and husband given D/C instructions with verbal understanding. Rx's were sent to the pharmacy by MD. Pt's IV was removed prior to D/C. Pt D/C'd home via wheelchair per MD order. Pt is stable @ D/C and has no other needs at this time. Holli Humbles, RN

## 2021-04-29 NOTE — Discharge Summary (Signed)
Discharge Summary  Date of Admission: 04/28/2021  Date of Discharge: 04/29/21  Attending Physician: Emelda Brothers, MD  Hospital Course: Patient was admitted following a left balloon trigeminal rhizotomy. She was recovered in PACU and transferred to [facility]. Her facial pain was unfortunately not improved post-op, her hospital course was uncomplicated and the patient was discharged home on 04/29/21. She will follow up in clinic for further care.  Neurologic exam at discharge:  AOx3, PERRL, EOMI, FS, TM Strength 5/5 x4  Discharge diagnosis: Atypical facial pain, left side   Judith Part, MD 04/29/21 10:38 AM

## 2021-04-29 NOTE — Progress Notes (Signed)
Neurosurgery Service Progress Note  Subjective: No acute events overnight, facial pain unfortunately unchanged  Objective: Vitals:   04/28/21 2032 04/28/21 2317 04/29/21 0450 04/29/21 0746  BP: 136/60 (!) 108/59 (!) 103/55 (!) 99/56  Pulse: 67 88 70 68  Resp: 16 18 18 16   Temp:  98.7 F (37.1 C) 98.2 F (36.8 C) 98.3 F (36.8 C)  TempSrc:  Oral Oral Oral  SpO2: 100% 92% 96% 95%  Weight:      Height:        Physical Exam: Aox3, PERRL, EOMI w/ conj gaze, stable left delayed eye closure, stable mild left facial numbness, face symmetric, strength 5/5x4  Assessment & Plan: 80 y.o. woman w/ left sided facial pain s/p L CN5 balloon rhizotomy.  -discharge home today  Judith Part  04/29/21 10:34 AM

## 2021-04-29 NOTE — Evaluation (Signed)
Occupational Therapy Evaluation Patient Details Name: [name] MRN: [id number] DOB: [birth year] Today's Date: 04/29/2021   History of Present Illness 80 year old woman who is now s/p left Percutaneous trigeminal balloon rhizotomy 11/8. PMH:  MVD with intra-op rhizotomy followed by 2 SRS rhizotomies, arthritis, cataract sx, fibromyalgia, hypertension, LBBB, OA, thyroid disease   Clinical Impression   [name] was mod I PTA, she lives in a 2 level home, 5 STE with bed/bath upstairs. Her husband can assist 24/7 and her son plans to come stay for awhile, and she has a daughter near by. Upon evaluation pt was mod I for bed mobility and supervision for all transfers and ADLs without AD. Pt was observed with mild tremor, and had some complaints of pain and dizziness. Recommend pt have close supervision A for all Adls and mobility at d/c. No OT follow up needed.      Recommendations for follow up therapy are one component of a multi-disciplinary discharge planning process, led by the attending physician.  Recommendations may be updated based on patient status, additional functional criteria and insurance authorization.   Follow Up Recommendations  No OT follow up    Assistance Recommended at Discharge Frequent or constant Supervision/Assistance  Functional Status Assessment  Patient has had a recent decline in their functional status and demonstrates the ability to make significant improvements in function in a reasonable and predictable amount of time.  Equipment Recommendations  None recommended by OT       Precautions / Restrictions Precautions Precautions: Fall Restrictions Weight Bearing Restrictions: No      Mobility Bed Mobility Overal bed mobility: Modified Independent             General bed mobility comments: +HOB elevated    Transfers Overall transfer level: Needs assistance   Transfers: Sit to/from Stand Sit to Stand: Supervision           General  transfer comment: for safety only      Balance Overall balance assessment: Needs assistance Sitting-balance support: Feet supported Sitting balance-Leahy Scale: Good     Standing balance support: No upper extremity supported;During functional activity Standing balance-Leahy Scale: Fair                             ADL either performed or assessed with clinical judgement   ADL Overall ADL's : At baseline;Modified independent       General ADL Comments: incrased time and superivison for all ADLs at this time, pt reoprts some dizziness and mild tremors (new). Pt stated that she will have her husband or children close by for all ADLs and mobility to incrsae safety and decrase fall risk.     Vision Baseline Vision/History: 4 Cataracts Ability to See in Adequate Light: 0 Adequate Patient Visual Report: No change from baseline Vision Assessment?: No apparent visual deficits            Pertinent Vitals/Pain Pain Assessment: Faces Faces Pain Scale: Hurts little more Pain Location: face, teeth/jaw Pain Descriptors / Indicators: Discomfort Pain Intervention(s): Monitored during session     Hand Dominance Right   Extremity/Trunk Assessment Upper Extremity Assessment Upper Extremity Assessment: Overall WFL for tasks assessed (mild resting tremor noted; limited finger to thumb coordination)   Lower Extremity Assessment Lower Extremity Assessment: Generalized weakness   Cervical / Trunk Assessment Cervical / Trunk Assessment: Kyphotic (mild)   Communication Communication Communication: No difficulties   Cognition Arousal/Alertness: Awake/alert Behavior During  Therapy: WFL for tasks assessed/performed Overall Cognitive Status: Within Functional Limits for tasks assessed           General Comments  VSS on RA, pt with some reports of dizziness and new mild tremor     Home Living Family/patient expects to be discharged to:: Private residence Living  Arrangements: Spouse/significant other Available Help at Discharge: Family;Available 24 hours/day Type of Home: House Home Access: Stairs to enter [employer] of Steps: 5 Entrance Stairs-Rails: None Home Layout: Two level;1/2 bath on main level Alternate Level Stairs-Number of Steps: flight   Bathroom Shower/Tub: Tub/shower unit;Curtain   Biochemist, clinical: Standard     Home Equipment: Conservation officer, nature (2 wheels);Shower seat   Additional Comments: Pt's son is coming to assist family from out of town, daughter close by, can borrow DME from son-in-law      Prior Functioning/Environment Prior Level of Function : Independent/Modified Independent             Mobility Comments: pt's husband would provide stand by assist for stairs due to pts dizziness ADLs Comments: indep        OT Problem List: Decreased activity tolerance;Impaired balance (sitting and/or standing);Pain         OT Goals(Current goals can be found in the care plan section) Acute Rehab OT Goals Patient Stated Goal: home soon OT Goal Formulation: All assessment and education complete, DC therapy   AM-PAC OT "6 Clicks" Daily Activity     Outcome Measure Help from another person eating meals?: None Help from another person taking care of personal grooming?: A Little Help from another person toileting, which includes using toliet, bedpan, or urinal?: A Little Help from another person bathing (including washing, rinsing, drying)?: A Little Help from another person to put on and taking off regular upper body clothing?: None Help from another person to put on and taking off regular lower body clothing?: A Little 6 Click Score: 20   End of Session Nurse Communication: Mobility status  Activity Tolerance: Patient tolerated treatment well Patient left: in bed  OT Visit Diagnosis: Other abnormalities of gait and mobility (R26.89);Muscle weakness (generalized) (M62.81);Pain;Dizziness and giddiness (R42)                 Time: 3403-5248 OT Time Calculation (min): 20 min Charges:  OT General Charges $OT Visit: 1 Visit OT Evaluation $OT Eval Low Complexity: 1 Low   Merdith Boyd A Tyjon Bowen 04/29/2021, 9:12 AM

## 2021-04-30 DIAGNOSIS — M25562 Pain in left knee: Secondary | ICD-10-CM | POA: Diagnosis not present

## 2021-04-30 DIAGNOSIS — M170 Bilateral primary osteoarthritis of knee: Secondary | ICD-10-CM | POA: Diagnosis not present

## 2021-04-30 DIAGNOSIS — M353 Polymyalgia rheumatica: Secondary | ICD-10-CM | POA: Diagnosis not present

## 2021-04-30 DIAGNOSIS — M25559 Pain in unspecified hip: Secondary | ICD-10-CM | POA: Diagnosis not present

## 2021-04-30 DIAGNOSIS — M797 Fibromyalgia: Secondary | ICD-10-CM | POA: Diagnosis not present

## 2021-04-30 DIAGNOSIS — M150 Primary generalized (osteo)arthritis: Secondary | ICD-10-CM | POA: Diagnosis not present

## 2021-04-30 DIAGNOSIS — M810 Age-related osteoporosis without current pathological fracture: Secondary | ICD-10-CM | POA: Diagnosis not present

## 2021-05-06 ENCOUNTER — Other Ambulatory Visit: Payer: Self-pay

## 2021-05-06 ENCOUNTER — Encounter
Payer: Medicare Other | Attending: Physical Medicine and Rehabilitation | Admitting: Physical Medicine and Rehabilitation

## 2021-05-06 ENCOUNTER — Encounter: Payer: Self-pay | Admitting: Physical Medicine and Rehabilitation

## 2021-05-06 VITALS — BP 138/77 | HR 82 | Temp 97.8°F | Ht 63.0 in | Wt 151.1 lb

## 2021-05-06 DIAGNOSIS — G894 Chronic pain syndrome: Secondary | ICD-10-CM | POA: Insufficient documentation

## 2021-05-06 DIAGNOSIS — Z5181 Encounter for therapeutic drug level monitoring: Secondary | ICD-10-CM | POA: Insufficient documentation

## 2021-05-06 DIAGNOSIS — G500 Trigeminal neuralgia: Secondary | ICD-10-CM | POA: Diagnosis not present

## 2021-05-06 DIAGNOSIS — R42 Dizziness and giddiness: Secondary | ICD-10-CM | POA: Diagnosis not present

## 2021-05-06 DIAGNOSIS — Z79891 Long term (current) use of opiate analgesic: Secondary | ICD-10-CM | POA: Diagnosis not present

## 2021-05-06 MED ORDER — MORPHINE SULFATE ER 15 MG PO TBCR: 15.0000 mg | EXTENDED_RELEASE_TABLET | Freq: Two times a day (BID) | ORAL | 0 refills | Status: DC

## 2021-05-06 NOTE — Addendum Note (Signed)
Addended by: Sharia Reeve on: 05/06/2021 12:31 PM   Modules accepted: Orders

## 2021-05-06 NOTE — Progress Notes (Addendum)
Subjective:    Patient ID: [name], female    DOB: [birth year], 80 y.o.   MRN: [id number]  HPI  [name] presents today for f/u of severe trigeminal neuralgia.   1) Severe trigeminal neuralgia: -she did find benefit from the compound cream- she continues to use this.  -She continues to have a sharp shooting pain doing around her eyelid and the tip teeth and in the top of her head.  -has a radiofrequency ablation treatment that helped but not completely -she is not having as much sharp knife-like pin like in her eye Her top teeth in her left law have been hurting really badly.  -she now takes the baclofen with the gabapentin and they seem to work well together.  -she had surgical procedure and feels worse -she is still taking the Norco that Dr. Joaquim Nam prescribed -yesterday she took a half a tablet but this morning she took a whole one. She does fine with the hydrocodone but a whole one makes her too sleepy.  -she is taking 2 100mg  Gabapentin three times per day but it makes her dizzy and sleepy so she cannot funciton -the cold worsens her pain.  -she did get one week benefit from the steroid injection.  -her daughter accompanies her.   2) Fibromyalgia: -She has been almost nonfunctional due to the severity of her pain. It continues to be very severe.  -She would like to wean off the Gabapentin as she feels at risk of falls -she is taking Topamax BID. She felt this was helpful but made her unsteady on her feet.   3) Osteoporosis: -husband asks whether she should get a Prolia shot.   4) Dizziness: -head was swimming -she felt like she was going to fall.   -She tried the Phenytoin for 2 days and did not feel benefit so stopped it.   -she feels the gabapentin aggravates this and she would love to wean off but knows it helps her.   -She feels very sleepy during the day.   -She has never tried Topirmate before.   Prior history:  Her pain continues to be in her left  eye, forehead. She has not tried any topicals for pain. She uses capsaicin for her back and knees.   Since last visit nothing we have tried has helped with her pain. She has tried to decrease her Gabapentin dose but then her pain increased so she has resumed taking 4 Gabapentin per day.   Her husband asks whether he should contact her surgeon regarding whether nerve may have become unclamped.   Discussed prolotherapy, trigger point injections, Baclofen, Clonazepam, Phenytoin as other options we could try,=.  Pain continues to be excruciating and she states she is not sure how much longer she can live like this.   Prior history: She has having some nausea, lightheadedness and felt this may be related to her Gabapentin. She did not take any yesterday and then felt more sever pain in the evening. She also gets electrical shooting across her jaw.   She does not feel that she has cluster headaches as the pain has been so constant. She feels that she is getting worse. The oxygen made her feel relaxed but it is not helping.   The Sarna lotion does help with the itching in her head.   She was willing to try the Amitriptyline but it makes her too sleepy in the morning.   Her teeth and left eye incredibly hurt.  She feels that the Gabapentin has been making her dizziness.   She asks about biofeedback.   The pain is 7/10 on average, similar to last time.   When the pain is severe it can make her depressed.   Pain Inventory Average Pain 8 Pain Right Now 9 My pain is constant, sharp, burning, dull, stabbing, tingling, and aching  In the last 24 hours, has pain interfered with the following? General activity 10 Relation with others 9 Enjoyment of life 10 What TIME of day is your pain at its worst? morning  and night Sleep (in general) Good  Pain is worse with: bending and standing Pain improves with: heat/ice and medication Relief from Meds: 5  Family History  Problem Relation Age of  Onset   Heart disease Mother    Heart disease Father    Stroke Father    Stroke Sister    Asthma Daughter    Colon cancer Neg Hx    Social History   Socioeconomic History   Marital status: Married    Spouse name: Not on file   Number of children: 2   Years of education: college   Highest education level: Not on file  Occupational History   Occupation: housewife    Employer: RETIRED  Tobacco Use   Smoking status: Never   Smokeless tobacco: Never  Vaping Use   Vaping Use: Never used  Substance and Sexual Activity   Alcohol use: No    Alcohol/week: 0.0 standard drinks   Drug use: No   Sexual activity: Not on file  Other Topics Concern   Not on file  Social History Narrative   1 cup of Jasmine tea daily    Social Determinants of Health   Financial Resource Strain: Not on file  Food Insecurity: Not on file  Transportation Needs: Not on file  Physical Activity: Not on file  Stress: Not on file  Social Connections: Not on file   Past Surgical History:  Procedure Laterality Date   CHOLECYSTECTOMY  03/2010   COLONOSCOPY  2009   Dr. Raford Pitcher    CYSTOSCOPY     Multiple Cystoscopies with stents or biopsy    ESOPHAGOGASTRODUODENOSCOPY  10/2013   EYE SURGERY     bilateral cataract removal   Gamma knife radiation  01/2018   for trigeminal neuralgia   REFRACTIVE SURGERY Bilateral 2022   RHIZOTOMY Left 04/28/2021   Procedure: Left Percutaneous trigeminal balloon rhizotomy;  Surgeon: Judith Part, MD;  Location: [facility];  Service: Neurosurgery;  Laterality: Left;   TONSILLECTOMY     Past Surgical History:  Procedure Laterality Date   CHOLECYSTECTOMY  03/2010   COLONOSCOPY  2009   Dr. Raford Pitcher    CYSTOSCOPY     Multiple Cystoscopies with stents or biopsy    ESOPHAGOGASTRODUODENOSCOPY  10/2013   EYE SURGERY     bilateral cataract removal   Gamma knife radiation  01/2018   for trigeminal neuralgia   REFRACTIVE SURGERY Bilateral 2022   RHIZOTOMY Left  04/28/2021   Procedure: Left Percutaneous trigeminal balloon rhizotomy;  Surgeon: Judith Part, MD;  Location: [facility];  Service: Neurosurgery;  Laterality: Left;   TONSILLECTOMY     Past Medical History:  Diagnosis Date   Adenomatous colon polyp 03/24/2018   Allergy    Arthritis    Cataract    Chronic headaches    Depression    Fibromyalgia    Gallstones    GERD (gastroesophageal reflux disease)    Hyperlipemia  Hypertension    Interstitial cystitis    Lactose intolerance    LBBB (left bundle branch block)    Osteoarthritis    Osteopenia    Pneumonia    Thyroid disease    Tinnitus    Trigeminal neuralgia    BP 138/77   Pulse 82   Temp 97.8 F (36.6 C)   Ht 5\' 3"  (1.6 m)   Wt 151 lb 1.6 oz (68.5 kg)   SpO2 98%   BMI 26.77 kg/m   Opioid Risk Score:   Fall Risk Score:  `1  Depression screen PHQ 2/9  Depression screen [facility] 2/9 10/28/2020 06/26/2020 05/14/2020 02/14/2020 02/07/2020 06/30/2016 06/30/2016  Decreased Interest 0 0 0 0 3 0 0  Down, Depressed, Hopeless 0 0 0 0 2 0 0  PHQ - 2 Score 0 0 0 0 5 0 0  Altered sleeping - - - - 0 - -  Tired, decreased energy - - - - 3 - -  Change in appetite - - - - 0 - -  Feeling bad or failure about yourself  - - - - 0 - -  Trouble concentrating - - - - 0 - -  Moving slowly or fidgety/restless - - - - 0 - -  Suicidal thoughts - - - - 0 - -  PHQ-9 Score - - - - 8 - -  Difficult doing work/chores - - - - Very difficult - -    Review of Systems  Constitutional: Negative.   HENT: Negative.    Eyes:  Positive for visual disturbance.  Respiratory: Negative.    Cardiovascular: Negative.   Gastrointestinal: Negative.   Endocrine: Negative.   Genitourinary: Negative.   Musculoskeletal: Negative.        Pain in left side of face  Skin: Negative.   Allergic/Immunologic: Negative.   Neurological:  Positive for dizziness, tremors, light-headedness and numbness.       Tingling   Hematological: Negative.    Psychiatric/Behavioral: Negative.    All other systems reviewed and are negative.     Objective:   Physical Exam Gen: no distress, normal appearing HEENT: oral mucosa pink and moist, NCAT Cardio: Reg rate Chest: normal effort, normal rate of breathing Abd: soft, non-distended Ext: no edema Psych: pleasant, normal affect Skin: intact Neuro: Alert and oriented x3. Left sided decreased sensation in upper, middle, and lower portion of face. Left sided facial droop. Psych: pleasant, normal affect    Assessment & Plan:  [name] is an 80 year old woman who presents for f/u of left sided trigeminal neuralgia and dizziness.    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 She does have a history of migraines.  -reviewed patient's medical notes -Discussed wean off Gabapentin -d/c topamax -will avoid any future procedures -discussed benefits of infrared light.  -MS contin prescribed  -d/c Baclofen  -f/u with Dr. Joaquim Nam.  -encouraged anti-inflammatory diet.  -she had sinusitis that was viral and was treated with antibiotics. This was in the early 58s. The symptoms resolved after 2 months. She uses to get sinus infections and saline rinse.  -She has had multiple root canals.  -Discontinue 100% oxygen via facemask as this was not helpful.  -Discussed that unfortunately Sprint PNS is not indicated for craniofacial pain. -Amitriptyline and Lyrica did not help.  -Continue Cymbalta t60mg .  -Topiramate helped but caused dizziness.  -Provided with hand out with information regarding trigeminal neuralgia -  Retry the Phenytoin.  -She would like to try biofeedback. -reviewed neurology note.  -will try trigger point injections next visit.  -Continue Ketamine 10%, Baclofen 2%, Cyclobenzaprine 2%, Ketoprofen 10%, Gabapentin 6%, Bupivacaine 1%, Amitryptiline 5% to apply to painful areas- using 3-4 times per day.  -Can consider  Meloxicam if Phenytoin does not help.  -Discussed Qutenza as an option for neuropathic pain control. Discussed that this is a capsaicin patch, stronger than capsaicin cream. Discussed that it is currently approved for diabetic peripheral neuropathy and post-herpetic neuralgia, but that it has also shown benefit in treating other forms of neuropathy. Provided patient with link to site to learn more about the patch: [employer]. Discussed that the patch would be placed in office and benefits usually last 3 months. Discussed that unintended exposure to capsaicin can cause severe irritation of eyes, mucous membranes, respiratory tract, and skin, but that Qutenza is a local treatment and does not have the systemic side effects of other nerve medications. Discussed that there may be pain, itching, erythema, and decreased sensory function associated with the application of Qutenza. Side effects usually subside within 1 week. A cold pack of analgesic medications can help with these side effects. Blood pressure can also be increased due to pain associated with administration of the patch.  -she cut down on her gabapentin due to her dizziness    Turmeric to reduce inflammation--can be used in cooking or taken as a supplement.  Benefits of turmeric:  -Highly anti-inflammatory  -Increases antioxidants  -Improves memory, attention, brain disease  -Lowers risk of heart disease  -May help prevent cancer  -Decreases pain  -Alleviates depression  -Delays aging and decreases risk of chronic disease  -Consume with black pepper to increase absorption    Turmeric Milk Recipe:  1 cup milk  1 tsp turmeric  1 tsp cinnamon  1 tsp grated ginger (optional)  Black pepper (boosts the anti-inflammatory properties of turmeric).  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  -she is a good metabolizer of amitriptyline and has tolerated nortrypilline in the past, but without much benefit.    3) AKI: encouraged hydration. She states that she had AKI with Ibuprofen in the past. Discussed checking Cr level next visit if we are considering Meloxicam   4) Hypernatremia: encouraged hydration   5) General health and pain: provided with list of healthy foods that are both nutritious and fight pain.   6) Depressed: Amitriptyline did help with this but she could not tolerate the drowsiness. Encouraged focusing on the factors that she can control, such as anti-inflammatory diet.   7) Fibromyalgia: Continue 30mg  of steroids per day for fibromyalgia. She is on that intermittently. She started this again about a month ago. She has been on this about a year.  8) Dizziness: -discussed that this could be from the combination of gabapentin and baclofen.  -discussed wean off gabapentin  9) Constipation:  -Provided list of following foods that help  with constipation and highlighted a few: 1) prunes- contain high amounts of fiber.  2) apples- has a form of dietary fiber called pectin that accelerates stool movement and increases beneficial gut bacteria 3) pears- in addition to fiber, also high in fructose and sorbitol which have laxative effect 4) figs- contain an enzyme ficin which helps to speed colonic transit 5) kiwis- contain an enzyme actinidin that improves gut motility and reduces constipation 6) oranges- rich in  pectin (like apples) 7) grapefruits- contain a flavanol naringenin which has a laxative effect 8) vegetables- rich in fiber and also great sources of folate, vitamin C, and K 9) artichoke- high in inulin, prebiotic great for the microbiome 10) chicory- increases stool frequency and softness (can be added to coffee) 11) rhubarb- laxative effect 12) sweet potato- high fiber 13) beans, peas, and lentils- contain both soluble and insoluble fiber 14) chia seeds- improves intestinal health and gut flora 15) flaxseeds- laxative effect 16) whole grain rye bread- high in fiber 17) oat bran- high in soluble and insoluble fiber 18) kefir- softens stools -recommended to try at least one of these foods every day.  -drink 6-8 glasses of water per day -walk regularly, especially after meals.       All questions answered. RTC in 1 month.

## 2021-05-06 NOTE — Patient Instructions (Addendum)
Nucy[place]ta  Constipation:  -Provided list of following foods that help with constipation and highlighted a few: 1) prunes- contain high amounts of fiber.  2) apples- has a form of dietary fiber called pectin that accelerates stool movement and increases beneficial gut bacteria 3) pears- in addition to fiber, also high in fructose and sorbitol which have laxative effect 4) figs- contain an enzyme ficin which helps to speed colonic transit 5) kiwis- contain an enzyme actinidin that improves gut motility and reduces constipation 6) oranges- rich in pectin (like apples) 7) grapefruits- contain a flavanol naringenin which has a laxative effect 8) vegetables- rich in fiber and also great sources of folate, vitamin C, and K 9) artichoke- high in inulin, prebiotic great for the microbiome 10) chicory- increases stool frequency and softness (can be added to coffee) 11) rhubarb- laxative effect 12) sweet potato- high fiber 13) beans, peas, and lentils- contain both soluble and insoluble fiber 14) chia seeds- improves intestinal health and gut flora 15) flaxseeds- laxative effect 16) whole grain rye bread- high in fiber 17) oat bran- high in soluble and insoluble fiber 18) kefir- softens stools -recommended to try at least one of these foods every day.  -drink 6-8 glasses of water per day -walk regularly, especially after meals.

## 2021-05-07 ENCOUNTER — Other Ambulatory Visit: Payer: Self-pay | Admitting: Physical Medicine and Rehabilitation

## 2021-05-07 DIAGNOSIS — R300 Dysuria: Secondary | ICD-10-CM | POA: Diagnosis not present

## 2021-05-07 DIAGNOSIS — I10 Essential (primary) hypertension: Secondary | ICD-10-CM | POA: Diagnosis not present

## 2021-05-09 ENCOUNTER — Other Ambulatory Visit: Payer: Self-pay

## 2021-05-09 ENCOUNTER — Other Ambulatory Visit: Payer: Self-pay | Admitting: Physical Medicine and Rehabilitation

## 2021-05-09 ENCOUNTER — Encounter (HOSPITAL_BASED_OUTPATIENT_CLINIC_OR_DEPARTMENT_OTHER): Payer: Medicare Other | Admitting: Physical Medicine and Rehabilitation

## 2021-05-09 DIAGNOSIS — G500 Trigeminal neuralgia: Secondary | ICD-10-CM

## 2021-05-09 MED ORDER — TAPENTADOL HCL 50 MG PO TABS: 50.0000 mg | ORAL_TABLET | Freq: Every day | ORAL | 0 refills | Status: DC

## 2021-05-09 NOTE — Progress Notes (Signed)
Subjective:    Patient ID: [name], female    DOB: 02/01/1941, 80 y.o.   MRN: [id number]  HPI  An audio/video tele-health visit is felt to be the most appropriate encounter for this patient at this time. This is a follow up tele-visit via phone. The patient is at home. MD is at office.    [name] presents today for f/u of severe trigeminal neuralgia.   1) Severe trigeminal neuralgia: -she has had some benefit from the long acting morphine- it was more beneficial than the hydrocodone and has not made her sleepy. She has stopped taking the Hydrocodone -she did have some scalp itching which I told her may be related to the morphine- she has been applying Sarna lotion which has helped -pain is still severe and she would like to try Nucynta as an alternative to the Morphine.  -she did find benefit from the compound cream- she continues to use this.  -She continues to have a sharp shooting pain doing around her eyelid and the tip teeth and in the top of her head.  -has a radiofrequency ablation treatment that helped but not completely -she is not having as much sharp knife-like pin like in her eye Her top teeth in her left law have been hurting really badly.  -she now takes the baclofen with the gabapentin and they seem to work well together.  -she had surgical procedure and feels worse -yesterday she took a half a tablet but this morning she took a whole one. She does fine with the hydrocodone but a whole one makes her too sleepy.  -she is taking 2 100mg  Gabapentin three times per day but it makes her dizzy and sleepy so she cannot funciton -the cold worsens her pain.  -she did get one week benefit from the steroid injection.  -her daughter accompanies her.   2) Fibromyalgia: -She has been almost nonfunctional due to the severity of her pain. It continues to be very severe.  -She would like to wean off the Gabapentin as she feels at risk of falls -she is taking Topamax BID.  She felt this was helpful but made her unsteady on her feet.   3) Osteoporosis: -husband asks whether she should get a Prolia shot.   4) Dizziness: -head was swimming -she felt like she was going to fall.   -She tried the Phenytoin for 2 days and did not feel benefit so stopped it.   -she feels the gabapentin aggravates this and she would love to wean off but knows it helps her.   -She feels very sleepy during the day.   -She has never tried Topirmate before.   Prior history:  Her pain continues to be in her left eye, forehead. She has not tried any topicals for pain. She uses capsaicin for her back and knees.   Since last visit nothing we have tried has helped with her pain. She has tried to decrease her Gabapentin dose but then her pain increased so she has resumed taking 4 Gabapentin per day.   Her husband asks whether he should contact her surgeon regarding whether nerve may have become unclamped.   Discussed prolotherapy, trigger point injections, Baclofen, Clonazepam, Phenytoin as other options we could try,=.  Pain continues to be excruciating and she states she is not sure how much longer she can live like this.   Prior history: She has having some nausea, lightheadedness and felt this may be related to her Gabapentin. She  did not take any yesterday and then felt more sever pain in the evening. She also gets electrical shooting across her jaw.   She does not feel that she has cluster headaches as the pain has been so constant. She feels that she is getting worse. The oxygen made her feel relaxed but it is not helping.   The Sarna lotion does help with the itching in her head.   She was willing to try the Amitriptyline but it makes her too sleepy in the morning.   Her teeth and left eye incredibly hurt.   She feels that the Gabapentin has been making her dizziness.   She asks about biofeedback.   The pain is 7/10 on average, similar to last time.   When the pain is  severe it can make her depressed.   Pain Inventory Average Pain 8 Pain Right Now 9 My pain is constant, sharp, burning, dull, stabbing, tingling, and aching  In the last 24 hours, has pain interfered with the following? General activity 10 Relation with others 9 Enjoyment of life 10 What TIME of day is your pain at its worst? morning  and night Sleep (in general) Good  Pain is worse with: bending and standing Pain improves with: heat/ice and medication Relief from Meds: 5  Family History  Problem Relation Age of Onset   Heart disease Mother    Heart disease Father    Stroke Father    Stroke Sister    Asthma Daughter    Colon cancer Neg Hx    Social History   Socioeconomic History   Marital status: Married    Spouse name: Not on file   Number of children: 2   Years of education: college   Highest education level: Not on file  Occupational History   Occupation: housewife    Employer: RETIRED  Tobacco Use   Smoking status: Never   Smokeless tobacco: Never  Vaping Use   Vaping Use: Never used  Substance and Sexual Activity   Alcohol use: No    Alcohol/week: 0.0 standard drinks   Drug use: No   Sexual activity: Not on file  Other Topics Concern   Not on file  Social History Narrative   1 cup of Jasmine tea daily    Social Determinants of Health   Financial Resource Strain: Not on file  Food Insecurity: Not on file  Transportation Needs: Not on file  Physical Activity: Not on file  Stress: Not on file  Social Connections: Not on file   Past Surgical History:  Procedure Laterality Date   CHOLECYSTECTOMY  03/2010   COLONOSCOPY  2009   Dr. Raford Pitcher    CYSTOSCOPY     Multiple Cystoscopies with stents or biopsy    ESOPHAGOGASTRODUODENOSCOPY  10/2013   EYE SURGERY     bilateral cataract removal   Gamma knife radiation  01/2018   for trigeminal neuralgia   REFRACTIVE SURGERY Bilateral 2022   RHIZOTOMY Left 04/28/2021   Procedure: Left Percutaneous  trigeminal balloon rhizotomy;  Surgeon: Judith Part, MD;  Location: [facility];  Service: Neurosurgery;  Laterality: Left;   TONSILLECTOMY     Past Surgical History:  Procedure Laterality Date   CHOLECYSTECTOMY  03/2010   COLONOSCOPY  2009   Dr. Raford Pitcher    CYSTOSCOPY     Multiple Cystoscopies with stents or biopsy    ESOPHAGOGASTRODUODENOSCOPY  10/2013   EYE SURGERY     bilateral cataract removal   Gamma knife radiation  01/2018   for trigeminal neuralgia   REFRACTIVE SURGERY Bilateral 2022   RHIZOTOMY Left 04/28/2021   Procedure: Left Percutaneous trigeminal balloon rhizotomy;  Surgeon: Judith Part, MD;  Location: [facility];  Service: Neurosurgery;  Laterality: Left;   TONSILLECTOMY     Past Medical History:  Diagnosis Date   Adenomatous colon polyp 03/24/2018   Allergy    Arthritis    Cataract    Chronic headaches    Depression    Fibromyalgia    Gallstones    GERD (gastroesophageal reflux disease)    Hyperlipemia    Hypertension    Interstitial cystitis    Lactose intolerance    LBBB (left bundle branch block)    Osteoarthritis    Osteopenia    Pneumonia    Thyroid disease    Tinnitus    Trigeminal neuralgia    There were no vitals taken for this visit.  Opioid Risk Score:   Fall Risk Score:  `1  Depression screen PHQ 2/9  Depression screen [facility] 2/9 05/06/2021 10/28/2020 06/26/2020 05/14/2020 02/14/2020 02/07/2020 06/30/2016  Decreased Interest 0 0 0 0 0 3 0  Down, Depressed, Hopeless - 0 0 0 0 2 0  PHQ - 2 Score 0 0 0 0 0 5 0  Altered sleeping 0 - - - - 0 -  Tired, decreased energy - - - - - 3 -  Change in appetite - - - - - 0 -  Feeling bad or failure about yourself  - - - - - 0 -  Trouble concentrating - - - - - 0 -  Moving slowly or fidgety/restless - - - - - 0 -  Suicidal thoughts - - - - - 0 -  PHQ-9 Score 0 - - - - 8 -  Difficult doing work/chores - - - - - Very difficult -    Review of Systems  Constitutional: Negative.   HENT:  Negative.    Eyes:  Positive for visual disturbance.  Respiratory: Negative.    Cardiovascular: Negative.   Gastrointestinal: Negative.   Endocrine: Negative.   Genitourinary: Negative.   Musculoskeletal: Negative.        Pain in left side of face  Skin: Negative.   Allergic/Immunologic: Negative.   Neurological:  Positive for dizziness, tremors, light-headedness and numbness.       Tingling   Hematological: Negative.   Psychiatric/Behavioral: Negative.    All other systems reviewed and are negative.     Objective:   Physical Exam Not performed as patient was seen via phone.     Assessment & Plan:  Mrs. Erdmann is an 80 year old woman who presents for f/u of left sided trigeminal neuralgia and dizziness.    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 She does have a history of migraines.  -reviewed patient's medical notes -Discussed wean off Gabapentin -d/c topamax -will avoid any future procedures -discussed benefits of infrared light.  -MS contin prescribed and this has given more benefit than other interventions. It appears to have caused itching in her scalp and Sarna lotion has helped with this. Pain is still severe. Prescribed Nucynta but pharamacy says not covered- have asked them if we can complete a prior auth.  -d/c Baclofen  -f/u with Dr. Joaquim Nam.  -encouraged anti-inflammatory diet.  -she had sinusitis that was viral and was treated with antibiotics. This was in the early 81s. The symptoms resolved after  2 months. She uses to get sinus infections and saline rinse.  -She has had multiple root canals.  -Discontinue 100% oxygen via facemask as this was not helpful.  -Discussed that unfortunately [employer] PNS is not indicated for craniofacial pain. -Amitriptyline and Lyrica did not help.  -Continue Cymbalta t60mg .  -Topiramate helped but caused dizziness.  -Provided with hand out with information  regarding trigeminal neuralgia -Retry the Phenytoin.  -She would like to try biofeedback. -reviewed neurology note.  -will try trigger point injections next visit.  -Continue Ketamine 10%, Baclofen 2%, Cyclobenzaprine 2%, Ketoprofen 10%, Gabapentin 6%, Bupivacaine 1%, Amitryptiline 5% to apply to painful areas- using 3-4 times per day.  -Can consider Meloxicam if Phenytoin does not help.  -Discussed Qutenza as an option for neuropathic pain control. Discussed that this is a capsaicin patch, stronger than capsaicin cream. Discussed that it is currently approved for diabetic peripheral neuropathy and post-herpetic neuralgia, but that it has also shown benefit in treating other forms of neuropathy. Provided patient with link to site to learn more about the patch: [employer]. Discussed that the patch would be placed in office and benefits usually last 3 months. Discussed that unintended exposure to capsaicin can cause severe irritation of eyes, mucous membranes, respiratory tract, and skin, but that Qutenza is a local treatment and does not have the systemic side effects of other nerve medications. Discussed that there may be pain, itching, erythema, and decreased sensory function associated with the application of Qutenza. Side effects usually subside within 1 week. A cold pack of analgesic medications can help with these side effects. Blood pressure can also be increased due to pain associated with administration of the patch.  -she cut down on her gabapentin due to her dizziness    Turmeric to reduce inflammation--can be used in cooking or taken as a supplement.  Benefits of turmeric:  -Highly anti-inflammatory  -Increases antioxidants  -Improves memory, attention, brain disease  -Lowers risk of heart disease  -May help prevent cancer  -Decreases pain  -Alleviates depression  -Delays aging and decreases risk of chronic disease  -Consume with black pepper to increase  absorption    Turmeric Milk Recipe:  1 cup milk  1 tsp turmeric  1 tsp cinnamon  1 tsp grated ginger (optional)  Black pepper (boosts the anti-inflammatory properties of turmeric).  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  -she is a good metabolizer of amitriptyline and has tolerated nortrypilline in the past, but without much benefit.    3) CKD: encouraged 608 glasses of water per day. She states that she had AKI with Ibuprofen in the past. Discussed checking Cr level next visit if we are considering Meloxicam -Discussed that Nucynta, and other pain medications, may stay in her bloodstream longer given her CKD   4) Hypernatremia: encouraged hydration   5) General health and pain: provided with list of healthy foods that are both nutritious and fight pain.   6) Depressed: Amitriptyline  did help with this but she could not tolerate the drowsiness. Encouraged focusing on the factors that she can control, such as anti-inflammatory diet.   7) Fibromyalgia: Continue 30mg  of steroids per day for fibromyalgia. She is on that intermittently. She started this again about a month ago. She has been on this about a year.  8) Dizziness: -discussed that this could be from the combination of gabapentin and baclofen.  -discussed wean off gabapentin  9) Constipation:  -Provided list of following foods that help  with constipation and highlighted a few: 1) prunes- contain high amounts of fiber.  2) apples- has a form of dietary fiber called pectin that accelerates stool movement and increases beneficial gut bacteria 3) pears- in addition to fiber, also high in fructose and sorbitol which have laxative effect 4) figs- contain an enzyme ficin which helps to speed colonic transit 5) kiwis- contain an enzyme actinidin that improves gut motility and reduces constipation 6) oranges- rich in pectin (like apples) 7) grapefruits- contain a flavanol naringenin which has a laxative effect 8) vegetables- rich in fiber and also great sources of folate, vitamin C, and K 9) artichoke- high in inulin, prebiotic great for the microbiome 10) chicory- increases stool frequency and softness (can be added to coffee) 11) rhubarb- laxative effect 12) sweet potato- high fiber 13) beans, peas, and lentils- contain both soluble and insoluble fiber 14) chia seeds- improves intestinal health and gut flora 15) flaxseeds- laxative effect 16) whole grain rye bread- high in fiber 17) oat bran- high in soluble and insoluble fiber 18) kefir- softens stools -recommended to try at least one of these foods every day.  -drink 6-8 glasses of water per day -walk regularly, especially after meals.       All questions answered. RTC in 1 month.   13 minutes spent in discussion of her pain, response to morphine, discussion of nucynta and potential for magnified effects given her worsening creatinine function on recent labs, recommended drinking 6-8 glasses of water per day, discussing cost of Nucynta, use of the cost savings card, if too expensive considering increased dose of morphine instead since she is tolerating this well

## 2021-05-11 LAB — DRUG TOX MONITOR 1 W/CONF, ORAL FLD
Amphetamines: NEGATIVE ng/mL (ref <10)
Barbiturates: NEGATIVE ng/mL (ref <10)
Benzodiazepines: NEGATIVE ng/mL (ref <0.50)
Buprenorphine: NEGATIVE ng/mL (ref <0.10)
Buprenorphine: NEGATIVE ng/mL (ref <0.10)
Cocaine: NEGATIVE ng/mL (ref <5.0)
Codeine: NEGATIVE ng/mL (ref <2.5)
Dihydrocodeine: 25 ng/mL — ABNORMAL HIGH (ref <2.5)
Fentanyl: NEGATIVE ng/mL (ref <0.10)
Heroin Metabolite: NEGATIVE ng/mL (ref <1.0)
Hydrocodone: >250 ng/mL — ABNORMAL HIGH (ref <2.5)
Hydromorphone: NEGATIVE ng/mL (ref <2.5)
MARIJUANA: NEGATIVE ng/mL (ref <2.5)
MDMA: NEGATIVE ng/mL (ref <10)
Meprobamate: NEGATIVE ng/mL (ref <2.5)
Methadone: NEGATIVE ng/mL (ref <5.0)
Morphine: NEGATIVE ng/mL (ref <2.5)
Naloxone: NEGATIVE ng/mL (ref <0.25)
Nicotine Metabolite: NEGATIVE ng/mL (ref <5.0)
Norbuprenorphine: NEGATIVE ng/mL (ref <0.50)
Norhydrocodone: 14.9 ng/mL — ABNORMAL HIGH (ref <2.5)
Noroxycodone: NEGATIVE ng/mL (ref <2.5)
Opiates: POSITIVE ng/mL — AB (ref <2.5)
Oxycodone: NEGATIVE ng/mL (ref <2.5)
Oxymorphone: NEGATIVE ng/mL (ref <2.5)
Phencyclidine: NEGATIVE ng/mL (ref <10)
Tapentadol: NEGATIVE ng/mL (ref <5.0)
Tramadol: NEGATIVE ng/mL (ref <5.0)
Zolpidem: NEGATIVE ng/mL (ref <5.0)

## 2021-05-11 LAB — DRUG TOX ALC METAB W/CON, ORAL FLD: Alcohol Metabolite: NEGATIVE ng/mL (ref <25)

## 2021-05-13 ENCOUNTER — Telehealth: Payer: Self-pay | Admitting: *Deleted

## 2021-05-13 ENCOUNTER — Telehealth: Payer: Self-pay

## 2021-05-13 ENCOUNTER — Other Ambulatory Visit: Payer: Self-pay | Admitting: Physical Medicine and Rehabilitation

## 2021-05-13 NOTE — Telephone Encounter (Signed)
PA has been submitted for Nucynta

## 2021-05-13 NOTE — Telephone Encounter (Signed)
Oral swab drug screen was consistent for prescribed medications.  ?

## 2021-05-14 ENCOUNTER — Telehealth: Payer: Self-pay

## 2021-05-14 DIAGNOSIS — U071 COVID-19: Secondary | ICD-10-CM | POA: Diagnosis not present

## 2021-05-14 MED ORDER — TAPENTADOL HCL 50 MG PO TABS: 50.0000 mg | ORAL_TABLET | Freq: Every day | ORAL | 0 refills | Status: DC

## 2021-05-14 NOTE — Addendum Note (Signed)
Addended by: Alger Simons T on: 05/14/2021 03:47 PM   Modules accepted: Orders

## 2021-05-14 NOTE — Telephone Encounter (Signed)
[employer] approved Nucynta 05/14/21-05/13/22

## 2021-05-14 NOTE — Telephone Encounter (Signed)
Patient's husband Darnell Level is calling concerning Nucynta prescription. He stated he can do the [place] card for a 14 day supply of the Nucynta to [facility] on [address]. Informed him that a PA was submitted on 05/13/21 for the [place] and we are not sure if the medication will be approved. He is wanting the patient to try the 14 day supply. Informed patient that Dr. Ranell Patrick is out of the office and I would have to send to another doctor. He stated if the doctor was snot comfortable sending the Nucynta, that she has a prescription for the Norco and only has enough pills for today. Would you please address in Dr. Aline August absence?

## 2021-05-14 NOTE — Telephone Encounter (Signed)
Error

## 2021-05-14 NOTE — Telephone Encounter (Signed)
Patient's husband called to state he did not need the Nucynta sent to [facility]. He stated that [facility] could split the 30 day prescription to 14 days and he could pay for it with the savings card. Called [facility] to cancel Nucynta.

## 2021-05-14 NOTE — Telephone Encounter (Addendum)
#  14 nucynta sent to pharmacy at [facility] [address]. There is only one [facility] listed on [address]

## 2021-05-19 ENCOUNTER — Telehealth: Payer: Self-pay | Admitting: Physical Medicine and Rehabilitation

## 2021-05-19 ENCOUNTER — Encounter: Payer: Self-pay | Admitting: Physical Medicine and Rehabilitation

## 2021-05-19 ENCOUNTER — Other Ambulatory Visit: Payer: Self-pay | Admitting: Physical Medicine and Rehabilitation

## 2021-05-19 MED ORDER — HYDROCODONE-ACETAMINOPHEN 5-325 MG PO TABS: 1.0000 | ORAL_TABLET | ORAL | 0 refills | Status: DC | PRN

## 2021-05-19 NOTE — Telephone Encounter (Signed)
Patient called and wants dr. Ranell Patrick to call her - she said is in immense pain  her phone [phone]

## 2021-05-20 ENCOUNTER — Other Ambulatory Visit: Payer: Self-pay

## 2021-05-20 ENCOUNTER — Encounter (HOSPITAL_BASED_OUTPATIENT_CLINIC_OR_DEPARTMENT_OTHER): Payer: Medicare Other | Admitting: Physical Medicine and Rehabilitation

## 2021-05-20 ENCOUNTER — Encounter: Payer: Self-pay | Admitting: Physical Medicine and Rehabilitation

## 2021-05-20 DIAGNOSIS — G500 Trigeminal neuralgia: Secondary | ICD-10-CM

## 2021-05-20 DIAGNOSIS — M150 Primary generalized (osteo)arthritis: Secondary | ICD-10-CM | POA: Diagnosis not present

## 2021-05-20 DIAGNOSIS — E876 Hypokalemia: Secondary | ICD-10-CM | POA: Diagnosis not present

## 2021-05-20 MED ORDER — HYDROCODONE-ACETAMINOPHEN 5-325 MG PO TABS: 1.0000 | ORAL_TABLET | Freq: Three times a day (TID) | ORAL | 0 refills | Status: DC | PRN

## 2021-05-20 MED ORDER — NAC 600 MG PO CAPS: 1.0000 | ORAL_CAPSULE | Freq: Two times a day (BID) | ORAL | 0 refills | Status: AC

## 2021-05-20 NOTE — Progress Notes (Signed)
Subjective:    Patient ID: [name], female    DOB: 06/12/1941, 80 y.o.   MRN: [id number]  HPI  An audio/video tele-health visit is felt to be the most appropriate encounter for this patient at this time. This is a follow up tele-visit via phone. The patient is at home. MD is at office.    [name] presents today for f/u of severe trigeminal neuralgia.   1) Severe trigeminal neuralgia: -morphine was not effective and caused itching, sarna lotion did help wit this.  -hydrocodone has been most effective thus far -nucynta was not effective but she is not sure whether she gave it a fair trial -she has month appointments scheduled with Zella Ball and f/u with me in Feb -she used the Norco last night and this morning with good benefit and has started to feel the pain return this afternoon -she did find benefit from the compound cream- she continues to use this.  -She continues to have a sharp shooting pain doing around her eyelid and the tip teeth and in the top of her head.  -has a radiofrequency ablation treatment that helped but not completely -she is not having as much sharp knife-like pin like in her eye Her top teeth in her left law have been hurting really badly.  -she now takes the baclofen with the gabapentin and they seem to work well together.  -she had surgical procedure and feels worse -yesterday she took a half a tablet but this morning she took a whole one. She does fine with the hydrocodone but a whole one makes her too sleepy.  -she is taking 2 100mg  Gabapentin three times per day but it makes her dizzy and sleepy so she cannot funciton -the cold worsens her pain.  -she did get one week benefit from the steroid injection.  -her daughter accompanies her.   2) Fibromyalgia: -She has been almost nonfunctional due to the severity of her pain. It continues to be very severe.  -She would like to wean off the Gabapentin as she feels at risk of falls -she is taking Topamax  BID. She felt this was helpful but made her unsteady on her feet.   3) Osteoporosis: -husband asks whether she should get a Prolia shot.   4) Dizziness: -head was swimming -she felt like she was going to fall.   -She tried the Phenytoin for 2 days and did not feel benefit so stopped it.   -she feels the gabapentin aggravates this and she would love to wean off but knows it helps her.   -She feels very sleepy during the day.   -She has never tried Topirmate before.   Prior history:  Her pain continues to be in her left eye, forehead. She has not tried any topicals for pain. She uses capsaicin for her back and knees.   Since last visit nothing we have tried has helped with her pain. She has tried to decrease her Gabapentin dose but then her pain increased so she has resumed taking 4 Gabapentin per day.   Her husband asks whether he should contact her surgeon regarding whether nerve may have become unclamped.   Discussed prolotherapy, trigger point injections, Baclofen, Clonazepam, Phenytoin as other options we could try,=.  Pain continues to be excruciating and she states she is not sure how much longer she can live like this.   Prior history: She has having some nausea, lightheadedness and felt this may be related to her Gabapentin. She  did not take any yesterday and then felt more sever pain in the evening. She also gets electrical shooting across her jaw.   She does not feel that she has cluster headaches as the pain has been so constant. She feels that she is getting worse. The oxygen made her feel relaxed but it is not helping.   The Sarna lotion does help with the itching in her head.   She was willing to try the Amitriptyline but it makes her too sleepy in the morning.   Her teeth and left eye incredibly hurt.   She feels that the Gabapentin has been making her dizziness.   She asks about biofeedback.   The pain is 7/10 on average, similar to last time.   When the  pain is severe it can make her depressed.   Pain Inventory Average Pain 8 Pain Right Now 9 My pain is constant, sharp, burning, dull, stabbing, tingling, and aching  In the last 24 hours, has pain interfered with the following? General activity 10 Relation with others 9 Enjoyment of life 10 What TIME of day is your pain at its worst? morning  and night Sleep (in general) Good  Pain is worse with: bending and standing Pain improves with: heat/ice and medication Relief from Meds: 5  Family History  Problem Relation Age of Onset   Heart disease Mother    Heart disease Father    Stroke Father    Stroke Sister    Asthma Daughter    Colon cancer Neg Hx    Social History   Socioeconomic History   Marital status: Married    Spouse name: Not on file   Number of children: 2   Years of education: college   Highest education level: Not on file  Occupational History   Occupation: housewife    Employer: RETIRED  Tobacco Use   Smoking status: Never   Smokeless tobacco: Never  Vaping Use   Vaping Use: Never used  Substance and Sexual Activity   Alcohol use: No    Alcohol/week: 0.0 standard drinks   Drug use: No   Sexual activity: Not on file  Other Topics Concern   Not on file  Social History Narrative   1 cup of Jasmine tea daily    Social Determinants of Health   Financial Resource Strain: Not on file  Food Insecurity: Not on file  Transportation Needs: Not on file  Physical Activity: Not on file  Stress: Not on file  Social Connections: Not on file   Past Surgical History:  Procedure Laterality Date   CHOLECYSTECTOMY  03/2010   COLONOSCOPY  2009   Dr. Raford Pitcher    CYSTOSCOPY     Multiple Cystoscopies with stents or biopsy    ESOPHAGOGASTRODUODENOSCOPY  10/2013   EYE SURGERY     bilateral cataract removal   Gamma knife radiation  01/2018   for trigeminal neuralgia   REFRACTIVE SURGERY Bilateral 2022   RHIZOTOMY Left 04/28/2021   Procedure: Left  Percutaneous trigeminal balloon rhizotomy;  Surgeon: Judith Part, MD;  Location: [facility];  Service: Neurosurgery;  Laterality: Left;   TONSILLECTOMY     Past Surgical History:  Procedure Laterality Date   CHOLECYSTECTOMY  03/2010   COLONOSCOPY  2009   Dr. Raford Pitcher    CYSTOSCOPY     Multiple Cystoscopies with stents or biopsy    ESOPHAGOGASTRODUODENOSCOPY  10/2013   EYE SURGERY     bilateral cataract removal   Gamma knife radiation  01/2018   for trigeminal neuralgia   REFRACTIVE SURGERY Bilateral 2022   RHIZOTOMY Left 04/28/2021   Procedure: Left Percutaneous trigeminal balloon rhizotomy;  Surgeon: Judith Part, MD;  Location: [facility];  Service: Neurosurgery;  Laterality: Left;   TONSILLECTOMY     Past Medical History:  Diagnosis Date   Adenomatous colon polyp 03/24/2018   Allergy    Arthritis    Cataract    Chronic headaches    Depression    Fibromyalgia    Gallstones    GERD (gastroesophageal reflux disease)    Hyperlipemia    Hypertension    Interstitial cystitis    Lactose intolerance    LBBB (left bundle branch block)    Osteoarthritis    Osteopenia    Pneumonia    Thyroid disease    Tinnitus    Trigeminal neuralgia    There were no vitals taken for this visit.  Opioid Risk Score:   Fall Risk Score:  `1  Depression screen PHQ 2/9  Depression screen [facility] 2/9 05/06/2021 10/28/2020 06/26/2020 05/14/2020 02/14/2020 02/07/2020 06/30/2016  Decreased Interest 0 0 0 0 0 3 0  Down, Depressed, Hopeless - 0 0 0 0 2 0  PHQ - 2 Score 0 0 0 0 0 5 0  Altered sleeping 0 - - - - 0 -  Tired, decreased energy - - - - - 3 -  Change in appetite - - - - - 0 -  Feeling bad or failure about yourself  - - - - - 0 -  Trouble concentrating - - - - - 0 -  Moving slowly or fidgety/restless - - - - - 0 -  Suicidal thoughts - - - - - 0 -  PHQ-9 Score 0 - - - - 8 -  Difficult doing work/chores - - - - - Very difficult -    Review of Systems  Constitutional: Negative.    HENT: Negative.    Eyes:  Positive for visual disturbance.  Respiratory: Negative.    Cardiovascular: Negative.   Gastrointestinal: Negative.   Endocrine: Negative.   Genitourinary: Negative.   Musculoskeletal: Negative.        Pain in left side of face  Skin: Negative.   Allergic/Immunologic: Negative.   Neurological:  Positive for dizziness, tremors, light-headedness and numbness.       Tingling   Hematological: Negative.   Psychiatric/Behavioral: Negative.    All other systems reviewed and are negative.     Objective:   Physical Exam Not performed as patient was seen via phone.     Assessment & Plan:  [name] is an 80 year old woman who presents for f/u of left sided trigeminal neuralgia and dizziness.    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 She does have a history of migraines.  -reviewed patient's medical notes -Discussed wean off Gabapentin -d/c topamax -will avoid any future procedures -discussed benefits of infrared light.  -Norco prescribed 5mg  TID PRN -follow-up monthly with Zella Ball and with me in February, placed on waitlist for earlier appointment with me, advised phone visits/MyChart are a great way to communicate in the interim -discussed that she can take an additional one or two tylenol for breakthrough pain -recommended checking CMP for liver enzymes with next labs -prescribed NAC 600mg  BID to protect the liver while taking tylenol, discussed its health benefits for mitochondrial function -d/c Baclofen  -f/u with Dr. Joaquim Nam.  -  encouraged anti-inflammatory diet.  -she had sinusitis that was viral and was treated with antibiotics. This was in the early 58s. The symptoms resolved after 2 months. She uses to get sinus infections and saline rinse.  -She has had multiple root canals.  -Discontinue 100% oxygen via facemask as this was not helpful.  -Discussed that unfortunately  Sprint PNS is not indicated for craniofacial pain. -Amitriptyline and Lyrica did not help.  -Continue Cymbalta t60mg .  -Topiramate helped but caused dizziness.  -Provided with hand out with information regarding trigeminal neuralgia -Retry the Phenytoin.  -She would like to try biofeedback. -reviewed neurology note.  -will try trigger point injections next visit.  -Continue Ketamine 10%, Baclofen 2%, Cyclobenzaprine 2%, Ketoprofen 10%, Gabapentin 6%, Bupivacaine 1%, Amitryptiline 5% to apply to painful areas- using 3-4 times per day.  -Can consider Meloxicam if Phenytoin does not help.  -Discussed Qutenza as an option for neuropathic pain control. Discussed that this is a capsaicin patch, stronger than capsaicin cream. Discussed that it is currently approved for diabetic peripheral neuropathy and post-herpetic neuralgia, but that it has also shown benefit in treating other forms of neuropathy. Provided patient with link to site to learn more about the patch: [employer]. Discussed that the patch would be placed in office and benefits usually last 3 months. Discussed that unintended exposure to capsaicin can cause severe irritation of eyes, mucous membranes, respiratory tract, and skin, but that Qutenza is a local treatment and does not have the systemic side effects of other nerve medications. Discussed that there may be pain, itching, erythema, and decreased sensory function associated with the application of Qutenza. Side effects usually subside within 1 week. A cold pack of analgesic medications can help with these side effects. Blood pressure can also be increased due to pain associated with administration of the patch.  -she cut down on her gabapentin due to her dizziness    Turmeric to reduce inflammation--can be used in cooking or taken as a supplement.  Benefits of turmeric:  -Highly anti-inflammatory  -Increases antioxidants  -Improves memory, attention, brain  disease  -Lowers risk of heart disease  -May help prevent cancer  -Decreases pain  -Alleviates depression  -Delays aging and decreases risk of chronic disease  -Consume with black pepper to increase absorption    Turmeric Milk Recipe:  1 cup milk  1 tsp turmeric  1 tsp cinnamon  1 tsp grated ginger (optional)  Black pepper (boosts the anti-inflammatory properties of turmeric).  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  -she is a good metabolizer of amitriptyline and has tolerated nortrypilline in the past, but without much benefit.    3) CKD: encouraged 608 glasses of water per day. She states that she had AKI with Ibuprofen in the past. Discussed checking Cr level next visit if we are considering Meloxicam -Discussed that Nucynta, and other pain medications, may stay in her bloodstream longer given her CKD   4) Hypernatremia: encouraged hydration  5) General health and pain: provided with list of healthy foods that are both nutritious and fight pain.   6) Depressed: Amitriptyline did help with this but she could not tolerate the drowsiness. Encouraged focusing on the factors that she can control, such as anti-inflammatory diet.   7) Fibromyalgia: Continue 30mg  of steroids per day for fibromyalgia. She is on that intermittently. She started this again about a month ago. She has been on this  about a year.  8) Dizziness: -discussed that this could be from the combination of gabapentin and baclofen.  -discussed wean off gabapentin  9) Constipation:  -Provided list of following foods that help with constipation and highlighted a few: 1) prunes- contain high amounts of fiber.  2) apples- has a form of dietary fiber called pectin that accelerates stool movement and increases beneficial gut bacteria 3) pears- in addition to fiber, also high in fructose and sorbitol which have laxative effect 4) figs- contain an enzyme ficin which helps to speed colonic transit 5) kiwis- contain an enzyme actinidin that improves gut motility and reduces constipation 6) oranges- rich in pectin (like apples) 7) grapefruits- contain a flavanol naringenin which has a laxative effect 8) vegetables- rich in fiber and also great sources of folate, vitamin C, and K 9) artichoke- high in inulin, prebiotic great for the microbiome 10) chicory- increases stool frequency and softness (can be added to coffee) 11) rhubarb- laxative effect 12) sweet potato- high fiber 13) beans, peas, and lentils- contain both soluble and insoluble fiber 14) chia seeds- improves intestinal health and gut flora 15) flaxseeds- laxative effect 16) whole grain rye bread- high in fiber 17) oat bran- high in soluble and insoluble fiber 18) kefir- softens stools -recommended to try at least one of these foods every day.  -drink 6-8 glasses of water per day -walk regularly, especially after meals.       All questions answered. RTC in 1 month.   14 minutes spent in discussion of her current pain, response to Hydrocodone/Morphine/Nucynta, continuing hydrocodone and discussing dosing and frequency, NAC to protect liver, recommended checking CMP next labs to check ALT/AST

## 2021-05-21 ENCOUNTER — Ambulatory Visit (INDEPENDENT_AMBULATORY_CARE_PROVIDER_SITE_OTHER): Payer: Medicare Other | Admitting: Orthopedic Surgery

## 2021-05-21 ENCOUNTER — Encounter: Payer: Self-pay | Admitting: Orthopedic Surgery

## 2021-05-21 ENCOUNTER — Other Ambulatory Visit: Payer: Self-pay

## 2021-05-21 DIAGNOSIS — M170 Bilateral primary osteoarthritis of knee: Secondary | ICD-10-CM | POA: Diagnosis not present

## 2021-05-22 ENCOUNTER — Telehealth: Payer: Self-pay

## 2021-05-22 DIAGNOSIS — M170 Bilateral primary osteoarthritis of knee: Secondary | ICD-10-CM | POA: Diagnosis not present

## 2021-05-22 MED ORDER — LIDOCAINE HCL 1 % IJ SOLN
5.0000 mL | INTRAMUSCULAR | Status: AC | PRN
  Administered 2021-05-21: 5 mL

## 2021-05-22 MED ORDER — LIDOCAINE HCL 1 % IJ SOLN
5.0000 mL | INTRAMUSCULAR | Status: AC | PRN
  Administered 2021-05-22: 5 mL

## 2021-05-22 MED ORDER — HYLAN G-F 20 48 MG/6ML IX SOSY
48.0000 mg | PREFILLED_SYRINGE | INTRA_ARTICULAR | Status: AC | PRN
Start: 2021-05-22 — End: 2021-05-22
  Administered 2021-05-22: 48 mg via INTRA_ARTICULAR

## 2021-05-22 MED ORDER — HYLAN G-F 20 48 MG/6ML IX SOSY
48.0000 mg | PREFILLED_SYRINGE | INTRA_ARTICULAR | Status: AC | PRN
Start: 2021-05-21 — End: 2021-05-21
  Administered 2021-05-21: 48 mg via INTRA_ARTICULAR

## 2021-05-22 NOTE — Telephone Encounter (Signed)
[employer] approved Nucynta. Approval 04/13/21-11-22/23

## 2021-05-22 NOTE — Progress Notes (Signed)
Noted  

## 2021-05-22 NOTE — Progress Notes (Signed)
   Procedure Note  Patient: [name]             Date of Birth: 03/19/1941           MRN: [id number]             Visit Date: 05/21/2021  Procedures: Visit Diagnoses:  1. Primary osteoarthritis of both knees     Large Joint Inj: R knee on 05/21/2021 9:03 AM Indications: diagnostic evaluation, joint swelling and pain Details: 18 G 1.5 in needle, superolateral approach  Arthrogram: No  Medications: 5 mL lidocaine 1 %; 48 mg Hylan 48 MG/6ML Outcome: tolerated well, no immediate complications Procedure, treatment alternatives, risks and benefits explained, specific risks discussed. Consent was given by the patient. Immediately prior to procedure a time out was called to verify the correct patient, procedure, equipment, support staff and site/side marked as required. Patient was prepped and draped in the usual sterile fashion.    Large Joint Inj: L knee on 05/22/2021 9:04 AM Indications: pain, joint swelling and diagnostic evaluation Details: 18 G 1.5 in needle, superolateral approach  Arthrogram: No  Medications: 5 mL lidocaine 1 %; 48 mg Hylan 48 MG/6ML Outcome: tolerated well, no immediate complications Procedure, treatment alternatives, risks and benefits explained, specific risks discussed. Consent was given by the patient. Immediately prior to procedure a time out was called to verify the correct patient, procedure, equipment, support staff and site/side marked as required. Patient was prepped and draped in the usual sterile fashion.   This patient is diagnosed with osteoarthritis of the knee(s).    Radiographs show evidence of joint space narrowing, osteophytes, subchondral sclerosis and/or subchondral cysts.  This patient has knee pain which interferes with functional and activities of daily living.    This patient has experienced inadequate response, adverse effects and/or intolerance with conservative treatments such as acetaminophen, NSAIDS, topical creams, physical  therapy or regular exercise, knee bracing and/or weight loss.   This patient has experienced inadequate response or has a contraindication to intra articular steroid injections for at least 3 months.   This patient is not scheduled to have a total knee replacement within 6 months of starting treatment with viscosupplementation.

## 2021-05-26 DIAGNOSIS — E876 Hypokalemia: Secondary | ICD-10-CM | POA: Diagnosis not present

## 2021-05-26 DIAGNOSIS — I10 Essential (primary) hypertension: Secondary | ICD-10-CM | POA: Diagnosis not present

## 2021-06-01 ENCOUNTER — Encounter: Payer: Self-pay | Admitting: Physical Medicine and Rehabilitation

## 2021-06-02 ENCOUNTER — Encounter: Payer: Medicare Other | Attending: Physical Medicine and Rehabilitation | Admitting: Registered Nurse

## 2021-06-02 ENCOUNTER — Other Ambulatory Visit: Payer: Self-pay

## 2021-06-02 ENCOUNTER — Encounter: Payer: Self-pay | Admitting: Registered Nurse

## 2021-06-02 VITALS — BP 145/75 | HR 74 | Temp 97.6°F | Ht 63.0 in | Wt 151.0 lb

## 2021-06-02 DIAGNOSIS — G894 Chronic pain syndrome: Secondary | ICD-10-CM | POA: Insufficient documentation

## 2021-06-02 DIAGNOSIS — Z79891 Long term (current) use of opiate analgesic: Secondary | ICD-10-CM | POA: Insufficient documentation

## 2021-06-02 DIAGNOSIS — G500 Trigeminal neuralgia: Secondary | ICD-10-CM | POA: Insufficient documentation

## 2021-06-02 DIAGNOSIS — Z5181 Encounter for therapeutic drug level monitoring: Secondary | ICD-10-CM | POA: Diagnosis not present

## 2021-06-02 DIAGNOSIS — H353131 Nonexudative age-related macular degeneration, bilateral, early dry stage: Secondary | ICD-10-CM | POA: Diagnosis not present

## 2021-06-02 DIAGNOSIS — M5416 Radiculopathy, lumbar region: Secondary | ICD-10-CM | POA: Diagnosis not present

## 2021-06-02 DIAGNOSIS — M797 Fibromyalgia: Secondary | ICD-10-CM | POA: Diagnosis not present

## 2021-06-02 MED ORDER — HYDROCODONE-ACETAMINOPHEN 5-325 MG PO TABS: 1.0000 | ORAL_TABLET | Freq: Four times a day (QID) | ORAL | 0 refills | Status: DC | PRN

## 2021-06-02 NOTE — Progress Notes (Signed)
Subjective:    Patient ID: [name], female    DOB: March 05, 1941, 80 y.o.   MRN: [id number]  HPI: [name] is a 80 y.o. female who returns for follow up appointment for chronic pain and medication refill. She states her  pain is located in her lower back radiating into her right lower extremity and left facial nerve pain. She also reports her pain has increased in intensity. She rates her pain 8. Her current exercise regime is walking.  Ms. Ahmad Morphine equivalent is 15.00 MME. Last Oral Swab was Performed on 05/06/2021, it was consistent.        Pain Inventory Average Pain 8 Pain Right Now 8 My pain is intermittent, sharp, burning, dull, stabbing, and aching  In the last 24 hours, has pain interfered with the following? General activity 8 Relation with others 9 Enjoyment of life 10 What TIME of day is your pain at its worst? evening Sleep (in general) Good  Pain is worse with: bending Pain improves with: medication Relief from Meds: 2  Family History  Problem Relation Age of Onset   Heart disease Mother    Heart disease Father    Stroke Father    Stroke Sister    Asthma Daughter    Colon cancer Neg Hx    Social History   Socioeconomic History   Marital status: Married    Spouse name: Not on file   Number of children: 2   Years of education: college   Highest education level: Not on file  Occupational History   Occupation: housewife    Employer: RETIRED  Tobacco Use   Smoking status: Never   Smokeless tobacco: Never  Vaping Use   Vaping Use: Never used  Substance and Sexual Activity   Alcohol use: No    Alcohol/week: 0.0 standard drinks   Drug use: No   Sexual activity: Not on file  Other Topics Concern   Not on file  Social History Narrative   1 cup of Jasmine tea daily    Social Determinants of Health   Financial Resource Strain: Not on file  Food Insecurity: Not on file  Transportation Needs: Not on file  Physical Activity: Not  on file  Stress: Not on file  Social Connections: Not on file   Past Surgical History:  Procedure Laterality Date   CHOLECYSTECTOMY  03/2010   COLONOSCOPY  2009   Dr. Raford Pitcher    CYSTOSCOPY     Multiple Cystoscopies with stents or biopsy    ESOPHAGOGASTRODUODENOSCOPY  10/2013   EYE SURGERY     bilateral cataract removal   Gamma knife radiation  01/2018   for trigeminal neuralgia   REFRACTIVE SURGERY Bilateral 2022   RHIZOTOMY Left 04/28/2021   Procedure: Left Percutaneous trigeminal balloon rhizotomy;  Surgeon: Judith Part, MD;  Location: [facility];  Service: Neurosurgery;  Laterality: Left;   TONSILLECTOMY     Past Surgical History:  Procedure Laterality Date   CHOLECYSTECTOMY  03/2010   COLONOSCOPY  2009   Dr. Raford Pitcher    CYSTOSCOPY     Multiple Cystoscopies with stents or biopsy    ESOPHAGOGASTRODUODENOSCOPY  10/2013   EYE SURGERY     bilateral cataract removal   Gamma knife radiation  01/2018   for trigeminal neuralgia   REFRACTIVE SURGERY Bilateral 2022   RHIZOTOMY Left 04/28/2021   Procedure: Left Percutaneous trigeminal balloon rhizotomy;  Surgeon: Judith Part, MD;  Location: [facility];  Service: Neurosurgery;  Laterality: Left;   TONSILLECTOMY     Past Medical History:  Diagnosis Date   Adenomatous colon polyp 03/24/2018   Allergy    Arthritis    Cataract    Chronic headaches    Depression    Fibromyalgia    Gallstones    GERD (gastroesophageal reflux disease)    Hyperlipemia    Hypertension    Interstitial cystitis    Lactose intolerance    LBBB (left bundle branch block)    Osteoarthritis    Osteopenia    Pneumonia    Thyroid disease    Tinnitus    Trigeminal neuralgia    BP (!) 145/75   Pulse 74   Temp 97.6 F (36.4 C)   Ht 5\' 3"  (1.6 m)   Wt 151 lb (68.5 kg)   SpO2 97%   BMI 26.75 kg/m   Opioid Risk Score:   Fall Risk Score:  `1  Depression screen PHQ 2/9  Depression screen [facility] 2/9 05/06/2021 10/28/2020 06/26/2020  05/14/2020 02/14/2020 02/07/2020 06/30/2016  Decreased Interest 0 0 0 0 0 3 0  Down, Depressed, Hopeless - 0 0 0 0 2 0  PHQ - 2 Score 0 0 0 0 0 5 0  Altered sleeping 0 - - - - 0 -  Tired, decreased energy - - - - - 3 -  Change in appetite - - - - - 0 -  Feeling bad or failure about yourself  - - - - - 0 -  Trouble concentrating - - - - - 0 -  Moving slowly or fidgety/restless - - - - - 0 -  Suicidal thoughts - - - - - 0 -  PHQ-9 Score 0 - - - - 8 -  Difficult doing work/chores - - - - - Very difficult -    Review of Systems  Neurological:  Positive for headaches.       Left side of face      Objective:   Physical Exam Vitals and nursing note reviewed.  Constitutional:      Appearance: Normal appearance.  Cardiovascular:     Rate and Rhythm: Normal rate and regular rhythm.     Pulses: Normal pulses.     Heart sounds: Normal heart sounds.  Pulmonary:     Effort: Pulmonary effort is normal.     Breath sounds: Normal breath sounds.  Musculoskeletal:     Cervical back: Normal range of motion and neck supple.     Comments: Normal Muscle Bulk and Muscle Testing Reveals:  Upper Extremities: Full ROM and Muscle Strength 5/5 Lumbar Paraspinal Tenderness: L-4-L-5 Right Greater Trochanter Tenderness Lower Extremities: Full ROM and Muscle Strength 5/5 Arises from Table slowly Narrow Based Gait     Skin:    General: Skin is warm and dry.  Neurological:     Mental Status: She is alert and oriented to person, place, and time.  Psychiatric:        Mood and Affect: Mood normal.        Behavior: Behavior normal.         Assessment & Plan:  Right Lumbar Radiculitis: Continue Gabapentin. Continue HEP as Tolerated. Continue to Monitor.  Trigeminal Neuralgia: Continue Gabapentin. Continue to Monitor.  Fibromyalgia: Continue HEP as Tolerated. Continue Gabapentin. Continue to Monitor.  Chronic Pain Syndrome: Refilled: Increased Hydrocodone 5mg /325 mg one tablet 4 times a day as needed for  pain #120. We will continue the opioid monitoring program, this consists of regular clinic  visits, examinations, urine drug screen, pill counts as well as use of [place] Controlled Substance Reporting system. A 12 month History has been reviewed on the [facility] on 06/02/2021  F/U in 1 month

## 2021-06-03 ENCOUNTER — Ambulatory Visit (INDEPENDENT_AMBULATORY_CARE_PROVIDER_SITE_OTHER): Payer: Medicare Other | Admitting: Sports Medicine

## 2021-06-03 ENCOUNTER — Ambulatory Visit
Admission: RE | Admit: 2021-06-03 | Discharge: 2021-06-03 | Disposition: A | Discharge status: Home / Self Care | Payer: Medicare Other | Source: Ambulatory Visit | Attending: Sports Medicine | Admitting: Sports Medicine

## 2021-06-03 VITALS — BP 128/72 | Ht 63.0 in | Wt 151.0 lb

## 2021-06-03 DIAGNOSIS — M47816 Spondylosis without myelopathy or radiculopathy, lumbar region: Secondary | ICD-10-CM | POA: Diagnosis not present

## 2021-06-03 DIAGNOSIS — M5416 Radiculopathy, lumbar region: Secondary | ICD-10-CM | POA: Diagnosis not present

## 2021-06-03 DIAGNOSIS — M545 Low back pain, unspecified: Secondary | ICD-10-CM | POA: Diagnosis not present

## 2021-06-03 NOTE — Progress Notes (Addendum)
° °  Subjective:    Patient ID: [name], female    DOB: 1940-06-28, 80 y.o.   MRN: [id number]  HPI chief complaint low back pain  [name] returns to the office today to discuss a possible repeat ESI.  She has a documented history of lumbar degenerative disc disease and has done well with lumbar ESI's in the past.  Last lumbar ESI was done in August 2019.  It was a right-sided L5 ESI.  She has most recently began to notice returning pain along the right side of her low back with radiating pain into the proximal aspect of the right leg.  No radiating pain past the knee.  Her symptoms are similar to what she has experienced in the past with her lumbar radiculopathy.  She is also battling trigeminal neuralgia.  She has already had 4 different surgeries for this and is currently under the care of of neurosurgery and pain management.  Currently taking 200 mg of gabapentin 3 times a day, hydrocodone 4 times a day, and a low-dose of oral prednisone.  Despite this, she still experiences right-sided low back pain.  Capsaicin was initially helpful but has lost its effectiveness.  She is here today with her husband.  Interim medical history reviewed Medications reviewed Allergies reviewed    Review of Systems As above    Objective:   Physical Exam  Well-developed, well-nourished.  No acute distress  There is some tenderness to palpation just to the right of the lumbar midline at the L4-L5 level.  No spasm.  Negative straight leg raise.  She does have a slight amount of weakness with resisted great toe extension on the right but good strength throughout the rest of the right lower leg.  Reflexes are trace but equal at the Achilles and patellar tendons bilaterally.      Assessment & Plan:   Returning right-sided low back pain and radiculopathy secondary to lumbar degenerative disc disease Trigeminal neuralgia  We're going to start with getting updated imaging of her lumbar spine.  We will  start with x-rays and follow that up with an MRI in anticipation of ordering a repeat lumbar ESI.  [name] is also currently scheduled to undergo another procedure for her trigeminal neuralgia.  This procedure will involve dexamethasone so I have asked both her and her husband to check with her neurosurgeon to see if there is any sort of time limit on when she can receive her lumbar ESI either before or after her scheduled procedure.  Phone follow-up with [name] after the MRI.    This note was dictated using Dragon naturally speaking software and may contain errors in syntax, spelling, or content which have not been identified prior to signing this note.   Addendum: X-rays reviewed.  She has mild degenerative changes and facet arthropathy in the lower lumbar segments.  Nothing acute.  Proceed with MRI in anticipation of repeat ESI's.

## 2021-06-07 ENCOUNTER — Encounter: Payer: Self-pay | Admitting: Registered Nurse

## 2021-06-10 ENCOUNTER — Encounter: Payer: Self-pay | Admitting: Physical Medicine and Rehabilitation

## 2021-06-12 DIAGNOSIS — S61259A Open bite of unspecified finger without damage to nail, initial encounter: Secondary | ICD-10-CM | POA: Diagnosis not present

## 2021-06-12 DIAGNOSIS — W5501XA Bitten by cat, initial encounter: Secondary | ICD-10-CM | POA: Diagnosis not present

## 2021-06-16 ENCOUNTER — Other Ambulatory Visit: Payer: Self-pay | Admitting: Physical Medicine and Rehabilitation

## 2021-06-16 DIAGNOSIS — G500 Trigeminal neuralgia: Secondary | ICD-10-CM

## 2021-06-19 ENCOUNTER — Other Ambulatory Visit: Payer: Self-pay | Admitting: Physical Medicine and Rehabilitation

## 2021-06-19 MED ORDER — SAVELLA 12.5 MG PO TABS: 1.0000 | ORAL_TABLET | Freq: Every evening | ORAL | 3 refills | Status: DC

## 2021-06-20 ENCOUNTER — Other Ambulatory Visit: Payer: Self-pay | Admitting: Physical Medicine and Rehabilitation

## 2021-06-20 MED ORDER — SAVELLA 12.5 MG PO TABS: 1.0000 | ORAL_TABLET | Freq: Every evening | ORAL | 3 refills | Status: DC

## 2021-06-21 ENCOUNTER — Ambulatory Visit
Admission: RE | Admit: 2021-06-21 | Discharge: 2021-06-21 | Disposition: A | Discharge status: Home / Self Care | Payer: Medicare Other | Source: Ambulatory Visit | Attending: Sports Medicine | Admitting: Sports Medicine

## 2021-06-21 ENCOUNTER — Other Ambulatory Visit: Payer: Self-pay

## 2021-06-21 DIAGNOSIS — M4316 Spondylolisthesis, lumbar region: Secondary | ICD-10-CM | POA: Diagnosis not present

## 2021-06-21 DIAGNOSIS — M5416 Radiculopathy, lumbar region: Secondary | ICD-10-CM

## 2021-06-24 ENCOUNTER — Telehealth: Payer: Self-pay | Admitting: Sports Medicine

## 2021-06-24 NOTE — Telephone Encounter (Signed)
I spoke with Mardene Celeste after reviewing the updated MRI of her lumbar spine.  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  She continues to undergo treatment for trigeminal neuralgia which includes daily hydrocodone.  Her rheumatologist has also started her on 3 mg of prednisone daily.  As a result, her back pain has improved.  She has also being cautioned by her rheumatologist about future lumbar ESI's.  Apparently, Symiah has been told to avoid them due to recently diagnosed osteopenia.  Nonetheless, since her back pain and sciatica are currently tolerable, we will not pursue further treatment at this time.  She will follow-up with me as needed.

## 2021-06-25 NOTE — Progress Notes (Signed)
Subjective:    Patient ID: [name], female    DOB: June 08, 1941, 81 y.o.   MRN: [id number]  HPI   [name] presents today for follow-up of severe trigeminal neuralgia.   1) Severe trigeminal neuralgia: -morphine was not effective and caused itching, sarna lotion did help wit this.  -hydrocodone has been most effective thus far -nucynta was not effective but she is not sure whether she gave it a fair trial -she has month appointments scheduled with Zella Ball and f/u with me in Feb -she used the Norco last night and this morning with good benefit and has started to feel the pain return this afternoon -she did find benefit from the compound cream- she continues to use this.  -She continues to have a sharp shooting pain doing around her eyelid and the tip teeth and in the top of her head.  -has a radiofrequency ablation treatment that helped but not completely -she is not having as much sharp knife-like pin like in her eye Her top teeth in her left law have been hurting really badly.  -she now takes the baclofen with the gabapentin and they seem to work well together.  -she had surgical procedure and feels worse -yesterday she took a half a tablet but this morning she took a whole one. She does fine with the hydrocodone but a whole one makes her too sleepy.  -she is taking 2 100mg  Gabapentin three times per day but it makes her dizzy and sleepy so she cannot funciton -the cold worsens her pain.  -she did get one week benefit from the steroid injection.  -she is doing terribly -she is miserable -she feels depressed due to the pain -she has considered taking the whole bottle of hydrocodone because because she can't take this pain anymore.  -she started taking the Gabapentin 300mg  TID.  -she does find relief from the hydrocodone -the pain is constant -she wonders if it is inflammation.  -she has not tried turmeric. Her son-in-law gave her a bottle and she couldn't stomach it.    2) Fibromyalgia: -She has been almost nonfunctional due to the severity of her pain. It continues to be very severe.  -She would like to wean off the Gabapentin as she feels at risk of falls -she is taking Topamax BID. She felt this was helpful but made her unsteady on her feet.   3) Osteoporosis: -husband asks whether she should get a Prolia shot.   4) Dizziness: -head was swimming -she felt like she was going to fall.   -She tried the Phenytoin for 2 days and did not feel benefit so stopped it.   -she feels the gabapentin aggravates this and she would love to wean off but knows it helps her.   -She feels very sleepy during the day.   -She has never tried Topirmate before.   Prior history:  Her pain continues to be in her left eye, forehead. She has not tried any topicals for pain. She uses capsaicin for her back and knees.   Since last visit nothing we have tried has helped with her pain. She has tried to decrease her Gabapentin dose but then her pain increased so she has resumed taking 4 Gabapentin per day.   Her husband asks whether he should contact her surgeon regarding whether nerve may have become unclamped.   Discussed prolotherapy, trigger point injections, Baclofen, Clonazepam, Phenytoin as other options we could try,=.  Pain continues to be excruciating and she  states she is not sure how much longer she can live like this.   Prior history: She has having some nausea, lightheadedness and felt this may be related to her Gabapentin. She did not take any yesterday and then felt more sever pain in the evening. She also gets electrical shooting across her jaw.   She does not feel that she has cluster headaches as the pain has been so constant. She feels that she is getting worse. The oxygen made her feel relaxed but it is not helping.   The Sarna lotion does help with the itching in her head.   She was willing to try the Amitriptyline but it makes her too sleepy in the  morning.   Her teeth and left eye incredibly hurt.   She feels that the Gabapentin has been making her dizziness.   She asks about biofeedback.   The pain is 7/10 on average, similar to last time.   When the pain is severe it can make her depressed.   Pain Inventory Average Pain 10 Pain Right Now 10 My pain is constant, sharp, burning, dull, stabbing, tingling, and aching  In the last 24 hours, has pain interfered with the following? General activity 10 Relation with others 10 Enjoyment of life 10 What TIME of day is your pain at its worst? morning , daytime, evening, and night Sleep (in general) Good  Pain is worse with: bending and standing Pain improves with: heat/ice and medication Relief from Meds: 1  Family History  Problem Relation Age of Onset   Heart disease Mother    Heart disease Father    Stroke Father    Stroke Sister    Asthma Daughter    Colon cancer Neg Hx    Social History   Socioeconomic History   Marital status: Married    Spouse name: Not on file   Number of children: 2   Years of education: college   Highest education level: Not on file  Occupational History   Occupation: housewife    Employer: RETIRED  Tobacco Use   Smoking status: Never   Smokeless tobacco: Never  Vaping Use   Vaping Use: Never used  Substance and Sexual Activity   Alcohol use: No    Alcohol/week: 0.0 standard drinks   Drug use: No   Sexual activity: Not on file  Other Topics Concern   Not on file  Social History Narrative   1 cup of Jasmine tea daily    Social Determinants of Health   Financial Resource Strain: Not on file  Food Insecurity: Not on file  Transportation Needs: Not on file  Physical Activity: Not on file  Stress: Not on file  Social Connections: Not on file   Past Surgical History:  Procedure Laterality Date   CHOLECYSTECTOMY  03/2010   COLONOSCOPY  2009   Dr. Raford Pitcher    CYSTOSCOPY     Multiple Cystoscopies with stents or biopsy     ESOPHAGOGASTRODUODENOSCOPY  10/2013   EYE SURGERY     bilateral cataract removal   Gamma knife radiation  01/2018   for trigeminal neuralgia   REFRACTIVE SURGERY Bilateral 2022   RHIZOTOMY Left 04/28/2021   Procedure: Left Percutaneous trigeminal balloon rhizotomy;  Surgeon: Judith Part, MD;  Location: [place] [facility];  Service: Neurosurgery;  Laterality: Left;   TONSILLECTOMY     Past Surgical History:  Procedure Laterality Date   CHOLECYSTECTOMY  03/2010   COLONOSCOPY  2009   Dr. Raford Pitcher  CYSTOSCOPY     Multiple Cystoscopies with stents or biopsy    ESOPHAGOGASTRODUODENOSCOPY  10/2013   EYE SURGERY     bilateral cataract removal   Gamma knife radiation  01/2018   for trigeminal neuralgia   REFRACTIVE SURGERY Bilateral 2022   RHIZOTOMY Left 04/28/2021   Procedure: Left Percutaneous trigeminal balloon rhizotomy;  Surgeon: Judith Part, MD;  Location: [place];  Service: Neurosurgery;  Laterality: Left;   TONSILLECTOMY     Past Medical History:  Diagnosis Date   Adenomatous colon polyp 03/24/2018   Allergy    Arthritis    Cataract    Chronic headaches    Depression    Fibromyalgia    Gallstones    GERD (gastroesophageal reflux disease)    Hyperlipemia    Hypertension    Interstitial cystitis    Lactose intolerance    LBBB (left bundle branch block)    Osteoarthritis    Osteopenia    Pneumonia    Thyroid disease    Tinnitus    Trigeminal neuralgia    BP (!) 162/85    Pulse 73    Ht 5\' 3"  (1.6 m)    Wt 150 lb (68 kg)    SpO2 97%    BMI 26.57 kg/m   Opioid Risk Score:   Fall Risk Score:  `1  Depression screen PHQ 2/9  Depression screen [facility] 2/9 06/02/2021 05/06/2021 10/28/2020 06/26/2020 05/14/2020 02/14/2020 02/07/2020  Decreased Interest 1 0 0 0 0 0 3  Down, Depressed, Hopeless 1 - 0 0 0 0 2  PHQ - 2 Score 2 0 0 0 0 0 5  Altered sleeping - 0 - - - - 0  Tired, decreased energy - - - - - - 3  Change in appetite - - - - - - 0  Feeling bad or failure  about yourself  - - - - - - 0  Trouble concentrating - - - - - - 0  Moving slowly or fidgety/restless - - - - - - 0  Suicidal thoughts - - - - - - 0  PHQ-9 Score - 0 - - - - 8  Difficult doing work/chores - - - - - - Very difficult    Review of Systems  Constitutional: Negative.   HENT: Negative.    Eyes:  Positive for visual disturbance.  Respiratory: Negative.    Cardiovascular: Negative.   Gastrointestinal: Negative.   Endocrine: Negative.   Genitourinary: Negative.   Musculoskeletal: Negative.        Pain in left side of face  Skin: Negative.   Allergic/Immunologic: Negative.   Neurological:  Positive for dizziness, tremors, light-headedness and numbness.       Tingling   Hematological: Negative.   Psychiatric/Behavioral: Negative.    All other systems reviewed and are negative.     Objective:   Physical Exam Not performed as patient was seen via phone.     Assessment & Plan:  [name] is an 81 year old woman who presents for f/u of left sided trigeminal neuralgia and dizziness.    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 She does have a history of migraines.  -reviewed patient's medical notes -Discussed retrying the Gabapentin 300mg  TID.  -Discussed that turmeric  -discussed steroid taper. -continue savella, husband feels like it is helping with her suicidal thoughts. Increase to 25mg .  -continue hydrocodone.  -d/c topamax -she does have  another procedure scheduled. -discussed benefits of infrared light.  -Norco prescribed 5mg  TID PRN -follow-up monthly with Zella Ball and with me in February, placed on waitlist for earlier appointment with me, advised phone visits/MyChart are a great way to communicate in the interim -discussed that she can take an additional one or two tylenol for breakthrough pain -recommended checking CMP for liver enzymes with next labs -prescribed NAC 600mg  BID to  protect the liver while taking tylenol, discussed its health benefits for mitochondrial function -d/c Baclofen  -f/u with Dr. Joaquim Nam.  -encouraged anti-inflammatory diet.  -she had sinusitis that was viral and was treated with antibiotics. This was in the early 84s. The symptoms resolved after 2 months. She uses to get sinus infections and saline rinse.  -She has had multiple root canals.  -Discontinue 100% oxygen via facemask as this was not helpful.  -Discussed that unfortunately Sprint PNS is not indicated for craniofacial pain. -Amitriptyline and Lyrica did not help.  -Continue Cymbalta t60mg .  -Topiramate helped but caused dizziness.  -Provided with hand out with information regarding trigeminal neuralgia -Retry the Phenytoin.  -She would like to try biofeedback. -reviewed neurology note.  -will try trigger point injections next visit.  -Continue Ketamine 10%, Baclofen 2%, Cyclobenzaprine 2%, Ketoprofen 10%, Gabapentin 6%, Bupivacaine 1%, Amitryptiline 5% to apply to painful areas- using 3-4 times per day.  -Can consider Meloxicam if Phenytoin does not help.  -Discussed Qutenza as an option for neuropathic pain control. Discussed that this is a capsaicin patch, stronger than capsaicin cream. Discussed that it is currently approved for diabetic peripheral neuropathy and post-herpetic neuralgia, but that it has also shown benefit in treating other forms of neuropathy. Provided patient with link to site to learn more about the patch: [employer]. Discussed that the patch would be placed in office and benefits usually last 3 months. Discussed that unintended exposure to capsaicin can cause severe irritation of eyes, mucous membranes, respiratory tract, and skin, but that Qutenza is a local treatment and does not have the systemic side effects of other nerve medications. Discussed that there may be pain, itching, erythema, and decreased sensory function associated with the  application of Qutenza. Side effects usually subside within 1 week. A cold pack of analgesic medications can help with these side effects. Blood pressure can also be increased due to pain associated with administration of the patch.  -she cut down on her gabapentin due to her dizziness    Turmeric to reduce inflammation--can be used in cooking or taken as a supplement.  Benefits of turmeric:  -Highly anti-inflammatory  -Increases antioxidants  -Improves memory, attention, brain disease  -Lowers risk of heart disease  -May help prevent cancer  -Decreases pain  -Alleviates depression  -Delays aging and decreases risk of chronic disease  -Consume with black pepper to increase absorption    Turmeric Milk Recipe:  1 cup milk  1 tsp turmeric  1 tsp cinnamon  1 tsp grated ginger (optional)  Black pepper (boosts the anti-inflammatory properties of turmeric).  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  -she is a good metabolizer of amitriptyline and has tolerated nortrypilline in the past, but without much benefit.    3) CKD: encouraged 608 glasses of water per day. She states that she had AKI with Ibuprofen in the past. Discussed checking Cr level next visit if we are considering Meloxicam -Discussed that Nucynta, and other pain medications, may stay in her bloodstream longer given her CKD   4)  Hypernatremia: encouraged hydration   5) General health and pain: provided with list of healthy foods that are both nutritious and fight pain.   6) Depressed: Amitriptyline did help with this but she could not tolerate the drowsiness. Encouraged focusing on the factors that she can control, such as anti-inflammatory diet.   7) Fibromyalgia: Continue 30mg  of steroids per day for fibromyalgia. She is on that intermittently. She started this again about a month ago. She has been on this about a year.  8) Dizziness: -discussed that this could be from the combination of gabapentin and baclofen.  -discussed wean off gabapentin  9) Constipation:  -Provided list of following foods that help with constipation and highlighted a few: 1) prunes- contain high amounts of fiber.  2) apples- has a form of dietary fiber called pectin that accelerates stool movement and increases beneficial gut bacteria 3) pears- in addition to fiber, also high in fructose and sorbitol which have laxative effect 4) figs- contain an enzyme ficin which helps to speed colonic transit 5) kiwis- contain an enzyme actinidin that improves gut motility and reduces constipation 6) oranges- rich in pectin (like apples) 7) grapefruits- contain a flavanol naringenin which has a laxative effect 8) vegetables- rich in fiber and also great sources of folate, vitamin C, and K 9) artichoke- high in inulin, prebiotic great for the microbiome 10) chicory- increases stool frequency and softness (can be added to coffee) 11) rhubarb- laxative effect 12) sweet potato- high fiber 13) beans, peas, and lentils- contain both soluble and insoluble fiber 14) chia seeds- improves intestinal health and gut flora 15) flaxseeds- laxative effect 16) whole grain rye bread- high in fiber 17) oat bran- high in soluble and insoluble fiber 18) kefir- softens stools -recommended to try at least one of these foods every day.  -drink 6-8 glasses of water per  day -walk regularly, especially after meals.   10) Osteopenia - was tapered off the prednisone -discussed that currently her pain may be more of a risk to her than osteopenia given her suicidal ideation from her pain

## 2021-06-26 ENCOUNTER — Other Ambulatory Visit: Payer: Self-pay

## 2021-06-26 ENCOUNTER — Other Ambulatory Visit: Payer: Self-pay | Admitting: Physical Medicine and Rehabilitation

## 2021-06-26 ENCOUNTER — Encounter
Payer: Medicare Other | Attending: Physical Medicine and Rehabilitation | Admitting: Physical Medicine and Rehabilitation

## 2021-06-26 VITALS — BP 162/85 | HR 73 | Ht 63.0 in | Wt 150.0 lb

## 2021-06-26 DIAGNOSIS — G930 Cerebral cysts: Secondary | ICD-10-CM | POA: Insufficient documentation

## 2021-06-26 DIAGNOSIS — G500 Trigeminal neuralgia: Secondary | ICD-10-CM | POA: Diagnosis not present

## 2021-06-26 DIAGNOSIS — G9689 Other specified disorders of central nervous system: Secondary | ICD-10-CM | POA: Insufficient documentation

## 2021-06-26 DIAGNOSIS — R42 Dizziness and giddiness: Secondary | ICD-10-CM | POA: Diagnosis not present

## 2021-06-26 MED ORDER — CLONIDINE HCL 0.1 MG PO TABS: 0.1000 mg | ORAL_TABLET | Freq: Two times a day (BID) | ORAL | 11 refills | Status: DC

## 2021-06-26 MED ORDER — PREDNISONE 10 MG PO TABS: 10.0000 mg | ORAL_TABLET | Freq: Every day | ORAL | 0 refills | Status: DC

## 2021-06-26 MED ORDER — CLONIDINE 0.1 MG/24HR TD PTWK: 0.1000 mg | MEDICATED_PATCH | TRANSDERMAL | 12 refills | Status: DC

## 2021-06-26 MED ORDER — SAVELLA 25 MG PO TABS: 1.0000 | ORAL_TABLET | Freq: Every evening | ORAL | 3 refills | Status: DC

## 2021-06-30 ENCOUNTER — Other Ambulatory Visit: Payer: Self-pay | Admitting: Physical Medicine and Rehabilitation

## 2021-06-30 DIAGNOSIS — S0083XA Contusion of other part of head, initial encounter: Secondary | ICD-10-CM

## 2021-06-30 DIAGNOSIS — H02402 Unspecified ptosis of left eyelid: Secondary | ICD-10-CM

## 2021-06-30 DIAGNOSIS — G500 Trigeminal neuralgia: Secondary | ICD-10-CM

## 2021-07-01 ENCOUNTER — Encounter: Payer: Medicare Other | Admitting: Registered Nurse

## 2021-07-02 ENCOUNTER — Emergency Department (HOSPITAL_COMMUNITY)
Admission: EM | Admit: 2021-07-02 | Discharge: 2021-07-02 | Disposition: A | Discharge status: Home / Self Care | Payer: Medicare Other | Attending: Emergency Medicine | Admitting: Emergency Medicine

## 2021-07-02 ENCOUNTER — Ambulatory Visit
Admission: RE | Admit: 2021-07-02 | Discharge: 2021-07-02 | Disposition: A | Discharge status: Home / Self Care | Payer: Medicare Other | Source: Ambulatory Visit | Attending: Physical Medicine and Rehabilitation | Admitting: Physical Medicine and Rehabilitation

## 2021-07-02 ENCOUNTER — Encounter (HOSPITAL_BASED_OUTPATIENT_CLINIC_OR_DEPARTMENT_OTHER): Payer: Medicare Other | Admitting: Physical Medicine and Rehabilitation

## 2021-07-02 ENCOUNTER — Other Ambulatory Visit: Payer: Self-pay

## 2021-07-02 DIAGNOSIS — G8929 Other chronic pain: Secondary | ICD-10-CM | POA: Diagnosis not present

## 2021-07-02 DIAGNOSIS — S0083XA Contusion of other part of head, initial encounter: Secondary | ICD-10-CM | POA: Diagnosis not present

## 2021-07-02 DIAGNOSIS — G501 Atypical facial pain: Secondary | ICD-10-CM | POA: Diagnosis not present

## 2021-07-02 DIAGNOSIS — R519 Headache, unspecified: Secondary | ICD-10-CM | POA: Insufficient documentation

## 2021-07-02 DIAGNOSIS — Z20822 Contact with and (suspected) exposure to covid-19: Secondary | ICD-10-CM | POA: Diagnosis not present

## 2021-07-02 DIAGNOSIS — G500 Trigeminal neuralgia: Secondary | ICD-10-CM | POA: Insufficient documentation

## 2021-07-02 DIAGNOSIS — G9389 Other specified disorders of brain: Secondary | ICD-10-CM | POA: Insufficient documentation

## 2021-07-02 DIAGNOSIS — G930 Cerebral cysts: Secondary | ICD-10-CM | POA: Diagnosis not present

## 2021-07-02 DIAGNOSIS — Z79899 Other long term (current) drug therapy: Secondary | ICD-10-CM | POA: Insufficient documentation

## 2021-07-02 DIAGNOSIS — Z7951 Long term (current) use of inhaled steroids: Secondary | ICD-10-CM | POA: Insufficient documentation

## 2021-07-02 DIAGNOSIS — H02402 Unspecified ptosis of left eyelid: Secondary | ICD-10-CM

## 2021-07-02 DIAGNOSIS — R52 Pain, unspecified: Secondary | ICD-10-CM

## 2021-07-02 LAB — CBC WITH DIFFERENTIAL/PLATELET
Abs Immature Granulocytes: 0.08 10*3/uL — ABNORMAL HIGH (ref 0.00–0.07)
Basophils Absolute: 0 10*3/uL (ref 0.0–0.1)
Basophils Relative: 0 %
Eosinophils Absolute: 0 10*3/uL (ref 0.0–0.5)
Eosinophils Relative: 0 %
HCT: 41.4 % (ref 36.0–46.0)
Hemoglobin: 13.2 g/dL (ref 12.0–15.0)
Immature Granulocytes: 1 %
Lymphocytes Relative: 7 %
Lymphs Abs: 0.7 10*3/uL (ref 0.7–4.0)
MCH: 29.7 pg (ref 26.0–34.0)
MCHC: 31.9 g/dL (ref 30.0–36.0)
MCV: 93 fL (ref 80.0–100.0)
Monocytes Absolute: 0.3 10*3/uL (ref 0.1–1.0)
Monocytes Relative: 3 %
Neutro Abs: 8.7 10*3/uL — ABNORMAL HIGH (ref 1.7–7.7)
Neutrophils Relative %: 89 %
Platelets: 240 10*3/uL (ref 150–400)
RBC: 4.45 MIL/uL (ref 3.87–5.11)
RDW: 15.9 % — ABNORMAL HIGH (ref 11.5–15.5)
WBC: 9.8 10*3/uL (ref 4.0–10.5)
nRBC: 0 % (ref 0.0–0.2)

## 2021-07-02 LAB — BASIC METABOLIC PANEL
Anion gap: 9 (ref 5–15)
BUN: 21 mg/dL (ref 8–23)
CO2: 26 mmol/L (ref 22–32)
Calcium: 9.4 mg/dL (ref 8.9–10.3)
Chloride: 107 mmol/L (ref 98–111)
Creatinine, Ser: 1.32 mg/dL — ABNORMAL HIGH (ref 0.44–1.00)
GFR, Estimated: 41 mL/min — ABNORMAL LOW (ref >60)
Glucose, Bld: 170 mg/dL — ABNORMAL HIGH (ref 70–99)
Potassium: 3.7 mmol/L (ref 3.5–5.1)
Sodium: 142 mmol/L (ref 135–145)

## 2021-07-02 LAB — APTT: aPTT: 23 seconds — ABNORMAL LOW (ref 24–36)

## 2021-07-02 LAB — RESP PANEL BY RT-PCR (FLU A&B, COVID) ARPGX2
Influenza A by PCR: NEGATIVE
Influenza B by PCR: NEGATIVE
SARS Coronavirus 2 by RT PCR: NEGATIVE

## 2021-07-02 LAB — PROTIME-INR
INR: 0.9 (ref 0.8–1.2)
Prothrombin Time: 12.6 seconds (ref 11.4–15.2)

## 2021-07-02 MED ORDER — SODIUM CHLORIDE 0.9 % IV BOLUS
500.0000 mL | Freq: Once | INTRAVENOUS | Status: AC
  Administered 2021-07-02: 500 mL via INTRAVENOUS

## 2021-07-02 MED ORDER — FENTANYL CITRATE PF 50 MCG/ML IJ SOSY
50.0000 ug | PREFILLED_SYRINGE | Freq: Once | INTRAMUSCULAR | Status: AC
  Administered 2021-07-02: 50 ug via INTRAVENOUS
  Filled 2021-07-02: qty 1

## 2021-07-02 MED ORDER — FENTANYL 12 MCG/HR TD PT72
1.0000 | MEDICATED_PATCH | TRANSDERMAL | Status: DC
  Administered 2021-07-02: 1 via TRANSDERMAL
  Filled 2021-07-02: qty 1

## 2021-07-02 MED ORDER — ONDANSETRON HCL 4 MG/2ML IJ SOLN
4.0000 mg | Freq: Once | INTRAMUSCULAR | Status: AC
  Administered 2021-07-02: 4 mg via INTRAVENOUS
  Filled 2021-07-02: qty 2

## 2021-07-02 MED ORDER — FENTANYL 25 MCG/HR TD PT72: 1.0000 | MEDICATED_PATCH | TRANSDERMAL | Status: DC

## 2021-07-02 MED ORDER — FENTANYL 12 MCG/HR TD PT72: 1.0000 | MEDICATED_PATCH | TRANSDERMAL | 0 refills | Status: DC

## 2021-07-02 NOTE — ED Triage Notes (Signed)
Pt sent here by pain management for neurology admission and pain management. Pt with hx of trigeminal neuralgia with increasing stabbing stabbing pain in head, had MRI this morning and new cyst was found so she was sent to ED.

## 2021-07-02 NOTE — ED Notes (Signed)
Graham crackers, PB , and a coke provided per EDP approval

## 2021-07-02 NOTE — ED Provider Triage Note (Signed)
Emergency Medicine Provider Triage Evaluation Note  [name] , Fuller 81 y.o. female  was evaluated in triage.  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  Patient has Fuller history of trigeminal neuralgia.  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  Patient is being treated by her pain management doctor for trigeminal neuralgia localized to the left.  Patient was informed to come into the ED. Denies fever, chills nausea, vomiting, vision changes.  Review of Systems  Positive: As per HPI above Negative: Fever, chills, vision changes  Physical Exam  BP 138/65 (BP Location: Right Arm)    Pulse 76    Temp 98.3 F (36.8 C) (Oral)    Resp 18    SpO2 99%  Gen:   Awake, no distress   Resp:  Normal effort  MSK:   Moves extremities without difficulty  Other:  No tenderness to palpation to frontal/maxillary sinuses.  PERRL.  EOMI.`  Medical Decision Making  Medically screening exam initiated at 3:58 PM.  Appropriate orders placed.  [name] was informed that the remainder of the evaluation will be completed by another provider, this initial triage assessment does not replace that evaluation, and the importance of remaining in the ED until their evaluation is complete.  3:59 PM - Discussed with RN that patient is in need of Fuller room immediately. RN aware and working on room placement.    Kristina Giovanetti A, PA-C 07/02/21 1609

## 2021-07-02 NOTE — ED Provider Notes (Signed)
[facility] EMERGENCY DEPARTMENT Provider Note   CSN: [id number] Arrival date & time: 07/02/21  1527     History  No chief complaint on file.   [name] is a 81 y.o. female.  HPI Patient presents with pain in her left face, scalp.  Pain is stabbing, sharp, has been present for about 5 years, is characteristically not change, but severity is more severe, possibly over the past 2 weeks.  Her history of fibromyalgia, trigeminal neuralgia is noted.  Patient also history of prior rhizotomy within the past few months.  History is obtained by the patient, her husband and chart review with details of the rhizotomy. 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  With consideration of this, worsening pain she was sent here for evaluation.    Home Medications Prior to Admission medications   Medication Sig Start Date End Date Taking? Authorizing Provider  calcium carbonate (OS-CAL) 600 MG TABS tablet Take 600 mg by mouth daily with breakfast.   Yes [provider]  cetirizine (ZYRTEC) 10 MG tablet Take 10 mg by mouth daily.   Yes [provider]  Cholecalciferol (VITAMIN D-3) 25 MCG (1000 UT) CAPS Take 2,000 Units by mouth daily.   Yes [provider]  cloNIDine (CATAPRES - DOSED IN MG/24 HR) 0.1 mg/24hr patch Place 1 patch (0.1 mg total) onto the skin once a week. 06/26/21  Yes Raulkar, Clide Deutscher, MD  Cyanocobalamin (VITAMIN B-12) 2500 MCG SUBL Place 2,500 mcg under the tongue daily.   Yes [provider]  denosumab (PROLIA) 60 MG/ML SOSY injection Inject 60 mg into the skin every 6 (six) months.   Yes [provider]  DULoxetine (CYMBALTA) 30 MG capsule TAKE 1 CAPSULE TWICE A DAY Patient taking differently: 60 mg daily. 05/13/21  Yes Raulkar, Clide Deutscher, MD  fluticasone (FLONASE) 50 MCG/ACT nasal spray Place 1 spray into  both nostrils daily.   Yes [provider]  gabapentin (NEURONTIN) 100 MG capsule Take 200 mg by mouth 3 (three) times daily.   Yes [provider]  HYDROcodone-acetaminophen (NORCO/VICODIN) 5-325 MG tablet Take 1 tablet by mouth 4 (four) times daily as needed for moderate pain. Sig was changed on 06/02/2021 to increase Hydrocodone to 4 times a day as needed for pain. Please Fill On 06/11/2021. Patient taking differently: Take 1 tablet by mouth 3 (three) times daily. 06/02/21  Yes Bayard Hugger, NP  levothyroxine (SYNTHROID) 75 MCG tablet Take 75 mcg by mouth daily before breakfast.   Yes [provider]  Milnacipran HCl (SAVELLA) 25 MG TABS Take 1 tablet (25 mg total) by mouth at bedtime. 06/26/21  Yes Raulkar, Clide Deutscher, MD  ondansetron (ZOFRAN) 4 MG tablet Take 4 mg by mouth 2 (two) times daily as needed for nausea/vomiting. 02/06/21  Yes [provider]  pantoprazole (PROTONIX) 40 MG tablet Take 40 mg by mouth daily.   Yes [provider]  predniSONE (DELTASONE) 10 MG tablet Take 1 tablet (10 mg total) by mouth daily with breakfast. 06/26/21  Yes Raulkar, Clide Deutscher, MD  PREMARIN vaginal cream Place 1 applicator vaginally 3 (three) times a week. 11/03/18  Yes [provider]  Pyridoxine HCl (VITAMIN B-6 PO) Take 1 tablet by mouth daily.   Yes [provider]  Acetylcysteine (NAC) 600 MG CAPS Take 1 capsule (600 mg total) by mouth 2 (two) times daily. Patient  not taking: Reported on 07/02/2021 05/20/21   Izora Ribas, MD  Capsaicin 0.1 % CREA Apply 1 application topically daily as needed (Back pain).    [provider]  cloNIDine (CATAPRES) 0.1 MG tablet Take 1 tablet (0.1 mg total) by mouth 2 (two) times daily. Patient not taking: Reported on 07/02/2021 06/26/21   Izora Ribas, MD  Fluticasone Propionate,sensor, [facility]) 55 MCG/ACT AEPB Inhale into the lungs.    [provider]  gabapentin  (NEURONTIN) 300 MG capsule Take 1 capsule (300 mg total) by mouth every 4 (four) hours as needed. Patient not taking: Reported on 07/02/2021 07/02/20   Izora Ribas, MD  NONFORMULARY OR COMPOUNDED ITEM Apply 1 Pump topically 4 (four) times daily as needed. Ketamine 10%, Baclofen 2%, Cyclobenzaprine 2%, Ketoprofen 10%, Gabapentin 6%, Bupivacaine 1%, Amitryptiline 5% Dispense 100mg  Patient not taking: Reported on 07/02/2021 04/15/20   Raulkar, Clide Deutscher, MD  olmesartan (BENICAR) 40 MG tablet Take 40 mg by mouth daily. Patient not taking: Reported on 07/02/2021 08/20/19   [provider]      Allergies    Dipyridamole, Diclofenac sodium, Hydrochlorothiazide, Levothyroxine sodium, Naproxen, Other, Oxycodone hcl, Pantoprazole sodium, Triamterene, Buprenorphine, Codeine, Doxycycline, Esomeprazole, Irbesartan, Lactose intolerance (gi), Lactulose, Losartan, Maxzide [hydrochlorothiazide w-triamterene], Nortriptyline hcl, Oxycodone, Oxycontin [oxycodone hcl], Pennsaid [diclofenac sodium], Telmisartan, and Tramadol    Review of Systems   Review of Systems  Constitutional:        Per HPI, otherwise negative  HENT:         Per HPI, otherwise negative  Respiratory:         Per HPI, otherwise negative  Cardiovascular:        Per HPI, otherwise negative  Gastrointestinal:  Negative for vomiting.  Endocrine:       Negative aside from HPI  Genitourinary:        Neg aside from HPI   Musculoskeletal:        Per HPI, otherwise negative  Skin: Negative.   Neurological:  Negative for syncope.   Physical Exam Updated Vital Signs BP 139/75    Pulse 66    Temp 98.3 F (36.8 C) (Oral)    Resp 12    SpO2 100%  Physical Exam Vitals and nursing note reviewed.  Constitutional:      General: She is not in acute distress.    Appearance: She is well-developed.  HENT:     Head: Normocephalic and atraumatic.  Eyes:     Conjunctiva/sclera: Conjunctivae normal.  Cardiovascular:     Rate and Rhythm:  Normal rate and regular rhythm.  Pulmonary:     Effort: Pulmonary effort is normal. No respiratory distress.     Breath sounds: Normal breath sounds. No stridor.  Abdominal:     General: There is no distension.  Skin:    General: Skin is warm and dry.  Neurological:     Mental Status: She is alert and oriented to person, place, and time.     Cranial Nerves: No cranial nerve deficit.     Comments: Face is symmetric, speech is clear, strength in her extremities is essentially unremarkable aside from 4+/5 strength in the left upper extremity proximally.    ED Results / Procedures / Treatments   Labs (all labs ordered are listed, but only abnormal results are displayed) Labs Reviewed  BASIC METABOLIC PANEL - Abnormal; Notable for the following components:      Result Value   Glucose, Bld 170 (*)  Creatinine, Ser 1.32 (*)    GFR, Estimated 41 (*)    All other components within normal limits  CBC WITH DIFFERENTIAL/PLATELET - Abnormal; Notable for the following components:   RDW 15.9 (*)    Neutro Abs 8.7 (*)    Abs Immature Granulocytes 0.08 (*)    All other components within normal limits  APTT - Abnormal; Notable for the following components:   aPTT 23 (*)    All other components within normal limits  RESP PANEL BY RT-PCR (FLU A&B, COVID) [id number]  PROTIME-INR    EKG None  Radiology MR BRAIN WO CONTRAST  Addendum Date: 07/02/2021   ADDENDUM REPORT: 07/02/2021 15:14 ADDENDUM: 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 Electronically Signed   By: Logan Bores M.D.   On: 07/02/2021 15:14   Result Date: 07/02/2021 CLINICAL DATA:  Trigeminal neuralgia. Acquired ptosis of left eyelid. Facial contusion. Ocular pain. History of gamma knife radiation, microvascular decompression, and percutaneous  trigeminal balloon rhizotomy for trigeminal neuralgia. EXAM: MRI HEAD WITHOUT CONTRAST TECHNIQUE: Multiplanar, multiecho pulse sequences of the brain and surrounding structures were obtained without intravenous contrast. COMPARISON:  Brain/trigeminal MRI 12/04/2017.  Head CT 11/09/2019. FINDINGS: Brain: There is no evidence of an acute infarct, intracranial hemorrhage, mass, midline shift, or extra-axial fluid collection. There is a new 2.7 cm cystic focus posteriorly in the left cerebellar hemisphere with mild surrounding encephalomalacia and gliosis which may be related to interval surgery. Scattered small T2 hyperintensities in the cerebral white matter bilaterally are unchanged from the prior MRI and are nonspecific but compatible with mild chronic small vessel ischemic disease. There is mild cerebral atrophy. 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 Vascular: Major intracranial vascular flow voids are preserved. Skull and upper cervical spine: Interval left retrosigmoid craniotomy. Sinuses/Orbits: Bilateral cataract extraction. Trace left mastoid fluid. Clear paranasal sinuses. Other: None. IMPRESSION: 1. No acute intracranial abnormality. 2. 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 3. Mild chronic small vessel ischemic disease. Electronically Signed: By: Logan Bores M.D. On: 07/02/2021 13:13    Procedures Procedures    Medications Ordered in ED Medications  sodium chloride 0.9 % bolus 500 mL (has no administration in time range)  ondansetron (ZOFRAN) injection 4 mg (has no administration in time range)  fentaNYL (SUBLIMAZE) injection 50 mcg (has no administration in time range)    ED Course/ Medical Decision Making/ A&P  11:00 PM Patient has had 2 doses of fentanyl, each with improvement in her condition.  She and I had a lengthy conversation about her MRI results from earlier in the day, I discussed it with her  husband several times as well.  I discussed her case with our neurology and neurosurgery colleagues, discussed the MRI results which I have independently interpreted, and agree with the interpretation of new cystic lesion posterior fossa, the patient's history, chronic pain, now with worsening and abnormal MRI performed earlier in the day.  Each of her consultants note no anatomic pathway likely with cerebellar cystic lesion and pain that preceded the development and is inconsistent with disruption from it.  No evidence for new other neurologic dysfunction/stroke/encephalopathy/meningitis.  No evidence for concurrent bacteremia or sepsis.  With improvement here patient will try a patch of fentanyl which I  discussed with our pharmacy team pending outpatient neurology and neurosurgery follow-up.          Final Clinical Impression(s) / ED Diagnoses Final diagnoses:  Pain    Rx / DC Orders ED Discharge Orders          Ordered    fentaNYL ([place]) 12 MCG/HR  every 72 hours        07/02/21 2305              Carmin Muskrat, MD 07/02/21 2306

## 2021-07-02 NOTE — Discharge Instructions (Signed)
As discussed, your evaluation today has been largely reassuring.  But, it is important that you monitor your condition carefully, and do not hesitate to return to the ED if you develop new, or concerning changes in your condition.  You have been prescribed a new pain medication.  Please discuss this with your team of physicians for appropriate ongoing outpatient management.

## 2021-07-02 NOTE — Progress Notes (Signed)
Subjective:    Patient ID: [name], female    DOB: 06/06/1941, 81 y.o.   MRN: [id number]  HPI  An audio/video tele-health visit is felt to be the most appropriate encounter for this patient at this time. This is a follow up tele-visit via phone. The patient is at home. MD is at office. Prior to scheduling this appointment, our staff discussed the limitations of evaluation and management by telemedicine and the availability of in-person appointments. The patient expressed understanding and agreed to proceed.   [name] presents today for follow-up of severe trigeminal neuralgia.   1) Severe trigeminal neuralgia: Given darkening of skin and worsening eye lid closure, MRI brain stat obtained and shows new cyst that could be post-surgical -pain is worsening and nothing is providing relief -she asks whether she should go to ED -she has also noted dizziness and is not sure if this is from being in the MRI or from the [place].  -morphine was not effective and caused itching, sarna lotion did help wit this.  -hydrocodone has been most effective thus far -nucynta was not effective but she is not sure whether she gave it a fair trial -she has month appointments scheduled with Zella Ball and f/u with me in Feb -she used the Norco last night and this morning with good benefit and has started to feel the pain return this afternoon -she did find benefit from the compound cream- she continues to use this.  -She continues to have a sharp shooting pain doing around her eyelid and the tip teeth and in the top of her head.  -has a radiofrequency ablation treatment that helped but not completely -she is not having as much sharp knife-like pin like in her eye Her top teeth in her left law have been hurting really badly.  -she now takes the baclofen with the gabapentin and they seem to work well together.  -she had surgical procedure and feels worse -yesterday she took a half a tablet but this morning  she took a whole one. She does fine with the hydrocodone but a whole one makes her too sleepy.  -she is taking 2 100mg  Gabapentin three times per day but it makes her dizzy and sleepy so she cannot funciton -the cold worsens her pain.  -she did get one week benefit from the steroid injection.  -she is doing terribly -she is miserable -she feels depressed due to the pain -she has considered taking the whole bottle of hydrocodone because because she can't take this pain anymore.  -she started taking the Gabapentin 300mg  TID.  -she does find relief from the hydrocodone -the pain is constant -she wonders if it is inflammation.  -she has not tried turmeric. Her son-in-law gave her a bottle and she couldn't stomach it.   2) Fibromyalgia: -She has been almost nonfunctional due to the severity of her pain. It continues to be very severe.  -She would like to wean off the Gabapentin as she feels at risk of falls -she is taking Topamax BID. She felt this was helpful but made her unsteady on her feet.   3) Osteoporosis: -husband asks whether she should get a Prolia shot.   4) Dizziness: -head was swimming -she felt like she was going to fall.   -She tried the Phenytoin for 2 days and did not feel benefit so stopped it.   -she feels the gabapentin aggravates this and she would love to wean off but knows it helps her.   -  She feels very sleepy during the day.   -She has never tried Topirmate before.   Prior history:  Her pain continues to be in her left eye, forehead. She has not tried any topicals for pain. She uses capsaicin for her back and knees.   Since last visit nothing we have tried has helped with her pain. She has tried to decrease her Gabapentin dose but then her pain increased so she has resumed taking 4 Gabapentin per day.   Her husband asks whether he should contact her surgeon regarding whether nerve may have become unclamped.   Discussed prolotherapy, trigger point  injections, Baclofen, Clonazepam, Phenytoin as other options we could try,=.  Pain continues to be excruciating and she states she is not sure how much longer she can live like this.   Prior history: She has having some nausea, lightheadedness and felt this may be related to her Gabapentin. She did not take any yesterday and then felt more sever pain in the evening. She also gets electrical shooting across her jaw.   She does not feel that she has cluster headaches as the pain has been so constant. She feels that she is getting worse. The oxygen made her feel relaxed but it is not helping.   The Sarna lotion does help with the itching in her head.   She was willing to try the Amitriptyline but it makes her too sleepy in the morning.   Her teeth and left eye incredibly hurt.   She feels that the Gabapentin has been making her dizziness.   She asks about biofeedback.   The pain is 7/10 on average, similar to last time.   When the pain is severe it can make her depressed.   Pain Inventory Average Pain 10 Pain Right Now 10 My pain is constant, sharp, burning, dull, stabbing, tingling, and aching  In the last 24 hours, has pain interfered with the following? General activity 10 Relation with others 10 Enjoyment of life 10 What TIME of day is your pain at its worst? morning , daytime, evening, and night Sleep (in general) Good  Pain is worse with: bending and standing Pain improves with: heat/ice and medication Relief from Meds: 1  Family History  Problem Relation Age of Onset   Heart disease Mother    Heart disease Father    Stroke Father    Stroke Sister    Asthma Daughter    Colon cancer Neg Hx    Social History   Socioeconomic History   Marital status: Married    Spouse name: Not on file   Number of children: 2   Years of education: college   Highest education level: Not on file  Occupational History   Occupation: housewife    Employer: RETIRED  Tobacco Use    Smoking status: Never   Smokeless tobacco: Never  Vaping Use   Vaping Use: Never used  Substance and Sexual Activity   Alcohol use: No    Alcohol/week: 0.0 standard drinks   Drug use: No   Sexual activity: Not on file  Other Topics Concern   Not on file  Social History Narrative   1 cup of Jasmine tea daily    Social Determinants of Health   Financial Resource Strain: Not on file  Food Insecurity: Not on file  Transportation Needs: Not on file  Physical Activity: Not on file  Stress: Not on file  Social Connections: Not on file   Past Surgical History:  Procedure Laterality Date   CHOLECYSTECTOMY  03/2010   COLONOSCOPY  2009   Dr. Raford Pitcher    CYSTOSCOPY     Multiple Cystoscopies with stents or biopsy    ESOPHAGOGASTRODUODENOSCOPY  10/2013   EYE SURGERY     bilateral cataract removal   Gamma knife radiation  01/2018   for trigeminal neuralgia   REFRACTIVE SURGERY Bilateral 2022   RHIZOTOMY Left 04/28/2021   Procedure: Left Percutaneous trigeminal balloon rhizotomy;  Surgeon: Judith Part, MD;  Location: [facility];  Service: Neurosurgery;  Laterality: Left;   TONSILLECTOMY     Past Surgical History:  Procedure Laterality Date   CHOLECYSTECTOMY  03/2010   COLONOSCOPY  2009   Dr. Raford Pitcher    CYSTOSCOPY     Multiple Cystoscopies with stents or biopsy    ESOPHAGOGASTRODUODENOSCOPY  10/2013   EYE SURGERY     bilateral cataract removal   Gamma knife radiation  01/2018   for trigeminal neuralgia   REFRACTIVE SURGERY Bilateral 2022   RHIZOTOMY Left 04/28/2021   Procedure: Left Percutaneous trigeminal balloon rhizotomy;  Surgeon: Judith Part, MD;  Location: [facility];  Service: Neurosurgery;  Laterality: Left;   TONSILLECTOMY     Past Medical History:  Diagnosis Date   Adenomatous colon polyp 03/24/2018   Allergy    Arthritis    Cataract    Chronic headaches    Depression    Fibromyalgia    Gallstones    GERD (gastroesophageal reflux disease)     Hyperlipemia    Hypertension    Interstitial cystitis    Lactose intolerance    LBBB (left bundle branch block)    Osteoarthritis    Osteopenia    Pneumonia    Thyroid disease    Tinnitus    Trigeminal neuralgia    There were no vitals taken for this visit.  Opioid Risk Score:   Fall Risk Score:  `1  Depression screen PHQ 2/9  Depression screen [facility] 2/9 06/02/2021 05/06/2021 10/28/2020 06/26/2020 05/14/2020 02/14/2020 02/07/2020  Decreased Interest 1 0 0 0 0 0 3  Down, Depressed, Hopeless 1 - 0 0 0 0 2  PHQ - 2 Score 2 0 0 0 0 0 5  Altered sleeping - 0 - - - - 0  Tired, decreased energy - - - - - - 3  Change in appetite - - - - - - 0  Feeling bad or failure about yourself  - - - - - - 0  Trouble concentrating - - - - - - 0  Moving slowly or fidgety/restless - - - - - - 0  Suicidal thoughts - - - - - - 0  PHQ-9 Score - 0 - - - - 8  Difficult doing work/chores - - - - - - Very difficult    Review of Systems  Constitutional: Negative.   HENT: Negative.    Eyes:  Positive for visual disturbance.  Respiratory: Negative.    Cardiovascular: Negative.   Gastrointestinal: Negative.   Endocrine: Negative.   Genitourinary: Negative.   Musculoskeletal: Negative.        Pain in left side of face  Skin: Negative.   Allergic/Immunologic: Negative.   Neurological:  Positive for dizziness, tremors, light-headedness and numbness.       Tingling   Hematological: Negative.   Psychiatric/Behavioral: Negative.    All other systems reviewed and are negative.     Objective:   Physical Exam Not performed as patient was seen via phone.  Assessment & Plan:  [name] is an 81 year old woman who presents for f/u of left sided trigeminal neuralgia and dizziness.    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 She does have a history of migraines.  -reviewed patient's medical notes -discussed MRI brain finding of  cyst that is new from last MRI- located on left symptomatic side and radiology read suggests if could be post-surgical -recommended going to ED given darkening of skin, worsening eye closure, finding of cyst, dizziness to receive prompt neurosurgical eval regarding if surgery is necessary, and also for pain management given worsening pain refractory to all treatments and resulting suicidality. -Discussed retrying the Gabapentin 300mg  TID.  -Discussed that turmeric  -discussed steroid taper. -continue savella, husband feels like it is helping with her suicidal thoughts. Increase to 25mg .  -continue hydrocodone.  -d/c topamax -she does have another procedure scheduled. -discussed benefits of infrared light.  -Norco prescribed 5mg  TID PRN -follow-up monthly with Zella Ball and with me in February, placed on waitlist for earlier appointment with me, advised phone visits/MyChart are a great way to communicate in the interim -discussed that she can take an additional one or two tylenol for breakthrough pain -recommended checking CMP for liver enzymes with next labs -prescribed NAC 600mg  BID to protect the liver while taking tylenol, discussed its health benefits for mitochondrial function -d/c Baclofen  -f/u with Dr. Joaquim Nam.  -encouraged anti-inflammatory diet.  -she had sinusitis that was viral and was treated with antibiotics. This was in the early 25s. The symptoms resolved after 2 months. She uses to get sinus infections and saline rinse.  -She has had multiple root canals.  -Discontinue 100% oxygen via facemask as this was not helpful.  -Discussed that unfortunately [employer] PNS is not indicated for craniofacial pain. -Amitriptyline and Lyrica did not help.  -Continue Cymbalta t60mg .  -Topiramate helped but caused dizziness.  -Provided with hand out with information regarding trigeminal neuralgia -Retry the Phenytoin.  -She would like to try biofeedback. -reviewed neurology note.  -will try  trigger point injections next visit.  -Continue Ketamine 10%, Baclofen 2%, Cyclobenzaprine 2%, Ketoprofen 10%, Gabapentin 6%, Bupivacaine 1%, Amitryptiline 5% to apply to painful areas- using 3-4 times per day.  -Can consider Meloxicam if Phenytoin does not help.  -Discussed Qutenza as an option for neuropathic pain control. Discussed that this is a capsaicin patch, stronger than capsaicin cream. Discussed that it is currently approved for diabetic peripheral neuropathy and post-herpetic neuralgia, but that it has also shown benefit in treating other forms of neuropathy. Provided patient with link to site to learn more about the patch: [employer]. Discussed that the patch would be placed in office and benefits usually last 3 months. Discussed that unintended exposure to capsaicin can cause severe irritation of eyes, mucous membranes, respiratory tract, and skin, but that Qutenza is a local treatment and does not have the systemic side effects of other nerve medications. Discussed that there may be pain, itching, erythema, and decreased sensory function associated with the application of Qutenza. Side effects usually subside within 1 week. A cold pack of analgesic medications can help with these side effects. Blood pressure can also be increased due to pain associated with administration of the patch.  -she cut down on her gabapentin due to her dizziness    Turmeric to reduce inflammation--can be used in cooking or taken as a supplement.  Benefits of turmeric:  -Highly anti-inflammatory  -  Increases antioxidants  -Improves memory, attention, brain disease  -Lowers risk of heart disease  -May help prevent cancer  -Decreases pain  -Alleviates depression  -Delays aging and decreases risk of chronic disease  -Consume with black pepper to increase absorption    Turmeric Milk Recipe:  1 cup milk  1 tsp turmeric  1 tsp cinnamon  1 tsp grated ginger (optional)  Black  pepper (boosts the anti-inflammatory properties of turmeric).  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  -she is a good metabolizer of amitriptyline and has tolerated nortrypilline in the past, but without much benefit.    3) CKD: encouraged 608 glasses of water per day. She states that she had AKI with Ibuprofen in the past. Discussed checking Cr level next visit if we are considering Meloxicam -Discussed that Nucynta, and other pain medications, may stay in her bloodstream longer given her CKD   4) Hypernatremia: encouraged hydration   5) General health and pain: provided with list of healthy foods that are both nutritious and fight pain.   6) Depressed: Amitriptyline did help with this but she could not tolerate the drowsiness. Encouraged focusing on the factors that she can control, such as anti-inflammatory diet.   7) Fibromyalgia: Continue 30mg  of steroids per day for fibromyalgia. She is on that intermittently. She started this again about a month ago. She has been on this about a year.  8) Dizziness: -discussed that this could be from the combination of gabapentin and baclofen.  -discussed wean off gabapentin  9) Constipation:  -Provided list of following foods that help with constipation and highlighted a few: 1) prunes- contain high amounts of fiber.  2) apples- has a form of dietary fiber called  pectin that accelerates stool movement and increases beneficial gut bacteria 3) pears- in addition to fiber, also high in fructose and sorbitol which have laxative effect 4) figs- contain an enzyme ficin which helps to speed colonic transit 5) kiwis- contain an enzyme actinidin that improves gut motility and reduces constipation 6) oranges- rich in pectin (like apples) 7) grapefruits- contain a flavanol naringenin which has a laxative effect 8) vegetables- rich in fiber and also great sources of folate, vitamin C, and K 9) artichoke- high in inulin, prebiotic great for the microbiome 10) chicory- increases stool frequency and softness (can be added to coffee) 11) rhubarb- laxative effect 12) sweet potato- high fiber 13) beans, peas, and lentils- contain both soluble and insoluble fiber 14) chia seeds- improves intestinal health and gut flora 15) flaxseeds- laxative effect 16) whole grain rye bread- high in fiber 17) oat bran- high in soluble and insoluble fiber 18) kefir- softens stools -recommended to try at least one of these foods every day.  -drink 6-8 glasses of water per day -walk regularly, especially after meals.   10) Osteopenia - was tapered off the prednisone -discussed that currently her pain may be more of a risk to her than osteopenia given her  suicidal ideation from her pain  6 minutes spent in discussion of her MRI results, worsening pain and neurological symptoms, recommending going to ED.

## 2021-07-03 ENCOUNTER — Telehealth (HOSPITAL_COMMUNITY): Payer: Self-pay | Admitting: Emergency Medicine

## 2021-07-03 ENCOUNTER — Encounter (HOSPITAL_BASED_OUTPATIENT_CLINIC_OR_DEPARTMENT_OTHER): Payer: Medicare Other | Admitting: Physical Medicine and Rehabilitation

## 2021-07-03 DIAGNOSIS — G930 Cerebral cysts: Secondary | ICD-10-CM

## 2021-07-03 DIAGNOSIS — G500 Trigeminal neuralgia: Secondary | ICD-10-CM | POA: Diagnosis not present

## 2021-07-03 MED ORDER — FENTANYL 12 MCG/HR TD PT72: 1.0000 | MEDICATED_PATCH | TRANSDERMAL | 0 refills | Status: DC

## 2021-07-03 NOTE — Telephone Encounter (Signed)
Patient went to fill her prescription provided yesterday, and pharmacy was unwilling to distribute the written quantity, reportedly medication must be distributed as a box of 5.  Prescription changed to reflect this.

## 2021-07-03 NOTE — ED Notes (Signed)
DC instructions reviewed with pt. Pt verbalized understanding.  PT DC.  

## 2021-07-04 ENCOUNTER — Other Ambulatory Visit: Payer: Self-pay

## 2021-07-04 ENCOUNTER — Telehealth: Payer: Self-pay

## 2021-07-04 ENCOUNTER — Encounter (HOSPITAL_BASED_OUTPATIENT_CLINIC_OR_DEPARTMENT_OTHER): Payer: Medicare Other | Admitting: Physical Medicine and Rehabilitation

## 2021-07-04 ENCOUNTER — Other Ambulatory Visit: Payer: Self-pay | Admitting: Physical Medicine and Rehabilitation

## 2021-07-04 DIAGNOSIS — G500 Trigeminal neuralgia: Secondary | ICD-10-CM | POA: Diagnosis not present

## 2021-07-04 MED ORDER — FENTANYL 25 MCG/HR TD PT72: 1.0000 | MEDICATED_PATCH | TRANSDERMAL | 0 refills | Status: DC

## 2021-07-04 NOTE — Progress Notes (Signed)
Subjective:    Patient ID: [name], female    DOB: 1940-08-10, 81 y.o.   MRN: [id number]  HPI  An audio/video tele-health visit is felt to be the most appropriate encounter for this patient at this time. This is a follow up tele-visit via phone. The patient is at home. MD is at office. Prior to scheduling this appointment, our staff discussed the limitations of evaluation and management by telemedicine and the availability of in-person appointments. The patient expressed understanding and agreed to proceed.   [name] presents today for follow-up of severe trigeminal neuralgia.   1) Severe trigeminal neuralgia: -Given darkening of skin and worsening eye lid closure, MRI brain stat obtained and shows new cyst that could be post-surgical. Recommended she go to ED given worsening dizziness and facial swelling as well. ED physician discussed case with NSGY and neurology and discharged home recommending follow-up with Dr. Gean Quint and Dr. Merceda Elks. She was given IV Fentanyl in the ED which helped and discharged on 16mcg Fentanyl patch, which has not helped.  -discussed increasing Fentanyl patch to 73mcg since she has not experienced any negative side effects, no respiratory depression, and her husband is agreeable. I have sent in new script to start Saturday, but insurance required prior auth, which was approved.  -Husband has called Dr. Dionne Ano and Dr. Earlie Raveling office to scheduled follow-up appointments -Will run out of Fentanyl patch by Sunday -pain is worsening and nothing is providing relief -she asks whether she should go to ED -she has also noted dizziness and is not sure if this is from being in the MRI or from the [place].  -morphine was not effective and caused itching, sarna lotion did help wit this.  -hydrocodone has been most effective thus far -nucynta was not effective but she is not sure whether she gave it a fair trial -she has month appointments scheduled with  Zella Ball and f/u with me in Feb -she used the Norco last night and this morning with good benefit and has started to feel the pain return this afternoon -she did find benefit from the compound cream- she continues to use this.  -She continues to have a sharp shooting pain doing around her eyelid and the tip teeth and in the top of her head.  -has a radiofrequency ablation treatment that helped but not completely -she is not having as much sharp knife-like pin like in her eye Her top teeth in her left law have been hurting really badly.  -she now takes the baclofen with the gabapentin and they seem to work well together.  -she had surgical procedure and feels worse -yesterday she took a half a tablet but this morning she took a whole one. She does fine with the hydrocodone but a whole one makes her too sleepy.  -she is taking 2 100mg  Gabapentin three times per day but it makes her dizzy and sleepy so she cannot funciton -the cold worsens her pain.  -she did get one week benefit from the steroid injection.  -she is doing terribly -she is miserable -she feels depressed due to the pain -she has considered taking the whole bottle of hydrocodone because because she can't take this pain anymore.  -she started taking the Gabapentin 300mg  TID.  -she does find relief from the hydrocodone -the pain is constant -she wonders if it is inflammation.  -she has not tried turmeric. Her son-in-law gave her a bottle and she couldn't stomach it.   2) Fibromyalgia: -She has  been almost nonfunctional due to the severity of her pain. It continues to be very severe.  -She would like to wean off the Gabapentin as she feels at risk of falls -she is taking Topamax BID. She felt this was helpful but made her unsteady on her feet.   3) Osteoporosis: -husband asks whether she should get a Prolia shot.   4) Dizziness: -head was swimming -she felt like she was going to fall.   -She tried the Phenytoin for 2 days and  did not feel benefit so stopped it.   -she feels the gabapentin aggravates this and she would love to wean off but knows it helps her.   -She feels very sleepy during the day.   -She has never tried Topirmate before.   Prior history:  Her pain continues to be in her left eye, forehead. She has not tried any topicals for pain. She uses capsaicin for her back and knees.   Since last visit nothing we have tried has helped with her pain. She has tried to decrease her Gabapentin dose but then her pain increased so she has resumed taking 4 Gabapentin per day.   Her husband asks whether he should contact her surgeon regarding whether nerve may have become unclamped.   Discussed prolotherapy, trigger point injections, Baclofen, Clonazepam, Phenytoin as other options we could try,=.  Pain continues to be excruciating and she states she is not sure how much longer she can live like this.   Prior history: She has having some nausea, lightheadedness and felt this may be related to her Gabapentin. She did not take any yesterday and then felt more sever pain in the evening. She also gets electrical shooting across her jaw.   She does not feel that she has cluster headaches as the pain has been so constant. She feels that she is getting worse. The oxygen made her feel relaxed but it is not helping.   The Sarna lotion does help with the itching in her head.   She was willing to try the Amitriptyline but it makes her too sleepy in the morning.   Her teeth and left eye incredibly hurt.   She feels that the Gabapentin has been making her dizziness.   She asks about biofeedback.   The pain is 7/10 on average, similar to last time.   When the pain is severe it can make her depressed.   Pain Inventory Average Pain 10 Pain Right Now 10 My pain is constant, sharp, burning, dull, stabbing, tingling, and aching  In the last 24 hours, has pain interfered with the following? General activity  10 Relation with others 10 Enjoyment of life 10 What TIME of day is your pain at its worst? morning , daytime, evening, and night Sleep (in general) Good  Pain is worse with: bending and standing Pain improves with: heat/ice and medication Relief from Meds: 1  Family History  Problem Relation Age of Onset   Heart disease Mother    Heart disease Father    Stroke Father    Stroke Sister    Asthma Daughter    Colon cancer Neg Hx    Social History   Socioeconomic History   Marital status: Married    Spouse name: Not on file   Number of children: 2   Years of education: college   Highest education level: Not on file  Occupational History   Occupation: housewife    Employer: RETIRED  Tobacco Use   Smoking  status: Never   Smokeless tobacco: Never  Vaping Use   Vaping Use: Never used  Substance and Sexual Activity   Alcohol use: No    Alcohol/week: 0.0 standard drinks   Drug use: No   Sexual activity: Not on file  Other Topics Concern   Not on file  Social History Narrative   1 cup of Jasmine tea daily    Social Determinants of Health   Financial Resource Strain: Not on file  Food Insecurity: Not on file  Transportation Needs: Not on file  Physical Activity: Not on file  Stress: Not on file  Social Connections: Not on file   Past Surgical History:  Procedure Laterality Date   CHOLECYSTECTOMY  03/2010   COLONOSCOPY  2009   Dr. Raford Pitcher    CYSTOSCOPY     Multiple Cystoscopies with stents or biopsy    ESOPHAGOGASTRODUODENOSCOPY  10/2013   EYE SURGERY     bilateral cataract removal   Gamma knife radiation  01/2018   for trigeminal neuralgia   REFRACTIVE SURGERY Bilateral 2022   RHIZOTOMY Left 04/28/2021   Procedure: Left Percutaneous trigeminal balloon rhizotomy;  Surgeon: Judith Part, MD;  Location: [facility];  Service: Neurosurgery;  Laterality: Left;   TONSILLECTOMY     Past Surgical History:  Procedure Laterality Date   CHOLECYSTECTOMY   03/2010   COLONOSCOPY  2009   Dr. Raford Pitcher    CYSTOSCOPY     Multiple Cystoscopies with stents or biopsy    ESOPHAGOGASTRODUODENOSCOPY  10/2013   EYE SURGERY     bilateral cataract removal   Gamma knife radiation  01/2018   for trigeminal neuralgia   REFRACTIVE SURGERY Bilateral 2022   RHIZOTOMY Left 04/28/2021   Procedure: Left Percutaneous trigeminal balloon rhizotomy;  Surgeon: Judith Part, MD;  Location: [facility];  Service: Neurosurgery;  Laterality: Left;   TONSILLECTOMY     Past Medical History:  Diagnosis Date   Adenomatous colon polyp 03/24/2018   Allergy    Arthritis    Cataract    Chronic headaches    Depression    Fibromyalgia    Gallstones    GERD (gastroesophageal reflux disease)    Hyperlipemia    Hypertension    Interstitial cystitis    Lactose intolerance    LBBB (left bundle branch block)    Osteoarthritis    Osteopenia    Pneumonia    Thyroid disease    Tinnitus    Trigeminal neuralgia    There were no vitals taken for this visit.  Opioid Risk Score:   Fall Risk Score:  `1  Depression screen PHQ 2/9  Depression screen [facility] 2/9 06/02/2021 05/06/2021 10/28/2020 06/26/2020 05/14/2020 02/14/2020 02/07/2020  Decreased Interest 1 0 0 0 0 0 3  Down, Depressed, Hopeless 1 - 0 0 0 0 2  PHQ - 2 Score 2 0 0 0 0 0 5  Altered sleeping - 0 - - - - 0  Tired, decreased energy - - - - - - 3  Change in appetite - - - - - - 0  Feeling bad or failure about yourself  - - - - - - 0  Trouble concentrating - - - - - - 0  Moving slowly or fidgety/restless - - - - - - 0  Suicidal thoughts - - - - - - 0  PHQ-9 Score - 0 - - - - 8  Difficult doing work/chores - - - - - - Very difficult  Review of Systems  Constitutional: Negative.   HENT: Negative.    Eyes:  Positive for visual disturbance.  Respiratory: Negative.    Cardiovascular: Negative.   Gastrointestinal: Negative.   Endocrine: Negative.   Genitourinary: Negative.   Musculoskeletal: Negative.         Pain in left side of face  Skin: Negative.   Allergic/Immunologic: Negative.   Neurological:  Positive for dizziness, tremors, light-headedness and numbness.       Tingling   Hematological: Negative.   Psychiatric/Behavioral: Negative.    All other systems reviewed and are negative.     Objective:   Physical Exam Not performed as patient was seen via phone.     Assessment & Plan:  [name] is an 81 year old woman who presents for f/u of left sided trigeminal neuralgia and dizziness.    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 She does have a history of migraines.  -reviewed patient's medical notes -recommended going to ED given darkening of skin, worsening eye closure, finding of cyst, dizziness to receive prompt neurosurgical eval regarding if surgery is necessary, and also for pain management given worsening pain refractory to all treatments and resulting suicidality. Reviewed ED doc notes- he discussed with on-call neurology and NSGY and determined that patient could be discharged with outpatient follow-up. IV fentanyl was administered with relief to patient. She was discharged on fentanyl patch which has not provided relief. She is getting 53mcg- no side effects- discussed increasing to 80mcg and her husband is in agreement. Prior auth completed and approved. Discussed with husband starting patch on Satruday after she has worn 83mcg patch for 72 hours -encouraged follow-up with Dr. Joaquim Nam, husband let me know appointment has been set up 1/18.  -recommended follow-up with Dr. Merceda Elks given cyst in left posterior brain, possibly postsurgical, worsening dizziness, pain, and facial swelling to see if she needs neurosurgical intervention -discussed MRI brain finding of cyst that is new from last MRI- located on left symptomatic side and radiology read suggests if could be post-surgical -Discussed retrying the  Gabapentin 300mg  TID.  -Discussed that turmeric  -discussed steroid taper. -continue savella, husband feels like it is helping with her suicidal thoughts. Increase to 25mg .  -continue hydrocodone.  -d/c topamax -she does have another procedure scheduled. -discussed benefits of infrared light.  -Norco prescribed 5mg  TID PRN -follow-up monthly with Zella Ball and with me in February, placed on waitlist for earlier appointment with me, advised phone visits/MyChart are a great way to communicate in the interim -discussed that she can take an additional one or two tylenol for breakthrough pain -recommended checking CMP for liver enzymes with next labs -prescribed NAC 600mg  BID to protect the liver while taking tylenol, discussed its health benefits for mitochondrial function -d/c Baclofen  -f/u with Dr. Joaquim Nam.  -encouraged anti-inflammatory diet.  -she had sinusitis that was viral and was treated with antibiotics. This was in the early 59s. The symptoms resolved after 2 months. She uses to get sinus infections and saline rinse.  -She has had multiple root canals.  -Discontinue 100% oxygen via facemask as this was not helpful.  -Discussed that unfortunately Sprint PNS is not indicated for craniofacial pain. -Amitriptyline and Lyrica did not help.  -Continue Cymbalta t60mg .  -Topiramate helped but caused dizziness.  -Provided with hand out with information regarding trigeminal neuralgia -Retry the Phenytoin.  -She would like to try biofeedback. -reviewed neurology note.  -will  try trigger point injections next visit.  -Continue Ketamine 10%, Baclofen 2%, Cyclobenzaprine 2%, Ketoprofen 10%, Gabapentin 6%, Bupivacaine 1%, Amitryptiline 5% to apply to painful areas- using 3-4 times per day.  -Can consider Meloxicam if Phenytoin does not help.  -Discussed Qutenza as an option for neuropathic pain control. Discussed that this is a capsaicin patch, stronger than capsaicin cream. Discussed that it is  currently approved for diabetic peripheral neuropathy and post-herpetic neuralgia, but that it has also shown benefit in treating other forms of neuropathy. Provided patient with link to site to learn more about the patch: [employer]. Discussed that the patch would be placed in office and benefits usually last 3 months. Discussed that unintended exposure to capsaicin can cause severe irritation of eyes, mucous membranes, respiratory tract, and skin, but that Qutenza is a local treatment and does not have the systemic side effects of other nerve medications. Discussed that there may be pain, itching, erythema, and decreased sensory function associated with the application of Qutenza. Side effects usually subside within 1 week. A cold pack of analgesic medications can help with these side effects. Blood pressure can also be increased due to pain associated with administration of the patch.  -she cut down on her gabapentin due to her dizziness    Turmeric to reduce inflammation--can be used in cooking or taken as a supplement.  Benefits of turmeric:  -Highly anti-inflammatory  -Increases antioxidants  -Improves memory, attention, brain disease  -Lowers risk of heart disease  -May help prevent cancer  -Decreases pain  -Alleviates depression  -Delays aging and decreases risk of chronic disease  -Consume with black pepper to increase absorption    Turmeric Milk Recipe:  1 cup milk  1 tsp turmeric  1 tsp cinnamon  1 tsp grated ginger (optional)  Black pepper (boosts the anti-inflammatory properties of turmeric).  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  -she is a good metabolizer of amitriptyline and has tolerated nortrypilline in the past, but without much benefit.    3) CKD: encouraged 608 glasses of water per day. She states that she had AKI with Ibuprofen in the past. Discussed checking Cr level next visit if we are considering Meloxicam -Discussed that Nucynta, and other pain medications, may stay in her bloodstream longer given her CKD   4) Hypernatremia: encouraged hydration   5) General health and pain: provided with list of healthy foods that are both nutritious and fight pain.   6) Depressed: Amitriptyline did help with this but she could not tolerate the drowsiness. Encouraged focusing on the factors that she can control, such as anti-inflammatory diet.   7) Fibromyalgia: Continue 30mg  of steroids per day for fibromyalgia. She is on that intermittently. She started this again about a month ago. She has been on this about a year.  8) Dizziness: -discussed that this could be from the combination of gabapentin and baclofen.  -discussed wean off gabapentin  9) Constipation:  -Provided  list of following foods that help with constipation and highlighted a few: 1) prunes- contain high amounts of fiber.  2) apples- has a form of dietary fiber called pectin that accelerates stool movement and increases beneficial gut bacteria 3) pears- in addition to fiber, also high in fructose and sorbitol which have laxative effect 4) figs- contain an enzyme ficin which helps to speed colonic transit 5) kiwis- contain an enzyme actinidin that improves gut motility and reduces constipation 6) oranges- rich in pectin (like apples) 7) grapefruits- contain a flavanol naringenin  which has a laxative effect 8) vegetables- rich in fiber and also great sources of folate, vitamin C, and K 9) artichoke- high in inulin, prebiotic great for the microbiome 10) chicory- increases stool frequency and softness (can be added to coffee) 11) rhubarb- laxative effect 12) sweet potato- high fiber 13) beans, peas, and lentils- contain both soluble and insoluble fiber 14) chia seeds- improves intestinal health and gut flora 15) flaxseeds- laxative effect 16) whole grain rye bread- high in fiber 17) oat bran- high in soluble and insoluble fiber 18) kefir- softens stools -recommended to try at least one of these foods every day.  -drink 6-8 glasses of water per day -walk regularly, especially after meals.   10) Osteopenia - was tapered off the prednisone -discussed that currently her pain may be more of a risk to her than osteopenia given her suicidal ideation from her pain  5 minutes spent in discussing her worsening pain, has returned to levels prior ED visit, does not seem to be improving with 12 mcg fentanyl patch, needed prior auth for 9mcg patch, advised husband to call our clinic and our staff has taken care of this- 57mcg approved, advised husband to start this on Saturday

## 2021-07-04 NOTE — Progress Notes (Signed)
Subjective:    Patient ID: [name], female    DOB: 09/22/1940, 81 y.o.   MRN: [id number]  HPI  An audio/video tele-health visit is felt to be the most appropriate encounter for this patient at this time. This is a follow up tele-visit via phone. The patient is at home. MD is at office. Prior to scheduling this appointment, our staff discussed the limitations of evaluation and management by telemedicine and the availability of in-person appointments. The patient expressed understanding and agreed to proceed.   [name] presents today for follow-up of severe trigeminal neuralgia.   1) Severe trigeminal neuralgia: Given darkening of skin and worsening eye lid closure, MRI brain stat obtained and shows new cyst that could be post-surgical. Recommended she go to ED given worsening dizziness and facial swelling as well. ED physician discussed case with NSGY and neurology and discharged home recommending follow-up with Dr. Gean Quint and Dr. Merceda Elks. She was given IV Fentanyl in the ED which helped and discharged on 56mcg Fentanyl patch, which has not helped.  -Husband has called Dr. Dionne Ano and Dr. Earlie Raveling office to scheduled follow-up appointments -Will run out of Fentanyl patch by Sunday -pain is worsening and nothing is providing relief -she asks whether she should go to ED -she has also noted dizziness and is not sure if this is from being in the MRI or from the [place].  -morphine was not effective and caused itching, sarna lotion did help wit this.  -hydrocodone has been most effective thus far -nucynta was not effective but she is not sure whether she gave it a fair trial -she has month appointments scheduled with Zella Ball and f/u with me in Feb -she used the Norco last night and this morning with good benefit and has started to feel the pain return this afternoon -she did find benefit from the compound cream- she continues to use this.  -She continues to have a sharp  shooting pain doing around her eyelid and the tip teeth and in the top of her head.  -has a radiofrequency ablation treatment that helped but not completely -she is not having as much sharp knife-like pin like in her eye Her top teeth in her left law have been hurting really badly.  -she now takes the baclofen with the gabapentin and they seem to work well together.  -she had surgical procedure and feels worse -yesterday she took a half a tablet but this morning she took a whole one. She does fine with the hydrocodone but a whole one makes her too sleepy.  -she is taking 2 100mg  Gabapentin three times per day but it makes her dizzy and sleepy so she cannot funciton -the cold worsens her pain.  -she did get one week benefit from the steroid injection.  -she is doing terribly -she is miserable -she feels depressed due to the pain -she has considered taking the whole bottle of hydrocodone because because she can't take this pain anymore.  -she started taking the Gabapentin 300mg  TID.  -she does find relief from the hydrocodone -the pain is constant -she wonders if it is inflammation.  -she has not tried turmeric. Her son-in-law gave her a bottle and she couldn't stomach it.   2) Fibromyalgia: -She has been almost nonfunctional due to the severity of her pain. It continues to be very severe.  -She would like to wean off the Gabapentin as she feels at risk of falls -she is taking Topamax BID. She felt this was  helpful but made her unsteady on her feet.   3) Osteoporosis: -husband asks whether she should get a Prolia shot.   4) Dizziness: -head was swimming -she felt like she was going to fall.   -She tried the Phenytoin for 2 days and did not feel benefit so stopped it.   -she feels the gabapentin aggravates this and she would love to wean off but knows it helps her.   -She feels very sleepy during the day.   -She has never tried Topirmate before.   Prior history:  Her pain  continues to be in her left eye, forehead. She has not tried any topicals for pain. She uses capsaicin for her back and knees.   Since last visit nothing we have tried has helped with her pain. She has tried to decrease her Gabapentin dose but then her pain increased so she has resumed taking 4 Gabapentin per day.   Her husband asks whether he should contact her surgeon regarding whether nerve may have become unclamped.   Discussed prolotherapy, trigger point injections, Baclofen, Clonazepam, Phenytoin as other options we could try,=.  Pain continues to be excruciating and she states she is not sure how much longer she can live like this.   Prior history: She has having some nausea, lightheadedness and felt this may be related to her Gabapentin. She did not take any yesterday and then felt more sever pain in the evening. She also gets electrical shooting across her jaw.   She does not feel that she has cluster headaches as the pain has been so constant. She feels that she is getting worse. The oxygen made her feel relaxed but it is not helping.   The Sarna lotion does help with the itching in her head.   She was willing to try the Amitriptyline but it makes her too sleepy in the morning.   Her teeth and left eye incredibly hurt.   She feels that the Gabapentin has been making her dizziness.   She asks about biofeedback.   The pain is 7/10 on average, similar to last time.   When the pain is severe it can make her depressed.   Pain Inventory Average Pain 10 Pain Right Now 10 My pain is constant, sharp, burning, dull, stabbing, tingling, and aching  In the last 24 hours, has pain interfered with the following? General activity 10 Relation with others 10 Enjoyment of life 10 What TIME of day is your pain at its worst? morning , daytime, evening, and night Sleep (in general) Good  Pain is worse with: bending and standing Pain improves with: heat/ice and medication Relief from  Meds: 1  Family History  Problem Relation Age of Onset   Heart disease Mother    Heart disease Father    Stroke Father    Stroke Sister    Asthma Daughter    Colon cancer Neg Hx    Social History   Socioeconomic History   Marital status: Married    Spouse name: Not on file   Number of children: 2   Years of education: college   Highest education level: Not on file  Occupational History   Occupation: housewife    Employer: RETIRED  Tobacco Use   Smoking status: Never   Smokeless tobacco: Never  Vaping Use   Vaping Use: Never used  Substance and Sexual Activity   Alcohol use: No    Alcohol/week: 0.0 standard drinks   Drug use: No   Sexual  activity: Not on file  Other Topics Concern   Not on file  Social History Narrative   1 cup of Jasmine tea daily    Social Determinants of Health   Financial Resource Strain: Not on file  Food Insecurity: Not on file  Transportation Needs: Not on file  Physical Activity: Not on file  Stress: Not on file  Social Connections: Not on file   Past Surgical History:  Procedure Laterality Date   CHOLECYSTECTOMY  03/2010   COLONOSCOPY  2009   Dr. Raford Pitcher    CYSTOSCOPY     Multiple Cystoscopies with stents or biopsy    ESOPHAGOGASTRODUODENOSCOPY  10/2013   EYE SURGERY     bilateral cataract removal   Gamma knife radiation  01/2018   for trigeminal neuralgia   REFRACTIVE SURGERY Bilateral 2022   RHIZOTOMY Left 04/28/2021   Procedure: Left Percutaneous trigeminal balloon rhizotomy;  Surgeon: Judith Part, MD;  Location: [facility];  Service: Neurosurgery;  Laterality: Left;   TONSILLECTOMY     Past Surgical History:  Procedure Laterality Date   CHOLECYSTECTOMY  03/2010   COLONOSCOPY  2009   Dr. Raford Pitcher    CYSTOSCOPY     Multiple Cystoscopies with stents or biopsy    ESOPHAGOGASTRODUODENOSCOPY  10/2013   EYE SURGERY     bilateral cataract removal   Gamma knife radiation  01/2018   for trigeminal neuralgia    REFRACTIVE SURGERY Bilateral 2022   RHIZOTOMY Left 04/28/2021   Procedure: Left Percutaneous trigeminal balloon rhizotomy;  Surgeon: Judith Part, MD;  Location: [facility];  Service: Neurosurgery;  Laterality: Left;   TONSILLECTOMY     Past Medical History:  Diagnosis Date   Adenomatous colon polyp 03/24/2018   Allergy    Arthritis    Cataract    Chronic headaches    Depression    Fibromyalgia    Gallstones    GERD (gastroesophageal reflux disease)    Hyperlipemia    Hypertension    Interstitial cystitis    Lactose intolerance    LBBB (left bundle branch block)    Osteoarthritis    Osteopenia    Pneumonia    Thyroid disease    Tinnitus    Trigeminal neuralgia    There were no vitals taken for this visit.  Opioid Risk Score:   Fall Risk Score:  `1  Depression screen PHQ 2/9  Depression screen [facility] 2/9 06/02/2021 05/06/2021 10/28/2020 06/26/2020 05/14/2020 02/14/2020 02/07/2020  Decreased Interest 1 0 0 0 0 0 3  Down, Depressed, Hopeless 1 - 0 0 0 0 2  PHQ - 2 Score 2 0 0 0 0 0 5  Altered sleeping - 0 - - - - 0  Tired, decreased energy - - - - - - 3  Change in appetite - - - - - - 0  Feeling bad or failure about yourself  - - - - - - 0  Trouble concentrating - - - - - - 0  Moving slowly or fidgety/restless - - - - - - 0  Suicidal thoughts - - - - - - 0  PHQ-9 Score - 0 - - - - 8  Difficult doing work/chores - - - - - - Very difficult    Review of Systems  Constitutional: Negative.   HENT: Negative.    Eyes:  Positive for visual disturbance.  Respiratory: Negative.    Cardiovascular: Negative.   Gastrointestinal: Negative.   Endocrine: Negative.   Genitourinary: Negative.  Musculoskeletal: Negative.        Pain in left side of face  Skin: Negative.   Allergic/Immunologic: Negative.   Neurological:  Positive for dizziness, tremors, light-headedness and numbness.       Tingling   Hematological: Negative.   Psychiatric/Behavioral: Negative.    All other  systems reviewed and are negative.     Objective:   Physical Exam Not performed as patient was seen via phone.     Assessment & Plan:  [name] is an 81 year old woman who presents for f/u of left sided trigeminal neuralgia and dizziness.    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 She does have a history of migraines.  -reviewed patient's medical notes -recommended going to ED given darkening of skin, worsening eye closure, finding of cyst, dizziness to receive prompt neurosurgical eval regarding if surgery is necessary, and also for pain management given worsening pain refractory to all treatments and resulting suicidality. Reviewed ED doc notes- he discussed with on-call neurology and NSGY and determined that patient could be discharged with outpatient follow-up. IV fentanyl was administered with relief to patient. She was discharged on fentanyl patch which has not provided relief. She is getting 75mcg- no side effects- discussed increasing to 24mcg and her husband is in agreement. -encouraged follow-up with Dr. Merceda Elks 1/13, asked husband to message me if he can not get an appointment -recommended follow-up with Dr. 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  -Discussed that turmeric  -discussed steroid taper. -continue savella, husband feels like it is helping with her suicidal thoughts. Increase to 25mg .  -continue hydrocodone.  -d/c topamax -she does have another procedure scheduled. -discussed benefits of infrared light.  -Norco prescribed 5mg  TID PRN -follow-up monthly with Zella Ball and with me in  February, placed on waitlist for earlier appointment with me, advised phone visits/MyChart are a great way to communicate in the interim -discussed that she can take an additional one or two tylenol for breakthrough pain -recommended checking CMP for liver enzymes with next labs -prescribed NAC 600mg  BID to protect the liver while taking tylenol, discussed its health benefits for mitochondrial function -d/c Baclofen  -f/u with Dr. Joaquim Nam.  -encouraged anti-inflammatory diet.  -she had sinusitis that was viral and was treated with antibiotics. This was in the early 28s. The symptoms resolved after 2 months. She uses to get sinus infections and saline rinse.  -She has had multiple root canals.  -Discontinue 100% oxygen via facemask as this was not helpful.  -Discussed that unfortunately Sprint PNS is not indicated for craniofacial pain. -Amitriptyline and Lyrica did not help.  -Continue Cymbalta t60mg .  -Topiramate helped but caused dizziness.  -Provided with hand out with information regarding trigeminal neuralgia -Retry the Phenytoin.  -She would like to try biofeedback. -reviewed neurology note.  -will try trigger point injections next visit.  -Continue Ketamine 10%, Baclofen 2%, Cyclobenzaprine 2%, Ketoprofen 10%, Gabapentin 6%, Bupivacaine 1%, Amitryptiline 5% to apply to painful areas- using 3-4 times per day.  -Can consider Meloxicam if Phenytoin does not help.  -Discussed Qutenza as an option for neuropathic pain control. Discussed that this is a capsaicin patch, stronger than  capsaicin cream. Discussed that it is currently approved for diabetic peripheral neuropathy and post-herpetic neuralgia, but that it has also shown benefit in treating other forms of neuropathy. Provided patient with link to site to learn more about the patch: [employer]. Discussed that the patch would be placed in office and benefits usually last 3 months. Discussed that unintended exposure to  capsaicin can cause severe irritation of eyes, mucous membranes, respiratory tract, and skin, but that Qutenza is a local treatment and does not have the systemic side effects of other nerve medications. Discussed that there may be pain, itching, erythema, and decreased sensory function associated with the application of Qutenza. Side effects usually subside within 1 week. A cold pack of analgesic medications can help with these side effects. Blood pressure can also be increased due to pain associated with administration of the patch.  -she cut down on her gabapentin due to her dizziness    Turmeric to reduce inflammation--can be used in cooking or taken as a supplement.  Benefits of turmeric:  -Highly anti-inflammatory  -Increases antioxidants  -Improves memory, attention, brain disease  -Lowers risk of heart disease  -May help prevent cancer  -Decreases pain  -Alleviates depression  -Delays aging and decreases risk of chronic disease  -Consume with black pepper to increase absorption    Turmeric Milk Recipe:  1 cup milk  1 tsp turmeric  1 tsp cinnamon  1 tsp grated ginger (optional)  Black pepper (boosts the anti-inflammatory properties of turmeric).  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  -she is a good metabolizer of amitriptyline and has tolerated nortrypilline in the past, but without  much benefit.    3) CKD: encouraged 608 glasses of water per day. She states that she had AKI with Ibuprofen in the past. Discussed checking Cr level next visit if we are considering Meloxicam -Discussed that Nucynta, and other pain medications, may stay in her bloodstream longer given her CKD   4) Hypernatremia: encouraged hydration   5) General health and pain: provided with list of healthy foods that are both nutritious and fight pain.   6) Depressed: Amitriptyline did help with this but she could not tolerate the drowsiness. Encouraged focusing on the factors that she can control, such as anti-inflammatory diet.   7) Fibromyalgia: Continue 30mg  of steroids per day for fibromyalgia. She is on that intermittently. She started this again about a month ago. She has been on this about a year.  8) Dizziness: -discussed that this could be from the combination of gabapentin and baclofen.  -discussed wean off gabapentin  9) Constipation:  -Provided list of following foods that help with constipation and highlighted a few: 1) prunes- contain high amounts of fiber.  2) apples- has a form of dietary fiber called pectin that accelerates stool movement and increases beneficial gut bacteria 3) pears- in addition to fiber, also high in fructose and sorbitol which have laxative effect 4) figs- contain an enzyme  ficin which helps to speed colonic transit 5) kiwis- contain an enzyme actinidin that improves gut motility and reduces constipation 6) oranges- rich in pectin (like apples) 7) grapefruits- contain a flavanol naringenin which has a laxative effect 8) vegetables- rich in fiber and also great sources of folate, vitamin C, and K 9) artichoke- high in inulin, prebiotic great for the microbiome 10) chicory- increases stool frequency and softness (can be added to coffee) 11) rhubarb- laxative effect 12) sweet potato- high fiber 13) beans, peas, and lentils- contain both soluble and insoluble  fiber 14) chia seeds- improves intestinal health and gut flora 15) flaxseeds- laxative effect 16) whole grain rye bread- high in fiber 17) oat bran- high in soluble and insoluble fiber 18) kefir- softens stools -recommended to try at least one of these foods every day.  -drink 6-8 glasses of water per day -walk regularly, especially after meals.   10) Osteopenia - was tapered off the prednisone -discussed that currently her pain may be more of a risk to her than osteopenia given her suicidal ideation from her pain  5 minutes spent in discussion of her ED visit, plan of care, follow-up with Dr. Joaquim Nam and Dr. Davy Pique, trying fentanyl patch outpatient and increasing dose if pain continues to be severe and if she is without side effects

## 2021-07-04 NOTE — Telephone Encounter (Signed)
Approved PA Case# [id number] Coverage starts 06/22/2021 12:00 am Coverage ends 06/21/22 12:00 am. Pharmacy and patient's husband has been contacted.

## 2021-07-04 NOTE — Telephone Encounter (Signed)
PA for Fentanyl patch has been submitted.

## 2021-07-08 ENCOUNTER — Encounter: Payer: Self-pay | Admitting: Physical Medicine and Rehabilitation

## 2021-07-08 ENCOUNTER — Encounter (HOSPITAL_BASED_OUTPATIENT_CLINIC_OR_DEPARTMENT_OTHER): Payer: Medicare Other | Admitting: Physical Medicine and Rehabilitation

## 2021-07-08 ENCOUNTER — Other Ambulatory Visit: Payer: Self-pay

## 2021-07-08 ENCOUNTER — Telehealth: Payer: Self-pay

## 2021-07-08 VITALS — BP 136/79 | HR 76 | Ht 63.0 in | Wt 153.0 lb

## 2021-07-08 DIAGNOSIS — G500 Trigeminal neuralgia: Secondary | ICD-10-CM

## 2021-07-08 DIAGNOSIS — R42 Dizziness and giddiness: Secondary | ICD-10-CM | POA: Diagnosis not present

## 2021-07-08 DIAGNOSIS — G930 Cerebral cysts: Secondary | ICD-10-CM | POA: Diagnosis not present

## 2021-07-08 DIAGNOSIS — G9689 Other specified disorders of central nervous system: Secondary | ICD-10-CM | POA: Diagnosis not present

## 2021-07-08 MED ORDER — LAMOTRIGINE 150 MG PO TABS: 150.0000 mg | ORAL_TABLET | Freq: Every day | ORAL | 3 refills | Status: DC

## 2021-07-08 MED ORDER — BENZOCAINE 10 % MT GEL: 1.0000 "application " | OROMUCOSAL | 0 refills | Status: AC | PRN

## 2021-07-08 NOTE — Addendum Note (Signed)
Addended by: Izora Ribas on: 07/08/2021 03:44 PM   Modules accepted: Orders

## 2021-07-08 NOTE — Progress Notes (Addendum)
Subjective:    Patient ID: [name], female    DOB: [birth year], 81 y.o.   MRN: [id number]  HPI   Mrs. Gerdts presents today for follow-up of severe trigeminal neuralgia.   1) Severe trigeminal neuralgia: -Given darkening of skin and worsening eye lid closure, MRI brain stat obtained and shows new cyst that could be post-surgical. Recommended she go to ED given worsening dizziness and facial swelling as well. ED physician discussed case with NSGY and neurology and discharged home recommending follow-up with Dr. Gean Quint and Dr. Merceda Elks. She was given IV Fentanyl in the ED which helped and discharged on 55mcg Fentanyl patch, which has not helped.  -discussed increasing Fentanyl patch to 61mcg since she has not experienced any negative side effects, no respiratory depression, and her husband is agreeable. I have sent in new script to start Saturday, but insurance required prior auth, which was approved.  -tonight would be the third tay of the 3mcg Fentanyl patch -she is still taking the Gabapentin -she feels that when she lays down she is spinning.  -eye continues to appear more closed.  -Husband has called Dr. Dionne Ano and Dr. Earlie Raveling office to scheduled follow-up appointments and has appointments with both tomorrow.  -Will run out of Fentanyl patch by Sunday -pain is worsening and nothing is providing relief -she asks whether she should go to ED -she has also noted dizziness and is not sure if this is from being in the MRI or from the [place].  -morphine was not effective and caused itching, sarna lotion did help wit this.  -hydrocodone has been most effective thus far -nucynta was not effective but she is not sure whether she gave it a fair trial -she has month appointments scheduled with Zella Ball and f/u with me in Feb -she used the Norco last night and this morning with good benefit and has started to feel the pain return this afternoon -she did find benefit from the  compound cream- she continues to use this.  -She continues to have a sharp shooting pain doing around her eyelid and the tip teeth and in the top of her head.  -has a radiofrequency ablation treatment that helped but not completely -she is not having as much sharp knife-like pin like in her eye Her top teeth in her left law have been hurting really badly.  -she now takes the baclofen with the gabapentin and they seem to work well together.  -she had surgical procedure and feels worse -yesterday she took a half a tablet but this morning she took a whole one. She does fine with the hydrocodone but a whole one makes her too sleepy.  -she is taking 2 100mg  Gabapentin three times per day but it makes her dizzy and sleepy so she cannot funciton -the cold worsens her pain.  -The IV fentanyl did help with the pain.  -her husband feels she should get one more dose of the 25mg  fentanyl. -she did get one week benefit from the steroid injection.  -she is doing terribly -she is miserable -she feels depressed due to the pain -she has considered taking the whole bottle of hydrocodone because because she can't take this pain anymore.  -she started taking the Gabapentin 300mg  TID.  -she does find relief from the hydrocodone -the pain is constant -she wonders if it is inflammation.  -she has not tried turmeric. Her son-in-law gave her a bottle and she couldn't stomach it.   2) Fibromyalgia: -She has been almost  nonfunctional due to the severity of her pain. It continues to be very severe.  -She would like to wean off the Gabapentin as she feels at risk of falls -she is taking Topamax BID. She felt this was helpful but made her unsteady on her feet.   3) Osteoporosis: -husband asks whether she should get a Prolia shot.   4) Dizziness: -head was swimming -she felt like she was going to fall.  -worsening -feels like she is spinning, not the room  -She tried the Phenytoin for 2 days and did not feel  benefit so stopped it.   -she feels the gabapentin aggravates this and she would love to wean off but knows it helps her.   -She feels very sleepy during the day.   -She has never tried Topirmate before.   Prior history:  Her pain continues to be in her left eye, forehead. She has not tried any topicals for pain. She uses capsaicin for her back and knees.   Since last visit nothing we have tried has helped with her pain. She has tried to decrease her Gabapentin dose but then her pain increased so she has resumed taking 4 Gabapentin per day.   Her husband asks whether he should contact her surgeon regarding whether nerve may have become unclamped.   Discussed prolotherapy, trigger point injections, Baclofen, Clonazepam, Phenytoin as other options we could try,=.  Pain continues to be excruciating and she states she is not sure how much longer she can live like this.   Prior history: She has having some nausea, lightheadedness and felt this may be related to her Gabapentin. She did not take any yesterday and then felt more sever pain in the evening. She also gets electrical shooting across her jaw.   She does not feel that she has cluster headaches as the pain has been so constant. She feels that she is getting worse. The oxygen made her feel relaxed but it is not helping.   The Sarna lotion does help with the itching in her head.   She was willing to try the Amitriptyline but it makes her too sleepy in the morning.   Her teeth and left eye incredibly hurt.   She feels that the Gabapentin has been making her dizziness.   She asks about biofeedback.   The pain is 7/10 on average, similar to last time.   When the pain is severe it can make her depressed.   Pain Inventory Average Pain 10 Pain Right Now 10 My pain is constant, sharp, burning, dull, stabbing, tingling, and aching  In the last 24 hours, has pain interfered with the following? General activity 8 Relation with  others 8 Enjoyment of life 8 What TIME of day is your pain at its worst? morning  Sleep (in general) Good  Pain is worse with: bending and standing Pain improves with: rest and heat & warm H2O over the head Relief from Meds: 5  Family History  Problem Relation Age of Onset   Heart disease Mother    Heart disease Father    Stroke Father    Stroke Sister    Asthma Daughter    Colon cancer Neg Hx    Social History   Socioeconomic History   Marital status: Married    Spouse name: Not on file   Number of children: 2   Years of education: college   Highest education level: Not on file  Occupational History   Occupation: housewife  Employer: RETIRED  Tobacco Use   Smoking status: Never   Smokeless tobacco: Never  Vaping Use   Vaping Use: Never used  Substance and Sexual Activity   Alcohol use: No    Alcohol/week: 0.0 standard drinks   Drug use: No   Sexual activity: Not on file  Other Topics Concern   Not on file  Social History Narrative   1 cup of Jasmine tea daily    Social Determinants of Health   Financial Resource Strain: Not on file  Food Insecurity: Not on file  Transportation Needs: Not on file  Physical Activity: Not on file  Stress: Not on file  Social Connections: Not on file   Past Surgical History:  Procedure Laterality Date   CHOLECYSTECTOMY  03/2010   COLONOSCOPY  2009   Dr. Raford Pitcher    CYSTOSCOPY     Multiple Cystoscopies with stents or biopsy    ESOPHAGOGASTRODUODENOSCOPY  10/2013   EYE SURGERY     bilateral cataract removal   Gamma knife radiation  01/2018   for trigeminal neuralgia   REFRACTIVE SURGERY Bilateral 2022   RHIZOTOMY Left 04/28/2021   Procedure: Left Percutaneous trigeminal balloon rhizotomy;  Surgeon: Judith Part, MD;  Location: [facility];  Service: Neurosurgery;  Laterality: Left;   TONSILLECTOMY     Past Surgical History:  Procedure Laterality Date   CHOLECYSTECTOMY  03/2010   COLONOSCOPY  2009   Dr.  Raford Pitcher    CYSTOSCOPY     Multiple Cystoscopies with stents or biopsy    ESOPHAGOGASTRODUODENOSCOPY  10/2013   EYE SURGERY     bilateral cataract removal   Gamma knife radiation  01/2018   for trigeminal neuralgia   REFRACTIVE SURGERY Bilateral 2022   RHIZOTOMY Left 04/28/2021   Procedure: Left Percutaneous trigeminal balloon rhizotomy;  Surgeon: Judith Part, MD;  Location: [facility];  Service: Neurosurgery;  Laterality: Left;   TONSILLECTOMY     Past Medical History:  Diagnosis Date   Adenomatous colon polyp 03/24/2018   Allergy    Arthritis    Cataract    Chronic headaches    Depression    Fibromyalgia    Gallstones    GERD (gastroesophageal reflux disease)    Hyperlipemia    Hypertension    Interstitial cystitis    Lactose intolerance    LBBB (left bundle branch block)    Osteoarthritis    Osteopenia    Pneumonia    Thyroid disease    Tinnitus    Trigeminal neuralgia    There were no vitals taken for this visit.  Opioid Risk Score:   Fall Risk Score:  `1  Depression screen PHQ 2/9  Depression screen [facility] 2/9 06/02/2021 05/06/2021 10/28/2020 06/26/2020 05/14/2020 02/14/2020 02/07/2020  Decreased Interest 1 0 0 0 0 0 3  Down, Depressed, Hopeless 1 - 0 0 0 0 2  PHQ - 2 Score 2 0 0 0 0 0 5  Altered sleeping - 0 - - - - 0  Tired, decreased energy - - - - - - 3  Change in appetite - - - - - - 0  Feeling bad or failure about yourself  - - - - - - 0  Trouble concentrating - - - - - - 0  Moving slowly or fidgety/restless - - - - - - 0  Suicidal thoughts - - - - - - 0  PHQ-9 Score - 0 - - - - 8  Difficult doing work/chores - - - - - -  Very difficult    Review of Systems  Constitutional: Negative.   HENT: Negative.    Eyes:  Positive for visual disturbance.  Respiratory: Negative.    Cardiovascular: Negative.   Gastrointestinal: Negative.   Endocrine: Negative.   Genitourinary: Negative.   Musculoskeletal: Negative.        Pain in left side of face   Skin: Negative.   Allergic/Immunologic: Negative.   Neurological:  Positive for dizziness, tremors, light-headedness and numbness.       Tingling   Hematological: Negative.   Psychiatric/Behavioral: Negative.    All other systems reviewed and are negative.     Objective:   Physical Exam Not performed as patient was seen via phone.     Assessment & Plan:  Mrs. Nellums is an 81 year old woman who presents for f/u of left sided trigeminal neuralgia and dizziness.    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 She does have a history of migraines.  -reviewed patient's medical notes -recommended going to ED given darkening of skin, worsening eye closure, finding of cyst, dizziness to receive prompt neurosurgical eval regarding if surgery is necessary, and also for pain management given worsening pain refractory to all treatments and resulting suicidality. Reviewed ED doc notes- he discussed with on-call neurology and NSGY and determined that patient could be discharged with outpatient follow-up. IV fentanyl was administered with relief to patient. She was discharged on fentanyl patch which has not provided relief. She is getting 70mcg- no side effects- discussed increasing to 29mcg and her husband is in agreement. Prior auth completed and approved. Discussed with husband starting patch on Satruday after she has worn 46mcg patch for 72 hours -encouraged follow-up with Dr. Joaquim Nam, husband let me know appointment has been set up 1/18.  -recommended follow-up with Dr. 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   -Discussed that turmeric  -discussed steroid taper. -continue savella, husband feels like it is helping with her suicidal thoughts. Increase to 25mg .  -continue hydrocodone.  -discuss mouth guard with dentist.  -d/c topamax -she does have another procedure scheduled. -discussed benefits of infrared light.  -Norco prescribed 5mg  TID PRN -follow-up monthly with Zella Ball and with me in February, placed on waitlist for earlier appointment with me, advised phone visits/MyChart are a great way to communicate in the interim -discussed that she can take an additional one or two tylenol for breakthrough pain -recommended checking CMP for liver enzymes with next labs -prescribed NAC 600mg  BID to protect the liver while taking tylenol, discussed its health benefits for mitochondrial function -d/c Baclofen  -f/u with Dr. Joaquim Nam and Dr. Davy Pique 1/18 -discussed retrying carbamazepine to see if it is effective for her. It is $89 for her, so we discussed trying Lamictal instead.  -encouraged anti-inflammatory diet.  -she had sinusitis that was viral and was treated with antibiotics. This was in the early 102s. The symptoms resolved after 2 months. She uses to get sinus infections and saline rinse.  -She has had multiple root canals.  -Discontinue 100% oxygen via facemask as this was not helpful.  -Discussed that unfortunately Sprint PNS is not indicated for craniofacial pain. -Amitriptyline and Lyrica did  not help.  -Continue Cymbalta 60mg .  -Topiramate helped but caused dizziness.  -Provided with hand out with information regarding trigeminal neuralgia -Retry the Phenytoin.  -She would like to try biofeedback. -reviewed neurology note.  -will try trigger point injections next visit.  -Continue Ketamine 10%, Baclofen 2%, Cyclobenzaprine 2%, Ketoprofen 10%, Gabapentin 6%, Bupivacaine 1%, Amitryptiline 5% to apply to painful areas- using 3-4 times per day.  -Can consider Meloxicam if Phenytoin does not  help.  -Discussed Qutenza as an option for neuropathic pain control. Discussed that this is a capsaicin patch, stronger than capsaicin cream. Discussed that it is currently approved for diabetic peripheral neuropathy and post-herpetic neuralgia, but that it has also shown benefit in treating other forms of neuropathy. Provided patient with link to site to learn more about the patch: [employer]. Discussed that the patch would be placed in office and benefits usually last 3 months. Discussed that unintended exposure to capsaicin can cause severe irritation of eyes, mucous membranes, respiratory tract, and skin, but that Qutenza is a local treatment and does not have the systemic side effects of other nerve medications. Discussed that there may be pain, itching, erythema, and decreased sensory function associated with the application of Qutenza. Side effects usually subside within 1 week. A cold pack of analgesic medications can help with these side effects. Blood pressure can also be increased due to pain associated with administration of the patch.  -she cut down on her gabapentin due to her dizziness    Turmeric to reduce inflammation--can be used in cooking or taken as a supplement.  Benefits of turmeric:  -Highly anti-inflammatory  -Increases antioxidants  -Improves memory, attention, brain disease  -Lowers risk of heart disease  -May help prevent cancer  -Decreases pain  -Alleviates depression  -Delays aging and decreases risk of chronic disease  -Consume with black pepper to increase absorption    Turmeric Milk Recipe:  1 cup milk  1 tsp turmeric  1 tsp cinnamon  1 tsp grated ginger (optional)  Black pepper (boosts the anti-inflammatory properties of turmeric).  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  -she is a good metabolizer of amitriptyline and has tolerated nortrypilline in the past, but without much benefit.    3) CKD: encouraged 608 glasses of water per day. She states that she had AKI with Ibuprofen in the past. Discussed checking Cr level next visit if we are considering Meloxicam -Discussed that Nucynta, and other pain medications, may stay in her bloodstream longer given her CKD   4) Hypernatremia: encouraged hydration   5) General health and pain: provided with list of healthy foods that are both nutritious and fight pain.   6) Depressed: Amitriptyline did help with this but she could not tolerate the drowsiness. Encouraged focusing on the factors that she can control, such as anti-inflammatory diet.   7) Fibromyalgia: Continue 30mg  of steroids per day for fibromyalgia. She is on that intermittently. She started this again  about a month ago. She has been on this about a year.  8) Dizziness: -discussed that this could be from the combination of gabapentin and baclofen.  -discussed that all the medications we are trying for pain may worsen the dizziness  9) Constipation:  -Provided list of following foods that help with constipation and highlighted a few: 1) prunes- contain high amounts of fiber.  2) apples- has a form of dietary fiber called pectin that accelerates stool movement and increases beneficial gut bacteria 3) pears- in addition to fiber, also high in fructose and sorbitol which have laxative effect 4) figs- contain an enzyme ficin which  helps to speed colonic transit 5) kiwis- contain an enzyme actinidin that improves gut motility and reduces constipation 6) oranges- rich in pectin (like apples) 7) grapefruits- contain a flavanol naringenin which has a laxative effect 8) vegetables- rich in fiber and also great sources of folate, vitamin C, and K 9) artichoke- high in inulin, prebiotic great for the microbiome 10) chicory- increases stool frequency and softness (can be added to coffee) 11) rhubarb- laxative effect 12) sweet potato- high fiber 13) beans, peas, and lentils- contain both soluble and insoluble fiber 14) chia seeds- improves intestinal health and gut flora 15) flaxseeds- laxative effect 16) whole grain rye bread- high in fiber 17) oat bran- high in soluble and insoluble fiber 18) kefir- softens stools -recommended to try at least one of these foods every day.  -drink 6-8 glasses of water per day -walk regularly, especially after meals.   10) Osteopenia - was tapered off the prednisone -discussed that currently her pain may be more of a risk to her than osteopenia given her suicidal ideation from her pain -discussed risk of fall with the medications that make her dizziness.

## 2021-07-09 DIAGNOSIS — E039 Hypothyroidism, unspecified: Secondary | ICD-10-CM | POA: Diagnosis not present

## 2021-07-09 DIAGNOSIS — G501 Atypical facial pain: Secondary | ICD-10-CM | POA: Diagnosis not present

## 2021-07-09 DIAGNOSIS — G500 Trigeminal neuralgia: Secondary | ICD-10-CM | POA: Diagnosis not present

## 2021-07-10 ENCOUNTER — Encounter: Payer: Self-pay | Admitting: Physical Medicine and Rehabilitation

## 2021-07-14 ENCOUNTER — Other Ambulatory Visit: Payer: Self-pay

## 2021-07-14 ENCOUNTER — Encounter (HOSPITAL_BASED_OUTPATIENT_CLINIC_OR_DEPARTMENT_OTHER): Payer: Medicare Other | Admitting: Physical Medicine and Rehabilitation

## 2021-07-14 DIAGNOSIS — G9689 Other specified disorders of central nervous system: Secondary | ICD-10-CM

## 2021-07-14 MED ORDER — FENTANYL 25 MCG/HR TD PT72: 1.0000 | MEDICATED_PATCH | TRANSDERMAL | 0 refills | Status: DC

## 2021-07-14 NOTE — Progress Notes (Signed)
Subjective:    Patient ID: [name], female    DOB: 08/18/1940, 81 y.o.   MRN: [id number]  HPI  An audio/video tele-health visit is felt to be the most appropriate encounter for this patient at this time. This is a follow up tele-visit via phone. The patient is at home. MD is at office. Prior to scheduling this appointment, our staff discussed the limitations of evaluation and management by telemedicine and the availability of in-person appointments. The patient expressed understanding and agreed to proceed.   [name] presents today for follow-up of severe trigeminal neuralgia.   1) Severe trigeminal neuralgia: -she is still not doing well.  -lamotrigine makes her too sleepy and dizzy to come down the stairs by herself and she does not feel comfortable going to the shower -left eye more open than usual. -she does not feel she is getting relief from the fentanyl patch -she feels that the procedure at [facility] is a last resort.  -Given darkening of skin and worsening eye lid closure, MRI brain stat obtained and shows new cyst that could be post-surgical. Recommended she go to ED given worsening dizziness and facial swelling as well. ED physician discussed case with NSGY and neurology and discharged home recommending follow-up with Dr. Gean Quint and Dr. Merceda Elks. She was given IV Fentanyl in the ED which helped and discharged on 49mcg Fentanyl patch, which has not helped.  -discussed increasing Fentanyl patch to 84mcg since she has not experienced any negative side effects, no respiratory depression, and her husband is agreeable. I have sent in new script to start Saturday, but insurance required prior auth, which was approved.  -tonight would be the third tay of the 48mcg Fentanyl patch -she is still taking the Gabapentin -she feels that when she lays down she is spinning.  -eye continues to appear more closed.  -Husband has called Dr. Dionne Ano and Dr. Earlie Raveling office to  scheduled follow-up appointments and has appointments with both tomorrow.  -Will run out of Fentanyl patch by Sunday -pain is worsening and nothing is providing relief -she asks whether she should go to ED -she has also noted dizziness and is not sure if this is from being in the MRI or from the [place].  -morphine was not effective and caused itching, sarna lotion did help wit this.  -hydrocodone has been most effective thus far -nucynta was not effective but she is not sure whether she gave it a fair trial -she has month appointments scheduled with Zella Ball and f/u with me in Feb -she used the Norco last night and this morning with good benefit and has started to feel the pain return this afternoon -she did find benefit from the compound cream- she continues to use this.  -She continues to have a sharp shooting pain doing around her eyelid and the tip teeth and in the top of her head.  -has a radiofrequency ablation treatment that helped but not completely -she is not having as much sharp knife-like pin like in her eye Her top teeth in her left law have been hurting really badly.  -she now takes the baclofen with the gabapentin and they seem to work well together.  -she had surgical procedure and feels worse -yesterday she took a half a tablet but this morning she took a whole one. She does fine with the hydrocodone but a whole one makes her too sleepy.  -she is taking 2 100mg  Gabapentin three times per day but it makes her  dizzy and sleepy so she cannot funciton -the cold worsens her pain.  -The IV fentanyl did help with the pain.  -her husband feels she should get one more dose of the 25mg  fentanyl. -she did get one week benefit from the steroid injection.  -she is doing terribly -she is miserable -she feels depressed due to the pain -she has considered taking the whole bottle of hydrocodone because because she can't take this pain anymore.  -she started taking the Gabapentin 300mg  TID.   -she does find relief from the hydrocodone -the pain is constant -she wonders if it is inflammation.  -she has not tried turmeric. Her son-in-law gave her a bottle and she couldn't stomach it.   2) Fibromyalgia: -She has been almost nonfunctional due to the severity of her pain. It continues to be very severe.  -She would like to wean off the Gabapentin as she feels at risk of falls -she is taking Topamax BID. She felt this was helpful but made her unsteady on her feet.   3) Osteoporosis: -husband asks whether she should get a Prolia shot.   4) Dizziness: -head was swimming -she felt like she was going to fall.  -worsening -feels like she is spinning, not the room  -She tried the Phenytoin for 2 days and did not feel benefit so stopped it.   -she feels the gabapentin aggravates this and she would love to wean off but knows it helps her.   -She feels very sleepy during the day.   -She has never tried Topirmate before.   5) Sore throat -developed stopping lamotrigine since not helping.   Prior history:  Her pain continues to be in her left eye, forehead. She has not tried any topicals for pain. She uses capsaicin for her back and knees.   Since last visit nothing we have tried has helped with her pain. She has tried to decrease her Gabapentin dose but then her pain increased so she has resumed taking 4 Gabapentin per day.   Her husband asks whether he should contact her surgeon regarding whether nerve may have become unclamped.   Discussed prolotherapy, trigger point injections, Baclofen, Clonazepam, Phenytoin as other options we could try,=.  Pain continues to be excruciating and she states she is not sure how much longer she can live like this.   Prior history: She has having some nausea, lightheadedness and felt this may be related to her Gabapentin. She did not take any yesterday and then felt more sever pain in the evening. She also gets electrical shooting across her  jaw.   She does not feel that she has cluster headaches as the pain has been so constant. She feels that she is getting worse. The oxygen made her feel relaxed but it is not helping.   The Sarna lotion does help with the itching in her head.   She was willing to try the Amitriptyline but it makes her too sleepy in the morning.   Her teeth and left eye incredibly hurt.   She feels that the Gabapentin has been making her dizziness.   She asks about biofeedback.   The pain is 7/10 on average, similar to last time.   When the pain is severe it can make her depressed.   Pain Inventory Average Pain 10 Pain Right Now 10 My pain is constant, sharp, burning, dull, stabbing, tingling, and aching  In the last 24 hours, has pain interfered with the following? General activity 8 Relation with  others 8 Enjoyment of life 8 What TIME of day is your pain at its worst? morning  Sleep (in general) Good  Pain is worse with: bending and standing Pain improves with: rest and heat & warm H2O over the head Relief from Meds: 5  Family History  Problem Relation Age of Onset   Heart disease Mother    Heart disease Father    Stroke Father    Stroke Sister    Asthma Daughter    Colon cancer Neg Hx    Social History   Socioeconomic History   Marital status: Married    Spouse name: Not on file   Number of children: 2   Years of education: college   Highest education level: Not on file  Occupational History   Occupation: housewife    Employer: RETIRED  Tobacco Use   Smoking status: Never   Smokeless tobacco: Never  Vaping Use   Vaping Use: Never used  Substance and Sexual Activity   Alcohol use: No    Alcohol/week: 0.0 standard drinks   Drug use: No   Sexual activity: Not on file  Other Topics Concern   Not on file  Social History Narrative   1 cup of Jasmine tea daily    Social Determinants of Health   Financial Resource Strain: Not on file  Food Insecurity: Not on file   Transportation Needs: Not on file  Physical Activity: Not on file  Stress: Not on file  Social Connections: Not on file   Past Surgical History:  Procedure Laterality Date   CHOLECYSTECTOMY  03/2010   COLONOSCOPY  2009   Dr. Raford Pitcher    CYSTOSCOPY     Multiple Cystoscopies with stents or biopsy    ESOPHAGOGASTRODUODENOSCOPY  10/2013   EYE SURGERY     bilateral cataract removal   Gamma knife radiation  01/2018   for trigeminal neuralgia   REFRACTIVE SURGERY Bilateral 2022   RHIZOTOMY Left 04/28/2021   Procedure: Left Percutaneous trigeminal balloon rhizotomy;  Surgeon: Judith Part, MD;  Location: [place];  Service: Neurosurgery;  Laterality: Left;   TONSILLECTOMY     Past Surgical History:  Procedure Laterality Date   CHOLECYSTECTOMY  03/2010   COLONOSCOPY  2009   Dr. Raford Pitcher    CYSTOSCOPY     Multiple Cystoscopies with stents or biopsy    ESOPHAGOGASTRODUODENOSCOPY  10/2013   EYE SURGERY     bilateral cataract removal   Gamma knife radiation  01/2018   for trigeminal neuralgia   REFRACTIVE SURGERY Bilateral 2022   RHIZOTOMY Left 04/28/2021   Procedure: Left Percutaneous trigeminal balloon rhizotomy;  Surgeon: Judith Part, MD;  Location: [place];  Service: Neurosurgery;  Laterality: Left;   TONSILLECTOMY     Past Medical History:  Diagnosis Date   Adenomatous colon polyp 03/24/2018   Allergy    Arthritis    Cataract    Chronic headaches    Depression    Fibromyalgia    Gallstones    GERD (gastroesophageal reflux disease)    Hyperlipemia    Hypertension    Interstitial cystitis    Lactose intolerance    LBBB (left bundle branch block)    Osteoarthritis    Osteopenia    Pneumonia    Thyroid disease    Tinnitus    Trigeminal neuralgia    There were no vitals taken for this visit.  Opioid Risk Score:   Fall Risk Score:  `1  Depression screen [facility] 2/9  Depression screen [facility] 2/9 07/08/2021 06/02/2021 05/06/2021 10/28/2020 06/26/2020  05/14/2020 02/14/2020  Decreased Interest 0 1 0 0 0 0 0  Down, Depressed, Hopeless 0 1 - 0 0 0 0  PHQ - 2 Score 0 2 0 0 0 0 0  Altered sleeping - - 0 - - - -  Tired, decreased energy - - - - - - -  Change in appetite - - - - - - -  Feeling bad or failure about yourself  - - - - - - -  Trouble concentrating - - - - - - -  Moving slowly or fidgety/restless - - - - - - -  Suicidal thoughts - - - - - - -  PHQ-9 Score - - 0 - - - -  Difficult doing work/chores - - - - - - -    Review of Systems  Constitutional: Negative.   HENT: Negative.    Eyes:  Positive for visual disturbance.  Respiratory: Negative.    Cardiovascular: Negative.   Gastrointestinal: Negative.   Endocrine: Negative.   Genitourinary: Negative.   Musculoskeletal: Negative.        Pain in left side of face  Skin: Negative.   Allergic/Immunologic: Negative.   Neurological:  Positive for dizziness, tremors, light-headedness and numbness.       Tingling   Hematological: Negative.   Psychiatric/Behavioral: Negative.    All other systems reviewed and are negative.     Objective:   Physical Exam Not performed as patient was seen via phone.     Assessment & Plan:  Mrs. Win is an 81 year old woman who presents for f/u of left sided trigeminal neuralgia and dizziness.    1) Sensory deafferation pain, left sided trigeminal neuralgia vs cluster headaches (she does have lacrimation and runny nose on that side).  She does have a history of migraines.  -discussed with Dr. Joaquim Nam trigeminal deafferation pain, discussed the difference between this and trigeminal neuralgia with patient and husband, discussed motor cortex stimulation. -discussed whether or not to continue fentanyl patch, husband does feel that the IV fentanyl  -encouraged following with Dr. Sabra Heck at [facility] to try procedure recommended by D. Ostegard.  -stop lamotrigine as it is making her dizzy without benefit -reviewed patient's medical  notes -recommended going to ED given darkening of skin, worsening eye closure, finding of cyst, dizziness to receive prompt neurosurgical eval regarding if surgery is necessary, and also for pain management given worsening pain refractory to all treatments and resulting suicidality. Reviewed ED doc notes- he discussed with on-call neurology and NSGY and determined that patient could be discharged with outpatient follow-up. IV fentanyl was administered with relief to patient. She was discharged on fentanyl patch which has not provided relief. She is getting 70mcg- no side effects- discussed increasing to 80mcg and her husband is in agreement. Prior auth completed and approved. Discussed with husband starting patch on Satruday after she has worn 99mcg patch for 72 hours -encouraged follow-up with Dr. Joaquim Nam, husband let me know appointment has been set up 1/18.  -recommended follow-up with Dr. 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  -Discussed that turmeric  -discussed steroid taper. -continue savella, husband feels like it is  helping with her suicidal thoughts. Increase to 25mg .  -continue hydrocodone.  -discuss mouth guard with dentist.  -d/c topamax -she does have another procedure scheduled. -discussed benefits of infrared light.  -Norco prescribed 5mg  TID PRN -follow-up monthly with Zella Ball and with me in February, placed on waitlist for earlier appointment with me, advised phone visits/MyChart are a great way to communicate in the interim -discussed that she can take an additional one or two tylenol for breakthrough pain -recommended checking CMP for liver enzymes with next  labs -prescribed NAC 600mg  BID to protect the liver while taking tylenol, discussed its health benefits for mitochondrial function -d/c Baclofen  -f/u with Dr. Joaquim Nam and Dr. Davy Pique 1/18 -discussed retrying carbamazepine to see if it is effective for her. It is $89 for her, so we discussed trying Lamictal instead.  -encouraged anti-inflammatory diet.  -she had sinusitis that was viral and was treated with antibiotics. This was in the early 22s. The symptoms resolved after 2 months. She uses to get sinus infections and saline rinse.  -She has had multiple root canals.  -Discontinue 100% oxygen via facemask as this was not helpful.  -Discussed that unfortunately Sprint PNS is not indicated for craniofacial pain. -Amitriptyline and Lyrica did not help.  -Continue Cymbalta 60mg .  -Topiramate helped but caused dizziness.  -Provided with hand out with information regarding trigeminal neuralgia -Retry the Phenytoin.  -She would like to try biofeedback. -reviewed neurology note.  -will try trigger point injections next visit.  -Continue Ketamine 10%, Baclofen 2%, Cyclobenzaprine 2%, Ketoprofen 10%, Gabapentin 6%, Bupivacaine 1%, Amitryptiline 5% to apply to painful areas- using 3-4 times per day.  -Can consider Meloxicam if Phenytoin does not help.  -Discussed Qutenza as an option for neuropathic pain control. Discussed that this is a capsaicin patch, stronger than capsaicin cream. Discussed that it is currently approved for diabetic peripheral neuropathy and post-herpetic neuralgia, but that it has also shown benefit in treating other forms of neuropathy. Provided patient with link to site to learn more about the patch: [employer]. Discussed that the patch would be placed in office and benefits usually last 3 months. Discussed that unintended exposure to capsaicin can cause severe irritation of eyes, mucous membranes, respiratory tract, and skin, but that Qutenza is a local treatment  and does not have the systemic side effects of other nerve medications. Discussed that there may be pain, itching, erythema, and decreased sensory function associated with the application of Qutenza. Side effects usually subside within 1 week. A cold pack of analgesic medications can help with these side effects. Blood pressure can also be increased due to pain associated with administration of the patch.  -she cut down on her gabapentin due to her dizziness    Turmeric to reduce inflammation--can be used in cooking or taken as a supplement.  Benefits of turmeric:  -Highly anti-inflammatory  -Increases antioxidants  -Improves memory, attention, brain disease  -Lowers risk of heart disease  -May help prevent cancer  -Decreases pain  -Alleviates depression  -Delays aging and decreases risk of chronic disease  -Consume with black pepper to increase absorption    Turmeric Milk Recipe:  1 cup milk  1 tsp turmeric  1 tsp cinnamon  1 tsp grated ginger (optional)  Black pepper (boosts the anti-inflammatory properties of turmeric).  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  -she is a good metabolizer of amitriptyline and has tolerated nortrypilline in the past, but without much benefit.    3) CKD: encouraged 608 glasses of water per day. She states that she had AKI with Ibuprofen in the past.  Discussed checking Cr level next visit if we are considering Meloxicam -Discussed that Nucynta, and other pain medications, may stay in her bloodstream longer given her CKD   4) Hypernatremia: encouraged hydration   5) General health and pain: provided with list of healthy foods that are both nutritious and fight pain.   6) Depressed: Amitriptyline did help with this but she could not tolerate the drowsiness. Encouraged focusing on the factors that she can control, such as anti-inflammatory diet.   7) Fibromyalgia: Continue 30mg  of steroids per day for fibromyalgia. She is on that intermittently. She started this again about a month ago. She has been on this about a year.  8) Dizziness: -discussed that this could be from the combination of gabapentin and baclofen.  -discussed that all the medications we are trying for pain may worsen the dizziness  9) Constipation:  -Provided list of following foods that help with constipation and highlighted a few: 1) prunes- contain high amounts of fiber.  2) apples- has a form of dietary fiber called pectin that accelerates stool movement and increases beneficial gut bacteria 3) pears- in addition to fiber, also high in fructose and sorbitol which have laxative effect 4) figs- contain an enzyme ficin which helps to speed colonic transit 5) kiwis- contain an enzyme actinidin that improves gut motility and reduces constipation 6) oranges- rich in pectin (like apples) 7) grapefruits- contain a flavanol naringenin which has a laxative effect 8) vegetables- rich in fiber and also great sources of folate, vitamin C, and K 9) artichoke- high in inulin, prebiotic great for the microbiome 10) chicory- increases stool frequency and softness (can be added to coffee) 11) rhubarb- laxative effect 12) sweet potato- high fiber 13) beans, peas, and lentils- contain both soluble and insoluble fiber 14) chia seeds- improves intestinal health and gut flora 15)  flaxseeds- laxative effect 16) whole grain rye bread- high in fiber 17) oat bran- high in soluble and insoluble fiber 18) kefir- softens stools -recommended to try at least one of these foods every day.  -drink 6-8 glasses of water per day -walk regularly, especially after meals.   10) Osteopenia - was tapered off the prednisone -discussed that currently her pain may be more of a risk to her than osteopenia given her suicidal ideation from her pain -discussed risk of fall with the medications that make her dizziness.    17 minutes spent in discussion of her current level of pain, lack of response to current medications, increased dizziness with lamotrigine, cost of fentanyl and family's preference to continue trial of this medication, review of Dr. Sheldon Silvan note, recommended discussing with Dr. Sabra Heck whether she is a candidate for motor cortex stimulation prior to traveling to [facility].

## 2021-07-16 DIAGNOSIS — R519 Headache, unspecified: Secondary | ICD-10-CM | POA: Diagnosis not present

## 2021-07-16 DIAGNOSIS — G500 Trigeminal neuralgia: Secondary | ICD-10-CM | POA: Diagnosis not present

## 2021-07-16 DIAGNOSIS — G501 Atypical facial pain: Secondary | ICD-10-CM | POA: Diagnosis not present

## 2021-07-17 DIAGNOSIS — E039 Hypothyroidism, unspecified: Secondary | ICD-10-CM | POA: Diagnosis not present

## 2021-07-22 ENCOUNTER — Other Ambulatory Visit: Payer: Self-pay | Admitting: Physical Medicine and Rehabilitation

## 2021-07-23 DIAGNOSIS — G501 Atypical facial pain: Secondary | ICD-10-CM | POA: Diagnosis not present

## 2021-07-25 ENCOUNTER — Other Ambulatory Visit: Payer: Self-pay | Admitting: Physical Medicine and Rehabilitation

## 2021-07-25 MED ORDER — FENTANYL 50 MCG/HR TD PT72: 1.0000 | MEDICATED_PATCH | TRANSDERMAL | 0 refills | Status: DC

## 2021-07-25 NOTE — Telephone Encounter (Signed)
Task completed

## 2021-07-28 ENCOUNTER — Other Ambulatory Visit: Payer: Self-pay | Admitting: Pulmonary Disease

## 2021-07-28 ENCOUNTER — Encounter: Payer: Self-pay | Admitting: Pulmonary Disease

## 2021-07-28 ENCOUNTER — Other Ambulatory Visit: Payer: Self-pay

## 2021-07-28 ENCOUNTER — Ambulatory Visit (INDEPENDENT_AMBULATORY_CARE_PROVIDER_SITE_OTHER): Payer: Medicare Other | Admitting: Pulmonary Disease

## 2021-07-28 VITALS — BP 110/70 | HR 79 | Temp 98.1°F | Ht 63.0 in | Wt 151.6 lb

## 2021-07-28 DIAGNOSIS — R942 Abnormal results of pulmonary function studies: Secondary | ICD-10-CM

## 2021-07-28 DIAGNOSIS — R0602 Shortness of breath: Secondary | ICD-10-CM | POA: Diagnosis not present

## 2021-07-28 LAB — PULMONARY FUNCTION TEST
DL/VA % pred: 68 %
DL/VA: 2.81 ml/min/mmHg/L
DLCO cor % pred: 65 %
DLCO cor: 11.81 ml/min/mmHg
DLCO unc % pred: 65 %
DLCO unc: 11.81 ml/min/mmHg
FEF 25-75 Post: 2.53 L/sec
FEF 25-75 Pre: 2.44 L/sec
FEF2575-%Change-Post: 3 %
FEF2575-%Pred-Post: 194 %
FEF2575-%Pred-Pre: 187 %
FEV1-%Change-Post: 1 %
FEV1-%Pred-Post: 123 %
FEV1-%Pred-Pre: 121 %
FEV1-Post: 2.23 L
FEV1-Pre: 2.2 L
FEV1FVC-%Change-Post: 1 %
FEV1FVC-%Pred-Pre: 114 %
FEV6-%Change-Post: 0 %
FEV6-%Pred-Post: 112 %
FEV6-%Pred-Pre: 113 %
FEV6-Post: 2.6 L
FEV6-Pre: 2.61 L
FEV6FVC-%Pred-Post: 106 %
FEV6FVC-%Pred-Pre: 106 %
FVC-%Change-Post: 0 %
FVC-%Pred-Post: 106 %
FVC-%Pred-Pre: 106 %
FVC-Post: 2.6 L
FVC-Pre: 2.61 L
Post FEV1/FVC ratio: 86 %
Post FEV6/FVC ratio: 100 %
Pre FEV1/FVC ratio: 84 %
Pre FEV6/FVC Ratio: 100 %
RV % pred: 76 %
RV: 1.8 L
TLC % pred: 90 %
TLC: 4.41 L

## 2021-07-28 NOTE — Progress Notes (Signed)
Synopsis: Referred in 06/2018 for Reduced DLCO  Subjective:   PATIENT ID: [name] GENDER: female DOB: [birth year], MRN: [id number]   HPI  Chief Complaint  Patient presents with   Follow-up    PFT results, nothing to report   Ms. [name] is a 81 year old female never smoker with fibromyalgia previously on 5 years low prednisone 2.5 mg, hx trigemina neuralgia s/p gamma knife who presents for follow-up.  Synopsis: Initially presented for consult in 06/2018 for gradually worsening dyspnea x 6 months that is aggravated with stairs and exercise. Previously played tennis in the spring and fall however since the pandemic no longer is active at baseline. PFTs in 07/2018 with moderately reduced gas exchange. Work-up included TTE with mild diastolic heart dysfunction and VQ scan negative for ventilation or perfusion defects. She was prescribed xoponex however did not tolerate due to tremors. She was lost to follow-up from 2020 until early 2023.  07/28/21 Since our last visit, she has not had any worsening shortness of breath. Occurs with walking up stairs. Has tried albuterol in the past that was ineffective. No longer play tennis or swims since COVID. Her husband is present and provides additional history. She has been in bed 18 hours a day on some days. She has been undergoing treatment for trigemina neuralgia that has been unsuccessful including chronic pain management. She has been referred to [facility].   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 Lived in [place] for 3 years, used coal heating  Allergies  Allergen Reactions   Dipyridamole Other (See Comments)    Migraines  Other reaction(s): migraine Persantine Other reaction(s): migraine   Diclofenac Sodium Other (See Comments)    Other reaction(s): hives Other reaction(s): hives   Hydrochlorothiazide Nausea Only    Chills Taking  at this time lower dose   Levothyroxine Sodium     Other reaction(s): Unknown Other reaction(s): Unknown   Naproxen Other (See Comments)    Other reaction(s): worsens interstitial cystitis Other reaction(s): worsens interstitial cystitis   Other Other (See Comments)    Cat and Dog Dander  Other reaction(s): bladder pain Other reaction(s): muscle cramps Other reaction(s): Unknown   Oxycodone Hcl Other (See Comments)   Pantoprazole Sodium Other (See Comments)    Other reaction(s): increased tremors Other reaction(s): increased tremors   Triamterene Other (See Comments)    Made Tremors worse Other reaction(s): Other (See Comments)   Buprenorphine Other (See Comments)    "knocks me out" Other reaction(s): Unknown Butrans Other reaction(s): Unknown   Codeine Nausea Only and Other (See Comments)    Made her feel loopy Other reaction(s): nausea   Doxycycline Rash and Other (See Comments)    Other reaction(s): rash Other reaction(s): rash   Esomeprazole Other (See Comments)    Jittery, sore throat Other reaction(s): jittery, sore throat Other reaction(s): jittery, sore throat   Hydrochlorothiazide W-Triamterene Other (See Comments)    Muscle Cramps  Other reaction(s): muscle cramps   Irbesartan Diarrhea    Other reaction(s): loose stools Other reaction(s): loose stools Other reaction(s): loose stools   Lactose Intolerance (Gi) Other (See Comments)    GI upset    Lactulose Other (See Comments)    GI upset   Losartan Other (See Comments)    Hot and chills Other reaction(s): hot and chills Other reaction(s): hot and chills Other reaction(s): hot and chills   Nortriptyline Hcl Other (See Comments)    "  My body doesn't metabolize it anymore." Other reaction(s): Unknown Other reaction(s): Unknown Other reaction(s): Unknown   Oxycodone Other (See Comments)    Interstitial cystitis Bladder pain Other reaction(s): bladder pain   Oxycontin [Oxycodone Hcl] Other (See  Comments)    Interstitial cystitis    Pennsaid [Diclofenac Sodium] Hives   Telmisartan Other (See Comments)    headache Other reaction(s): sinus pain, ha's Other reaction(s): sinus pain, ha's Other reaction(s): sinus pain, ha's   Tramadol Itching    sedation Other reaction(s): sedation/itching Other reaction(s): sedation/itching Other reaction(s): sedation/itching     Outpatient Medications Prior to Visit  Medication Sig Dispense Refill   Acetylcysteine (NAC) 600 MG CAPS Take 1 capsule (600 mg total) by mouth 2 (two) times daily. 60 capsule 0   benzocaine (ORAJEL) 10 % mucosal gel Use as directed 1 application in the mouth or throat as needed for mouth pain. 5.3 g 0   calcium carbonate (OS-CAL) 600 MG TABS tablet Take 600 mg by mouth daily with breakfast.     Capsaicin 0.1 % CREA Apply 1 application topically daily as needed (Back pain).     cetirizine (ZYRTEC) 10 MG tablet Take 10 mg by mouth daily.     Cholecalciferol (VITAMIN D-3) 25 MCG (1000 UT) CAPS Take 2,000 Units by mouth daily.     cloNIDine (CATAPRES - DOSED IN MG/24 HR) 0.1 mg/24hr patch Place 1 patch (0.1 mg total) onto the skin once a week. 4 patch 12   cloNIDine (CATAPRES) 0.1 MG tablet Take 1 tablet (0.1 mg total) by mouth 2 (two) times daily. 60 tablet 11   Cyanocobalamin (VITAMIN B-12) 2500 MCG SUBL Place 2,500 mcg under the tongue daily.     denosumab (PROLIA) 60 MG/ML SOSY injection Inject 60 mg into the skin every 6 (six) months.     DULoxetine (CYMBALTA) 30 MG capsule TAKE 1 CAPSULE TWICE A DAY (Patient taking differently: 60 mg daily.) 180 capsule 3   fentaNYL (DURAGESIC) 50 MCG/HR Place 1 patch onto the skin every 3 (three) days. 5 patch 0   fluticasone (FLONASE) 50 MCG/ACT nasal spray Place 1 spray into both nostrils daily.     Fluticasone Propionate,sensor, (ARMONAIR DIGIHALER) 55 MCG/ACT AEPB Inhale into the lungs.     gabapentin (NEURONTIN) 100 MG capsule Take 200 mg by mouth 3 (three) times daily.      gabapentin (NEURONTIN) 300 MG capsule Take 1 capsule (300 mg total) by mouth every 4 (four) hours as needed. 540 capsule 3   HYDROcodone-acetaminophen (NORCO/VICODIN) 5-325 MG tablet Take 1 tablet by mouth 4 (four) times daily as needed for moderate pain. Sig was changed on 06/02/2021 to increase Hydrocodone to 4 times a day as needed for pain. Please Fill On 06/11/2021. 120 tablet 0   lamoTRIgine (LAMICTAL) 150 MG tablet Take 1 tablet (150 mg total) by mouth daily. 60 tablet 3   levothyroxine (SYNTHROID) 75 MCG tablet Take 75 mcg by mouth daily before breakfast.     Milnacipran HCl (SAVELLA) 25 MG TABS Take 1 tablet (25 mg total) by mouth at bedtime. 25 tablet 3   NONFORMULARY OR COMPOUNDED ITEM Apply 1 Pump topically 4 (four) times daily as needed. Ketamine 10%, Baclofen 2%, Cyclobenzaprine 2%, Ketoprofen 10%, Gabapentin 6%, Bupivacaine 1%, Amitryptiline 5% Dispense 100mg  100 each 3   olmesartan (BENICAR) 40 MG tablet Take 40 mg by mouth daily.     ondansetron (ZOFRAN) 4 MG tablet Take 4 mg by mouth 2 (two) times daily as needed for nausea/vomiting.  pantoprazole (PROTONIX) 40 MG tablet Take 40 mg by mouth daily.     predniSONE (DELTASONE) 1 MG tablet 4tablet     predniSONE (DELTASONE) 10 MG tablet Take 1 tablet (10 mg total) by mouth daily with breakfast. 21 tablet 0   PREMARIN vaginal cream Place 1 applicator vaginally 3 (three) times a week.     Pyridoxine HCl (VITAMIN B-6 PO) Take 1 tablet by mouth daily.     No facility-administered medications prior to visit.    Review of Systems  Constitutional:  Positive for malaise/fatigue. Negative for chills, diaphoresis, fever and weight loss.  HENT:  Negative for congestion.   Respiratory:  Positive for shortness of breath. Negative for cough, hemoptysis, sputum production and wheezing.   Cardiovascular:  Negative for chest pain, palpitations and leg swelling.    Objective:   Vitals:   07/28/21 1334  BP: 110/70  Pulse: 79  Temp: 98.1  F (36.7 C)  TempSrc: Oral  SpO2: 96%  Weight: 151 lb 9.6 oz (68.8 kg)  Height: 5\' 3"  (1.6 m)   SpO2: 96 % O2 Device: None (Room air)  Physical Exam: General: Elderly and frail-appearing, no acute distress HENT: [place], AT Eyes: EOMI, no scleral icterus Respiratory: Clear to auscultation bilaterally.  No crackles, wheezing or rales Cardiovascular: RRR, -M/R/G, no JVD Extremities:-Edema,-tenderness Neuro: AAO x4, CNII-XII grossly intact Psych: Normal mood, normal affect  Data Imaging CXR 07/15/18 - no infiltrate, edema or effusion. VQ scan 07/15/18 - no V/Q mismatch  PFT 07/04/18 FVC 2.77 (107%) FEV1 2.27 (118%) Ratio 82  TLC 96% DLCO 61% Interpretation: Normal spirometry. Mildly reduced DLCO  07/28/21 FVC 2.6 (106%) FEV1 2.23 (123%) Ratio 84 TLC 90% DLCO 65% Interpretation: No obstructive and restrictive defect. Mildly reduced DLCO  TTE 07/05/18 Normal EF 55-60%. Mild diastolic dysfunction, mild TR, mild PH  Labs CBC 07/01/18 reviewed. Hg appropriate. 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 We reviewed PFTs which is unchanged with mildly reduced DLCO. Reviewed patient's note and history since 2020. 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 She may have some organic etiology with underlying ILD contributing to this but I believe she needs to start re-engaging in regular aerobic activity. Counseled on activity or approach to restarting daily exercise. No benefit from inhalers at this point as she is unable to tolerate beta-agonists due to tremors.  Dyspnea on exertion Reduced DLCO --Encourage regular aerobic activity to improve  endurance and stamina --ORDER CT Chest High Resolution --REFER to Pulmonary Rehab  Orders Placed This Encounter  Procedures   CT Chest High Resolution    Standing Status:   Future    Standing Expiration Date:   07/28/2022    Order Specific Question:   Preferred imaging location?    Answer:   [id number]   AMB referral to pulmonary rehabilitation    Referral Priority:   Routine    Referral Type:   Consultation    Number of Visits Requested:   1   No orders of the defined types were placed in this encounter.  Return in about 1 month (around 08/25/2021).   I have spent a total time of 45-minutes on the day of the appointment  reviewing prior documentation, coordinating care and discussing medical diagnosis and plan with the patient/family. Past medical history, allergies, medications were reviewed. Pertinent imaging, labs and tests included in this note have been reviewed and interpreted independently by me.  Syrus Nakama Rodman Pickle, MD [facility] 07/28/2021 1:39 PM  Personal pager: [phone] If unanswered, please page CCM On-call: [phone]

## 2021-07-28 NOTE — Progress Notes (Signed)
PFT done today. 

## 2021-07-28 NOTE — Patient Instructions (Addendum)
Dyspnea on exertion Reduced DLCO --Encourage regular aerobic activity to improve endurance and stamina --ORDER CT Chest High Resolution --REFER to Pulmonary Rehab  Follow-up with me in one month with CT prior to visit

## 2021-07-31 ENCOUNTER — Encounter: Payer: Self-pay | Admitting: Physical Medicine and Rehabilitation

## 2021-07-31 ENCOUNTER — Other Ambulatory Visit: Payer: Self-pay

## 2021-07-31 ENCOUNTER — Telehealth (HOSPITAL_COMMUNITY): Payer: Self-pay

## 2021-07-31 ENCOUNTER — Encounter
Payer: Medicare Other | Attending: Physical Medicine and Rehabilitation | Admitting: Physical Medicine and Rehabilitation

## 2021-07-31 VITALS — BP 142/84 | HR 71 | Ht 63.0 in | Wt 152.0 lb

## 2021-07-31 DIAGNOSIS — M797 Fibromyalgia: Secondary | ICD-10-CM | POA: Insufficient documentation

## 2021-07-31 DIAGNOSIS — G9689 Other specified disorders of central nervous system: Secondary | ICD-10-CM | POA: Insufficient documentation

## 2021-07-31 NOTE — Telephone Encounter (Signed)
Called patient to see if she is interested in the Pulmonary Rehab Program. Patient expressed interest. Explained scheduling process and went over insurance, patient verbalized understanding. Someone from our pulmonary rehab staff will contact pt at a later time. 

## 2021-07-31 NOTE — Telephone Encounter (Signed)
Pt insurance is active and benefits verified through Medicare a/b Co-pay 0, DED $226/$226 met, out of pocket 0/0 met, co-insurance 20%. no pre-authorization required.   2ndary insurance is active and benefits verified through [employer]. Co-pay 0, DED 0/0 met, out of pocket 0/0 met, co-insurance 0%. No pre-authorization required.    Will contact patient to see if she is interested in the Cardiac Rehab Program. If interested, patient will need to complete follow up appt. Once completed, patient will be contacted for scheduling upon review by the RN Navigator.

## 2021-07-31 NOTE — Patient Instructions (Signed)
Recommend topical CBD oil- discussed its benefits in reducing inflammation, pain, insomnia, and anxiety.  -Discussed that CBD oil differs from marijuana in that it does not contain THC- the substance that causes euphoria.  -Discussed that it is made from the hemp plant.  -It has been used for thousands of years -preliminary research suggests that if may be able to shrink cancerous tumors, stop plaque formation in Alzheimer's Disease, and slow the progress of brain disease from concussions.  -Additional benefits that have been demonstrated in studies include improved nausea, indigestion, and brain health, and reduced seizures.  -In a survey 92% of patients who tried medical cannabis felt it improved symptoms such as chronic pain, arthritis, migraines, and cancer.

## 2021-07-31 NOTE — Progress Notes (Signed)
Subjective:    Patient ID: [name], female    DOB: 1941/01/21, 81 y.o.   MRN: [id number]  HPI  [name] presents today for follow-up of severe trigeminal neuralgia.   1) Severe trigeminal neuralgia: -she is still not doing well.  -the fentanyl patch makes her very sleepy and so allows her to sleep. It has also made her dizzier.  -the 33mcg fentanyl patch did not provide more relief -she is using the compound cream and this helps- she uses this twice per day -capsaicin made it burn  -lamotrigine makes her too sleepy and dizzy to come down the stairs by herself and she does not feel comfortable going to the shower -left eye more open than usual. -she does not feel she is getting relief from the fentanyl patch -she feels that the procedure at [facility] is a last resort.  -Given darkening of skin and worsening eye lid closure, MRI brain stat obtained and shows new cyst that could be post-surgical. Recommended she go to ED given worsening dizziness and facial swelling as well. ED physician discussed case with NSGY and neurology and discharged home recommending follow-up with Dr. Gean Quint and Dr. Merceda Elks. She was given IV Fentanyl in the ED which helped and discharged on 64mcg Fentanyl patch, which has not helped.  -discussed increasing Fentanyl patch to 25mcg since she has not experienced any negative side effects, no respiratory depression, and her husband is agreeable. I have sent in new script to start Saturday, but insurance required prior auth, which was approved.  -tonight would be the third tay of the 58mcg Fentanyl patch -she is still taking the Gabapentin -she feels that when she lays down she is spinning.  -eye continues to appear more closed.  -Husband has called Dr. Dionne Ano and Dr. Earlie Raveling office to scheduled follow-up appointments and has appointments with both tomorrow.  -Will run out of Fentanyl patch by Sunday -pain is worsening and nothing is providing  relief -she asks whether she should go to ED -she has also noted dizziness and is not sure if this is from being in the MRI or from the [place].  -morphine was not effective and caused itching, sarna lotion did help wit this.  -hydrocodone has been most effective thus far -nucynta was not effective but she is not sure whether she gave it a fair trial -she has month appointments scheduled with Zella Ball and f/u with me in Feb -she used the Norco last night and this morning with good benefit and has started to feel the pain return this afternoon -she did find benefit from the compound cream- she continues to use this.  -She continues to have a sharp shooting pain doing around her eyelid and the tip teeth and in the top of her head.  -has a radiofrequency ablation treatment that helped but not completely -she is not having as much sharp knife-like pin like in her eye Her top teeth in her left law have been hurting really badly.  -she now takes the baclofen with the gabapentin and they seem to work well together.  -she had surgical procedure and feels worse -yesterday she took a half a tablet but this morning she took a whole one. She does fine with the hydrocodone but a whole one makes her too sleepy.  -she is taking 2 100mg  Gabapentin three times per day but it makes her dizzy and sleepy so she cannot funciton -the cold worsens her pain.  -The IV fentanyl did help  with the pain.  -her husband feels she should get one more dose of the 25mg  fentanyl. -she did get one week benefit from the steroid injection.  -she is doing terribly -she is miserable -she feels depressed due to the pain -she has considered taking the whole bottle of hydrocodone because because she can't take this pain anymore.  -she started taking the Gabapentin 300mg  TID.  -she does find relief from the hydrocodone -the pain is constant -she wonders if it is inflammation.  -she has not tried turmeric. Her son-in-law gave her a  bottle and she couldn't stomach it.   2) Fibromyalgia: -She has been almost nonfunctional due to the severity of her pain. It continues to be very severe.  -She would like to wean off the Gabapentin as she feels at risk of falls -she is taking Topamax BID. She felt this was helpful but made her unsteady on her feet.   3) Osteoporosis: -husband asks whether she should get a Prolia shot.   4) Dizziness: -head was swimming -she felt like she was going to fall.  -worsening -feels like she is spinning, not the room  -She tried the Phenytoin for 2 days and did not feel benefit so stopped it.   -she feels the gabapentin aggravates this and she would love to wean off but knows it helps her.   -She feels very sleepy during the day.   -She has never tried Topirmate before.   5) Sore throat -developed stopping lamotrigine since not helping.   Prior history:  Her pain continues to be in her left eye, forehead. She has not tried any topicals for pain. She uses capsaicin for her back and knees.   Since last visit nothing we have tried has helped with her pain. She has tried to decrease her Gabapentin dose but then her pain increased so she has resumed taking 4 Gabapentin per day.   Her husband asks whether he should contact her surgeon regarding whether nerve may have become unclamped.   Discussed prolotherapy, trigger point injections, Baclofen, Clonazepam, Phenytoin as other options we could try,=.  Pain continues to be excruciating and she states she is not sure how much longer she can live like this.   Prior history: She has having some nausea, lightheadedness and felt this may be related to her Gabapentin. She did not take any yesterday and then felt more sever pain in the evening. She also gets electrical shooting across her jaw.   She does not feel that she has cluster headaches as the pain has been so constant. She feels that she is getting worse. The oxygen made her feel relaxed  but it is not helping.   The Sarna lotion does help with the itching in her head.   She was willing to try the Amitriptyline but it makes her too sleepy in the morning.   Her teeth and left eye incredibly hurt.   She feels that the Gabapentin has been making her dizziness.   She asks about biofeedback.   The pain is 7/10 on average, similar to last time.   When the pain is severe it can make her depressed.   Pain Inventory Pain Right Now 10 My pain is constant, sharp, burning, dull, stabbing, tingling, and aching  In the last 24 hours, has pain interfered with the following? General activity 10 Relation with others 7 Enjoyment of life 8 What TIME of day is your pain at its worst? varies Sleep (in general) Good  Pain is worse with: bending Pain improves with: rest and medication Relief from Meds: unknown  Family History  Problem Relation Age of Onset   Heart disease Mother    Heart disease Father    Stroke Father    Stroke Sister    Asthma Daughter    Colon cancer Neg Hx    Social History   Socioeconomic History   Marital status: Married    Spouse name: Not on file   Number of children: 2   Years of education: college   Highest education level: Not on file  Occupational History   Occupation: housewife    Employer: RETIRED  Tobacco Use   Smoking status: Never   Smokeless tobacco: Never  Vaping Use   Vaping Use: Never used  Substance and Sexual Activity   Alcohol use: No    Alcohol/week: 0.0 standard drinks   Drug use: No   Sexual activity: Not on file  Other Topics Concern   Not on file  Social History Narrative   1 cup of Jasmine tea daily    Social Determinants of Health   Financial Resource Strain: Not on file  Food Insecurity: Not on file  Transportation Needs: Not on file  Physical Activity: Not on file  Stress: Not on file  Social Connections: Not on file   Past Surgical History:  Procedure Laterality Date   CHOLECYSTECTOMY  03/2010    COLONOSCOPY  2009   Dr. Raford Pitcher    CYSTOSCOPY     Multiple Cystoscopies with stents or biopsy    ESOPHAGOGASTRODUODENOSCOPY  10/2013   EYE SURGERY     bilateral cataract removal   Gamma knife radiation  01/2018   for trigeminal neuralgia   REFRACTIVE SURGERY Bilateral 2022   RHIZOTOMY Left 04/28/2021   Procedure: Left Percutaneous trigeminal balloon rhizotomy;  Surgeon: Judith Part, MD;  Location: [place];  Service: Neurosurgery;  Laterality: Left;   TONSILLECTOMY     Past Surgical History:  Procedure Laterality Date   CHOLECYSTECTOMY  03/2010   COLONOSCOPY  2009   Dr. Raford Pitcher    CYSTOSCOPY     Multiple Cystoscopies with stents or biopsy    ESOPHAGOGASTRODUODENOSCOPY  10/2013   EYE SURGERY     bilateral cataract removal   Gamma knife radiation  01/2018   for trigeminal neuralgia   REFRACTIVE SURGERY Bilateral 2022   RHIZOTOMY Left 04/28/2021   Procedure: Left Percutaneous trigeminal balloon rhizotomy;  Surgeon: Judith Part, MD;  Location: [place];  Service: Neurosurgery;  Laterality: Left;   TONSILLECTOMY     Past Medical History:  Diagnosis Date   Adenomatous colon polyp 03/24/2018   Allergy    Arthritis    Cataract    Chronic headaches    Depression    Fibromyalgia    Gallstones    GERD (gastroesophageal reflux disease)    Hyperlipemia    Hypertension    Interstitial cystitis    Lactose intolerance    LBBB (left bundle branch block)    Osteoarthritis    Osteopenia    Pneumonia    Thyroid disease    Tinnitus    Trigeminal neuralgia    There were no vitals taken for this visit.  Opioid Risk Score:   Fall Risk Score:  `1  Depression screen PHQ 2/9  Depression screen [facility] 2/9 07/08/2021 06/02/2021 05/06/2021 10/28/2020 06/26/2020 05/14/2020 02/14/2020  Decreased Interest 0 1 0 0 0 0 0  Down, Depressed, Hopeless 0 1 - 0 0 0  0  PHQ - 2 Score 0 2 0 0 0 0 0  Altered sleeping - - 0 - - - -  Tired, decreased energy - - - - - - -  Change in  appetite - - - - - - -  Feeling bad or failure about yourself  - - - - - - -  Trouble concentrating - - - - - - -  Moving slowly or fidgety/restless - - - - - - -  Suicidal thoughts - - - - - - -  PHQ-9 Score - - 0 - - - -  Difficult doing work/chores - - - - - - -    Review of Systems  Constitutional: Negative.   HENT: Negative.    Eyes:  Positive for visual disturbance.  Respiratory: Negative.    Cardiovascular: Negative.   Gastrointestinal: Negative.   Endocrine: Negative.   Genitourinary: Negative.   Musculoskeletal: Negative.        Pain in left side of face  Skin: Negative.   Allergic/Immunologic: Negative.   Neurological:  Positive for dizziness, tremors, light-headedness and numbness.       Tingling   Hematological: Negative.   Psychiatric/Behavioral: Negative.    All other systems reviewed and are negative.     Objective:   Physical Exam Gen: no distress, normal appearing HEENT: oral mucosa pink and moist, NCAT Cardio: Reg rate Chest: normal effort, normal rate of breathing Abd: soft, non-distended Ext: no edema Psych: pleasant, normal affect Skin: intact Neuro: Alert and oriented x3. Left eye droop.     Assessment & Plan:  [name] is an 81 year old woman who presents for f/u of left sided trigeminal neuralgia and dizziness.    1) Sensory deafferation pain, left sided trigeminal neuralgia vs cluster headaches (she does have lacrimation and runny nose on that side).  She does have a history of migraines.  -decrease fentanyl patch to 36mcg  -discussed CBD oil Recommend topical CBD oil- discussed its benefits in reducing inflammation, pain, insomnia, and anxiety.  -Discussed that CBD oil differs from marijuana in that it does not contain THC- the substance that causes euphoria.  -Discussed that it is made from the hemp plant.  -It has been used for thousands of years -preliminary research suggests that if may be able to shrink cancerous tumors, stop  plaque formation in Alzheimer's Disease, and slow the progress of brain disease from concussions.  -Additional benefits that have been demonstrated in studies include improved nausea, indigestion, and brain health, and reduced seizures.  -In a survey 92% of patients who tried medical cannabis felt it improved symptoms such as chronic pain, arthritis, migraines, and cancer.   -Provided with a pain relief journal and discussed that it contains foods and lifestyle tips to naturally help to improve pain. Discussed that these lifestyle strategies are also very good for health unlike some medications which can have negative side effects. Discussed that the act of keeping a journal can be therapeutic and helpful to realize patterns what helps to trigger and alleviate pain.   -discussed with Dr. Joaquim Nam trigeminal deafferation pain, discussed the difference between this and trigeminal neuralgia with patient and husband, discussed motor cortex stimulation. -discussed whether or not to continue fentanyl patch, husband does feel that the IV fentanyl  -encouraged following with Dr. Sweet/Dr. Sabra Heck at [facility] to try procedure recommended by D. Ostegard.  -stop lamotrigine as it is making her dizzy without benefit -reviewed patient's medical notes -discussed her progress  with getting a referral to [facility] -recommended going to ED given darkening of skin, worsening eye closure, finding of cyst, dizziness to receive prompt neurosurgical eval regarding if surgery is necessary, and also for pain management given worsening pain refractory to all treatments and resulting suicidality. Reviewed ED doc notes- he discussed with on-call neurology and NSGY and determined that patient could be discharged with outpatient follow-up. IV fentanyl was administered with relief to patient. She was discharged on fentanyl patch which has not provided relief. She is getting 56mcg- no side effects- discussed increasing to 54mcg and  her husband is in agreement. Prior auth completed and approved. Discussed with husband starting patch on Satruday after she has worn 7mcg patch for 72 hours -encouraged follow-up with Dr. Joaquim Nam, husband let me know appointment has been set up 1/18.  -recommended follow-up with Dr. 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  -Discussed that turmeric  -discussed steroid taper. -continue savella, husband feels like it is helping with her suicidal thoughts. Increase to 25mg .  -continue hydrocodone.  -discuss mouth guard with dentist.  -d/c topamax -she does have another procedure scheduled. -discussed benefits of infrared light.  -Norco prescribed 5mg  TID PRN -follow-up monthly with Zella Ball and with me in February, placed on waitlist for earlier appointment with me, advised phone visits/MyChart are a great way to communicate in the interim -discussed that she can take an additional one or two tylenol for breakthrough pain -recommended checking CMP for liver enzymes with next labs -prescribed NAC 600mg  BID to protect the liver while taking tylenol, discussed its health benefits for mitochondrial function -d/c Baclofen  -f/u with Dr. Joaquim Nam and Dr. Davy Pique 1/18 -discussed retrying carbamazepine to see if it is effective for her. It is $89 for her, so we discussed trying Lamictal instead.  -encouraged anti-inflammatory diet.  -she had sinusitis that was viral and was treated with antibiotics. This was in the early 25s. The symptoms resolved after 2 months. She uses to get sinus infections and saline rinse.  -She has had multiple root canals.  -Discontinue 100% oxygen via facemask as  this was not helpful.  -Discussed that unfortunately [employer] PNS is not indicated for craniofacial pain. -Amitriptyline and Lyrica did not help.  -Continue Cymbalta 60mg .  -Topiramate helped but caused dizziness.  -Provided with hand out with information regarding trigeminal neuralgia -Failed Penytoin.  -trial lidocaine patch -She would like to try biofeedback. -reviewed neurology note.  -referred to Dr. Juel Burrow.  -will try trigger point injections next visit.  -Continue Ketamine 10%, Baclofen 2%, Cyclobenzaprine 2%, Ketoprofen 10%, Gabapentin 6%, Bupivacaine 1%, Amitryptiline 5% to apply to painful areas- using 3-4 times per day.  -Can consider Meloxicam if Phenytoin does not help.  -Discussed Qutenza as an option for neuropathic pain control. Discussed that this is a capsaicin patch, stronger than capsaicin cream. Discussed that it is currently approved for diabetic peripheral neuropathy and post-herpetic neuralgia, but that it has also shown benefit in treating other forms of neuropathy. Provided patient with link to site to learn more about the patch: [employer].fr. Discussed that the patch would be placed in office and benefits usually last 3 months. Discussed that unintended exposure to capsaicin can cause severe irritation of eyes, mucous  membranes, respiratory tract, and skin, but that Qutenza is a local treatment and does not have the systemic side effects of other nerve medications. Discussed that there may be pain, itching, erythema, and decreased sensory function associated with the application of Qutenza. Side effects usually subside within 1 week. A cold pack of analgesic medications can help with these side effects. Blood pressure can also be increased due to pain associated with administration of the patch.  -she cut down on her gabapentin due to her dizziness    Turmeric to reduce inflammation--can be used in cooking or taken as a supplement.  Benefits of  turmeric:  -Highly anti-inflammatory  -Increases antioxidants  -Improves memory, attention, brain disease  -Lowers risk of heart disease  -May help prevent cancer  -Decreases pain  -Alleviates depression  -Delays aging and decreases risk of chronic disease  -Consume with black pepper to increase absorption    Turmeric Milk Recipe:  1 cup milk  1 tsp turmeric  1 tsp cinnamon  1 tsp grated ginger (optional)  Black pepper (boosts the anti-inflammatory properties of turmeric).  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  -she is a good metabolizer of amitriptyline and has tolerated nortrypilline in the past, but without much benefit.    3) CKD: encouraged 6-8 glasses of water per day. She states that she had AKI with Ibuprofen in the past. Discussed checking Cr level next visit if we are considering Meloxicam -Discussed that Nucynta, and other pain medications, may stay in her bloodstream longer given her CKD   4) Hypernatremia: encouraged hydration   5) General health and pain: provided with list of healthy foods that are both nutritious and fight pain.   6) Depressed: Amitriptyline did help with this but she could not tolerate the drowsiness. Encouraged focusing on the factors that she can control, such as anti-inflammatory diet.   7) Fibromyalgia: Continue 30mg  of steroids per day for  fibromyalgia. She is on that intermittently. She started this again about a month ago. She has been on this about a year. Continue Savella  8) Dizziness: -discussed that this could be from the combination of gabapentin and baclofen.  -discussed that all the medications we are trying for pain may worsen the dizziness  9) Constipation:  -Provided list of following foods that help with constipation and highlighted a few: 1) prunes- contain high amounts of fiber.  2) apples- has a form of dietary fiber called pectin that accelerates stool movement and increases beneficial gut bacteria 3) pears- in addition to fiber, also high in fructose and sorbitol which have laxative effect 4) figs- contain an enzyme ficin which helps to speed colonic transit 5) kiwis- contain an enzyme actinidin that improves gut motility and reduces constipation 6) oranges- rich in pectin (like apples) 7) grapefruits- contain a flavanol naringenin which has a laxative effect 8) vegetables- rich in fiber and also great sources of folate, vitamin C, and K 9) artichoke- high in  inulin, prebiotic great for the microbiome 10) chicory- increases stool frequency and softness (can be added to coffee) 11) rhubarb- laxative effect 12) sweet potato- high fiber 13) beans, peas, and lentils- contain both soluble and insoluble fiber 14) chia seeds- improves intestinal health and gut flora 15) flaxseeds- laxative effect 16) whole grain rye bread- high in fiber 17) oat bran- high in soluble and insoluble fiber 18) kefir- softens stools -recommended to try at least one of these foods every day.  -drink 6-8 glasses of water per day -walk regularly, especially after meals.   10) Osteopenia - was tapered off the prednisone -discussed that currently her pain may be more of a risk to her than osteopenia given her suicidal ideation from her pain -discussed risk of fall with the medications that make her dizziness.

## 2021-08-04 ENCOUNTER — Encounter (HOSPITAL_COMMUNITY): Payer: Self-pay | Admitting: *Deleted

## 2021-08-04 DIAGNOSIS — K120 Recurrent oral aphthae: Secondary | ICD-10-CM | POA: Diagnosis not present

## 2021-08-04 DIAGNOSIS — F32A Depression, unspecified: Secondary | ICD-10-CM | POA: Diagnosis not present

## 2021-08-04 NOTE — Progress Notes (Signed)
Received referral from Dr. Loanne Drilling  for this pt to participate in pulmonary rehab with the diagnosis of diffusion of the lung capacity decreased/abnormal pulmonary function test. Clinical review of pt follow up appt on 07/28/21 Pulmonary office note.  Also reviewed progress notes with physical medicine and neurosurgery. Pt with Covid Risk Score - 3. Pt appropriate for scheduling for Pulmonary rehab.  Will forward to support staff for scheduling when able as there is a wait list. Maurice Small RN, BSN Cardiac and Pulmonary Rehab Nurse Navigator

## 2021-08-06 ENCOUNTER — Other Ambulatory Visit: Payer: Self-pay | Admitting: Physical Medicine and Rehabilitation

## 2021-08-06 MED ORDER — NALOXONE HCL 0.4 MG/ML IJ SOLN: INTRAMUSCULAR | 3 refills | Status: DC

## 2021-08-07 ENCOUNTER — Encounter (HOSPITAL_BASED_OUTPATIENT_CLINIC_OR_DEPARTMENT_OTHER): Payer: Medicare Other | Admitting: Physical Medicine and Rehabilitation

## 2021-08-07 ENCOUNTER — Other Ambulatory Visit: Payer: Self-pay

## 2021-08-07 DIAGNOSIS — G9689 Other specified disorders of central nervous system: Secondary | ICD-10-CM

## 2021-08-08 NOTE — Progress Notes (Signed)
Subjective:    Patient ID: [name], female    DOB: 11/29/1940, 82 y.o.   MRN: [id number]  HPI  An audio/video tele-health visit is felt to be the most appropriate encounter for this patient at this time. This is a follow up tele-visit via phone. The patient is at home. MD is at office. Prior to scheduling this appointment, our staff discussed the limitations of evaluation and management by telemedicine and the availability of in-person appointments. The patient expressed understanding and agreed to proceed.   [name] presents today for follow-up of severe trigeminal neuralgia.   1) Severe trigeminal neuralgia: -she is still not doing well.  -the fentanyl patch makes her very sleepy and so allows her to sleep. It has also made her dizzier.  -the 36mcg fentanyl patch did not provide more relief- she would like to wean off -referral was to [facility] and not to Dr. Juel Burrow -she is using the compound cream and this helps- she uses this twice per day -capsaicin made it burn  -lamotrigine makes her too sleepy and dizzy to come down the stairs by herself and she does not feel comfortable going to the shower -left eye more open than usual. -she does not feel she is getting relief from the fentanyl patch -she feels that the procedure at [facility] is a last resort.  -Given darkening of skin and worsening eye lid closure, MRI brain stat obtained and shows new cyst that could be post-surgical. Recommended she go to ED given worsening dizziness and facial swelling as well. ED physician discussed case with NSGY and neurology and discharged home recommending follow-up with Dr. Gean Quint and Dr. Merceda Elks. She was given IV Fentanyl in the ED which helped and discharged on 45mcg Fentanyl patch, which has not helped.  -discussed increasing Fentanyl patch to 28mcg since she has not experienced any negative side effects, no respiratory depression, and her husband is agreeable. I have  sent in new script to start Saturday, but insurance required prior auth, which was approved.  -tonight would be the third tay of the 86mcg Fentanyl patch -she is still taking the Gabapentin -she feels that when she lays down she is spinning.  -eye continues to appear more closed.  -Husband has called Dr. Dionne Ano and Dr. Earlie Raveling office to scheduled follow-up appointments and has appointments with both tomorrow.  -Will run out of Fentanyl patch by Sunday -pain is worsening and nothing is providing relief -she asks whether she should go to ED -she has also noted dizziness and is not sure if this is from being in the MRI or from the [place].  -morphine was not effective and caused itching, sarna lotion did help wit this.  -hydrocodone has been most effective thus far -nucynta was not effective but she is not sure whether she gave it a fair trial -she has month appointments scheduled with Zella Ball and f/u with me in Feb -she used the Norco last night and this morning with good benefit and has started to feel the pain return this afternoon -she did find benefit from the compound cream- she continues to use this.  -She continues to have a sharp shooting pain doing around her eyelid and the tip teeth and in the top of her head.  -has a radiofrequency ablation treatment that helped but not completely -she is not having as much sharp knife-like pin like in her eye Her top teeth in her left law have been hurting really badly.  -she  now takes the baclofen with the gabapentin and they seem to work well together.  -she had surgical procedure and feels worse -yesterday she took a half a tablet but this morning she took a whole one. She does fine with the hydrocodone but a whole one makes her too sleepy.  -she is taking 2 100mg  Gabapentin three times per day but it makes her dizzy and sleepy so she cannot funciton -the cold worsens her pain.  -The IV fentanyl did help with the pain.  -her husband feels  she should get one more dose of the 25mg  fentanyl. -she did get one week benefit from the steroid injection.  -she is doing terribly -she is miserable -she feels depressed due to the pain -she has considered taking the whole bottle of hydrocodone because because she can't take this pain anymore.  -she started taking the Gabapentin 300mg  TID.  -she does find relief from the hydrocodone -the pain is constant -she wonders if it is inflammation.  -she has not tried turmeric. Her son-in-law gave her a bottle and she couldn't stomach it.   2) Fibromyalgia: -She has been almost nonfunctional due to the severity of her pain. It continues to be very severe.  -She would like to wean off the Gabapentin as she feels at risk of falls -she is taking Topamax BID. She felt this was helpful but made her unsteady on her feet.   3) Osteoporosis: -husband asks whether she should get a Prolia shot.   4) Dizziness: -head was swimming -she felt like she was going to fall.  -worsening -feels like she is spinning, not the room  -She tried the Phenytoin for 2 days and did not feel benefit so stopped it.   -she feels the gabapentin aggravates this and she would love to wean off but knows it helps her.   -She feels very sleepy during the day.   -She has never tried Topirmate before.   5) Sore throat -developed stopping lamotrigine since not helping.   Prior history:  Her pain continues to be in her left eye, forehead. She has not tried any topicals for pain. She uses capsaicin for her back and knees.   Since last visit nothing we have tried has helped with her pain. She has tried to decrease her Gabapentin dose but then her pain increased so she has resumed taking 4 Gabapentin per day.   Her husband asks whether he should contact her surgeon regarding whether nerve may have become unclamped.   Discussed prolotherapy, trigger point injections, Baclofen, Clonazepam, Phenytoin as other options we could  try,=.  Pain continues to be excruciating and she states she is not sure how much longer she can live like this.   Prior history: She has having some nausea, lightheadedness and felt this may be related to her Gabapentin. She did not take any yesterday and then felt more sever pain in the evening. She also gets electrical shooting across her jaw.   She does not feel that she has cluster headaches as the pain has been so constant. She feels that she is getting worse. The oxygen made her feel relaxed but it is not helping.   The Sarna lotion does help with the itching in her head.   She was willing to try the Amitriptyline but it makes her too sleepy in the morning.   Her teeth and left eye incredibly hurt.   She feels that the Gabapentin has been making her dizziness.   She  asks about biofeedback.   The pain is 7/10 on average, similar to last time.   When the pain is severe it can make her depressed.   Pain Inventory Pain Right Now 10 My pain is constant, sharp, burning, dull, stabbing, tingling, and aching  In the last 24 hours, has pain interfered with the following? General activity 10 Relation with others 7 Enjoyment of life 8 What TIME of day is your pain at its worst? varies Sleep (in general) Good  Pain is worse with: bending Pain improves with: rest and medication Relief from Meds: unknown  Family History  Problem Relation Age of Onset   Heart disease Mother    Heart disease Father    Stroke Father    Stroke Sister    Asthma Daughter    Colon cancer Neg Hx    Social History   Socioeconomic History   Marital status: Married    Spouse name: Not on file   Number of children: 2   Years of education: college   Highest education level: Not on file  Occupational History   Occupation: housewife    Employer: RETIRED  Tobacco Use   Smoking status: Never   Smokeless tobacco: Never  Vaping Use   Vaping Use: Never used  Substance and Sexual Activity    Alcohol use: No    Alcohol/week: 0.0 standard drinks   Drug use: No   Sexual activity: Not on file  Other Topics Concern   Not on file  Social History Narrative   1 cup of Jasmine tea daily    Social Determinants of Health   Financial Resource Strain: Not on file  Food Insecurity: Not on file  Transportation Needs: Not on file  Physical Activity: Not on file  Stress: Not on file  Social Connections: Not on file   Past Surgical History:  Procedure Laterality Date   CHOLECYSTECTOMY  03/2010   COLONOSCOPY  2009   Dr. Raford Pitcher    CYSTOSCOPY     Multiple Cystoscopies with stents or biopsy    ESOPHAGOGASTRODUODENOSCOPY  10/2013   EYE SURGERY     bilateral cataract removal   Gamma knife radiation  01/2018   for trigeminal neuralgia   REFRACTIVE SURGERY Bilateral 2022   RHIZOTOMY Left 04/28/2021   Procedure: Left Percutaneous trigeminal balloon rhizotomy;  Surgeon: Judith Part, MD;  Location: [facility];  Service: Neurosurgery;  Laterality: Left;   TONSILLECTOMY     Past Surgical History:  Procedure Laterality Date   CHOLECYSTECTOMY  03/2010   COLONOSCOPY  2009   Dr. Raford Pitcher    CYSTOSCOPY     Multiple Cystoscopies with stents or biopsy    ESOPHAGOGASTRODUODENOSCOPY  10/2013   EYE SURGERY     bilateral cataract removal   Gamma knife radiation  01/2018   for trigeminal neuralgia   REFRACTIVE SURGERY Bilateral 2022   RHIZOTOMY Left 04/28/2021   Procedure: Left Percutaneous trigeminal balloon rhizotomy;  Surgeon: Judith Part, MD;  Location: [place];  Service: Neurosurgery;  Laterality: Left;   TONSILLECTOMY     Past Medical History:  Diagnosis Date   Adenomatous colon polyp 03/24/2018   Allergy    Arthritis    Cataract    Chronic headaches    Depression    Fibromyalgia    Gallstones    GERD (gastroesophageal reflux disease)    Hyperlipemia    Hypertension    Interstitial cystitis    Lactose intolerance    LBBB (left bundle branch block)  Osteoarthritis    Osteopenia    Pneumonia    Thyroid disease    Tinnitus    Trigeminal neuralgia    There were no vitals taken for this visit.  Opioid Risk Score:   Fall Risk Score:  `1  Depression screen PHQ 2/9  Depression screen [facility] 2/9 07/31/2021 07/08/2021 06/02/2021 05/06/2021 10/28/2020 06/26/2020 05/14/2020  Decreased Interest 0 0 1 0 0 0 0  Down, Depressed, Hopeless 0 0 1 - 0 0 0  PHQ - 2 Score 0 0 2 0 0 0 0  Altered sleeping - - - 0 - - -  Tired, decreased energy - - - - - - -  Change in appetite - - - - - - -  Feeling bad or failure about yourself  - - - - - - -  Trouble concentrating - - - - - - -  Moving slowly or fidgety/restless - - - - - - -  Suicidal thoughts - - - - - - -  PHQ-9 Score - - - 0 - - -  Difficult doing work/chores - - - - - - -    Review of Systems  Constitutional: Negative.   HENT: Negative.    Eyes:  Positive for visual disturbance.  Respiratory: Negative.    Cardiovascular: Negative.   Gastrointestinal: Negative.   Endocrine: Negative.   Genitourinary: Negative.   Musculoskeletal: Negative.        Pain in left side of face  Skin: Negative.   Allergic/Immunologic: Negative.   Neurological:  Positive for dizziness, tremors, light-headedness and numbness.       Tingling   Hematological: Negative.   Psychiatric/Behavioral: Negative.    All other systems reviewed and are negative.     Objective:   Physical Exam Not performed as patient was seen via phone visit    Assessment & Plan:  [name] is an 81 year old woman who presents for f/u of left sided trigeminal neuralgia and dizziness.    1) Sensory deafferation pain, left sided trigeminal neuralgia vs cluster headaches (she does have lacrimation and runny nose on that side).  She does have a history of migraines.  -decrease fentanyl patch to 84mcg- discussed after three days of this dose to cut patch in half and wean to 12.16mcg for three days, then to get off patch afterward. If pain  agressively returns and it seems that Fentanyl patch was providing some relief she can always increase dose -prescribed Narcan while on Fentanyl -discussed with April the neurology referral and she did specify Dr. Juel Burrow and spoke with the [facility]rology department -discussed CBD oil Recommend topical CBD oil- discussed its benefits in reducing inflammation, pain, insomnia, and anxiety.  -Discussed that CBD oil differs from marijuana in that it does not contain THC- the substance that causes euphoria.  -Discussed that it is made from the hemp plant.  -It has been used for thousands of years -preliminary research suggests that if may be able to shrink cancerous tumors, stop plaque formation in Alzheimer's Disease, and slow the progress of brain disease from concussions.  -Additional benefits that have been demonstrated in studies include improved nausea, indigestion, and brain health, and reduced seizures.  -In a survey 92% of patients who tried medical cannabis felt it improved symptoms such as chronic pain, arthritis, migraines, and cancer.   -Provided with a pain relief journal and discussed that it contains foods and lifestyle tips to naturally help to improve pain. Discussed that these  lifestyle strategies are also very good for health unlike some medications which can have negative side effects. Discussed that the act of keeping a journal can be therapeutic and helpful to realize patterns what helps to trigger and alleviate pain.   -discussed with Dr. Joaquim Nam trigeminal deafferation pain, discussed the difference between this and trigeminal neuralgia with patient and husband, discussed motor cortex stimulation. -discussed whether or not to continue fentanyl patch, husband does feel that the IV fentanyl  -encouraged following with Dr. Sweet/Dr. Sabra Heck at [facility] to try procedure recommended by D. Ostegard.  -stop lamotrigine as it is making her dizzy without benefit -reviewed  patient's medical notes -discussed her progress with getting a referral to [facility] -recommended going to ED given darkening of skin, worsening eye closure, finding of cyst, dizziness to receive prompt neurosurgical eval regarding if surgery is necessary, and also for pain management given worsening pain refractory to all treatments and resulting suicidality. Reviewed ED doc notes- he discussed with on-call neurology and NSGY and determined that patient could be discharged with outpatient follow-up. IV fentanyl was administered with relief to patient. She was discharged on fentanyl patch which has not provided relief. She is getting 21mcg- no side effects- discussed increasing to 72mcg and her husband is in agreement. Prior auth completed and approved. Discussed with husband starting patch on Satruday after she has worn 41mcg patch for 72 hours -encouraged follow-up with Dr. Joaquim Nam, husband let me know appointment has been set up 1/18.  -recommended follow-up with Dr. 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  -Discussed that turmeric  -discussed steroid taper. -continue savella, husband feels like it is helping with her suicidal thoughts. Increase to 25mg .  -continue hydrocodone.  -discuss mouth guard with dentist.  -d/c topamax -she does have another procedure scheduled. -discussed benefits of infrared light.  -Norco prescribed 5mg  TID PRN -follow-up monthly with Zella Ball and with me in February, placed on waitlist for earlier appointment with me, advised phone visits/MyChart are a great way to communicate in the interim -discussed that she can take an additional one or two tylenol  for breakthrough pain -recommended checking CMP for liver enzymes with next labs -prescribed NAC 600mg  BID to protect the liver while taking tylenol, discussed its health benefits for mitochondrial function -d/c Baclofen  -f/u with Dr. Joaquim Nam and Dr. Davy Pique 1/18 -discussed retrying carbamazepine to see if it is effective for her. It is $89 for her, so we discussed trying Lamictal instead.  -encouraged anti-inflammatory diet.  -she had sinusitis that was viral and was treated with antibiotics. This was in the early 24s. The symptoms resolved after 2 months. She uses to get sinus infections and saline rinse.  -She has had multiple root canals.  -Discontinue 100% oxygen via facemask as this was not helpful.  -Discussed that unfortunately Sprint PNS is not indicated for craniofacial pain. -Amitriptyline and Lyrica did not help.  -Continue Cymbalta 60mg .  -Topiramate helped but caused dizziness.  -Provided with hand out with information regarding trigeminal neuralgia -Failed Penytoin.  -trial lidocaine patch -She would like to try biofeedback. -reviewed neurology note.  -referred to Dr. Juel Burrow.  -will try trigger point injections next visit.  -Continue Ketamine 10%, Baclofen 2%, Cyclobenzaprine 2%,  Ketoprofen 10%, Gabapentin 6%, Bupivacaine 1%, Amitryptiline 5% to apply to painful areas- using 3-4 times per day.  -Can consider Meloxicam if Phenytoin does not help.  -Discussed Qutenza as an option for neuropathic pain control. Discussed that this is a capsaicin patch, stronger than capsaicin cream. Discussed that it is currently approved for diabetic peripheral neuropathy and post-herpetic neuralgia, but that it has also shown benefit in treating other forms of neuropathy. Provided patient with link to site to learn more about the patch: [employer].fr. Discussed that the patch would be placed in office and benefits usually last 3 months. Discussed that unintended exposure to  capsaicin can cause severe irritation of eyes, mucous membranes, respiratory tract, and skin, but that Qutenza is a local treatment and does not have the systemic side effects of other nerve medications. Discussed that there may be pain, itching, erythema, and decreased sensory function associated with the application of Qutenza. Side effects usually subside within 1 week. A cold pack of analgesic medications can help with these side effects. Blood pressure can also be increased due to pain associated with administration of the patch.  -she cut down on her gabapentin due to her dizziness    Turmeric to reduce inflammation--can be used in cooking or taken as a supplement.  Benefits of turmeric:  -Highly anti-inflammatory  -Increases antioxidants  -Improves memory, attention, brain disease  -Lowers risk of heart disease  -May help prevent cancer  -Decreases pain  -Alleviates depression  -Delays aging and decreases risk of chronic disease  -Consume with black pepper to increase absorption    Turmeric Milk Recipe:  1 cup milk  1 tsp turmeric  1 tsp cinnamon  1 tsp grated ginger (optional)  Black pepper (boosts the anti-inflammatory properties of turmeric).  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  -she is a good metabolizer of amitriptyline and has tolerated nortrypilline in the past, but without  much benefit.    3) CKD: encouraged 6-8 glasses of water per day. She states that she had AKI with Ibuprofen in the past. Discussed checking Cr level next visit if we are considering Meloxicam -Discussed that Nucynta, and other pain medications, may stay in her bloodstream longer given her CKD   4) Hypernatremia: encouraged hydration   5) General health and pain: provided with list of healthy foods that are both nutritious and fight pain.   6) Depressed: Amitriptyline did help with this but she could not tolerate the drowsiness. Encouraged focusing on the factors that she can control, such as anti-inflammatory diet.   7) Fibromyalgia: Continue 30mg  of steroids per day for fibromyalgia. She is on that intermittently. She started this again about a month ago. She has been on this about a year. Continue Savella  8) Dizziness: -discussed that this could be from the combination of gabapentin and baclofen.  -discussed that all the medications we are trying for pain may worsen the dizziness  9) Constipation:  -Provided list of  following foods that help with constipation and highlighted a few: 1) prunes- contain high amounts of fiber.  2) apples- has a form of dietary fiber called pectin that accelerates stool movement and increases beneficial gut bacteria 3) pears- in addition to fiber, also high in fructose and sorbitol which have laxative effect 4) figs- contain an enzyme ficin which helps to speed colonic transit 5) kiwis- contain an enzyme actinidin that improves gut motility and reduces constipation 6) oranges- rich in pectin (like apples) 7) grapefruits- contain a flavanol naringenin which has a laxative effect 8) vegetables- rich in fiber and also great sources of folate, vitamin C, and K 9) artichoke- high in inulin, prebiotic great for the microbiome 10) chicory- increases stool frequency and softness (can be added to coffee) 11) rhubarb- laxative effect 12) sweet potato- high  fiber 13) beans, peas, and lentils- contain both soluble and insoluble fiber 14) chia seeds- improves intestinal health and gut flora 15) flaxseeds- laxative effect 16) whole grain rye bread- high in fiber 17) oat bran- high in soluble and insoluble fiber 18) kefir- softens stools -recommended to try at least one of these foods every day.  -drink 6-8 glasses of water per day -walk regularly, especially after meals.   10) Osteopenia - was tapered off the prednisone -discussed that currently her pain may be more of a risk to her than osteopenia given her suicidal ideation from her pain -discussed risk of fall with the medications that make her dizziness.   5 minutes spent in discussion of her pain, weaning off the Fentanyl patch, what to do if pain increases during this wean, discussion of referral to neurology, completion of current Savella before switching to Wellbutrin

## 2021-08-11 ENCOUNTER — Encounter (HOSPITAL_BASED_OUTPATIENT_CLINIC_OR_DEPARTMENT_OTHER): Payer: Medicare Other | Admitting: Physical Medicine and Rehabilitation

## 2021-08-11 ENCOUNTER — Other Ambulatory Visit: Payer: Self-pay

## 2021-08-11 DIAGNOSIS — G9689 Other specified disorders of central nervous system: Secondary | ICD-10-CM

## 2021-08-11 DIAGNOSIS — G500 Trigeminal neuralgia: Secondary | ICD-10-CM | POA: Diagnosis not present

## 2021-08-11 DIAGNOSIS — G501 Atypical facial pain: Secondary | ICD-10-CM | POA: Diagnosis not present

## 2021-08-12 ENCOUNTER — Encounter: Payer: Self-pay | Admitting: Physical Medicine and Rehabilitation

## 2021-08-13 DIAGNOSIS — M150 Primary generalized (osteo)arthritis: Secondary | ICD-10-CM | POA: Diagnosis not present

## 2021-08-13 DIAGNOSIS — M797 Fibromyalgia: Secondary | ICD-10-CM | POA: Diagnosis not present

## 2021-08-13 DIAGNOSIS — M170 Bilateral primary osteoarthritis of knee: Secondary | ICD-10-CM | POA: Diagnosis not present

## 2021-08-13 DIAGNOSIS — M25562 Pain in left knee: Secondary | ICD-10-CM | POA: Diagnosis not present

## 2021-08-13 DIAGNOSIS — M25559 Pain in unspecified hip: Secondary | ICD-10-CM | POA: Diagnosis not present

## 2021-08-13 DIAGNOSIS — M353 Polymyalgia rheumatica: Secondary | ICD-10-CM | POA: Diagnosis not present

## 2021-08-13 DIAGNOSIS — M810 Age-related osteoporosis without current pathological fracture: Secondary | ICD-10-CM | POA: Diagnosis not present

## 2021-08-13 NOTE — Progress Notes (Signed)
Subjective:    Patient ID: [name], female    DOB: 08-12-1940, 81 y.o.   MRN: [id number]  HPI  An audio/video tele-health visit is felt to be the most appropriate encounter for this patient at this time. This is a follow up tele-visit via phone. The patient is at home. MD is at office. Prior to scheduling this appointment, our staff discussed the limitations of evaluation and management by telemedicine and the availability of in-person appointments. The patient expressed understanding and agreed to proceed.   [name] presents today for follow-up of severe trigeminal neuralgia.   1) Severe trigeminal neuralgia: -she is still not doing well.  -the fentanyl patch makes her very sleepy and so allows her to sleep. It has also made her dizzier.  -the 67mcg fentanyl patch did not provide more relief- she would like to wean off. Planning to wean to 41mcg -referral was to [facility] and not to Dr. Juel Burrow she states and they need a new referral send to Dr. Van Clines -she is using the compound cream and this helps- she uses this twice per day -capsaicin made it burn  -lamotrigine makes her too sleepy and dizzy to come down the stairs by herself and she does not feel comfortable going to the shower -left eye more open than usual. -she does not feel she is getting relief from the fentanyl patch -she feels that the procedure at [facility] is a last resort.  -Given darkening of skin and worsening eye lid closure, MRI brain stat obtained and shows new cyst that could be post-surgical. Recommended she go to ED given worsening dizziness and facial swelling as well. ED physician discussed case with NSGY and neurology and discharged home recommending follow-up with Dr. Gean Quint and Dr. Merceda Elks. She was given IV Fentanyl in the ED which helped and discharged on 36mcg Fentanyl patch, which has not helped.  -discussed increasing Fentanyl patch to 65mcg since she has not experienced any  negative side effects, no respiratory depression, and her husband is agreeable. I have sent in new script to start Saturday, but insurance required prior auth, which was approved.  -tonight would be the third tay of the 77mcg Fentanyl patch -she is still taking the Gabapentin -she feels that when she lays down she is spinning.  -eye continues to appear more closed.  -Husband has called Dr. Dionne Ano and Dr. Earlie Raveling office to scheduled follow-up appointments and has appointments with both tomorrow.  -Will run out of Fentanyl patch by Sunday -pain is worsening and nothing is providing relief -she asks whether she should go to ED -she has also noted dizziness and is not sure if this is from being in the MRI or from the [place].  -morphine was not effective and caused itching, sarna lotion did help wit this.  -hydrocodone has been most effective thus far -nucynta was not effective but she is not sure whether she gave it a fair trial -she has month appointments scheduled with Zella Ball and f/u with me in Feb -she used the Norco last night and this morning with good benefit and has started to feel the pain return this afternoon -she did find benefit from the compound cream- she continues to use this.  -She continues to have a sharp shooting pain doing around her eyelid and the tip teeth and in the top of her head.  -has a radiofrequency ablation treatment that helped but not completely -she is not having as much sharp knife-like pin like  in her eye Her top teeth in her left law have been hurting really badly.  -she now takes the baclofen with the gabapentin and they seem to work well together.  -she had surgical procedure and feels worse -yesterday she took a half a tablet but this morning she took a whole one. She does fine with the hydrocodone but a whole one makes her too sleepy.  -she is taking 2 100mg  Gabapentin three times per day but it makes her dizzy and sleepy so she cannot funciton -the  cold worsens her pain.  -The IV fentanyl did help with the pain.  -her husband feels she should get one more dose of the 25mg  fentanyl. -she did get one week benefit from the steroid injection.  -she is doing terribly -she is miserable -she feels depressed due to the pain -she has considered taking the whole bottle of hydrocodone because because she can't take this pain anymore.  -she started taking the Gabapentin 300mg  TID.  -she does find relief from the hydrocodone -the pain is constant -she wonders if it is inflammation.  -she has not tried turmeric. Her son-in-law gave her a bottle and she couldn't stomach it.   2) Fibromyalgia: -She has been almost nonfunctional due to the severity of her pain. It continues to be very severe.  -She would like to wean off the Gabapentin as she feels at risk of falls -she is taking Topamax BID. She felt this was helpful but made her unsteady on her feet.   3) Osteoporosis: -husband asks whether she should get a Prolia shot.   4) Dizziness: -head was swimming -she felt like she was going to fall.  -worsening -feels like she is spinning, not the room  -She tried the Phenytoin for 2 days and did not feel benefit so stopped it.   -she feels the gabapentin aggravates this and she would love to wean off but knows it helps her.   -She feels very sleepy during the day.   -She has never tried Topirmate before.   5) Sore throat -developed stopping lamotrigine since not helping.   Prior history:  Her pain continues to be in her left eye, forehead. She has not tried any topicals for pain. She uses capsaicin for her back and knees.   Since last visit nothing we have tried has helped with her pain. She has tried to decrease her Gabapentin dose but then her pain increased so she has resumed taking 4 Gabapentin per day.   Her husband asks whether he should contact her surgeon regarding whether nerve may have become unclamped.   Discussed  prolotherapy, trigger point injections, Baclofen, Clonazepam, Phenytoin as other options we could try,=.  Pain continues to be excruciating and she states she is not sure how much longer she can live like this.   Prior history: She has having some nausea, lightheadedness and felt this may be related to her Gabapentin. She did not take any yesterday and then felt more sever pain in the evening. She also gets electrical shooting across her jaw.   She does not feel that she has cluster headaches as the pain has been so constant. She feels that she is getting worse. The oxygen made her feel relaxed but it is not helping.   The Sarna lotion does help with the itching in her head.   She was willing to try the Amitriptyline but it makes her too sleepy in the morning.   Her teeth and left eye  incredibly hurt.   She feels that the Gabapentin has been making her dizziness.   She asks about biofeedback.   The pain is 7/10 on average, similar to last time.   When the pain is severe it can make her depressed.   Pain Inventory Pain Right Now 10 My pain is constant, sharp, burning, dull, stabbing, tingling, and aching  In the last 24 hours, has pain interfered with the following? General activity 10 Relation with others 7 Enjoyment of life 8 What TIME of day is your pain at its worst? varies Sleep (in general) Good  Pain is worse with: bending Pain improves with: rest and medication Relief from Meds: unknown  Family History  Problem Relation Age of Onset   Heart disease Mother    Heart disease Father    Stroke Father    Stroke Sister    Asthma Daughter    Colon cancer Neg Hx    Social History   Socioeconomic History   Marital status: Married    Spouse name: Not on file   Number of children: 2   Years of education: college   Highest education level: Not on file  Occupational History   Occupation: housewife    Employer: RETIRED  Tobacco Use   Smoking status: Never    Smokeless tobacco: Never  Vaping Use   Vaping Use: Never used  Substance and Sexual Activity   Alcohol use: No    Alcohol/week: 0.0 standard drinks   Drug use: No   Sexual activity: Not on file  Other Topics Concern   Not on file  Social History Narrative   1 cup of Jasmine tea daily    Social Determinants of Health   Financial Resource Strain: Not on file  Food Insecurity: Not on file  Transportation Needs: Not on file  Physical Activity: Not on file  Stress: Not on file  Social Connections: Not on file   Past Surgical History:  Procedure Laterality Date   CHOLECYSTECTOMY  03/2010   COLONOSCOPY  2009   Dr. Raford Pitcher    CYSTOSCOPY     Multiple Cystoscopies with stents or biopsy    ESOPHAGOGASTRODUODENOSCOPY  10/2013   EYE SURGERY     bilateral cataract removal   Gamma knife radiation  01/2018   for trigeminal neuralgia   REFRACTIVE SURGERY Bilateral 2022   RHIZOTOMY Left 04/28/2021   Procedure: Left Percutaneous trigeminal balloon rhizotomy;  Surgeon: Judith Part, MD;  Location: [facility];  Service: Neurosurgery;  Laterality: Left;   TONSILLECTOMY     Past Surgical History:  Procedure Laterality Date   CHOLECYSTECTOMY  03/2010   COLONOSCOPY  2009   Dr. Raford Pitcher    CYSTOSCOPY     Multiple Cystoscopies with stents or biopsy    ESOPHAGOGASTRODUODENOSCOPY  10/2013   EYE SURGERY     bilateral cataract removal   Gamma knife radiation  01/2018   for trigeminal neuralgia   REFRACTIVE SURGERY Bilateral 2022   RHIZOTOMY Left 04/28/2021   Procedure: Left Percutaneous trigeminal balloon rhizotomy;  Surgeon: Judith Part, MD;  Location: [facility];  Service: Neurosurgery;  Laterality: Left;   TONSILLECTOMY     Past Medical History:  Diagnosis Date   Adenomatous colon polyp 03/24/2018   Allergy    Arthritis    Cataract    Chronic headaches    Depression    Fibromyalgia    Gallstones    GERD (gastroesophageal reflux disease)    Hyperlipemia     Hypertension  Interstitial cystitis    Lactose intolerance    LBBB (left bundle branch block)    Osteoarthritis    Osteopenia    Pneumonia    Thyroid disease    Tinnitus    Trigeminal neuralgia    There were no vitals taken for this visit.  Opioid Risk Score:   Fall Risk Score:  `1  Depression screen PHQ 2/9  Depression screen [facility] 2/9 07/31/2021 07/08/2021 06/02/2021 05/06/2021 10/28/2020 06/26/2020 05/14/2020  Decreased Interest 0 0 1 0 0 0 0  Down, Depressed, Hopeless 0 0 1 - 0 0 0  PHQ - 2 Score 0 0 2 0 0 0 0  Altered sleeping - - - 0 - - -  Tired, decreased energy - - - - - - -  Change in appetite - - - - - - -  Feeling bad or failure about yourself  - - - - - - -  Trouble concentrating - - - - - - -  Moving slowly or fidgety/restless - - - - - - -  Suicidal thoughts - - - - - - -  PHQ-9 Score - - - 0 - - -  Difficult doing work/chores - - - - - - -    Review of Systems  Constitutional: Negative.   HENT: Negative.    Eyes:  Positive for visual disturbance.  Respiratory: Negative.    Cardiovascular: Negative.   Gastrointestinal: Negative.   Endocrine: Negative.   Genitourinary: Negative.   Musculoskeletal: Negative.        Pain in left side of face  Skin: Negative.   Allergic/Immunologic: Negative.   Neurological:  Positive for dizziness, tremors, light-headedness and numbness.       Tingling   Hematological: Negative.   Psychiatric/Behavioral: Negative.    All other systems reviewed and are negative.     Objective:   Physical Exam Not performed as patient was seen via phone visit    Assessment & Plan:  [name] is an 81 year old woman who presents for f/u of left sided trigeminal neuralgia and dizziness.    1) Sensory deafferation pain, left sided trigeminal neuralgia vs cluster headaches (she does have lacrimation and runny nose on that side).  She does have a history of migraines.  -called [facility] to set up an appointment for her with them.  Unfortunately Dr. Van Clines has retired but was able to set her up with Dr. Christin Bach who specializes in migraines and trigeminal neuralgia on July 25th. Let patient and husband know. They will mail her their address.  -decrease fentanyl patch to 23mcg- discussed after three days of this dose to cut patch in half and wean to 12.62mcg for three days, then to get off patch afterward. If pain agressively returns and it seems that Fentanyl patch was providing some relief she can always increase dose -prescribed Narcan while on Fentanyl -discussed with April the neurology referral and she did specify Dr. Juel Burrow and spoke with the [facility] department -discussed CBD oil Recommend topical CBD oil- discussed its benefits in reducing inflammation, pain, insomnia, and anxiety.  -Discussed that CBD oil differs from marijuana in that it does not contain THC- the substance that causes euphoria.  -Discussed that it is made from the hemp plant.  -It has been used for thousands of years -preliminary research suggests that if may be able to shrink cancerous tumors, stop plaque formation in Alzheimer's Disease, and slow the progress of brain disease from concussions.  -  Additional benefits that have been demonstrated in studies include improved nausea, indigestion, and brain health, and reduced seizures.  -In a survey 92% of patients who tried medical cannabis felt it improved symptoms such as chronic pain, arthritis, migraines, and cancer.   -Provided with a pain relief journal and discussed that it contains foods and lifestyle tips to naturally help to improve pain. Discussed that these lifestyle strategies are also very good for health unlike some medications which can have negative side effects. Discussed that the act of keeping a journal can be therapeutic and helpful to realize patterns what helps to trigger and alleviate pain.   -discussed with Dr. Joaquim Nam trigeminal deafferation pain, discussed the  difference between this and trigeminal neuralgia with patient and husband, discussed motor cortex stimulation. -discussed whether or not to continue fentanyl patch, husband does feel that the IV fentanyl  -encouraged following with Dr. Sweet/Dr. Sabra Heck at [facility] to try procedure recommended by D. Ostegard.  -stop lamotrigine as it is making her dizzy without benefit -reviewed patient's medical notes -discussed her progress with getting a referral to [facility] -recommended going to ED given darkening of skin, worsening eye closure, finding of cyst, dizziness to receive prompt neurosurgical eval regarding if surgery is necessary, and also for pain management given worsening pain refractory to all treatments and resulting suicidality. Reviewed ED doc notes- he discussed with on-call neurology and NSGY and determined that patient could be discharged with outpatient follow-up. IV fentanyl was administered with relief to patient. She was discharged on fentanyl patch which has not provided relief. She is getting 41mcg- no side effects- discussed increasing to 69mcg and her husband is in agreement. Prior auth completed and approved. Discussed with husband starting patch on Satruday after she has worn 88mcg patch for 72 hours -encouraged follow-up with Dr. Joaquim Nam, husband let me know appointment has been set up 1/18.  -recommended follow-up with Dr. 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  -Discussed that turmeric  -discussed steroid taper. -continue savella, husband feels like it is helping with her suicidal thoughts. Increase to 25mg .  -continue hydrocodone.  -discuss mouth guard  with dentist.  -d/c topamax -she does have another procedure scheduled. -discussed benefits of infrared light.  -Norco prescribed 5mg  TID PRN -follow-up monthly with Zella Ball and with me in February, placed on waitlist for earlier appointment with me, advised phone visits/MyChart are a great way to communicate in the interim -discussed that she can take an additional one or two tylenol for breakthrough pain -recommended checking CMP for liver enzymes with next labs -prescribed NAC 600mg  BID to protect the liver while taking tylenol, discussed its health benefits for mitochondrial function -d/c Baclofen  -f/u with Dr. Joaquim Nam and Dr. Davy Pique 1/18 -discussed retrying carbamazepine to see if it is effective for her. It is $89 for her, so we discussed trying Lamictal instead.  -encouraged anti-inflammatory diet.  -she had sinusitis that was viral and was treated with antibiotics. This was in the early 50s. The symptoms resolved after 2 months. She uses to get sinus infections and saline rinse.  -She has had multiple root canals.  -Discontinue 100% oxygen via facemask as this was not helpful.  -Discussed that unfortunately [employer] PNS is not  indicated for craniofacial pain. -Amitriptyline and Lyrica did not help.  -Continue Cymbalta 60mg .  -Topiramate helped but caused dizziness.  -Provided with hand out with information regarding trigeminal neuralgia -Failed Penytoin.  -trial lidocaine patch -She would like to try biofeedback. -reviewed neurology note.  -referred to Dr. Juel Burrow.  -will try trigger point injections next visit.  -Continue Ketamine 10%, Baclofen 2%, Cyclobenzaprine 2%, Ketoprofen 10%, Gabapentin 6%, Bupivacaine 1%, Amitryptiline 5% to apply to painful areas- using 3-4 times per day.  -Can consider Meloxicam if Phenytoin does not help.  -Discussed Qutenza as an option for neuropathic pain control. Discussed that this is a capsaicin patch, stronger than capsaicin cream.  Discussed that it is currently approved for diabetic peripheral neuropathy and post-herpetic neuralgia, but that it has also shown benefit in treating other forms of neuropathy. Provided patient with link to site to learn more about the patch: [employer].fr. Discussed that the patch would be placed in office and benefits usually last 3 months. Discussed that unintended exposure to capsaicin can cause severe irritation of eyes, mucous membranes, respiratory tract, and skin, but that Qutenza is a local treatment and does not have the systemic side effects of other nerve medications. Discussed that there may be pain, itching, erythema, and decreased sensory function associated with the application of Qutenza. Side effects usually subside within 1 week. A cold pack of analgesic medications can help with these side effects. Blood pressure can also be increased due to pain associated with administration of the patch.  -she cut down on her gabapentin due to her dizziness    Turmeric to reduce inflammation--can be used in cooking or taken as a supplement.  Benefits of turmeric:  -Highly anti-inflammatory  -Increases antioxidants  -Improves memory, attention, brain disease  -Lowers risk of heart disease  -May help prevent cancer  -Decreases pain  -Alleviates depression  -Delays aging and decreases risk of chronic disease  -Consume with black pepper to increase absorption    Turmeric Milk Recipe:  1 cup milk  1 tsp turmeric  1 tsp cinnamon  1 tsp grated ginger (optional)  Black pepper (boosts the anti-inflammatory properties of turmeric).  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  -she is a good metabolizer of amitriptyline and has tolerated nortrypilline in the past, but without much benefit.    3) CKD: encouraged 6-8 glasses of water per day. She states that she had AKI with Ibuprofen in the past. Discussed checking Cr level next visit if we are considering Meloxicam -Discussed that Nucynta, and other pain medications, may stay in her bloodstream longer given her CKD   4) Hypernatremia: encouraged hydration   5) General health and pain: provided with list of healthy foods that are both nutritious and fight pain.   6) Depressed: Amitriptyline did help with this but she could not tolerate the drowsiness. Encouraged focusing on the factors that she can control, such as anti-inflammatory diet.   7) Fibromyalgia: Continue  30mg  of steroids per day for fibromyalgia. She is on that intermittently. She started this again about a month ago. She has been on this about a year. Continue Savella  8) Dizziness: -discussed that this could be from the combination of gabapentin and baclofen.  -discussed that all the medications we are trying for pain may worsen the dizziness  9) Constipation:  -Provided list of following foods that help with constipation and highlighted a few: 1) prunes- contain high amounts of fiber.  2) apples- has a form of dietary fiber called pectin that accelerates stool movement and increases beneficial gut bacteria 3) pears- in addition to fiber, also high in fructose and sorbitol which have laxative effect 4) figs- contain an enzyme ficin which helps to speed colonic transit 5) kiwis- contain an enzyme actinidin that improves gut motility and reduces constipation 6)  oranges- rich in pectin (like apples) 7) grapefruits- contain a flavanol naringenin which has a laxative effect 8) vegetables- rich in fiber and also great sources of folate, vitamin C, and K 9) artichoke- high in inulin, prebiotic great for the microbiome 10) chicory- increases stool frequency and softness (can be added to coffee) 11) rhubarb- laxative effect 12) sweet potato- high fiber 13) beans, peas, and lentils- contain both soluble and insoluble fiber 14) chia seeds- improves intestinal health and gut flora 15) flaxseeds- laxative effect 16) whole grain rye bread- high in fiber 17) oat bran- high in soluble and insoluble fiber 18) kefir- softens stools -recommended to try at least one of these foods every day.  -drink 6-8 glasses of water per day -walk regularly, especially after meals.   10) Osteopenia - was tapered off the prednisone -discussed that currently her pain may be more of a risk to her than osteopenia given her suicidal ideation from her pain -discussed risk of fall with the medications that make her dizziness.   6 minutes spent in discussion of her pain, somnolence and dizziness with Fentanyl patch and lack of efficacy, plan to wean off, discussed that I would call neurology to help set up an appointment with them for [facility]

## 2021-08-20 ENCOUNTER — Encounter: Payer: Self-pay | Admitting: Physical Medicine and Rehabilitation

## 2021-08-20 ENCOUNTER — Ambulatory Visit
Admission: RE | Admit: 2021-08-20 | Discharge: 2021-08-20 | Disposition: A | Discharge status: Home / Self Care | Payer: Medicare Other | Source: Ambulatory Visit | Attending: Pulmonary Disease | Admitting: Pulmonary Disease

## 2021-08-20 DIAGNOSIS — I2510 Atherosclerotic heart disease of native coronary artery without angina pectoris: Secondary | ICD-10-CM | POA: Diagnosis not present

## 2021-08-20 DIAGNOSIS — I728 Aneurysm of other specified arteries: Secondary | ICD-10-CM | POA: Diagnosis not present

## 2021-08-20 DIAGNOSIS — I898 Other specified noninfective disorders of lymphatic vessels and lymph nodes: Secondary | ICD-10-CM | POA: Diagnosis not present

## 2021-08-20 DIAGNOSIS — R942 Abnormal results of pulmonary function studies: Secondary | ICD-10-CM

## 2021-08-20 DIAGNOSIS — J984 Other disorders of lung: Secondary | ICD-10-CM | POA: Diagnosis not present

## 2021-08-21 ENCOUNTER — Encounter
Payer: Medicare Other | Attending: Physical Medicine and Rehabilitation | Admitting: Physical Medicine and Rehabilitation

## 2021-08-21 ENCOUNTER — Other Ambulatory Visit: Payer: Self-pay

## 2021-08-21 DIAGNOSIS — G9689 Other specified disorders of central nervous system: Secondary | ICD-10-CM | POA: Insufficient documentation

## 2021-08-22 NOTE — Progress Notes (Signed)
Subjective:    Patient ID: [name], female    DOB: 1941/01/19, 81 y.o.   MRN: [id number]  HPI  An audio/video tele-health visit is felt to be the most appropriate encounter for this patient at this time. This is a follow up tele-visit via phone. The patient is at home. MD is at office. Prior to scheduling this appointment, our staff discussed the limitations of evaluation and management by telemedicine and the availability of in-person appointments. The patient expressed understanding and agreed to proceed.   [name] presents today for follow-up of severe trigeminal neuralgia.   1) Severe trigeminal neuralgia: -she is still not doing well. -she contacted Dr. Davy Pique and he recommended she pause wean of Fentanyl patch and I agree with this -she took last patch last night. She still has 12.6mcg patch available -her husband asks whether she should take a higher dose of gabapentin -the fentanyl patch makes her very sleepy and so allows her to sleep. It has also made her dizzier.  -the 37mcg fentanyl patch did not provide more relief- she would like to wean off. Weaned to 25, then 12.43mcg, then off this week -referral was to [facility] and not to Dr. Juel Burrow she states and they need a new referral send to Dr. Van Clines -she is using the compound cream and this helps- she uses this twice per day -capsaicin made it burn  -lamotrigine makes her too sleepy and dizzy to come down the stairs by herself and she does not feel comfortable going to the shower -left eye more open than usual. -she does not feel she is getting relief from the fentanyl patch -she feels that the procedure at [facility] is a last resort.  -Given darkening of skin and worsening eye lid closure, MRI brain stat obtained and shows new cyst that could be post-surgical. Recommended she go to ED given worsening dizziness and facial swelling as well. ED physician discussed case with NSGY and neurology and  discharged home recommending follow-up with Dr. Gean Quint and Dr. Merceda Elks. She was given IV Fentanyl in the ED which helped and discharged on 10mcg Fentanyl patch, which has not helped.  -discussed increasing Fentanyl patch to 29mcg since she has not experienced any negative side effects, no respiratory depression, and her husband is agreeable. I have sent in new script to start Saturday, but insurance required prior auth, which was approved.  -tonight would be the third tay of the 56mcg Fentanyl patch -she is still taking the Gabapentin -she feels that when she lays down she is spinning.  -eye continues to appear more closed.  -Husband has called Dr. Dionne Ano and Dr. Earlie Raveling office to scheduled follow-up appointments and has appointments with both tomorrow.  -Will run out of Fentanyl patch by Sunday -pain is worsening and nothing is providing relief -she asks whether she should go to ED -she has also noted dizziness and is not sure if this is from being in the MRI or from the [place].  -morphine was not effective and caused itching, sarna lotion did help wit this.  -hydrocodone has been most effective thus far -nucynta was not effective but she is not sure whether she gave it a fair trial -she has month appointments scheduled with Zella Ball and f/u with me in Feb -she used the Norco last night and this morning with good benefit and has started to feel the pain return this afternoon -she did find benefit from the compound cream- she continues to use this.  -  She continues to have a sharp shooting pain doing around her eyelid and the tip teeth and in the top of her head.  -has a radiofrequency ablation treatment that helped but not completely -she is not having as much sharp knife-like pin like in her eye Her top teeth in her left law have been hurting really badly.  -she now takes the baclofen with the gabapentin and they seem to work well together.  -she had surgical procedure and feels  worse -yesterday she took a half a tablet but this morning she took a whole one. She does fine with the hydrocodone but a whole one makes her too sleepy.  -she is taking 2 100mg  Gabapentin three times per day but it makes her dizzy and sleepy so she cannot funciton -the cold worsens her pain.  -The IV fentanyl did help with the pain.  -her husband feels she should get one more dose of the 25mg  fentanyl. -she did get one week benefit from the steroid injection.  -she is doing terribly -she is miserable -she feels depressed due to the pain -she has considered taking the whole bottle of hydrocodone because because she can't take this pain anymore.  -she started taking the Gabapentin 300mg  TID.  -she does find relief from the hydrocodone -the pain is constant -she wonders if it is inflammation.  -she has not tried turmeric. Her son-in-law gave her a bottle and she couldn't stomach it.   2) Fibromyalgia: -She has been almost nonfunctional due to the severity of her pain. It continues to be very severe.  -She would like to wean off the Gabapentin as she feels at risk of falls -she is taking Topamax BID. She felt this was helpful but made her unsteady on her feet.   3) Osteoporosis: -husband asks whether she should get a Prolia shot.   4) Dizziness: -head was swimming -she felt like she was going to fall.  -worsening -feels like she is spinning, not the room  -She tried the Phenytoin for 2 days and did not feel benefit so stopped it.   -she feels the gabapentin aggravates this and she would love to wean off but knows it helps her.   -She feels very sleepy during the day.   -She has never tried Topirmate before.   5) Sore throat -developed stopping lamotrigine since not helping.   Prior history:  Her pain continues to be in her left eye, forehead. She has not tried any topicals for pain. She uses capsaicin for her back and knees.   Since last visit nothing we have tried has  helped with her pain. She has tried to decrease her Gabapentin dose but then her pain increased so she has resumed taking 4 Gabapentin per day.   Her husband asks whether he should contact her surgeon regarding whether nerve may have become unclamped.   Discussed prolotherapy, trigger point injections, Baclofen, Clonazepam, Phenytoin as other options we could try,=.  Pain continues to be excruciating and she states she is not sure how much longer she can live like this.   Prior history: She has having some nausea, lightheadedness and felt this may be related to her Gabapentin. She did not take any yesterday and then felt more sever pain in the evening. She also gets electrical shooting across her jaw.   She does not feel that she has cluster headaches as the pain has been so constant. She feels that she is getting worse. The oxygen made her feel  relaxed but it is not helping.   The Sarna lotion does help with the itching in her head.   She was willing to try the Amitriptyline but it makes her too sleepy in the morning.   Her teeth and left eye incredibly hurt.   She feels that the Gabapentin has been making her dizziness.   She asks about biofeedback.   The pain is 7/10 on average, similar to last time.   When the pain is severe it can make her depressed.   Pain Inventory Pain Right Now 10 My pain is constant, sharp, burning, dull, stabbing, tingling, and aching  In the last 24 hours, has pain interfered with the following? General activity 10 Relation with others 7 Enjoyment of life 8 What TIME of day is your pain at its worst? varies Sleep (in general) Good  Pain is worse with: bending Pain improves with: rest and medication Relief from Meds: unknown  Family History  Problem Relation Age of Onset   Heart disease Mother    Heart disease Father    Stroke Father    Stroke Sister    Asthma Daughter    Colon cancer Neg Hx    Social History   Socioeconomic History    Marital status: Married    Spouse name: Not on file   Number of children: 2   Years of education: college   Highest education level: Not on file  Occupational History   Occupation: housewife    Employer: RETIRED  Tobacco Use   Smoking status: Never   Smokeless tobacco: Never  Vaping Use   Vaping Use: Never used  Substance and Sexual Activity   Alcohol use: No    Alcohol/week: 0.0 standard drinks   Drug use: No   Sexual activity: Not on file  Other Topics Concern   Not on file  Social History Narrative   1 cup of Jasmine tea daily    Social Determinants of Health   Financial Resource Strain: Not on file  Food Insecurity: Not on file  Transportation Needs: Not on file  Physical Activity: Not on file  Stress: Not on file  Social Connections: Not on file   Past Surgical History:  Procedure Laterality Date   CHOLECYSTECTOMY  03/2010   COLONOSCOPY  2009   Dr. Raford Pitcher    CYSTOSCOPY     Multiple Cystoscopies with stents or biopsy    ESOPHAGOGASTRODUODENOSCOPY  10/2013   EYE SURGERY     bilateral cataract removal   Gamma knife radiation  01/2018   for trigeminal neuralgia   REFRACTIVE SURGERY Bilateral 2022   RHIZOTOMY Left 04/28/2021   Procedure: Left Percutaneous trigeminal balloon rhizotomy;  Surgeon: Judith Part, MD;  Location: [facility];  Service: Neurosurgery;  Laterality: Left;   TONSILLECTOMY     Past Surgical History:  Procedure Laterality Date   CHOLECYSTECTOMY  03/2010   COLONOSCOPY  2009   Dr. Raford Pitcher    CYSTOSCOPY     Multiple Cystoscopies with stents or biopsy    ESOPHAGOGASTRODUODENOSCOPY  10/2013   EYE SURGERY     bilateral cataract removal   Gamma knife radiation  01/2018   for trigeminal neuralgia   REFRACTIVE SURGERY Bilateral 2022   RHIZOTOMY Left 04/28/2021   Procedure: Left Percutaneous trigeminal balloon rhizotomy;  Surgeon: Judith Part, MD;  Location: [facility];  Service: Neurosurgery;  Laterality: Left;    TONSILLECTOMY     Past Medical History:  Diagnosis Date   Adenomatous colon polyp  03/24/2018   Allergy    Arthritis    Cataract    Chronic headaches    Depression    Fibromyalgia    Gallstones    GERD (gastroesophageal reflux disease)    Hyperlipemia    Hypertension    Interstitial cystitis    Lactose intolerance    LBBB (left bundle branch block)    Osteoarthritis    Osteopenia    Pneumonia    Thyroid disease    Tinnitus    Trigeminal neuralgia    There were no vitals taken for this visit.  Opioid Risk Score:   Fall Risk Score:  `1  Depression screen PHQ 2/9  Depression screen [facility] 2/9 07/31/2021 07/08/2021 06/02/2021 05/06/2021 10/28/2020 06/26/2020 05/14/2020  Decreased Interest 0 0 1 0 0 0 0  Down, Depressed, Hopeless 0 0 1 - 0 0 0  PHQ - 2 Score 0 0 2 0 0 0 0  Altered sleeping - - - 0 - - -  Tired, decreased energy - - - - - - -  Change in appetite - - - - - - -  Feeling bad or failure about yourself  - - - - - - -  Trouble concentrating - - - - - - -  Moving slowly or fidgety/restless - - - - - - -  Suicidal thoughts - - - - - - -  PHQ-9 Score - - - 0 - - -  Difficult doing work/chores - - - - - - -    Review of Systems  Constitutional: Negative.   HENT: Negative.    Eyes:  Positive for visual disturbance.  Respiratory: Negative.    Cardiovascular: Negative.   Gastrointestinal: Negative.   Endocrine: Negative.   Genitourinary: Negative.   Musculoskeletal: Negative.        Pain in left side of face  Skin: Negative.   Allergic/Immunologic: Negative.   Neurological:  Positive for dizziness, tremors, light-headedness and numbness.       Tingling   Hematological: Negative.   Psychiatric/Behavioral: Negative.    All other systems reviewed and are negative.     Objective:   Physical Exam Not performed as patient was seen via phone visit    Assessment & Plan:  [name] is an 81 year old woman who presents for f/u of left sided trigeminal neuralgia and  dizziness.    1) Sensory deafferation pain, left sided trigeminal neuralgia vs cluster headaches (she does have lacrimation and runny nose on that side).  She does have a history of migraines.  -called [facility] to set up an appointment for her with them. Unfortunately Dr. Van Clines has retired but was able to set her up with Dr. Christin Bach who specializes in migraines and trigeminal neuralgia on July 25th. Let patient and husband know. They will mail her their address.  -restart 12.51mch fentanyl patch as patient does not feel she can get through weekend with this level of pain -commended her for calling Dr. Davy Pique as well to let him know how she is feeling -recommended taking 1 hydrocodone now for the severe pain, and discussed that the Fentanyl patch with take 12-24 hours to kick in -prescribed Narcan while on Fentanyl -discussed with April the neurology referral and she did specify Dr. Juel Burrow and spoke with the [facility] department -discussed CBD oil, her son bought her some of this and she felt that it greatly helped  Recommend topical CBD oil- discussed its benefits in reducing inflammation, pain,  insomnia, and anxiety.  -Discussed that CBD oil differs from marijuana in that it does not contain THC- the substance that causes euphoria.  -Discussed that it is made from the hemp plant.  -It has been used for thousands of years -preliminary research suggests that if may be able to shrink cancerous tumors, stop plaque formation in Alzheimer's Disease, and slow the progress of brain disease from concussions.  -Additional benefits that have been demonstrated in studies include improved nausea, indigestion, and brain health, and reduced seizures.  -In a survey 92% of patients who tried medical cannabis felt it improved symptoms such as chronic pain, arthritis, migraines, and cancer.   -Provided with a pain relief journal and discussed that it contains foods and lifestyle tips to  naturally help to improve pain. Discussed that these lifestyle strategies are also very good for health unlike some medications which can have negative side effects. Discussed that the act of keeping a journal can be therapeutic and helpful to realize patterns what helps to trigger and alleviate pain.   -discussed with Dr. Joaquim Nam trigeminal deafferation pain, discussed the difference between this and trigeminal neuralgia with patient and husband, discussed motor cortex stimulation. -discussed whether or not to continue fentanyl patch, husband does feel that the IV fentanyl  -encouraged following with Dr. Sweet/Dr. Sabra Heck at [facility] to try procedure recommended by D. Ostegard.  -stop lamotrigine as it is making her dizzy without benefit -reviewed patient's medical notes -discussed her progress with getting a referral to [facility] -recommended going to ED given darkening of skin, worsening eye closure, finding of cyst, dizziness to receive prompt neurosurgical eval regarding if surgery is necessary, and also for pain management given worsening pain refractory to all treatments and resulting suicidality. Reviewed ED doc notes- he discussed with on-call neurology and NSGY and determined that patient could be discharged with outpatient follow-up. IV fentanyl was administered with relief to patient. She was discharged on fentanyl patch which has not provided relief. She is getting 87mcg- no side effects- discussed increasing to 18mcg and her husband is in agreement. Prior auth completed and approved. Discussed with husband starting patch on Satruday after she has worn 33mcg patch for 72 hours -encouraged follow-up with Dr. Joaquim Nam, husband let me know appointment has been set up 1/18.  -recommended follow-up with Dr. 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  -Discussed that turmeric  -discussed steroid taper. -continue savella, husband feels like it is helping with her suicidal thoughts. Increase to 25mg .  -continue hydrocodone.  -discuss mouth guard with dentist.  -d/c topamax -she does have another procedure scheduled. -discussed benefits of infrared light.  -Norco prescribed 5mg  TID PRN -follow-up monthly with Zella Ball and with me in February, placed on waitlist for earlier appointment with me, advised phone visits/MyChart are a great way to communicate in the interim -discussed that she can take an additional one or two tylenol for breakthrough pain -recommended checking CMP for liver enzymes with next labs -prescribed NAC 600mg  BID to protect the liver while taking tylenol, discussed its health benefits for mitochondrial function -d/c Baclofen  -f/u with Dr. Joaquim Nam and Dr. Davy Pique 1/18 -discussed retrying carbamazepine to see if it is effective for her. It is $89 for  her, so we discussed trying Lamictal instead.  -encouraged anti-inflammatory diet.  -she had sinusitis that was viral and was treated with antibiotics. This was in the early 74s. The symptoms resolved after 2 months. She uses to get sinus infections and saline rinse.  -She has had multiple root canals.  -Discontinue 100% oxygen via facemask as this was not helpful.  -Discussed that unfortunately Sprint PNS is not indicated for craniofacial pain. -Amitriptyline and Lyrica did not help.  -Continue Cymbalta 60mg .  -Topiramate helped but caused dizziness.  -Provided with hand out with information regarding trigeminal neuralgia -Failed Penytoin.  -trial lidocaine patch -She would like to try biofeedback. -reviewed neurology note.  -referred to Dr. Juel Burrow.  -will try trigger point injections  next visit.  -Continue Ketamine 10%, Baclofen 2%, Cyclobenzaprine 2%, Ketoprofen 10%, Gabapentin 6%, Bupivacaine 1%, Amitryptiline 5% to apply to painful areas- using 3-4 times per day.  -Can consider Meloxicam if Phenytoin does not help.  -Discussed Qutenza as an option for neuropathic pain control. Discussed that this is a capsaicin patch, stronger than capsaicin cream. Discussed that it is currently approved for diabetic peripheral neuropathy and post-herpetic neuralgia, but that it has also shown benefit in treating other forms of neuropathy. Provided patient with link to site to learn more about the patch: [employer].fr. Discussed that the patch would be placed in office and benefits usually last 3 months. Discussed that unintended exposure to capsaicin can cause severe irritation of eyes, mucous membranes, respiratory tract, and skin, but that Qutenza is a local treatment and does not have the systemic side effects of other nerve medications. Discussed that there may be pain, itching, erythema, and decreased sensory function associated with the application of Qutenza. Side effects usually subside within 1 week. A cold pack of analgesic medications can help with these side effects. Blood pressure can also be increased due to pain associated with administration of the patch.  -she cut down on her gabapentin due to her dizziness    Turmeric to reduce inflammation--can be used in cooking or taken as a supplement.  Benefits of turmeric:  -Highly anti-inflammatory  -Increases antioxidants  -Improves memory, attention, brain disease  -Lowers risk of heart disease  -May help prevent cancer  -Decreases pain  -Alleviates depression  -Delays aging and decreases risk of chronic disease  -Consume with black pepper to increase absorption    Turmeric Milk Recipe:  1 cup milk  1 tsp turmeric  1 tsp cinnamon  1 tsp grated ginger (optional)  Black pepper (boosts the  anti-inflammatory properties of turmeric).  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  -she is a good metabolizer of amitriptyline and has tolerated nortrypilline in the past, but without much benefit.    3) CKD: encouraged 6-8 glasses of water per day. She states that she had AKI with Ibuprofen in the past. Discussed checking Cr level next visit if we are considering Meloxicam -Discussed that Nucynta, and other pain  medications, may stay in her bloodstream longer given her CKD   4) Hypernatremia: encouraged hydration   5) General health and pain: provided with list of healthy foods that are both nutritious and fight pain.   6) Depressed: Amitriptyline did help with this but she could not tolerate the drowsiness. Encouraged focusing on the factors that she can control, such as anti-inflammatory diet.   7) Fibromyalgia: Continue 30mg  of steroids per day for fibromyalgia. She is on that intermittently. She started this again about a month ago. She has been on this about a year. Continue Savella  8) Dizziness: -discussed that this could be from the combination of gabapentin and baclofen.  -discussed that all the medications we are trying for pain may worsen the dizziness  9) Constipation:  -Provided list of following foods that help with constipation and highlighted a few: 1) prunes- contain high amounts of fiber.   2) apples- has a form of dietary fiber called pectin that accelerates stool movement and increases beneficial gut bacteria 3) pears- in addition to fiber, also high in fructose and sorbitol which have laxative effect 4) figs- contain an enzyme ficin which helps to speed colonic transit 5) kiwis- contain an enzyme actinidin that improves gut motility and reduces constipation 6) oranges- rich in pectin (like apples) 7) grapefruits- contain a flavanol naringenin which has a laxative effect 8) vegetables- rich in fiber and also great sources of folate, vitamin C, and K 9) artichoke- high in inulin, prebiotic great for the microbiome 10) chicory- increases stool frequency and softness (can be added to coffee) 11) rhubarb- laxative effect 12) sweet potato- high fiber 13) beans, peas, and lentils- contain both soluble and insoluble fiber 14) chia seeds- improves intestinal health and gut flora 15) flaxseeds- laxative effect 16) whole grain rye bread- high in fiber 17) oat bran- high in soluble and insoluble fiber 18) kefir- softens stools -recommended to try at least one of these foods every day.  -drink 6-8 glasses of water per day -walk regularly, especially after meals.   10) Osteopenia - was tapered off the prednisone -discussed that currently her pain may be more of a risk to her than osteopenia given her suicidal ideation from her pain -discussed risk of fall with the medications that make her dizziness.   6 minutes spent in discussion of her pain, her conversation with Dr. Davy Pique, agreed that she should restart fentanyl patch- recommended to cut 33mcg patch in half and apply- take 1 hydrocodone now for severe pain

## 2021-08-25 ENCOUNTER — Other Ambulatory Visit: Payer: Self-pay | Admitting: Physical Medicine and Rehabilitation

## 2021-08-25 MED ORDER — FENTANYL 25 MCG/HR TD PT72: 1.0000 | MEDICATED_PATCH | TRANSDERMAL | 0 refills | Status: DC

## 2021-08-26 ENCOUNTER — Encounter: Payer: Self-pay | Admitting: Pulmonary Disease

## 2021-08-26 ENCOUNTER — Ambulatory Visit (INDEPENDENT_AMBULATORY_CARE_PROVIDER_SITE_OTHER): Payer: Medicare Other | Admitting: Pulmonary Disease

## 2021-08-26 ENCOUNTER — Other Ambulatory Visit: Payer: Self-pay

## 2021-08-26 VITALS — BP 136/76 | HR 79 | Wt 151.0 lb

## 2021-08-26 DIAGNOSIS — R942 Abnormal results of pulmonary function studies: Secondary | ICD-10-CM | POA: Diagnosis not present

## 2021-08-26 DIAGNOSIS — R0602 Shortness of breath: Secondary | ICD-10-CM

## 2021-08-26 NOTE — Patient Instructions (Addendum)
Dyspnea on exertion ?Deconditioning ?--Encourage regular aerobic activity to improve endurance and staminan ?--On waitlist for Pulmonary rehab ? ?Reduced DLCO ?--No further work-up at this point. Patient will contact us if she wishes for echocardiogram in the future ?--PFTs in 2024 ? ?Follow-up with me in 6 months ?

## 2021-08-26 NOTE — Progress Notes (Signed)
Synopsis: Referred in 06/2018 for Reduced DLCO  Subjective:   PATIENT ID: [name] GENDER: female DOB: 12-13-1940, MRN: [id number]   HPI  Chief Complaint  Patient presents with   Follow-up    Sob not getting better or worse   Ms. Melia Hopes is a 81 year old female never smoker with fibromyalgia previously on 5 years low prednisone 2.5 mg, hx trigemina neuralgia s/p gamma knife who presents for follow-up.  Synopsis: Initially presented for consult in 06/2018 for gradually worsening dyspnea x 6 months that is aggravated with stairs and exercise. Previously played tennis in the spring and fall however since the pandemic no longer is active at baseline. PFTs in 07/2018 with moderately reduced gas exchange. Work-up included TTE with mild diastolic heart dysfunction and VQ scan negative for ventilation or perfusion defects. She was prescribed xoponex however did not tolerate due to tremors. She was lost to follow-up from 2020 until early 2023.  07/28/21 Since our last visit, she has not had any worsening shortness of breath. Occurs with walking up stairs. Has tried albuterol in the past that was ineffective. No longer play tennis or swims since COVID. Her husband is present and provides additional history. She has been in bed 18 hours a day on some days. She has been undergoing treatment for trigemina neuralgia that has been unsuccessful including chronic pain management. She has been referred to [facility].   08/26/21 Husband is present for this visit as well. She reports unchanged shortness of breath. She has not been doing steps since our last appointment. Her attention has recently been focused on management for her trigemina neuralgia and are planning for evaluation with [facility] soon.  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 Lived in [place] for 3 years, used coal heating  Allergies   Allergen Reactions   Dipyridamole Other (See Comments)    Migraines  Other reaction(s): migraine Persantine Other reaction(s): migraine   Diclofenac Sodium Other (See Comments)    Other reaction(s): hives Other reaction(s): hives   Hydrochlorothiazide Nausea Only    Chills Taking at this time lower dose   Levothyroxine Sodium     Other reaction(s): Unknown Other reaction(s): Unknown   Naproxen Other (See Comments)    Other reaction(s): worsens interstitial cystitis Other reaction(s): worsens interstitial cystitis   Other Other (See Comments)    Cat and Dog Dander  Other reaction(s): bladder pain Other reaction(s): muscle cramps Other reaction(s): Unknown   Oxycodone Hcl Other (See Comments)   Pantoprazole Sodium Other (See Comments)    Other reaction(s): increased tremors Other reaction(s): increased tremors   Triamterene Other (See Comments)    Made Tremors worse Other reaction(s): Other (See Comments)   Buprenorphine Other (See Comments)    "knocks me out" Other reaction(s): Unknown Butrans Other reaction(s): Unknown   Codeine Nausea Only and Other (See Comments)    Made her feel loopy Other reaction(s): nausea   Doxycycline Rash and Other (See Comments)    Other reaction(s): rash Other reaction(s): rash   Esomeprazole Other (See Comments)    Jittery, sore throat Other reaction(s): jittery, sore throat Other reaction(s): jittery, sore throat   Hydrochlorothiazide W-Triamterene Other (See Comments)    Muscle Cramps  Other reaction(s): muscle cramps   Irbesartan Diarrhea    Other reaction(s): loose stools Other reaction(s): loose stools Other reaction(s): loose stools   Lactose Intolerance (Gi) Other (See Comments)    GI upset  Lactulose Other (See Comments)    GI upset   Losartan Other (See Comments)    Hot and chills Other reaction(s): hot and chills Other reaction(s): hot and chills Other reaction(s): hot and chills   Nortriptyline Hcl Other  (See Comments)    "My body doesn't metabolize it anymore." Other reaction(s): Unknown Other reaction(s): Unknown Other reaction(s): Unknown   Oxycodone Other (See Comments)    Interstitial cystitis Bladder pain Other reaction(s): bladder pain   Oxycontin [Oxycodone Hcl] Other (See Comments)    Interstitial cystitis    Pennsaid [Diclofenac Sodium] Hives   Telmisartan Other (See Comments)    headache Other reaction(s): sinus pain, ha's Other reaction(s): sinus pain, ha's Other reaction(s): sinus pain, ha's   Tramadol Itching    sedation Other reaction(s): sedation/itching Other reaction(s): sedation/itching Other reaction(s): sedation/itching     Outpatient Medications Prior to Visit  Medication Sig Dispense Refill   naloxone (NARCAN) 0.4 MG/ML injection Use prn for opioid overdose 1 mL 3   Acetylcysteine (NAC) 600 MG CAPS Take 1 capsule (600 mg total) by mouth 2 (two) times daily. 60 capsule 0   benzocaine (ORAJEL) 10 % mucosal gel Use as directed 1 application in the mouth or throat as needed for mouth pain. 5.3 g 0   calcium carbonate (OS-CAL) 600 MG TABS tablet Take 600 mg by mouth daily with breakfast.     cetirizine (ZYRTEC) 10 MG tablet Take 10 mg by mouth daily.     Cholecalciferol (VITAMIN D-3) 25 MCG (1000 UT) CAPS Take 2,000 Units by mouth daily.     cloNIDine (CATAPRES - DOSED IN MG/24 HR) 0.1 mg/24hr patch Place 1 patch (0.1 mg total) onto the skin once a week. 4 patch 12   Cyanocobalamin (VITAMIN B-12) 2500 MCG SUBL Place 2,500 mcg under the tongue daily.     denosumab (PROLIA) 60 MG/ML SOSY injection Inject 60 mg into the skin every 6 (six) months.     DULoxetine (CYMBALTA) 30 MG capsule TAKE 1 CAPSULE TWICE A DAY (Patient taking differently: 60 mg daily.) 180 capsule 3   fentaNYL (DURAGESIC) 25 MCG/HR Place 1 patch onto the skin every 3 (three) days. 5 patch 0   fluticasone (FLONASE) 50 MCG/ACT nasal spray Place 1 spray into both nostrils daily.     gabapentin  (NEURONTIN) 100 MG capsule Take 200 mg by mouth 3 (three) times daily.     gabapentin (NEURONTIN) 300 MG capsule Take 1 capsule (300 mg total) by mouth every 4 (four) hours as needed. (Patient not taking: Reported on 07/31/2021) 540 capsule 3   lamoTRIgine (LAMICTAL) 150 MG tablet Take 1 tablet (150 mg total) by mouth daily. 60 tablet 3   levothyroxine (SYNTHROID) 75 MCG tablet Take 75 mcg by mouth daily before breakfast.     Milnacipran HCl (SAVELLA) 25 MG TABS Take 1 tablet (25 mg total) by mouth at bedtime. 25 tablet 3   NONFORMULARY OR COMPOUNDED ITEM Apply 1 Pump topically 4 (four) times daily as needed. Ketamine 10%, Baclofen 2%, Cyclobenzaprine 2%, Ketoprofen 10%, Gabapentin 6%, Bupivacaine 1%, Amitryptiline 5% Dispense '100mg'$  100 each 3   olmesartan (BENICAR) 40 MG tablet Take 40 mg by mouth daily.     ondansetron (ZOFRAN) 4 MG tablet Take 4 mg by mouth 2 (two) times daily as needed for nausea/vomiting.     pantoprazole (PROTONIX) 40 MG tablet Take 1 tablet by mouth daily.     predniSONE (DELTASONE) 1 MG tablet 4tablet     predniSONE (DELTASONE) 10  MG tablet Take 1 tablet (10 mg total) by mouth daily with breakfast. 21 tablet 0   PREMARIN vaginal cream Place 1 applicator vaginally 3 (three) times a week.     Pyridoxine HCl (VITAMIN B-6 PO) Take 1 tablet by mouth daily.     No facility-administered medications prior to visit.    Review of Systems  Constitutional:  Positive for malaise/fatigue. Negative for chills, diaphoresis, fever and weight loss.  HENT:  Negative for congestion.   Respiratory:  Positive for shortness of breath. Negative for cough, hemoptysis, sputum production and wheezing.   Cardiovascular:  Negative for chest pain, palpitations and leg swelling.    Objective:   Vitals:   08/26/21 1132  BP: 136/76  Pulse: 79  SpO2: 96%  Weight: 151 lb (68.5 kg)      Physical Exam: General: Elderly and frail-appearing, no acute distress HENT: [place], AT Eyes: EOMI, no  scleral icterus Respiratory: Clear to auscultation bilaterally.  No crackles, wheezing or rales Cardiovascular: RRR, -M/R/G, no JVD Extremities:-Edema,-tenderness Neuro: AAO x4, CNII-XII grossly intact Psych: Normal mood, normal affect  Data Imaging CXR 07/15/18 - no infiltrate, edema or effusion. VQ scan 07/15/18 - no V/Q mismatch HRCT Chest 08/20/21 - Biapical pleuroparenchymal scarring. No evidence of ILD. No significant GGO, septal thickening, subpleural reticulation, traction bronchiectasis or honeycombing present. Calcified mediastinal and hilar lymph nodes.  Calcified splenic artery aneurysm.  PFT 07/04/18 FVC 2.77 (107%) FEV1 2.27 (118%) Ratio 82  TLC 96% DLCO 61% Interpretation: Normal spirometry. Mildly reduced DLCO  07/28/21 FVC 2.6 (106%) FEV1 2.23 (123%) Ratio 84 TLC 90% DLCO 65% Interpretation: No obstructive and restrictive defect. Mildly reduced DLCO  TTE 07/05/18 Normal EF 55-60%. Mild diastolic dysfunction, mild TR, mild PH  Labs CBC 07/01/18 reviewed. Hg appropriate. 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 Unclear cause of her reduced DLCO. CT Chest reviewed with no evidence of early ILD and recent labs neg for anemia. Low suspicion for pulmonary vascular disease however can re-evaluate with echocardiogram. We discussed utility in further testing  and after discussion she wishes to hold and working on strength at this point. Counseled on regular activity. No benefit from inhalers and unable to tolerate beta-agonists due to tremors.  Dyspnea on exertion Deconditioning --Encourage regular aerobic activity to improve endurance and staminan --On waitlist for Pulmonary rehab  Reduced DLCO --No further work-up. Patient will contact us if she wishes for echocardiogram in the future  No orders of the defined types were placed in this encounter.  No orders of the defined types were placed in this encounter.  Return in about 6 months (around 02/26/2022).   I have spent a total time of 33-minutes on the day of the appointment reviewing prior documentation, coordinating care and discussing medical diagnosis and plan with the  patient/family. Past medical history, allergies, medications were reviewed. Pertinent imaging, labs and tests included in this note have been reviewed and interpreted independently by me.  Shasta Chinn Rodman Pickle, MD [facility] 08/26/2021 8:41 AM

## 2021-08-27 ENCOUNTER — Encounter: Payer: Self-pay | Admitting: Pulmonary Disease

## 2021-08-27 ENCOUNTER — Other Ambulatory Visit: Payer: Self-pay | Admitting: Physical Medicine and Rehabilitation

## 2021-08-27 MED ORDER — HYDROCODONE-ACETAMINOPHEN 5-325 MG PO TABS: 1.0000 | ORAL_TABLET | Freq: Four times a day (QID) | ORAL | 0 refills | Status: DC | PRN

## 2021-09-08 DIAGNOSIS — M810 Age-related osteoporosis without current pathological fracture: Secondary | ICD-10-CM | POA: Diagnosis not present

## 2021-09-15 DIAGNOSIS — R220 Localized swelling, mass and lump, head: Secondary | ICD-10-CM | POA: Diagnosis not present

## 2021-09-15 DIAGNOSIS — I10 Essential (primary) hypertension: Secondary | ICD-10-CM | POA: Diagnosis not present

## 2021-09-15 DIAGNOSIS — R441 Visual hallucinations: Secondary | ICD-10-CM | POA: Diagnosis not present

## 2021-09-15 DIAGNOSIS — R410 Disorientation, unspecified: Secondary | ICD-10-CM | POA: Diagnosis not present

## 2021-09-23 ENCOUNTER — Encounter: Payer: Medicare Other | Attending: Physical Medicine and Rehabilitation | Admitting: Registered Nurse

## 2021-09-23 ENCOUNTER — Encounter: Payer: Self-pay | Admitting: Registered Nurse

## 2021-09-23 VITALS — BP 152/67 | HR 70 | Ht 63.0 in | Wt 149.4 lb

## 2021-09-23 DIAGNOSIS — G894 Chronic pain syndrome: Secondary | ICD-10-CM | POA: Insufficient documentation

## 2021-09-23 DIAGNOSIS — G500 Trigeminal neuralgia: Secondary | ICD-10-CM | POA: Diagnosis not present

## 2021-09-23 DIAGNOSIS — Z5181 Encounter for therapeutic drug level monitoring: Secondary | ICD-10-CM | POA: Insufficient documentation

## 2021-09-23 DIAGNOSIS — M797 Fibromyalgia: Secondary | ICD-10-CM | POA: Insufficient documentation

## 2021-09-23 DIAGNOSIS — G9689 Other specified disorders of central nervous system: Secondary | ICD-10-CM | POA: Diagnosis not present

## 2021-09-23 DIAGNOSIS — I10 Essential (primary) hypertension: Secondary | ICD-10-CM | POA: Diagnosis not present

## 2021-09-23 DIAGNOSIS — Z79891 Long term (current) use of opiate analgesic: Secondary | ICD-10-CM | POA: Insufficient documentation

## 2021-09-23 DIAGNOSIS — R441 Visual hallucinations: Secondary | ICD-10-CM | POA: Diagnosis not present

## 2021-09-23 DIAGNOSIS — K219 Gastro-esophageal reflux disease without esophagitis: Secondary | ICD-10-CM | POA: Diagnosis not present

## 2021-09-23 MED ORDER — FENTANYL 12 MCG/HR TD PT72: 1.0000 | MEDICATED_PATCH | TRANSDERMAL | 0 refills | Status: DC

## 2021-09-23 NOTE — Progress Notes (Signed)
? ?Subjective:  ? ? Patient ID: [name], female    DOB: 03-Mar-1941, 81 y.o.   MRN: [id number] ? ?HPI: [name] is a 81 y.o. female who returns for follow up appointment for chronic pain and medication refill. She states her pain is located in her left eye and left facial pain. She rates her pain 10. Her current exercise regime is walking short distances.  ? ?Mr. And Mrs. Sells states they are cutting their Fentanyl patches as instructed by Dr Ranell Patrick, pharmacist was called regarding the above. Spoke with Mr. And Mrs Fesperman  regarding the above. They verbalize understanding. They refused to destroy the remaining Fentanyl patches due to frequent doses changes, this will be discussed with Dr Ranell Patrick, they verbalize understanding. Fentanyl  12 mcg e-scribed today, they verbalize understanding.  ? ?Ms. Capes Morphine equivalent is 48.80 MME.   Oral Swab was Performed today.  ?  ? ?Pain Inventory ?Average Pain 10 ?Pain Right Now 10 ?My pain is burning, dull, and aching ? ?In the last 24 hours, has pain interfered with the following? ?General activity 7 ?Relation with others 3 ?Enjoyment of life 5 ?What TIME of day is your pain at its worst? morning  ?Sleep (in general) Good ? ?Pain is worse with: bending ?Pain improves with: medication ?Relief from Meds: 5 ? ?Family History  ?Problem Relation Age of Onset  ? Heart disease Mother   ? Heart disease Father   ? Stroke Father   ? Stroke Sister   ? Asthma Daughter   ? Colon cancer Neg Hx   ? ?Social History  ? ?Socioeconomic History  ? Marital status: Married  ?  Spouse name: Not on file  ? Number of children: 2  ? Years of education: college  ? Highest education level: Not on file  ?Occupational History  ? Occupation: housewife  ?  Employer: RETIRED  ?Tobacco Use  ? Smoking status: Never  ? Smokeless tobacco: Never  ?Vaping Use  ? Vaping Use: Never used  ?Substance and Sexual Activity  ? Alcohol use: No  ?  Alcohol/week: 0.0 standard drinks  ? Drug use: No   ? Sexual activity: Not on file  ?Other Topics Concern  ? Not on file  ?Social History Narrative  ? 1 cup of Jasmine tea daily   ? ?Social Determinants of Health  ? ?Financial Resource Strain: Not on file  ?Food Insecurity: Not on file  ?Transportation Needs: Not on file  ?Physical Activity: Not on file  ?Stress: Not on file  ?Social Connections: Not on file  ? ?Past Surgical History:  ?Procedure Laterality Date  ? CHOLECYSTECTOMY  03/2010  ? COLONOSCOPY  2009  ? Dr. Raford Pitcher   ? CYSTOSCOPY    ? Multiple Cystoscopies with stents or biopsy   ? ESOPHAGOGASTRODUODENOSCOPY  10/2013  ? EYE SURGERY    ? bilateral cataract removal  ? Gamma knife radiation  01/2018  ? for trigeminal neuralgia  ? REFRACTIVE SURGERY Bilateral 2022  ? RHIZOTOMY Left 04/28/2021  ? Procedure: Left Percutaneous trigeminal balloon rhizotomy;  Surgeon: Judith Part, MD;  Location: [facility];  Service: Neurosurgery;  Laterality: Left;  ? TONSILLECTOMY    ? ?Past Surgical History:  ?Procedure Laterality Date  ? CHOLECYSTECTOMY  03/2010  ? COLONOSCOPY  2009  ? Dr. Raford Pitcher   ? CYSTOSCOPY    ? Multiple Cystoscopies with stents or biopsy   ? ESOPHAGOGASTRODUODENOSCOPY  10/2013  ? EYE SURGERY    ? bilateral  cataract removal  ? Gamma knife radiation  01/2018  ? for trigeminal neuralgia  ? REFRACTIVE SURGERY Bilateral 2022  ? RHIZOTOMY Left 04/28/2021  ? Procedure: Left Percutaneous trigeminal balloon rhizotomy;  Surgeon: Judith Part, MD;  Location: [facility];  Service: Neurosurgery;  Laterality: Left;  ? TONSILLECTOMY    ? ?Past Medical History:  ?Diagnosis Date  ? Adenomatous colon polyp 03/24/2018  ? Allergy   ? Arthritis   ? Cataract   ? Chronic headaches   ? Depression   ? Fibromyalgia   ? Gallstones   ? GERD (gastroesophageal reflux disease)   ? Hyperlipemia   ? Hypertension   ? Interstitial cystitis   ? Lactose intolerance   ? LBBB (left bundle branch block)   ? Osteoarthritis   ? Osteopenia   ? Pneumonia   ? Thyroid disease   ?  Tinnitus   ? Trigeminal neuralgia   ? ?BP (!) 152/67   Pulse 70   Ht '5\' 3"'$  (1.6 m)   Wt 149 lb 6.4 oz (67.8 kg)   SpO2 97%   BMI 26.47 kg/m?  ? ?Opioid Risk Score:   ?Fall Risk Score:  `1 ? ?Depression screen PHQ 2/9 ? ? ?  09/23/2021  ?  1:56 PM 07/31/2021  ? 11:54 AM 07/08/2021  ?  3:05 PM 06/02/2021  ? 10:41 AM 05/06/2021  ? 11:06 AM 10/28/2020  ?  1:24 PM 06/26/2020  ? 11:15 AM  ?Depression screen PHQ 2/9  ?Decreased Interest 0 0 0 1 0 0 0  ?Down, Depressed, Hopeless 1 0 0 1  0 0  ?PHQ - 2 Score 1 0 0 2 0 0 0  ?Altered sleeping     0    ?PHQ-9 Score     0    ?  ? ?Review of Systems  ?Constitutional: Negative.   ?HENT: Negative.    ?Eyes: Negative.   ?Respiratory: Negative.    ?Cardiovascular: Negative.   ?Gastrointestinal: Negative.   ?Endocrine: Negative.   ?Genitourinary: Negative.   ?Musculoskeletal: Negative.   ?Skin: Negative.   ?Allergic/Immunologic: Negative.   ?Neurological:  Positive for headaches.  ?Hematological: Negative.   ?Psychiatric/Behavioral: Negative.    ? ?   ?Objective:  ? Physical Exam ?Vitals and nursing note reviewed.  ?Constitutional:   ?   Appearance: Normal appearance.  ?Cardiovascular:  ?   Rate and Rhythm: Normal rate and regular rhythm.  ?   Pulses: Normal pulses.  ?   Heart sounds: Normal heart sounds.  ?Pulmonary:  ?   Effort: Pulmonary effort is normal.  ?   Breath sounds: Normal breath sounds.  ?Musculoskeletal:  ?   Cervical back: Normal range of motion and neck supple.  ?   Comments: Normal Muscle Bulk and Muscle Testing Reveals:  ?Upper Extremities: Full ROM and Muscle Strength 5/5 ?Lower Extremities: Full ROM and Muscle Strength 5/5 ?Arises from Table slowly  ?Narrow Based  Gait  ?   ?Skin: ?   General: Skin is warm and dry.  ?Neurological:  ?   Mental Status: She is alert and oriented to person, place, and time.  ?Psychiatric:     ?   Mood and Affect: Mood normal.     ?   Behavior: Behavior normal.  ? ? ? ? ?   ?Assessment & Plan:  ?Right Lumbar Radiculitis: Continue  Gabapentin. Continue HEP as Tolerated. Continue to Monitor. 09/23/2021 ?Trigeminal Neuralgia: Continue Gabapentin. Continue to Monitor. 09/23/2021 ?Fibromyalgia: Continue HEP as Tolerated. Continue  Gabapentin. Continue to Monitor.  ?Chronic Pain Syndrome: Sensory Deaffernation Syndrome: Refilled: Fentanyl 12 MCG one  patch every three days #10 and Hydrocodone '5mg'$ /325 mg one tablet 4 times a day as needed for pain #120. We will continue the opioid monitoring program, this consists of regular clinic visits, examinations, urine drug screen, pill counts as well as use of [place] Controlled Substance Reporting system. A 12 month History has been reviewed on the [place] Controlled Substance Reporting System on 0404//2023 ? ?F/U in 1 month   ? ? ? ? ? ? ? ?

## 2021-09-26 ENCOUNTER — Telehealth: Payer: Self-pay | Admitting: Registered Nurse

## 2021-09-26 NOTE — Telephone Encounter (Signed)
Dr Ranell Patrick, ?Ms. Greis was seen on 09/23/2021, they wouldn't discard her other fentanyl patches dosages. Mr. And Mrs Galiano states you have been changing their dosage, so they wanted to hang  unto the Fentany patches.  ?Just a FYI.  ?

## 2021-09-29 DIAGNOSIS — I10 Essential (primary) hypertension: Secondary | ICD-10-CM | POA: Diagnosis not present

## 2021-09-29 DIAGNOSIS — G500 Trigeminal neuralgia: Secondary | ICD-10-CM | POA: Diagnosis not present

## 2021-09-29 LAB — DRUG TOX MONITOR 1 W/CONF, ORAL FLD
Amphetamines: NEGATIVE ng/mL (ref <10)
Barbiturates: NEGATIVE ng/mL (ref <10)
Benzodiazepines: NEGATIVE ng/mL (ref <0.50)
Buprenorphine: NEGATIVE ng/mL (ref <0.10)
Cocaine: NEGATIVE ng/mL (ref <5.0)
Codeine: NEGATIVE ng/mL (ref <2.5)
Dihydrocodeine: 9.3 ng/mL — ABNORMAL HIGH (ref <2.5)
Fentanyl: 0.86 ng/mL — ABNORMAL HIGH (ref <0.10)
Fentanyl: POSITIVE ng/mL — AB (ref <0.10)
Heroin Metabolite: NEGATIVE ng/mL (ref <1.0)
Hydrocodone: 56.7 ng/mL — ABNORMAL HIGH (ref <2.5)
Hydromorphone: NEGATIVE ng/mL (ref <2.5)
MARIJUANA: NEGATIVE ng/mL (ref <2.5)
MDMA: NEGATIVE ng/mL (ref <10)
Meprobamate: NEGATIVE ng/mL (ref <2.5)
Methadone: NEGATIVE ng/mL (ref <5.0)
Morphine: NEGATIVE ng/mL (ref <2.5)
Nicotine Metabolite: NEGATIVE ng/mL (ref <5.0)
Norhydrocodone: 5.1 ng/mL — ABNORMAL HIGH (ref <2.5)
Noroxycodone: NEGATIVE ng/mL (ref <2.5)
Opiates: POSITIVE ng/mL — AB (ref <2.5)
Oxycodone: NEGATIVE ng/mL (ref <2.5)
Oxymorphone: NEGATIVE ng/mL (ref <2.5)
Phencyclidine: NEGATIVE ng/mL (ref <10)
Tapentadol: NEGATIVE ng/mL (ref <5.0)
Tramadol: NEGATIVE ng/mL (ref <5.0)
Zolpidem: NEGATIVE ng/mL (ref <5.0)

## 2021-09-29 LAB — DRUG TOX ALC METAB W/CON, ORAL FLD: Alcohol Metabolite: NEGATIVE ng/mL (ref <25)

## 2021-09-30 ENCOUNTER — Telehealth: Payer: Self-pay | Admitting: *Deleted

## 2021-09-30 NOTE — Telephone Encounter (Signed)
Oral swab drug screen was consistent for prescribed medications.  ?

## 2021-10-02 ENCOUNTER — Encounter: Payer: Self-pay | Admitting: Physical Medicine and Rehabilitation

## 2021-10-03 ENCOUNTER — Encounter (HOSPITAL_BASED_OUTPATIENT_CLINIC_OR_DEPARTMENT_OTHER): Payer: Medicare Other | Admitting: Physical Medicine and Rehabilitation

## 2021-10-03 DIAGNOSIS — G9689 Other specified disorders of central nervous system: Secondary | ICD-10-CM | POA: Diagnosis not present

## 2021-10-04 NOTE — Progress Notes (Signed)
? ?Subjective:  ? ? Patient ID: [name], female    DOB: 1940/09/30, 81 y.o.   MRN: [id number] ? ?HPI  ?An audio/video tele-health visit is felt to be the most appropriate encounter for this patient at this time. This is a follow up tele-visit via phone. The patient is at home. MD is at office. Prior to scheduling this appointment, our staff discussed the limitations of evaluation and management by telemedicine and the availability of in-person appointments. The patient expressed understanding and agreed to proceed.  ? ?[name] presents today for follow-up of severe trigeminal neuralgia.  ? ?1) Severe trigeminal neuralgia: ?-she is still not doing well. ?-she had a recent root canal and pain has become much more severe after that ?-her pain is in the two teeth next to where she had the root canal ?-Oragel heps for a bout an hour.  ?-she tried increasing her Gabapentin to '300mg'$  TID but she could not tolerate this. It made her too sleepy.  ?-she contacted Dr. Davy Pique and he recommended she pause wean of Fentanyl patch and I agree with this ?-she took last patch last night. She still has 12.42mg patch available ?-her husband asks whether she should take a higher dose of gabapentin ?-the fentanyl patch makes her very sleepy and so allows her to sleep. It has also made her dizzier.  ?-the 520m fentanyl patch did not provide more relief- she would like to wean off. Weaned to 25, then 12.24m74m then off this week ?-[facility] and not to Dr. WolJuel Burrowe states and they need a new referral send to Dr. LieVan Clinesshe is using the compound cream and this helps- she uses this twice per day ?-capsaicin made it burn  ?-lamotrigine makes her too sleepy and dizzy to come down the stairs by herself and she does not feel comfortable going to the shower ?-left eye more open than usual. ?-she does not feel she is getting relief from the fentanyl patch ?-she feels that the procedure at [facility] is a last resort.  ?-Given darkening of skin and worsening eye lid closure, MRI brain stat obtained and shows new cyst that could be post-surgical. Recommended she go to ED given worsening dizziness and facial swelling as well. ED physician discussed case with NSGY and neurology and discharged home recommending follow-up with Dr. EicGean Quintd Dr. OstMerceda Elkshe was given IV Fentanyl in the ED which helped and discharged on 22m41mentanyl patch, which has not helped.  ?-discussed increasing Fentanyl patch to 224mc63mnce she has not experienced any negative side effects, no respiratory depression, and her husband is agreeable. I have sent in new script to start Saturday, but insurance required prior auth, which was approved.  ?-tonight would be the third tay of the 224mcg73mtanyl patch ?-she is still taking the Gabapentin ?-she feels that when she lays down she is spinning.  ?-eye continues to appear more closed.  ?-Husband has called Dr. EichmaDionne Anor. OstegaEarlie Ravelinge to scheduled follow-up appointments and has appointments with both tomorrow.  ?-Will run out of Fentanyl patch by Sunday ?-pain is worsening and nothing is providing relief ?-she asks whether she should go to ED ?-she has also noted dizziness and is not sure if this is from being in the MRI or from the [place]ine was not effective and caused itching, sarna lotion did help wit this.  ?-hydrocodone has been most effective thus far ?-nucynta was not effective but she  is not sure whether she gave it a fair trial ?-she has month appointments scheduled with Zella Ball and f/u with me in Feb ?-she used the Norco last night and this morning with good benefit and has started to feel the pain return this afternoon ?-she did find benefit from the compound cream- she continues to use this.  ?-She continues to have a sharp shooting pain doing around her eyelid and the tip teeth and in the top of her head.  ?-has a radiofrequency ablation  treatment that helped but not completely ?-she is not having as much sharp knife-like pin like in her eye ?Her top teeth in her left law have been hurting really badly.  ?-she now takes the baclofen with the gabapentin and they seem to work well together.  ?-she had surgical procedure and feels worse ?-yesterday she took a half a tablet but this morning she took a whole one. She does fine with the hydrocodone but a whole one makes her too sleepy.  ?-she is taking 2 '100mg'$  Gabapentin three times per day but it makes her dizzy and sleepy so she cannot funciton ?-the cold worsens her pain.  ?-The IV fentanyl did help with the pain.  ?-her husband feels she should get one more dose of the '25mg'$  fentanyl. ?-she did get one week benefit from the steroid injection.  ?-she is doing terribly ?-she is miserable ?-she feels depressed due to the pain ?-she has considered taking the whole bottle of hydrocodone because because she can't take this pain anymore.  ?-she started taking the Gabapentin '300mg'$  TID.  ?-she does find relief from the hydrocodone ?-the pain is constant ?-she wonders if it is inflammation.  ?-she has not tried turmeric. Her son-in-law gave her a bottle and she couldn't stomach it.  ? ?2) Fibromyalgia: ?-She has been almost nonfunctional due to the severity of her pain. It continues to be very severe.  ?-She would like to wean off the Gabapentin as she feels at risk of falls ?-she is taking Topamax BID. She felt this was helpful but made her unsteady on her feet.  ? ?3) Osteoporosis: ?-husband asks whether she should get a Prolia shot.  ? ?4) Dizziness: ?-head was swimming ?-she felt like she was going to fall.  ?-worsening ?-feels like she is spinning, not the room ? ?-She tried the Phenytoin for 2 days and did not feel benefit so stopped it.  ? ?-she feels the gabapentin aggravates this and she would love to wean off but knows it helps her.  ? ?-She feels very sleepy during the day.  ? ?-She has never tried  Topirmate before.  ? ?5) Sore throat ?-developed stopping lamotrigine since not helping.  ? ?Prior history:  ?Her pain continues to be in her left eye, forehead. She has not tried any topicals for pain. She uses capsaicin for her back and knees.  ? ?Since last visit nothing we have tried has helped with her pain. She has tried to decrease her Gabapentin dose but then her pain increased so she has resumed taking 4 Gabapentin per day.  ? ?Her husband asks whether he should contact her surgeon regarding whether nerve may have become unclamped.  ? ?Discussed prolotherapy, trigger point injections, Baclofen, Clonazepam, Phenytoin as other options we could try,=. ? ?Pain continues to be excruciating and she states she is not sure how much longer she can live like this.  ? ?Prior history: ?She has having some nausea, lightheadedness and felt this  may be related to her Gabapentin. She did not take any yesterday and then felt more sever pain in the evening. She also gets electrical shooting across her jaw.  ? ?She does not feel that she has cluster headaches as the pain has been so constant. She feels that she is getting worse. The oxygen made her feel relaxed but it is not helping.  ? ?The Sarna lotion does help with the itching in her head.  ? ?She was willing to try the Amitriptyline but it makes her too sleepy in the morning.  ? ?Her teeth and left eye incredibly hurt.  ? ?She feels that the Gabapentin has been making her dizziness.  ? ?She asks about biofeedback.  ? ?The pain is 7/10 on average, similar to last time.  ? ?When the pain is severe it can make her depressed.  ? ?Pain Inventory ?Pain Right Now 10 ?My pain is constant, sharp, burning, dull, stabbing, tingling, and aching ? ?In the last 24 hours, has pain interfered with the following? ?General activity 10 ?Relation with others 7 ?Enjoyment of life 8 ?What TIME of day is your pain at its worst? varies ?Sleep (in general) Good ? ?Pain is worse with:  bending ?Pain improves with: rest and medication ?Relief from Meds: unknown ? ?Family History  ?Problem Relation Age of Onset  ? Heart disease Mother   ? Heart disease Father   ? Stroke Father   ? Stroke Sister   ? Asthma Daugh

## 2021-10-13 ENCOUNTER — Encounter: Payer: Self-pay | Admitting: Physical Medicine and Rehabilitation

## 2021-10-14 ENCOUNTER — Other Ambulatory Visit: Payer: Self-pay | Admitting: Physical Medicine and Rehabilitation

## 2021-10-14 MED ORDER — FENTANYL 25 MCG/HR TD PT72: 1.0000 | MEDICATED_PATCH | TRANSDERMAL | 0 refills | Status: DC

## 2021-10-14 NOTE — Telephone Encounter (Signed)
Spoke with husband and he just needs refill on Fentanyl '25mg'$  sent to [facility] [address] in [place], her [place] doesn't have it. ?

## 2021-10-16 ENCOUNTER — Telehealth (HOSPITAL_COMMUNITY): Payer: Self-pay | Admitting: *Deleted

## 2021-10-16 ENCOUNTER — Telehealth (HOSPITAL_COMMUNITY): Payer: Self-pay

## 2021-10-16 NOTE — Telephone Encounter (Signed)
Called [name] to confirm her PF orientation appointment. She states that she will be coming. We discussed Covid precautions, proper shoes, mask, directions to the department and our contact number. She and her husband voice understanding. ?

## 2021-10-16 NOTE — Telephone Encounter (Signed)
Called patient to see if she was interested in participating in the Pulmonary Rehab Program. Patient stated yes. Patient will come in for orientation on 10/20/21 @ 10:30AM and will attend the 1:15PM exercise class. ?  ?Tourist information centre manager.  ?

## 2021-10-20 ENCOUNTER — Encounter: Payer: Self-pay | Admitting: Registered Nurse

## 2021-10-20 ENCOUNTER — Encounter (HOSPITAL_COMMUNITY): Payer: Self-pay

## 2021-10-20 ENCOUNTER — Encounter (HOSPITAL_BASED_OUTPATIENT_CLINIC_OR_DEPARTMENT_OTHER): Payer: Medicare Other | Admitting: Registered Nurse

## 2021-10-20 ENCOUNTER — Encounter (HOSPITAL_COMMUNITY)
Admission: RE | Admit: 2021-10-20 | Discharge: 2021-10-20 | Disposition: A | Discharge status: Home / Self Care | Payer: Medicare Other | Source: Ambulatory Visit | Attending: Pulmonary Disease | Admitting: Pulmonary Disease

## 2021-10-20 VITALS — BP 144/83 | HR 70 | Ht 63.0 in | Wt 151.6 lb

## 2021-10-20 VITALS — BP 134/62 | HR 71 | Ht 63.0 in | Wt 150.4 lb

## 2021-10-20 DIAGNOSIS — M797 Fibromyalgia: Secondary | ICD-10-CM | POA: Insufficient documentation

## 2021-10-20 DIAGNOSIS — R942 Abnormal results of pulmonary function studies: Secondary | ICD-10-CM | POA: Insufficient documentation

## 2021-10-20 DIAGNOSIS — G9689 Other specified disorders of central nervous system: Secondary | ICD-10-CM | POA: Insufficient documentation

## 2021-10-20 DIAGNOSIS — R0602 Shortness of breath: Secondary | ICD-10-CM | POA: Diagnosis not present

## 2021-10-20 DIAGNOSIS — Z5181 Encounter for therapeutic drug level monitoring: Secondary | ICD-10-CM | POA: Insufficient documentation

## 2021-10-20 DIAGNOSIS — G894 Chronic pain syndrome: Secondary | ICD-10-CM | POA: Insufficient documentation

## 2021-10-20 DIAGNOSIS — G500 Trigeminal neuralgia: Secondary | ICD-10-CM | POA: Insufficient documentation

## 2021-10-20 DIAGNOSIS — Z79891 Long term (current) use of opiate analgesic: Secondary | ICD-10-CM | POA: Insufficient documentation

## 2021-10-20 MED ORDER — HYDROCODONE-ACETAMINOPHEN 5-325 MG PO TABS: 1.0000 | ORAL_TABLET | Freq: Four times a day (QID) | ORAL | 0 refills | Status: DC | PRN

## 2021-10-20 MED ORDER — FENTANYL 25 MCG/HR TD PT72: 1.0000 | MEDICATED_PATCH | TRANSDERMAL | 0 refills | Status: DC

## 2021-10-20 NOTE — Progress Notes (Signed)
[name] 81 y.o. female ?Pulmonary Rehab Orientation Note ?This patient who was referred to Pulmonary Rehab by Dr. Loanne Drilling with the diagnosis of Decreased diffusion capacity of the lung; SOB arrived today in Cardiac and Pulmonary Rehab. She  arrived with ambulatory normal gait. She  does not carry portable oxygen. Per pt, [name] uses oxygen never. Color good, skin warm and dry. Patient is oriented to time and place. Patient's medical history, psychosocial health, and medications reviewed. Psychosocial assessment reveals pt lives with spouse. [name] is currently retired. Pt hobbies include watching tv, spending time with others, and sewing. Pt reports her stress level is low. She does not report any stressors. Pt does exhibit signs of depression. Pt is on medication for her depression. PHQ2/9 score 3/6. [name] shows good  coping skills with positive outlook on life. Offered emotional support and reassurance. Will continue to monitor. Physical assessment performed by Maurice Small, RN. Please see their orientation physical assessment note. [name] reports she does take medications as prescribed. Patient states she follows a regular  diet. The patient reports no specific efforts to gain or lose weight.. Pt's weight will be monitored closely. Demonstration and practice of PLB using pulse oximeter. [name] able to return demonstration satisfactorily. Safety and hand hygiene in the exercise area reviewed with patient. [name] voices understanding of the information reviewed. Department expectations discussed with patient and achievable goals were set. The patient shows enthusiasm about attending the program and we look forward to working with [name]. [name] completed a 6 min walk test today and is scheduled to begin exercise on 09/28/21 at 1:15 pm.  ? ?1020-1200 ?Sheppard Plumber, MS, ACSM-CEP ?  ?

## 2021-10-20 NOTE — Progress Notes (Addendum)
Pulmonary Individual Treatment Plan ? ?Patient Details  ?Name: [name] ?MRN: [id number] ?Date of Birth: 09-02-1940 ?Referring Provider:   ?Flowsheet Row Pulmonary Rehab Walk Test from 10/20/2021 in [place]  ?Referring Provider Loanne Drilling  ? ?  ? ? ?Initial Encounter Date:  ?Flowsheet Row Pulmonary Rehab Walk Test from 10/20/2021 in [place]  ?Date 10/20/21  ? ?  ? ? ?Visit Diagnosis: Diffusion capacity of lung (dl), decreased ? ?Shortness of breath ? ?Patient's Home Medications on Admission:  ? ?Current Outpatient Medications:  ?  acetaminophen (TYLENOL) 500 MG tablet, Take 500 mg by mouth 3 (three) times daily., Disp: , Rfl:  ?  Acetylcysteine (NAC) 600 MG CAPS, Take 1 capsule (600 mg total) by mouth 2 (two) times daily., Disp: 60 capsule, Rfl: 0 ?  amLODipine (NORVASC) 5 MG tablet, Take 5 mg by mouth daily., Disp: , Rfl:  ?  benzocaine (ORAJEL) 10 % mucosal gel, Use as directed 1 application in the mouth or throat as needed for mouth pain., Disp: 5.3 g, Rfl: 0 ?  calcium carbonate (OS-CAL) 600 MG TABS tablet, Take 600 mg by mouth daily with breakfast., Disp: , Rfl:  ?  cetirizine (ZYRTEC) 10 MG tablet, Take 10 mg by mouth daily., Disp: , Rfl:  ?  Cholecalciferol (VITAMIN D-3) 25 MCG (1000 UT) CAPS, Take 2,000 Units by mouth daily., Disp: , Rfl:  ?  Cyanocobalamin (VITAMIN B-12) 2500 MCG SUBL, Place 2,500 mcg under the tongue daily., Disp: , Rfl:  ?  denosumab (PROLIA) 60 MG/ML SOSY injection, Inject 60 mg into the skin every 6 (six) months., Disp: , Rfl:  ?  DULoxetine (CYMBALTA) 30 MG capsule, TAKE 1 CAPSULE TWICE A DAY (Patient taking differently: 60 mg daily.), Disp: 180 capsule, Rfl: 3 ?  fentaNYL (DURAGESIC) 25 MCG/HR, Place 1 patch onto the skin every 3 (three) days., Disp: 10 patch, Rfl: 0 ?  fluticasone (FLONASE) 50 MCG/ACT nasal spray, Place 1 spray into both nostrils daily., Disp: , Rfl:  ?  gabapentin (NEURONTIN) 100 MG capsule, Take  300 mg by mouth 3 (three) times daily., Disp: , Rfl:  ?  HYDROcodone-acetaminophen (NORCO) 5-325 MG tablet, Take 1 tablet by mouth every 6 (six) hours as needed for moderate pain., Disp: 120 tablet, Rfl: 0 ?  levothyroxine (SYNTHROID) 75 MCG tablet, Take 75 mcg by mouth daily before breakfast., Disp: , Rfl:  ?  naloxone (NARCAN) 0.4 MG/ML injection, Use prn for opioid overdose, Disp: 1 mL, Rfl: 3 ?  NONFORMULARY OR COMPOUNDED ITEM, Apply 1 Pump topically 4 (four) times daily as needed. Ketamine 10%, Baclofen 2%, Cyclobenzaprine 2%, Ketoprofen 10%, Gabapentin 6%, Bupivacaine 1%, Amitryptiline 5% Dispense '100mg'$ , Disp: 100 each, Rfl: 3 ?  ondansetron (ZOFRAN) 4 MG tablet, Take 4 mg by mouth 2 (two) times daily as needed for nausea/vomiting., Disp: , Rfl:  ?  pantoprazole (PROTONIX) 40 MG tablet, Take 1 tablet by mouth daily., Disp: , Rfl:  ?  predniSONE (DELTASONE) 1 MG tablet, 4tablet, Disp: , Rfl:  ?  PREMARIN vaginal cream, Place 1 applicator vaginally 3 (three) times a week., Disp: , Rfl:  ?  Pyridoxine HCl (VITAMIN B-6 PO), Take 1 tablet by mouth daily., Disp: , Rfl:  ?  cloNIDine (CATAPRES - DOSED IN MG/24 HR) 0.1 mg/24hr patch, Place 1 patch (0.1 mg total) onto the skin once a week. (Patient not taking: Reported on 10/20/2021), Disp: 4 patch, Rfl: 12 ?  olmesartan (BENICAR) 40 MG  tablet, Take 40 mg by mouth daily. (Patient not taking: Reported on 10/20/2021), Disp: , Rfl:  ? ?Past Medical History: ?Past Medical History:  ?Diagnosis Date  ? Adenomatous colon polyp 03/24/2018  ? Allergy   ? Arthritis   ? Cataract   ? Chronic headaches   ? Depression   ? Fibromyalgia   ? Gallstones   ? GERD (gastroesophageal reflux disease)   ? Hyperlipemia   ? Hypertension   ? Interstitial cystitis   ? Lactose intolerance   ? LBBB (left bundle branch block)   ? Osteoarthritis   ? Osteopenia   ? Pneumonia   ? Thyroid disease   ? Tinnitus   ? Trigeminal neuralgia   ? ? ?Tobacco Use: ?Social History  ? ?Tobacco Use  ?Smoking Status Never   ?Smokeless Tobacco Never  ? ? ?Labs: ?Review Flowsheet   ? ?    ? View : No data to display.  ?  ?  ?  ?  ?  ? ? ?Capillary Blood Glucose: ?No results found for: GLUCAP ? ? ?Pulmonary Assessment Scores: ? Pulmonary Assessment Scores   ? ? [place] Name 10/20/21 1137  ?  ?  ?  ? ADL UCSD  ? ADL Phase Entry    ? SOB Score total 23    ?  ? CAT Score  ? CAT Score 14    ?  ? mMRC Score  ? mMRC Score 1    ? ?  ?  ? ?  ? ?UCSD: ?Self-administered rating of dyspnea associated with activities of daily living (ADLs) ?6-point scale (0 = "not at all" to 5 = "maximal or unable to do because of breathlessness")  ?Scoring Scores range from 0 to 120.  Minimally important difference is 5 units ? ?CAT: ?CAT can identify the health impairment of COPD patients and is better correlated with disease progression.  ?CAT has a scoring range of zero to 40. The CAT score is classified into four groups of low (less than 10), medium (10 - 20), high (21-30) and very high (31-40) based on the impact level of disease on health status. A CAT score over 10 suggests significant symptoms.  A worsening CAT score could be explained by an exacerbation, poor medication adherence, poor inhaler technique, or progression of COPD or comorbid conditions.  ?CAT MCID is 2 points ? ?mMRC: ?mMRC (Modified Medical Research Council) Dyspnea Scale is used to assess the degree of baseline functional disability in patients of respiratory disease due to dyspnea. ?No minimal important difference is established. A decrease in score of 1 point or greater is considered a positive change.  ? ?Pulmonary Function Assessment: ? Pulmonary Function Assessment - 10/20/21 1236   ? ?  ? Breath  ? Bilateral Breath Sounds Clear   ? Shortness of Breath Yes;Limiting activity   ? ?  ?  ? ?  ? ? ?Exercise Target Goals: ?Exercise Program Goal: ?Individual exercise prescription set using results from initial 6 min walk test and THRR while considering  patient?s activity barriers and safety.   ? ?Exercise Prescription Goal: ?Initial exercise prescription builds to 30-45 minutes a day of aerobic activity, 2-3 days per week.  Home exercise guidelines will be given to patient during program as part of exercise prescription that the participant will acknowledge. ? ?Activity Barriers & Risk Stratification: ? Activity Barriers & Cardiac Risk Stratification - 10/20/21 1124   ? ?  ? Activity Barriers & Cardiac Risk Stratification  ? Activity Barriers Fibromyalgia;Deconditioning;Muscular  Weakness;Shortness of Breath;Balance Concerns;Arthritis   ? ?  ?  ? ?  ? ? ?6 Minute Walk: ? 6 Minute Walk   ? ? [place] Name 10/20/21 1240  ?  ?  ?  ? 6 Minute Walk  ? Phase Initial    ? Distance 1200 feet    ? Walk Time 6 minutes    ? # of Rest Breaks 0    ? MPH 2.27    ? METS 2.25    ? RPE 7    ? Perceived Dyspnea  1    ? VO2 Peak 7.86    ? Symptoms No    ? Resting HR 71 bpm    ? Resting BP 134/62    ? Resting Oxygen Saturation  98 %    ? Exercise Oxygen Saturation  during 6 min walk 94 %    ? Max Ex. HR 98 bpm    ? Max Ex. BP 162/70    ? 2 Minute Post BP 150/70    ?  ? Interval HR  ? 1 Minute HR 84    ? 2 Minute HR 90    ? 3 Minute HR 94    ? 4 Minute HR 98    ? 5 Minute HR 97    ? 6 Minute HR 96    ? 2 Minute Post HR 71    ? Interval Heart Rate? Yes    ?  ? Interval Oxygen  ? Interval Oxygen? Yes    ? Baseline Oxygen Saturation % 98 %    ? 1 Minute Oxygen Saturation % 94 %    ? 1 Minute Liters of Oxygen 0 L    ? 2 Minute Oxygen Saturation % 98 %    ? 2 Minute Liters of Oxygen 0 L    ? 3 Minute Oxygen Saturation % 96 %    ? 3 Minute Liters of Oxygen 0 L    ? 4 Minute Oxygen Saturation % 98 %    ? 4 Minute Liters of Oxygen 0 L    ? 5 Minute Oxygen Saturation % 99 %    ? 5 Minute Liters of Oxygen 0 L    ? 6 Minute Oxygen Saturation % 98 %    ? 6 Minute Liters of Oxygen 0 L    ? 2 Minute Post Oxygen Saturation % 99 %    ? 2 Minute Post Liters of Oxygen 0 L    ? ?  ?  ? ?  ? ? ?Oxygen Initial Assessment: ? Oxygen Initial Assessment -  10/20/21 1123   ? ?  ? Home Oxygen  ? Home Oxygen Device None   ? Sleep Oxygen Prescription None   ? Home Exercise Oxygen Prescription None   ? Home Resting Oxygen Prescription None   ?  ? Initial 6 mi

## 2021-10-20 NOTE — Progress Notes (Signed)
? ?Subjective:  ? ? Patient ID: [name], female    DOB: May 27, 1941, 81 y.o.   MRN: [id number] ? ?HPI: [name] is a 81 y.o. female who returns for follow up appointment for chronic pain and medication refill. She states she has left facial pain.She rates her pain 9. Her current exercise regime is walking. ? ?Ms. Corbit Morphine equivalent is 88.80 MME.   Last Oral Swab was Performed on 09/23/2021, it was consistent.  ?  ? ?Pain Inventory ?Average Pain 8 ?Pain Right Now 9 ?My pain is burning, stabbing, and aching ? ?In the last 24 hours, has pain interfered with the following? ?General activity 8 ?Relation with others 4 ?Enjoyment of life 10 ?What TIME of day is your pain at its worst? morning  ?Sleep (in general) Good ? ?Pain is worse with: bending ?Pain improves with: medication ?Relief from Meds: 5 ? ?Family History  ?Problem Relation Age of Onset  ? Heart disease Mother   ? Heart disease Father   ? Stroke Father   ? Stroke Sister   ? Asthma Daughter   ? Colon cancer Neg Hx   ? ?Social History  ? ?Socioeconomic History  ? Marital status: Married  ?  Spouse name: Not on file  ? Number of children: 2  ? Years of education: college  ? Highest education level: Not on file  ?Occupational History  ? Occupation: housewife  ?  Employer: RETIRED  ?Tobacco Use  ? Smoking status: Never  ? Smokeless tobacco: Never  ?Vaping Use  ? Vaping Use: Never used  ?Substance and Sexual Activity  ? Alcohol use: No  ?  Alcohol/week: 0.0 standard drinks  ? Drug use: No  ? Sexual activity: Not on file  ?Other Topics Concern  ? Not on file  ?Social History Narrative  ? 1 cup of Jasmine tea daily   ? ?Social Determinants of Health  ? ?Financial Resource Strain: Not on file  ?Food Insecurity: Not on file  ?Transportation Needs: Not on file  ?Physical Activity: Not on file  ?Stress: Not on file  ?Social Connections: Not on file  ? ?Past Surgical History:  ?Procedure Laterality Date  ? CHOLECYSTECTOMY  03/2010  ? COLONOSCOPY   2009  ? Dr. Raford Pitcher   ? CYSTOSCOPY    ? Multiple Cystoscopies with stents or biopsy   ? ESOPHAGOGASTRODUODENOSCOPY  10/2013  ? EYE SURGERY    ? bilateral cataract removal  ? Gamma knife radiation  01/2018  ? for trigeminal neuralgia  ? REFRACTIVE SURGERY Bilateral 2022  ? RHIZOTOMY Left 04/28/2021  ? Procedure: Left Percutaneous trigeminal balloon rhizotomy;  Surgeon: Judith Part, MD;  Location: [place] [facility];  Service: Neurosurgery;  Laterality: Left;  ? TONSILLECTOMY    ? ?Past Surgical History:  ?Procedure Laterality Date  ? CHOLECYSTECTOMY  03/2010  ? COLONOSCOPY  2009  ? Dr. Raford Pitcher   ? CYSTOSCOPY    ? Multiple Cystoscopies with stents or biopsy   ? ESOPHAGOGASTRODUODENOSCOPY  10/2013  ? EYE SURGERY    ? bilateral cataract removal  ? Gamma knife radiation  01/2018  ? for trigeminal neuralgia  ? REFRACTIVE SURGERY Bilateral 2022  ? RHIZOTOMY Left 04/28/2021  ? Procedure: Left Percutaneous trigeminal balloon rhizotomy;  Surgeon: Judith Part, MD;  Location: [place];  Service: Neurosurgery;  Laterality: Left;  ? TONSILLECTOMY    ? ?Past Medical History:  ?Diagnosis Date  ? Adenomatous colon polyp 03/24/2018  ? Allergy   ? Arthritis   ?  Cataract   ? Chronic headaches   ? Depression   ? Fibromyalgia   ? Gallstones   ? GERD (gastroesophageal reflux disease)   ? Hyperlipemia   ? Hypertension   ? Interstitial cystitis   ? Lactose intolerance   ? LBBB (left bundle branch block)   ? Osteoarthritis   ? Osteopenia   ? Pneumonia   ? Thyroid disease   ? Tinnitus   ? Trigeminal neuralgia   ? ?BP (!) 144/83   Pulse 70   Ht '5\' 3"'$  (1.6 m)   Wt 151 lb 9.6 oz (68.8 kg)   SpO2 97%   BMI 26.85 kg/m?  ? ?Opioid Risk Score:   ?Fall Risk Score:  `1 ? ?Depression screen PHQ 2/9 ? ? ?  10/20/2021  ?  1:08 PM 10/20/2021  ? 11:16 AM 09/23/2021  ?  1:56 PM 07/31/2021  ? 11:54 AM 07/08/2021  ?  3:05 PM 06/02/2021  ? 10:41 AM 05/06/2021  ? 11:06 AM  ?Depression screen PHQ 2/9  ?Decreased Interest 1 0 0 0 0 1 0  ?Down, Depressed,  Hopeless '1 3 1 '$ 0 0 1   ?PHQ - 2 Score '2 3 1 '$ 0 0 2 0  ?Altered sleeping  1     0  ?Tired, decreased energy  2       ?Change in appetite  0       ?Feeling bad or failure about yourself   0       ?Trouble concentrating  0       ?Moving slowly or fidgety/restless  0       ?Suicidal thoughts  0       ?PHQ-9 Score  6     0  ?Difficult doing work/chores  Somewhat difficult       ?  ? ?Review of Systems  ?Constitutional: Negative.   ?HENT: Negative.    ?Eyes: Negative.   ?Respiratory: Negative.    ?Cardiovascular: Negative.   ?Gastrointestinal: Negative.   ?Endocrine: Negative.   ?Genitourinary: Negative.   ?Musculoskeletal: Negative.   ?Skin: Negative.   ?Allergic/Immunologic: Negative.   ?Neurological:  Positive for headaches.  ?Hematological: Negative.   ?Psychiatric/Behavioral: Negative.    ? ?   ?Objective:  ? Physical Exam ?Vitals and nursing note reviewed.  ?Constitutional:   ?   Appearance: Normal appearance.  ?Cardiovascular:  ?   Rate and Rhythm: Normal rate and regular rhythm.  ?   Pulses: Normal pulses.  ?   Heart sounds: Normal heart sounds.  ?Pulmonary:  ?   Effort: Pulmonary effort is normal.  ?   Breath sounds: Normal breath sounds.  ?Musculoskeletal:  ?   Cervical back: Normal range of motion and neck supple.  ?   Comments: Normal Muscle Bulk and Muscle Testing Reveals:  ?Upper Extremities: Full ROM and Muscle Strength 5/5 ? Lower Extremities: Full ROM and Muscle Strength 5/5 ?Arises from Table slowly ?Narrow Based  Gait  ?   ?Skin: ?   General: Skin is warm and dry.  ?Neurological:  ?   Mental Status: She is alert and oriented to person, place, and time.  ?Psychiatric:     ?   Mood and Affect: Mood normal.     ?   Behavior: Behavior normal.  ? ? ? ? ?   ?Assessment & Plan:  ?Right Lumbar Radiculitis: Continue Gabapentin. Continue HEP as Tolerated. Continue to Monitor. 10/20/2021 ?Trigeminal Neuralgia: Continue Gabapentin. Continue to Monitor. 10/20/2021 ?Fibromyalgia: Continue HEP as  Tolerated. Continue  Gabapentin. Continue to Monitor. 10/20/2021 ?Chronic Pain Syndrome: Sensory Deaffernation Syndrome: Refilled: Fentanyl 12 MCG one  patch every three days #10 and Hydrocodone '5mg'$ /325 mg one tablet 4 times a day as needed for pain #120. We will continue the opioid monitoring program, this consists of regular clinic visits, examinations, urine drug screen, pill counts as well as use of [place] Controlled Substance Reporting system. A 12 month History has been reviewed on the [place] Controlled Substance Reporting System on 05/01//2023 ?  ?F/U in 1 month   ?  ?  ?  ? ?

## 2021-10-22 NOTE — Progress Notes (Addendum)
Pulmonary Rehab Orientation Physical Assessment Note ? ?Physical assessment reveals  Pt is alert and oriented x 3.  Heart rate is normal, breath sounds clear to auscultation, no wheezes, rales, or rhonchi, diminished throughout. Reports non-productive cough. Bowel sounds present.  Pt denies abdominal discomfort, nausea, vomiting or diarrhea. Grip strength equal, strong. Distal pulses ; trace swelling to lower extremities. Cherre Huger, BSN ?Cardiac and Pulmonary Rehab Nurse Navigator  ? ?

## 2021-10-28 ENCOUNTER — Encounter (HOSPITAL_COMMUNITY)
Admission: RE | Admit: 2021-10-28 | Discharge: 2021-10-28 | Disposition: A | Discharge status: Home / Self Care | Payer: Medicare Other | Source: Ambulatory Visit | Attending: Pulmonary Disease | Admitting: Pulmonary Disease

## 2021-10-28 DIAGNOSIS — G894 Chronic pain syndrome: Secondary | ICD-10-CM | POA: Diagnosis not present

## 2021-10-28 DIAGNOSIS — M797 Fibromyalgia: Secondary | ICD-10-CM | POA: Diagnosis not present

## 2021-10-28 DIAGNOSIS — R942 Abnormal results of pulmonary function studies: Secondary | ICD-10-CM | POA: Diagnosis not present

## 2021-10-28 DIAGNOSIS — R0602 Shortness of breath: Secondary | ICD-10-CM | POA: Diagnosis not present

## 2021-10-28 DIAGNOSIS — G9689 Other specified disorders of central nervous system: Secondary | ICD-10-CM | POA: Diagnosis not present

## 2021-10-28 DIAGNOSIS — G500 Trigeminal neuralgia: Secondary | ICD-10-CM | POA: Diagnosis not present

## 2021-10-28 NOTE — Progress Notes (Signed)
Daily Session Note ? ?Patient Details  ?Name: [name] ?MRN: [id number] ?Date of Birth: [birth year] ?Referring Provider:   ?Flowsheet Row Pulmonary Rehab Walk Test from 10/20/2021 in [place]  ?Referring Provider Loanne Drilling  ? ?  ? ? ?Encounter Date: 10/28/2021 ? ?Check In: ? Session Check In - 10/28/21 1501   ? ?  ? Check-In  ? Supervising physician immediately available to respond to emergencies [facility] immediately available   ? Physician(s) Karleen Hampshire   ? Location [facility]   ? Staff Present Maurice Small, RN, Quentin Ore, MS, ACSM-CEP, Exercise Physiologist;Joan Leonia Reeves, RN, [employer], MS, ACSM-CEP, CCRP, Exercise Physiologist   ? Virtual Visit No   ? Medication changes reported     No   ? Fall or balance concerns reported    No   ? Tobacco Cessation No Change   ? Warm-up and Cool-down Performed as group-led instruction   ? Resistance Training Performed Yes   ? VAD Patient? No   ? PAD/SET Patient? No   ?  ? Pain Assessment  ? Currently in Pain? No/denies   ? Multiple Pain Sites No   ? ?  ?  ? ?  ? ? ?Capillary Blood Glucose: ?No results found for this or any previous visit (from the past 24 hour(s)). ? ? ? ?Social History  ? ?Tobacco Use  ?Smoking Status Never  ?Smokeless Tobacco Never  ? ? ?Goals Met:  ?Proper associated with RPD/PD & O2 Sat ?Exercise tolerated well ?No report of concerns or symptoms today ?Strength training completed today ? ?Goals Unmet:  ?Not Applicable ? ?Comments: Service time is from 1317 to 1434.  ? ? ?Dr. Rodman Pickle is Medical Director for Pulmonary Rehab at [facility].  ?

## 2021-10-30 ENCOUNTER — Encounter (HOSPITAL_COMMUNITY)
Admission: RE | Admit: 2021-10-30 | Discharge: 2021-10-30 | Disposition: A | Discharge status: Home / Self Care | Payer: Medicare Other | Source: Ambulatory Visit | Attending: Pulmonary Disease | Admitting: Pulmonary Disease

## 2021-10-30 DIAGNOSIS — R0602 Shortness of breath: Secondary | ICD-10-CM | POA: Diagnosis not present

## 2021-10-30 DIAGNOSIS — R942 Abnormal results of pulmonary function studies: Secondary | ICD-10-CM | POA: Diagnosis not present

## 2021-10-30 DIAGNOSIS — G894 Chronic pain syndrome: Secondary | ICD-10-CM | POA: Diagnosis not present

## 2021-10-30 DIAGNOSIS — G500 Trigeminal neuralgia: Secondary | ICD-10-CM | POA: Diagnosis not present

## 2021-10-30 DIAGNOSIS — M797 Fibromyalgia: Secondary | ICD-10-CM | POA: Diagnosis not present

## 2021-10-30 DIAGNOSIS — G9689 Other specified disorders of central nervous system: Secondary | ICD-10-CM | POA: Diagnosis not present

## 2021-10-30 NOTE — Progress Notes (Signed)
[name] 81 y.o. female ?Nutrition Note ?She is motivated to make lifestyle changes to aid with pulmonary rehab. Patient has medical history of trigeminal neuralgia, decreased diffusion capacity. She lives at home with her husband; he is doing a lot of the grocery shopping and cooking as she is dealing with significant pain. She reports overall decreased activity over the last 3-4 years related to the pandemic and trigeminal neuralgia; she previously was very active with tennis. Per pulmonary notes 08/26/21, "low suspicion for pulmonary vascular disease". She does report some weight gain related to increased sedentary lifestyle. She is hopeful about upcoming appointment with the [facility]. She does report some possible food sensitivities that were identified through past blood testing such as tomatoes, peppers, gluten, etc. She continues to eat these foods with no issues. Her husband is very supportive.  ? ?Labs: no new labs ? ?24 Hour Recall: ?Breakfast: granola with berries ?Lunch: Smoothie (berries, chocolate [employer] Protein powder) ?Snack: hot tea, two cookies (~200kcals) ?Dinner: meat, two vegetables OR meat, vegetable, fruit ? ?Nutrition Diagnosis ?Overweight related to excessive energy intake as evidenced by a BMI of 26.7 ? ?Nutrition Intervention ?Pt?s individual nutrition plan reviewed with pt. ?Benefits of adopting Heart Healthy diet discussed.  ?Continue client-centered nutrition education by RD, as part of interdisciplinary care. ? ?Monitor/Evaluation: ?Patient reports motivation for adherence to heart healthy diet. We reviewed her current nutrition habits and dietary recommendations including fiber, eating frequency, and protein intake. She attended nutrition education class. Encouraged patient to consider trigeminal neuralgia support group and/or behavioral health counseling for additional support. Patient amicable to RD suggestions and verbalizes understanding. Will follow-up as needed.   ? ?10 minutes spent in review of topics related to a heart healthy diet including sodium intake, blood sugar control, weight management, and fiber intake. ? ?Goal(s) ?Pt to identify and limit food sources of saturated fat, trans fat, refined carbohydrates and sodium ?Pt to describe the benefit of including lean protein/plant proteins, fruits, vegetables, whole grains, nuts/seeds, and low-fat dairy products in a heart healthy meal plan. ?Pt to practice mindful and intuitive eating exercises ? ?Plan:  ?Pt to attend nutrition classes  ?Will provide client-centered nutrition education as part of interdisciplinary care ?Monitor and evaluate progress toward nutrition goal with team. ? ? ?Aldona Bar [place], MS, RDN, LDN ? ?

## 2021-10-30 NOTE — Progress Notes (Signed)
Daily Session Note ? ?Patient Details  ?Name: [name] ?MRN: [id number] ?Date of Birth: 1941/01/21 ?Referring Provider:   ?Flowsheet Row Pulmonary Rehab Walk Test from 10/20/2021 in [place]  ?Referring Provider Loanne Drilling  ? ?  ? ? ?Encounter Date: 10/30/2021 ? ?Check In: ? Session Check In - 10/30/21 1431   ? ?  ? Check-In  ? Supervising physician immediately available to respond to emergencies [facility] immediately available   ? Physician(s) Dr. Sloan Leiter   ? Location [facility]   ? Staff Present Maurice Small, RN, Quentin Ore, MS, ACSM-CEP, Exercise Physiologist;Annedrea Rosezella [place], RN, MHA;Jetta Walker BS, ACSM EP-C, Exercise Physiologist   ? Virtual Visit No   ? Medication changes reported     No   ? Fall or balance concerns reported    No   ? Tobacco Cessation No Change   ? Warm-up and Cool-down Performed on first and last piece of equipment   ? Resistance Training Performed Yes   ? VAD Patient? No   ?  ? Pain Assessment  ? Currently in Pain? No/denies   ? ?  ?  ? ?  ? ? ?Capillary Blood Glucose: ?No results found for this or any previous visit (from the past 24 hour(s)). ? ? ? ?Social History  ? ?Tobacco Use  ?Smoking Status Never  ?Smokeless Tobacco Never  ? ? ?Goals Met:  ?Proper associated with RPD/PD & O2 Sat ?Independence with exercise equipment ?Exercise tolerated well ?No report of concerns or symptoms today ?Strength training completed today ? ?Goals Unmet:  ?Not Applicable ? ?Comments: Service time is from 1323 to 1430.  ? ? ?Dr. Rodman Pickle is Medical Director for Pulmonary Rehab at [facility].  ?

## 2021-11-04 ENCOUNTER — Encounter (HOSPITAL_COMMUNITY)
Admission: RE | Admit: 2021-11-04 | Discharge: 2021-11-04 | Disposition: A | Discharge status: Home / Self Care | Payer: Medicare Other | Source: Ambulatory Visit | Attending: Pulmonary Disease | Admitting: Pulmonary Disease

## 2021-11-04 VITALS — Wt 150.4 lb

## 2021-11-04 DIAGNOSIS — G894 Chronic pain syndrome: Secondary | ICD-10-CM | POA: Diagnosis not present

## 2021-11-04 DIAGNOSIS — G9689 Other specified disorders of central nervous system: Secondary | ICD-10-CM | POA: Diagnosis not present

## 2021-11-04 DIAGNOSIS — G500 Trigeminal neuralgia: Secondary | ICD-10-CM | POA: Diagnosis not present

## 2021-11-04 DIAGNOSIS — R942 Abnormal results of pulmonary function studies: Secondary | ICD-10-CM | POA: Diagnosis not present

## 2021-11-04 DIAGNOSIS — R0602 Shortness of breath: Secondary | ICD-10-CM | POA: Diagnosis not present

## 2021-11-04 DIAGNOSIS — M797 Fibromyalgia: Secondary | ICD-10-CM | POA: Diagnosis not present

## 2021-11-04 NOTE — Progress Notes (Signed)
Daily Session Note ? ?Patient Details  ?Name: [name] ?MRN: [id number] ?Date of Birth: [birth year] ?Referring Provider:   ?Flowsheet Row Pulmonary Rehab Walk Test from 10/20/2021 in [place]  ?Referring Provider Loanne Drilling  ? ?  ? ? ?Encounter Date: 11/04/2021 ? ?Check In: ? Session Check In - 11/04/21 1504   ? ?  ? Check-In  ? Supervising physician immediately available to respond to emergencies [facility] immediately available   ? Physician(s) Dr. Sloan Leiter   ? [facility]   ? Staff Present Trish Fountain, RN, BSN;Lisa Ysidro Evert, Cathleen Fears, MS, ACSM-CEP, Exercise Physiologist   ? Virtual Visit No   ? Medication changes reported     No   ? Fall or balance concerns reported    No   ? Tobacco Cessation No Change   ? Warm-up and Cool-down Performed on first and last piece of equipment   ? Resistance Training Performed Yes   ? VAD Patient? No   ? PAD/SET Patient? No   ?  ? Pain Assessment  ? Currently in Pain? No/denies   ? Multiple Pain Sites No   ? ?  ?  ? ?  ? ? ?Capillary Blood Glucose: ?No results found for this or any previous visit (from the past 24 hour(s)). ? ? ? ?Social History  ? ?Tobacco Use  ?Smoking Status Never  ?Smokeless Tobacco Never  ? ? ?Goals Met:  ?Proper associated with RPD/PD & O2 Sat ?Exercise tolerated well ?Personal goals reviewed ?No report of concerns or symptoms today ?Strength training completed today ? ?Goals Unmet:  ?Not Applicable ? ?Comments: Service time is from 1332 to 1442  ? ? ?Dr. Rodman Pickle is Medical Director for Pulmonary Rehab at [facility].  ?

## 2021-11-06 ENCOUNTER — Encounter (HOSPITAL_COMMUNITY)
Admission: RE | Admit: 2021-11-06 | Discharge: 2021-11-06 | Disposition: A | Discharge status: Home / Self Care | Payer: Medicare Other | Source: Ambulatory Visit | Attending: Pulmonary Disease | Admitting: Pulmonary Disease

## 2021-11-06 DIAGNOSIS — G9689 Other specified disorders of central nervous system: Secondary | ICD-10-CM | POA: Diagnosis not present

## 2021-11-06 DIAGNOSIS — M797 Fibromyalgia: Secondary | ICD-10-CM | POA: Diagnosis not present

## 2021-11-06 DIAGNOSIS — R942 Abnormal results of pulmonary function studies: Secondary | ICD-10-CM | POA: Diagnosis not present

## 2021-11-06 DIAGNOSIS — G500 Trigeminal neuralgia: Secondary | ICD-10-CM | POA: Diagnosis not present

## 2021-11-06 DIAGNOSIS — G894 Chronic pain syndrome: Secondary | ICD-10-CM | POA: Diagnosis not present

## 2021-11-06 DIAGNOSIS — R0602 Shortness of breath: Secondary | ICD-10-CM

## 2021-11-06 NOTE — Progress Notes (Signed)
Daily Session Note  Patient Details  Name: MERSADEZ LINDEN MRN: [id number] Date of Birth: [birth year] Referring Provider:   April Manson Pulmonary Rehab Walk Test from 10/20/2021 in [place]  Referring Provider Loanne Drilling       Encounter Date: 11/06/2021  Check In:  Session Check In - 11/06/21 1433       Check-In   Supervising physician immediately available to respond to emergencies [facility] immediately available    Physician(s) Dr. Tawanna Solo    Location [facility]    Staff Present Rosebud Poles, RN, BSN;Lataria Courser Ysidro Evert, Cathleen Fears, MS, ACSM-CEP, Exercise Physiologist    Virtual Visit No    Medication changes reported     No    Fall or balance concerns reported    No    Tobacco Cessation No Change    Warm-up and Cool-down Performed as group-led instruction    Resistance Training Performed Yes    VAD Patient? No    PAD/SET Patient? No      VAD patient   Has back up controller? No    Has spare charged batteries? No    Has battery cables? No    Has compatible battery clips? No      Pain Assessment   Currently in Pain? No/denies    Multiple Pain Sites No             Capillary Blood Glucose: No results found for this or any previous visit (from the past 24 hour(s)).    Social History   Tobacco Use  Smoking Status Never  Smokeless Tobacco Never    Goals Met:  Exercise tolerated well No report of concerns or symptoms today Strength training completed today  Goals Unmet:  Not Applicable  Comments: Service time is from 1316 to [place]    Dr. Rodman Pickle is Medical Director for Pulmonary Rehab at [facility].

## 2021-11-11 ENCOUNTER — Other Ambulatory Visit: Payer: Self-pay | Admitting: Physical Medicine and Rehabilitation

## 2021-11-11 ENCOUNTER — Telehealth: Payer: Self-pay

## 2021-11-11 ENCOUNTER — Encounter (HOSPITAL_COMMUNITY)
Admission: RE | Admit: 2021-11-11 | Discharge: 2021-11-11 | Disposition: A | Discharge status: Home / Self Care | Payer: Medicare Other | Source: Ambulatory Visit | Attending: Pulmonary Disease | Admitting: Pulmonary Disease

## 2021-11-11 DIAGNOSIS — M797 Fibromyalgia: Secondary | ICD-10-CM | POA: Diagnosis not present

## 2021-11-11 DIAGNOSIS — G894 Chronic pain syndrome: Secondary | ICD-10-CM | POA: Diagnosis not present

## 2021-11-11 DIAGNOSIS — R0602 Shortness of breath: Secondary | ICD-10-CM | POA: Diagnosis not present

## 2021-11-11 DIAGNOSIS — G9689 Other specified disorders of central nervous system: Secondary | ICD-10-CM | POA: Diagnosis not present

## 2021-11-11 DIAGNOSIS — R942 Abnormal results of pulmonary function studies: Secondary | ICD-10-CM | POA: Diagnosis not present

## 2021-11-11 DIAGNOSIS — G500 Trigeminal neuralgia: Secondary | ICD-10-CM | POA: Diagnosis not present

## 2021-11-11 NOTE — Progress Notes (Signed)
Daily Session Note  Patient Details  Name: [name] MRN: [id number] Date of Birth: 1940/09/20 Referring Provider:   April Manson Pulmonary Rehab Walk Test from 10/20/2021 in [place]  Referring Provider Loanne Drilling       Encounter Date: 11/11/2021  Check In:  Session Check In - 11/11/21 1421       Check-In   Supervising physician immediately available to respond to emergencies [facility] immediately available    Physician(s) Dr. Tawanna Solo    [facility]    Staff Present Rosebud Poles, RN, BSN;Lisa Ysidro Evert, Cathleen Fears, MS, ACSM-CEP, Exercise Physiologist;Olinty Celesta Aver, MS, ACSM CEP, Exercise Physiologist    Virtual Visit No    Medication changes reported     No    Fall or balance concerns reported    No    Tobacco Cessation No Change    Warm-up and Cool-down Performed as group-led instruction    Resistance Training Performed Yes    VAD Patient? No    PAD/SET Patient? No      Pain Assessment   Currently in Pain? Yes    Pain Score 6     Pain Location Knee    Pain Orientation Left    Pain Type Chronic pain    Multiple Pain Sites No             Capillary Blood Glucose: No results found for this or any previous visit (from the past 24 hour(s)).    Social History   Tobacco Use  Smoking Status Never  Smokeless Tobacco Never    Goals Met:  Proper associated with RPD/PD & O2 Sat Exercise tolerated well No report of concerns or symptoms today Strength training completed today  Goals Unmet:  Not Applicable  Comments: Service time is from 1320 to [place]    Dr. Rodman Pickle is Medical Director for Pulmonary Rehab at [facility].

## 2021-11-11 NOTE — Telephone Encounter (Signed)
VOB submitted for SynviscOne, bilateral knee BV pending 

## 2021-11-12 NOTE — Progress Notes (Signed)
Pulmonary Individual Treatment Plan  Patient Details  [name] MRN: [id number] Date of Birth: [birth year] Referring Provider:   April Fuller Pulmonary Rehab Walk Test from 10/20/2021 in [place]  Referring Provider [name]       Initial Encounter Date:  Flowsheet Row Pulmonary Rehab Walk Test from 10/20/2021 in [place]  Date 10/20/21       Visit Diagnosis: Diffusion capacity of lung (dl), decreased  Shortness of breath  Patient's Home Medications on Admission:   Current Outpatient Medications:    acetaminophen (TYLENOL) 500 MG tablet, Take 500 mg by mouth 3 (three) times daily., Disp: , Rfl:    Acetylcysteine (NAC) 600 MG CAPS, Take 1 capsule (600 mg total) by mouth 2 (two) times daily., Disp: 60 capsule, Rfl: 0   amLODipine (NORVASC) 5 MG tablet, Take 5 mg by mouth daily., Disp: , Rfl:    benzocaine (ORAJEL) 10 % mucosal gel, Use as directed 1 application in the mouth or throat as needed for mouth pain., Disp: 5.3 g, Rfl: 0   calcium carbonate (OS-CAL) 600 MG TABS tablet, Take 600 mg by mouth daily with breakfast., Disp: , Rfl:    cetirizine (ZYRTEC) 10 MG tablet, Take 10 mg by mouth daily., Disp: , Rfl:    Cholecalciferol (VITAMIN D-3) 25 MCG (1000 UT) CAPS, Take 2,000 Units by mouth daily., Disp: , Rfl:    cloNIDine (CATAPRES - DOSED IN MG/24 HR) 0.1 mg/24hr patch, Place 1 patch (0.1 mg total) onto the skin once a week. (Patient not taking: Reported on 10/20/2021), Disp: 4 patch, Rfl: 12   Cyanocobalamin (VITAMIN B-12) 2500 MCG SUBL, Place 2,500 mcg under the tongue daily., Disp: , Rfl:    denosumab (PROLIA) 60 MG/ML SOSY injection, Inject 60 mg into the skin every 6 (six) months., Disp: , Rfl:    DULoxetine (CYMBALTA) 30 MG capsule, TAKE 1 CAPSULE TWICE A DAY (Patient taking differently: 60 mg daily.), Disp: 180 capsule, Rfl: 3   fentaNYL (DURAGESIC) 25 MCG/HR, Place 1 patch onto the skin every 3  (three) days., Disp: 10 patch, Rfl: 0   fluticasone (FLONASE) 50 MCG/ACT nasal spray, Place 1 spray into both nostrils daily., Disp: , Rfl:    gabapentin (NEURONTIN) 100 MG capsule, Take 300 mg by mouth 3 (three) times daily., Disp: , Rfl:    GENERIC EXTERNAL MEDICATION, 1 capsule daily. Pure Encapsulations Digestive Enzymes Ultra, Disp: , Rfl:    HYDROcodone-acetaminophen (NORCO) 5-325 MG tablet, Take 1 tablet by mouth every 6 (six) hours as needed for moderate pain., Disp: 120 tablet, Rfl: 0   levothyroxine (SYNTHROID) 75 MCG tablet, Take 75 mcg by mouth daily before breakfast., Disp: , Rfl:    Multiple Vitamins-Minerals (PRESERVISION AREDS 2 PO), Take 2 capsules by mouth daily., Disp: , Rfl:    naloxone (NARCAN) 0.4 MG/ML injection, Use prn for opioid overdose, Disp: 1 mL, Rfl: 3   NONFORMULARY OR COMPOUNDED ITEM, Apply 1 Pump topically 4 (four) times daily as needed. Ketamine 10%, Baclofen 2%, Cyclobenzaprine 2%, Ketoprofen 10%, Gabapentin 6%, Bupivacaine 1%, Amitryptiline 5% Dispense '100mg'$ , Disp: 100 each, Rfl: 3   olmesartan (BENICAR) 40 MG tablet, Take 40 mg by mouth daily. 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              Capillary Blood Glucose: No results found for: GLUCAP   Pulmonary Assessment Scores:  Pulmonary Assessment Scores     [name] 10/20/21 1137         ADL UCSD   ADL Phase Entry     SOB Score total 23       CAT Score   CAT Score 14       mMRC Score   mMRC Score 1             UCSD: Self-administered rating of dyspnea associated with activities of daily living (ADLs) 6-point scale (0 = "not at all" to 5 = "maximal or unable to do because of breathlessness")  Scoring Scores range from 0 to 120.  Minimally important difference is 5 units  CAT: CAT can identify the health impairment of COPD patients and is better correlated with disease progression.  CAT has a scoring range of zero to 40. The CAT score is classified into four groups of low (less than 10), medium (10 - 20), high (21-30) and very high (31-40) based on the impact level of disease on health status. A CAT score over 10 suggests significant symptoms.  A worsening CAT score could be explained by an exacerbation, poor medication adherence, poor inhaler technique, or progression of COPD or comorbid conditions.  CAT MCID is 2 points  mMRC: mMRC (Modified Medical Research Council) Dyspnea Scale is used to assess the degree of baseline functional disability in patients of respiratory disease due to dyspnea. No minimal important difference is established. A decrease in score of 1 point or greater is considered a positive change.   Pulmonary Function Assessment:  Pulmonary Function Assessment - 10/20/21 1236       Breath   Bilateral Breath Sounds Clear    Shortness of Breath Yes;Limiting activity              Exercise Target Goals: Exercise Program Goal: Individual exercise prescription set using results from initial 6 min walk test and THRR while considering  patient's activity barriers and safety.   Exercise Prescription Goal: Initial exercise prescription builds to 30-45 minutes a day of aerobic activity, 2-3 days per week.  Home exercise guidelines will be given to patient during program as part of exercise prescription that the participant  will acknowledge.  Activity Barriers & Risk Stratification:  Activity Barriers & Cardiac Risk Stratification - 10/20/21 1124       Activity Barriers & Cardiac Risk Stratification   Activity Barriers Fibromyalgia;Deconditioning;Muscular Weakness;Shortness of Breath;Balance Concerns;Arthritis             6 Minute Walk:  6 Minute Walk     [name] 10/20/21 1240         6 Minute Walk   Phase Initial     Distance 1200 feet     Walk Time 6 minutes     # of Rest Breaks 0     MPH 2.27     METS 2.25     RPE 7     Perceived Dyspnea  1     VO2 Peak 7.86     Symptoms No     Resting HR 71 bpm     Resting BP 134/62     Resting Oxygen Saturation  98 %     Exercise Oxygen Saturation  during 6 min walk 94 %     Max Ex. HR 98 bpm     Max Ex. BP 162/70     2 Minute Post BP 150/70       Interval HR   1 Minute HR 84     2 Minute HR 90     3 Minute HR 94     4 Minute HR 98     5 Minute HR 97     6 Minute HR 96     2 Minute Post HR 71     Interval Heart Rate? Yes       Interval Oxygen   Interval Oxygen? Yes     Baseline Oxygen Saturation % 98 %     1 Minute Oxygen Saturation % 94 %     1 Minute Liters of Oxygen 0 L     2 Minute Oxygen Saturation % 98 %     2 Minute Liters of Oxygen 0 L     3 Minute Oxygen Saturation % 96 %     3 Minute Liters of Oxygen 0 L     4 Minute Oxygen Saturation % 98 %     4 Minute Liters of Oxygen 0 L     5 Minute Oxygen Saturation % 99 %     5 Minute Liters of Oxygen 0 L     6 Minute Oxygen  Saturation % 98 %     6 Minute Liters of Oxygen 0 L     2 Minute Post Oxygen Saturation % 99 %     2 Minute Post Liters of Oxygen 0 L              Oxygen Initial Assessment:  Oxygen Initial Assessment - 11/04/21 0856       Initial 6 min Walk   Oxygen Used None             Oxygen Re-Evaluation:  Oxygen Re-Evaluation     [name] 11/04/21 0856             Program Oxygen Prescription   Program Oxygen Prescription None         Home Oxygen   Home Oxygen Device None       Sleep Oxygen Prescription None       Home Exercise Oxygen Prescription None       Home Resting Oxygen Prescription None  Goals/Expected Outcomes   Short Term Goals To learn and exhibit compliance with exercise, home and travel O2 prescription;To learn and understand importance of monitoring SPO2 with pulse oximeter and demonstrate accurate use of the pulse oximeter.;To learn and understand importance of maintaining oxygen saturations>88%;To learn and demonstrate proper pursed lip breathing techniques or other breathing techniques. ;To learn and demonstrate proper use of respiratory medications       Long  Term Goals Exhibits compliance with exercise, home  and travel O2 prescription;Verbalizes importance of monitoring SPO2 with pulse oximeter and return demonstration;Maintenance of O2 saturations>88%;Exhibits proper breathing techniques, such as pursed lip breathing or other method taught during program session;Compliance with respiratory medication;Demonstrates proper use of MDI's       Goals/Expected Outcomes Compliance and understanding of oxygen saturation monitoring and breathing techniques to decrease shortness of breath.                Oxygen Discharge (Final Oxygen Re-Evaluation):  Oxygen Re-Evaluation - 11/04/21 0856       Program Oxygen Prescription   Program Oxygen Prescription None      Home Oxygen   Home Oxygen Device None    Sleep Oxygen Prescription None    Home Exercise  Oxygen Prescription None    Home Resting Oxygen Prescription None      Goals/Expected Outcomes   Short Term Goals To learn and exhibit compliance with exercise, home and travel O2 prescription;To learn and understand importance of monitoring SPO2 with pulse oximeter and demonstrate accurate use of the pulse oximeter.;To learn and understand importance of maintaining oxygen saturations>88%;To learn and demonstrate proper pursed lip breathing techniques or other breathing techniques. ;To learn and demonstrate proper use of respiratory medications    Long  Term Goals Exhibits compliance with exercise, home  and travel O2 prescription;Verbalizes importance of monitoring SPO2 with pulse oximeter and return demonstration;Maintenance of O2 saturations>88%;Exhibits proper breathing techniques, such as pursed lip breathing or other method taught during program session;Compliance with respiratory medication;Demonstrates proper use of MDI's    Goals/Expected Outcomes Compliance and understanding of oxygen saturation monitoring and breathing techniques to decrease shortness of breath.             Initial Exercise Prescription:  Initial Exercise Prescription - 10/20/21 1200       Date of Initial Exercise RX and Referring Provider   Date 10/20/21    Referring Provider [name]    Expected Discharge Date 12/25/21      NuStep   Level 1    SPM 60    Minutes 15      Arm Ergometer   Level 1    Watts 4    Minutes 15      Prescription Details   Frequency (times per week) 2    Duration Progress to 30 minutes of continuous aerobic without signs/symptoms of physical distress      Intensity   THRR 40-80% of Max Heartrate 55-110    Ratings of Perceived Exertion 11-13    Perceived Dyspnea 0-4      Progression   Progression Continue to progress workloads to maintain intensity without signs/symptoms of physical distress.      Resistance Training   Training Prescription Yes    Weight red bands     Reps 10-15             Perform Capillary Blood Glucose checks as needed.  Exercise Prescription Changes:   Exercise Prescription Changes     [name] 11/04/21 1500  Response to Exercise   Blood Pressure (Admit) 110/70       Blood Pressure (Exercise) 150/80       Blood Pressure (Exit) 129/52       Heart Rate (Admit) 63 bpm       Heart Rate (Exercise) 96 bpm       Heart Rate (Exit) 72 bpm       Oxygen Saturation (Admit) 97 %       Oxygen Saturation (Exercise) 96 %       Oxygen Saturation (Exit) 98 %       Rating of Perceived Exertion (Exercise) 15       Perceived Dyspnea (Exercise) 1       Duration Continue with 30 min of aerobic exercise without signs/symptoms of physical distress.       Intensity THRR unchanged         Resistance Training   Training Prescription Yes       Weight red bands       Reps 10-15         NuStep   Level 2       Minutes 15       METs 2.3         Arm Ergometer   Level 1.5       Minutes 15                Exercise Comments:   Exercise Comments     [name] 10/28/21 1510           Exercise Comments Pt completed first day of exercise. [name] exercised for 15 min on the arm ergometer and Nustep. [name] exercised at level 1 on arm ergometer. She exercised at level 1 on the Nustep averaging 1.4 METs. [name] performed the warmup and cooldown standing without limitations.                Exercise Goals and Review:   Exercise Goals     [name] 10/20/21 1125 11/04/21 0852           Exercise Goals   Increase Physical Activity Yes Yes      Intervention Provide advice, education, support and counseling about physical activity/exercise needs.;Develop an individualized exercise prescription for aerobic and resistive training based on initial evaluation findings, risk stratification, comorbidities and participant's personal goals. Provide advice, education, support and counseling about physical activity/exercise needs.;Develop  an individualized exercise prescription for aerobic and resistive training based on initial evaluation findings, risk stratification, comorbidities and participant's personal goals.      Expected Outcomes Short Term: Attend rehab on a regular basis to increase amount of physical activity.;Long Term: Add in home exercise to make exercise part of routine and to increase amount of physical activity.;Long Term: Exercising regularly at least 3-5 days a week. Short Term: Attend rehab on a regular basis to increase amount of physical activity.;Long Term: Add in home exercise to make exercise part of routine and to increase amount of physical activity.;Long Term: Exercising regularly at least 3-5 days a week.      Increase Strength and Stamina Yes Yes      Intervention Provide advice, education, support and counseling about physical activity/exercise needs.;Develop an individualized exercise prescription for aerobic and resistive training based on initial evaluation findings, risk stratification, comorbidities and participant's personal goals. Provide advice, education, support and counseling about physical activity/exercise needs.;Develop an individualized exercise prescription for aerobic and resistive training based on initial evaluation findings, risk stratification, comorbidities and  participant's personal goals.      Expected Outcomes Short Term: Increase workloads from initial exercise prescription for resistance, speed, and METs.;Short Term: Perform resistance training exercises routinely during rehab and add in resistance training at home;Long Term: Improve cardiorespiratory fitness, muscular endurance and strength as measured by increased METs and functional capacity (6MWT) Short Term: Increase workloads from initial exercise prescription for resistance, speed, and METs.;Short Term: Perform resistance training exercises routinely during rehab and add in resistance training at home;Long Term: Improve  cardiorespiratory fitness, muscular endurance and strength as measured by increased METs and functional capacity (6MWT)      Able to understand and use rate of perceived exertion (RPE) scale Yes Yes      Intervention Provide education and explanation on how to use RPE scale Provide education and explanation on how to use RPE scale      Expected Outcomes Short Term: Able to use RPE daily in rehab to express subjective intensity level;Long Term:  Able to use RPE to guide intensity level when exercising independently Short Term: Able to use RPE daily in rehab to express subjective intensity level;Long Term:  Able to use RPE to guide intensity level when exercising independently      Able to understand and use Dyspnea scale Yes Yes      Intervention Provide education and explanation on how to use Dyspnea scale Provide education and explanation on how to use Dyspnea scale      Expected Outcomes Short Term: Able to use Dyspnea scale daily in rehab to express subjective sense of shortness of breath during exertion;Long Term: Able to use Dyspnea scale to guide intensity level when exercising independently Short Term: Able to use Dyspnea scale daily in rehab to express subjective sense of shortness of breath during exertion;Long Term: Able to use Dyspnea scale to guide intensity level when exercising independently      Knowledge and understanding of Target Heart Rate Range (THRR) Yes Yes      Intervention Provide education and explanation of THRR including how the numbers were predicted and where they are located for reference Provide education and explanation of THRR including how the numbers were predicted and where they are located for reference      Expected Outcomes Short Term: Able to state/look up THRR;Long Term: Able to use THRR to govern intensity when exercising independently;Short Term: Able to use daily as guideline for intensity in rehab Short Term: Able to state/look up THRR;Long Term: Able to use THRR  to govern intensity when exercising independently;Short Term: Able to use daily as guideline for intensity in rehab      Understanding of Exercise Prescription Yes Yes      Intervention Provide education, explanation, and written materials on patient's individual exercise prescription Provide education, explanation, and written materials on patient's individual exercise prescription      Expected Outcomes Short Term: Able to explain program exercise prescription;Long Term: Able to explain home exercise prescription to exercise independently Short Term: Able to explain program exercise prescription;Long Term: Able to explain home exercise prescription to exercise independently               Exercise Goals Re-Evaluation :  Exercise Goals Re-Evaluation     [name] 11/04/21 0852             Exercise Goal Re-Evaluation   Exercise Goals Review Increase Physical Activity;Increase Strength and Stamina;Able to understand and use rate of perceived exertion (RPE) scale;Able to understand and use Dyspnea scale;Knowledge and  understanding of Target Heart Rate Range (THRR);Understanding of Exercise Prescription       Comments [name] has completed 2 exericse sessions. She exercises for 15 min on the arm ergometer and Nustep. She averages 1.9 METs at level 1 on the arm ergometer and 1.5 METs at level 1 on the Nustep. [name] performs the warmup and cooldown standing without limitations. It is too soon to note any discernable progressions. Will continue to monitor and progress as able.       Expected Outcomes Through exercise at rehab and home, the patient will decrease shortness of breath with daily activities and feel confident in carrying out an exercise regimen at home.                Discharge Exercise Prescription (Final Exercise Prescription Changes):  Exercise Prescription Changes - 11/04/21 1500       Response to Exercise   Blood Pressure (Admit) 110/70    Blood Pressure (Exercise) 150/80     Blood Pressure (Exit) 129/52    Heart Rate (Admit) 63 bpm    Heart Rate (Exercise) 96 bpm    Heart Rate (Exit) 72 bpm    Oxygen Saturation (Admit) 97 %    Oxygen Saturation (Exercise) 96 %    Oxygen Saturation (Exit) 98 %    Rating of Perceived Exertion (Exercise) 15    Perceived Dyspnea (Exercise) 1    Duration Continue with 30 min of aerobic exercise without signs/symptoms of physical distress.    Intensity THRR unchanged      Resistance Training   Training Prescription Yes    Weight red bands    Reps 10-15      NuStep   Level 2    Minutes 15    METs 2.3      Arm Ergometer   Level 1.5    Minutes 15             Nutrition:  Target Goals: Understanding of nutrition guidelines, daily intake of sodium '1500mg'$ , cholesterol '200mg'$ , calories 30% from fat and 7% or less from saturated fats, daily to have 5 or more servings of fruits and vegetables.  Biometrics:  Pre Biometrics - 10/20/21 1050       Pre Biometrics   Grip Strength 11 kg              Nutrition Therapy Plan and Nutrition Goals:  Nutrition Therapy & Goals - 10/30/21 1538       Nutrition Therapy   Diet General Healthy Diet      Personal Nutrition Goals   Nutrition Goal Patient to build a healthy plate to include lean protein/plant protein, fruits, vegetables, whole grains, low fat dairy as part of a general healthful meal plan.      Intervention Plan   Intervention Prescribe, educate and counsel regarding individualized specific dietary modifications aiming towards targeted core components such as weight, hypertension, lipid management, diabetes, heart failure and other comorbidities.    Expected Outcomes Short Term Goal: Understand basic principles of dietary content, such as calories, fat, sodium, cholesterol and nutrients.;Long Term Goal: Adherence to prescribed nutrition plan.             Nutrition Assessments:  Nutrition Assessments - 10/29/21 0901       Rate Your Plate Scores   Pre  Score 69            MEDIFICTS Score Key: ?70 Need to make dietary changes  40-70 Heart Healthy Diet ? 40 Therapeutic Level Cholesterol  Diet  Flowsheet Row PULMONARY REHAB OTHER RESPIRATORY from 10/28/2021 in [place]  Picture Your Plate Total Score on Admission 69      Picture Your Plate Scores: <28 Unhealthy dietary pattern with much room for improvement. 41-50 Dietary pattern unlikely to meet recommendations for good health and room for improvement. 51-60 More healthful dietary pattern, with some room for improvement.  >60 Healthy dietary pattern, although there may be some specific behaviors that could be improved.    Nutrition Goals Re-Evaluation:   Nutrition Goals Discharge (Final Nutrition Goals Re-Evaluation):   Psychosocial: Target Goals: Acknowledge presence or absence of significant depression and/or stress, maximize coping skills, provide positive support system. Participant is able to verbalize types and ability to use techniques and skills needed for reducing stress and depression.  Initial Review & Psychosocial Screening:  Initial Psych Review & Screening - 10/20/21 1120       Initial Review   Current issues with Current Depression   is on meds     Family Dynamics   Good Support System? Yes    Comments spouse and daughter      Barriers   Psychosocial barriers to participate in program There are no identifiable barriers or psychosocial needs.      Screening Interventions   Interventions Encouraged to exercise             Quality of Life Scores:  Scores of 19 and below usually indicate a poorer quality of life in these areas.  A difference of  2-3 points is a clinically meaningful difference.  A difference of 2-3 points in the total score of the Quality of Life Index has been associated with significant improvement in overall quality of life, self-image, physical symptoms, and general health in studies assessing  change in quality of life.  PHQ-9: Review Flowsheet        10/20/2021 09/23/2021 07/31/2021 07/08/2021 06/02/2021  Depression screen PHQ 2/9  Decreased Interest 1   0 0 0 0 1  Down, Depressed, Hopeless '1   3 1 '$ 0 0 1  PHQ - 2 Score '2   3 1 '$ 0 0 2  Altered sleeping 1      Tired, decreased energy 2      Change in appetite 0      Feeling bad or failure about yourself  0      Trouble concentrating 0      Moving slowly or fidgety/restless 0      Suicidal thoughts 0      PHQ-9 Score 6      Difficult doing work/chores Somewhat difficult          Multiple values from one day are sorted in reverse-chronological order       Interpretation of Total Score  Total Score Depression Severity:  1-4 = Minimal depression, 5-9 = Mild depression, 10-14 = Moderate depression, 15-19 = Moderately severe depression, 20-27 = Severe depression   Psychosocial Evaluation and Intervention:  Psychosocial Evaluation - 10/20/21 1121       Psychosocial Evaluation & Interventions   Interventions Encouraged to exercise with the program and follow exercise prescription    Expected Outcomes For [name] to participate in Pulmonary rehab    Continue Psychosocial Services  Follow up required by staff             Psychosocial Re-Evaluation:  Psychosocial Re-Evaluation     [name] 11/11/21 [phone]  Psychosocial Re-Evaluation   Current issues with Current Depression       Comments Depression is stable on current medication, no signs of depression, no barriers or psychosocial concerns identified at this time.       Expected Outcomes For [name] to continue to be free of psychosocial concerns or barriers while participating in pulmonary rehab.       Interventions Encouraged to attend Pulmonary Rehabilitation for the exercise       Continue Psychosocial Services  No Follow up required                Psychosocial Discharge (Final Psychosocial Re-Evaluation):  Psychosocial Re-Evaluation - 11/11/21  0850       Psychosocial Re-Evaluation   Current issues with Current Depression    Comments Depression is stable on current medication, no signs of depression, no barriers or psychosocial concerns identified at this time.    Expected Outcomes For [name] to continue to be free of psychosocial concerns or barriers while participating in pulmonary rehab.    Interventions Encouraged to attend Pulmonary Rehabilitation for the exercise    Continue Psychosocial Services  No Follow up required             Education: Education Goals: Education classes will be provided on a weekly basis, covering required topics. Participant will state understanding/return demonstration of topics presented.  Learning Barriers/Preferences:   Education Topics: Risk Factor Reduction:  -Group instruction that is supported by a PowerPoint presentation. Instructor discusses the definition of a risk factor, different risk factors for pulmonary disease, and how the heart and lungs work together.     Nutrition for Pulmonary Patient:  -Group instruction provided by PowerPoint slides, verbal discussion, and written materials to support subject matter. The instructor gives an explanation and review of healthy diet recommendations, which includes a discussion on weight management, recommendations for fruit and vegetable consumption, as well as protein, fluid, caffeine, fiber, sodium, sugar, and alcohol. Tips for eating when patients are short of breath are discussed. Flowsheet Row PULMONARY REHAB OTHER RESPIRATORY from 11/06/2021 in [place]  Date 10/30/21  Educator Sam  Instruction Review Code 1- Verbalizes Understanding       Pursed Lip Breathing:  -Group instruction that is supported by demonstration and informational handouts. Instructor discusses the benefits of pursed lip and diaphragmatic breathing and detailed demonstration on how to preform both.     Oxygen Safety:   -Group instruction provided by PowerPoint, verbal discussion, and written material to support subject matter. There is an overview of "What is Oxygen" and "Why do we need it".  Instructor also reviews how to create a safe environment for oxygen use, the importance of using oxygen as prescribed, and the risks of noncompliance. There is a brief discussion on traveling with oxygen and resources the patient may utilize.   Oxygen Equipment:  -Group instruction provided by [facility] Staff utilizing handouts, written materials, and equipment demonstrations.   Signs and Symptoms:  -Group instruction provided by written material and verbal discussion to support subject matter. Warning signs and symptoms of infection, stroke, and heart attack are reviewed and when to call the physician/911 reinforced. Tips for preventing the spread of infection discussed.   Advanced Directives:  -Group instruction provided by verbal instruction and written material to support subject matter. Instructor reviews Advanced Directive laws and proper instruction for filling out document.   Pulmonary Video:  -Group video education that reviews the importance of medication and oxygen compliance,  exercise, good nutrition, pulmonary hygiene, and pursed lip and diaphragmatic breathing for the pulmonary patient.   Exercise for the Pulmonary Patient:  -Group instruction that is supported by a PowerPoint presentation. Instructor discusses benefits of exercise, core components of exercise, frequency, duration, and intensity of an exercise routine, importance of utilizing pulse oximetry during exercise, safety while exercising, and options of places to exercise outside of rehab.     Pulmonary Medications:  -Verbally interactive group education provided by instructor with focus on inhaled medications and proper administration.   Anatomy and Physiology of the Respiratory System and Intimacy:  -Group instruction provided by  PowerPoint, verbal discussion, and written material to support subject matter. Instructor reviews respiratory cycle and anatomical components of the respiratory system and their functions. Instructor also reviews differences in obstructive and restrictive respiratory diseases with examples of each. Intimacy, Sex, and Sexuality differences are reviewed with a discussion on how relationships can change when diagnosed with pulmonary disease. Common sexual concerns are reviewed.   MD DAY -A group question and answer session with a medical doctor that allows participants to ask questions that relate to their pulmonary disease state.   OTHER EDUCATION -Group or individual verbal, written, or video instructions that support the educational goals of the pulmonary rehab program. [place] from 11/06/2021 in [place]  Date 11/06/21  Educator Thompson Grayer handout]  Instruction Review Code 1- Verbalizes Understanding       Holiday Eating Survival Tips:  -Group instruction provided by PowerPoint slides, verbal discussion, and written materials to support subject matter. The instructor gives patients tips, tricks, and techniques to help them not only survive but enjoy the holidays despite the onslaught of food that accompanies the holidays.   Knowledge Questionnaire Score:  Knowledge Questionnaire Score - 10/20/21 1132       Knowledge Questionnaire Score   Pre Score 15/18             Core Components/Risk Factors/Patient Goals at Admission:  Personal Goals and Risk Factors at Admission - 10/20/21 1121       Core Components/Risk Factors/Patient Goals on Admission    Weight Management Weight Loss    Improve shortness of breath with ADL's Yes    Intervention Provide education, individualized exercise plan and daily activity instruction to help decrease symptoms of SOB with activities of daily living.    Expected Outcomes  Short Term: Improve cardiorespiratory fitness to achieve a reduction of symptoms when performing ADLs;Long Term: Be able to perform more ADLs without symptoms or delay the onset of symptoms             Core Components/Risk Factors/Patient Goals Review:   Goals and Risk Factor Review     [name] 11/11/21 [phone]             Core Components/Risk Factors/Patient Goals Review   Personal Goals Review Develop more efficient breathing techniques such as purse lipped breathing and diaphragmatic breathing and practicing self-pacing with activity.;Increase knowledge of respiratory medications and ability to use respiratory devices properly.;Improve shortness of breath with ADL's       Review Inika is 81 y/o and is progressing well with her exercise.  She has attended 4 exercise sessions and is @ 2.4 mets on the nustep 2 watts on the arm ergomenter. She seems to enjoy participating in pulmonary rehab.       Expected Outcomes See admission goals.  Core Components/Risk Factors/Patient Goals at Discharge (Final Review):   Goals and Risk Factor Review - 11/11/21 0852       Core Components/Risk Factors/Patient Goals Review   Personal Goals Review Develop more efficient breathing techniques such as purse lipped breathing and diaphragmatic breathing and practicing self-pacing with activity.;Increase knowledge of respiratory medications and ability to use respiratory devices properly.;Improve shortness of breath with ADL's    Review Lucely is 81 y/o and is progressing well with her exercise.  She has attended 4 exercise sessions and is @ 2.4 mets on the nustep 2 watts on the arm ergomenter. She seems to enjoy participating in pulmonary rehab.    Expected Outcomes See admission goals.             ITP Comments: Pt is making expected progress toward Pulmonary Rehab goals after completing 5 sessions. Recommend continued exercise, life style modification, education, and  utilization of breathing techniques to increase stamina and strength, while also decreasing shortness of breath with exertion.  Dr. Rodman Pickle is Medical Director for Pulmonary Rehab at [facility].

## 2021-11-13 ENCOUNTER — Encounter (HOSPITAL_COMMUNITY)
Admission: RE | Admit: 2021-11-13 | Discharge: 2021-11-13 | Disposition: A | Discharge status: Home / Self Care | Payer: Medicare Other | Source: Ambulatory Visit | Attending: Pulmonary Disease | Admitting: Pulmonary Disease

## 2021-11-13 DIAGNOSIS — R0602 Shortness of breath: Secondary | ICD-10-CM

## 2021-11-13 DIAGNOSIS — G9689 Other specified disorders of central nervous system: Secondary | ICD-10-CM | POA: Diagnosis not present

## 2021-11-13 DIAGNOSIS — R942 Abnormal results of pulmonary function studies: Secondary | ICD-10-CM

## 2021-11-13 DIAGNOSIS — G500 Trigeminal neuralgia: Secondary | ICD-10-CM | POA: Diagnosis not present

## 2021-11-13 DIAGNOSIS — G894 Chronic pain syndrome: Secondary | ICD-10-CM | POA: Diagnosis not present

## 2021-11-13 DIAGNOSIS — M797 Fibromyalgia: Secondary | ICD-10-CM | POA: Diagnosis not present

## 2021-11-13 NOTE — Progress Notes (Signed)
Daily Session Note  Patient Details  Name: [name] MRN: [id number] Date of Birth: January 23, 1941 Referring Provider:   April Manson Pulmonary Rehab Walk Test from 10/20/2021 in [place]  Referring Provider Loanne Drilling       Encounter Date: 11/13/2021  Check In:  Session Check In - 11/13/21 1420       Check-In   Supervising physician immediately available to respond to emergencies [facility] immediately available    Physician(s) Dr. Manuella Ghazi    Location [facility]    Staff Present Rosebud Poles, RN, BSN;Lisa Ysidro Evert, Cathleen Fears, MS, ACSM-CEP, Exercise Physiologist    Virtual Visit No    Medication changes reported     No    Fall or balance concerns reported    No    Tobacco Cessation No Change    Warm-up and Cool-down Performed as group-led instruction    Resistance Training Performed Yes    VAD Patient? No    PAD/SET Patient? No      VAD patient   Has back up controller? No    Has battery cables? No    Has compatible battery clips? No      Pain Assessment   Currently in Pain? No/denies    Multiple Pain Sites No             Capillary Blood Glucose: No results found for this or any previous visit (from the past 24 hour(s)).    Social History   Tobacco Use  Smoking Status Never  Smokeless Tobacco Never    Goals Met:  Proper associated with RPD/PD & O2 Sat Exercise tolerated well No report of concerns or symptoms today Strength training completed today  Goals Unmet:  Not Applicable   Comments: Service time is from 1318 to [place]    Dr. Rodman Pickle is Medical Director for Pulmonary Rehab at [facility].

## 2021-11-18 ENCOUNTER — Encounter (HOSPITAL_COMMUNITY)
Admission: RE | Admit: 2021-11-18 | Discharge: 2021-11-18 | Disposition: A | Discharge status: Home / Self Care | Payer: Medicare Other | Source: Ambulatory Visit | Attending: Pulmonary Disease | Admitting: Pulmonary Disease

## 2021-11-18 VITALS — Wt 151.5 lb

## 2021-11-18 DIAGNOSIS — G894 Chronic pain syndrome: Secondary | ICD-10-CM | POA: Diagnosis not present

## 2021-11-18 DIAGNOSIS — G9689 Other specified disorders of central nervous system: Secondary | ICD-10-CM | POA: Diagnosis not present

## 2021-11-18 DIAGNOSIS — M797 Fibromyalgia: Secondary | ICD-10-CM | POA: Diagnosis not present

## 2021-11-18 DIAGNOSIS — R942 Abnormal results of pulmonary function studies: Secondary | ICD-10-CM | POA: Diagnosis not present

## 2021-11-18 DIAGNOSIS — R0602 Shortness of breath: Secondary | ICD-10-CM

## 2021-11-18 DIAGNOSIS — G500 Trigeminal neuralgia: Secondary | ICD-10-CM | POA: Diagnosis not present

## 2021-11-18 NOTE — Progress Notes (Signed)
Daily Session Note  Patient Details  Name: [name] MRN: [id number] Date of Birth: 1940/07/01 Referring Provider:   April Manson Pulmonary Rehab Walk Test from 10/20/2021 in [place]  Referring Provider Loanne Drilling       Encounter Date: 11/18/2021  Check In:  Session Check In - 11/18/21 1417       Check-In   Supervising physician immediately available to respond to emergencies [facility] immediately available    Physician(s) Dr. Tawanna Solo    Location [facility]    Staff Present Rosebud Poles, RN, BSN;Lisa Ysidro Evert, RN;Olinty Celesta Aver, MS, ACSM CEP, Exercise Physiologist;Damonta Cossey Rosana Hoes, MS, ACSM-CEP, Exercise Physiologist    Virtual Visit No    Medication changes reported     No    Fall or balance concerns reported    No    Tobacco Cessation No Change    Warm-up and Cool-down Performed as group-led instruction    Resistance Training Performed Yes    VAD Patient? No    PAD/SET Patient? No      VAD patient   Has back up controller? No    Has spare charged batteries? No    Has battery cables? No    Has compatible battery clips? No      Pain Assessment   Currently in Pain? No/denies             Capillary Blood Glucose: No results found for this or any previous visit (from the past 24 hour(s)).   Exercise Prescription Changes - 11/18/21 1500       Response to Exercise   Blood Pressure (Admit) 118/60    Blood Pressure (Exercise) 142/70    Blood Pressure (Exit) 128/72    Heart Rate (Admit) 65 bpm    Heart Rate (Exercise) 79 bpm    Heart Rate (Exit) 68 bpm    Oxygen Saturation (Admit) 98 %    Oxygen Saturation (Exercise) 94 %    Oxygen Saturation (Exit) 97 %    Rating of Perceived Exertion (Exercise) 17    Perceived Dyspnea (Exercise) 0    Duration Continue with 30 min of aerobic exercise without signs/symptoms of physical distress.    Intensity THRR unchanged      Resistance Training   Training  Prescription Yes    Weight red bands    Reps 10-15    Time 10 Minutes      NuStep   Level 3    SPM 60    Minutes 15    METs 1.5      Arm Ergometer   Level 1    Watts 4    Minutes 15    METs 1.9             Social History   Tobacco Use  Smoking Status Never  Smokeless Tobacco Never    Goals Met:  Proper associated with RPD/PD & O2 Sat Exercise tolerated well No report of concerns or symptoms today Strength training completed today  Goals Unmet:  Not Applicable  Comments: Service time is from 1315 to 1432.    Dr. Rodman Pickle is Medical Director for Pulmonary Rehab at [facility].

## 2021-11-20 ENCOUNTER — Encounter (HOSPITAL_COMMUNITY)
Admission: RE | Admit: 2021-11-20 | Discharge: 2021-11-20 | Disposition: A | Discharge status: Home / Self Care | Payer: Medicare Other | Source: Ambulatory Visit | Attending: Pulmonary Disease | Admitting: Pulmonary Disease

## 2021-11-20 DIAGNOSIS — N189 Chronic kidney disease, unspecified: Secondary | ICD-10-CM | POA: Insufficient documentation

## 2021-11-20 DIAGNOSIS — R942 Abnormal results of pulmonary function studies: Secondary | ICD-10-CM

## 2021-11-20 DIAGNOSIS — J029 Acute pharyngitis, unspecified: Secondary | ICD-10-CM | POA: Insufficient documentation

## 2021-11-20 DIAGNOSIS — R42 Dizziness and giddiness: Secondary | ICD-10-CM | POA: Diagnosis not present

## 2021-11-20 DIAGNOSIS — G500 Trigeminal neuralgia: Secondary | ICD-10-CM | POA: Diagnosis not present

## 2021-11-20 DIAGNOSIS — G9689 Other specified disorders of central nervous system: Secondary | ICD-10-CM | POA: Insufficient documentation

## 2021-11-20 DIAGNOSIS — Z79899 Other long term (current) drug therapy: Secondary | ICD-10-CM | POA: Insufficient documentation

## 2021-11-20 DIAGNOSIS — M810 Age-related osteoporosis without current pathological fracture: Secondary | ICD-10-CM | POA: Diagnosis not present

## 2021-11-20 DIAGNOSIS — F32A Depression, unspecified: Secondary | ICD-10-CM | POA: Insufficient documentation

## 2021-11-20 DIAGNOSIS — Z7952 Long term (current) use of systemic steroids: Secondary | ICD-10-CM | POA: Insufficient documentation

## 2021-11-20 DIAGNOSIS — R400 Somnolence: Secondary | ICD-10-CM | POA: Diagnosis not present

## 2021-11-20 DIAGNOSIS — R0602 Shortness of breath: Secondary | ICD-10-CM | POA: Insufficient documentation

## 2021-11-20 NOTE — Progress Notes (Signed)
Daily Session Note  Patient Details  Name: [name] MRN: [id number] Date of Birth: 1940-11-18 Referring Provider:   April Manson Pulmonary Rehab Walk Test from 10/20/2021 in [place]  Referring Provider Loanne Drilling       Encounter Date: 11/20/2021  Check In:  Session Check In - 11/20/21 1425       Check-In   Supervising physician immediately available to respond to emergencies [facility] immediately available    Physician(s) Dr. Karleen Hampshire    Location [facility]    Staff Present Rosebud Poles, RN, BSN;Lisa Ysidro Evert, Cathleen Fears, MS, ACSM-CEP, Exercise Physiologist    Virtual Visit No    Medication changes reported     No    Fall or balance concerns reported    No    Tobacco Cessation No Change    Warm-up and Cool-down Performed as group-led instruction    Resistance Training Performed Yes    VAD Patient? No    PAD/SET Patient? No      VAD patient   Has back up controller? No    Has spare charged batteries? No    Has battery cables? No    Has compatible battery clips? No      Pain Assessment   Currently in Pain? No/denies    Multiple Pain Sites No             Capillary Blood Glucose: No results found for this or any previous visit (from the past 24 hour(s)).    Social History   Tobacco Use  Smoking Status Never  Smokeless Tobacco Never    Goals Met:  Proper associated with RPD/PD & O2 Sat Exercise tolerated well No report of concerns or symptoms today Strength training completed today  Goals Unmet:  Not Applicable  Comments: Service time is from [place] to [place]    Dr. Rodman Pickle is Medical Director for Pulmonary Rehab at [facility].

## 2021-11-25 ENCOUNTER — Encounter (HOSPITAL_COMMUNITY)
Admission: RE | Admit: 2021-11-25 | Discharge: 2021-11-25 | Disposition: A | Discharge status: Home / Self Care | Payer: Medicare Other | Source: Ambulatory Visit | Attending: Pulmonary Disease | Admitting: Pulmonary Disease

## 2021-11-25 DIAGNOSIS — R942 Abnormal results of pulmonary function studies: Secondary | ICD-10-CM

## 2021-11-25 DIAGNOSIS — Z79899 Other long term (current) drug therapy: Secondary | ICD-10-CM | POA: Diagnosis not present

## 2021-11-25 DIAGNOSIS — G500 Trigeminal neuralgia: Secondary | ICD-10-CM | POA: Diagnosis not present

## 2021-11-25 DIAGNOSIS — R0602 Shortness of breath: Secondary | ICD-10-CM

## 2021-11-25 DIAGNOSIS — R400 Somnolence: Secondary | ICD-10-CM | POA: Diagnosis not present

## 2021-11-25 DIAGNOSIS — F32A Depression, unspecified: Secondary | ICD-10-CM | POA: Diagnosis not present

## 2021-11-25 DIAGNOSIS — G9689 Other specified disorders of central nervous system: Secondary | ICD-10-CM | POA: Diagnosis not present

## 2021-11-25 DIAGNOSIS — M810 Age-related osteoporosis without current pathological fracture: Secondary | ICD-10-CM | POA: Diagnosis not present

## 2021-11-25 NOTE — Progress Notes (Signed)
Daily Session Note  Patient Details  Name: HIBO BLASDELL MRN: [id number] Date of Birth: February 11, 1941 Referring Provider:   April Manson Pulmonary Rehab Walk Test from 10/20/2021 in [place]  Referring Provider Loanne Drilling       Encounter Date: 11/25/2021  Check In:  Session Check In - 11/25/21 1353       Check-In   Supervising physician immediately available to respond to emergencies [facility] immediately available    Physician(s) Dr. Karleen Hampshire    [facility]    Staff Present Rosebud Poles, RN, BSN;Lorae Roig Ysidro Evert, Cathleen Fears, MS, ACSM-CEP, Exercise Physiologist    Virtual Visit No    Medication changes reported     No    Fall or balance concerns reported    No    Tobacco Cessation No Change    Warm-up and Cool-down Performed as group-led instruction    Resistance Training Performed Yes    VAD Patient? No    PAD/SET Patient? No      Pain Assessment   Multiple Pain Sites No             Capillary Blood Glucose: No results found for this or any previous visit (from the past 24 hour(s)).    Social History   Tobacco Use  Smoking Status Never  Smokeless Tobacco Never    Goals Met:  Exercise tolerated well No report of concerns or symptoms today Strength training completed today  Goals Unmet:  Not Applicable  Comments: Service time is from 1313 to [place]    Dr. Rodman Pickle is Medical Director for Pulmonary Rehab at [facility].

## 2021-11-27 ENCOUNTER — Encounter: Payer: Self-pay | Admitting: Physical Medicine and Rehabilitation

## 2021-11-27 ENCOUNTER — Encounter
Payer: Medicare Other | Attending: Physical Medicine and Rehabilitation | Admitting: Physical Medicine and Rehabilitation

## 2021-11-27 ENCOUNTER — Encounter (HOSPITAL_COMMUNITY)
Admission: RE | Admit: 2021-11-27 | Discharge: 2021-11-27 | Disposition: A | Discharge status: Home / Self Care | Payer: Medicare Other | Source: Ambulatory Visit | Attending: Pulmonary Disease | Admitting: Pulmonary Disease

## 2021-11-27 VITALS — Wt 150.6 lb

## 2021-11-27 VITALS — BP 132/76 | HR 66 | Ht 63.0 in | Wt 152.4 lb

## 2021-11-27 DIAGNOSIS — R0602 Shortness of breath: Secondary | ICD-10-CM

## 2021-11-27 DIAGNOSIS — R400 Somnolence: Secondary | ICD-10-CM

## 2021-11-27 DIAGNOSIS — M810 Age-related osteoporosis without current pathological fracture: Secondary | ICD-10-CM | POA: Diagnosis not present

## 2021-11-27 DIAGNOSIS — F32A Depression, unspecified: Secondary | ICD-10-CM | POA: Diagnosis not present

## 2021-11-27 DIAGNOSIS — R942 Abnormal results of pulmonary function studies: Secondary | ICD-10-CM

## 2021-11-27 DIAGNOSIS — G9689 Other specified disorders of central nervous system: Secondary | ICD-10-CM

## 2021-11-27 DIAGNOSIS — G500 Trigeminal neuralgia: Secondary | ICD-10-CM | POA: Diagnosis not present

## 2021-11-27 DIAGNOSIS — Z79899 Other long term (current) drug therapy: Secondary | ICD-10-CM | POA: Diagnosis not present

## 2021-11-27 NOTE — Progress Notes (Signed)
Subjective:    Patient ID: [name], female    DOB: [birth year], 81 y.o.   MRN: [id number]  HPI  [name] presents today for follow-up of severe trigeminal neuralgia.   1) Severe trigeminal neuralgia: -she is still not doing well. -has been feeling worse -she went back to the dentist because it was urting between two teeth and he said there is nothing wrong with those teeth and it is the trigeminal nerve When she took a shower that usually seems to help with but was migrating around her face.  -appointment with neurology in July -Dr. Lebron Quam cancelled the zoom call in April but this was rescheduled to July.  -she finds that only the gabapentin helps -she is not sure if the fentanyl helps When she is feeling really bad she tries to take another one but she gets very sleepy and dizzy.  -she is taking 8 a day of '100mg'$  per day- she takes two gabapentin and tylenol and hydrocodone in the morning. She takes another two at breakfast around 11, another 2 at lunch around 1:30pm.  -she had a recent root canal and pain has become much more severe after that -her pain is in the two teeth next to where she had the root canal -Oragel heps for a bout an hour.  -she tried increasing her Gabapentin to '300mg'$  TID but she could not tolerate this. It made her too sleepy.  -she contacted Dr. Davy Pique and he recommended she pause wean of Fentanyl patch and I agree with this -she took last patch last night. She still has 12.9mg patch available -her husband asks whether she should take a higher dose of gabapentin -the fentanyl patch makes her very sleepy and so allows her to sleep. It has also made her dizzier.  -the 558m fentanyl patch did not provide more relief- she would like to wean off. Weaned to 25, then 12.47m80m then off this week -[facility] not to Dr. WolJuel Burrowe states and they need a new referral send to Dr. LieVan Clineshe is using the compound cream and this  helps- she uses this twice per day -capsaicin made it burn  -lamotrigine makes her too sleepy and dizzy to come down the stairs by herself and she does not feel comfortable going to the shower -left eye more open than usual. -she does not feel she is getting relief from the fentanyl patch -she feels that the procedure at [facility] is a last resort.  -Given darkening of skin and worsening eye lid closure, MRI brain stat obtained and shows new cyst that could be post-surgical. Recommended she go to ED given worsening dizziness and facial swelling as well. ED physician discussed case with NSGY and neurology and discharged home recommending follow-up with Dr. EicGean Quintd Dr. OstMerceda Elkshe was given IV Fentanyl in the ED which helped and discharged on 38m61mentanyl patch, which has not helped.  -discussed increasing Fentanyl patch to 247mc30mnce she has not experienced any negative side effects, no respiratory depression, and her husband is agreeable. I have sent in new script to start Saturday, but insurance required prior auth, which was approved.  -tonight would be the third tay of the [id number] patch -she is still taking the Gabapentin -she feels that when she lays down she is spinning.  -eye continues to appear more closed.  -Husband has called Dr. EichmaDionne Anor. OstegaEarlie Ravelinge to scheduled follow-up appointments and has appointments with both  tomorrow.  -Will run out of Fentanyl patch by Sunday -pain is worsening and nothing is providing relief -she asks whether she should go to ED -she has also noted dizziness and is not sure if this is from being in the MRI or from the [place].  -morphine was not effective and caused itching, sarna lotion did help wit this.  -hydrocodone has been most effective thus far -nucynta was not effective but she is not sure whether she gave it a fair trial -she has month appointments scheduled with Zella Ball and f/u with me in Feb -she used the Norco  last night and this morning with good benefit and has started to feel the pain return this afternoon -she did find benefit from the compound cream- she continues to use this.  -She continues to have a sharp shooting pain doing around her eyelid and the tip teeth and in the top of her head.  -has a radiofrequency ablation treatment that helped but not completely -she is not having as much sharp knife-like pin like in her eye Her top teeth in her left law have been hurting really badly.  -she now takes the baclofen with the gabapentin and they seem to work well together.  -she had surgical procedure and feels worse -yesterday she took a half a tablet but this morning she took a whole one. She does fine with the hydrocodone but a whole one makes her too sleepy.  -she is taking 2 '100mg'$  Gabapentin three times per day but it makes her dizzy and sleepy so she cannot funciton -the cold worsens her pain.  -The IV fentanyl did help with the pain.  -her husband feels she should get one more dose of the '25mg'$  fentanyl. -she did get one week benefit from the steroid injection.  -she is doing terribly -she is miserable -she feels depressed due to the pain -she has considered taking the whole bottle of hydrocodone because because she can't take this pain anymore.  -she started taking the Gabapentin '300mg'$  TID.  -she does find relief from the hydrocodone -the pain is constant -she wonders if it is inflammation.  -she has not tried turmeric. Her son-in-law gave her a bottle and she couldn't stomach it.   2) Fibromyalgia: -She has been almost nonfunctional due to the severity of her pain. It continues to be very severe.  -She would like to wean off the Gabapentin as she feels at risk of falls -she is taking Topamax BID. She felt this was helpful but made her unsteady on her feet.  -still using gabapentin and this is helping.   3) Osteoporosis: -husband asks whether she should get a Prolia shot.   4)  Dizziness: -head was swimming -she felt like she was going to fall.  -worsening -feels like she is spinning, not the room  -She tried the Phenytoin for 2 days and did not feel benefit so stopped it.   -she feels the gabapentin aggravates this and she would love to wean off but knows it helps her.   -She feels very sleepy during the day.   -She has never tried Topirmate before.   5) Sore throat -developed stopping lamotrigine since not helping.   Prior history:  Her pain continues to be in her left eye, forehead. She has not tried any topicals for pain. She uses capsaicin for her back and knees.   Since last visit nothing we have tried has helped with her pain. She has tried to decrease her Gabapentin  dose but then her pain increased so she has resumed taking 4 Gabapentin per day.   Her husband asks whether he should contact her surgeon regarding whether nerve may have become unclamped.   Discussed prolotherapy, trigger point injections, Baclofen, Clonazepam, Phenytoin as other options we could try,=.  Pain continues to be excruciating and she states she is not sure how much longer she can live like this.   Prior history: She has having some nausea, lightheadedness and felt this may be related to her Gabapentin. She did not take any yesterday and then felt more sever pain in the evening. She also gets electrical shooting across her jaw.   She does not feel that she has cluster headaches as the pain has been so constant. She feels that she is getting worse. The oxygen made her feel relaxed but it is not helping.   The Sarna lotion does help with the itching in her head.   She was willing to try the Amitriptyline but it makes her too sleepy in the morning.   Her teeth and left eye incredibly hurt.   She feels that the Gabapentin has been making her dizziness.   She asks about biofeedback.   The pain is 7/10 on average, similar to last time.   When the pain is severe it can  make her depressed.   Pain Inventory Pain Right Now 10 My pain is constant, sharp, burning, stabbing, and aching  In the last 24 hours, has pain interfered with the following? General activity 10 Relation with others 8 Enjoyment of life 10 What TIME of day is your pain at its worst? morning  and evening Sleep (in general) Good  Pain is worse with: bending and standing Pain improves with: rest, heat/ice, and medication Relief from Meds: 2  Family History  Problem Relation Age of Onset   Heart disease Mother    Heart disease Father    Stroke Father    Stroke Sister    Asthma Daughter    Colon cancer Neg Hx    Social History   Socioeconomic History   Marital status: Married    Spouse name: Not on file   Number of children: 2   Years of education: college   Highest education level: Not on file  Occupational History   Occupation: housewife    Employer: RETIRED  Tobacco Use   Smoking status: Never   Smokeless tobacco: Never  Vaping Use   Vaping Use: Never used  Substance and Sexual Activity   Alcohol use: No    Alcohol/week: 0.0 standard drinks of alcohol   Drug use: No   Sexual activity: Not on file  Other Topics Concern   Not on file  Social History Narrative   1 cup of Jasmine tea daily    Social Determinants of Health   Financial Resource Strain: Not on file  Food Insecurity: Not on file  Transportation Needs: Not on file  Physical Activity: Not on file  Stress: Not on file  Social Connections: Not on file   Past Surgical History:  Procedure Laterality Date   CHOLECYSTECTOMY  03/2010   COLONOSCOPY  2009   Dr. Raford Pitcher    CYSTOSCOPY     Multiple Cystoscopies with stents or biopsy    ESOPHAGOGASTRODUODENOSCOPY  10/2013   EYE SURGERY     bilateral cataract removal   Gamma knife radiation  01/2018   for trigeminal neuralgia   REFRACTIVE SURGERY Bilateral 2022   RHIZOTOMY Left 04/28/2021  Procedure: Left Percutaneous trigeminal balloon  rhizotomy;  Surgeon: Judith Part, MD;  Location: [facility];  Service: Neurosurgery;  Laterality: Left;   TONSILLECTOMY     Past Surgical History:  Procedure Laterality Date   CHOLECYSTECTOMY  03/2010   COLONOSCOPY  2009   Dr. Raford Pitcher    CYSTOSCOPY     Multiple Cystoscopies with stents or biopsy    ESOPHAGOGASTRODUODENOSCOPY  10/2013   EYE SURGERY     bilateral cataract removal   Gamma knife radiation  01/2018   for trigeminal neuralgia   REFRACTIVE SURGERY Bilateral 2022   RHIZOTOMY Left 04/28/2021   Procedure: Left Percutaneous trigeminal balloon rhizotomy;  Surgeon: Judith Part, MD;  Location: [place];  Service: Neurosurgery;  Laterality: Left;   TONSILLECTOMY     Past Medical History:  Diagnosis Date   Adenomatous colon polyp 03/24/2018   Allergy    Arthritis    Cataract    Chronic headaches    Depression    Fibromyalgia    Gallstones    GERD (gastroesophageal reflux disease)    Hyperlipemia    Hypertension    Interstitial cystitis    Lactose intolerance    LBBB (left bundle branch block)    Osteoarthritis    Osteopenia    Pneumonia    Thyroid disease    Tinnitus    Trigeminal neuralgia    Ht '5\' 3"'$  (1.6 m)   BMI 26.83 kg/m   Opioid Risk Score:   Fall Risk Score:  `1  Depression screen PHQ 2/9     10/20/2021    1:08 PM 10/20/2021   11:16 AM 09/23/2021    1:56 PM 07/31/2021   11:54 AM 07/08/2021    3:05 PM 06/02/2021   10:41 AM 05/06/2021   11:06 AM  Depression screen PHQ 2/9  Decreased Interest 1 0 0 0 0 1 0  Down, Depressed, Hopeless '1 3 1 '$ 0 0 1   PHQ - 2 Score '2 3 1 '$ 0 0 2 0  Altered sleeping  1     0  Tired, decreased energy  2       Change in appetite  0       Feeling bad or failure about yourself   0       Trouble concentrating  0       Moving slowly or fidgety/restless  0       Suicidal thoughts  0       PHQ-9 Score  6     0  Difficult doing work/chores  Somewhat difficult         Review of Systems  Constitutional: Negative.    HENT: Negative.    Eyes:  Positive for visual disturbance.  Respiratory: Negative.    Cardiovascular: Negative.   Gastrointestinal: Negative.   Endocrine: Negative.   Genitourinary: Negative.   Musculoskeletal: Negative.        Pain in left side of face  Skin: Negative.   Allergic/Immunologic: Negative.   Neurological:  Positive for dizziness, tremors, light-headedness and numbness.       Tingling   Hematological: Negative.   Psychiatric/Behavioral: Negative.    All other systems reviewed and are negative.     Objective:   Physical Exam Not performed as patient was seen via phone visit    Assessment & Plan:  [name] is an 81 year old woman who presents for f/u of left sided trigeminal neuralgia and dizziness.    1) Sensory deafferation pain,  left sided trigeminal neuralgia vs cluster headaches (she does have lacrimation and runny nose on that side).  She does have a history of migraines.  -continue Gabapentin '100mg'$  up to 8 times a day, discussed her current schedule of takin -continue Fentanyl patch to 36mg and she is agreeable. Discussed that phenytoin, carbamazepine -discussed using Oragel multiple times per ay since this helps for an hour, use the topical cream intermittently in between -[facility] with [facility]. Unfortunately Dr. LVan Clineshas retired but was able to set her up with Dr. SChristin Bachwho specializes in migraines and trigeminal neuralgia on July 25th. Let patient and husband know. They will mail her their address.  -restart 12.546m fentanyl patch as patient does not feel she can get through weekend with this level of pain -commended her for calling Dr. EiDavy Piques well to let him know how she is feeling -recommended taking 1 hydrocodone now for the severe pain, and discussed that the Fentanyl patch with take 12-24 hours to kick in -prescribed Narcan while on Fentanyl -discussed with April the neurology  referral and she did specify Dr. WoJuel Burrownd [facility] -discussed CBD oil, her son bought her some of this and she felt that it greatly helped  Recommend topical CBD oil- discussed its benefits in reducing inflammation, pain, insomnia, and anxiety, but she stopped because effect wore of.  -Discussed that CBD oil differs from marijuana in that it does not contain THC- the substance that causes euphoria.  -Discussed that it is made from the hemp plant.  -It has been used for thousands of years -preliminary research suggests that if may be able to shrink cancerous tumors, stop plaque formation in Alzheimer's Disease, and slow the progress of brain disease from concussions.  -Additional benefits that have been demonstrated in studies include improved nausea, indigestion, and brain health, and reduced seizures.  -In a survey 92% of patients who tried medical cannabis felt it improved symptoms such as chronic pain, arthritis, migraines, and cancer.   -Provided with a pain relief journal and discussed that it contains foods and lifestyle tips to naturally help to improve pain. Discussed that these lifestyle strategies are also very good for health unlike some medications which can have negative side effects. Discussed that the act of keeping a journal can be therapeutic and helpful to realize patterns what helps to trigger and alleviate pain.   -discussed with Dr. OsJoaquim Namrigeminal deafferation pain, discussed the difference between this and trigeminal neuralgia with patient and husband, discussed motor cortex stimulation. -discussed whether or not to continue fentanyl patch, husband does feel that the IV fentanyl  -encouraged following with Dr. Sweet/Dr. MiSabra Heckt [facility] to try procedure recommended by D. Ostegard.  -stop lamotrigine as it is making her dizzy without benefit -reviewed patient's medical notes -discussed her progress with getting a referral to  [facility] -recommended going to ED given darkening of skin, worsening eye closure, finding of cyst, dizziness to receive prompt neurosurgical eval regarding if surgery is necessary, and also for pain management given worsening pain refractory to all treatments and resulting suicidality. Reviewed ED doc notes- he discussed with on-call neurology and NSGY and determined that patient could be discharged with outpatient follow-up. IV fentanyl was administered with relief to patient. She was discharged on fentanyl patch which has not provided relief. She is getting 1276m no side effects- discussed increasing to 67m8mnd her husband is in  agreement. Prior auth completed and approved. Discussed with husband starting patch on Satruday after she has worn 31mg patch for 72 hours -encouraged follow-up with Dr. OJoaquim Nam husband let me know appointment has been set up 1/18.  -recommended follow-up with Dr. 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  -Discussed that turmeric  -discussed steroid taper. -continue savella, husband feels like it is helping with her suicidal thoughts. Increase to '25mg'$ .  -continue hydrocodone.  -discuss mouth guard with dentist.  -d/c topamax -she does have another procedure scheduled. -discussed benefits of infrared light.  -Continue Norco '5mg'$  TID PRN -follow-up monthly with EZella Balland with me in February, placed on waitlist for earlier appointment with me, advised phone visits/MyChart are a great way to communicate in the interim -discussed that she can take an additional one or two tylenol for breakthrough pain -recommended checking CMP for liver enzymes with next  labs -prescribed NAC '600mg'$  BID to protect the liver while taking tylenol, discussed its health benefits for mitochondrial function -d/c Baclofen  -f/u with Dr. OJoaquim Namand Dr. EDavy Pique1/18 -discussed retrying carbamazepine to see if it is effective for her. It is $89 for her, so we discussed trying Lamictal instead.  -encouraged anti-inflammatory diet.  -she had sinusitis that was viral and was treated with antibiotics. This was in the early 970s The symptoms resolved after 2 months. She uses to get sinus infections and saline rinse.  -She has had multiple root canals.  -Discontinue 100% oxygen via facemask as this was not helpful.  -Discussed that unfortunately Sprint PNS is not indicated for craniofacial pain. -Amitriptyline and Lyrica did not help.  -Continue Cymbalta '60mg'$ .  -Topiramate helped but caused dizziness.  -Provided with hand out with information regarding trigeminal neuralgia -Failed Penytoin.  -trial lidocaine patch -She would like to try biofeedback. -reviewed neurology note.  -referred to Dr. WJuel Burrow  -will try trigger point injections next visit.  -Continue Ketamine 10%, Baclofen 2%, Cyclobenzaprine 2%, Ketoprofen 10%, Gabapentin 6%, Bupivacaine 1%, Amitryptiline 5% to apply to painful areas- using 3-4 times per day.  -Can consider Meloxicam if Phenytoin does not help.  -Discussed Qutenza as an option for neuropathic pain control. Discussed that this is a capsaicin patch, stronger than capsaicin cream. Discussed that it is currently approved for diabetic peripheral neuropathy and post-herpetic neuralgia, but that it has also shown benefit in treating other forms of neuropathy. Provided patient with link to site to learn more about the patch: hCinemaBonus.fr Discussed that the patch would be placed in office and benefits usually last 3 months. Discussed that unintended exposure to capsaicin can cause severe irritation of eyes, mucous membranes, respiratory  tract, and skin, but that Qutenza is a local treatment and does not have the systemic side effects of other nerve medications. Discussed that there may be pain, itching, erythema, and decreased sensory function associated with the application of Qutenza. Side effects usually subside within 1 week. A cold pack of analgesic medications can help with these side effects. Blood pressure can also be increased due to pain associated with administration of the patch.  -she cut down on her gabapentin due to her dizziness    Turmeric to reduce inflammation--can be used in cooking or taken as a supplement.  Benefits of turmeric:  -Highly anti-inflammatory  -Increases antioxidants  -Improves memory, attention, brain disease  -Lowers risk of heart disease  -May help prevent cancer  -Decreases pain  -Alleviates depression  -Delays aging and decreases risk of chronic disease  -Consume with black pepper to increase absorption    Turmeric Milk Recipe:  1 cup milk  1 tsp turmeric  1 tsp cinnamon  1 tsp grated ginger (optional)  Black pepper (boosts the anti-inflammatory properties of turmeric).  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  -she is a good metabolizer of amitriptyline and has tolerated nortrypilline in the past, but without much benefit.    3) CKD: encouraged 6-8 glasses of water per day. She  states that she had AKI with Ibuprofen in the past. Discussed checking Cr level next visit if we are considering Meloxicam -Discussed that Nucynta, and other pain medications, may stay in her bloodstream longer given her CKD   4) Hypernatremia: encouraged hydration   5) General health and pain: provided with list of healthy foods that are both nutritious and fight pain.   6) Depressed: Amitriptyline did help with this but she could not tolerate the drowsiness. Encouraged focusing on the factors that she can control, such as anti-inflammatory diet.   7) Fibromyalgia: Continue '30mg'$  of steroids per day for fibromyalgia. She is on that intermittently. She started this again about a month ago. She has been on this about a year. Continue Savella -discussed benefits of cold water showers.   8) Dizziness: -discussed that this could be from the combination of gabapentin and baclofen.  -discussed that all the medications we are trying for pain may worsen the dizziness  9) Constipation:  -Provided list of following foods that help with constipation and highlighted a few: 1) prunes- contain high amounts of fiber.  2) apples- has a form of dietary fiber called pectin that accelerates stool movement and increases beneficial gut bacteria 3) pears- in addition to fiber, also high in fructose and sorbitol which have laxative effect 4) figs- contain an enzyme ficin which helps to speed colonic transit 5) kiwis- contain an enzyme actinidin that improves gut motility and reduces constipation 6) oranges- rich in pectin (like apples) 7) grapefruits- contain a flavanol naringenin which has a laxative effect 8) vegetables- rich in fiber and also great sources of folate, vitamin C, and K 9) artichoke- high in inulin, prebiotic great for the microbiome 10) chicory- increases stool frequency and softness (can be added to coffee) 11) rhubarb- laxative effect 12) sweet potato- high fiber 13) beans, peas, and  lentils- contain both soluble and insoluble fiber 14) chia seeds- improves intestinal health and gut flora 15) flaxseeds- laxative effect 16) whole grain rye bread- high in fiber 17) oat bran- high in soluble and insoluble fiber 18) kefir- softens stools -recommended to try at least one of these foods every day.  -drink 6-8 glasses of water per day -walk regularly, especially after meals.  10) Osteopenia - was tapered off the prednisone -discussed that currently her pain may be more of a risk to her than osteopenia given her suicidal ideation from her pain -discussed risk of fall with the medications that make her dizziness.   11) Daytime sleepiness -discussed that this is secondary to her medications.  -discussed stimulant to keep her more awake during the day  10 minutes spent in discussion of her worsening pain since her root canal, location of pain in the two teeth next to the root canal tooth, recommending to increase Fentanyl patch to 37mg, use Oragel more frequently

## 2021-11-27 NOTE — Addendum Note (Signed)
Addended by: Izora Ribas on: 11/27/2021 10:51 AM   Modules accepted: Orders

## 2021-11-27 NOTE — Progress Notes (Signed)
Daily Session Note  Patient Details  Name: [name] MRN: [id number] Date of Birth: 1941-06-07 Referring Provider:   April Manson Pulmonary Rehab Walk Test from 10/20/2021 in [place]  Referring Provider Loanne Drilling       Encounter Date: 11/27/2021  Check In:  Session Check In - 11/27/21 1332       Check-In   Supervising physician immediately available to respond to emergencies [facility] immediately available    Physician(s) Dr. Cyndia Skeeters    Location [facility]    Staff Present Rosebud Poles, RN, BSN;Talan Gildner Ysidro Evert, Cathleen Fears, MS, ACSM-CEP, Exercise Physiologist    Virtual Visit No    Medication changes reported     No    Fall or balance concerns reported    No    Tobacco Cessation No Change    Warm-up and Cool-down Performed as group-led instruction    Resistance Training Performed Yes    VAD Patient? No    PAD/SET Patient? No      Pain Assessment   Currently in Pain? No/denies    Multiple Pain Sites No             Capillary Blood Glucose: No results found for this or any previous visit (from the past 24 hour(s)).    Social History   Tobacco Use  Smoking Status Never  Smokeless Tobacco Never    Goals Met:  Exercise tolerated well No report of concerns or symptoms today Strength training completed today  Goals Unmet:  Not Applicable  Comments: Service time is from 1315 to [place]    Dr. Rodman Pickle is Medical Director for Pulmonary Rehab at [facility].

## 2021-11-28 DIAGNOSIS — H353131 Nonexudative age-related macular degeneration, bilateral, early dry stage: Secondary | ICD-10-CM | POA: Diagnosis not present

## 2021-12-01 ENCOUNTER — Other Ambulatory Visit: Payer: Self-pay

## 2021-12-01 ENCOUNTER — Telehealth: Payer: Self-pay

## 2021-12-01 DIAGNOSIS — M170 Bilateral primary osteoarthritis of knee: Secondary | ICD-10-CM

## 2021-12-01 DIAGNOSIS — G500 Trigeminal neuralgia: Secondary | ICD-10-CM | POA: Diagnosis not present

## 2021-12-01 NOTE — Telephone Encounter (Signed)
Called and left a Vm for patient to [facility] to schedule with Dr. Marlou Sa for gel injection.  Check referrals tab

## 2021-12-02 ENCOUNTER — Encounter (HOSPITAL_COMMUNITY): Payer: Medicare Other

## 2021-12-02 DIAGNOSIS — N958 Other specified menopausal and perimenopausal disorders: Secondary | ICD-10-CM | POA: Diagnosis not present

## 2021-12-02 DIAGNOSIS — R29890 Loss of height: Secondary | ICD-10-CM | POA: Diagnosis not present

## 2021-12-02 DIAGNOSIS — D2262 Melanocytic nevi of left upper limb, including shoulder: Secondary | ICD-10-CM | POA: Diagnosis not present

## 2021-12-02 DIAGNOSIS — Z1231 Encounter for screening mammogram for malignant neoplasm of breast: Secondary | ICD-10-CM | POA: Diagnosis not present

## 2021-12-02 DIAGNOSIS — M8588 Other specified disorders of bone density and structure, other site: Secondary | ICD-10-CM | POA: Diagnosis not present

## 2021-12-02 DIAGNOSIS — Z8262 Family history of osteoporosis: Secondary | ICD-10-CM | POA: Diagnosis not present

## 2021-12-02 DIAGNOSIS — Z85828 Personal history of other malignant neoplasm of skin: Secondary | ICD-10-CM | POA: Diagnosis not present

## 2021-12-02 DIAGNOSIS — Z6826 Body mass index (BMI) 26.0-26.9, adult: Secondary | ICD-10-CM | POA: Diagnosis not present

## 2021-12-02 DIAGNOSIS — D225 Melanocytic nevi of trunk: Secondary | ICD-10-CM | POA: Diagnosis not present

## 2021-12-02 DIAGNOSIS — Z01419 Encounter for gynecological examination (general) (routine) without abnormal findings: Secondary | ICD-10-CM | POA: Diagnosis not present

## 2021-12-02 DIAGNOSIS — D692 Other nonthrombocytopenic purpura: Secondary | ICD-10-CM | POA: Diagnosis not present

## 2021-12-02 DIAGNOSIS — L821 Other seborrheic keratosis: Secondary | ICD-10-CM | POA: Diagnosis not present

## 2021-12-04 ENCOUNTER — Other Ambulatory Visit: Payer: Self-pay | Admitting: Physical Medicine and Rehabilitation

## 2021-12-04 ENCOUNTER — Encounter (HOSPITAL_COMMUNITY)
Admission: RE | Admit: 2021-12-04 | Discharge: 2021-12-04 | Disposition: A | Discharge status: Home / Self Care | Payer: Medicare Other | Source: Ambulatory Visit | Attending: Pulmonary Disease | Admitting: Pulmonary Disease

## 2021-12-04 DIAGNOSIS — G9689 Other specified disorders of central nervous system: Secondary | ICD-10-CM | POA: Diagnosis not present

## 2021-12-04 DIAGNOSIS — G500 Trigeminal neuralgia: Secondary | ICD-10-CM | POA: Diagnosis not present

## 2021-12-04 DIAGNOSIS — R400 Somnolence: Secondary | ICD-10-CM | POA: Diagnosis not present

## 2021-12-04 DIAGNOSIS — Z79899 Other long term (current) drug therapy: Secondary | ICD-10-CM | POA: Diagnosis not present

## 2021-12-04 DIAGNOSIS — R0602 Shortness of breath: Secondary | ICD-10-CM

## 2021-12-04 DIAGNOSIS — M810 Age-related osteoporosis without current pathological fracture: Secondary | ICD-10-CM | POA: Diagnosis not present

## 2021-12-04 DIAGNOSIS — R942 Abnormal results of pulmonary function studies: Secondary | ICD-10-CM

## 2021-12-04 DIAGNOSIS — F32A Depression, unspecified: Secondary | ICD-10-CM | POA: Diagnosis not present

## 2021-12-04 NOTE — Progress Notes (Signed)
Daily Session Note  Patient Details  Name: [name] MRN: [id number] Date of Birth: [birth year] Referring Provider:   April Manson Pulmonary Rehab Walk Test from 10/20/2021 in [place]  Referring Provider Loanne Drilling       Encounter Date: 12/04/2021  Check In:  Session Check In - 12/04/21 1429       Check-In   Supervising physician immediately available to respond to emergencies [facility] immediately available    Physician(s) Dr. Cyndia Skeeters    Location [facility]    Staff Present Rodney Langton, Cathleen Fears, MS, ACSM-CEP, Exercise Physiologist;Jetta Gilford Rile BS, ACSM EP-C, Exercise Physiologist    Virtual Visit No    Medication changes reported     No    Fall or balance concerns reported    No    Tobacco Cessation No Change    Warm-up and Cool-down Performed as group-led instruction    Resistance Training Performed Yes    VAD Patient? No    PAD/SET Patient? No      Pain Assessment   Currently in Pain? No/denies             Capillary Blood Glucose: No results found for this or any previous visit (from the past 24 hour(s)).    Social History   Tobacco Use  Smoking Status Never  Smokeless Tobacco Never    Goals Met:  Exercise tolerated well No report of concerns or symptoms today Strength training completed today  Goals Unmet:  Not Applicable  Comments: Service time is from 1320 to 1440    Dr. Rodman Pickle is Medical Director for Pulmonary Rehab at [facility]

## 2021-12-05 ENCOUNTER — Other Ambulatory Visit: Payer: Self-pay | Admitting: Physical Medicine and Rehabilitation

## 2021-12-05 MED ORDER — FENTANYL 25 MCG/HR TD PT72: 1.0000 | MEDICATED_PATCH | TRANSDERMAL | 0 refills | Status: DC

## 2021-12-08 ENCOUNTER — Other Ambulatory Visit: Payer: Self-pay | Admitting: Physical Medicine and Rehabilitation

## 2021-12-08 ENCOUNTER — Telehealth: Payer: Self-pay

## 2021-12-08 MED ORDER — HYDROCODONE-ACETAMINOPHEN 5-325 MG PO TABS: 1.0000 | ORAL_TABLET | Freq: Four times a day (QID) | ORAL | 0 refills | Status: DC | PRN

## 2021-12-08 MED ORDER — FENTANYL 25 MCG/HR TD PT72: 1.0000 | MEDICATED_PATCH | TRANSDERMAL | 0 refills | Status: DC

## 2021-12-08 NOTE — Telephone Encounter (Signed)
Patient husband called stating prescription for Fentanyl was sent to the wrong pharmacy. Sent to [facility] but should have been sent to [facility]. Cancelled at [facility]. [facility].

## 2021-12-09 ENCOUNTER — Telehealth: Payer: Self-pay

## 2021-12-09 ENCOUNTER — Encounter (HOSPITAL_COMMUNITY)
Admission: RE | Admit: 2021-12-09 | Discharge: 2021-12-09 | Disposition: A | Discharge status: Home / Self Care | Payer: Medicare Other | Source: Ambulatory Visit | Attending: Pulmonary Disease | Admitting: Pulmonary Disease

## 2021-12-09 DIAGNOSIS — G9689 Other specified disorders of central nervous system: Secondary | ICD-10-CM | POA: Diagnosis not present

## 2021-12-09 DIAGNOSIS — M810 Age-related osteoporosis without current pathological fracture: Secondary | ICD-10-CM | POA: Diagnosis not present

## 2021-12-09 DIAGNOSIS — Z79899 Other long term (current) drug therapy: Secondary | ICD-10-CM | POA: Diagnosis not present

## 2021-12-09 DIAGNOSIS — R942 Abnormal results of pulmonary function studies: Secondary | ICD-10-CM

## 2021-12-09 DIAGNOSIS — F32A Depression, unspecified: Secondary | ICD-10-CM | POA: Diagnosis not present

## 2021-12-09 DIAGNOSIS — G500 Trigeminal neuralgia: Secondary | ICD-10-CM | POA: Diagnosis not present

## 2021-12-09 DIAGNOSIS — R400 Somnolence: Secondary | ICD-10-CM | POA: Diagnosis not present

## 2021-12-09 DIAGNOSIS — R0602 Shortness of breath: Secondary | ICD-10-CM

## 2021-12-09 NOTE — Telephone Encounter (Signed)
ERROR

## 2021-12-09 NOTE — Progress Notes (Signed)
Daily Session Note  Patient Details  Name: ARACELI ARANGO MRN: [id number] Date of Birth: 11-24-1940 Referring Provider:   April Manson Pulmonary Rehab Walk Test from 10/20/2021 in [place]  Referring Provider Loanne Drilling       Encounter Date: 12/09/2021  Check In:  Session Check In - 12/09/21 1428       Check-In   Supervising physician immediately available to respond to emergencies [facility] immediately available    Physician(s) Dr. Karleen Hampshire    Location [facility]    Staff Present Rosebud Poles, RN, BSN;Laken Lobato Ysidro Evert, Cathleen Fears, MS, ACSM-CEP, Exercise Physiologist    Virtual Visit No    Medication changes reported     No    Fall or balance concerns reported    No    Tobacco Cessation No Change    Warm-up and Cool-down Performed as group-led instruction    Resistance Training Performed Yes    VAD Patient? No    PAD/SET Patient? No      Pain Assessment   Currently in Pain? No/denies    Multiple Pain Sites No             Capillary Blood Glucose: No results found for this or any previous visit (from the past 24 hour(s)).    Social History   Tobacco Use  Smoking Status Never  Smokeless Tobacco Never    Goals Met:  Exercise tolerated well No report of concerns or symptoms today Strength training completed today  Goals Unmet:  Not Applicable  Comments: Service time is from 1322 to [place]    Dr. Rodman Pickle is Medical Director for Pulmonary Rehab at [facility].

## 2021-12-09 NOTE — Telephone Encounter (Signed)
Pharmacy called to get refills on Cyclobenzaprine, Baclofen, Ketamine and Ketoprofen gel 100 g

## 2021-12-10 ENCOUNTER — Other Ambulatory Visit: Payer: Self-pay | Admitting: Physical Medicine and Rehabilitation

## 2021-12-10 NOTE — Progress Notes (Signed)
Pulmonary Individual Treatment Plan  Patient Details  Name: [name] MRN: [id number] Date of Birth: May 08, 1941 Referring Provider:   April Manson Pulmonary Rehab Walk Test from 10/20/2021 in [place]  Referring Provider Loanne Drilling       Initial Encounter Date:  Flowsheet Row Pulmonary Rehab Walk Test from 10/20/2021 in [place]  Date 10/20/21       Visit Diagnosis: Diffusion capacity of lung (dl), decreased  Shortness of breath  Patient's Home Medications on Admission:   Current Outpatient Medications:    acetaminophen (TYLENOL) 500 MG tablet, Take 500 mg by mouth 3 (three) times daily., Disp: , Rfl:    Acetylcysteine (NAC) 600 MG CAPS, Take 1 capsule (600 mg total) by mouth 2 (two) times daily., Disp: 60 capsule, Rfl: 0   amLODipine (NORVASC) 5 MG tablet, Take 5 mg by mouth daily., Disp: , Rfl:    benzocaine (ORAJEL) 10 % mucosal gel, Use as directed 1 application in the mouth or throat as needed for mouth pain., Disp: 5.3 g, Rfl: 0   calcium carbonate (OS-CAL) 600 MG TABS tablet, Take 600 mg by mouth daily with breakfast., Disp: , Rfl:    cetirizine (ZYRTEC) 10 MG tablet, Take 10 mg by mouth daily., Disp: , Rfl:    Cholecalciferol (VITAMIN D-3) 25 MCG (1000 UT) CAPS, Take 2,000 Units by mouth daily., Disp: , Rfl:    cloNIDine (CATAPRES - DOSED IN MG/24 HR) 0.1 mg/24hr patch, Place 1 patch (0.1 mg total) onto the skin once a week. (Patient not taking: Reported on 10/20/2021), Disp: 4 patch, Rfl: 12   Cyanocobalamin (VITAMIN B-12) 2500 MCG SUBL, Place 2,500 mcg under the tongue daily., Disp: , Rfl:    denosumab (PROLIA) 60 MG/ML SOSY injection, Inject 60 mg into the skin every 6 (six) months., Disp: , Rfl:    DULoxetine (CYMBALTA) 30 MG capsule, TAKE 1 CAPSULE TWICE A DAY (Patient taking differently: 60 mg daily.), Disp: 180 capsule, Rfl: 3   fentaNYL (DURAGESIC) 25 MCG/HR, Place 1 patch onto the skin every 3  (three) days., Disp: 10 patch, Rfl: 0   fluticasone (FLONASE) 50 MCG/ACT nasal spray, Place 1 spray into both nostrils daily., Disp: , Rfl:    gabapentin (NEURONTIN) 100 MG capsule, Take 300 mg by mouth 3 (three) times daily., Disp: , Rfl:    GENERIC EXTERNAL MEDICATION, 1 capsule daily. Pure Encapsulations Digestive Enzymes Ultra, Disp: , Rfl:    HYDROcodone-acetaminophen (NORCO) 5-325 MG tablet, Take 1 tablet by mouth every 6 (six) hours as needed for moderate pain., Disp: 120 tablet, Rfl: 0   levothyroxine (SYNTHROID) 75 MCG tablet, Take 75 mcg by mouth daily before breakfast., Disp: , Rfl:    Multiple Vitamins-Minerals (PRESERVISION AREDS 2 PO), Take 2 capsules by mouth daily., Disp: , Rfl:    naloxone (NARCAN) 0.4 MG/ML injection, Use prn for opioid overdose, Disp: 1 mL, Rfl: 3   NONFORMULARY OR COMPOUNDED ITEM, Apply 1 Pump topically 4 (four) times daily as needed. Ketamine 10%, Baclofen 2%, Cyclobenzaprine 2%, Ketoprofen 10%, Gabapentin 6%, Bupivacaine 1%, Amitryptiline 5% Dispense '100mg'$ , Disp: 100 each, Rfl: 3   olmesartan (BENICAR) 40 MG tablet, Take 40 mg by mouth daily. 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   Allergy    Arthritis    Cataract    Chronic headaches    Depression    Fibromyalgia    Gallstones    GERD (gastroesophageal reflux disease)    Hyperlipemia    Hypertension    Interstitial cystitis    Lactose intolerance     LBBB (left bundle branch block)    Osteoarthritis    Osteopenia    Pneumonia    Thyroid disease    Tinnitus    Trigeminal neuralgia     Tobacco Use: Social History   Tobacco Use  Smoking Status Never  Smokeless Tobacco Never    Labs: Review Flowsheet        No data to display          Capillary Blood Glucose: No results found for: "GLUCAP"   Pulmonary Assessment Scores:  Pulmonary Assessment Scores     Row Name 10/20/21 1137         ADL UCSD   ADL Phase Entry     SOB Score total 23       CAT Score   CAT Score 14       mMRC Score   mMRC Score 1             UCSD: Self-administered rating of dyspnea associated with activities of daily living (ADLs) 6-point scale (0 = "not at all" to 5 = "maximal or unable to do because of breathlessness")  Scoring Scores range from 0 to 120.  Minimally important difference is 5 units  CAT: CAT can identify the health impairment of COPD patients and is better correlated with disease progression.  CAT has a scoring range of zero to 40. The CAT score is classified into four groups of low (less than 10), medium (10 - 20), high (21-30) and very high (31-40) based on the impact level of disease on health status. A CAT score over 10 suggests significant symptoms.  A worsening CAT score could be explained by an exacerbation, poor medication adherence, poor inhaler technique, or progression of COPD or comorbid conditions.  CAT MCID is 2 points  mMRC: mMRC (Modified Medical Research Council) Dyspnea Scale is used to assess the degree of baseline functional disability in patients of respiratory disease due to dyspnea. No minimal important difference is established. A decrease in score of 1 point or greater is considered a positive change.   Pulmonary Function Assessment:  Pulmonary Function Assessment - 10/20/21 1236       Breath   Bilateral Breath Sounds Clear    Shortness of Breath Yes;Limiting activity              Exercise Target Goals: Exercise Program Goal: Individual exercise prescription set using results from initial 6 min walk test and THRR while considering  patient's activity barriers and safety.   Exercise Prescription Goal: Initial exercise prescription builds to 30-45 minutes a day of aerobic activity, 2-3 days per week.  Home exercise guidelines will be given to patient during program as part of exercise prescription that the participant will acknowledge.  Activity Barriers & Risk  Stratification:  Activity Barriers & Cardiac Risk Stratification - 10/20/21 1124       Activity Barriers & Cardiac Risk Stratification   Activity Barriers Fibromyalgia;Deconditioning;Muscular Weakness;Shortness of Breath;Balance Concerns;Arthritis             6 Minute Walk:  6 Minute Walk     Row Name 10/20/21 1240         6 Minute Walk   Phase Initial     Distance 1200 feet     Walk Time 6 minutes     # of Rest Breaks 0     MPH 2.27     METS 2.25     RPE 7     Perceived Dyspnea  1     VO2 Peak 7.86     Symptoms No     Resting HR 71 bpm     Resting BP 134/62     Resting Oxygen Saturation  98 %     Exercise Oxygen Saturation  during 6 min walk 94 %     Max Ex. HR 98 bpm     Max Ex. BP 162/70     2 Minute Post BP 150/70       Interval HR   1 Minute HR 84     2 Minute HR 90     3 Minute HR 94     4 Minute HR 98     5 Minute HR 97     6 Minute HR 96     2 Minute Post HR 71     Interval Heart Rate? Yes       Interval Oxygen   Interval Oxygen? Yes     Baseline Oxygen Saturation % 98 %     1 Minute Oxygen Saturation % 94 %     1 Minute Liters of Oxygen 0 L     2 Minute Oxygen Saturation % 98 %     2 Minute Liters of Oxygen 0 L     3 Minute Oxygen Saturation % 96 %     3 Minute Liters of Oxygen 0 L     4 Minute Oxygen Saturation % 98 %     4 Minute Liters of Oxygen 0 L     5 Minute Oxygen Saturation % 99 %     5 Minute Liters of Oxygen 0 L     6 Minute Oxygen Saturation %  98 %     6 Minute Liters of Oxygen 0 L     2 Minute Post Oxygen Saturation % 99 %     2 Minute Post Liters of Oxygen 0 L              Oxygen Initial Assessment:  Oxygen Initial Assessment - 11/04/21 0856       Initial 6 min Walk   Oxygen Used None             Oxygen Re-Evaluation:  Oxygen Re-Evaluation     Row Name 11/04/21 0856 12/02/21 0751           Program Oxygen Prescription   Program Oxygen Prescription None None        Home Oxygen   Home Oxygen Device None None      Sleep Oxygen Prescription None None      Home Exercise Oxygen Prescription None None      Home Resting Oxygen Prescription None None        Goals/Expected Outcomes   Short Term Goals  To learn and exhibit compliance with exercise, home and travel O2 prescription;To learn and understand importance of monitoring SPO2 with pulse oximeter and demonstrate accurate use of the pulse oximeter.;To learn and understand importance of maintaining oxygen saturations>88%;To learn and demonstrate proper pursed lip breathing techniques or other breathing techniques. ;To learn and demonstrate proper use of respiratory medications To learn and exhibit compliance with exercise, home and travel O2 prescription;To learn and understand importance of monitoring SPO2 with pulse oximeter and demonstrate accurate use of the pulse oximeter.;To learn and understand importance of maintaining oxygen saturations>88%;To learn and demonstrate proper pursed lip breathing techniques or other breathing techniques. ;To learn and demonstrate proper use of respiratory medications      Long  Term Goals Exhibits compliance with exercise, home  and travel O2 prescription;Verbalizes importance of monitoring SPO2 with pulse oximeter and return demonstration;Maintenance of O2 saturations>88%;Exhibits proper breathing techniques, such as pursed lip breathing or other method taught during program session;Compliance with respiratory  medication;Demonstrates proper use of MDI's Exhibits compliance with exercise, home  and travel O2 prescription;Verbalizes importance of monitoring SPO2 with pulse oximeter and return demonstration;Maintenance of O2 saturations>88%;Exhibits proper breathing techniques, such as pursed lip breathing or other method taught during program session;Compliance with respiratory medication;Demonstrates proper use of MDI's      Goals/Expected Outcomes Compliance and understanding of oxygen saturation monitoring and breathing techniques to decrease shortness of breath. Compliance and understanding of oxygen saturation monitoring and breathing techniques to decrease shortness of breath.               Oxygen Discharge (Final Oxygen Re-Evaluation):  Oxygen Re-Evaluation - 12/02/21 0751       Program Oxygen Prescription   Program Oxygen Prescription None      Home Oxygen   Home Oxygen Device None    Sleep Oxygen Prescription None    Home Exercise Oxygen Prescription None    Home Resting Oxygen Prescription None      Goals/Expected Outcomes   Short Term Goals To learn and exhibit compliance with exercise, home and travel O2 prescription;To learn and understand importance of monitoring SPO2 with pulse oximeter and demonstrate accurate use of the pulse oximeter.;To learn and understand importance of maintaining oxygen saturations>88%;To learn and demonstrate proper pursed lip breathing techniques or other breathing techniques. ;To learn and demonstrate proper use of respiratory medications    Long  Term Goals Exhibits compliance with exercise, home  and travel O2 prescription;Verbalizes importance of monitoring SPO2 with pulse oximeter and return demonstration;Maintenance of O2 saturations>88%;Exhibits proper breathing techniques, such as pursed lip breathing or other method taught during program session;Compliance with respiratory medication;Demonstrates proper use of MDI's    Goals/Expected Outcomes  Compliance and understanding of oxygen saturation monitoring and breathing techniques to decrease shortness of breath.             Initial Exercise Prescription:  Initial Exercise Prescription - 10/20/21 1200       Date of Initial Exercise RX and Referring Provider   Date 10/20/21    Referring Provider Loanne Drilling    Expected Discharge Date 12/25/21      NuStep   Level 1    SPM 60    Minutes 15      Arm Ergometer   Level 1    Watts 4    Minutes 15      Prescription Details   Frequency (times per week) 2    Duration Progress to 30 minutes of continuous aerobic without signs/symptoms of physical distress  Intensity   THRR 40-80% of Max Heartrate 55-110    Ratings of Perceived Exertion 11-13    Perceived Dyspnea 0-4      Progression   Progression Continue to progress workloads to maintain intensity without signs/symptoms of physical distress.      Resistance Training   Training Prescription Yes    Weight red bands    Reps 10-15             Perform Capillary Blood Glucose checks as needed.  Exercise Prescription Changes:   Exercise Prescription Changes     Row Name 11/04/21 1500 11/18/21 1500 11/27/21 1547         Response to Exercise   Blood Pressure (Admit) 110/70 118/60 122/74     Blood Pressure (Exercise) 150/80 142/70 --     Blood Pressure (Exit) 129/52 128/72 122/62     Heart Rate (Admit) 63 bpm 65 bpm 72 bpm     Heart Rate (Exercise) 96 bpm 79 bpm 93 bpm     Heart Rate (Exit) 72 bpm 68 bpm 75 bpm     Oxygen Saturation (Admit) 97 % 98 % 98 %     Oxygen Saturation (Exercise) 96 % 94 % 95 %     Oxygen Saturation (Exit) 98 % 97 % 96 %     Rating of Perceived Exertion (Exercise) '15 17 13     '$ Perceived Dyspnea (Exercise) 1 0 0     Duration Continue with 30 min of aerobic exercise without signs/symptoms of physical distress. Continue with 30 min of aerobic exercise without signs/symptoms of physical distress. Continue with 30 min of aerobic exercise  without signs/symptoms of physical distress.     Intensity THRR unchanged THRR unchanged THRR unchanged       Resistance Training   Training Prescription Yes Yes Yes     Weight red bands red bands red bands     Reps 10-15 10-15 10-15     Time -- 10 Minutes 10 Minutes       NuStep   Level '2 3 4     '$ SPM -- 60 70     Minutes '15 15 15     '$ METs 2.3 1.5 1.9       Arm Ergometer   Level 1.5 1 1.5     Watts -- 4 --     Minutes '15 15 15     '$ METs -- 1.9 2.2              Exercise Comments:   Exercise Comments     Row Name 10/28/21 1510           Exercise Comments Pt completed first day of exercise. Pat exercised for 15 min on the arm ergometer and Nustep. Pat exercised at level 1 on arm ergometer. She exercised at level 1 on the Nustep averaging 1.4 METs. Pat performed the warmup and cooldown standing without limitations.                Exercise Goals and Review:   Exercise Goals     Row Name 10/20/21 1125 11/04/21 0852 12/02/21 0751         Exercise Goals   Increase Physical Activity Yes Yes Yes     Intervention Provide advice, education, support and counseling about physical activity/exercise needs.;Develop an individualized exercise prescription for aerobic and resistive training based on initial evaluation findings, risk stratification, comorbidities and participant's personal goals. Provide advice, education, support and counseling about physical activity/exercise  needs.;Develop an individualized exercise prescription for aerobic and resistive training based on initial evaluation findings, risk stratification, comorbidities and participant's personal goals. Provide advice, education, support and counseling about physical activity/exercise needs.;Develop an individualized exercise prescription for aerobic and resistive training based on initial evaluation findings, risk stratification, comorbidities and participant's personal goals.     Expected Outcomes Short Term:  Attend rehab on a regular basis to increase amount of physical activity.;Long Term: Add in home exercise to make exercise part of routine and to increase amount of physical activity.;Long Term: Exercising regularly at least 3-5 days a week. Short Term: Attend rehab on a regular basis to increase amount of physical activity.;Long Term: Add in home exercise to make exercise part of routine and to increase amount of physical activity.;Long Term: Exercising regularly at least 3-5 days a week. Short Term: Attend rehab on a regular basis to increase amount of physical activity.;Long Term: Add in home exercise to make exercise part of routine and to increase amount of physical activity.;Long Term: Exercising regularly at least 3-5 days a week.     Increase Strength and Stamina Yes Yes Yes     Intervention Provide advice, education, support and counseling about physical activity/exercise needs.;Develop an individualized exercise prescription for aerobic and resistive training based on initial evaluation findings, risk stratification, comorbidities and participant's personal goals. Provide advice, education, support and counseling about physical activity/exercise needs.;Develop an individualized exercise prescription for aerobic and resistive training based on initial evaluation findings, risk stratification, comorbidities and participant's personal goals. Provide advice, education, support and counseling about physical activity/exercise needs.;Develop an individualized exercise prescription for aerobic and resistive training based on initial evaluation findings, risk stratification, comorbidities and participant's personal goals.     Expected Outcomes Short Term: Increase workloads from initial exercise prescription for resistance, speed, and METs.;Short Term: Perform resistance training exercises routinely during rehab and add in resistance training at home;Long Term: Improve cardiorespiratory fitness, muscular  endurance and strength as measured by increased METs and functional capacity (6MWT) Short Term: Increase workloads from initial exercise prescription for resistance, speed, and METs.;Short Term: Perform resistance training exercises routinely during rehab and add in resistance training at home;Long Term: Improve cardiorespiratory fitness, muscular endurance and strength as measured by increased METs and functional capacity (6MWT) Short Term: Increase workloads from initial exercise prescription for resistance, speed, and METs.;Short Term: Perform resistance training exercises routinely during rehab and add in resistance training at home;Long Term: Improve cardiorespiratory fitness, muscular endurance and strength as measured by increased METs and functional capacity (6MWT)     Able to understand and use rate of perceived exertion (RPE) scale Yes Yes Yes     Intervention Provide education and explanation on how to use RPE scale Provide education and explanation on how to use RPE scale Provide education and explanation on how to use RPE scale     Expected Outcomes Short Term: Able to use RPE daily in rehab to express subjective intensity level;Long Term:  Able to use RPE to guide intensity level when exercising independently Short Term: Able to use RPE daily in rehab to express subjective intensity level;Long Term:  Able to use RPE to guide intensity level when exercising independently Short Term: Able to use RPE daily in rehab to express subjective intensity level;Long Term:  Able to use RPE to guide intensity level when exercising independently     Able to understand and use Dyspnea scale Yes Yes Yes     Intervention Provide education and explanation on how to  use Dyspnea scale Provide education and explanation on how to use Dyspnea scale Provide education and explanation on how to use Dyspnea scale     Expected Outcomes Short Term: Able to use Dyspnea scale daily in rehab to express subjective sense of  shortness of breath during exertion;Long Term: Able to use Dyspnea scale to guide intensity level when exercising independently Short Term: Able to use Dyspnea scale daily in rehab to express subjective sense of shortness of breath during exertion;Long Term: Able to use Dyspnea scale to guide intensity level when exercising independently Short Term: Able to use Dyspnea scale daily in rehab to express subjective sense of shortness of breath during exertion;Long Term: Able to use Dyspnea scale to guide intensity level when exercising independently     Knowledge and understanding of Target Heart Rate Range (THRR) Yes Yes Yes     Intervention Provide education and explanation of THRR including how the numbers were predicted and where they are located for reference Provide education and explanation of THRR including how the numbers were predicted and where they are located for reference Provide education and explanation of THRR including how the numbers were predicted and where they are located for reference     Expected Outcomes Short Term: Able to state/look up THRR;Long Term: Able to use THRR to govern intensity when exercising independently;Short Term: Able to use daily as guideline for intensity in rehab Short Term: Able to state/look up THRR;Long Term: Able to use THRR to govern intensity when exercising independently;Short Term: Able to use daily as guideline for intensity in rehab Short Term: Able to state/look up THRR;Long Term: Able to use THRR to govern intensity when exercising independently;Short Term: Able to use daily as guideline for intensity in rehab     Understanding of Exercise Prescription Yes Yes Yes     Intervention Provide education, explanation, and written materials on patient's individual exercise prescription Provide education, explanation, and written materials on patient's individual exercise prescription Provide education, explanation, and written materials on patient's individual  exercise prescription     Expected Outcomes Short Term: Able to explain program exercise prescription;Long Term: Able to explain home exercise prescription to exercise independently Short Term: Able to explain program exercise prescription;Long Term: Able to explain home exercise prescription to exercise independently Short Term: Able to explain program exercise prescription;Long Term: Able to explain home exercise prescription to exercise independently              Exercise Goals Re-Evaluation :  Exercise Goals Re-Evaluation     Row Name 11/04/21 0852 12/02/21 0751           Exercise Goal Re-Evaluation   Exercise Goals Review Increase Physical Activity;Increase Strength and Stamina;Able to understand and use rate of perceived exertion (RPE) scale;Able to understand and use Dyspnea scale;Knowledge and understanding of Target Heart Rate Range (THRR);Understanding of Exercise Prescription Increase Physical Activity;Increase Strength and Stamina;Able to understand and use rate of perceived exertion (RPE) scale;Able to understand and use Dyspnea scale;Knowledge and understanding of Target Heart Rate Range (THRR);Understanding of Exercise Prescription      Comments [name] has completed 2 exericse sessions. She exercises for 15 min on the arm ergometer and Nustep. She averages 1.9 METs at level 1 on the arm ergometer and 1.5 METs at level 1 on the Nustep. [name] performs the warmup and cooldown standing without limitations. It is too soon to note any discernable progressions. Will continue to monitor and progress as able. [name] has completed 10 exericse sessions.  She exercises for 15 min on the arm ergometer and Nustep. She averages 2.0 METs at level 2 on the arm ergometer and 1.9 METs at level 4 on the Nustep. [name] performs the warmup and cooldown standing without limitations. [name] has increased her workload for both exercise modes. She is slow to progress. I am unsure how motivated [name] is to progress. She is  also limited by her chronic pain. Will continue to monitor and progress as able.      Expected Outcomes Through exercise at rehab and home, the patient will decrease shortness of breath with daily activities and feel confident in carrying out an exercise regimen at home. Through exercise at rehab and home, the patient will decrease shortness of breath with daily activities and feel confident in carrying out an exercise regimen at home.               Discharge Exercise Prescription (Final Exercise Prescription Changes):  Exercise Prescription Changes - 11/27/21 1547       Response to Exercise   Blood Pressure (Admit) 122/74    Blood Pressure (Exit) 122/62    Heart Rate (Admit) 72 bpm    Heart Rate (Exercise) 93 bpm    Heart Rate (Exit) 75 bpm    Oxygen Saturation (Admit) 98 %    Oxygen Saturation (Exercise) 95 %    Oxygen Saturation (Exit) 96 %    Rating of Perceived Exertion (Exercise) 13    Perceived Dyspnea (Exercise) 0    Duration Continue with 30 min of aerobic exercise without signs/symptoms of physical distress.    Intensity THRR unchanged      Resistance Training   Training Prescription Yes    Weight red bands    Reps 10-15    Time 10 Minutes      NuStep   Level 4    SPM 70    Minutes 15    METs 1.9      Arm Ergometer   Level 1.5    Minutes 15    METs 2.2             Nutrition:  Target Goals: Understanding of nutrition guidelines, daily intake of sodium '1500mg'$ , cholesterol '200mg'$ , calories 30% from fat and 7% or less from saturated fats, daily to have 5 or more servings of fruits and vegetables.  Biometrics:  Pre Biometrics - 10/20/21 1050       Pre Biometrics   Grip Strength 11 kg              Nutrition Therapy Plan and Nutrition Goals:  Nutrition Therapy & Goals - 12/09/21 1440       Nutrition Therapy   Diet General Healthy Diet      Personal Nutrition Goals   Nutrition Goal Patient to build a healthy plate to include lean  protein/plant protein, fruits, vegetables, whole grains, low fat dairy as part of a general healthful meal plan.   in action   Comments Patient continues to follow a high quality diet with regular intake of high fiber foods and lean protein. She does report that prednisone impacts her eating habits.      Intervention Plan   Intervention Prescribe, educate and counsel regarding individualized specific dietary modifications aiming towards targeted core components such as weight, hypertension, lipid management, diabetes, heart failure and other comorbidities.    Expected Outcomes Short Term Goal: Understand basic principles of dietary content, such as calories, fat, sodium, cholesterol and nutrients.;Long Term Goal: Adherence to prescribed  nutrition plan.             Nutrition Assessments:  Nutrition Assessments - 10/29/21 0901       Rate Your Plate Scores   Pre Score 69            MEDIFICTS Score Key: ?70 Need to make dietary changes  40-70 Heart Healthy Diet ? 40 Therapeutic Level Cholesterol Diet  Flowsheet Row PULMONARY REHAB OTHER RESPIRATORY from 10/28/2021 in [place]  Picture Your Plate Total Score on Admission 69      Picture Your Plate Scores: <97 Unhealthy dietary pattern with much room for improvement. 41-50 Dietary pattern unlikely to meet recommendations for good health and room for improvement. 51-60 More healthful dietary pattern, with some room for improvement.  >60 Healthy dietary pattern, although there may be some specific behaviors that could be improved.    Nutrition Goals Re-Evaluation:  Nutrition Goals Re-Evaluation     [place] Name 12/09/21 1440             Goals   Current Weight 151 lb 7.3 oz (68.7 kg)       Comment She does report that prednisone impacts her eating habits.       Expected Outcome Goals in progress. Patient continues to follow a high quality diet with regular intake of high fiber foods and lean  protein. She does report that prednisone impacts her eating habits.                Nutrition Goals Discharge (Final Nutrition Goals Re-Evaluation):  Nutrition Goals Re-Evaluation - 12/09/21 1440       Goals   Current Weight 151 lb 7.3 oz (68.7 kg)    Comment She does report that prednisone impacts her eating habits.    Expected Outcome Goals in progress. Patient continues to follow a high quality diet with regular intake of high fiber foods and lean protein. She does report that prednisone impacts her eating habits.             Psychosocial: Target Goals: Acknowledge presence or absence of significant depression and/or stress, maximize coping skills, provide positive support system. Participant is able to verbalize types and ability to use techniques and skills needed for reducing stress and depression.  Initial Review & Psychosocial Screening:  Initial Psych Review & Screening - 10/20/21 1120       Initial Review   Current issues with Current Depression   is on meds     Family Dynamics   Good Support System? Yes    Comments spouse and daughter      Barriers   Psychosocial barriers to participate in program There are no identifiable barriers or psychosocial needs.      Screening Interventions   Interventions Encouraged to exercise             Quality of Life Scores:  Scores of 19 and below usually indicate a poorer quality of life in these areas.  A difference of  2-3 points is a clinically meaningful difference.  A difference of 2-3 points in the total score of the Quality of Life Index has been associated with significant improvement in overall quality of life, self-image, physical symptoms, and general health in studies assessing change in quality of life.  PHQ-9: Review Flowsheet  More data exists      11/27/2021 10/20/2021 09/23/2021 07/31/2021 07/08/2021  Depression screen PHQ 2/9  Decreased Interest 0 1 0 0 0 0  Down, Depressed,  Hopeless 0 '1 3 1 '$ 0 0  PHQ - 2  Score 0 '2 3 1 '$ 0 0  Altered sleeping - 1 - - -  Tired, decreased energy - 2 - - -  Change in appetite - 0 - - -  Feeling bad or failure about yourself  - 0 - - -  Trouble concentrating - 0 - - -  Moving slowly or fidgety/restless - 0 - - -  Suicidal thoughts - 0 - - -  PHQ-9 Score - 6 - - -  Difficult doing work/chores - Somewhat difficult - - -   Interpretation of Total Score  Total Score Depression Severity:  1-4 = Minimal depression, 5-9 = Mild depression, 10-14 = Moderate depression, 15-19 = Moderately severe depression, 20-27 = Severe depression   Psychosocial Evaluation and Intervention:  Psychosocial Evaluation - 10/20/21 1121       Psychosocial Evaluation & Interventions   Interventions Encouraged to exercise with the program and follow exercise prescription    Expected Outcomes For Pat to participate in Pulmonary rehab    Continue Psychosocial Services  Follow up required by staff             Psychosocial Re-Evaluation:  Psychosocial Re-Evaluation     [place] Name 11/11/21 0850 12/02/21 0945           Psychosocial Re-Evaluation   Current issues with Current Depression Current Depression      Comments Depression is stable on current medication, no signs of depression, no barriers or psychosocial concerns identified at this time. Kirsi has no signs of depression and is stable on current medications and treatment.  She deals with her chronic pain very well and goes to a chronic pain clinic.      Expected Outcomes For Annai to continue to be free of psychosocial concerns or barriers while participating in pulmonary rehab. For Alaylah to continue to be free of barriers or psychosocial concerns while participating in pulmonary rehab.      Interventions Encouraged to attend Pulmonary Rehabilitation for the exercise --      Continue Psychosocial Services  No Follow up required No Follow up required               Psychosocial Discharge (Final Psychosocial  Re-Evaluation):  Psychosocial Re-Evaluation - 12/02/21 0945       Psychosocial Re-Evaluation   Current issues with Current Depression    Comments Nichoel has no signs of depression and is stable on current medications and treatment.  She deals with her chronic pain very well and goes to a chronic pain clinic.    Expected Outcomes For Catlin to continue to be free of barriers or psychosocial concerns while participating in pulmonary rehab.    Continue Psychosocial Services  No Follow up required             Education: Education Goals: Education classes will be provided on a weekly basis, covering required topics. Participant will state understanding/return demonstration of topics presented.  Learning Barriers/Preferences:   Education Topics: Risk Factor Reduction:  -Group instruction that is supported by a PowerPoint presentation. Instructor discusses the definition of a risk factor, different risk factors for pulmonary disease, and how the heart and lungs work together.     Nutrition for Pulmonary Patient:  -Group instruction provided by PowerPoint slides, verbal discussion, and written materials to support subject matter. The instructor gives an explanation and review of healthy diet recommendations, which includes a discussion on weight management, recommendations for fruit  and vegetable consumption, as well as protein, fluid, caffeine, fiber, sodium, sugar, and alcohol. Tips for eating when patients are short of breath are discussed. Flowsheet Row PULMONARY REHAB OTHER RESPIRATORY from 12/04/2021 in [place]  Date 12/04/21  Educator Sam  Instruction Review Code 1- Verbalizes Understanding       Pursed Lip Breathing:  -Group instruction that is supported by demonstration and informational handouts. Instructor discusses the benefits of pursed lip and diaphragmatic breathing and detailed demonstration on how to preform both.     Oxygen  Safety:  -Group instruction provided by PowerPoint, verbal discussion, and written material to support subject matter. There is an overview of "What is Oxygen" and "Why do we need it".  Instructor also reviews how to create a safe environment for oxygen use, the importance of using oxygen as prescribed, and the risks of noncompliance. There is a brief discussion on traveling with oxygen and resources the patient may utilize. Flowsheet Row PULMONARY REHAB OTHER RESPIRATORY from 12/04/2021 in [place]  Date 11/27/21  Educator Donnetta Simpers  [Handout]       Oxygen Equipment:  -Group instruction provided by [facility] Staff utilizing handouts, written materials, and equipment demonstrations. Flowsheet Row PULMONARY REHAB OTHER RESPIRATORY from 12/04/2021 in [place]  Date 11/20/21  Educator Donnetta Simpers  Instruction Review Code 1- Verbalizes Understanding       Signs and Symptoms:  -Group instruction provided by written material and verbal discussion to support subject matter. Warning signs and symptoms of infection, stroke, and heart attack are reviewed and when to call the physician/911 reinforced. Tips for preventing the spread of infection discussed.   Advanced Directives:  -Group instruction provided by verbal instruction and written material to support subject matter. Instructor reviews Advanced Directive laws and proper instruction for filling out document.   Pulmonary Video:  -Group video education that reviews the importance of medication and oxygen compliance, exercise, good nutrition, pulmonary hygiene, and pursed lip and diaphragmatic breathing for the pulmonary patient.   Exercise for the Pulmonary Patient:  -Group instruction that is supported by a PowerPoint presentation. Instructor discusses benefits of exercise, core components of exercise, frequency, duration, and intensity of an exercise routine, importance of  utilizing pulse oximetry during exercise, safety while exercising, and options of places to exercise outside of rehab.     Pulmonary Medications:  -Verbally interactive group education provided by instructor with focus on inhaled medications and proper administration.   Anatomy and Physiology of the Respiratory System and Intimacy:  -Group instruction provided by PowerPoint, verbal discussion, and written material to support subject matter. Instructor reviews respiratory cycle and anatomical components of the respiratory system and their functions. Instructor also reviews differences in obstructive and restrictive respiratory diseases with examples of each. Intimacy, Sex, and Sexuality differences are reviewed with a discussion on how relationships can change when diagnosed with pulmonary disease. Common sexual concerns are reviewed. Flowsheet Row PULMONARY REHAB OTHER RESPIRATORY from 12/04/2021 in [place]  Date 11/13/21  Educator Donnetta Simpers  Instruction Review Code 1- Rosezena Sensor Understanding       MD DAY -A group question and answer session with a medical doctor that allows participants to ask questions that relate to their pulmonary disease state.   OTHER EDUCATION -Group or individual verbal, written, or video instructions that support the educational goals of the pulmonary rehab program. [place] from 12/04/2021 in [place]  [place]  Date 11/06/21  Educator Thompson Grayer handout]  Instruction Review Code 1- Verbalizes Understanding       Holiday Eating Survival Tips:  -Group instruction provided by PowerPoint slides, verbal discussion, and written materials to support subject matter. The instructor gives patients tips, tricks, and techniques to help them not only survive but enjoy the holidays despite the onslaught of food that accompanies the holidays.   Knowledge Questionnaire  Score:  Knowledge Questionnaire Score - 10/20/21 1132       Knowledge Questionnaire Score   Pre Score 15/18             Core Components/Risk Factors/Patient Goals at Admission:  Personal Goals and Risk Factors at Admission - 10/20/21 1121       Core Components/Risk Factors/Patient Goals on Admission    Weight Management Weight Loss    Improve shortness of breath with ADL's Yes    Intervention Provide education, individualized exercise plan and daily activity instruction to help decrease symptoms of SOB with activities of daily living.    Expected Outcomes Short Term: Improve cardiorespiratory fitness to achieve a reduction of symptoms when performing ADLs;Long Term: Be able to perform more ADLs without symptoms or delay the onset of symptoms             Core Components/Risk Factors/Patient Goals Review:   Goals and Risk Factor Review     Row Name 11/11/21 0852 12/02/21 0948           Core Components/Risk Factors/Patient Goals Review   Personal Goals Review Develop more efficient breathing techniques such as purse lipped breathing and diaphragmatic breathing and practicing self-pacing with activity.;Increase knowledge of respiratory medications and ability to use respiratory devices properly.;Improve shortness of breath with ADL's Increase knowledge of respiratory medications and ability to use respiratory devices properly.;Improve shortness of breath with ADL's;Develop more efficient breathing techniques such as purse lipped breathing and diaphragmatic breathing and practicing self-pacing with activity.      Review Ivelise is 81 y/o and is progressing well with her exercise.  She has attended 4 exercise sessions and is @ 2.4 mets on the nustep 2 watts on the arm ergomenter. She seems to enjoy participating in pulmonary rehab. Reighan is doing well for an 81 y/o.  She is exercising @ 2.2 mets on the arm ergomenter and 1.9 mets on the nustep. She has attended 10 exercise  sessions.      Expected Outcomes See admission goals. See admission goals.               Core Components/Risk Factors/Patient Goals at Discharge (Final Review):   Goals and Risk Factor Review - 12/02/21 0948       Core Components/Risk Factors/Patient Goals Review   Personal Goals Review Increase knowledge of respiratory medications and ability to use respiratory devices properly.;Improve shortness of breath with ADL's;Develop more efficient breathing techniques such as purse lipped breathing and diaphragmatic breathing and practicing self-pacing with activity.    Review Jet is doing well for an 81 y/o.  She is exercising @ 2.2 mets on the arm ergomenter and 1.9 mets on the nustep. She has attended 10 exercise sessions.    Expected Outcomes See admission goals.             ITP Comments: Pt is making expected progress toward Pulmonary Rehab goals after completing 12 sessions. Recommend continued exercise, life style modification, education, and utilization of breathing techniques to increase stamina and strength, while also  decreasing shortness of breath with exertion.  Dr. Rodman Pickle is Medical Director for Pulmonary Rehab at [facility].

## 2021-12-10 NOTE — Telephone Encounter (Signed)
MyChart message sent to patient to see if refills are needed

## 2021-12-11 ENCOUNTER — Encounter (HOSPITAL_BASED_OUTPATIENT_CLINIC_OR_DEPARTMENT_OTHER): Payer: Medicare Other | Admitting: Physical Medicine and Rehabilitation

## 2021-12-11 ENCOUNTER — Encounter (HOSPITAL_COMMUNITY)
Admission: RE | Admit: 2021-12-11 | Discharge: 2021-12-11 | Disposition: A | Discharge status: Home / Self Care | Payer: Medicare Other | Source: Ambulatory Visit | Attending: Pulmonary Disease | Admitting: Pulmonary Disease

## 2021-12-11 DIAGNOSIS — M810 Age-related osteoporosis without current pathological fracture: Secondary | ICD-10-CM | POA: Diagnosis not present

## 2021-12-11 DIAGNOSIS — G500 Trigeminal neuralgia: Secondary | ICD-10-CM

## 2021-12-11 DIAGNOSIS — R942 Abnormal results of pulmonary function studies: Secondary | ICD-10-CM

## 2021-12-11 DIAGNOSIS — F32A Depression, unspecified: Secondary | ICD-10-CM | POA: Diagnosis not present

## 2021-12-11 DIAGNOSIS — Z79899 Other long term (current) drug therapy: Secondary | ICD-10-CM | POA: Diagnosis not present

## 2021-12-11 DIAGNOSIS — G9689 Other specified disorders of central nervous system: Secondary | ICD-10-CM | POA: Diagnosis not present

## 2021-12-11 DIAGNOSIS — R0602 Shortness of breath: Secondary | ICD-10-CM

## 2021-12-11 DIAGNOSIS — R400 Somnolence: Secondary | ICD-10-CM | POA: Diagnosis not present

## 2021-12-11 NOTE — Progress Notes (Signed)
Home Exercise Prescription I have reviewed a Home Exercise Prescription with [name]. [name] is not currently exercising at home. I encouraged her to start walking 2 non-rehab days/ wk for 2x15 or 30 min/day. I disussed walking strategies with her. She stated that she does have a walking trail near her home, but she does not how doable that is for her because her of dizziness. I mentioned walking laps around a grocery store. She agreed with my recommendations. [name] is motivated to exercise. She is very limited by her chronic pain and dizziness from her pain medicationsThe patient stated that their goals were to decrease pain. We reviewed exercise guidelines, target heart rate during exercise, RPE Scale, weather conditions, endpoints for exercise, warmup and cool down. The patient is encouraged to come to me with any questions. I will continue to follow up with the patient to assist them with progression and safety.    Sheppard Plumber, MS, ACSM-CEP 12/11/2021 3:40 PM

## 2021-12-11 NOTE — Progress Notes (Signed)
Daily Session Note  Patient Details  Name: [name] MRN: [id number] Date of Birth: 1940-10-09 Referring Provider:   April Manson Pulmonary Rehab Walk Test from 10/20/2021 in [place]  Referring Provider Loanne Drilling       Encounter Date: 12/11/2021  Check In:  Session Check In - 12/11/21 1347       Check-In   Supervising physician immediately available to respond to emergencies [facility] immediately available    Physician(s) Dr. Karleen Hampshire    Location [facility]    Staff Present Rosebud Poles, RN, BSN;Malayja Freund Ysidro Evert, Cathleen Fears, MS, ACSM-CEP, Exercise Physiologist    Virtual Visit No    Medication changes reported     No    Fall or balance concerns reported    No    Tobacco Cessation No Change    Warm-up and Cool-down Performed as group-led instruction    Resistance Training Performed Yes    VAD Patient? No    PAD/SET Patient? No      Pain Assessment   Multiple Pain Sites No             Capillary Blood Glucose: No results found for this or any previous visit (from the past 24 hour(s)).    Social History   Tobacco Use  Smoking Status Never  Smokeless Tobacco Never    Goals Met:  Proper associated with RPD/PD & O2 Sat Exercise tolerated well No report of concerns or symptoms today Strength training completed today  Goals Unmet:  Not Applicable  Comments: Service time is from 1323 to [place]    Dr. Rodman Pickle is Medical Director for Pulmonary Rehab at [facility].

## 2021-12-16 ENCOUNTER — Encounter (HOSPITAL_COMMUNITY)
Admission: RE | Admit: 2021-12-16 | Discharge: 2021-12-16 | Disposition: A | Discharge status: Home / Self Care | Payer: Medicare Other | Source: Ambulatory Visit | Attending: Pulmonary Disease | Admitting: Pulmonary Disease

## 2021-12-16 VITALS — Wt 151.5 lb

## 2021-12-16 DIAGNOSIS — Z79899 Other long term (current) drug therapy: Secondary | ICD-10-CM | POA: Diagnosis not present

## 2021-12-16 DIAGNOSIS — R400 Somnolence: Secondary | ICD-10-CM | POA: Diagnosis not present

## 2021-12-16 DIAGNOSIS — M810 Age-related osteoporosis without current pathological fracture: Secondary | ICD-10-CM | POA: Diagnosis not present

## 2021-12-16 DIAGNOSIS — G9689 Other specified disorders of central nervous system: Secondary | ICD-10-CM | POA: Diagnosis not present

## 2021-12-16 DIAGNOSIS — R0602 Shortness of breath: Secondary | ICD-10-CM

## 2021-12-16 DIAGNOSIS — G500 Trigeminal neuralgia: Secondary | ICD-10-CM | POA: Diagnosis not present

## 2021-12-16 DIAGNOSIS — R942 Abnormal results of pulmonary function studies: Secondary | ICD-10-CM

## 2021-12-16 DIAGNOSIS — F32A Depression, unspecified: Secondary | ICD-10-CM | POA: Diagnosis not present

## 2021-12-18 ENCOUNTER — Encounter (HOSPITAL_COMMUNITY)
Admission: RE | Admit: 2021-12-18 | Discharge: 2021-12-18 | Disposition: A | Discharge status: Home / Self Care | Payer: Medicare Other | Source: Ambulatory Visit | Attending: Pulmonary Disease | Admitting: Pulmonary Disease

## 2021-12-18 ENCOUNTER — Encounter: Payer: Self-pay | Admitting: Physical Medicine and Rehabilitation

## 2021-12-18 DIAGNOSIS — Z79899 Other long term (current) drug therapy: Secondary | ICD-10-CM | POA: Diagnosis not present

## 2021-12-18 DIAGNOSIS — R400 Somnolence: Secondary | ICD-10-CM | POA: Diagnosis not present

## 2021-12-18 DIAGNOSIS — R0602 Shortness of breath: Secondary | ICD-10-CM

## 2021-12-18 DIAGNOSIS — R942 Abnormal results of pulmonary function studies: Secondary | ICD-10-CM

## 2021-12-18 DIAGNOSIS — F32A Depression, unspecified: Secondary | ICD-10-CM | POA: Diagnosis not present

## 2021-12-18 DIAGNOSIS — G9689 Other specified disorders of central nervous system: Secondary | ICD-10-CM | POA: Diagnosis not present

## 2021-12-18 DIAGNOSIS — G500 Trigeminal neuralgia: Secondary | ICD-10-CM | POA: Diagnosis not present

## 2021-12-18 DIAGNOSIS — M810 Age-related osteoporosis without current pathological fracture: Secondary | ICD-10-CM | POA: Diagnosis not present

## 2021-12-18 NOTE — Progress Notes (Signed)
Daily Session Note  Patient Details  Name: DARCEL ZICK MRN: [id number] Date of Birth: 30-Mar-1941 Referring Provider:   April Manson Pulmonary Rehab Walk Test from 10/20/2021 in [place]  Referring Provider Loanne Drilling       Encounter Date: 12/18/2021  Check In:  Session Check In - 12/18/21 1435       Check-In   Supervising physician immediately available to respond to emergencies [facility] immediately available    Physician(s) Dr. Louanne Belton    Location [facility]    Staff Present Rosebud Poles, RN, BSN;Lisa Ysidro Evert, Cathleen Fears, MS, ACSM-CEP, Exercise Physiologist    Virtual Visit No    Medication changes reported     No    Fall or balance concerns reported    No    Tobacco Cessation No Change    Warm-up and Cool-down Performed as group-led instruction    Resistance Training Performed Yes    VAD Patient? No    PAD/SET Patient? No      Pain Assessment   Currently in Pain? No/denies    Multiple Pain Sites No             Capillary Blood Glucose: No results found for this or any previous visit (from the past 24 hour(s)).    Social History   Tobacco Use  Smoking Status Never  Smokeless Tobacco Never    Goals Met:  Proper associated with RPD/PD & O2 Sat Exercise tolerated well No report of concerns or symptoms today Strength training completed today  Goals Unmet:  Not Applicable  Comments: Service time is from 1320 to [place]    Dr. Rodman Pickle is Medical Director for Pulmonary Rehab at [facility].

## 2021-12-19 ENCOUNTER — Other Ambulatory Visit: Payer: Self-pay | Admitting: Physical Medicine and Rehabilitation

## 2021-12-19 MED ORDER — FENTANYL 12 MCG/HR TD PT72: 1.0000 | MEDICATED_PATCH | TRANSDERMAL | 0 refills | Status: DC

## 2021-12-24 ENCOUNTER — Encounter: Payer: Medicare Other | Attending: Physical Medicine and Rehabilitation | Admitting: Registered Nurse

## 2021-12-24 ENCOUNTER — Encounter: Payer: Self-pay | Admitting: Registered Nurse

## 2021-12-24 VITALS — BP 144/76 | HR 72 | Ht 63.0 in | Wt 148.0 lb

## 2021-12-24 DIAGNOSIS — M797 Fibromyalgia: Secondary | ICD-10-CM | POA: Insufficient documentation

## 2021-12-24 DIAGNOSIS — Z79891 Long term (current) use of opiate analgesic: Secondary | ICD-10-CM | POA: Insufficient documentation

## 2021-12-24 DIAGNOSIS — G500 Trigeminal neuralgia: Secondary | ICD-10-CM | POA: Diagnosis not present

## 2021-12-24 DIAGNOSIS — Z5181 Encounter for therapeutic drug level monitoring: Secondary | ICD-10-CM | POA: Insufficient documentation

## 2021-12-24 DIAGNOSIS — G894 Chronic pain syndrome: Secondary | ICD-10-CM | POA: Diagnosis not present

## 2021-12-24 MED ORDER — HYDROCODONE-ACETAMINOPHEN 5-325 MG PO TABS: 1.0000 | ORAL_TABLET | Freq: Four times a day (QID) | ORAL | 0 refills | Status: DC | PRN

## 2021-12-24 MED ORDER — TOPIRAMATE 25 MG PO TABS
25.0000 mg | ORAL_TABLET | Freq: Every day | ORAL | 2 refills | Status: DC
Start: 2021-12-24 — End: 2022-02-24

## 2021-12-24 NOTE — Progress Notes (Signed)
Subjective:    Patient ID: [name], female    DOB: [birth year], 81 y.o.   MRN: [id number]  HPI: [name] is a 81 y.o. female who returns for follow up appointment for chronic pain and medication refill. She states she has left facial pain with tingling that radiates into her forehead and ear at times. She rates her pain 10. Her current exercise regime is walking.  [name] asked if she could be prescribed Topamax, this provider sent a secure chat to Dr Ranell Patrick regarding the above, Dr Ranell Patrick states to prescribe Topamax 25 mg at HS. She is also on Gabapentin being prescribed by Dr Shelia Media, Dr Ranell Patrick is aware [name] is on gabapentin, the Topamax will be prescribe  per Dr Ranell Patrick orders. [name] was instructed to call Dr Shelia Media and speak with him regarding her new medication since he is prescribing the gabapentin. Also to speak with her pharmacist regarding time interval for the Gabapentin and Topamax, she also states she only takes her Gabapentin twice a day, due to daytime drowsiness. She verbalizes understanding.   She was instructed to send a My-Chart message with update on medication changes, Mr and Mrs Janco verbalizes understanding.    [name] Morphine equivalent is 77.09 MME.   Last Oral Swab was Performed on 09/23/2021, it was consistent.      Pain Inventory Average Pain 10 Pain Right Now 10 My pain is constant, sharp, burning, dull, stabbing, tingling, and aching  In the last 24 hours, has pain interfered with the following? General activity 8 Relation with others 4 Enjoyment of life 8 What TIME of day is your pain at its worst? morning , daytime, and evening Sleep (in general) Good  Pain is worse with: bending and some activites Pain improves with: rest, heat/ice, medication, and heat and cold towel Relief from Meds: 6  Family History  Problem Relation Age of Onset   Heart disease Mother    Heart disease Father    Stroke Father    Stroke  Sister    Asthma Daughter    Colon cancer Neg Hx    Social History   Socioeconomic History   Marital status: Married    Spouse name: Not on file   Number of children: 2   Years of education: college   Highest education level: Not on file  Occupational History   Occupation: housewife    Employer: RETIRED  Tobacco Use   Smoking status: Never   Smokeless tobacco: Never  Vaping Use   Vaping Use: Never used  Substance and Sexual Activity   Alcohol use: No    Alcohol/week: 0.0 standard drinks of alcohol   Drug use: No   Sexual activity: Not on file  Other Topics Concern   Not on file  Social History Narrative   1 cup of Jasmine tea daily    Social Determinants of Health   Financial Resource Strain: Not on file  Food Insecurity: Not on file  Transportation Needs: Not on file  Physical Activity: Not on file  Stress: Not on file  Social Connections: Not on file   Past Surgical History:  Procedure Laterality Date   CHOLECYSTECTOMY  03/2010   COLONOSCOPY  2009   Dr. Raford Pitcher    CYSTOSCOPY     Multiple Cystoscopies with stents or biopsy    ESOPHAGOGASTRODUODENOSCOPY  10/2013   EYE SURGERY     bilateral cataract removal   Gamma knife radiation  01/2018  for trigeminal neuralgia   REFRACTIVE SURGERY Bilateral 2022   RHIZOTOMY Left 04/28/2021   Procedure: Left Percutaneous trigeminal balloon rhizotomy;  Surgeon: Judith Part, MD;  Location: [facility];  Service: Neurosurgery;  Laterality: Left;   TONSILLECTOMY     Past Surgical History:  Procedure Laterality Date   CHOLECYSTECTOMY  03/2010   COLONOSCOPY  2009   Dr. Raford Pitcher    CYSTOSCOPY     Multiple Cystoscopies with stents or biopsy    ESOPHAGOGASTRODUODENOSCOPY  10/2013   EYE SURGERY     bilateral cataract removal   Gamma knife radiation  01/2018   for trigeminal neuralgia   REFRACTIVE SURGERY Bilateral 2022   RHIZOTOMY Left 04/28/2021   Procedure: Left Percutaneous trigeminal balloon rhizotomy;   Surgeon: Judith Part, MD;  Location: [facility];  Service: Neurosurgery;  Laterality: Left;   TONSILLECTOMY     Past Medical History:  Diagnosis Date   Adenomatous colon polyp 03/24/2018   Allergy    Arthritis    Cataract    Chronic headaches    Depression    Fibromyalgia    Gallstones    GERD (gastroesophageal reflux disease)    Hyperlipemia    Hypertension    Interstitial cystitis    Lactose intolerance    LBBB (left bundle branch block)    Osteoarthritis    Osteopenia    Pneumonia    Thyroid disease    Tinnitus    Trigeminal neuralgia    Ht '5\' 3"'$  (1.6 m)   Wt 148 lb (67.1 kg)   BMI 26.22 kg/m   Opioid Risk Score:   Fall Risk Score:  `1  Depression screen [facility] 2/9     11/27/2021   10:09 AM 10/20/2021    1:08 PM 10/20/2021   11:16 AM 09/23/2021    1:56 PM 07/31/2021   11:54 AM 07/08/2021    3:05 PM 06/02/2021   10:41 AM  Depression screen PHQ 2/9  Decreased Interest 0 1 0 0 0 0 1  Down, Depressed, Hopeless 0 '1 3 1 '$ 0 0 1  PHQ - 2 Score 0 '2 3 1 '$ 0 0 2  Altered sleeping   1      Tired, decreased energy   2      Change in appetite   0      Feeling bad or failure about yourself    0      Trouble concentrating   0      Moving slowly or fidgety/restless   0      Suicidal thoughts   0      PHQ-9 Score   6      Difficult doing work/chores   Somewhat difficult        Review of Systems  Eyes:  Positive for pain.  Neurological:  Positive for headaches.       Dizzy and off balance  All other systems reviewed and are negative.      Objective:   Physical Exam Vitals and nursing note reviewed.  Constitutional:      Appearance: Normal appearance.  Cardiovascular:     Rate and Rhythm: Normal rate and regular rhythm.     Pulses: Normal pulses.     Heart sounds: Normal heart sounds.  Pulmonary:     Effort: Pulmonary effort is normal.     Breath sounds: Normal breath sounds.  Musculoskeletal:     Cervical back: Normal range of motion and neck supple.     Comments:  Normal Muscle Bulk and Muscle Testing Reveals:  Upper Extremities: Full ROM and Muscle Strength 4/5 Lower Extremities: Full ROM and Muscle Strength 5/5 Arises from Table Slowly Narrow Based  Gait     Skin:    General: Skin is warm and dry.  Neurological:     Mental Status: She is alert and oriented to person, place, and time.  Psychiatric:        Mood and Affect: Mood normal.        Behavior: Behavior normal.         Assessment & Plan:  Right Lumbar Radiculitis: Continue Gabapentin. Continue HEP as Tolerated. Continue to Monitor. 12/24/2021 Trigeminal Neuralgia: RX: Topamax 25 mg at bedtime and  Continue Gabapentin Twice a day: Prescribed by Dr Shelia Media. Continue to Monitor. 12/24/2021 Fibromyalgia: Continue HEP as Tolerated. Continue Gabapentin. Continue to Monitor. 12/24/2021 Chronic Pain Syndrome: Sensory Deaffernation Syndrome: Continue : Fentanyl 25 MCG one  patch every three days #10 and Hydrocodone '5mg'$ /325 mg one tablet 4 times a day as needed for pain #120. We will continue the opioid monitoring program, this consists of regular clinic visits, examinations, urine drug screen, pill counts as well as use of [place] Controlled Substance Reporting system. A 12 month History has been reviewed on the [facility] on 07/05//2023   F/U in 1 month

## 2021-12-24 NOTE — Patient Instructions (Signed)
July 24th send a My- Chart Message with Update on Medication Change.   Send a My Chart Message on 08/ 13th or 08/14th : Regarding medication Change

## 2021-12-25 ENCOUNTER — Encounter (HOSPITAL_COMMUNITY)
Admission: RE | Admit: 2021-12-25 | Discharge: 2021-12-25 | Disposition: A | Discharge status: Home / Self Care | Payer: Medicare Other | Source: Ambulatory Visit | Attending: Pulmonary Disease | Admitting: Pulmonary Disease

## 2021-12-25 DIAGNOSIS — R942 Abnormal results of pulmonary function studies: Secondary | ICD-10-CM | POA: Diagnosis not present

## 2021-12-25 DIAGNOSIS — R0602 Shortness of breath: Secondary | ICD-10-CM | POA: Insufficient documentation

## 2021-12-25 DIAGNOSIS — R0600 Dyspnea, unspecified: Secondary | ICD-10-CM | POA: Insufficient documentation

## 2021-12-25 NOTE — Addendum Note (Signed)
Encounter addended by: Deon Pilling, RN on: 12/25/2021 3:41 PM  Actions taken: Clinical Note Signed, Episode resolved

## 2021-12-25 NOTE — Progress Notes (Signed)
Daily Session Note  Patient Details  Name: [name] MRN: [id number] Date of Birth: [birth year] Referring Provider:   April Manson Pulmonary Rehab Walk Test from 10/20/2021 in [place]  Referring Provider Loanne Drilling       Encounter Date: 12/25/2021  Check In:  Session Check In - 12/25/21 1508       Check-In   Supervising physician immediately available to respond to emergencies [facility] immediately available    Physician(s) Dr. Grandville Silos    Location [facility]    Staff Present Rosebud Poles, RN, BSN;Lisa Ysidro Evert, Cathleen Fears, MS, ACSM-CEP, Exercise Physiologist    Virtual Visit No    Medication changes reported     No    Fall or balance concerns reported    No    Tobacco Cessation No Change    Warm-up and Cool-down Performed as group-led instruction    Resistance Training Performed Yes    VAD Patient? No    PAD/SET Patient? No             Capillary Blood Glucose: No results found for this or any previous visit (from the past 24 hour(s)).    Social History   Tobacco Use  Smoking Status Never  Smokeless Tobacco Never    Goals Met:  Proper associated with RPD/PD & O2 Sat Independence with exercise equipment Exercise tolerated well No report of concerns or symptoms today Strength training completed today  Goals Unmet:  Not Applicable  Comments: Service time is from 1321 to 1450. Completed post 6 MWT.     Dr. Rodman Pickle is Medical Director for Pulmonary Rehab at [facility]

## 2021-12-25 NOTE — Progress Notes (Signed)
Discharge Progress Report  Patient Details  Name: [name] MRN: [id number] Date of Birth: 02/09/1941 Referring Provider:   [facility] Walk Test from 10/20/2021 in [place]  Referring Provider Loanne Drilling        Number of Visits: 16  Reason for Discharge:  Patient has met program and personal goals.  Smoking History:  Social History   Tobacco Use  Smoking Status Never  Smokeless Tobacco Never    Diagnosis:  Diffusion capacity of lung (dl), decreased  Shortness of breath  ADL [facility]:  Pulmonary Assessment Scores     Row Name 10/20/21 1137 12/17/21 0828 12/25/21 1511     ADL [facility]   ADL Phase Entry Exit --   SOB Score total 23 36 --     CAT Score   CAT Score 14 0 --     mMRC Score   mMRC Score 1 -- 1            Initial Exercise Prescription:  Initial Exercise Prescription - 10/20/21 1200       Date of Initial Exercise RX and Referring Provider   Date 10/20/21    Referring Provider Loanne Drilling    Expected Discharge Date 12/25/21      NuStep   Level 1    SPM 60    Minutes 15      Arm Ergometer   Level 1    Watts 4    Minutes 15      Prescription Details   Frequency (times per week) 2    Duration Progress to 30 minutes of continuous aerobic without signs/symptoms of physical distress      Intensity   THRR 40-80% of Max Heartrate 55-110    Ratings of Perceived Exertion 11-13    Perceived Dyspnea 0-4      Progression   Progression Continue to progress workloads to maintain intensity without signs/symptoms of physical distress.      Resistance Training   Training Prescription Yes    Weight red bands    Reps 10-15             Discharge Exercise Prescription (Final Exercise Prescription Changes):  Exercise Prescription Changes - 12/16/21 1500       Response to Exercise   Blood Pressure (Admit) 136/74    Blood Pressure (Exercise) 150/80    Blood Pressure (Exit) 136/72    Heart  Rate (Admit) 61 bpm    Heart Rate (Exercise) 87 bpm    Heart Rate (Exit) 65 bpm    Oxygen Saturation (Admit) 99 %    Oxygen Saturation (Exercise) 99 %    Oxygen Saturation (Exit) 96 %    Rating of Perceived Exertion (Exercise) 13    Perceived Dyspnea (Exercise) 0    Duration Continue with 30 min of aerobic exercise without signs/symptoms of physical distress.    Intensity THRR unchanged      Progression   Progression Continue to progress workloads to maintain intensity without signs/symptoms of physical distress.      Resistance Training   Training Prescription Yes    Weight red bands    Reps 10-15    Time 10 Minutes      NuStep   Level 4    SPM 80    Minutes 15    METs 1.9      Arm Ergometer   Level 1    Minutes 15    METs 1.9  Functional Capacity:  6 Minute Walk     Row Name 10/20/21 1240 12/25/21 1507       6 Minute Walk   Phase Initial Discharge    Distance 1200 feet 1450 feet    Distance % Change -- 20.83 %    Distance Feet Change -- 250 ft    Walk Time 6 minutes 6 minutes    # of Rest Breaks 0 0    MPH 2.27 2.75    METS 2.25 2.92    RPE 7 9    Perceived Dyspnea  1 1    VO2 Peak 7.86 10.23    Symptoms No No    Resting HR 71 bpm 67 bpm    Resting BP 134/62 134/70    Resting Oxygen Saturation  98 % 98 %    Exercise Oxygen Saturation  during 6 min walk 94 % 97 %    Max Ex. HR 98 bpm 112 bpm    Max Ex. BP 162/70 170/60    2 Minute Post BP 150/70 150/70      Interval HR   1 Minute HR 84 81    2 Minute HR 90 87    3 Minute HR 94 96    4 Minute HR 98 100    5 Minute HR 97 112    6 Minute HR 96 103    2 Minute Post HR 71 75    Interval Heart Rate? Yes Yes      Interval Oxygen   Interval Oxygen? Yes Yes    Baseline Oxygen Saturation % 98 % 98 %    1 Minute Oxygen Saturation % 94 % 98 %    1 Minute Liters of Oxygen 0 L 0 L    2 Minute Oxygen Saturation % 98 % 99 %    2 Minute Liters of Oxygen 0 L 0 L    3 Minute Oxygen Saturation  % 96 % 97 %    3 Minute Liters of Oxygen 0 L 0 L    4 Minute Oxygen Saturation % 98 % 97 %    4 Minute Liters of Oxygen 0 L 0 L    5 Minute Oxygen Saturation % 99 % 98 %    5 Minute Liters of Oxygen 0 L 0 L    6 Minute Oxygen Saturation % 98 % 97 %    6 Minute Liters of Oxygen 0 L 0 L    2 Minute Post Oxygen Saturation % 99 % 98 %    2 Minute Post Liters of Oxygen 0 L 0 L             Psychological, QOL, Others - Outcomes: PHQ 2/9:    12/25/2021    2:01 PM 12/24/2021    1:11 PM 11/27/2021   10:09 AM 10/20/2021    1:08 PM 10/20/2021   11:16 AM  Depression screen PHQ 2/9  Decreased Interest 0 1 0 1 0  Down, Depressed, Hopeless 3 1 0 1 3  PHQ - 2 Score 3 2 0 2 3  Altered sleeping 0    1  Tired, decreased energy 0    2  Change in appetite 0    0  Feeling bad or failure about yourself  0    0  Trouble concentrating 0    0  Moving slowly or fidgety/restless 0    0  Suicidal thoughts 0    0  PHQ-9 Score 3  6  Difficult doing work/chores Somewhat difficult    Somewhat difficult    Quality of Life:   Personal Goals: Goals established at orientation with interventions provided to work toward goal.  Personal Goals and Risk Factors at Admission - 10/20/21 1121       Core Components/Risk Factors/Patient Goals on Admission    Weight Management Weight Loss    Improve shortness of breath with ADL's Yes    Intervention Provide education, individualized exercise plan and daily activity instruction to help decrease symptoms of SOB with activities of daily living.    Expected Outcomes Short Term: Improve cardiorespiratory fitness to achieve a reduction of symptoms when performing ADLs;Long Term: Be able to perform more ADLs without symptoms or delay the onset of symptoms              Personal Goals Discharge:  Goals and Risk Factor Review     Row Name 11/11/21 0852 12/02/21 0948           Core Components/Risk Factors/Patient Goals Review   Personal Goals Review Develop more  efficient breathing techniques such as purse lipped breathing and diaphragmatic breathing and practicing self-pacing with activity.;Increase knowledge of respiratory medications and ability to use respiratory devices properly.;Improve shortness of breath with ADL's Increase knowledge of respiratory medications and ability to use respiratory devices properly.;Improve shortness of breath with ADL's;Develop more efficient breathing techniques such as purse lipped breathing and diaphragmatic breathing and practicing self-pacing with activity.      Review Kayin is 81 y/o and is progressing well with her exercise.  She has attended 4 exercise sessions and is @ 2.4 mets on the nustep 2 watts on the arm ergomenter. She seems to enjoy participating in pulmonary rehab. Lahoma is doing well for an 81 y/o.  She is exercising @ 2.2 mets on the arm ergomenter and 1.9 mets on the nustep. She has attended 10 exercise sessions.      Expected Outcomes See admission goals. See admission goals.               Exercise Goals and Review:  Exercise Goals     Row Name 10/20/21 1125 11/04/21 0852 12/02/21 0751         Exercise Goals   Increase Physical Activity Yes Yes Yes     Intervention Provide advice, education, support and counseling about physical activity/exercise needs.;Develop an individualized exercise prescription for aerobic and resistive training based on initial evaluation findings, risk stratification, comorbidities and participant's personal goals. Provide advice, education, support and counseling about physical activity/exercise needs.;Develop an individualized exercise prescription for aerobic and resistive training based on initial evaluation findings, risk stratification, comorbidities and participant's personal goals. Provide advice, education, support and counseling about physical activity/exercise needs.;Develop an individualized exercise prescription for aerobic and resistive training based on  initial evaluation findings, risk stratification, comorbidities and participant's personal goals.     Expected Outcomes Short Term: Attend rehab on a regular basis to increase amount of physical activity.;Long Term: Add in home exercise to make exercise part of routine and to increase amount of physical activity.;Long Term: Exercising regularly at least 3-5 days a week. Short Term: Attend rehab on a regular basis to increase amount of physical activity.;Long Term: Add in home exercise to make exercise part of routine and to increase amount of physical activity.;Long Term: Exercising regularly at least 3-5 days a week. Short Term: Attend rehab on a regular basis to increase amount of physical activity.;Long Term: Add in home exercise to  make exercise part of routine and to increase amount of physical activity.;Long Term: Exercising regularly at least 3-5 days a week.     Increase Strength and Stamina Yes Yes Yes     Intervention Provide advice, education, support and counseling about physical activity/exercise needs.;Develop an individualized exercise prescription for aerobic and resistive training based on initial evaluation findings, risk stratification, comorbidities and participant's personal goals. Provide advice, education, support and counseling about physical activity/exercise needs.;Develop an individualized exercise prescription for aerobic and resistive training based on initial evaluation findings, risk stratification, comorbidities and participant's personal goals. Provide advice, education, support and counseling about physical activity/exercise needs.;Develop an individualized exercise prescription for aerobic and resistive training based on initial evaluation findings, risk stratification, comorbidities and participant's personal goals.     Expected Outcomes Short Term: Increase workloads from initial exercise prescription for resistance, speed, and METs.;Short Term: Perform resistance training  exercises routinely during rehab and add in resistance training at home;Long Term: Improve cardiorespiratory fitness, muscular endurance and strength as measured by increased METs and functional capacity (6MWT) Short Term: Increase workloads from initial exercise prescription for resistance, speed, and METs.;Short Term: Perform resistance training exercises routinely during rehab and add in resistance training at home;Long Term: Improve cardiorespiratory fitness, muscular endurance and strength as measured by increased METs and functional capacity (6MWT) Short Term: Increase workloads from initial exercise prescription for resistance, speed, and METs.;Short Term: Perform resistance training exercises routinely during rehab and add in resistance training at home;Long Term: Improve cardiorespiratory fitness, muscular endurance and strength as measured by increased METs and functional capacity (6MWT)     Able to understand and use rate of perceived exertion (RPE) scale Yes Yes Yes     Intervention Provide education and explanation on how to use RPE scale Provide education and explanation on how to use RPE scale Provide education and explanation on how to use RPE scale     Expected Outcomes Short Term: Able to use RPE daily in rehab to express subjective intensity level;Long Term:  Able to use RPE to guide intensity level when exercising independently Short Term: Able to use RPE daily in rehab to express subjective intensity level;Long Term:  Able to use RPE to guide intensity level when exercising independently Short Term: Able to use RPE daily in rehab to express subjective intensity level;Long Term:  Able to use RPE to guide intensity level when exercising independently     Able to understand and use Dyspnea scale Yes Yes Yes     Intervention Provide education and explanation on how to use Dyspnea scale Provide education and explanation on how to use Dyspnea scale Provide education and explanation on how to use  Dyspnea scale     Expected Outcomes Short Term: Able to use Dyspnea scale daily in rehab to express subjective sense of shortness of breath during exertion;Long Term: Able to use Dyspnea scale to guide intensity level when exercising independently Short Term: Able to use Dyspnea scale daily in rehab to express subjective sense of shortness of breath during exertion;Long Term: Able to use Dyspnea scale to guide intensity level when exercising independently Short Term: Able to use Dyspnea scale daily in rehab to express subjective sense of shortness of breath during exertion;Long Term: Able to use Dyspnea scale to guide intensity level when exercising independently     Knowledge and understanding of Target Heart Rate Range (THRR) Yes Yes Yes     Intervention Provide education and explanation of THRR including how the numbers were predicted  and where they are located for reference Provide education and explanation of THRR including how the numbers were predicted and where they are located for reference Provide education and explanation of THRR including how the numbers were predicted and where they are located for reference     Expected Outcomes Short Term: Able to state/look up THRR;Long Term: Able to use THRR to govern intensity when exercising independently;Short Term: Able to use daily as guideline for intensity in rehab Short Term: Able to state/look up THRR;Long Term: Able to use THRR to govern intensity when exercising independently;Short Term: Able to use daily as guideline for intensity in rehab Short Term: Able to state/look up THRR;Long Term: Able to use THRR to govern intensity when exercising independently;Short Term: Able to use daily as guideline for intensity in rehab     Understanding of Exercise Prescription Yes Yes Yes     Intervention Provide education, explanation, and written materials on patient's individual exercise prescription Provide education, explanation, and written materials on  patient's individual exercise prescription Provide education, explanation, and written materials on patient's individual exercise prescription     Expected Outcomes Short Term: Able to explain program exercise prescription;Long Term: Able to explain home exercise prescription to exercise independently Short Term: Able to explain program exercise prescription;Long Term: Able to explain home exercise prescription to exercise independently Short Term: Able to explain program exercise prescription;Long Term: Able to explain home exercise prescription to exercise independently              Exercise Goals Re-Evaluation:  Exercise Goals Re-Evaluation     Row Name 11/04/21 0852 12/02/21 0751           Exercise Goal Re-Evaluation   Exercise Goals Review Increase Physical Activity;Increase Strength and Stamina;Able to understand and use rate of perceived exertion (RPE) scale;Able to understand and use Dyspnea scale;Knowledge and understanding of Target Heart Rate Range (THRR);Understanding of Exercise Prescription Increase Physical Activity;Increase Strength and Stamina;Able to understand and use rate of perceived exertion (RPE) scale;Able to understand and use Dyspnea scale;Knowledge and understanding of Target Heart Rate Range (THRR);Understanding of Exercise Prescription      Comments [name] has completed 2 exericse sessions. She exercises for 15 min on the arm ergometer and Nustep. She averages 1.9 METs at level 1 on the arm ergometer and 1.5 METs at level 1 on the Nustep. [name] performs the warmup and cooldown standing without limitations. It is too soon to note any discernable progressions. Will continue to monitor and progress as able. [name] has completed 10 exericse sessions. She exercises for 15 min on the arm ergometer and Nustep. She averages 2.0 METs at level 2 on the arm ergometer and 1.9 METs at level 4 on the Nustep. [name] performs the warmup and cooldown standing without limitations. [name] has  increased her workload for both exercise modes. She is slow to progress. I am unsure how motivated [name] is to progress. She is also limited by her chronic pain. Will continue to monitor and progress as able.      Expected Outcomes Through exercise at rehab and home, the patient will decrease shortness of breath with daily activities and feel confident in carrying out an exercise regimen at home. Through exercise at rehab and home, the patient will decrease shortness of breath with daily activities and feel confident in carrying out an exercise regimen at home.               Nutrition & Weight - Outcomes:  Pre Biometrics -  10/20/21 1050       Pre Biometrics   Grip Strength 11 kg             Post Biometrics - 12/25/21 1510        Post  Biometrics   Grip Strength 17 kg             Nutrition:  Nutrition Therapy & Goals - 12/25/21 1505       Nutrition Therapy   Diet General Healthy Diet      Personal Nutrition Goals   Nutrition Goal Patient to build a healthy plate to include lean protein/plant protein, fruits, vegetables, whole grains, low fat dairy as part of a general healthful meal plan.   in action   Comments Patient graduates today. Patient continues to follow a high quality diet with regular intake of high fiber foods and lean protein. She does report that prednisone impacts her eating habits.      Intervention Plan   Intervention Prescribe, educate and counsel regarding individualized specific dietary modifications aiming towards targeted core components such as weight, hypertension, lipid management, diabetes, heart failure and other comorbidities.    Expected Outcomes Short Term Goal: Understand basic principles of dietary content, such as calories, fat, sodium, cholesterol and nutrients.;Long Term Goal: Adherence to prescribed nutrition plan.             Nutrition Discharge:  Nutrition Assessments - 12/16/21 1500       Rate Your Plate Scores   Post  Score 69             Education Questionnaire Score:  Knowledge Questionnaire Score - 12/17/21 0826       Knowledge Questionnaire Score   Pre Score --    Post Score 16/18             Goals reviewed with patient; copy given to patient.

## 2021-12-26 DIAGNOSIS — G509 Disorder of trigeminal nerve, unspecified: Secondary | ICD-10-CM | POA: Diagnosis not present

## 2021-12-26 DIAGNOSIS — M792 Neuralgia and neuritis, unspecified: Secondary | ICD-10-CM | POA: Diagnosis not present

## 2021-12-29 ENCOUNTER — Ambulatory Visit (INDEPENDENT_AMBULATORY_CARE_PROVIDER_SITE_OTHER): Payer: Medicare Other | Admitting: Orthopedic Surgery

## 2021-12-29 DIAGNOSIS — M170 Bilateral primary osteoarthritis of knee: Secondary | ICD-10-CM

## 2021-12-30 ENCOUNTER — Encounter: Payer: Self-pay | Admitting: Orthopedic Surgery

## 2021-12-30 MED ORDER — LIDOCAINE HCL 1 % IJ SOLN
5.0000 mL | INTRAMUSCULAR | Status: AC | PRN
  Administered 2021-12-29: 5 mL

## 2021-12-30 MED ORDER — HYLAN G-F 20 48 MG/6ML IX SOSY
48.0000 mg | PREFILLED_SYRINGE | INTRA_ARTICULAR | Status: AC | PRN
  Administered 2021-12-29: 48 mg via INTRA_ARTICULAR

## 2021-12-30 NOTE — Progress Notes (Signed)
   Procedure Note  Patient: [name]             Date of Birth: Sep 12, 1940           MRN: [id number]             Visit Date: 12/29/2021  Procedures: Visit Diagnoses:  1. Primary osteoarthritis of both knees     Large Joint Inj: R knee on 12/29/2021 9:32 AM Indications: diagnostic evaluation, joint swelling and pain Details: 18 G 1.5 in needle, superolateral approach  Arthrogram: No  Medications: 5 mL lidocaine 1 %; 48 mg Hylan 48 MG/6ML Outcome: tolerated well, no immediate complications Procedure, treatment alternatives, risks and benefits explained, specific risks discussed. Consent was given by the patient. Immediately prior to procedure a time out was called to verify the correct patient, procedure, equipment, support staff and site/side marked as required. Patient was prepped and draped in the usual sterile fashion.    Large Joint Inj: L knee on 12/29/2021 9:33 AM Indications: diagnostic evaluation, joint swelling and pain Details: 18 G 1.5 in needle, superolateral approach  Arthrogram: No  Medications: 5 mL lidocaine 1 %; 48 mg Hylan 48 MG/6ML Outcome: tolerated well, no immediate complications Procedure, treatment alternatives, risks and benefits explained, specific risks discussed. Consent was given by the patient. Immediately prior to procedure a time out was called to verify the correct patient, procedure, equipment, support staff and site/side marked as required. Patient was prepped and draped in the usual sterile fashion.    This patient is diagnosed with osteoarthritis of the knee(s).    Radiographs show evidence of joint space narrowing, osteophytes, subchondral sclerosis and/or subchondral cysts.  This patient has knee pain which interferes with functional and activities of daily living.    This patient has experienced inadequate response, adverse effects and/or intolerance with conservative treatments such as acetaminophen, NSAIDS, topical creams, physical  therapy or regular exercise, knee bracing and/or weight loss.   This patient has experienced inadequate response or has a contraindication to intra articular steroid injections for at least 3 months.   This patient is not scheduled to have a total knee replacement within 6 months of starting treatment with viscosupplementation.

## 2022-01-01 NOTE — Progress Notes (Signed)
Noted.  Will submit after 06/24/2022.  Next available gel injection would need to be after 07/01/2022

## 2022-01-02 ENCOUNTER — Encounter (HOSPITAL_BASED_OUTPATIENT_CLINIC_OR_DEPARTMENT_OTHER): Payer: Medicare Other | Admitting: Physical Medicine and Rehabilitation

## 2022-01-02 DIAGNOSIS — G500 Trigeminal neuralgia: Secondary | ICD-10-CM | POA: Diagnosis not present

## 2022-01-02 NOTE — Progress Notes (Unsigned)
Subjective:    Patient ID: [name], female    DOB: 07-13-1940, 81 y.o.   MRN: [id number]  HPI  [name] presents today for f/u of severe trigeminal neuralgia.   1) Severe trigeminal neuralgia: -she is still not doing well. -pain was so severe yesterday that she expressed suicidality to her husband [name].  -she feels that none of her medications are helpful but she knows that when she tries to wean them her pain worsens.  -she asks about refill of the compounding cream -her husband asks about referral to ketamine clinic for depression rather than pain -she went back to the dentist because it was urting between two teeth and he said there is nothing wrong with those teeth and it is the trigeminal nerve When she took a shower that usually seems to help with but was migrating around her face.  -appointment with neurology in July -Dr. Lebron Quam cancelled the zoom call in April but this was rescheduled to July.  -she finds that only the gabapentin helps -she is not sure if the fentanyl helps When she is feeling really bad she tries to take another one but she gets very sleepy and dizzy.  -she is taking 8 a day of '100mg'$  per day- she takes two gabapentin and tylenol and hydrocodone in the morning. She takes another two at breakfast around 11, another 2 at lunch around 1:30pm.  -she had a recent root canal and pain has become much more severe after that -her pain is in the two teeth next to where she had the root canal -Oragel heps for a bout an hour.  -she tried increasing her Gabapentin to '300mg'$  TID but she could not tolerate this. It made her too sleepy.  -she contacted Dr. Davy Pique and he recommended she pause wean of Fentanyl patch and I agree with this -she took last patch last night. She still has 12.54mg patch available -her husband asks whether she should take a higher dose of gabapentin -the fentanyl patch makes her very sleepy and so allows her to sleep. It has also made her  dizzier.  -the 566m fentanyl patch did not provide more relief- she would like to wean off. Weaned to 25, then 12.17m23m then off this week -[facility] not to Dr. WolJuel Burrowe states and they need a new referral send to Dr. LieVan Clineshe is using the compound cream and this helps- she uses this twice per day -capsaicin made it burn  -lamotrigine makes her too sleepy and dizzy to come down the stairs by herself and she does not feel comfortable going to the shower -left eye more open than usual. -she does not feel she is getting relief from the fentanyl patch -she feels that the procedure at [facility] is a last resort.  -Given darkening of skin and worsening eye lid closure, MRI brain stat obtained and shows new cyst that could be post-surgical. Recommended she go to ED given worsening dizziness and facial swelling as well. ED physician discussed case with NSGY and neurology and discharged home recommending follow-up with Dr. EicGean Quintd Dr. OstMerceda Elkshe was given IV Fentanyl in the ED which helped and discharged on 28m917mentanyl patch, which has not helped.  -discussed increasing Fentanyl patch to 217mc59mnce she has not experienced any negative side effects, no respiratory depression, and her husband is agreeable. I have sent in new script to start Saturday, but insurance required prior auth, which was approved.  -  tonight would be the third tay of the 51mg Fentanyl patch -she is still taking the Gabapentin -she feels that when she lays down she is spinning.  -eye continues to appear more closed.  -Husband has called Dr. EDionne Anoand Dr. OEarlie Ravelingoffice to scheduled follow-up appointments and has appointments with both tomorrow.  -Will run out of Fentanyl patch by Sunday -pain is worsening and nothing is providing relief -she asks whether she should go to ED -she has also noted dizziness and is not sure if this is from being in the MRI or from the  [place]  -morphine was not effective and caused itching, sarna lotion did help wit this.  -hydrocodone has been most effective thus far -nucynta was not effective but she is not sure whether she gave it a fair trial -she has month appointments scheduled with EZella Balland f/u with me in Feb -she used the Norco last night and this morning with good benefit and has started to feel the pain return this afternoon -she did find benefit from the compound cream- she continues to use this.  -She continues to have a sharp shooting pain doing around her eyelid and the tip teeth and in the top of her head.  -has a radiofrequency ablation treatment that helped but not completely -she is not having as much sharp knife-like pin like in her eye Her top teeth in her left law have been hurting really badly.  -she now takes the baclofen with the gabapentin and they seem to work well together.  -she had surgical procedure and feels worse -yesterday she took a half a tablet but this morning she took a whole one. She does fine with the hydrocodone but a whole one makes her too sleepy.  -she is taking 2 '100mg'$  Gabapentin three times per day but it makes her dizzy and sleepy so she cannot funciton -the cold worsens her pain.  -The IV fentanyl did help with the pain.  -her husband feels she should get one more dose of the '25mg'$  fentanyl. -she did get one week benefit from the steroid injection.  -she is doing terribly -she is miserable -she feels depressed due to the pain -she has considered taking the whole bottle of hydrocodone because because she can't take this pain anymore.  -she started taking the Gabapentin '300mg'$  TID.  -she does find relief from the hydrocodone -the pain is constant -she wonders if it is inflammation.  -she has not tried turmeric. Her son-in-law gave her a bottle and she couldn't stomach it.   2) Fibromyalgia: -She has been almost nonfunctional due to the severity of her pain. It continues  to be very severe.  -She would like to wean off the Gabapentin as she feels at risk of falls -she is taking Topamax BID. She felt this was helpful but made her unsteady on her feet.  -still using gabapentin and this is helping.   3) Osteoporosis: -husband asks whether she should get a Prolia shot.   4) Dizziness: -head was swimming -she felt like she was going to fall.  -worsening -feels like she is spinning, not the room  -She tried the Phenytoin for 2 days and did not feel benefit so stopped it.   -she feels the gabapentin aggravates this and she would love to wean off but knows it helps her.   -She feels very sleepy during the day.   -She has never tried Topirmate before.   5) Sore throat -developed stopping lamotrigine since not  helping.   Prior history:  Her pain continues to be in her left eye, forehead. She has not tried any topicals for pain. She uses capsaicin for her back and knees.   Since last visit nothing we have tried has helped with her pain. She has tried to decrease her Gabapentin dose but then her pain increased so she has resumed taking 4 Gabapentin per day.   Her husband asks whether he should contact her surgeon regarding whether nerve may have become unclamped.   Discussed prolotherapy, trigger point injections, Baclofen, Clonazepam, Phenytoin as other options we could try,=.  Pain continues to be excruciating and she states she is not sure how much longer she can live like this.   Prior history: She has having some nausea, lightheadedness and felt this may be related to her Gabapentin. She did not take any yesterday and then felt more sever pain in the evening. She also gets electrical shooting across her jaw.   She does not feel that she has cluster headaches as the pain has been so constant. She feels that she is getting worse. The oxygen made her feel relaxed but it is not helping.   The Sarna lotion does help with the itching in her head.   She  was willing to try the Amitriptyline but it makes her too sleepy in the morning.   Her teeth and left eye incredibly hurt.   She feels that the Gabapentin has been making her dizziness.   She asks about biofeedback.   The pain is 7/10 on average, similar to last time.   When the pain is severe it can make her depressed.   Pain Inventory Pain Right Now 10 My pain is constant, sharp, burning, stabbing, and aching  In the last 24 hours, has pain interfered with the following? General activity 10 Relation with others 8 Enjoyment of life 10 What TIME of day is your pain at its worst? morning  and evening Sleep (in general) Good  Pain is worse with: bending and standing Pain improves with: rest, heat/ice, and medication Relief from Meds: 2  Family History  Problem Relation Age of Onset  . Heart disease Mother   . Heart disease Father   . Stroke Father   . Stroke Sister   . Asthma Daughter   . Colon cancer Neg Hx    Social History   Socioeconomic History  . Marital status: Married    Spouse name: Not on file  . Number of children: 2  . Years of education: college  . Highest education level: Not on file  Occupational History  . Occupation: housewife    Employer: RETIRED  Tobacco Use  . Smoking status: Never  . Smokeless tobacco: Never  Vaping Use  . Vaping Use: Never used  Substance and Sexual Activity  . Alcohol use: No    Alcohol/week: 0.0 standard drinks of alcohol  . Drug use: No  . Sexual activity: Not on file  Other Topics Concern  . Not on file  Social History Narrative   1 cup of Jasmine tea daily    Social Determinants of Health   Financial Resource Strain: Not on file  Food Insecurity: Not on file  Transportation Needs: Not on file  Physical Activity: Not on file  Stress: Not on file  Social Connections: Not on file   Past Surgical History:  Procedure Laterality Date  . CHOLECYSTECTOMY  03/2010  . COLONOSCOPY  2009   Dr. Raford Pitcher   .  CYSTOSCOPY     Multiple Cystoscopies with stents or biopsy   . ESOPHAGOGASTRODUODENOSCOPY  10/2013  . EYE SURGERY     bilateral cataract removal  . Gamma knife radiation  01/2018   for trigeminal neuralgia  . REFRACTIVE SURGERY Bilateral 2022  . RHIZOTOMY Left 04/28/2021   Procedure: Left Percutaneous trigeminal balloon rhizotomy;  Surgeon: Judith Part, MD;  Location: [facility];  Service: Neurosurgery;  Laterality: Left;  . TONSILLECTOMY     Past Surgical History:  Procedure Laterality Date  . CHOLECYSTECTOMY  03/2010  . COLONOSCOPY  2009   Dr. Raford Pitcher   . CYSTOSCOPY     Multiple Cystoscopies with stents or biopsy   . ESOPHAGOGASTRODUODENOSCOPY  10/2013  . EYE SURGERY     bilateral cataract removal  . Gamma knife radiation  01/2018   for trigeminal neuralgia  . REFRACTIVE SURGERY Bilateral 2022  . RHIZOTOMY Left 04/28/2021   Procedure: Left Percutaneous trigeminal balloon rhizotomy;  Surgeon: Judith Part, MD;  Location: [facility];  Service: Neurosurgery;  Laterality: Left;  . TONSILLECTOMY     Past Medical History:  Diagnosis Date  . Adenomatous colon polyp 03/24/2018  . Allergy   . Arthritis   . Cataract   . Chronic headaches   . Depression   . Fibromyalgia   . Gallstones   . GERD (gastroesophageal reflux disease)   . Hyperlipemia   . Hypertension   . Interstitial cystitis   . Lactose intolerance   . LBBB (left bundle branch block)   . Osteoarthritis   . Osteopenia   . Pneumonia   . Thyroid disease   . Tinnitus   . Trigeminal neuralgia    There were no vitals taken for this visit.  Opioid Risk Score:   Fall Risk Score:  `1  Depression screen PHQ 2/9     12/25/2021    2:01 PM 12/24/2021    1:11 PM 11/27/2021   10:09 AM 10/20/2021    1:08 PM 10/20/2021   11:16 AM 09/23/2021    1:56 PM 07/31/2021   11:54 AM  Depression screen PHQ 2/9  Decreased Interest 0 1 0 1 0 0 0  Down, Depressed, Hopeless 3 1 0 '1 3 1 '$ 0  PHQ - 2 Score 3 2 0 '2 3 1 '$ 0  Altered  sleeping 0    1    Tired, decreased energy 0    2    Change in appetite 0    0    Feeling bad or failure about yourself  0    0    Trouble concentrating 0    0    Moving slowly or fidgety/restless 0    0    Suicidal thoughts 0    0    PHQ-9 Score 3    6    Difficult doing work/chores Somewhat difficult    Somewhat difficult      Review of Systems  Constitutional: Negative.   HENT: Negative.    Eyes:  Positive for visual disturbance.  Respiratory: Negative.    Cardiovascular: Negative.   Gastrointestinal: Negative.   Endocrine: Negative.   Genitourinary: Negative.   Musculoskeletal: Negative.        Pain in left side of face  Skin: Negative.   Allergic/Immunologic: Negative.   Neurological:  Positive for dizziness, tremors, light-headedness and numbness.       Tingling   Hematological: Negative.   Psychiatric/Behavioral: Negative.    All other systems reviewed and are negative.  Objective:   Physical Exam Not performed as patient was seen via phone visit    Assessment & Plan:  [name] is an 81 year old woman who presents for f/u of left sided trigeminal neuralgia and dizziness.    1) Sensory deafferation pain, left sided trigeminal neuralgia vs cluster headaches (she does have lacrimation and runny nose on that side). She does have a history of migraines.  -called [facility] to prescribe compounding gel refill -continue Gabapentin '100mg'$  up to 8 times a day, discussed her current schedule of takin -continue Fentanyl patch to 50mg and she is agreeable. Discussed that phenytoin, carbamazepine -discussed using Oragel multiple times per ay since this helps for an hour, use the topical cream intermittently in between -[facility] for her with them. Unfortunately Dr. LVan Clineshas retired but was able to set her up with Dr. SChristin Bachwho specializes in migraines and trigeminal neuralgia on July 25th. Let patient  and husband know. They will mail her their address.  -restart 12.528m fentanyl patch as patient does not feel she can get through weekend with this level of pain -commended her for calling Dr. EiDavy Piques well to let him know how she is feeling -recommended taking 1 hydrocodone now for the severe pain, and discussed that the Fentanyl patch with take 12-24 hours to kick in -prescribed Narcan while on Fentanyl -discussed with April the neurology referral and she did specify Dr. WoJuel [facility]rownd [facility] -discussed CBD oil, her son bought her some of this and she felt that it greatly helped  Recommend topical CBD oil- discussed its benefits in reducing inflammation, pain, insomnia, and anxiety, but she stopped because effect wore of.  -Discussed that CBD oil differs from marijuana in that it does not contain THC- the substance that causes euphoria.  -Discussed that it is made from the hemp plant.  -It has been used for thousands of years -preliminary research suggests that if may be able to shrink cancerous tumors, stop plaque formation in Alzheimer's Disease, and slow the progress of brain disease from concussions.  -Additional benefits that have been demonstrated in studies include improved nausea, indigestion, and brain health, and reduced seizures.  -In a survey 92% of patients who tried medical cannabis felt it improved symptoms such as chronic pain, arthritis, migraines, and cancer.   -Provided with a pain relief journal and discussed that it contains foods and lifestyle tips to naturally help to improve pain. Discussed that these lifestyle strategies are also very good for health unlike some medications which can have negative side effects. Discussed that the act of keeping a journal can be therapeutic and helpful to realize patterns what helps to trigger and alleviate pain.   -discussed with Dr. OsJoaquim Namrigeminal deafferation pain, discussed the difference  between this and trigeminal neuralgia with patient and husband, discussed motor cortex stimulation. -discussed whether or not to continue fentanyl patch, husband does feel that the IV fentanyl  -encouraged following with Dr. Sweet/Dr. MiSabra Heckt [facility] to try procedure recommended by D. Ostegard.  -stop lamotrigine as it is making her dizzy without benefit -reviewed patient's medical notes -discussed her progress with getting a referral to [facility] -recommended going to ED given darkening of skin, worsening eye closure, finding of cyst, dizziness to receive prompt neurosurgical eval regarding if surgery is necessary, and also for pain management given worsening pain refractory to all treatments and resulting suicidality. Reviewed  ED doc notes- he discussed with on-call neurology and NSGY and determined that patient could be discharged with outpatient follow-up. IV fentanyl was administered with relief to patient. She was discharged on fentanyl patch which has not provided relief. She is getting 52mg- no side effects- discussed increasing to 252m and her husband is in agreement. Prior auth completed and approved. Discussed with husband starting patch on Satruday after she has worn 1210mpatch for 72 hours -encouraged follow-up with Dr. OstJoaquim Namusband let me know appointment has been set up 1/18.  -recommended follow-up with Dr. 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  -Discussed that turmeric  -discussed steroid taper. -continue savella, husband feels like it is helping with her suicidal thoughts. Increase to '25mg'$ .  -continue hydrocodone.  -discuss mouth guard with dentist.   -d/c topamax -she does have another procedure scheduled. -discussed benefits of infrared light.  -Continue Norco '5mg'$  TID PRN -follow-up monthly with EunZella Balld with me in February, placed on waitlist for earlier appointment with me, advised phone visits/MyChart are a great way to communicate in the interim -discussed that she can take an additional one or two tylenol for breakthrough pain -recommended checking CMP for liver enzymes with next labs -prescribed NAC '600mg'$  BID to protect the liver while taking tylenol, discussed its health benefits for mitochondrial function -d/c Baclofen  -f/u with Dr. OstJoaquim Namd Dr. EicDavy Pique18 -discussed retrying carbamazepine to see if it is effective for her. It is $89 for her, so we discussed trying Lamictal instead.  -encouraged anti-inflammatory diet.  -she had sinusitis that was viral and was treated with antibiotics. This was in the early 90s3she symptoms resolved after 2 months. She uses to get sinus infections and saline rinse.  -She has had multiple root canals.  -Discontinue 100% oxygen via facemask as this was not helpful.  -Discussed that unfortunately Sprint PNS is not indicated for craniofacial pain. -Amitriptyline and Lyrica did not help.  -Continue Cymbalta '60mg'$ .  -Topiramate helped but caused dizziness.  -Provided with hand out with information regarding trigeminal neuralgia -Failed Penytoin.  -trial lidocaine patch -She would like to try biofeedback. -reviewed neurology note.  -referred to Dr. WolJuel Burrow-will try trigger point injections next visit.  -Continue Ketamine 10%, Baclofen 2%, Cyclobenzaprine 2%, Ketoprofen 10%, Gabapentin 6%, Bupivacaine 1%, Amitryptiline 5% to apply to painful areas- using 3-4 times per day.  -Can consider Meloxicam if Phenytoin does not help.  -Discussed Qutenza as an option for neuropathic pain control. Discussed that this is a capsaicin patch, stronger than capsaicin cream. Discussed that it  is currently approved for diabetic peripheral neuropathy and post-herpetic neuralgia, but that it has also shown benefit in treating other forms of neuropathy. Provided patient with link to site to learn more about the patch: httCinemaBonus.friscussed that the patch would be placed in office and benefits usually last 3 months. Discussed that unintended exposure to capsaicin can cause severe irritation of eyes, mucous membranes, respiratory tract, and skin, but that Qutenza is a local treatment and does not have the systemic side effects of other nerve medications. Discussed that there may be pain, itching, erythema, and decreased sensory function associated with the application of Qutenza. Side effects usually subside within 1  week. A cold pack of analgesic medications can help with these side effects. Blood pressure can also be increased due to pain associated with administration of the patch.  -she cut down on her gabapentin due to her dizziness    Turmeric to reduce inflammation--can be used in cooking or taken as a supplement.  Benefits of turmeric:  -Highly anti-inflammatory  -Increases antioxidants  -Improves memory, attention, brain disease  -Lowers risk of heart disease  -May help prevent cancer  -Decreases pain  -Alleviates depression  -Delays aging and decreases risk of chronic disease  -Consume with black pepper to increase absorption    Turmeric Milk Recipe:  1 cup milk  1 tsp turmeric  1 tsp cinnamon  1 tsp grated ginger (optional)  Black pepper (boosts the anti-inflammatory properties of turmeric).  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  -she is a good metabolizer of amitriptyline and has tolerated nortrypilline in the past, but without much benefit.    3) CKD: encouraged 6-8 glasses of water per day. She states that she had AKI with Ibuprofen in the past. Discussed checking Cr level next visit if we are considering Meloxicam -Discussed that Nucynta, and other pain medications, may stay in her bloodstream longer given her CKD   4) Hypernatremia: encouraged hydration   5) General health and pain: provided with list of healthy foods that are both nutritious and fight pain.   6) Depressed: Amitriptyline did help with this but she could not tolerate the drowsiness. Encouraged focusing on the factors that she can control, such as anti-inflammatory diet.   7) Fibromyalgia: Continue '30mg'$  of steroids per day for fibromyalgia. She is on that intermittently. She started this again about a month ago. She has been on this about a year. Continue Savella -discussed benefits of cold water showers.   8) Dizziness: -discussed that this could be from the combination of gabapentin and baclofen.  -discussed that all the medications we are trying for pain may worsen the dizziness  9) Constipation:  -Provided list of following foods that help with constipation and highlighted a few: 1) prunes- contain high amounts of fiber.  2) apples- has a form of dietary fiber called pectin that accelerates stool movement and increases beneficial gut bacteria 3) pears- in addition to fiber, also high in fructose and sorbitol which have laxative effect 4) figs- contain an enzyme ficin which helps to speed colonic transit 5) kiwis- contain an enzyme actinidin that improves gut motility  and reduces constipation 6) oranges- rich in pectin (like apples) 7) grapefruits- contain a flavanol naringenin which has a laxative effect 8) vegetables- rich in fiber and also great sources of folate, vitamin C, and K 9) artichoke- high in inulin, prebiotic great for the microbiome 10) chicory- increases stool frequency and softness (can be added to coffee) 11) rhubarb- laxative effect 12) sweet potato- high fiber 13) beans, peas, and lentils- contain both soluble and insoluble fiber 14) chia seeds-  improves intestinal health and gut flora 15) flaxseeds- laxative effect 16) whole grain rye bread- high in fiber 17) oat bran- high in soluble and insoluble fiber 18) kefir- softens stools -recommended to try at least one of these foods every day.  -drink 6-8 glasses of water per day -walk regularly, especially after meals.   10) Osteopenia - was tapered off the prednisone -discussed that currently her pain may be more of a risk to her than osteopenia given her suicidal ideation from her pain -discussed risk of fall with the medications that make her dizziness.   11) Daytime sleepiness -discussed that this is secondary to her medications.  -discussed stimulant to keep her more awake during the day  12) Depression: -prescribed wellbutrin -referred to behavioral therapy for counseling -referred to behavioral therapy for ketamine administration for depression  18 minutes spent in discussion of his depression, prescription of wellbutrin, referral for psychological counseling, referral for ketamine infusion for depression, refilling compounded cream

## 2022-01-06 ENCOUNTER — Encounter: Payer: Self-pay | Admitting: Physical Medicine and Rehabilitation

## 2022-01-06 ENCOUNTER — Other Ambulatory Visit: Payer: Self-pay | Admitting: Physical Medicine and Rehabilitation

## 2022-01-06 DIAGNOSIS — G500 Trigeminal neuralgia: Secondary | ICD-10-CM

## 2022-01-13 ENCOUNTER — Other Ambulatory Visit: Payer: Self-pay | Admitting: Physical Medicine and Rehabilitation

## 2022-01-13 DIAGNOSIS — G509 Disorder of trigeminal nerve, unspecified: Secondary | ICD-10-CM | POA: Diagnosis not present

## 2022-01-13 DIAGNOSIS — G500 Trigeminal neuralgia: Secondary | ICD-10-CM

## 2022-01-13 MED ORDER — FENTANYL 12 MCG/HR TD PT72: 1.0000 | MEDICATED_PATCH | TRANSDERMAL | 0 refills | Status: DC

## 2022-02-02 ENCOUNTER — Other Ambulatory Visit: Payer: Medicare Other

## 2022-02-03 ENCOUNTER — Telehealth: Payer: Self-pay | Admitting: Registered Nurse

## 2022-02-03 ENCOUNTER — Encounter: Payer: Self-pay | Admitting: Registered Nurse

## 2022-02-03 ENCOUNTER — Encounter: Payer: Medicare Other | Attending: Physical Medicine and Rehabilitation | Admitting: Registered Nurse

## 2022-02-03 VITALS — BP 145/79 | HR 66 | Ht 63.0 in | Wt 148.2 lb

## 2022-02-03 DIAGNOSIS — G9689 Other specified disorders of central nervous system: Secondary | ICD-10-CM | POA: Insufficient documentation

## 2022-02-03 DIAGNOSIS — G894 Chronic pain syndrome: Secondary | ICD-10-CM | POA: Diagnosis not present

## 2022-02-03 DIAGNOSIS — Z79891 Long term (current) use of opiate analgesic: Secondary | ICD-10-CM | POA: Insufficient documentation

## 2022-02-03 DIAGNOSIS — M797 Fibromyalgia: Secondary | ICD-10-CM | POA: Diagnosis not present

## 2022-02-03 DIAGNOSIS — Z5181 Encounter for therapeutic drug level monitoring: Secondary | ICD-10-CM | POA: Diagnosis not present

## 2022-02-03 DIAGNOSIS — G500 Trigeminal neuralgia: Secondary | ICD-10-CM | POA: Insufficient documentation

## 2022-02-03 MED ORDER — HYDROCODONE-ACETAMINOPHEN 5-325 MG PO TABS: 1.0000 | ORAL_TABLET | Freq: Four times a day (QID) | ORAL | 0 refills | Status: DC | PRN

## 2022-02-03 NOTE — Telephone Encounter (Signed)
Dr. Ranell Patrick,  Ms. Cutbirth has Fentanyl Patches 25 mcg # 6, they are stating you are allowing them to keep the Fentanyl 25 mcg patches in case her pain is increased.  She also has Fentanyl 12 mcg 11 patches., we usually destroy the medication. Mr Dame in the past wouldn't allow this provider to destroy the medication.  I asked Ms. Grassi why she has an appointment with this provider today and a telephone visit with Dr Ranell Patrick tomorrow. Mr. Diekman stated he was under the impression that I Hoyle Sauer) only prescribe medication only, and they need to speak with Dr Ranell Patrick regarding other issues.  Dr Ranell Patrick maybe this patient should be scheduled with you going forward, they don't like when they are ad questions and so forth. Will await your response. This way the front desk can scheduled her appointments with you in October and November.,  She has an appointment wth you in September and December.

## 2022-02-03 NOTE — Progress Notes (Signed)
Subjective:    Patient ID: [name], female    DOB: 05/09/1941, 81 y.o.   MRN: [id number]  HPI: [name] is a 81 y.o. female who returns for follow up appointment for chronic pain and medication refill. She states she has left facial pain radiating into her head and ear occasionally.She  rates her pain 10. Her current exercise regime is walking and performing stretching exercises.  [name] Morphine equivalent is 77.09 MME.   Oral Swab was Performed today.    Pain Inventory Average Pain 10 Pain Right Now 10 My pain is sharp, burning, dull, stabbing, tingling, and aching  In the last 24 hours, has pain interfered with the following? General activity 9 Relation with others 8 Enjoyment of life 10 What TIME of day is your pain at its worst? morning , daytime, evening, and night Sleep (in general) Good  Pain is worse with: bending, standing, and some activites Pain improves with: rest and heat/ice Relief from Meds: 0  Family History  Problem Relation Age of Onset   Heart disease Mother    Heart disease Father    Stroke Father    Stroke Sister    Asthma Daughter    Colon cancer Neg Hx    Social History   Socioeconomic History   Marital status: Married    Spouse name: Not on file   Number of children: 2   Years of education: college   Highest education level: Not on file  Occupational History   Occupation: housewife    Employer: RETIRED  Tobacco Use   Smoking status: Never   Smokeless tobacco: Never  Vaping Use   Vaping Use: Never used  Substance and Sexual Activity   Alcohol use: No    Alcohol/week: 0.0 standard drinks of alcohol   Drug use: No   Sexual activity: Not on file  Other Topics Concern   Not on file  Social History Narrative   1 cup of Jasmine tea daily    Social Determinants of Health   Financial Resource Strain: Not on file  Food Insecurity: Not on file  Transportation Needs: Not on file  Physical Activity: Not on file   Stress: Not on file  Social Connections: Not on file   Past Surgical History:  Procedure Laterality Date   CHOLECYSTECTOMY  03/2010   COLONOSCOPY  2009   Dr. Raford Pitcher    CYSTOSCOPY     Multiple Cystoscopies with stents or biopsy    ESOPHAGOGASTRODUODENOSCOPY  10/2013   EYE SURGERY     bilateral cataract removal   Gamma knife radiation  01/2018   for trigeminal neuralgia   REFRACTIVE SURGERY Bilateral 2022   RHIZOTOMY Left 04/28/2021   Procedure: Left Percutaneous trigeminal balloon rhizotomy;  Surgeon: Judith Part, MD;  Location: [place];  Service: Neurosurgery;  Laterality: Left;   TONSILLECTOMY     Past Surgical History:  Procedure Laterality Date   CHOLECYSTECTOMY  03/2010   COLONOSCOPY  2009   Dr. Raford Pitcher    CYSTOSCOPY     Multiple Cystoscopies with stents or biopsy    ESOPHAGOGASTRODUODENOSCOPY  10/2013   EYE SURGERY     bilateral cataract removal   Gamma knife radiation  01/2018   for trigeminal neuralgia   REFRACTIVE SURGERY Bilateral 2022   RHIZOTOMY Left 04/28/2021   Procedure: Left Percutaneous trigeminal balloon rhizotomy;  Surgeon: Judith Part, MD;  Location: [place];  Service: Neurosurgery;  Laterality: Left;   TONSILLECTOMY  Past Medical History:  Diagnosis Date   Adenomatous colon polyp 03/24/2018   Allergy    Arthritis    Cataract    Chronic headaches    Depression    Fibromyalgia    Gallstones    GERD (gastroesophageal reflux disease)    Hyperlipemia    Hypertension    Interstitial cystitis    Lactose intolerance    LBBB (left bundle branch block)    Osteoarthritis    Osteopenia    Pneumonia    Thyroid disease    Tinnitus    Trigeminal neuralgia    BP (!) 145/79   Pulse 66   Ht '5\' 3"'$  (1.6 m)   Wt 148 lb 3.2 oz (67.2 kg)   SpO2 95%   BMI 26.25 kg/m   Opioid Risk Score:   Fall Risk Score:  `1  Depression screen PHQ 2/9     02/03/2022    1:29 PM 12/25/2021    2:01 PM 12/24/2021    1:11 PM 11/27/2021    10:09 AM 10/20/2021    1:08 PM 10/20/2021   11:16 AM 09/23/2021    1:56 PM  Depression screen PHQ 2/9  Decreased Interest 0 0 1 0 1 0 0  Down, Depressed, Hopeless 0 3 1 0 '1 3 1  '$ PHQ - 2 Score 0 3 2 0 '2 3 1  '$ Altered sleeping  0    1   Tired, decreased energy  0    2   Change in appetite  0    0   Feeling bad or failure about yourself   0    0   Trouble concentrating  0    0   Moving slowly or fidgety/restless  0    0   Suicidal thoughts  0    0   PHQ-9 Score  3    6   Difficult doing work/chores  Somewhat difficult    Somewhat difficult       Review of Systems  Neurological:        Facial pain  All other systems reviewed and are negative.     Objective:   Physical Exam Vitals and nursing note reviewed.  Constitutional:      Appearance: Normal appearance.  Cardiovascular:     Rate and Rhythm: Normal rate and regular rhythm.     Pulses: Normal pulses.     Heart sounds: Normal heart sounds.  Pulmonary:     Effort: Pulmonary effort is normal.     Breath sounds: Normal breath sounds.  Musculoskeletal:     Cervical back: Normal range of motion and neck supple.     Comments: Normal Muscle Bulk and Muscle Testing Reveals:  Upper Extremities: Full ROM and Muscle Strength 5/5  Lower Extremities : Full ROM and Muscle Strength 5/5 Arises from Table with ease Narrow Based Gait     Skin:    General: Skin is warm and dry.  Neurological:     Mental Status: She is alert and oriented to person, place, and time.  Psychiatric:        Mood and Affect: Mood normal.        Behavior: Behavior normal.         Assessment & Plan:  Right Lumbar Radiculitis: No complaints today. Continue Gabapentin. Continue HEP as Tolerated. Continue to Monitor. 02/03/2022 Trigeminal Neuralgia: Continue Topamax 25 mg at bedtime and  Continue Gabapentin Twice a day: Prescribed by Dr Shelia Media. Continue to Monitor. 02/03/2022 Fibromyalgia: Continue  HEP as Tolerated. Continue Gabapentin. Continue to Monitor.  02/03/2022 Chronic Pain Syndrome: Sensory Deaffernation Syndrome: Continue : Fentanyl 25 MCG one  patch every three days #10 andRefilled: Hydrocodone '5mg'$ /325 mg one tablet 4 times a day as needed for pain #120. We will continue the opioid monitoring program, this consists of regular clinic visits, examinations, urine drug screen, pill counts as well as use of [place] Controlled Substance Reporting system. A 12 month History has been reviewed on the [facility] on 08/15//2023   Has a Telephone visit scheduled with Dr Ranell Patrick on 08/16.2023

## 2022-02-04 ENCOUNTER — Encounter (HOSPITAL_BASED_OUTPATIENT_CLINIC_OR_DEPARTMENT_OTHER): Payer: Medicare Other | Admitting: Physical Medicine and Rehabilitation

## 2022-02-04 DIAGNOSIS — G9689 Other specified disorders of central nervous system: Secondary | ICD-10-CM

## 2022-02-04 DIAGNOSIS — G500 Trigeminal neuralgia: Secondary | ICD-10-CM | POA: Diagnosis not present

## 2022-02-04 NOTE — Progress Notes (Signed)
Subjective:    Patient ID: [name], female    DOB: November 28, 1940, 81 y.o.   MRN: [id number]  HPI  An audio/video tele-health visit is felt to be the most appropriate encounter for this patient at this time. This is a follow up tele-visit via phone. The patient is at home. MD is at office. Prior to scheduling this appointment, our staff discussed the limitations of evaluation and management by telemedicine and the availability of in-person appointments. The patient expressed understanding and agreed to proceed.   [name] presents today for f/u of severe trigeminal neuralgia.   1) Severe trigeminal neuralgia: -she is still not doing well. -she recently went on vacation and had to spend the majority of the vacation in bed due to the severity of her pain -she stopped using the intranasal ketamine because although it helped initially, it appeared to make her pain worse later -she asks if she can wean off some medication as she had a recent appointment with Dr. Lebron Quam and was told she may have a lot of postoperative pain if she is taking so much pain medication. She would still like to do this and also experiences sedation from her medication. She asks how she would go about weaning her medications -she asks if she should wean off the gabapentin since this makes her so sleepy She asks if she should restart Topamax. -she was encouraged by her phone visit with Dr. Lebron Quam and plans to pursue surgery. She is going to [facility] next week for Brain MRI, neuropsych eval, and to discuss the risks and benefits of the procedure with Dr. Lebron Quam -pain was so severe in the past that she expressed suicidality to her husband [name].  -she feels that none of her medications are helpful but she knows that when she tries to wean them her pain worsens.  -she asks about refill of the compounding cream -her husband asks about referral to ketamine clinic for depression rather than pain -she went back to the dentist  because it was urting between two teeth and he said there is nothing wrong with those teeth and it is the trigeminal nerve When she took a shower that usually seems to help with but was migrating around her face.  -appointment with neurology in July -Dr. Lebron Quam cancelled the zoom call in [name] but this was rescheduled to July.  -she finds that only the gabapentin helps -she is not sure if the fentanyl helps When she is feeling really bad she tries to take another one but she gets very sleepy and dizzy.  -she is taking 8 a day of '100mg'$  per day- she takes two gabapentin and tylenol and hydrocodone in the morning. She takes another two at breakfast around 11, another 2 at lunch around 1:30pm.  -she had a recent root canal and pain has become much more severe after that -her pain is in the two teeth next to where she had the root canal -Oragel heps for a bout an hour.  -she tried increasing her Gabapentin to '300mg'$  TID but she could not tolerate this. It made her too sleepy.  -she contacted Dr. Davy Pique and he recommended she pause wean of Fentanyl patch and I agree with this -she took last patch last night. She still has 12.77mg patch available -her husband asks whether she should take a higher dose of gabapentin -the fentanyl patch makes her very sleepy and so allows her to sleep. It has also made her dizzier.  -the 572m  fentanyl patch did not provide more relief- she would like to wean off. Weaned to 25, then 12.28mg, then off this week -[facility] not to Dr. WJuel Burrowshe states and they need a new referral send to Dr. LVan Clines-she is using the compound cream and this helps- she uses this twice per day -capsaicin made it burn  -lamotrigine makes her too sleepy and dizzy to come down the stairs by herself and she does not feel comfortable going to the shower -left eye more open than usual. -she does not feel she is getting relief from the fentanyl patch -she feels  that the procedure at [facility] is a last resort.  -Given darkening of skin and worsening eye lid closure, MRI brain stat obtained and shows new cyst that could be post-surgical. Recommended she go to ED given worsening dizziness and facial swelling as well. ED physician discussed case with NSGY and neurology and discharged home recommending follow-up with Dr. EGean Quintand Dr. OMerceda Elks She was given IV Fentanyl in the ED which helped and discharged on 167m Fentanyl patch, which has not helped.  -discussed increasing Fentanyl patch to 2565msince she has not experienced any negative side effects, no respiratory depression, and her husband is agreeable. I have sent in new script to start Saturday, but insurance required prior auth, which was approved.  -tonight would be the third tay of the 28m79mentanyl patch -she is still taking the Gabapentin -she feels that when she lays down she is spinning.  -eye continues to appear more closed.  -Husband has called Dr. EichDionne Ano Dr. OsteEarlie Ravelingice to scheduled follow-up appointments and has appointments with both tomorrow.  -Will run out of Fentanyl patch by Sunday -pain is worsening and nothing is providing relief -she asks whether she should go to ED -she has also noted dizziness and is not sure if this is from being in the MRI or from the SaveHogelandmorphine was not effective and caused itching, sarna lotion did help wit this.  -hydrocodone has been most effective thus far -nucynta was not effective but she is not sure whether she gave it a fair trial -she has month appointments scheduled with EuniZella Fuller f/u with me in Feb -she used the Norco last night and this morning with good benefit and has started to feel the pain return this afternoon -she did find benefit from the compound cream- she continues to use this.  -She continues to have a sharp shooting pain doing around her eyelid and the tip teeth and in the top of her head.  -has a  radiofrequency ablation treatment that helped but not completely -she is not having as much sharp knife-like pin like in her eye Her top teeth in her left law have been hurting really badly.  -she now takes the baclofen with the gabapentin and they seem to work well together.  -she had surgical procedure and feels worse -yesterday she took a half a tablet but this morning she took a whole one. She does fine with the hydrocodone but a whole one makes her too sleepy.  -she is taking 2 '100mg'$  Gabapentin three times per day but it makes her dizzy and sleepy so she cannot funciton -the cold worsens her pain.  -The IV fentanyl did help with the pain.  -her husband feels she should get one more dose of the '25mg'$  fentanyl. -she did get one week benefit from the steroid injection.  -she is doing  terribly -she is miserable -she feels depressed due to the pain -she has considered taking the whole bottle of hydrocodone because because she can't take this pain anymore.  -she started taking the Gabapentin '300mg'$  TID.  -she does find relief from the hydrocodone -the pain is constant -she wonders if it is inflammation.  -she has not tried turmeric. Her son-in-law gave her a bottle and she couldn't stomach it.   2) Fibromyalgia: -She has been almost nonfunctional due to the severity of her pain. It continues to be very severe.  -She would like to wean off the Gabapentin as she feels at risk of falls -she is taking Topamax BID. She felt this was helpful but made her unsteady on her feet.  -still using gabapentin and this is helping.   3) Osteoporosis: -husband asks whether she should get a Prolia shot.   4) Dizziness: -head was swimming -she felt like she was going to fall.  -worsening -feels like she is spinning, not the room  -She tried the Phenytoin for 2 days and did not feel benefit so stopped it.   -she feels the gabapentin aggravates this and she would love to wean off but knows it helps her.    -She feels very sleepy during the day.   -She has never tried Topirmate before.   5) Sore throat -developed stopping lamotrigine since not helping.   Prior history:  Her pain continues to be in her left eye, forehead. She has not tried any topicals for pain. She uses capsaicin for her back and knees.   Since last visit nothing we have tried has helped with her pain. She has tried to decrease her Gabapentin dose but then her pain increased so she has resumed taking 4 Gabapentin per day.   Her husband asks whether he should contact her surgeon regarding whether nerve may have become unclamped.   Discussed prolotherapy, trigger point injections, Baclofen, Clonazepam, Phenytoin as other options we could try,=.  Pain continues to be excruciating and she states she is not sure how much longer she can live like this.   Prior history: She has having some nausea, lightheadedness and felt this may be related to her Gabapentin. She did not take any yesterday and then felt more sever pain in the evening. She also gets electrical shooting across her jaw.   She does not feel that she has cluster headaches as the pain has been so constant. She feels that she is getting worse. The oxygen made her feel relaxed but it is not helping.   The Sarna lotion does help with the itching in her head.   She was willing to try the Amitriptyline but it makes her too sleepy in the morning.   Her teeth and left eye incredibly hurt.   She feels that the Gabapentin has been making her dizziness.   She asks about biofeedback.   The pain is 7/10 on average, similar to last time.   When the pain is severe it can make her depressed.   Pain Inventory Pain Right Now 10 My pain is constant, sharp, burning, stabbing, and aching  In the last 24 hours, has pain interfered with the following? General activity 10 Relation with others 8 Enjoyment of life 10 What TIME of day is your pain at its worst? morning  and  evening Sleep (in general) Good  Pain is worse with: bending and standing Pain improves with: rest, heat/ice, and medication Relief from Meds: 2  Family History  Problem Relation Age of Onset   Heart disease Mother    Heart disease Father    Stroke Father    Stroke Sister    Asthma Daughter    Colon cancer Neg Hx    Social History   Socioeconomic History   Marital status: Married    Spouse name: Not on file   Number of children: 2   Years of education: college   Highest education level: Not on file  Occupational History   Occupation: housewife    Employer: RETIRED  Tobacco Use   Smoking status: Never   Smokeless tobacco: Never  Vaping Use   Vaping Use: Never used  Substance and Sexual Activity   Alcohol use: No    Alcohol/week: 0.0 standard drinks of alcohol   Drug use: No   Sexual activity: Not on file  Other Topics Concern   Not on file  Social History Narrative   1 cup of Jasmine tea daily    Social Determinants of Health   Financial Resource Strain: Not on file  Food Insecurity: Not on file  Transportation Needs: Not on file  Physical Activity: Not on file  Stress: Not on file  Social Connections: Not on file   Past Surgical History:  Procedure Laterality Date   CHOLECYSTECTOMY  03/2010   COLONOSCOPY  2009   Dr. Raford Pitcher    CYSTOSCOPY     Multiple Cystoscopies with stents or biopsy    ESOPHAGOGASTRODUODENOSCOPY  10/2013   EYE SURGERY     bilateral cataract removal   Gamma knife radiation  01/2018   for trigeminal neuralgia   REFRACTIVE SURGERY Bilateral 2022   RHIZOTOMY Left 04/28/2021   Procedure: Left Percutaneous trigeminal balloon rhizotomy;  Surgeon: Judith Part, MD;  Location: [place];  Service: Neurosurgery;  Laterality: Left;   TONSILLECTOMY     Past Surgical History:  Procedure Laterality Date   CHOLECYSTECTOMY  03/2010   COLONOSCOPY  2009   Dr. Raford Pitcher    CYSTOSCOPY     Multiple Cystoscopies with stents or  biopsy    ESOPHAGOGASTRODUODENOSCOPY  10/2013   EYE SURGERY     bilateral cataract removal   Gamma knife radiation  01/2018   for trigeminal neuralgia   REFRACTIVE SURGERY Bilateral 2022   RHIZOTOMY Left 04/28/2021   Procedure: Left Percutaneous trigeminal balloon rhizotomy;  Surgeon: Judith Part, MD;  Location: [place];  Service: Neurosurgery;  Laterality: Left;   TONSILLECTOMY     Past Medical History:  Diagnosis Date   Adenomatous colon polyp 03/24/2018   Allergy    Arthritis    Cataract    Chronic headaches    Depression    Fibromyalgia    Gallstones    GERD (gastroesophageal reflux disease)    Hyperlipemia    Hypertension    Interstitial cystitis    Lactose intolerance    LBBB (left bundle branch block)    Osteoarthritis    Osteopenia    Pneumonia    Thyroid disease    Tinnitus    Trigeminal neuralgia    There were no vitals taken for this visit.  Opioid Risk Score:   Fall Risk Score:  `1  Depression screen PHQ 2/9     02/03/2022    1:29 PM 12/25/2021    2:01 PM 12/24/2021    1:11 PM 11/27/2021   10:09 AM 10/20/2021    1:08 PM 10/20/2021   11:16 AM 09/23/2021    1:56 PM  Depression screen PHQ 2/9  Decreased Interest 0 0 1 0 1 0 0  Down, Depressed, Hopeless 0 3 1 0 '1 3 1  '$ PHQ - 2 Score 0 3 2 0 '2 3 1  '$ Altered sleeping  0    1   Tired, decreased energy  0    2   Change in appetite  0    0   Feeling bad or failure about yourself   0    0   Trouble concentrating  0    0   Moving slowly or fidgety/restless  0    0   Suicidal thoughts  0    0   PHQ-9 Score  3    6   Difficult doing work/chores  Somewhat difficult    Somewhat difficult     Review of Systems  Constitutional: Negative.   HENT: Negative.    Eyes:  Positive for visual disturbance.  Respiratory: Negative.    Cardiovascular: Negative.   Gastrointestinal: Negative.   Endocrine: Negative.   Genitourinary: Negative.   Musculoskeletal: Negative.        Pain in left side of face  Skin: Negative.    Allergic/Immunologic: Negative.   Neurological:  Positive for dizziness, tremors, light-headedness and numbness.       Tingling   Hematological: Negative.   Psychiatric/Behavioral: Negative.    All other systems reviewed and are negative.      Objective:   Physical Exam Not performed as patient was seen via phone visit    Assessment & Plan:  Mrs. Iezzi is an 81 year old woman who presents for f/u of left sided trigeminal neuralgia and dizziness.    1) Sensory deafferation pain, left sided trigeminal neuralgia vs cluster headaches (she does have lacrimation and runny nose on that side). She does have a history of migraines.  -reviewed Dr. Billey Chang note with her, discussed the plan for her visit to [facility] next week -ordered MRI Brain with and without contrast, discussed the plan for repeat MRI brain next week -placed referral to pain psychology, discussed plan for this eval next week -recommended weaning off Fentanyl -recommended trying [name] mixed in coconut oil and applied to area of pain -recommended restarting Topamax -called [facility] to prescribe compounding gel refill -continue Gabapentin '100mg'$  up to 8 times a day, discussed her current schedule of takin -continue Fentanyl patch to 57mg and she is agreeable. Discussed that phenytoin, carbamazepine -discussed using Oragel multiple times per ay since this helps for an hour, use the topical cream intermittently in between -[facility] to set up an appointment for her with them. Unfortunately Dr. LVan Clineshas retired but was able to set her up with Dr. SChristin Bachwho specializes in migraines and trigeminal neuralgia on July 25th. Let patient and husband know. They will mail her their address.  -restart 12.518m fentanyl patch as patient does not feel she can get through weekend with this level of pain -commended her for calling Dr. EiDavy Piques well to let him know  how she is feeling -recommended taking 1 hydrocodone now for the severe pain, and discussed that the Fentanyl patch with take 12-24 hours to kick in -prescribed Narcan while on Fentanyl -discussed with [name] the neurology referral and she did specify Dr. WoJuel Burrownd [facility] -discussed CBD oil, her son bought her some of this and she felt that it greatly helped  Recommend topical CBD oil- discussed its benefits in reducing  inflammation, pain, insomnia, and anxiety, but she stopped because effect wore of.  -Discussed that CBD oil differs from marijuana in that it does not contain THC- the substance that causes euphoria.  -Discussed that it is made from the hemp plant.  -It has been used for thousands of years -preliminary research suggests that if may be able to shrink cancerous tumors, stop plaque formation in Alzheimer's Disease, and slow the progress of brain disease from concussions.  -Additional benefits that have been demonstrated in studies include improved nausea, indigestion, and brain health, and reduced seizures.  -In a survey 92% of patients who tried medical cannabis felt it improved symptoms such as chronic pain, arthritis, migraines, and cancer.   -Provided with a pain relief journal and discussed that it contains foods and lifestyle tips to naturally help to improve pain. Discussed that these lifestyle strategies are also very good for health unlike some medications which can have negative side effects. Discussed that the act of keeping a journal can be therapeutic and helpful to realize patterns what helps to trigger and alleviate pain.   -discussed with Dr. Joaquim Nam trigeminal deafferation pain, discussed the difference between this and trigeminal neuralgia with patient and husband, discussed motor cortex stimulation. -discussed whether or not to continue fentanyl patch, husband does feel that the IV fentanyl  -encouraged following with Dr.  Sweet/Dr. Sabra Heck at [facility] to try procedure recommended by D. Ostegard.  -stop lamotrigine as it is making her dizzy without benefit -reviewed patient's medical notes -discussed her progress with getting a referral to [facility] -recommended going to ED given darkening of skin, worsening eye closure, finding of cyst, dizziness to receive prompt neurosurgical eval regarding if surgery is necessary, and also for pain management given worsening pain refractory to all treatments and resulting suicidality. Reviewed ED doc notes- he discussed with on-call neurology and NSGY and determined that patient could be discharged with outpatient follow-up. IV fentanyl was administered with relief to patient. She was discharged on fentanyl patch which has not provided relief. She is getting 43mg- no side effects- discussed increasing to 235m and her husband is in agreement. Prior auth completed and approved. Discussed with husband starting patch on Satruday after she has worn 1214mpatch for 72 hours -encouraged follow-up with Dr. OstJoaquim Namusband let me know appointment has been set up 1/18.  -recommended follow-up with Dr. 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  -Discussed that turmeric  -discussed steroid taper. -continue savella, husband feels like it is helping with her suicidal thoughts. Increase to '25mg'$ .  -continue hydrocodone.  -discuss mouth guard with dentist.  -d/c topamax -she does have another procedure scheduled. -discussed benefits of infrared light.  -Continue Norco '5mg'$  TID PRN -follow-up monthly with Kristina Balld with me in February, placed on waitlist for earlier  appointment with me, advised phone visits/MyChart are a great way to communicate in the interim -discussed that she can take an additional one or two tylenol for breakthrough pain -recommended checking CMP for liver enzymes with next labs -prescribed NAC '600mg'$  BID to protect the liver while taking tylenol, discussed its health benefits for mitochondrial function -d/c Baclofen  -f/u with Dr. OstJoaquim Namd Dr. EicDavy Pique18 -discussed retrying carbamazepine to see if  it is effective for her. It is $89 for her, so we discussed trying Lamictal instead.  -encouraged anti-inflammatory diet.  -she had sinusitis that was viral and was treated with antibiotics. This was in the early 76s. The symptoms resolved after 2 months. She uses to get sinus infections and saline rinse.  -She has had multiple root canals.  -Discontinue 100% oxygen via facemask as this was not helpful.  -Discussed that unfortunately Sprint PNS is not indicated for craniofacial pain. -Amitriptyline and [name] did not help.  -Continue Cymbalta '60mg'$ .  -Topiramate helped but caused dizziness.  -Provided with hand out with information regarding trigeminal neuralgia -Failed Penytoin.  -trial lidocaine patch -She would like to try biofeedback. -reviewed neurology note.  -referred to Dr. Juel Burrow.  -will try trigger point injections next visit.  -Continue Ketamine 10%, Baclofen 2%, Cyclobenzaprine 2%, Ketoprofen 10%, Gabapentin 6%, Bupivacaine 1%, Amitryptiline 5% to apply to painful areas- using 3-4 times per day.  -Can consider Meloxicam if Phenytoin does not help.  -Discussed Qutenza as an option for neuropathic pain control. Discussed that this is a capsaicin patch, stronger than capsaicin cream. Discussed that it is currently approved for diabetic peripheral neuropathy and post-herpetic neuralgia, but that it has also shown benefit in treating other forms of neuropathy. Provided patient with link to site to learn more about the  patch: CinemaBon[employer].fr. Discussed that the patch would be placed in office and benefits usually last 3 months. Discussed that unintended exposure to capsaicin can cause severe irritation of eyes, mucous membranes, respiratory tract, and skin, but that Qutenza is a local treatment and does not have the systemic side effects of other nerve medications. Discussed that there may be pain, itching, erythema, and decreased sensory function associated with the application of Qutenza. Side effects usually subside within 1 week. A cold pack of analgesic medications can help with these side effects. Blood pressure can also be increased due to pain associated with administration of the patch.  -she cut down on her gabapentin due to her dizziness    Turmeric to reduce inflammation--can be used in cooking or taken as a supplement.  Benefits of turmeric:  -Highly anti-inflammatory  -Increases antioxidants  -Improves memory, attention, brain disease  -Lowers risk of heart disease  -May help prevent cancer  -Decreases pain  -Alleviates depression  -Delays aging and decreases risk of chronic disease  -Consume with black pepper to increase absorption    Turmeric Milk Recipe:  1 cup milk  1 tsp turmeric  1 tsp cinnamon  1 tsp grated ginger (optional)  Black pepper (boosts the anti-inflammatory properties of turmeric).  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  -she is a good metabolizer of amitriptyline and has tolerated nortrypilline in the past, but without much benefit.    3) CKD: encouraged 6-8 glasses of water per day. She states that she had AKI with Ibuprofen in the past. Discussed checking Cr level next visit if we  are considering Meloxicam -Discussed that Nucynta, and other pain medications, may stay in her bloodstream longer given her CKD   4) Hypernatremia: encouraged hydration   5) General health and pain: provided with list of healthy foods that are both nutritious and fight pain.   6) Depressed: Amitriptyline did help with this but she could not tolerate the drowsiness. Encouraged focusing on the factors that she can control, such as anti-inflammatory diet.   7) Fibromyalgia: Continue '30mg'$  of steroids per day for fibromyalgia. She is on that intermittently. She started this again about a month ago. She has been on this about a year. Continue Savella -discussed benefits of cold water showers.   8) Dizziness: -discussed that this could be from the combination of gabapentin and baclofen.  -discussed that all the medications we are trying for pain may worsen the dizziness  9) Constipation:  -Provided list of following foods that help with constipation and highlighted a few: 1) prunes- contain high amounts of fiber.  2) apples- has a form of dietary fiber called pectin that accelerates stool movement and increases beneficial gut bacteria 3) pears- in addition to fiber, also high in fructose and sorbitol which have laxative effect 4) figs- contain an enzyme ficin which helps to speed colonic transit 5) kiwis- contain an enzyme actinidin that improves gut motility and reduces constipation 6) oranges- rich in pectin (like apples) 7) grapefruits- contain a flavanol naringenin which has a laxative effect 8) vegetables- rich in fiber and also great sources of folate,  vitamin C, and K 9) artichoke- high in inulin, prebiotic great for the microbiome 10) chicory- increases stool frequency and softness (can be added to coffee) 11) rhubarb- laxative effect 12) sweet potato- high fiber 13) beans, peas, and lentils- contain both soluble and insoluble fiber 14) chia seeds- improves intestinal health and gut flora 15) flaxseeds- laxative effect 16) whole grain rye bread- high in fiber 17) oat bran- high in soluble and insoluble fiber 18) kefir- softens stools -recommended to try at least one of these foods every day.  -drink 6-8 glasses of water per day -walk regularly, especially after meals.   10) Osteopenia - was tapered off the prednisone -discussed that currently her pain may be more of a risk to her than osteopenia given her suicidal ideation from her pain -discussed risk of fall with the medications that make her dizziness.   11) Daytime sleepiness -discussed that this is secondary to her medications.  -discussed stimulant to keep her more awake during the day  12) Depression: -prescribed wellbutrin -referred to behavioral therapy  15 minutes spent in discussion of the severity of her pain, her plan to have follow-up with Dr. Lebron Quam next week preceded by repeat MRI of her brain and pain psychology eval, recommended stopping Fentanyl, trying [name] mixed into coconut oil, discussed potential wean for hydrocodone and gabapentin if she would want, how to restart Fentanyl if pain escalates

## 2022-02-06 LAB — DRUG TOX MONITOR 1 W/CONF, ORAL FLD
Amphetamines: NEGATIVE ng/mL (ref <10)
Barbiturates: NEGATIVE ng/mL (ref <10)
Benzodiazepines: NEGATIVE ng/mL (ref <0.50)
Buprenorphine: NEGATIVE ng/mL (ref <0.10)
Cocaine: NEGATIVE ng/mL (ref <5.0)
Codeine: NEGATIVE ng/mL (ref <2.5)
Dihydrocodeine: 18.3 ng/mL — ABNORMAL HIGH (ref <2.5)
Fentanyl: 3.98 ng/mL — ABNORMAL HIGH (ref <0.10)
Fentanyl: POSITIVE ng/mL — AB (ref <0.10)
Heroin Metabolite: NEGATIVE ng/mL (ref <1.0)
Hydrocodone: 97.5 ng/mL — ABNORMAL HIGH (ref <2.5)
Hydromorphone: NEGATIVE ng/mL (ref <2.5)
MARIJUANA: NEGATIVE ng/mL (ref <2.5)
MDMA: NEGATIVE ng/mL (ref <10)
Meprobamate: NEGATIVE ng/mL (ref <2.5)
Methadone: NEGATIVE ng/mL (ref <5.0)
Morphine: NEGATIVE ng/mL (ref <2.5)
Nicotine Metabolite: NEGATIVE ng/mL (ref <5.0)
Norhydrocodone: 6.7 ng/mL — ABNORMAL HIGH (ref <2.5)
Noroxycodone: NEGATIVE ng/mL (ref <2.5)
Opiates: POSITIVE ng/mL — AB (ref <2.5)
Oxycodone: NEGATIVE ng/mL (ref <2.5)
Oxymorphone: NEGATIVE ng/mL (ref <2.5)
Phencyclidine: NEGATIVE ng/mL (ref <10)
Tapentadol: NEGATIVE ng/mL (ref <5.0)
Tramadol: NEGATIVE ng/mL (ref <5.0)
Zolpidem: NEGATIVE ng/mL (ref <5.0)

## 2022-02-06 LAB — DRUG TOX ALC METAB W/CON, ORAL FLD: Alcohol Metabolite: NEGATIVE ng/mL (ref <25)

## 2022-02-10 ENCOUNTER — Telehealth: Payer: Self-pay | Admitting: *Deleted

## 2022-02-10 NOTE — Telephone Encounter (Signed)
Oral swab drug screen was consistent for prescribed medications.  ?

## 2022-02-11 DIAGNOSIS — F4542 Pain disorder with related psychological factors: Secondary | ICD-10-CM | POA: Diagnosis not present

## 2022-02-11 DIAGNOSIS — F3289 Other specified depressive episodes: Secondary | ICD-10-CM | POA: Diagnosis not present

## 2022-02-12 DIAGNOSIS — R519 Headache, unspecified: Secondary | ICD-10-CM | POA: Diagnosis not present

## 2022-02-12 DIAGNOSIS — G509 Disorder of trigeminal nerve, unspecified: Secondary | ICD-10-CM | POA: Diagnosis not present

## 2022-02-13 DIAGNOSIS — M792 Neuralgia and neuritis, unspecified: Secondary | ICD-10-CM | POA: Diagnosis not present

## 2022-02-13 DIAGNOSIS — G509 Disorder of trigeminal nerve, unspecified: Secondary | ICD-10-CM | POA: Diagnosis not present

## 2022-02-24 ENCOUNTER — Encounter
Payer: Medicare Other | Attending: Physical Medicine and Rehabilitation | Admitting: Physical Medicine and Rehabilitation

## 2022-02-24 VITALS — BP 118/73 | HR 69 | Ht 63.0 in | Wt 149.2 lb

## 2022-02-24 DIAGNOSIS — M797 Fibromyalgia: Secondary | ICD-10-CM | POA: Insufficient documentation

## 2022-02-24 DIAGNOSIS — G500 Trigeminal neuralgia: Secondary | ICD-10-CM | POA: Insufficient documentation

## 2022-02-24 DIAGNOSIS — G9689 Other specified disorders of central nervous system: Secondary | ICD-10-CM | POA: Insufficient documentation

## 2022-02-24 MED ORDER — DULOXETINE HCL 30 MG PO CPEP: 90.0000 mg | ORAL_CAPSULE | Freq: Three times a day (TID) | ORAL | 3 refills | Status: DC

## 2022-02-24 MED ORDER — DULOXETINE HCL 30 MG PO CPEP
90.0000 mg | ORAL_CAPSULE | Freq: Every day | ORAL | 3 refills | Status: DC
Start: 2022-02-24 — End: 2022-09-14

## 2022-02-24 NOTE — Addendum Note (Signed)
Addended by: Izora Ribas on: 02/24/2022 12:45 PM   Modules accepted: Orders

## 2022-02-24 NOTE — Progress Notes (Signed)
Subjective:    Patient ID: [name], female    DOB: [birth year], 81 y.o.   MRN: [id number]  HPI    [name] presents today for f/u of severe trigeminal neuralgia.   1) Severe trigeminal neuralgia: -she is still not doing well. -drove up on Tuesday and on Wednesday was supposed to have an appointment with pain psychologist. When they got there they were told that appointment had been changed to virtual. The next day they had a more sophisticated MRI. [name] did moving in MRI so MRI was not as helpful. The next day they saw Dr. Lebron Quam and discussed several procedural options and then went back to the motor stimulator- that is scheduled on October 16th.  -she recently went on vacation and had to spend the majority of the vacation in bed due to the severity of her pain -she stopped using the intranasal ketamine because although it helped initially, it appeared to make her pain worse later -she asks if she can wean off some medication as she had a recent appointment with Dr. Lebron Quam and was told she may have a lot of postoperative pain if she is taking so much pain medication. She would still like to do this and also experiences sedation from her medication. She asks how she would go about weaning her medications -she asks if she should wean off the gabapentin since this makes her so sleepy She asks if she should restart Topamax. -she was encouraged by her phone visit with Dr. Lebron Quam and plans to pursue surgery. She is going to [facility] next week for Brain MRI, neuropsych eval, and to discuss the risks and benefits of the procedure with Dr. Lebron Quam -pain was so severe in the past that she expressed suicidality to her husband [name].  -she feels that none of her medications are helpful but she knows that when she tries to wean them her pain worsens.  -she asks about refill of the compounding cream -her husband asks about referral to ketamine clinic for depression rather than pain -she went back to  the dentist because it was urting between two teeth and he said there is nothing wrong with those teeth and it is the trigeminal nerve When she took a shower that usually seems to help with but was migrating around her face.  -appointment with neurology in July -Dr. Lebron Quam cancelled the zoom call in [name] but this was rescheduled to July.  -she finds that only the gabapentin helps -she is not sure if the fentanyl helps When she is feeling really bad she tries to take another one but she gets very sleepy and dizzy.  -she is taking 8 a day of '100mg'$  per day- she takes two gabapentin and tylenol and hydrocodone in the morning. She takes another two at breakfast around 11, another 2 at lunch around 1:30pm.  -she had a recent root canal and pain has become much more severe after that -her pain is in the two teeth next to where she had the root canal -Oragel heps for a bout an hour.  -she tried increasing her Gabapentin to '300mg'$  TID but she could not tolerate this. It made her too sleepy.  -she contacted Dr. Davy Pique and he recommended she pause wean of Fentanyl patch and I agree with this -she took last patch last night. She still has 12.64mg patch available -her husband asks whether she should take a higher dose of gabapentin -the fentanyl patch makes her very sleepy and so allows  her to sleep. It has also made her dizzier.  -the 20mg fentanyl patch did not provide more relief- she would like to wean off. Weaned to 25, then 12.510m, then off this week -[facility] not to Dr. WoJuel Burrowhe states and they need a new referral send to Dr. LiVan Clinesshe is using the compound cream and this helps- she uses this twice per day -capsaicin made it burn  -lamotrigine makes her too sleepy and dizzy to come down the stairs by herself and she does not feel comfortable going to the shower -left eye more open than usual. -she does not feel she is getting relief from the fentanyl  patch -she feels that the procedure at [facility] is a last resort.  -Given darkening of skin and worsening eye lid closure, MRI brain stat obtained and shows new cyst that could be post-surgical. Recommended she go to ED given worsening dizziness and facial swelling as well. ED physician discussed case with NSGY and neurology and discharged home recommending follow-up with Dr. EiGean Quintnd Dr. OsMerceda ElksShe was given IV Fentanyl in the ED which helped and discharged on 1229mFentanyl patch, which has not helped.  -discussed increasing Fentanyl patch to 45m71mince she has not experienced any negative side effects, no respiratory depression, and her husband is agreeable. I have sent in new script to start Saturday, but insurance required prior auth, which was approved.  -tonight would be the third tay of the 45mc52mntanyl patch -she is still taking the Gabapentin -she feels that when she lays down she is spinning.  -eye continues to appear more closed.  -Husband has called Dr. EichmDionne AnoDr. OstegEarlie Ravelingce to scheduled follow-up appointments and has appointments with both tomorrow.  -Will run out of Fentanyl patch by Sunday -pain is worsening and nothing is providing relief -she asks whether she should go to ED -she has also noted dizziness and is not sure if this is from being in the MRI or from the SavelMiddletonorphine was not effective and caused itching, sarna lotion did help wit this.  -hydrocodone has been most effective thus far -nucynta was not effective but she is not sure whether she gave it a fair trial -she has month appointments scheduled with EunicZella Ballf/u with me in Feb -she used the Norco last night and this morning with good benefit and has started to feel the pain return this afternoon -she did find benefit from the compound cream- she continues to use this.  -She continues to have a sharp shooting pain doing around her eyelid and the tip teeth and in the top of her  head.  -has a radiofrequency ablation treatment that helped but not completely -she is not having as much sharp knife-like pin like in her eye Her top teeth in her left law have been hurting really badly.  -she now takes the baclofen with the gabapentin and they seem to work well together.  -she had surgical procedure and feels worse -yesterday she took a half a tablet but this morning she took a whole one. She does fine with the hydrocodone but a whole one makes her too sleepy.  -she is taking 2 '100mg'$  Gabapentin three times per day but it makes her dizzy and sleepy so she cannot funciton -the cold worsens her pain.  -The IV fentanyl did help with the pain.  -her husband feels she should get one more dose of the '25mg'$  fentanyl. -she did  get one week benefit from the steroid injection.  -she is doing terribly -she is miserable -she feels depressed due to the pain -she has considered taking the whole bottle of hydrocodone because because she can't take this pain anymore.  -she started taking the Gabapentin '300mg'$  TID.  -she does find relief from the hydrocodone -the pain is constant -she wonders if it is inflammation.  -she has not tried turmeric. Her son-in-law gave her a bottle and she couldn't stomach it.   2) Fibromyalgia: -She has been almost nonfunctional due to the severity of her pain. It continues to be very severe.  -She would like to wean off the Gabapentin as she feels at risk of falls -she is taking Topamax BID. She felt this was helpful but made her unsteady on her feet.  -still using gabapentin and this is helping.   3) Osteoporosis: -husband asks whether she should get a Prolia shot.   4) Dizziness: -head was swimming -she felt like she was going to fall.  -worsening -feels like she is spinning, not the room  -She tried the Phenytoin for 2 days and did not feel benefit so stopped it.   -she feels the gabapentin aggravates this and she would love to wean off but  knows it helps her.   -She feels very sleepy during the day.   -She has never tried Topirmate before.   5) Sore throat -developed stopping lamotrigine since not helping.   Prior history:  Her pain continues to be in her left eye, forehead. She has not tried any topicals for pain. She uses capsaicin for her back and knees.   Since last visit nothing we have tried has helped with her pain. She has tried to decrease her Gabapentin dose but then her pain increased so she has resumed taking 4 Gabapentin per day.   Her husband asks whether he should contact her surgeon regarding whether nerve may have become unclamped.   Discussed prolotherapy, trigger point injections, Baclofen, Clonazepam, Phenytoin as other options we could try,=.  Pain continues to be excruciating and she states she is not sure how much longer she can live like this.   Prior history: She has having some nausea, lightheadedness and felt this may be related to her Gabapentin. She did not take any yesterday and then felt more sever pain in the evening. She also gets electrical shooting across her jaw.   She does not feel that she has cluster headaches as the pain has been so constant. She feels that she is getting worse. The oxygen made her feel relaxed but it is not helping.   The Sarna lotion does help with the itching in her head.   She was willing to try the Amitriptyline but it makes her too sleepy in the morning.   Her teeth and left eye incredibly hurt.   She feels that the Gabapentin has been making her dizziness.   She asks about biofeedback.   The pain is 7/10 on average, similar to last time.   When the pain is severe it can make her depressed.   Pain Inventory Pain Right Now 10 My pain is constant, sharp, burning, stabbing, and aching  In the last 24 hours, has pain interfered with the following? General activity 10 Relation with others 8 Enjoyment of life 10 What TIME of day is your pain at its  worst? morning  and evening Sleep (in general) Good  Pain is worse with: bending and standing Pain improves  with: rest, heat/ice, and medication Relief from Meds: 2  Family History  Problem Relation Age of Onset   Heart disease Mother    Heart disease Father    Stroke Father    Stroke Sister    Asthma Daughter    Colon cancer Neg Hx    Social History   Socioeconomic History   Marital status: Married    Spouse name: Not on file   Number of children: 2   Years of education: college   Highest education level: Not on file  Occupational History   Occupation: housewife    Employer: RETIRED  Tobacco Use   Smoking status: Never   Smokeless tobacco: Never  Vaping Use   Vaping Use: Never used  Substance and Sexual Activity   Alcohol use: No    Alcohol/week: 0.0 standard drinks of alcohol   Drug use: No   Sexual activity: Not on file  Other Topics Concern   Not on file  Social History Narrative   1 cup of Jasmine tea daily    Social Determinants of Health   Financial Resource Strain: Not on file  Food Insecurity: Not on file  Transportation Needs: Not on file  Physical Activity: Not on file  Stress: Not on file  Social Connections: Not on file   Past Surgical History:  Procedure Laterality Date   CHOLECYSTECTOMY  03/2010   COLONOSCOPY  2009   Dr. Raford Pitcher    CYSTOSCOPY     Multiple Cystoscopies with stents or biopsy    ESOPHAGOGASTRODUODENOSCOPY  10/2013   EYE SURGERY     bilateral cataract removal   Gamma knife radiation  01/2018   for trigeminal neuralgia   REFRACTIVE SURGERY Bilateral 2022   RHIZOTOMY Left 04/28/2021   Procedure: Left Percutaneous trigeminal balloon rhizotomy;  Surgeon: Judith Part, MD;  Location: [facility];  Service: Neurosurgery;  Laterality: Left;   TONSILLECTOMY     Past Surgical History:  Procedure Laterality Date   CHOLECYSTECTOMY  03/2010   COLONOSCOPY  2009   Dr. Raford Pitcher    CYSTOSCOPY     Multiple Cystoscopies  with stents or biopsy    ESOPHAGOGASTRODUODENOSCOPY  10/2013   EYE SURGERY     bilateral cataract removal   Gamma knife radiation  01/2018   for trigeminal neuralgia   REFRACTIVE SURGERY Bilateral 2022   RHIZOTOMY Left 04/28/2021   Procedure: Left Percutaneous trigeminal balloon rhizotomy;  Surgeon: Judith Part, MD;  Location: [facility];  Service: Neurosurgery;  Laterality: Left;   TONSILLECTOMY     Past Medical History:  Diagnosis Date   Adenomatous colon polyp 03/24/2018   Allergy    Arthritis    Cataract    Chronic headaches    Depression    Fibromyalgia    Gallstones    GERD (gastroesophageal reflux disease)    Hyperlipemia    Hypertension    Interstitial cystitis    Lactose intolerance    LBBB (left bundle branch block)    Osteoarthritis    Osteopenia    Pneumonia    Thyroid disease    Tinnitus    Trigeminal neuralgia    There were no vitals taken for this visit.  Opioid Risk Score:   Fall Risk Score:  `1  Depression screen PHQ 2/9     02/03/2022    1:29 PM 12/25/2021    2:01 PM 12/24/2021    1:11 PM 11/27/2021   10:09 AM 10/20/2021    1:08 PM 10/20/2021  11:16 AM 09/23/2021    1:56 PM  Depression screen PHQ 2/9  Decreased Interest 0 0 1 0 1 0 0  Down, Depressed, Hopeless 0 3 1 0 '1 3 1  '$ PHQ - 2 Score 0 3 2 0 '2 3 1  '$ Altered sleeping  0    1   Tired, decreased energy  0    2   Change in appetite  0    0   Feeling bad or failure about yourself   0    0   Trouble concentrating  0    0   Moving slowly or fidgety/restless  0    0   Suicidal thoughts  0    0   PHQ-9 Score  3    6   Difficult doing work/chores  Somewhat difficult    Somewhat difficult     Review of Systems  Constitutional: Negative.   HENT: Negative.    Eyes:  Positive for visual disturbance.  Respiratory: Negative.    Cardiovascular: Negative.   Gastrointestinal: Negative.   Endocrine: Negative.   Genitourinary: Negative.   Musculoskeletal: Negative.        Pain in left side of face   Skin: Negative.   Allergic/Immunologic: Negative.   Neurological:  Positive for dizziness, tremors, light-headedness and numbness.       Tingling   Hematological: Negative.   Psychiatric/Behavioral: Negative.    All other systems reviewed and are negative.      Objective:   Physical Exam Gen: no distress, normal appearing HEENT: oral mucosa pink and moist, NCAT Cardio: Reg rate Chest: normal effort, normal rate of breathing Abd: soft, non-distended Ext: no edema Psych: pleasant, normal affect Skin: intact Neuro: Alert and oriented x3    Assessment & Plan:  Mrs. Swendsen is an 81 year old woman who presents for f/u of left sided trigeminal neuralgia and dizziness.    1) Sensory deafferation pain, left sided trigeminal neuralgia vs cluster headaches (she does have lacrimation and runny nose on that side). She does have a history of migraines.  -reviewed Dr. Billey Chang note with her, discussed the plan for her visit to [facility] next October.  -ordered MRI Brain with and without contrast, discussed the plan for repeat MRI brain next October -discussed her follow-up with pain psychology.  -recommended weaning off Fentanyl, discussed stopping fentanyl because she is on 12.'5mg'$  today but she had tried this a couple of weeks ago and pain was so severe that she needed patch to be replaced.  -discussed that she is taking hydrocodone 4 tablets per day, and recommend to decrease this to 3 tabs per day for three days and then to keep decreasing the tab by once per day.  -discussed that skipping the morning dose of hydrocodone will at least allow her to sleep -recommended trying Frankincense and Myrrh essential oils mixed in coconut oil and applied to area of pain -recommended restarting Topamax -called [facility] to prescribe compounding gel refill -continue Gabapentin '100mg'$  up to 8 times a day, discussed her current schedule of takin -continue Fentanyl patch to 1mg and she is  agreeable. Discussed that phenytoin, carbamazepine -discussed using Oragel multiple times per ay since this helps for an hour, use the topical cream intermittently in between -[facility] to [facility] appointment for her with them. Unfortunately Dr. LVan Clineshas retired but was able to set her up with Dr. SChristin Bachwho specializes in migraines and trigeminal neuralgia on July  25th. Let patient and husband know. They will mail her their address.  -restart 12.50mh fentanyl patch as patient does not feel she can get through weekend with this level of pain -commended her for calling Dr. EDavy Piqueas well to let him know how she is feeling -recommended taking 1 hydrocodone now for the severe pain, and discussed that the Fentanyl patch with take 12-24 hours to kick in -prescribed Narcan while on Fentanyl -discussed with [name] the neurology referral and she did specify Dr. WJuel [facility]and [facility] -discussed CBD oil, her son bought her some of this and she felt that it greatly helped  Recommend topical CBD oil- discussed its benefits in reducing inflammation, pain, insomnia, and anxiety, but she stopped because effect wore of.  -Discussed that CBD oil differs from marijuana in that it does not contain THC- the substance that causes euphoria.  -Discussed that it is made from the hemp plant.  -It has been used for thousands of years -preliminary research suggests that if may be able to shrink cancerous tumors, stop plaque formation in Alzheimer's Disease, and slow the progress of brain disease from concussions.  -Additional benefits that have been demonstrated in studies include improved nausea, indigestion, and brain health, and reduced seizures.  -In a survey 92% of patients who tried medical cannabis felt it improved symptoms such as chronic pain, arthritis, migraines, and cancer.   -Provided with a pain relief journal and discussed that it  contains foods and lifestyle tips to naturally help to improve pain. Discussed that these lifestyle strategies are also very good for health unlike some medications which can have negative side effects. Discussed that the act of keeping a journal can be therapeutic and helpful to realize patterns what helps to trigger and alleviate pain.   -discussed with Dr. OJoaquim Namtrigeminal deafferation pain, discussed the difference between this and trigeminal neuralgia with patient and husband, discussed motor cortex stimulation. -discussed whether or not to continue fentanyl patch, husband does feel that the IV fentanyl  -encouraged following with Dr. Sweet/Dr. MSabra Heckat [facility] to try procedure recommended by D. Ostegard.  -stop lamotrigine as it is making her dizzy without benefit -reviewed patient's medical notes -discussed her progress with getting a referral to [facility] -recommended going to ED given darkening of skin, worsening eye closure, finding of cyst, dizziness to receive prompt neurosurgical eval regarding if surgery is necessary, and also for pain management given worsening pain refractory to all treatments and resulting suicidality. Reviewed ED doc notes- he discussed with on-call neurology and NSGY and determined that patient could be discharged with outpatient follow-up. IV fentanyl was administered with relief to patient. She was discharged on fentanyl patch which has not provided relief. She is getting 121m- no side effects- discussed increasing to 2570mand her husband is in agreement. Prior auth completed and approved. Discussed with husband starting patch on Satruday after she has worn 30m3match for 72 hours -encouraged follow-up with Dr. OsteJoaquim Namsband let me know appointment has been set up 1/18.  -recommended follow-up with Dr. 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  -Discussed that turmeric  -discussed steroid taper. -continue savella, husband feels like it is helping with  her suicidal thoughts. Increase to '25mg'$ .  -continue hydrocodone.  -discuss mouth guard with dentist.  -d/c topamax -she does have another procedure scheduled. -discussed benefits of infrared light.  -Continue Norco '5mg'$  TID PRN -follow-up monthly with Zella Ball and with me in February, placed on waitlist for earlier appointment with me, advised phone visits/MyChart are a great way to communicate in the interim -discussed that she can take an additional one or two tylenol for breakthrough pain -recommended checking CMP for liver enzymes with next labs -prescribed NAC '600mg'$  BID to protect the liver while taking tylenol, discussed its health benefits for mitochondrial function -d/c Baclofen  -f/u with Dr. Joaquim Nam and Dr. Davy Pique 1/18 -discussed retrying carbamazepine to see if it is effective for her. It is $89 for her, so we discussed trying Lamictal instead.  -encouraged anti-inflammatory diet.  -she had sinusitis that was viral and was treated with antibiotics. This was in the early 47s. The symptoms resolved after 2 months. She uses to get sinus infections and saline rinse.  -She has had multiple root canals.  -Discontinue 100% oxygen via facemask as this was not helpful.  -Discussed that unfortunately Sprint PNS is not indicated for craniofacial pain. -Amitriptyline and Lyrica did not help.  -Continue Cymbalta '60mg'$ .  -Topiramate helped but caused dizziness.  -Provided with hand out with information regarding trigeminal neuralgia -Failed Penytoin.  -trial lidocaine patch -She would like to try biofeedback. -reviewed neurology note.  -referred to Dr. Juel Burrow.   -will try trigger point injections next visit.  -Continue Ketamine 10%, Baclofen 2%, Cyclobenzaprine 2%, Ketoprofen 10%, Gabapentin 6%, Bupivacaine 1%, Amitryptiline 5% to apply to painful areas- using 3-4 times per day.  -Can consider Meloxicam if Phenytoin does not help.  -Discussed Qutenza as an option for neuropathic pain control. Discussed that this is a capsaicin patch, stronger than capsaicin cream. Discussed that it is currently approved for diabetic peripheral neuropathy and post-herpetic neuralgia, but that it has also shown benefit in treating other forms of neuropathy. Provided patient with link to site to learn more about the patch: CinemaBonus.fr. Discussed that the patch would be placed in office and benefits usually last 3 months. Discussed that unintended exposure to capsaicin can cause severe irritation of eyes, mucous membranes, respiratory tract, and skin, but that Qutenza is a local treatment and does not have the systemic side effects of other nerve medications. Discussed that there may be pain, itching, erythema, and decreased sensory function associated with the application of Qutenza. Side effects usually subside within 1 week. A cold pack of analgesic medications can help with these side effects. Blood pressure can also be increased due to pain associated with administration of the patch.  -she cut down on her gabapentin due to her dizziness    Turmeric to reduce inflammation--can be used in cooking or taken as a supplement.  Benefits of turmeric:  -Highly anti-inflammatory  -Increases antioxidants  -Improves memory, attention, brain disease  -Lowers risk of heart disease  -May help prevent cancer  -Decreases pain  -Alleviates depression  -Delays aging and decreases risk of chronic disease  -Consume with black pepper to increase absorption    Turmeric Milk Recipe:  1 cup milk  1 tsp turmeric  1 tsp cinnamon  1 tsp grated ginger  (optional)  Black pepper (boosts the anti-inflammatory properties of turmeric).  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  -she is a good metabolizer of amitriptyline and has tolerated nortrypilline in the past, but without much benefit.    3) CKD: encouraged 6-8 glasses of water per day. She states that she had AKI with Ibuprofen in the past. Discussed checking Cr level next visit if we are considering Meloxicam -Discussed that Nucynta, and other pain medications, may stay in her bloodstream longer given her CKD   4) Hypernatremia: encouraged hydration   5) General health and pain: provided with list of healthy foods that are both nutritious and fight pain.   6) Depressed: Amitriptyline did help with this but she could not tolerate the drowsiness. Encouraged focusing on the factors that she can control, such as anti-inflammatory diet.   7) Fibromyalgia: Discussed goal to wean steroids prior to surgery. Continue '1mg'$  daily. She is on that intermittently. She started this again about a month ago. She has been on this about a year. Continue Savella -discussed benefits of cold water showers.   8) Dizziness: -discussed that this could be from the combination of gabapentin and baclofen.  -discussed that all the medications we are trying for pain may worsen the dizziness  9) Constipation:  -Provided list of  following foods that help with constipation and highlighted a few: 1) prunes- contain high amounts of fiber.  2) apples- has a form of dietary fiber called pectin that accelerates stool movement and increases beneficial gut bacteria 3) pears- in addition to fiber, also high in fructose and sorbitol which have laxative effect 4) figs- contain an enzyme ficin which helps to speed colonic transit 5) kiwis- contain an enzyme actinidin that improves gut motility and reduces constipation 6) oranges- rich in pectin (like apples) 7) grapefruits- contain a flavanol naringenin which has a laxative effect 8) vegetables- rich in fiber and also great sources of folate, vitamin C, and K 9) artichoke- high in inulin, prebiotic great for the microbiome 10) chicory- increases stool frequency and softness (can be added to coffee) 11) rhubarb- laxative effect 12) sweet potato- high fiber 13) beans, peas, and lentils- contain both soluble and insoluble fiber 14) chia seeds- improves intestinal health and gut flora 15) flaxseeds- laxative effect 16) whole grain rye bread- high in fiber 17) oat bran- high in soluble and insoluble fiber 18) kefir- softens stools -recommended to try at least one of these foods every day.  -drink 6-8 glasses of water per day -walk regularly, especially after meals.   10) Osteopenia - was tapered off the prednisone -discussed that currently her pain may be more of a risk to her than osteopenia given her suicidal ideation from her pain -discussed risk of fall with the medications that make her dizziness.   11) Daytime sleepiness -discussed that this is secondary to her medications.  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

## 2022-02-24 NOTE — Addendum Note (Signed)
Addended by: Izora Ribas on: 02/24/2022 04:10 PM   Modules accepted: Orders

## 2022-02-25 ENCOUNTER — Encounter: Payer: Self-pay | Admitting: Physical Medicine and Rehabilitation

## 2022-02-25 DIAGNOSIS — M797 Fibromyalgia: Secondary | ICD-10-CM | POA: Diagnosis not present

## 2022-02-25 DIAGNOSIS — M25562 Pain in left knee: Secondary | ICD-10-CM | POA: Diagnosis not present

## 2022-02-25 DIAGNOSIS — M353 Polymyalgia rheumatica: Secondary | ICD-10-CM | POA: Diagnosis not present

## 2022-02-25 DIAGNOSIS — M150 Primary generalized (osteo)arthritis: Secondary | ICD-10-CM | POA: Diagnosis not present

## 2022-02-25 DIAGNOSIS — M810 Age-related osteoporosis without current pathological fracture: Secondary | ICD-10-CM | POA: Diagnosis not present

## 2022-02-25 DIAGNOSIS — M25559 Pain in unspecified hip: Secondary | ICD-10-CM | POA: Diagnosis not present

## 2022-02-25 DIAGNOSIS — M170 Bilateral primary osteoarthritis of knee: Secondary | ICD-10-CM | POA: Diagnosis not present

## 2022-03-05 DIAGNOSIS — I10 Essential (primary) hypertension: Secondary | ICD-10-CM | POA: Diagnosis not present

## 2022-03-05 DIAGNOSIS — M810 Age-related osteoporosis without current pathological fracture: Secondary | ICD-10-CM | POA: Diagnosis not present

## 2022-03-05 DIAGNOSIS — E039 Hypothyroidism, unspecified: Secondary | ICD-10-CM | POA: Diagnosis not present

## 2022-03-12 DIAGNOSIS — M810 Age-related osteoporosis without current pathological fracture: Secondary | ICD-10-CM | POA: Diagnosis not present

## 2022-03-12 DIAGNOSIS — F339 Major depressive disorder, recurrent, unspecified: Secondary | ICD-10-CM | POA: Diagnosis not present

## 2022-03-12 DIAGNOSIS — Z23 Encounter for immunization: Secondary | ICD-10-CM | POA: Diagnosis not present

## 2022-03-12 DIAGNOSIS — J309 Allergic rhinitis, unspecified: Secondary | ICD-10-CM | POA: Diagnosis not present

## 2022-03-12 DIAGNOSIS — G500 Trigeminal neuralgia: Secondary | ICD-10-CM | POA: Diagnosis not present

## 2022-03-12 DIAGNOSIS — R82998 Other abnormal findings in urine: Secondary | ICD-10-CM | POA: Diagnosis not present

## 2022-03-12 DIAGNOSIS — M353 Polymyalgia rheumatica: Secondary | ICD-10-CM | POA: Diagnosis not present

## 2022-03-12 DIAGNOSIS — G43009 Migraine without aura, not intractable, without status migrainosus: Secondary | ICD-10-CM | POA: Diagnosis not present

## 2022-03-12 DIAGNOSIS — N393 Stress incontinence (female) (male): Secondary | ICD-10-CM | POA: Diagnosis not present

## 2022-03-12 DIAGNOSIS — Z0000 Encounter for general adult medical examination without abnormal findings: Secondary | ICD-10-CM | POA: Diagnosis not present

## 2022-03-12 DIAGNOSIS — I447 Left bundle-branch block, unspecified: Secondary | ICD-10-CM | POA: Diagnosis not present

## 2022-03-12 DIAGNOSIS — I10 Essential (primary) hypertension: Secondary | ICD-10-CM | POA: Diagnosis not present

## 2022-03-12 DIAGNOSIS — E039 Hypothyroidism, unspecified: Secondary | ICD-10-CM | POA: Diagnosis not present

## 2022-03-12 DIAGNOSIS — K219 Gastro-esophageal reflux disease without esophagitis: Secondary | ICD-10-CM | POA: Diagnosis not present

## 2022-03-16 ENCOUNTER — Other Ambulatory Visit (HOSPITAL_BASED_OUTPATIENT_CLINIC_OR_DEPARTMENT_OTHER): Payer: Self-pay | Admitting: Internal Medicine

## 2022-03-16 ENCOUNTER — Other Ambulatory Visit: Payer: Self-pay | Admitting: Internal Medicine

## 2022-03-16 DIAGNOSIS — Z23 Encounter for immunization: Secondary | ICD-10-CM | POA: Diagnosis not present

## 2022-03-16 DIAGNOSIS — I10 Essential (primary) hypertension: Secondary | ICD-10-CM

## 2022-03-17 ENCOUNTER — Encounter (HOSPITAL_BASED_OUTPATIENT_CLINIC_OR_DEPARTMENT_OTHER): Payer: Medicare Other | Admitting: Physical Medicine and Rehabilitation

## 2022-03-17 DIAGNOSIS — G500 Trigeminal neuralgia: Secondary | ICD-10-CM

## 2022-03-18 MED ORDER — FENTANYL 12 MCG/HR TD PT72: 1.0000 | MEDICATED_PATCH | TRANSDERMAL | 0 refills | Status: DC

## 2022-03-18 NOTE — Progress Notes (Signed)
Subjective:    Patient ID: [name], female    DOB: [birth year], 81 y.o.   MRN: [id number]  HPI  An audio/video tele-health visit is felt to be the most appropriate encounter for this patient at this time. This is a follow up tele-visit via phone. The patient is at home. MD is at office. Prior to scheduling this appointment, our staff discussed the limitations of evaluation and management by telemedicine and the availability of in-person appointments. The patient expressed understanding and agreed to proceed.   [name] presents today for f/u of severe trigeminal neuralgia.   1) Severe trigeminal neuralgia: -she is still not doing well. -she slept almost day today and did not want to wake up for supper which was rare for her -she is having a difficult time weaning off the hydrocodone.  -she has been off of it in two days -she is expecting a return call from Dr. Billey Chang team soon to discuss wean off fentanyl -drove up on Tuesday and on Wednesday was supposed to have an appointment with pain psychologist. When they got there they were told that appointment had been changed to virtual. The next day they had a more sophisticated MRI. [name] did moving in MRI so MRI was not as helpful. The next day they saw Dr. Lebron Quam and discussed several procedural options and then went back to the motor stimulator- that is scheduled on October 16th.  -she recently went on vacation and had to spend the majority of the vacation in bed due to the severity of her pain -she stopped using the intranasal ketamine because although it helped initially, it appeared to make her pain worse later -she asks if she can wean off some medication as she had a recent appointment with Dr. Lebron Quam and was told she may have a lot of postoperative pain if she is taking so much pain medication. She would still like to do this and also experiences sedation from her medication. She asks how she would go about weaning her  medications -she asks if she should wean off the gabapentin since this makes her so sleepy She asks if she should restart Topamax. -she was encouraged by her phone visit with Dr. Lebron Quam and plans to pursue surgery. She is going to [facility] next week for Brain MRI, neuropsych eval, and to discuss the risks and benefits of the procedure with Dr. Lebron Quam -pain was so severe in the past that she expressed suicidality to her husband [name].  -she feels that none of her medications are helpful but she knows that when she tries to wean them her pain worsens.  -she asks about refill of the compounding cream -her husband asks about referral to ketamine clinic for depression rather than pain -she went back to the dentist because it was urting between two teeth and he said there is nothing wrong with those teeth and it is the trigeminal nerve When she took a shower that usually seems to help with but was migrating around her face.  -appointment with neurology in July -Dr. Lebron Quam cancelled the zoom call in [name] but this was rescheduled to July.  -she finds that only the gabapentin helps -she is not sure if the fentanyl helps When she is feeling really bad she tries to take another one but she gets very sleepy and dizzy.  -she is taking 8 a day of '100mg'$  per day- she takes two gabapentin and tylenol and hydrocodone in the morning. She takes another two at  breakfast around 11, another 2 at lunch around 1:30pm.  -she had a recent root canal and pain has become much more severe after that -her pain is in the two teeth next to where she had the root canal -Oragel heps for a bout an hour.  -she tried increasing her Gabapentin to '300mg'$  TID but she could not tolerate this. It made her too sleepy.  -she contacted Dr. Davy Pique and he recommended she pause wean of Fentanyl patch and I agree with this -she took last patch last night. She still has 12.165mg patch available -her husband asks whether she should take a higher  dose of gabapentin -the fentanyl patch makes her very sleepy and so allows her to sleep. It has also made her dizzier.  -the 583m fentanyl patch did not provide more relief- she would like to wean off. Weaned to 25, then 12.65m72m then off this week -[facility] not to Dr. WolJuel Burrowe states and they need a new referral send to Dr. LieVan Clineshe is using the compound cream and this helps- she uses this twice per day -capsaicin made it burn  -lamotrigine makes her too sleepy and dizzy to come down the stairs by herself and she does not feel comfortable going to the shower -left eye more open than usual. -she does not feel she is getting relief from the fentanyl patch -she feels that the procedure at [facility] is a last resort.  -Given darkening of skin and worsening eye lid closure, MRI brain stat obtained and shows new cyst that could be post-surgical. Recommended she go to ED given worsening dizziness and facial swelling as well. ED physician discussed case with NSGY and neurology and discharged home recommending follow-up with Dr. EicGean Quintd Dr. OstMerceda Elkshe was given IV Fentanyl in the ED which helped and discharged on 24m52mentanyl patch, which has not helped.  -discussed increasing Fentanyl patch to 265mc72mnce she has not experienced any negative side effects, no respiratory depression, and her husband is agreeable. I have sent in new script to start Saturday, but insurance required prior auth, which was approved.  -tonight would be the third tay of the 265mcg66mtanyl patch -she is still taking the Gabapentin -she feels that when she lays down she is spinning.  -eye continues to appear more closed.  -Husband has called Dr. EichmaDionne Anor. OstegaEarlie Ravelinge to scheduled follow-up appointments and has appointments with both tomorrow.  -Will run out of Fentanyl patch by Sunday -pain is worsening and nothing is providing relief -she asks whether she  should go to ED -she has also noted dizziness and is not sure if this is from being in the MRI or from the SavellDumasrphine was not effective and caused itching, sarna lotion did help wit this.  -hydrocodone has been most effective thus far -nucynta was not effective but she is not sure whether she gave it a fair trial -she has month appointments scheduled with Kristina Ball/u with me in Feb -she used the Norco last night and this morning with good benefit and has started to feel the pain return this afternoon -she did find benefit from the compound cream- she continues to use this.  -She continues to have a sharp shooting pain doing around her eyelid and the tip teeth and in the top of her head.  -has a radiofrequency ablation treatment that helped but not completely -she is not having as much sharp knife-like pin like  in her eye Her top teeth in her left law have been hurting really badly.  -she now takes the baclofen with the gabapentin and they seem to work well together.  -she had surgical procedure and feels worse -yesterday she took a half a tablet but this morning she took a whole one. She does fine with the hydrocodone but a whole one makes her too sleepy.  -she is taking 2 '100mg'$  Gabapentin three times per day but it makes her dizzy and sleepy so she cannot funciton -the cold worsens her pain.  -The IV fentanyl did help with the pain.  -her husband feels she should get one more dose of the '25mg'$  fentanyl. -she did get one week benefit from the steroid injection.  -she is doing terribly -she is miserable -she feels depressed due to the pain -she has considered taking the whole bottle of hydrocodone because because she can't take this pain anymore.  -she started taking the Gabapentin '300mg'$  TID.  -she does find relief from the hydrocodone -the pain is constant -she wonders if it is inflammation.  -she has not tried turmeric. Her son-in-law gave her a bottle and she couldn't stomach  it.   2) Fibromyalgia: -She has been almost nonfunctional due to the severity of her pain. It continues to be very severe.  -She would like to wean off the Gabapentin as she feels at risk of falls -she is taking Topamax BID. She felt this was helpful but made her unsteady on her feet.  -still using gabapentin and this is helping.   3) Osteoporosis: -husband asks whether she should get a Prolia shot.   4) Dizziness: -head was swimming -she felt like she was going to fall.  -worsening -feels like she is spinning, not the room  -She tried the Phenytoin for 2 days and did not feel benefit so stopped it.   -she feels the gabapentin aggravates this and she would love to wean off but knows it helps her.   -She feels very sleepy during the day.   -She has never tried Topirmate before.   5) Sore throat -developed stopping lamotrigine since not helping.   Prior history:  Her pain continues to be in her left eye, forehead. She has not tried any topicals for pain. She uses capsaicin for her back and knees.   Since last visit nothing we have tried has helped with her pain. She has tried to decrease her Gabapentin dose but then her pain increased so she has resumed taking 4 Gabapentin per day.   Her husband asks whether he should contact her surgeon regarding whether nerve may have become unclamped.   Discussed prolotherapy, trigger point injections, Baclofen, Clonazepam, Phenytoin as other options we could try,=.  Pain continues to be excruciating and she states she is not sure how much longer she can live like this.   Prior history: She has having some nausea, lightheadedness and felt this may be related to her Gabapentin. She did not take any yesterday and then felt more sever pain in the evening. She also gets electrical shooting across her jaw.   She does not feel that she has cluster headaches as the pain has been so constant. She feels that she is getting worse. The oxygen made her  feel relaxed but it is not helping.   The Sarna lotion does help with the itching in her head.   She was willing to try the Amitriptyline but it makes her too sleepy in the  morning.   Her teeth and left eye incredibly hurt.   She feels that the Gabapentin has been making her dizziness.   She asks about biofeedback.   The pain is 7/10 on average, similar to last time.   When the pain is severe it can make her depressed.   Pain Inventory Pain Right Now 10 My pain is constant, sharp, burning, stabbing, and aching  In the last 24 hours, has pain interfered with the following? General activity 10 Relation with others 8 Enjoyment of life 10 What TIME of day is your pain at its worst? morning  and evening Sleep (in general) Good  Pain is worse with: bending and standing Pain improves with: rest, heat/ice, and medication Relief from Meds: 2  Family History  Problem Relation Age of Onset   Heart disease Mother    Heart disease Father    Stroke Father    Stroke Sister    Asthma Daughter    Colon cancer Neg Hx    Social History   Socioeconomic History   Marital status: Married    Spouse name: Not on file   Number of children: 2   Years of education: college   Highest education level: Not on file  Occupational History   Occupation: housewife    Employer: RETIRED  Tobacco Use   Smoking status: Never   Smokeless tobacco: Never  Vaping Use   Vaping Use: Never used  Substance and Sexual Activity   Alcohol use: No    Alcohol/week: 0.0 standard drinks of alcohol   Drug use: No   Sexual activity: Not on file  Other Topics Concern   Not on file  Social History Narrative   1 cup of Jasmine tea daily    Social Determinants of Health   Financial Resource Strain: Not on file  Food Insecurity: Not on file  Transportation Needs: Not on file  Physical Activity: Not on file  Stress: Not on file  Social Connections: Not on file   Past Surgical History:  Procedure  Laterality Date   CHOLECYSTECTOMY  03/2010   COLONOSCOPY  2009   Dr. Raford Pitcher    CYSTOSCOPY     Multiple Cystoscopies with stents or biopsy    ESOPHAGOGASTRODUODENOSCOPY  10/2013   EYE SURGERY     bilateral cataract removal   Gamma knife radiation  01/2018   for trigeminal neuralgia   REFRACTIVE SURGERY Bilateral 2022   RHIZOTOMY Left 04/28/2021   Procedure: Left Percutaneous trigeminal balloon rhizotomy;  Surgeon: Judith Part, MD;  Location: [facility];  Service: Neurosurgery;  Laterality: Left;   TONSILLECTOMY     Past Surgical History:  Procedure Laterality Date   CHOLECYSTECTOMY  03/2010   COLONOSCOPY  2009   Dr. Raford Pitcher    CYSTOSCOPY     Multiple Cystoscopies with stents or biopsy    ESOPHAGOGASTRODUODENOSCOPY  10/2013   EYE SURGERY     bilateral cataract removal   Gamma knife radiation  01/2018   for trigeminal neuralgia   REFRACTIVE SURGERY Bilateral 2022   RHIZOTOMY Left 04/28/2021   Procedure: Left Percutaneous trigeminal balloon rhizotomy;  Surgeon: Judith Part, MD;  Location: [facility];  Service: Neurosurgery;  Laterality: Left;   TONSILLECTOMY     Past Medical History:  Diagnosis Date   Adenomatous colon polyp 03/24/2018   Allergy    Arthritis    Cataract    Chronic headaches    Depression    Fibromyalgia    Gallstones  GERD (gastroesophageal reflux disease)    Hyperlipemia    Hypertension    Interstitial cystitis    Lactose intolerance    LBBB (left bundle branch block)    Osteoarthritis    Osteopenia    Pneumonia    Thyroid disease    Tinnitus    Trigeminal neuralgia    There were no vitals taken for this visit.  Opioid Risk Score:   Fall Risk Score:  `1  Depression screen PHQ 2/9     02/03/2022    1:29 PM 12/25/2021    2:01 PM 12/24/2021    1:11 PM 11/27/2021   10:09 AM 10/20/2021    1:08 PM 10/20/2021   11:16 AM 09/23/2021    1:56 PM  Depression screen PHQ 2/9  Decreased Interest 0 0 1 0 1 0 0  Down, Depressed, Hopeless  0 3 1 0 '1 3 1  '$ PHQ - 2 Score 0 3 2 0 '2 3 1  '$ Altered sleeping  0    1   Tired, decreased energy  0    2   Change in appetite  0    0   Feeling bad or failure about yourself   0    0   Trouble concentrating  0    0   Moving slowly or fidgety/restless  0    0   Suicidal thoughts  0    0   PHQ-9 Score  3    6   Difficult doing work/chores  Somewhat difficult    Somewhat difficult     Review of Systems  Constitutional: Negative.   HENT: Negative.    Eyes:  Positive for visual disturbance.  Respiratory: Negative.    Cardiovascular: Negative.   Gastrointestinal: Negative.   Endocrine: Negative.   Genitourinary: Negative.   Musculoskeletal: Negative.        Pain in left side of face  Skin: Negative.   Allergic/Immunologic: Negative.   Neurological:  Positive for dizziness, tremors, light-headedness and numbness.       Tingling   Hematological: Negative.   Psychiatric/Behavioral: Negative.    All other systems reviewed and are negative.      Objective:   Physical Exam Not performed    Assessment & Plan:  [name] is an 81 year old woman who presents for f/u of left sided trigeminal neuralgia and dizziness.    1) Sensory deafferation pain, left sided trigeminal neuralgia vs cluster headaches (she does have lacrimation and runny nose on that side). She does have a history of migraines.  -Discussed that if she has to be in bed all day due to her pain it may be beneficial for her husband to give her at least one hydrocodone per day to enable her to function -provided refill of Fentanyl patch as she will be leaving for her surgery in [place] soon and her husband plans to stay there for some time in case she has any surgical complications.  -reviewed Dr. Billey Chang note with her, discussed the plan for her visit to [facility] next October.  -ordered MRI Brain with and without contrast, discussed the plan for repeat MRI brain next October -discussed her follow-up with pain  psychology.  -recommended weaning off Fentanyl, discussed stopping fentanyl because she is on 12.'5mg'$  today but she had tried this a couple of weeks ago and pain was so severe that she needed patch to be replaced.  -discussed that she is taking hydrocodone 4 tablets per day, and recommend to  decrease this to 3 tabs per day for three days and then to keep decreasing the tab by once per day.  -discussed that skipping the morning dose of hydrocodone will at least allow her to sleep -recommended trying Frankincense and Myrrh essential oils mixed in coconut oil and applied to area of pain -recommended restarting Topamax -called [facility] to prescribe compounding gel refill -continue Gabapentin '100mg'$  up to 8 times a day, discussed her current schedule of takin -continue Fentanyl patch to 60mg and she is agreeable. Discussed that phenytoin, carbamazepine -discussed using Oragel multiple times per ay since this helps for an hour, use the topical cream intermittently in between -[facility] to set [facility] an appointment for her with them. Unfortunately Dr. LVan Clineshas retired but was able to set her up with Dr. SChristin Bachwho specializes in migraines and trigeminal neuralgia on July 25th. Let patient and husband know. They will mail her their address.  -restart 12.544m fentanyl patch as patient does not feel she can get through weekend with this level of pain -commended her for calling Dr. EiDavy Piques well to let him know how she is feeling -recommended taking 1 hydrocodone now for the severe pain, and discussed that the Fentanyl patch with take 12-24 hours to kick in -prescribed Narcan while on Fentanyl -discussed with [name] the neurology referral and she did specify Dr. Wo[facility]uel [facility]nd [facility] -discussed CBD oil, her son bought her some of this and she felt that it greatly helped  Recommend topical CBD oil- discussed its benefits in  reducing inflammation, pain, insomnia, and anxiety, but she stopped because effect wore of.  -Discussed that CBD oil differs from marijuana in that it does not contain THC- the substance that causes euphoria.  -Discussed that it is made from the hemp plant.  -It has been used for thousands of years -preliminary research suggests that if may be able to shrink cancerous tumors, stop plaque formation in Alzheimer's Disease, and slow the progress of brain disease from concussions.  -Additional benefits that have been demonstrated in studies include improved nausea, indigestion, and brain health, and reduced seizures.  -In a survey 92% of patients who tried medical cannabis felt it improved symptoms such as chronic pain, arthritis, migraines, and cancer.   -Provided with a pain relief journal and discussed that it contains foods and lifestyle tips to naturally help to improve pain. Discussed that these lifestyle strategies are also very good for health unlike some medications which can have negative side effects. Discussed that the act of keeping a journal can be therapeutic and helpful to realize patterns what helps to trigger and alleviate pain.   -discussed with Dr. OsJoaquim Namrigeminal deafferation pain, discussed the difference between this and trigeminal neuralgia with patient and husband, discussed motor cortex stimulation. -discussed whether or not to continue fentanyl patch, husband does feel that the IV fentanyl  -encouraged following with Dr. Sweet/Dr. MiSabra Heckt [facility] to try procedure recommended by D. Ostegard.  -stop lamotrigine as it is making her dizzy without benefit -reviewed patient's medical notes -discussed her progress with getting a referral to [facility] -recommended going to ED given darkening of skin, worsening eye closure, finding of cyst, dizziness to receive prompt neurosurgical eval regarding if surgery is necessary, and also for pain management given worsening pain  refractory to all treatments and resulting suicidality. Reviewed ED doc notes- he discussed with on-call neurology and NSGY and determined that patient could  be discharged with outpatient follow-up. IV fentanyl was administered with relief to patient. She was discharged on fentanyl patch which has not provided relief. She is getting 73mg- no side effects- discussed increasing to 256m and her husband is in agreement. Prior auth completed and approved. Discussed with husband starting patch on Satruday after she has worn 1221mpatch for 72 hours -encouraged follow-up with Dr. OstJoaquim Namusband let me know appointment has been set up 1/18.  -recommended follow-up with Dr. 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  -Discussed that turmeric  -discussed steroid taper. -continue savella, husband feels like it is helping with her suicidal thoughts. Increase to '25mg'$ .  -continue hydrocodone.  -discuss mouth guard with dentist.  -d/c topamax -she does have another procedure scheduled. -discussed benefits of infrared light.  -Continue Norco '5mg'$  TID PRN -follow-up monthly with EunZella Fuller with me in February, placed on waitlist for earlier appointment with me, advised phone visits/MyChart are a great way to communicate in the interim -discussed that she can take an additional one or two tylenol for breakthrough pain -recommended checking CMP for liver enzymes with next labs -prescribed NAC '600mg'$  BID to protect the liver while taking tylenol, discussed its health benefits for mitochondrial function -d/c Baclofen  -f/u with Dr. OstJoaquim Namd Dr. EicDavy Pique18 -discussed retrying carbamazepine to see  if it is effective for her. It is $89 for her, so we discussed trying Lamictal instead.  -encouraged anti-inflammatory diet.  -she had sinusitis that was viral and was treated with antibiotics. This was in the early 90s41she symptoms resolved after 2 months. She uses to get sinus infections and saline rinse.  -She has had multiple root canals.  -Discontinue 100% oxygen via facemask as this was not helpful.  -Discussed that unfortunately Sprint PNS is not indicated for craniofacial pain. -Amitriptyline and Lyrica did not help.  -Continue Cymbalta '60mg'$ .  -Topiramate helped but caused dizziness.  -Provided with hand out with information regarding trigeminal neuralgia -Failed Penytoin.  -trial lidocaine patch -She would like to try biofeedback. -reviewed neurology note.  -referred to Dr. WolJuel Burrow-will try trigger point injections next visit.  -Continue Ketamine 10%, Baclofen 2%, Cyclobenzaprine 2%, Ketoprofen 10%, Gabapentin 6%, Bupivacaine 1%, Amitryptiline 5% to apply to painful areas- using 3-4 times per day.  -Can consider Meloxicam if Phenytoin does not help.  -Discussed Qutenza as an option for neuropathic pain control. Discussed that this is a capsaicin patch, stronger than capsaicin cream. Discussed that it is currently approved for diabetic peripheral neuropathy and post-herpetic neuralgia, but that it has also shown benefit in treating other forms of neuropathy. Provided patient with link to site to learn more about the patch: httCinemaBonus.friscussed that the patch would be placed in office and benefits usually last 3 months. Discussed that unintended exposure to capsaicin can cause severe irritation of eyes, mucous membranes, respiratory tract, and skin, but that Qutenza is a local treatment and does not have the systemic side effects of other nerve medications. Discussed that there may be pain, itching, erythema, and decreased sensory function associated with the  application of Qutenza. Side effects usually subside within 1 week. A cold pack of analgesic medications can help with these side effects. Blood pressure  can also be increased due to pain associated with administration of the patch.  -she cut down on her gabapentin due to her dizziness    Turmeric to reduce inflammation--can be used in cooking or taken as a supplement.  Benefits of turmeric:  -Highly anti-inflammatory  -Increases antioxidants  -Improves memory, attention, brain disease  -Lowers risk of heart disease  -May help prevent cancer  -Decreases pain  -Alleviates depression  -Delays aging and decreases risk of chronic disease  -Consume with black pepper to increase absorption    Turmeric Milk Recipe:  1 cup milk  1 tsp turmeric  1 tsp cinnamon  1 tsp grated ginger (optional)  Black pepper (boosts the anti-inflammatory properties of turmeric).  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  -she is a good metabolizer of amitriptyline and has tolerated nortrypilline in the past, but without much benefit.    3) CKD: encouraged 6-8 glasses of water per day. She states that she had AKI with Ibuprofen in the past. Discussed checking Cr level next visit if we are considering Meloxicam -Discussed that Nucynta, and other pain medications, may stay in her bloodstream longer given her CKD   4)  Hypernatremia: encouraged hydration   5) General health and pain: provided with list of healthy foods that are both nutritious and fight pain.   6) Depressed: Amitriptyline did help with this but she could not tolerate the drowsiness. Encouraged focusing on the factors that she can control, such as anti-inflammatory diet.   7) Fibromyalgia: Discussed goal to wean steroids prior to surgery. Continue '1mg'$  daily. She is on that intermittently. She started this again about a month ago. She has been on this about a year. Continue Savella -discussed benefits of cold water showers.   8) Dizziness: -discussed that this could be from the combination of gabapentin and baclofen.  -discussed that all the medications we are trying for pain may worsen the dizziness  9) Constipation:  -Provided list of following foods that help with constipation and highlighted a few: 1) prunes- contain high amounts of fiber.  2) apples- has a form of dietary fiber called pectin that accelerates stool movement and increases beneficial gut bacteria 3) pears- in addition to fiber, also high in fructose and sorbitol which have laxative effect 4) figs- contain an enzyme ficin which helps to speed colonic transit 5) kiwis- contain an enzyme actinidin that improves gut motility and reduces constipation 6) oranges- rich in pectin (like apples) 7) grapefruits- contain a flavanol naringenin which has a laxative effect 8) vegetables- rich in fiber and also great sources of folate, vitamin C, and K 9) artichoke- high in inulin, prebiotic great for the microbiome 10) chicory- increases stool frequency and softness (can be added to coffee) 11) rhubarb- laxative effect 12) sweet potato- high fiber 13) beans, peas, and lentils- contain both soluble and insoluble fiber 14) chia seeds- improves intestinal health and gut flora 15) flaxseeds- laxative effect 16) whole grain  rye bread- high in fiber 17) oat bran- high in soluble and  insoluble fiber 18) kefir- softens stools -recommended to try at least one of these foods every day.  -drink 6-8 glasses of water per day -walk regularly, especially after meals.   10) Osteopenia - was tapered off the prednisone -discussed that currently her pain may be more of a risk to her than osteopenia given her suicidal ideation from her pain -discussed risk of fall with the medications that make her dizziness.   11) Daytime sleepiness -discussed that this is secondary to her medications.  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

## 2022-03-20 DIAGNOSIS — Z23 Encounter for immunization: Secondary | ICD-10-CM | POA: Diagnosis not present

## 2022-03-24 ENCOUNTER — Ambulatory Visit (HOSPITAL_COMMUNITY)
Admission: RE | Admit: 2022-03-24 | Discharge: 2022-03-24 | Disposition: A | Discharge status: Home / Self Care | Payer: Medicare Other | Source: Ambulatory Visit | Attending: Internal Medicine | Admitting: Internal Medicine

## 2022-03-24 DIAGNOSIS — I10 Essential (primary) hypertension: Secondary | ICD-10-CM | POA: Insufficient documentation

## 2022-03-25 DIAGNOSIS — I2510 Atherosclerotic heart disease of native coronary artery without angina pectoris: Secondary | ICD-10-CM | POA: Diagnosis not present

## 2022-03-25 DIAGNOSIS — M150 Primary generalized (osteo)arthritis: Secondary | ICD-10-CM | POA: Diagnosis not present

## 2022-03-25 DIAGNOSIS — M791 Myalgia, unspecified site: Secondary | ICD-10-CM | POA: Diagnosis not present

## 2022-03-26 ENCOUNTER — Ambulatory Visit
Admission: RE | Admit: 2022-03-26 | Discharge: 2022-03-26 | Disposition: A | Discharge status: Home / Self Care | Payer: Medicare Other | Source: Ambulatory Visit | Attending: Sports Medicine | Admitting: Sports Medicine

## 2022-03-26 ENCOUNTER — Ambulatory Visit (INDEPENDENT_AMBULATORY_CARE_PROVIDER_SITE_OTHER): Payer: Medicare Other | Admitting: Sports Medicine

## 2022-03-26 VITALS — BP 136/62 | Ht 63.0 in | Wt 150.0 lb

## 2022-03-26 DIAGNOSIS — M25562 Pain in left knee: Secondary | ICD-10-CM | POA: Diagnosis not present

## 2022-03-26 DIAGNOSIS — M170 Bilateral primary osteoarthritis of knee: Secondary | ICD-10-CM | POA: Diagnosis not present

## 2022-03-26 DIAGNOSIS — M25561 Pain in right knee: Secondary | ICD-10-CM

## 2022-03-26 DIAGNOSIS — M797 Fibromyalgia: Secondary | ICD-10-CM | POA: Diagnosis not present

## 2022-03-26 DIAGNOSIS — M25559 Pain in unspecified hip: Secondary | ICD-10-CM | POA: Diagnosis not present

## 2022-03-26 DIAGNOSIS — M810 Age-related osteoporosis without current pathological fracture: Secondary | ICD-10-CM | POA: Diagnosis not present

## 2022-03-26 DIAGNOSIS — M150 Primary generalized (osteo)arthritis: Secondary | ICD-10-CM | POA: Diagnosis not present

## 2022-03-26 DIAGNOSIS — M1711 Unilateral primary osteoarthritis, right knee: Secondary | ICD-10-CM | POA: Diagnosis not present

## 2022-03-26 DIAGNOSIS — M353 Polymyalgia rheumatica: Secondary | ICD-10-CM | POA: Diagnosis not present

## 2022-03-26 DIAGNOSIS — M1712 Unilateral primary osteoarthritis, left knee: Secondary | ICD-10-CM | POA: Diagnosis not present

## 2022-03-26 NOTE — Progress Notes (Addendum)
    SUBJECTIVE:   CHIEF COMPLAINT / HPI:   Patient is an 81 year old female with history of polyarthritis rheumatica and bilateral knee osteoarthritis presents today for worsening bilateral knee pain. Patient reports she has being on steroid treatment for her polyarthritis rheumatica and it has helped her knee pain. Her prednisone treatment was completed September 30th and that was when she started noticing worsening of her knee pain. Left knee pain is worse than the right.  She reports occasional swelling of the knees. She sees Dr. Marlou Sa who gives gel knee injections every 6 months, last injection was 2 months ago. He has also aspirated the knee in the past. Describes knee pain as sharp pain that's the the same through out the day and sometimes goes down her leg.   PERTINENT  PMH / PSH: Reviewed  OBJECTIVE:   BP 136/62   Ht '5\' 3"'$  (1.6 m)   Wt 150 lb (68 kg)   BMI 26.57 kg/m     Physical Exam General: Alert, well appearing, NAD Extremities -Left LE with notable Valgus deformity however no erythema, warmth or edema of the knees bilaterally -Significant tenderness to palpation on the lateral line of the knees bilaterally -Negative Valgus, Varus Test. Posterior and anterior drawer test was negative - Muscle strength is 4/5 of the right knee and 3/5 on the left    ASSESSMENT/PLAN:   Patient's bilateral knee pain is consistent with osteoarthritis of the knee.  She will likely benefit from knee strengthening exercises.  She will need formal PT however given her upcoming Motor Cortex Stimulation [facility])  procedure for trigeminal neuralgia which takes priority at this time. Patient will call in to follow up after her procedure and recovery time for PT referral. Given it's been over 10 years since her last knee imaging, will obtain bilateral knee x-ray to assess severity of her osteoarthritis.     This note was dictated using Dragon naturally speaking software and may contain errors in  syntax, spelling, or content which have not been identified prior to signing this note.    Alen Bleacher, MD [place]   Patient seen and evaluated with the resident.  I agree with the above plan of care.  Cadyn would benefit greatly from physical therapy for generalized lower extremity strengthening but she has a surgical procedure planned for [facility] very soon.  I have asked her to contact me once she returns to [facility] and can begin physical therapy.  In the meantime, I would like to get x-rays of her knees to evaluate the degree of osteoarthritis present.  She has been treated by Dr. Marlou Sa in the past and most recently had a round of viscosupplementation 2 months ago.  Addendum: X-rays reviewed on March 30, 2022.  There is mild osteoarthritis in both knees.  Not severe.  I recommend physical therapy when she returns to [place].  She should also continue under the care of Dr. Marlou Sa.

## 2022-03-30 ENCOUNTER — Encounter: Payer: Self-pay | Admitting: Sports Medicine

## 2022-03-31 DIAGNOSIS — G939 Disorder of brain, unspecified: Secondary | ICD-10-CM | POA: Diagnosis not present

## 2022-03-31 DIAGNOSIS — I6621 Occlusion and stenosis of right posterior cerebral artery: Secondary | ICD-10-CM | POA: Diagnosis not present

## 2022-03-31 DIAGNOSIS — M792 Neuralgia and neuritis, unspecified: Secondary | ICD-10-CM | POA: Diagnosis not present

## 2022-03-31 DIAGNOSIS — G500 Trigeminal neuralgia: Secondary | ICD-10-CM | POA: Diagnosis not present

## 2022-03-31 DIAGNOSIS — I447 Left bundle-branch block, unspecified: Secondary | ICD-10-CM | POA: Diagnosis not present

## 2022-03-31 DIAGNOSIS — G509 Disorder of trigeminal nerve, unspecified: Secondary | ICD-10-CM | POA: Diagnosis not present

## 2022-03-31 DIAGNOSIS — Z01818 Encounter for other preprocedural examination: Secondary | ICD-10-CM | POA: Diagnosis not present

## 2022-04-06 DIAGNOSIS — Z888 Allergy status to other drugs, medicaments and biological substances status: Secondary | ICD-10-CM | POA: Diagnosis not present

## 2022-04-06 DIAGNOSIS — G509 Disorder of trigeminal nerve, unspecified: Secondary | ICD-10-CM | POA: Diagnosis not present

## 2022-04-06 DIAGNOSIS — K219 Gastro-esophageal reflux disease without esophagitis: Secondary | ICD-10-CM | POA: Diagnosis not present

## 2022-04-06 DIAGNOSIS — G500 Trigeminal neuralgia: Secondary | ICD-10-CM | POA: Diagnosis not present

## 2022-04-06 DIAGNOSIS — E785 Hyperlipidemia, unspecified: Secondary | ICD-10-CM | POA: Diagnosis not present

## 2022-04-06 DIAGNOSIS — Z9089 Acquired absence of other organs: Secondary | ICD-10-CM | POA: Diagnosis not present

## 2022-04-06 DIAGNOSIS — I447 Left bundle-branch block, unspecified: Secondary | ICD-10-CM | POA: Diagnosis not present

## 2022-04-06 DIAGNOSIS — I129 Hypertensive chronic kidney disease with stage 1 through stage 4 chronic kidney disease, or unspecified chronic kidney disease: Secondary | ICD-10-CM | POA: Diagnosis not present

## 2022-04-06 DIAGNOSIS — M810 Age-related osteoporosis without current pathological fracture: Secondary | ICD-10-CM | POA: Diagnosis not present

## 2022-04-06 DIAGNOSIS — M1990 Unspecified osteoarthritis, unspecified site: Secondary | ICD-10-CM | POA: Diagnosis not present

## 2022-04-06 DIAGNOSIS — Z9889 Other specified postprocedural states: Secondary | ICD-10-CM | POA: Diagnosis not present

## 2022-04-06 DIAGNOSIS — E039 Hypothyroidism, unspecified: Secondary | ICD-10-CM | POA: Diagnosis not present

## 2022-04-06 DIAGNOSIS — Z886 Allergy status to analgesic agent status: Secondary | ICD-10-CM | POA: Diagnosis not present

## 2022-04-06 DIAGNOSIS — F32A Depression, unspecified: Secondary | ICD-10-CM | POA: Diagnosis not present

## 2022-04-06 DIAGNOSIS — R52 Pain, unspecified: Secondary | ICD-10-CM | POA: Diagnosis not present

## 2022-04-06 DIAGNOSIS — Z79890 Hormone replacement therapy (postmenopausal): Secondary | ICD-10-CM | POA: Diagnosis not present

## 2022-04-06 DIAGNOSIS — Z9049 Acquired absence of other specified parts of digestive tract: Secondary | ICD-10-CM | POA: Diagnosis not present

## 2022-04-06 DIAGNOSIS — J9811 Atelectasis: Secondary | ICD-10-CM | POA: Diagnosis not present

## 2022-04-06 DIAGNOSIS — J90 Pleural effusion, not elsewhere classified: Secondary | ICD-10-CM | POA: Diagnosis not present

## 2022-04-06 DIAGNOSIS — Z885 Allergy status to narcotic agent status: Secondary | ICD-10-CM | POA: Diagnosis not present

## 2022-04-06 DIAGNOSIS — M797 Fibromyalgia: Secondary | ICD-10-CM | POA: Diagnosis not present

## 2022-04-06 DIAGNOSIS — Z4542 Encounter for adjustment and management of neuropacemaker (brain) (peripheral nerve) (spinal cord): Secondary | ICD-10-CM | POA: Diagnosis not present

## 2022-04-06 DIAGNOSIS — H903 Sensorineural hearing loss, bilateral: Secondary | ICD-10-CM | POA: Diagnosis not present

## 2022-04-06 DIAGNOSIS — R42 Dizziness and giddiness: Secondary | ICD-10-CM | POA: Diagnosis not present

## 2022-04-06 DIAGNOSIS — J811 Chronic pulmonary edema: Secondary | ICD-10-CM | POA: Diagnosis not present

## 2022-04-06 DIAGNOSIS — Z881 Allergy status to other antibiotic agents status: Secondary | ICD-10-CM | POA: Diagnosis not present

## 2022-04-06 DIAGNOSIS — M5136 Other intervertebral disc degeneration, lumbar region: Secondary | ICD-10-CM | POA: Diagnosis not present

## 2022-04-07 ENCOUNTER — Ambulatory Visit: Payer: Medicare Other | Admitting: Physical Medicine and Rehabilitation

## 2022-04-08 DIAGNOSIS — I447 Left bundle-branch block, unspecified: Secondary | ICD-10-CM | POA: Diagnosis not present

## 2022-04-10 DIAGNOSIS — R52 Pain, unspecified: Secondary | ICD-10-CM | POA: Diagnosis not present

## 2022-04-11 DIAGNOSIS — R52 Pain, unspecified: Secondary | ICD-10-CM | POA: Diagnosis not present

## 2022-04-15 DIAGNOSIS — R52 Pain, unspecified: Secondary | ICD-10-CM | POA: Diagnosis not present

## 2022-04-17 DIAGNOSIS — I447 Left bundle-branch block, unspecified: Secondary | ICD-10-CM | POA: Diagnosis not present

## 2022-04-17 DIAGNOSIS — R239 Unspecified skin changes: Secondary | ICD-10-CM | POA: Diagnosis not present

## 2022-04-17 DIAGNOSIS — Z9682 Presence of neurostimulator: Secondary | ICD-10-CM | POA: Diagnosis not present

## 2022-04-17 DIAGNOSIS — G500 Trigeminal neuralgia: Secondary | ICD-10-CM | POA: Diagnosis not present

## 2022-04-17 DIAGNOSIS — Z01810 Encounter for preprocedural cardiovascular examination: Secondary | ICD-10-CM | POA: Diagnosis not present

## 2022-04-17 DIAGNOSIS — G509 Disorder of trigeminal nerve, unspecified: Secondary | ICD-10-CM | POA: Diagnosis not present

## 2022-04-21 DIAGNOSIS — G500 Trigeminal neuralgia: Secondary | ICD-10-CM | POA: Diagnosis not present

## 2022-04-21 DIAGNOSIS — G509 Disorder of trigeminal nerve, unspecified: Secondary | ICD-10-CM | POA: Diagnosis not present

## 2022-04-21 DIAGNOSIS — Z9682 Presence of neurostimulator: Secondary | ICD-10-CM | POA: Diagnosis not present

## 2022-05-05 DIAGNOSIS — R197 Diarrhea, unspecified: Secondary | ICD-10-CM | POA: Diagnosis not present

## 2022-05-06 DIAGNOSIS — G500 Trigeminal neuralgia: Secondary | ICD-10-CM | POA: Diagnosis not present

## 2022-05-06 DIAGNOSIS — R197 Diarrhea, unspecified: Secondary | ICD-10-CM | POA: Diagnosis not present

## 2022-05-08 ENCOUNTER — Encounter: Payer: Self-pay | Admitting: Physical Medicine and Rehabilitation

## 2022-05-12 ENCOUNTER — Ambulatory Visit (INDEPENDENT_AMBULATORY_CARE_PROVIDER_SITE_OTHER): Payer: Medicare Other | Admitting: Sports Medicine

## 2022-05-12 VITALS — BP 126/82 | Ht 63.0 in | Wt 138.0 lb

## 2022-05-12 DIAGNOSIS — M25562 Pain in left knee: Secondary | ICD-10-CM

## 2022-05-12 DIAGNOSIS — G8929 Other chronic pain: Secondary | ICD-10-CM | POA: Diagnosis not present

## 2022-05-12 NOTE — Progress Notes (Addendum)
SUBJECTIVE:   Chief Complaint: left knee pain  History of Presenting Illness:   [name] is an 81 year old female with a past medical history of bilateral knee OA L>R, lumbar radiculopathy, trigeminal neuralgia, and polymyalgia rheumatica who presents with left knee pain.  States that her left knee started hurting immediately after the first of two, 10-16 and 10-23, motor cortex stimulator implantations. Both surgeries lasted for about three hours and patient was lying in left lateral decubitus position for duration of these procedures. She describes the pain as sharp and located on the lateral aspect of her left knee. It occurs intermittently during certain movements including flexion of left knee after long periods of rest and lateral movements of her left high. She also experiences the pain when ascending stairs, but only during the first step or two. The pain manifests abruptly after the provoking movement and then quickly dissipates. Her knee pain is also associated with a feeling of knee joint instability, almost as if her knee cap is moving toward her lateral side.  Denies numbness or tingling from hip to toes and any radiation of the pain. She has been taking acetaminophen and gabapentin for trigeminal neuralgia, but has not taken any medications specifically for her knee pain. Denies trying any other interventions including ice or heat therapy and physical therapy. Of note, the patient's neurosurgery team has advised against physical activity and activities that involve full neck flexion and extension, such as bending over, for 2-3 months post-surgery.    Pertinent Medical History:   Past Medical History:  Diagnosis Date   Adenomatous colon polyp 03/24/2018   Allergy    Arthritis    Cataract    Chronic headaches    Depression    Fibromyalgia    Gallstones    GERD (gastroesophageal reflux disease)    Hyperlipemia    Hypertension    Interstitial cystitis    Lactose intolerance     LBBB (left bundle branch block)    Osteoarthritis    Osteopenia    Pneumonia    Thyroid disease    Tinnitus    Trigeminal neuralgia       OBJECTIVE:   BP 126/82   Ht '5\' 3"'$  (1.6 m)   Wt 138 lb (62.6 kg)   BMI 24.45 kg/m   Physical Examination:  LLE - valgus deformity, mild swelling surrounding knee joint, mild tenderness to palpation on lateral aspect of knee above joint line BLEs: sensation to light touch intact across dermatomes L3-S1, strength 5 /5 with foot flexion-extension knee flexion-extension and hip abduction-adduction bilaterally, hip flexion 5 /5 on R and 5- /5 on L    ASSESSMENT/PLAN:   Patient's presentation is most consistent with knee osteoarthritis, which has been demonstrated via knee radiograph performed on 10-5. Recent surgeries likely exacerbated symptoms due to malalignment during these procedures.  > Encourage mobility to prevent further post-operative atrophy of muscles stabilizing knee joint, however, postpone any targeted strengthening exercises including physical therapy until 06-2022 per neurosurgery recommendations > Continue to take naltrexone while monitoring for symptomatic improvement of knee pain specifically > Consider corticosteroid injection if pain persists    There are no diagnoses linked to this encounter.  No follow-ups on file.  Roswell Nickel, MD  05/12/2022, 2:32 PM   Patient seen and evaluated with the resident.  I agree with the above plan of care.  [name] has known DJD in his left knee.  She has been instructed by her neurosurgeon to limit physical activity for the  next couple of months but I believe that may result in worsening weakness of the left knee.  She does describe instability so we will try a double upright brace.  She recently started naltrexone for an unrelated issue and would like to see if that helps with her knee pain.  I did discuss the possibility of a cortisone injection if symptoms do not begin to improve  over the next week or two.  Follow-up as needed.  This note was dictated using Dragon naturally speaking software and may contain errors in syntax, spelling, or content which have not been identified prior to signing this note.

## 2022-05-19 ENCOUNTER — Encounter: Payer: Self-pay | Admitting: Physical Medicine and Rehabilitation

## 2022-05-19 ENCOUNTER — Encounter
Payer: Medicare Other | Attending: Physical Medicine and Rehabilitation | Admitting: Physical Medicine and Rehabilitation

## 2022-05-19 VITALS — BP 131/78 | HR 68 | Ht 63.0 in | Wt 142.0 lb

## 2022-05-19 DIAGNOSIS — M797 Fibromyalgia: Secondary | ICD-10-CM | POA: Insufficient documentation

## 2022-05-19 DIAGNOSIS — G500 Trigeminal neuralgia: Secondary | ICD-10-CM | POA: Diagnosis not present

## 2022-05-19 NOTE — Progress Notes (Signed)
Subjective:    Patient ID: [name], female    DOB: 1941-05-04, 81 y.o.   MRN: [id number]  HPI  [name] presents today for f/u of severe trigeminal neuralgia.   1) Severe trigeminal neuralgia: -she is still not doing well. -she cannot get the right level of stimulation. Before she left she decreased it by 50%. Now she is on a cheat program and the lowest amount. It is helping the teeth but hurting the eye.  -Dr. Lebron Quam had a baby on 11/14 and she is not coming back until January.  -she talked to the representative -last night she turned off the stimulator -she slept almost day today and did not want to wake up for supper which was rare for her -she is having a difficult time weaning off the hydrocodone.  -she has been off of it in two days -she is expecting a return call from Dr. Billey Chang team soon to discuss wean off fentanyl -drove up on Tuesday and on Wednesday was supposed to have an appointment with pain psychologist. When they got there they were told that appointment had been changed to virtual. The next day they had a more sophisticated MRI. [name] did moving in MRI so MRI was not as helpful. The next day they saw Dr. Lebron Quam and discussed several procedural options and then went back to the motor stimulator- that is scheduled on October 16th.  -she recently went on vacation and had to spend the majority of the vacation in bed due to the severity of her pain -she stopped using the intranasal ketamine because although it helped initially, it appeared to make her pain worse later -she asks if she can wean off some medication as she had a recent appointment with Dr. Lebron Quam and was told she may have a lot of postoperative pain if she is taking so much pain medication. She would still like to do this and also experiences sedation from her medication. She asks how she would go about weaning her medications -she asks if she should wean off the gabapentin since this makes her so  sleepy She asks if she should restart Topamax. -she was encouraged by her phone visit with Dr. Lebron Quam and plans to pursue surgery. She is going to [facility] next week for Brain MRI, neuropsych eval, and to discuss the risks and benefits of the procedure with Dr. Lebron Quam -pain was so severe in the past that she expressed suicidality to her husband [name].  -she feels that none of her medications are helpful but she knows that when she tries to wean them her pain worsens.  -she asks about refill of the compounding cream -her husband asks about referral to ketamine clinic for depression rather than pain -she went back to the dentist because it was urting between two teeth and he said there is nothing wrong with those teeth and it is the trigeminal nerve When she took a shower that usually seems to help with but was migrating around her face.  -appointment with neurology in July -Dr. Lebron Quam cancelled the zoom call in April but this was rescheduled to July.  -she finds that only the gabapentin helps -she is not sure if the fentanyl helps When she is feeling really bad she tries to take another one but she gets very sleepy and dizzy.  -she is taking 8 a day of '100mg'$  per day- she takes two gabapentin and tylenol and hydrocodone in the morning. She takes another two at breakfast around  11, another 2 at lunch around 1:30pm.  -she had a recent root canal and pain has become much more severe after that -her pain is in the two teeth next to where she had the root canal -Oragel heps for a bout an hour.  -she tried increasing her Gabapentin to '300mg'$  TID but she could not tolerate this. It made her too sleepy.  -she contacted Dr. Davy Pique and he recommended she pause wean of Fentanyl patch and I agree with this -she took last patch last night. She still has 12.15mg patch available -her husband asks whether she should take a higher dose of gabapentin -the fentanyl patch makes her very sleepy and so allows her to  sleep. It has also made her dizzier.  -the 516m fentanyl patch did not provide more relief- she would like to wean off. Weaned to 25, then 12.26m32m then off this week -[facility] and not to Dr. WolJuel Burrowe states and they need a new referral send to Dr. LieVan Clineshe is using the compound cream and this helps- she uses this twice per day -capsaicin made it burn  -lamotrigine makes her too sleepy and dizzy to come down the stairs by herself and she does not feel comfortable going to the shower -left eye more open than usual. -she does not feel she is getting relief from the fentanyl patch -she feels that the procedure at [facility] is a last resort.  -Given darkening of skin and worsening eye lid closure, MRI brain stat obtained and shows new cyst that could be post-surgical. Recommended she go to ED given worsening dizziness and facial swelling as well. ED physician discussed case with NSGY and neurology and discharged home recommending follow-up with Dr. EicGean Quintd Dr. OstMerceda Elkshe was given IV Fentanyl in the ED which helped and discharged on 15m37mentanyl patch, which has not helped.  -discussed increasing Fentanyl patch to 226mc51mnce she has not experienced any negative side effects, no respiratory depression, and her husband is agreeable. I have sent in new script to start Saturday, but insurance required prior auth, which was approved.  -tonight would be the third tay of the 226mcg48mtanyl patch -she is still taking the Gabapentin -she feels that when she lays down she is spinning.  -eye continues to appear more closed.  -Husband has called Dr. EichmaDionne Anor. OstegaEarlie Ravelinge to scheduled follow-up appointments and has appointments with both tomorrow.  -Will run out of Fentanyl patch by Sunday -pain is worsening and nothing is providing relief -she asks whether she should go to ED -she has also noted dizziness and is not sure if this is from being in  the MRI or from the [place] was not effective and caused itching, sarna lotion did help wit this.  -hydrocodone has been most effective thus far -nucynta was not effective but she is not sure whether she gave it a fair trial -she has month appointments scheduled with EuniceZella Ball/u with me in Feb -she used the Norco last night and this morning with good benefit and has started to feel the pain return this afternoon -she did find benefit from the compound cream- she continues to use this.  -She continues to have a sharp shooting pain doing around her eyelid and the tip teeth and in the top of her head.  -has a radiofrequency ablation treatment that helped but not completely -she is not having as much sharp knife-like pin like in her  eye Her top teeth in her left law have been hurting really badly.  -she now takes the baclofen with the gabapentin and they seem to work well together.  -she had surgical procedure and feels worse -yesterday she took a half a tablet but this morning she took a whole one. She does fine with the hydrocodone but a whole one makes her too sleepy.  -she is taking 2 '100mg'$  Gabapentin three times per day but it makes her dizzy and sleepy so she cannot funciton -the cold worsens her pain.  -The IV fentanyl did help with the pain.  -her husband feels she should get one more dose of the '25mg'$  fentanyl. -she did get one week benefit from the steroid injection.  -she is doing terribly -she is miserable -she feels depressed due to the pain -she has considered taking the whole bottle of hydrocodone because because she can't take this pain anymore.  -she started taking the Gabapentin '300mg'$  TID.  -she does find relief from the hydrocodone -the pain is constant -she wonders if it is inflammation.  -she has not tried turmeric. Her son-in-law gave her a bottle and she couldn't stomach it.   2) Fibromyalgia: -She has been almost nonfunctional due to the severity of her  pain. It continues to be very severe.  -She would like to wean off the Gabapentin as she feels at risk of falls -she is taking Topamax BID. She felt this was helpful but made her unsteady on her feet.  -still using gabapentin and this is helping.  -she feels so fatigued that she can only have a shower every other day.   3) Osteoporosis: -husband asks whether she should get a Prolia shot.   4) Dizziness: -head was swimming -she felt like she was going to fall.  -worsening -feels like she is spinning, not the room  -She tried the Phenytoin for 2 days and did not feel benefit so stopped it.   -she feels the gabapentin aggravates this and she would love to wean off but knows it helps her.   -She feels very sleepy during the day.   -She has never tried Topirmate before.   5) Sore throat -developed stopping lamotrigine since not helping.   Prior history:  Her pain continues to be in her left eye, forehead. She has not tried any topicals for pain. She uses capsaicin for her back and knees.   Since last visit nothing we have tried has helped with her pain. She has tried to decrease her Gabapentin dose but then her pain increased so she has resumed taking 4 Gabapentin per day.   Her husband asks whether he should contact her surgeon regarding whether nerve may have become unclamped.   Discussed prolotherapy, trigger point injections, Baclofen, Clonazepam, Phenytoin as other options we could try,=.  Pain continues to be excruciating and she states she is not sure how much longer she can live like this.   Prior history: She has having some nausea, lightheadedness and felt this may be related to her Gabapentin. She did not take any yesterday and then felt more sever pain in the evening. She also gets electrical shooting across her jaw.   She does not feel that she has cluster headaches as the pain has been so constant. She feels that she is getting worse. The oxygen made her feel relaxed  but it is not helping.   The Sarna lotion does help with the itching in her head.   She was  willing to try the Amitriptyline but it makes her too sleepy in the morning.   Her teeth and left eye incredibly hurt.   She feels that the Gabapentin has been making her dizziness.   She asks about biofeedback.   The pain is 7/10 on average, similar to last time.   When the pain is severe it can make her depressed.   Pain Inventory Pain Right Now 10 My pain is constant, sharp, burning, stabbing, and aching  In the last 24 hours, has pain interfered with the following? General activity 7 Relation with others 8 Enjoyment of life 10 What TIME of day is your pain at its worst? morning  and evening Sleep (in general) Good  Pain is worse with: bending and standing Pain improves with: rest, heat/ice, and medication Relief from Meds: 2  Family History  Problem Relation Age of Onset   Heart disease Mother    Heart disease Father    Stroke Father    Stroke Sister    Asthma Daughter    Colon cancer Neg Hx    Social History   Socioeconomic History   Marital status: Married    Spouse name: Not on file   Number of children: 2   Years of education: college   Highest education level: Not on file  Occupational History   Occupation: housewife    Employer: RETIRED  Tobacco Use   Smoking status: Never   Smokeless tobacco: Never  Vaping Use   Vaping Use: Never used  Substance and Sexual Activity   Alcohol use: No    Alcohol/week: 0.0 standard drinks of alcohol   Drug use: No   Sexual activity: Not on file  Other Topics Concern   Not on file  Social History Narrative   1 cup of Jasmine tea daily    Social Determinants of Health   Financial Resource Strain: Not on file  Food Insecurity: Not on file  Transportation Needs: Not on file  Physical Activity: Not on file  Stress: Not on file  Social Connections: Not on file   Past Surgical History:  Procedure Laterality Date    CHOLECYSTECTOMY  03/2010   COLONOSCOPY  2009   Dr. Raford Pitcher    CYSTOSCOPY     Multiple Cystoscopies with stents or biopsy    ESOPHAGOGASTRODUODENOSCOPY  10/2013   EYE SURGERY     bilateral cataract removal   Gamma knife radiation  01/2018   for trigeminal neuralgia   REFRACTIVE SURGERY Bilateral 2022   RHIZOTOMY Left 04/28/2021   Procedure: Left Percutaneous trigeminal balloon rhizotomy;  Surgeon: Judith Part, MD;  Location: [place];  Service: Neurosurgery;  Laterality: Left;   TONSILLECTOMY     Past Surgical History:  Procedure Laterality Date   CHOLECYSTECTOMY  03/2010   COLONOSCOPY  2009   Dr. Raford Pitcher    CYSTOSCOPY     Multiple Cystoscopies with stents or biopsy    ESOPHAGOGASTRODUODENOSCOPY  10/2013   EYE SURGERY     bilateral cataract removal   Gamma knife radiation  01/2018   for trigeminal neuralgia   REFRACTIVE SURGERY Bilateral 2022   RHIZOTOMY Left 04/28/2021   Procedure: Left Percutaneous trigeminal balloon rhizotomy;  Surgeon: Judith Part, MD;  Location: [place];  Service: Neurosurgery;  Laterality: Left;   TONSILLECTOMY     Past Medical History:  Diagnosis Date   Adenomatous colon polyp 03/24/2018   Allergy    Arthritis    Cataract    Chronic headaches  Depression    Fibromyalgia    Gallstones    GERD (gastroesophageal reflux disease)    Hyperlipemia    Hypertension    Interstitial cystitis    Lactose intolerance    LBBB (left bundle branch block)    Osteoarthritis    Osteopenia    Pneumonia    Thyroid disease    Tinnitus    Trigeminal neuralgia    There were no vitals taken for this visit.  Opioid Risk Score:   Fall Risk Score:  `1  Depression screen PHQ 2/9     02/03/2022    1:29 PM 12/25/2021    2:01 PM 12/24/2021    1:11 PM 11/27/2021   10:09 AM 10/20/2021    1:08 PM 10/20/2021   11:16 AM 09/23/2021    1:56 PM  Depression screen PHQ 2/9  Decreased Interest 0 0 1 0 1 0 0  Down, Depressed, Hopeless 0 3 1 0 '1 3 1  '$ PHQ  - 2 Score 0 3 2 0 '2 3 1  '$ Altered sleeping  0    1   Tired, decreased energy  0    2   Change in appetite  0    0   Feeling bad or failure about yourself   0    0   Trouble concentrating  0    0   Moving slowly or fidgety/restless  0    0   Suicidal thoughts  0    0   PHQ-9 Score  3    6   Difficult doing work/chores  Somewhat difficult    Somewhat difficult     Review of Systems  Constitutional: Negative.   HENT: Negative.    Eyes:  Positive for visual disturbance.  Respiratory: Negative.    Cardiovascular: Negative.   Gastrointestinal: Negative.   Endocrine: Negative.   Genitourinary: Negative.   Musculoskeletal: Negative.        Pain in left side of face  Skin: Negative.   Allergic/Immunologic: Negative.   Neurological:  Positive for dizziness, tremors, light-headedness and numbness.       Tingling   Hematological: Negative.   Psychiatric/Behavioral: Negative.    All other systems reviewed and are negative.      Objective:   Physical Exam Gen: no distress, normal appearing HEENT: oral mucosa pink and moist, NCAT Cardio: Reg rate Chest: normal effort, normal rate of breathing Abd: soft, non-distended Ext: no edema Psych: pleasant, normal affect Skin: intact Neuro: Alert and oriented x3    Assessment & Plan:  Mrs. Pereida is an 81 year old woman who presents for f/u of left sided trigeminal neuralgia and dizziness.    1) Sensory deafferation pain, left sided trigeminal neuralgia vs cluster headaches (she does have lacrimation and runny nose on that side). -discussed her response to the stimulator She does have a history of migraines.  -Discussed that if she has to be in bed all day due to her pain it may be beneficial for her husband to give her at least one hydrocodone per day to enable her to function -provided refill of Fentanyl patch as she will be leaving for her surgery in [place] soon and her husband plans to stay there for some time in case she has any  surgical complications.  -reviewed Dr. Billey Chang note with her, discussed the plan for her visit to [facility] next October.  -ordered MRI Brain with and without contrast, discussed the plan for repeat MRI brain next October -discussed  her follow-up with pain psychology.  -recommended weaning off Fentanyl, discussed stopping fentanyl because she is on 12.'5mg'$  today but she had tried this a couple of weeks ago and pain was so severe that she needed patch to be replaced.  -discussed that she is taking hydrocodone 4 tablets per day, and recommend to decrease this to 3 tabs per day for three days and then to keep decreasing the tab by once per day.  -discussed that skipping the morning dose of hydrocodone will at least allow her to sleep -recommended trying Frankincense and Myrrh essential oils mixed in coconut oil and applied to area of pain -recommended restarting Topamax -called [facility] to prescribe compounding gel refill -continue Gabapentin '100mg'$  up to 8 times a day, discussed her current schedule of takin -continue Fentanyl patch to 67mg and she is agreeable. Discussed that phenytoin, carbamazepine -discussed using Oragel multiple times per ay since this helps for an hour, use the topical cream intermittently in between -[facility] for her with them. Unfortunately Dr. LVan Clineshas retired but was able to set her up with Dr. SChristin Bachwho specializes in migraines and trigeminal neuralgia on July 25th. Let patient and husband know. They will mail her their address.  -restart 12.51m fentanyl patch as patient does not feel she can get through weekend with this level of pain -commended her for calling Dr. EiDavy Piques well to let him know how she is feeling -recommended taking 1 hydrocodone now for the severe pain, and discussed that the Fentanyl patch with take 12-24 hours to kick in -prescribed Narcan while on Fentanyl -discussed with April  the neurology referral and she did specify Dr. WoJuel Burrownd [facility] -discussed CBD oil, her son bought her some of this and she felt that it greatly helped  Recommend topical CBD oil- discussed its benefits in reducing inflammation, pain, insomnia, and anxiety, but she stopped because effect wore of.  -Discussed that CBD oil differs from marijuana in that it does not contain THC- the substance that causes euphoria.  -Discussed that it is made from the hemp plant.  -It has been used for thousands of years -preliminary research suggests that if may be able to shrink cancerous tumors, stop plaque formation in Alzheimer's Disease, and slow the progress of brain disease from concussions.  -Additional benefits that have been demonstrated in studies include improved nausea, indigestion, and brain health, and reduced seizures.  -In a survey 92% of patients who tried medical cannabis felt it improved symptoms such as chronic pain, arthritis, migraines, and cancer.   -Provided with a pain relief journal and discussed that it contains foods and lifestyle tips to naturally help to improve pain. Discussed that these lifestyle strategies are also very good for health unlike some medications which can have negative side effects. Discussed that the act of keeping a journal can be therapeutic and helpful to realize patterns what helps to trigger and alleviate pain.   -discussed with Dr. OsJoaquim Namrigeminal deafferation pain, discussed the difference between this and trigeminal neuralgia with patient and husband, discussed motor cortex stimulation. -discussed whether or not to continue fentanyl patch, husband does feel that the IV fentanyl  -encouraged following with Dr. Sweet/Dr. MiSabra Heckt [facility] to try procedure recommended by D. Ostegard.  -discussed mechanism of action of low dose naltrexone as an opioid receptor antagonist which stimulates your body's production of its  own natural endogenous opioids, helping  to decrease pain. Discussed that it can also decrease T cell response and thus be helpful in decreasing inflammation, and symptoms of brain fog, fatigue, anxiety, depression, and allergies. Discussed that this medication needs to be compounded at a compounding pharmacy and can more expensive. Discussed that I usually start at '1mg'$  and if this is not providing enough relief then I titrate upward on a monthly basis.   -stop lamotrigine as it is making her dizzy without benefit -reviewed patient's medical notes -discussed her progress with getting a referral to [facility] -recommended going to ED given darkening of skin, worsening eye closure, finding of cyst, dizziness to receive prompt neurosurgical eval regarding if surgery is necessary, and also for pain management given worsening pain refractory to all treatments and resulting suicidality. Reviewed ED doc notes- he discussed with on-call neurology and NSGY and determined that patient could be discharged with outpatient follow-up. IV fentanyl was administered with relief to patient. She was discharged on fentanyl patch which has not provided relief. She is getting 64mg- no side effects- discussed increasing to 231m and her husband is in agreement. Prior auth completed and approved. Discussed with husband starting patch on Satruday after she has worn 1227mpatch for 72 hours -encouraged follow-up with Dr. OstJoaquim Namusband let me know appointment has been set up 1/18.  -recommended follow-up with Dr. 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  -Discussed that turmeric  -discussed steroid  taper. -continue savella, husband feels like it is helping with her suicidal thoughts. Increase to '25mg'$ .  -continue hydrocodone.  -discuss mouth guard with dentist.  -d/c topamax -she does have another procedure scheduled. -discussed benefits of infrared light.  -Continue Norco '5mg'$  TID PRN -follow-up monthly with EunZella Balld with me in February, placed on waitlist for earlier appointment with me, advised phone visits/MyChart are a great way to communicate in the interim -discussed that she can take an additional one or two tylenol for breakthrough pain -recommended checking CMP for liver enzymes with next labs -prescribed NAC '600mg'$  BID to protect the liver while taking tylenol, discussed its health benefits for mitochondrial function -d/c Baclofen  -f/u with Dr. OstJoaquim Namd Dr. EicDavy Pique18 -discussed retrying carbamazepine to see if it is effective for her. It is $89 for her, so we discussed trying Lamictal instead.  -encouraged anti-inflammatory diet.  -she had sinusitis that was viral and was treated with antibiotics. This was in the early 90s24she symptoms resolved after 2 months. She uses to get sinus infections and saline rinse.  -She has had multiple root canals.  -Discontinue 100% oxygen via facemask as this was not helpful.  -Discussed that unfortunately Sprint PNS is not indicated for craniofacial pain. -Amitriptyline and Lyrica did not help.  -Continue Cymbalta '60mg'$ .  -Topiramate helped but caused dizziness.  -Provided with hand out with information regarding trigeminal neuralgia -Failed Penytoin.  -trial lidocaine patch -She would like to try biofeedback. -reviewed neurology note.  -referred to Dr. WolJuel Burrow-will try trigger point injections next visit.  -Continue Ketamine 10%, Baclofen 2%, Cyclobenzaprine 2%, Ketoprofen 10%, Gabapentin 6%, Bupivacaine 1%, Amitryptiline 5% to apply to painful areas- using 3-4 times per day.  -Can consider Meloxicam if Phenytoin  does not help.  -  Discussed Qutenza as an option for neuropathic pain control. Discussed that this is a capsaicin patch, stronger than capsaicin cream. Discussed that it is currently approved for diabetic peripheral neuropathy and post-herpetic neuralgia, but that it has also shown benefit in treating other forms of neuropathy. Provided patient with link to site to learn more about the patch: [employer]. Discussed that the patch would be placed in office and benefits usually last 3 months. Discussed that unintended exposure to capsaicin can cause severe irritation of eyes, mucous membranes, respiratory tract, and skin, but that Qutenza is a local treatment and does not have the systemic side effects of other nerve medications. Discussed that there may be pain, itching, erythema, and decreased sensory function associated with the application of Qutenza. Side effects usually subside within 1 week. A cold pack of analgesic medications can help with these side effects. Blood pressure can also be increased due to pain associated with administration of the patch.  -she cut down on her gabapentin due to her dizziness    Turmeric to reduce inflammation--can be used in cooking or taken as a supplement.  Benefits of turmeric:  -Highly anti-inflammatory  -Increases antioxidants  -Improves memory, attention, brain disease  -Lowers risk of heart disease  -May help prevent cancer  -Decreases pain  -Alleviates depression  -Delays aging and decreases risk of chronic disease  -Consume with black pepper to increase absorption    Turmeric Milk Recipe:  1 cup milk  1 tsp turmeric  1 tsp cinnamon  1 tsp grated ginger (optional)  Black pepper (boosts the anti-inflammatory properties of turmeric).  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  -she is a good metabolizer of amitriptyline and has tolerated nortrypilline in the past, but without much benefit.    3) CKD: encouraged 6-8 glasses of water per day. She states that she had AKI with Ibuprofen in the past. Discussed checking Cr level next visit if we are considering Meloxicam -Discussed that Nucynta, and other pain medications, may stay in her bloodstream longer given her CKD   4) Hypernatremia: encouraged hydration   5) General health and pain: provided with list of healthy foods that are both nutritious and fight pain.   6) Depressed: Amitriptyline did help with this but she could not tolerate the drowsiness. Encouraged focusing on the factors that she can control, such as anti-inflammatory diet.   7) Fibromyalgia: Discussed goal to wean steroids prior to surgery. Continue '1mg'$  daily. She is on that intermittently. She started this again about a month ago. She has been on this about a year. Continue Savella -discussed benefits of cold water showers.  -continue gabapentin.   8) Dizziness: -discussed that this could be from the combination of gabapentin and baclofen.  -discussed that all the medications we are trying for pain may worsen the dizziness  9) Constipation:  -Provided list of following foods that help with constipation and highlighted a few: 1) prunes- contain high amounts of  fiber.  2) apples- has a form of dietary fiber called pectin that accelerates stool movement and increases beneficial gut bacteria 3) pears- in addition  to fiber, also high in fructose and sorbitol which have laxative effect 4) figs- contain an enzyme ficin which helps to speed colonic transit 5) kiwis- contain an enzyme actinidin that improves gut motility and reduces constipation 6) oranges- rich in pectin (like apples) 7) grapefruits- contain a flavanol naringenin which has a laxative effect 8) vegetables- rich in fiber and also great sources of folate, vitamin C, and K 9) artichoke- high in inulin, prebiotic great for the microbiome 10) chicory- increases stool frequency and softness (can be added to coffee) 11) rhubarb- laxative effect 12) sweet potato- high fiber 13) beans, peas, and lentils- contain both soluble and insoluble fiber 14) chia seeds- improves intestinal health and gut flora 15) flaxseeds- laxative effect 16) whole grain rye bread- high in fiber 17) oat bran- high in soluble and insoluble fiber 18) kefir- softens stools -recommended to try at least one of these foods every day.  -drink 6-8 glasses of water per day -walk regularly, especially after meals.   10) Osteopenia - was tapered off the prednisone -discussed that currently her pain may be more of a risk to her than osteopenia given her suicidal ideation from her pain -discussed risk of fall with the medications that make her dizziness.   11) Daytime sleepiness -discussed that this is secondary to her medications.  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

## 2022-05-21 ENCOUNTER — Ambulatory Visit (INDEPENDENT_AMBULATORY_CARE_PROVIDER_SITE_OTHER): Payer: Medicare Other | Admitting: Sports Medicine

## 2022-05-21 VITALS — BP 126/76 | Ht 63.0 in | Wt 140.0 lb

## 2022-05-21 DIAGNOSIS — M25562 Pain in left knee: Secondary | ICD-10-CM | POA: Diagnosis not present

## 2022-05-21 DIAGNOSIS — G8929 Other chronic pain: Secondary | ICD-10-CM | POA: Diagnosis not present

## 2022-05-21 MED ORDER — METHYLPREDNISOLONE ACETATE 40 MG/ML IJ SUSP
40.0000 mg | Freq: Once | INTRAMUSCULAR | Status: AC
  Administered 2022-05-21: 40 mg via INTRA_ARTICULAR

## 2022-05-21 NOTE — Progress Notes (Signed)
Patient ID: TARNESHA ULLOA, female   DOB: 1941-04-01, 81 y.o.   MRN: [id number]  Patient presents today for a cortisone injection into the left knee.  Please see the office note from May 12, 2022 for details regarding history and physical exam findings.  Cortisone injection administered as below.  An anterior lateral approach was utilized.  She is an established patient with Dr. Marlou Sa also.  She will follow-up with him for possible repeat viscosupplementation if today's cortisone injection is ineffective.  Consent obtained and verified. Time-out conducted. Noted no overlying erythema, induration, or other signs of local infection. Skin prepped in a sterile fashion. Topical analgesic spray: Ethyl chloride. Joint: Left knee Needle: 25-gauge 1.5 inch Completed without difficulty. Meds: 3 cc 1% Xylocaine, 1 cc (40 mg) Depo-Medrol  This note was dictated using Dragon naturally speaking software and may contain errors in syntax, spelling, or content which have not been identified prior to signing this note.

## 2022-05-26 ENCOUNTER — Ambulatory Visit: Payer: Medicare Other | Admitting: Physical Medicine and Rehabilitation

## 2022-06-01 DIAGNOSIS — E039 Hypothyroidism, unspecified: Secondary | ICD-10-CM | POA: Diagnosis not present

## 2022-06-02 DIAGNOSIS — M353 Polymyalgia rheumatica: Secondary | ICD-10-CM | POA: Diagnosis not present

## 2022-06-02 DIAGNOSIS — R197 Diarrhea, unspecified: Secondary | ICD-10-CM | POA: Diagnosis not present

## 2022-06-03 DIAGNOSIS — H353131 Nonexudative age-related macular degeneration, bilateral, early dry stage: Secondary | ICD-10-CM | POA: Diagnosis not present

## 2022-06-03 IMAGING — MR MR LUMBAR SPINE W/O CM
4 of 5 series · 28 of 48 positions shown · non-contrast
Comparison: MRI 07/04/2016.

CLINICAL DATA: Lumbar radiculopathy, symptoms persist with > 6 wks
treatment

EXAM:
MRI LUMBAR SPINE WITHOUT CONTRAST
TECHNIQUE: Multiplanar, multisequence MR imaging of the lumbar spine was
performed. No intravenous contrast was administered.

[Series 2: T2 · sagittal · 4.0mm · 1.09mm/px · 6 of 15 slices shown (1 of 2)]
[im 1/15]
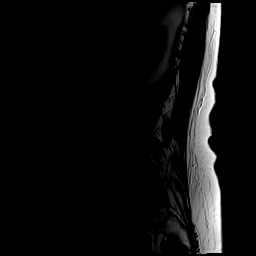
[im 3/15]
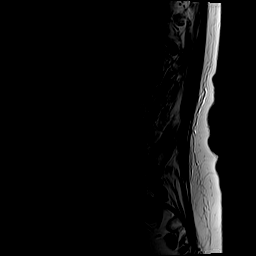
[im 6/15]
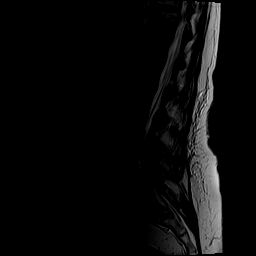
[im 9/15]
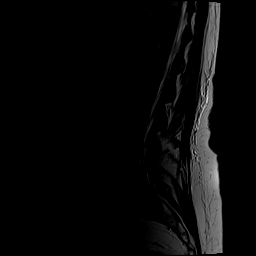
[im 12/15]
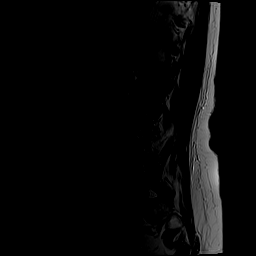
[im 15/15]
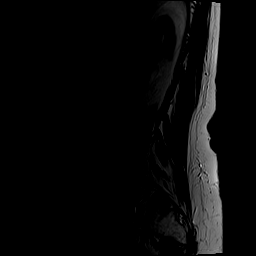

[Series 4: T1 · sagittal · 4.0mm · 1.09mm/px · 6 of 15 slices shown (1 of 2)]
[im 1/15]
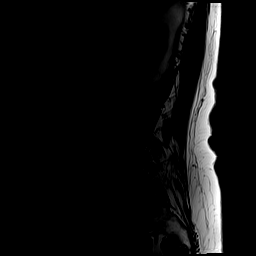
[im 3/15]
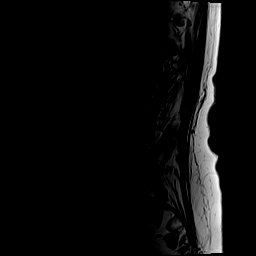
[im 6/15]
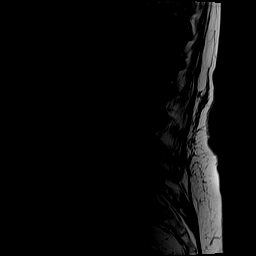
[im 9/15]
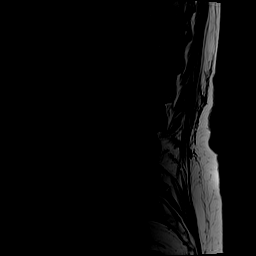
[im 12/15]
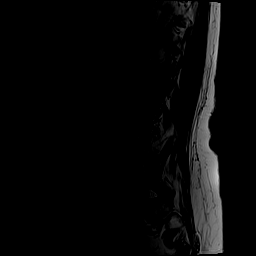
[im 15/15]
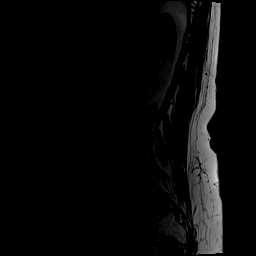

[Series 5: T2 · axial · 4.0mm · 0.39mm/px · z∈[-123,+82]mm · 9 of 39 slices shown (2 of 2)]
[im 1/39]
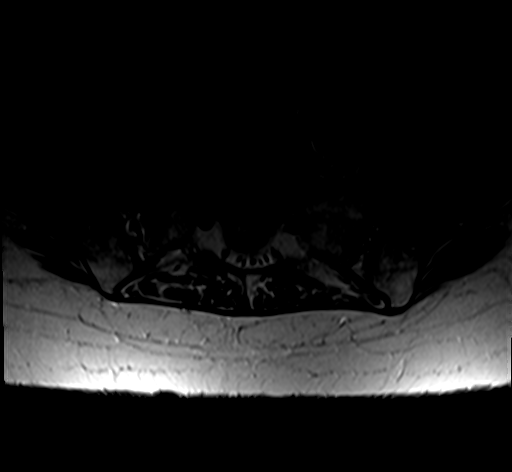
[im 6/39]
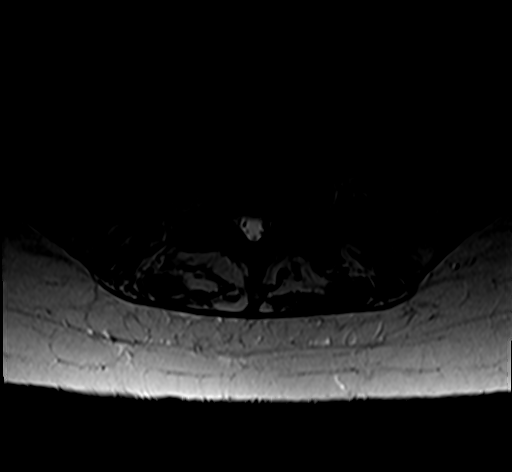
[im 11/39]
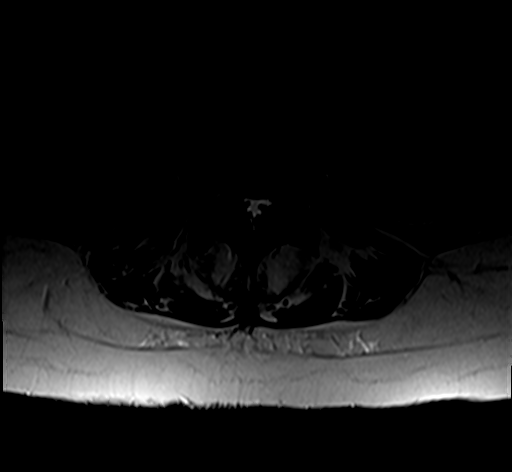
[im 17/39]
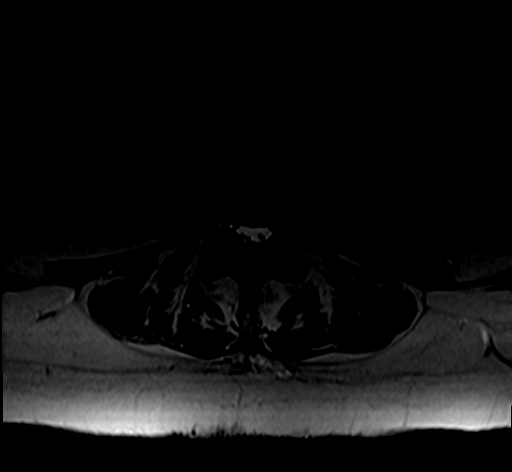
[im 20/39]
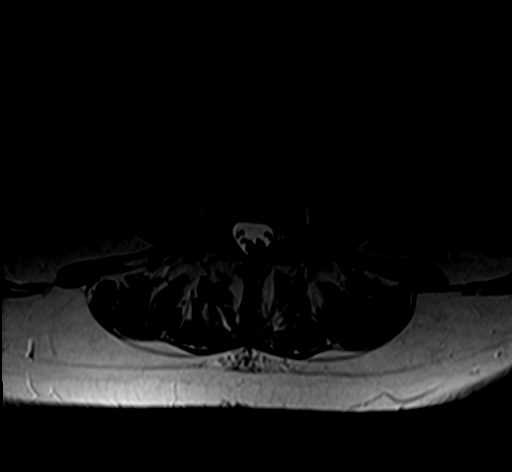
[im 22/39]
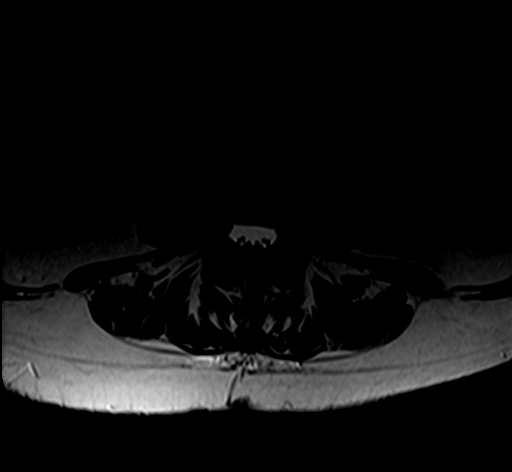
[im 28/39]
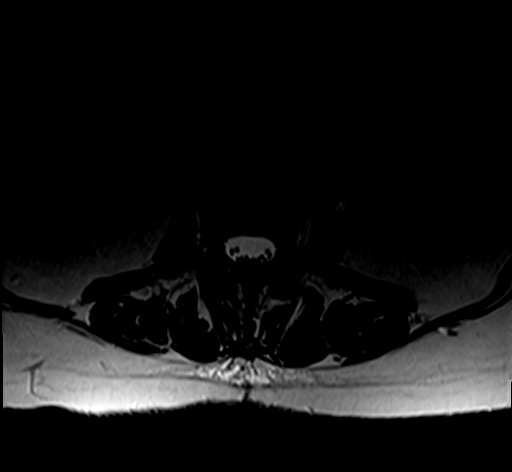
[im 33/39]
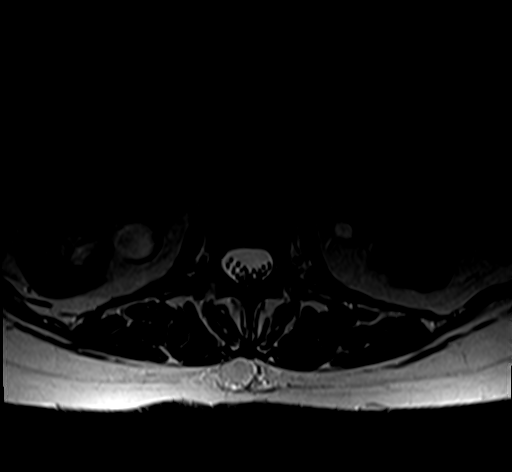
[im 39/39]
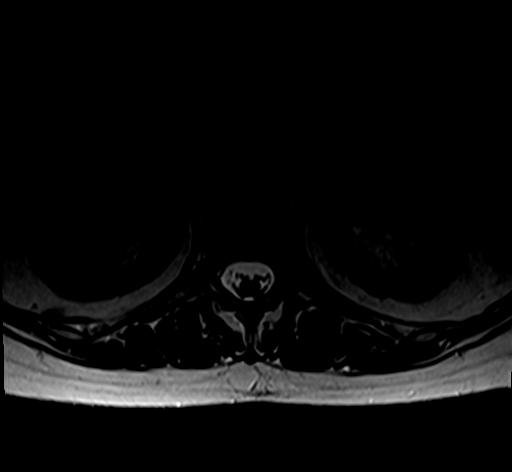

[Series 6: T1 · axial · 4.0mm · 0.39mm/px · z∈[-125,+55]mm · 7 of 39 slices shown (2 of 2)]
[im 1/39]
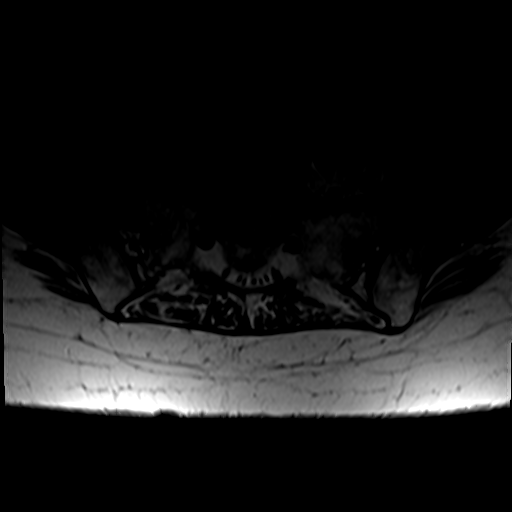
[im 6/39]
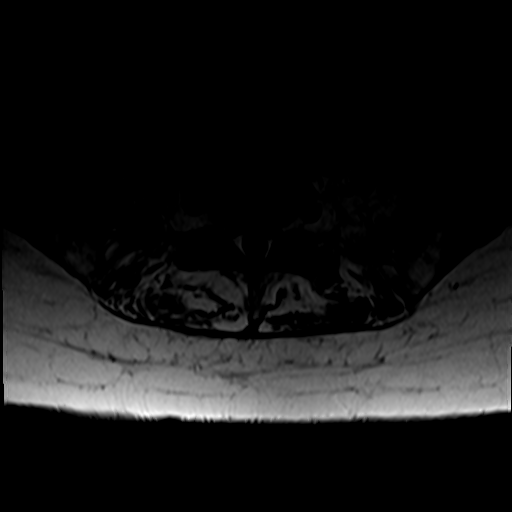
[im 11/39]
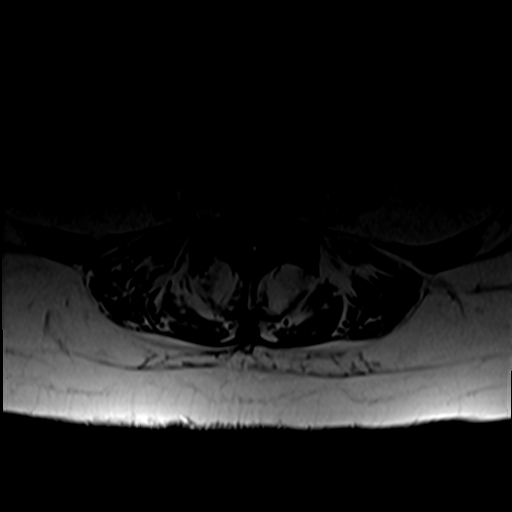
[im 17/39]
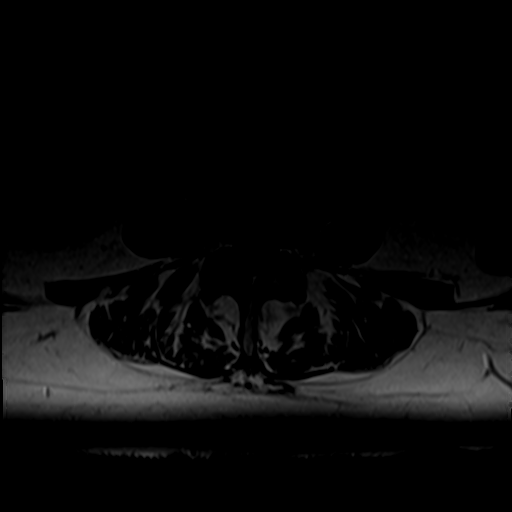
[im 20/39]
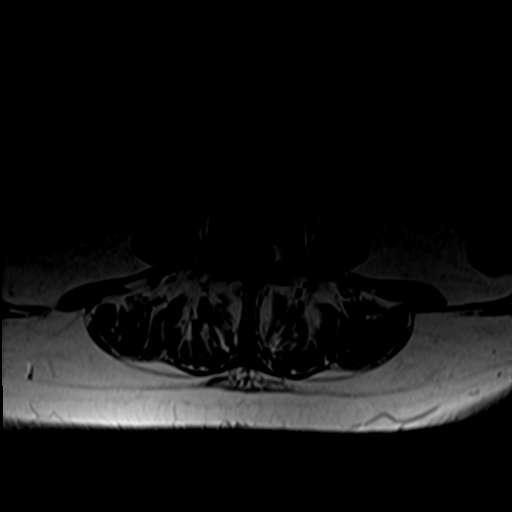
[im 22/39]
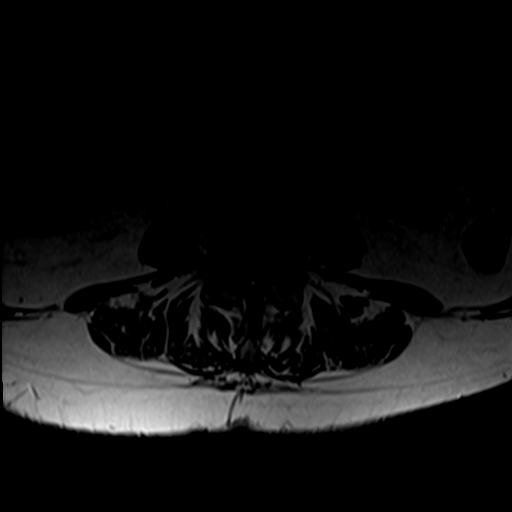
[im 33/39]
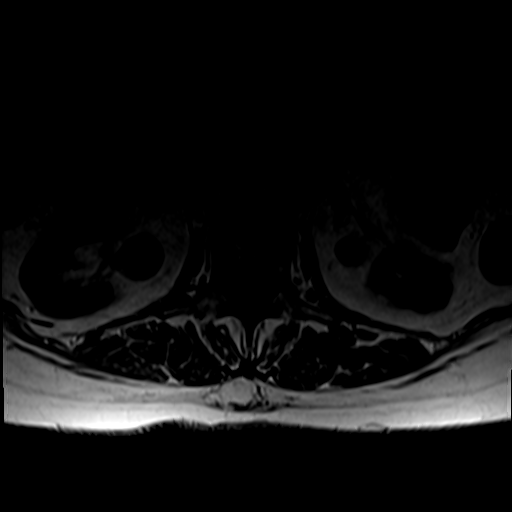

[28 of 48 positions shown; findings below may reference images not displayed]

FINDINGS: Segmentation: Inferior-most fully formed intervertebral disc is
labeled L5-S1, same as on the prior MRI.

Alignment:  The mild (grade 1) anterolisthesis of L4 on L5, similar.

Vertebrae: Vertebral body heights are maintained. No focal marrow
edema chest acute fracture discitis/osteomyelitis. Mildly
heterogeneous marrow without suspicious bone lesions.

Conus medullaris and cauda equina: Conus extends to the L1 level.
Conus appears normal.

Paraspinal and other soft tissues: Suspected left renal scarring.
Otherwise, unremarkable.

Disc levels:

T12-L1: No significant disc protrusion, foraminal stenosis, or canal
stenosis.

L1-L2: Mild disc bulging without significant stenosis.

L2-L3: Mild disc bulging without significant stenosis.

L3-L4: Disc bulging, ligamentum flavum thickening and facet
arthropathy with mild canal stenosis, mildly progressed. No
significant foraminal stenosis.

L4-L5: Similar grade 1 anterolisthesis. Superimposed right eccentric
disc bulge, ligamentum flavum thickening and facet arthropathy.
Resulting moderate right and mild left subarticular recess stenosis
and mild canal stenosis, mildly progressed. Mild bilateral foraminal
stenosis.

L5-S1: Mild disc bulging. Facet arthropathy. No significant
stenosis.
IMPRESSION: 1. At L4-L5, mildly progressed moderate right and mild left
subarticular recess stenosis with mild canal and bilateral foraminal
stenosis.
2. At L3-L4, mildly progressed mild canal stenosis.

## 2022-06-18 ENCOUNTER — Ambulatory Visit: Payer: Medicare Other | Admitting: Psychology

## 2022-06-30 ENCOUNTER — Ambulatory Visit: Payer: Medicare Other | Admitting: Physical Medicine and Rehabilitation

## 2022-07-07 ENCOUNTER — Other Ambulatory Visit: Payer: Self-pay | Admitting: *Deleted

## 2022-07-07 DIAGNOSIS — M25562 Pain in left knee: Secondary | ICD-10-CM | POA: Diagnosis not present

## 2022-07-07 DIAGNOSIS — G8929 Other chronic pain: Secondary | ICD-10-CM

## 2022-07-07 DIAGNOSIS — M797 Fibromyalgia: Secondary | ICD-10-CM | POA: Diagnosis not present

## 2022-07-07 DIAGNOSIS — M810 Age-related osteoporosis without current pathological fracture: Secondary | ICD-10-CM | POA: Diagnosis not present

## 2022-07-07 DIAGNOSIS — M170 Bilateral primary osteoarthritis of knee: Secondary | ICD-10-CM | POA: Diagnosis not present

## 2022-07-07 DIAGNOSIS — M150 Primary generalized (osteo)arthritis: Secondary | ICD-10-CM | POA: Diagnosis not present

## 2022-07-07 DIAGNOSIS — M25559 Pain in unspecified hip: Secondary | ICD-10-CM | POA: Diagnosis not present

## 2022-07-07 DIAGNOSIS — M353 Polymyalgia rheumatica: Secondary | ICD-10-CM | POA: Diagnosis not present

## 2022-07-07 DIAGNOSIS — R29898 Other symptoms and signs involving the musculoskeletal system: Secondary | ICD-10-CM

## 2022-07-08 DIAGNOSIS — R519 Headache, unspecified: Secondary | ICD-10-CM | POA: Diagnosis not present

## 2022-07-14 DIAGNOSIS — Z9682 Presence of neurostimulator: Secondary | ICD-10-CM | POA: Diagnosis not present

## 2022-07-14 DIAGNOSIS — G509 Disorder of trigeminal nerve, unspecified: Secondary | ICD-10-CM | POA: Diagnosis not present

## 2022-07-16 ENCOUNTER — Telehealth: Payer: Self-pay

## 2022-07-16 NOTE — Telephone Encounter (Signed)
VOB submitted for Monovisc, bilateral knee.  

## 2022-07-17 DIAGNOSIS — R519 Headache, unspecified: Secondary | ICD-10-CM | POA: Diagnosis not present

## 2022-07-17 DIAGNOSIS — M25569 Pain in unspecified knee: Secondary | ICD-10-CM | POA: Diagnosis not present

## 2022-07-20 DIAGNOSIS — H353131 Nonexudative age-related macular degeneration, bilateral, early dry stage: Secondary | ICD-10-CM | POA: Diagnosis not present

## 2022-07-20 DIAGNOSIS — H3530 Unspecified macular degeneration: Secondary | ICD-10-CM | POA: Diagnosis not present

## 2022-07-21 ENCOUNTER — Encounter
Payer: Medicare Other | Attending: Physical Medicine and Rehabilitation | Admitting: Physical Medicine and Rehabilitation

## 2022-07-21 ENCOUNTER — Encounter: Payer: Self-pay | Admitting: Physical Medicine and Rehabilitation

## 2022-07-21 VITALS — BP 94/63 | HR 73 | Ht 63.0 in | Wt 140.0 lb

## 2022-07-21 DIAGNOSIS — M25569 Pain in unspecified knee: Secondary | ICD-10-CM | POA: Diagnosis not present

## 2022-07-21 DIAGNOSIS — G500 Trigeminal neuralgia: Secondary | ICD-10-CM | POA: Diagnosis not present

## 2022-07-21 DIAGNOSIS — R519 Headache, unspecified: Secondary | ICD-10-CM | POA: Diagnosis not present

## 2022-07-21 DIAGNOSIS — M797 Fibromyalgia: Secondary | ICD-10-CM | POA: Diagnosis not present

## 2022-07-21 DIAGNOSIS — H353190 Nonexudative age-related macular degeneration, unspecified eye, stage unspecified: Secondary | ICD-10-CM | POA: Diagnosis not present

## 2022-07-21 DIAGNOSIS — R42 Dizziness and giddiness: Secondary | ICD-10-CM | POA: Insufficient documentation

## 2022-07-21 MED ORDER — VITAMIN D (ERGOCALCIFEROL) 1.25 MG (50000 UNIT) PO CAPS: 50000.0000 [IU] | ORAL_CAPSULE | ORAL | 0 refills | Status: AC

## 2022-07-21 NOTE — Progress Notes (Addendum)
Subjective:    Patient ID: [name], female    DOB: January 17, 1941, 82 y.o.   MRN: [id number]  HPI  [name] presents today for f/u of severe trigeminal neuralgia.   1) Severe trigeminal neuralgia: -today pain is 2/10 -pain fluctuates  -she went to [place] last week with her children.  -she keeps a journal of when she changes the program and what her pain level is in her teeth, eyes, and cheek. She put in not to use the teeth program -she said if they could hit the nerve between the two teeth that would be helpful -they have her an 11 and 12 in that region,  -she cannot get the right level of stimulation. Before she left she decreased it by 50%. Now she is on a cheat program and the lowest amount. It is helping the teeth but hurting the eye.  -Dr. Lebron Quam had a baby on 11/14 and she is not coming back until January.  -she talked to the representative -last night she turned off the stimulator -she slept almost day today and did not want to wake up for supper which was rare for her -she is having a difficult time weaning off the hydrocodone.  -she has been off of it in two days -she is expecting a return call from Dr. Billey Chang team soon to discuss wean off fentanyl -drove up on Tuesday and on Wednesday was supposed to have an appointment with pain psychologist. When they got there they were told that appointment had been changed to virtual. The next day they had a more sophisticated MRI. [name] did moving in MRI so MRI was not as helpful. The next day they saw Dr. Lebron Quam and discussed several procedural options and then went back to the motor stimulator- that is scheduled on October 16th.  -she recently went on vacation and had to spend the majority of the vacation in bed due to the severity of her pain -she stopped using the intranasal ketamine because although it helped initially, it appeared to make her pain worse later -she asks if she can wean off some medication as she had a recent  appointment with Dr. Lebron Quam and was told she may have a lot of postoperative pain if she is taking so much pain medication. She would still like to do this and also experiences sedation from her medication. She asks how she would go about weaning her medications -she asks if she should wean off the gabapentin since this makes her so sleepy She asks if she should restart Topamax. -she was encouraged by her phone visit with Dr. Lebron Quam and plans to pursue surgery. She is going to [facility] next week for Brain MRI, neuropsych eval, and to discuss the risks and benefits of the procedure with Dr. Lebron Quam -pain was so severe in the past that she expressed suicidality to her husband [name].  -she feels that none of her medications are helpful but she knows that when she tries to wean them her pain worsens.  -she asks about refill of the compounding cream -her husband asks about referral to ketamine clinic for depression rather than pain -she went back to the dentist because it was urting between two teeth and he said there is nothing wrong with those teeth and it is the trigeminal nerve When she took a shower that usually seems to help with but was migrating around her face.  -appointment with neurology in July -Dr. Lebron Quam cancelled the zoom call in April but  this was rescheduled to July.  -she finds that only the gabapentin helps -she is not sure if the fentanyl helps When she is feeling really bad she tries to take another one but she gets very sleepy and dizzy.  -she is taking 8 a day of '100mg'$  per day- she takes two gabapentin and tylenol and hydrocodone in the morning. She takes another two at breakfast around 11, another 2 at lunch around 1:30pm.  -she had a recent root canal and pain has become much more severe after that -her pain is in the two teeth next to where she had the root canal -Oragel heps for a bout an hour.  -she tried increasing her Gabapentin to '300mg'$  TID but she could not tolerate this. It  made her too sleepy.  -she contacted Dr. Davy Pique and he recommended she pause wean of Fentanyl patch and I agree with this -she took last patch last night. She still has 12.6mg patch available -her husband asks whether she should take a higher dose of gabapentin -the fentanyl patch makes her very sleepy and so allows her to sleep. It has also made her dizzier.  -the 553m fentanyl patch did not provide more relief- she would like to wean off. Weaned to 25, then 12.76m76m then off this week -[facility] and not to Dr. WolJuel Burrowe states and they need a new referral send to Dr. LieVan Clineshe is using the compound cream and this helps- she uses this twice per day -capsaicin made it burn  -lamotrigine makes her too sleepy and dizzy to come down the stairs by herself and she does not feel comfortable going to the shower -left eye more open than usual. -she does not feel she is getting relief from the fentanyl patch -she feels that the procedure at [facility] is a last resort.  -Given darkening of skin and worsening eye lid closure, MRI brain stat obtained and shows new cyst that could be post-surgical. Recommended she go to ED given worsening dizziness and facial swelling as well. ED physician discussed case with NSGY and neurology and discharged home recommending follow-up with Dr. EicGean Quintd Dr. OstMerceda Elkshe was given IV Fentanyl in the ED which helped and discharged on 54m44mentanyl patch, which has not helped.  -discussed increasing Fentanyl patch to 276mc43mnce she has not experienced any negative side effects, no respiratory depression, and her husband is agreeable. I have sent in new script to start Saturday, but insurance required prior auth, which was approved.  -tonight would be the third tay of the 276mcg72mtanyl patch -she is still taking the Gabapentin -she feels that when she lays down she is spinning.  -eye continues to appear more closed.  -Husband has  called Dr. EichmaDionne Anor. OstegaEarlie Ravelinge to scheduled follow-up appointments and has appointments with both tomorrow.  -Will run out of Fentanyl patch by Sunday -pain is worsening and nothing is providing relief -she asks whether she should go to ED -she has also noted dizziness and is not sure if this is from being in the MRI or from the [place] was not effective and caused itching, sarna lotion did help wit this.  -hydrocodone has been most effective thus far -nucynta was not effective but she is not sure whether she gave it a fair trial -she has month appointments scheduled with EuniceZella Ball/u with me in Feb -she used the Norco last night and this morning with good benefit and  has started to feel the pain return this afternoon -she did find benefit from the compound cream- she continues to use this.  -She continues to have a sharp shooting pain doing around her eyelid and the tip teeth and in the top of her head.  -has a radiofrequency ablation treatment that helped but not completely -she is not having as much sharp knife-like pin like in her eye Her top teeth in her left law have been hurting really badly.  -she now takes the baclofen with the gabapentin and they seem to work well together.  -she had surgical procedure and feels worse -yesterday she took a half a tablet but this morning she took a whole one. She does fine with the hydrocodone but a whole one makes her too sleepy.  -she is taking 2 '100mg'$  Gabapentin three times per day but it makes her dizzy and sleepy so she cannot funciton -the cold worsens her pain.  -The IV fentanyl did help with the pain.  -her husband feels she should get one more dose of the '25mg'$  fentanyl. -she did get one week benefit from the steroid injection.  -she is doing terribly -she is miserable -she feels depressed due to the pain -she has considered taking the whole bottle of hydrocodone because because she can't take this pain  anymore.  -she started taking the Gabapentin '300mg'$  TID.  -she does find relief from the hydrocodone -the pain is constant -she wonders if it is inflammation.  -she has not tried turmeric. Her son-in-law gave her a bottle and she couldn't stomach it.   2) Fibromyalgia: -She has been almost nonfunctional due to the severity of her pain. It continues to be very severe.  -She would like to wean off the Gabapentin as she feels at risk of falls -she is taking Topamax BID. She felt this was helpful but made her unsteady on her feet.  -still using gabapentin and this is helping.  -she feels so fatigued that she can only have a shower every other day.   3) Osteoporosis: -husband asks whether she should get a Prolia shot.   4) Dizziness: -head was swimming -she felt like she was going to fall.  -worsening -feels like she is spinning, not the room  -She tried the Phenytoin for 2 days and did not feel benefit so stopped it.   -she feels the gabapentin aggravates this and she would love to wean off but knows it helps her.   -She feels very sleepy during the day.   -She has never tried Topirmate before.   5) Sore throat -developed stopping lamotrigine since not helping.   Prior history:  Her pain continues to be in her left eye, forehead. She has not tried any topicals for pain. She uses capsaicin for her back and knees.   Since last visit nothing we have tried has helped with her pain. She has tried to decrease her Gabapentin dose but then her pain increased so she has resumed taking 4 Gabapentin per day.   Her husband asks whether he should contact her surgeon regarding whether nerve may have become unclamped.   Discussed prolotherapy, trigger point injections, Baclofen, Clonazepam, Phenytoin as other options we could try,=.  Pain continues to be excruciating and she states she is not sure how much longer she can live like this.   Prior history: She has having some nausea,  lightheadedness and felt this may be related to her Gabapentin. She did not take any yesterday and  then felt more sever pain in the evening. She also gets electrical shooting across her jaw.   She does not feel that she has cluster headaches as the pain has been so constant. She feels that she is getting worse. The oxygen made her feel relaxed but it is not helping.   The Sarna lotion does help with the itching in her head.   She was willing to try the Amitriptyline but it makes her too sleepy in the morning.   Her teeth and left eye incredibly hurt.   She feels that the Gabapentin has been making her dizziness.   She asks about biofeedback.   The pain is 7/10 on average, similar to last time.   When the pain is severe it can make her depressed.   Pain Inventory Pain Right Now 3 My pain is dull and aching  In the last 24 hours, has pain interfered with the following? General activity 0 Relation with others 0 Enjoyment of life 8  What TIME of day is your pain at its worst? morning , daytime, evening, night, and varies Sleep (in general) Good    Family History  Problem Relation Age of Onset   Heart disease Mother    Heart disease Father    Stroke Father    Stroke Sister    Asthma Daughter    Colon cancer Neg Hx    Social History   Socioeconomic History   Marital status: Married    Spouse name: Not on file   Number of children: 2   Years of education: college   Highest education level: Not on file  Occupational History   Occupation: housewife    Employer: RETIRED  Tobacco Use   Smoking status: Never   Smokeless tobacco: Never  Vaping Use   Vaping Use: Never used  Substance and Sexual Activity   Alcohol use: No    Alcohol/week: 0.0 standard drinks of alcohol   Drug use: No   Sexual activity: Not on file  Other Topics Concern   Not on file  Social History Narrative   1 cup of Jasmine tea daily    Social Determinants of Health   Financial Resource  Strain: Not on file  Food Insecurity: Not on file  Transportation Needs: Not on file  Physical Activity: Not on file  Stress: Not on file  Social Connections: Not on file   Past Surgical History:  Procedure Laterality Date   CHOLECYSTECTOMY  03/2010   COLONOSCOPY  2009   Dr. Raford Pitcher    CYSTOSCOPY     Multiple Cystoscopies with stents or biopsy    ESOPHAGOGASTRODUODENOSCOPY  10/2013   EYE SURGERY     bilateral cataract removal   Gamma knife radiation  01/2018   for trigeminal neuralgia   REFRACTIVE SURGERY Bilateral 2022   RHIZOTOMY Left 04/28/2021   Procedure: Left Percutaneous trigeminal balloon rhizotomy;  Surgeon: Judith Part, MD;  Location: [facility];  Service: Neurosurgery;  Laterality: Left;   TONSILLECTOMY     Past Surgical History:  Procedure Laterality Date   CHOLECYSTECTOMY  03/2010   COLONOSCOPY  2009   Dr. Raford Pitcher    CYSTOSCOPY     Multiple Cystoscopies with stents or biopsy    ESOPHAGOGASTRODUODENOSCOPY  10/2013   EYE SURGERY     bilateral cataract removal   Gamma knife radiation  01/2018   for trigeminal neuralgia   REFRACTIVE SURGERY Bilateral 2022   RHIZOTOMY Left 04/28/2021   Procedure: Left Percutaneous trigeminal balloon  rhizotomy;  Surgeon: Judith Part, MD;  Location: [facility];  Service: Neurosurgery;  Laterality: Left;   TONSILLECTOMY     Past Medical History:  Diagnosis Date   Adenomatous colon polyp 03/24/2018   Allergy    Arthritis    Cataract    Chronic headaches    Depression    Fibromyalgia    Gallstones    GERD (gastroesophageal reflux disease)    Hyperlipemia    Hypertension    Interstitial cystitis    Lactose intolerance    LBBB (left bundle branch block)    Osteoarthritis    Osteopenia    Pneumonia    Thyroid disease    Tinnitus    Trigeminal neuralgia    Ht '5\' 3"'$  (1.6 m)   Wt 140 lb (63.5 kg)   BMI 24.80 kg/m   Opioid Risk Score:   Fall Risk Score:  `1  Depression screen PHQ 2/9      05/19/2022    1:11 PM 02/03/2022    1:29 PM 12/25/2021    2:01 PM 12/24/2021    1:11 PM 11/27/2021   10:09 AM 10/20/2021    1:08 PM 10/20/2021   11:16 AM  Depression screen PHQ 2/9  Decreased Interest 0 0 0 1 0 1 0  Down, Depressed, Hopeless 0 0 3 1 0 1 3  PHQ - 2 Score 0 0 3 2 0 2 3  Altered sleeping   0    1  Tired, decreased energy   0    2  Change in appetite   0    0  Feeling bad or failure about yourself    0    0  Trouble concentrating   0    0  Moving slowly or fidgety/restless   0    0  Suicidal thoughts   0    0  PHQ-9 Score   3    6  Difficult doing work/chores   Somewhat difficult    Somewhat difficult    Review of Systems  Constitutional: Negative.   HENT: Negative.    Eyes:  Positive for visual disturbance.  Respiratory: Negative.    Cardiovascular: Negative.   Gastrointestinal: Negative.   Endocrine: Negative.   Genitourinary: Negative.   Musculoskeletal: Negative.        Pain in left side of face  Skin: Negative.   Allergic/Immunologic: Negative.   Neurological:  Positive for dizziness, tremors, light-headedness and numbness.       Tingling   Hematological: Negative.   Psychiatric/Behavioral: Negative.    All other systems reviewed and are negative.      Objective:   Physical Exam Gen: no distress, normal appearing HEENT: oral mucosa pink and moist, NCAT Cardio: Reg rate Chest: normal effort, normal rate of breathing Abd: soft, non-distended Ext: no edema Psych: pleasant, normal affect Skin: intact Neuro: Alert and oriented x3    Assessment & Plan:  Mrs. Ormand is an 82 year old woman who presents for f/u of left sided trigeminal neuralgia and dizziness.    1) Sensory deafferation pain, left sided trigeminal neuralgia vs cluster headaches (she does have lacrimation and runny nose on that side). Diclofenac 3%, Gabapentin 5%, Lidocaine 5%, Menthol 1% compounded at [facility]:  apply 1-2 pumps three to four times per day as needed  Increase low  dose naltrexone to '3mg'$  HS -discussed her response to the stimulator She does have a history of migraines.  -Discussed that if she has to  be in bed all day due to her pain it may be beneficial for her husband to give her at least one hydrocodone per day to enable her to function -provided refill of Fentanyl patch as she will be leaving for her surgery in [place] soon and her husband plans to stay there for some time in case she has any surgical complications.  -reviewed Dr. Billey Chang note with her, discussed the plan for her visit to [facility] next October.  -ordered MRI Brain with and without contrast, discussed the plan for repeat MRI brain next October -discussed her follow-up with pain psychology.  -recommended weaning off Fentanyl, discussed stopping fentanyl because she is on 12.'5mg'$  today but she had tried this a couple of weeks ago and pain was so severe that she needed patch to be replaced.  -discussed that she is taking hydrocodone 4 tablets per day, and recommend to decrease this to 3 tabs per day for three days and then to keep decreasing the tab by once per day.  -discussed that skipping the morning dose of hydrocodone will at least allow her to sleep -recommended trying Frankincense and Myrrh essential oils mixed in coconut oil and applied to area of pain -recommended restarting Topamax -called [facility] to prescribe compounding gel refill -continue Gabapentin '100mg'$  up to 8 times a day, discussed her current schedule of takin -continue Fentanyl patch to 64mg and she is agreeable. Discussed that phenytoin, carbamazepine -discussed using Oragel multiple times per ay since this helps for an hour, use the topical cream intermittently in between -[facility] appointment for her with them. Unfortunately Dr. LVan Clineshas retired but was able to set her up with Dr. SChristin Bachwho specializes in migraines and trigeminal neuralgia on July 25th. Let  patient and husband know. They will mail her their address.  -restart 12.572m fentanyl patch as patient does not feel she can get through weekend with this level of pain -commended her for calling Dr. EiDavy Piques well to let him know how she is feeling -recommended taking 1 hydrocodone now for the severe pain, and discussed that the Fentanyl patch with take 12-24 hours to kick in -prescribed Narcan while on Fentanyl -discussed with April the neurology referral and she did specify Dr. WoJuel [facility]nd [facility] -discussed CBD oil, her son bought her some of this and she felt that it greatly helped  Recommend topical CBD oil- discussed its benefits in reducing inflammation, pain, insomnia, and anxiety, but she stopped because effect wore of.  -Discussed that CBD oil differs from marijuana in that it does not contain THC- the substance that causes euphoria.  -Discussed that it is made from the hemp plant.  -It has been used for thousands of years -preliminary research suggests that if may be able to shrink cancerous tumors, stop plaque formation in Alzheimer's Disease, and slow the progress of brain disease from concussions.  -Additional benefits that have been demonstrated in studies include improved nausea, indigestion, and brain health, and reduced seizures.  -In a survey 92% of patients who tried medical cannabis felt it improved symptoms such as chronic pain, arthritis, migraines, and cancer.   -Provided with a pain relief journal and discussed that it contains foods and lifestyle tips to naturally help to improve pain. Discussed that these lifestyle strategies are also very good for health unlike some medications which can have negative side effects. Discussed that the act of keeping a journal can  be therapeutic and helpful to realize patterns what helps to trigger and alleviate pain.   -discussed with Dr. Joaquim Nam trigeminal deafferation pain, discussed the  difference between this and trigeminal neuralgia with patient and husband, discussed motor cortex stimulation. -discussed whether or not to continue fentanyl patch, husband does feel that the IV fentanyl  -encouraged following with Dr. Sweet/Dr. Sabra Heck at [facility] to try procedure recommended by D. Ostegard.  -discussed mechanism of action of low dose naltrexone as an opioid receptor antagonist which stimulates your body's production of its own natural endogenous opioids, helping to decrease pain. Discussed that it can also decrease T cell response and thus be helpful in decreasing inflammation, and symptoms of brain fog, fatigue, anxiety, depression, and allergies. Discussed that this medication needs to be compounded at a compounding pharmacy and can more expensive. Discussed that I usually start at '1mg'$  and if this is not providing enough relief then I titrate upward on a monthly basis.   -stop lamotrigine as it is making her dizzy without benefit -reviewed patient's medical notes -discussed her progress with getting a referral to [facility] -recommended going to ED given darkening of skin, worsening eye closure, finding of cyst, dizziness to receive prompt neurosurgical eval regarding if surgery is necessary, and also for pain management given worsening pain refractory to all treatments and resulting suicidality. Reviewed ED doc notes- he discussed with on-call neurology and NSGY and determined that patient could be discharged with outpatient follow-up. IV fentanyl was administered with relief to patient. She was discharged on fentanyl patch which has not provided relief. She is getting 3mg- no side effects- discussed increasing to 275m and her husband is in agreement. Prior auth completed and approved. Discussed with husband starting patch on Satruday after she has worn 1212mpatch for 72 hours -encouraged follow-up with Dr. OstJoaquim Namusband let me know appointment has been set up 1/18.   -recommended follow-up with Dr. 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  -Discussed that turmeric  -discussed steroid taper. -continue savella, husband feels like it is helping with her suicidal thoughts. Increase to '25mg'$ .  -continue hydrocodone.  -discuss mouth guard with dentist.  -d/c topamax -she does have another procedure scheduled. -discussed benefits of infrared light.  -Continue Norco '5mg'$  TID PRN -follow-up monthly with EunZella Balld with me in February, placed on waitlist for earlier appointment with me, advised phone visits/MyChart are a great way to communicate in the interim -discussed that she can take an additional one or two tylenol for breakthrough pain -recommended checking CMP for liver enzymes with next labs -prescribed NAC '600mg'$  BID to protect the liver while taking tylenol, discussed its health benefits for mitochondrial function -d/c Baclofen  -f/u with Dr. OstJoaquim Namd Dr. EicDavy [id number] -discussed retrying carbamazepine to see if it is effective for her. It is $89 for her, so we discussed trying Lamictal instead.  -encouraged anti-inflammatory diet.  -she had sinusitis that was viral and was treated with antibiotics. This was in the early 90s61she symptoms resolved after 2 months. She uses to get sinus infections and saline rinse.  -She has had multiple root canals.  -Discontinue 100% oxygen via facemask as this was not helpful.  -Discussed that unfortunately [employer] PNS  is not indicated for craniofacial pain. -Amitriptyline and Lyrica did not help.  -Continue Cymbalta '60mg'$ .  -Topiramate helped but caused dizziness.  -Provided with hand out with  information regarding trigeminal neuralgia -Failed Penytoin.  -trial lidocaine patch -She would like to try biofeedback. -reviewed neurology note.  -referred to Dr. Juel Burrow.  -will try trigger point injections next visit.  -Continue Ketamine 10%, Baclofen 2%, Cyclobenzaprine 2%, Ketoprofen 10%, Gabapentin 6%, Bupivacaine 1%, Amitryptiline 5% to apply to painful areas- using 3-4 times per day.  -Can consider Meloxicam if Phenytoin does not help.  -Discussed Qutenza as an option for neuropathic pain control. Discussed that this is a capsaicin patch, stronger than capsaicin cream. Discussed that it is currently approved for diabetic peripheral neuropathy and post-herpetic neuralgia, but that it has also shown benefit in treating other forms of neuropathy. Provided patient with link to site to learn more about the patch: [email or web address]. Discussed that the patch would be placed in office and benefits usually last 3 months. Discussed that unintended exposure to capsaicin can cause severe irritation of eyes, mucous membranes, respiratory tract, and skin, but that Qutenza is a local treatment and does not have the systemic side effects of other nerve medications. Discussed that there may be pain, itching, erythema, and decreased sensory function associated with the application of Qutenza. Side effects usually subside within 1 week. A cold pack of analgesic medications can help with these side effects. Blood pressure can also be increased due to pain associated with administration of the patch.  -she cut down on her gabapentin due to her dizziness    Turmeric to reduce inflammation--can be used in cooking or taken as a supplement.  Benefits of turmeric:  -Highly anti-inflammatory  -Increases antioxidants  -Improves memory, attention, brain disease  -Lowers risk of heart disease  -May help prevent cancer  -Decreases pain  -Alleviates depression  -Delays aging and decreases  risk of chronic disease  -Consume with black pepper to increase absorption    Turmeric Milk Recipe:  1 cup milk  1 tsp turmeric  1 tsp cinnamon  1 tsp grated ginger (optional)  Black pepper (boosts the anti-inflammatory properties of turmeric).  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  -she is a good metabolizer of amitriptyline and has tolerated nortrypilline in the past, but without much benefit.    3) CKD: encouraged 6-8 glasses of water per day. She states that she had AKI with Ibuprofen in the past. Discussed checking Cr level next visit if we are considering Meloxicam -Discussed that Nucynta, and other pain medications, may stay in her bloodstream longer given her CKD   4) Hypernatremia: encouraged hydration   5) General health and pain: provided with list of healthy foods that are both nutritious and fight pain.   6) Depressed: Amitriptyline did help with this but she could not tolerate the drowsiness. Encouraged focusing on the factors that she can control, such as anti-inflammatory diet.   7)  Fibromyalgia: Discussed goal to wean steroids prior to surgery. Continue '1mg'$  daily. She is on that intermittently. She started this again about a month ago. She has been on this about a year. Continue Savella -discussed benefits of cold water showers.  -continue gabapentin.  -high dose vitamin D  sent  8) Dizziness: -discussed that this could be from the combination of gabapentin and baclofen.  -discussed that all the medications we are trying for pain may worsen the dizziness  9) Constipation:  -Provided list of following foods that help with constipation and highlighted a few: 1) prunes- contain high amounts of fiber.  2) apples- has a form of dietary fiber called pectin that accelerates stool movement and increases beneficial gut bacteria 3) pears- in addition to fiber, also high in fructose and sorbitol which have laxative effect 4) figs- contain an enzyme ficin which helps to speed colonic transit 5) kiwis- contain an enzyme actinidin that improves gut motility and reduces constipation 6) oranges- rich in pectin (like apples) 7) grapefruits- contain a flavanol naringenin which has a laxative effect 8) vegetables- rich in fiber and also great sources of folate, vitamin C, and K 9) artichoke- high in inulin, prebiotic great for the microbiome 10) chicory- increases stool frequency and softness (can be added to coffee) 11) rhubarb- laxative effect 12) sweet potato- high fiber 13) beans, peas, and lentils- contain both soluble and insoluble fiber 14) chia seeds- improves intestinal health and gut flora 15) flaxseeds- laxative effect 16) whole grain rye bread- high in fiber 17) oat bran- high in soluble and insoluble fiber 18) kefir- softens stools -recommended to try at least one of these foods every day.  -drink 6-8 glasses of water per day -walk regularly, especially after meals.   10) Osteopenia - was tapered off the prednisone -discussed that currently her pain may be more of a risk to her than osteopenia given her suicidal ideation from her pain -discussed risk of fall with the medications that make her dizziness.   11) Daytime sleepiness -discussed that this is secondary to her medications.  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   >40 minutes spent in discussion of her trigeminal neuralgia, dry macular degneration, f/u with a specialist, daytime sleepiness and fatigue, worsening pain with poor weather, prescribing high dose vitamin D, weaning gabapentin

## 2022-07-21 NOTE — Addendum Note (Signed)
Addended by: Izora Ribas on: 07/21/2022 02:15 PM   Modules accepted: Level of Service

## 2022-07-21 NOTE — Patient Instructions (Signed)
Infrared lamp- Joov

## 2022-08-03 DIAGNOSIS — H43813 Vitreous degeneration, bilateral: Secondary | ICD-10-CM | POA: Diagnosis not present

## 2022-08-03 DIAGNOSIS — H35033 Hypertensive retinopathy, bilateral: Secondary | ICD-10-CM | POA: Diagnosis not present

## 2022-08-03 DIAGNOSIS — H353123 Nonexudative age-related macular degeneration, left eye, advanced atrophic without subfoveal involvement: Secondary | ICD-10-CM | POA: Diagnosis not present

## 2022-08-03 DIAGNOSIS — H353112 Nonexudative age-related macular degeneration, right eye, intermediate dry stage: Secondary | ICD-10-CM | POA: Diagnosis not present

## 2022-08-04 DIAGNOSIS — R519 Headache, unspecified: Secondary | ICD-10-CM | POA: Diagnosis not present

## 2022-08-04 DIAGNOSIS — M25569 Pain in unspecified knee: Secondary | ICD-10-CM | POA: Diagnosis not present

## 2022-08-06 DIAGNOSIS — R519 Headache, unspecified: Secondary | ICD-10-CM | POA: Diagnosis not present

## 2022-08-06 DIAGNOSIS — M25569 Pain in unspecified knee: Secondary | ICD-10-CM | POA: Diagnosis not present

## 2022-08-10 DIAGNOSIS — R519 Headache, unspecified: Secondary | ICD-10-CM | POA: Diagnosis not present

## 2022-08-10 DIAGNOSIS — M25569 Pain in unspecified knee: Secondary | ICD-10-CM | POA: Diagnosis not present

## 2022-08-13 DIAGNOSIS — R519 Headache, unspecified: Secondary | ICD-10-CM | POA: Diagnosis not present

## 2022-08-13 DIAGNOSIS — M25569 Pain in unspecified knee: Secondary | ICD-10-CM | POA: Diagnosis not present

## 2022-08-14 ENCOUNTER — Other Ambulatory Visit: Payer: Self-pay

## 2022-08-14 DIAGNOSIS — M170 Bilateral primary osteoarthritis of knee: Secondary | ICD-10-CM

## 2022-08-17 DIAGNOSIS — R519 Headache, unspecified: Secondary | ICD-10-CM | POA: Diagnosis not present

## 2022-08-17 DIAGNOSIS — M25569 Pain in unspecified knee: Secondary | ICD-10-CM | POA: Diagnosis not present

## 2022-08-19 DIAGNOSIS — G509 Disorder of trigeminal nerve, unspecified: Secondary | ICD-10-CM | POA: Diagnosis not present

## 2022-08-19 DIAGNOSIS — Z9682 Presence of neurostimulator: Secondary | ICD-10-CM | POA: Diagnosis not present

## 2022-08-25 DIAGNOSIS — M25569 Pain in unspecified knee: Secondary | ICD-10-CM | POA: Diagnosis not present

## 2022-08-25 DIAGNOSIS — R519 Headache, unspecified: Secondary | ICD-10-CM | POA: Diagnosis not present

## 2022-08-27 DIAGNOSIS — R519 Headache, unspecified: Secondary | ICD-10-CM | POA: Diagnosis not present

## 2022-08-27 DIAGNOSIS — M25569 Pain in unspecified knee: Secondary | ICD-10-CM | POA: Diagnosis not present

## 2022-08-28 ENCOUNTER — Encounter: Payer: Self-pay | Admitting: Orthopedic Surgery

## 2022-08-28 ENCOUNTER — Ambulatory Visit (INDEPENDENT_AMBULATORY_CARE_PROVIDER_SITE_OTHER): Payer: Medicare Other | Admitting: Orthopedic Surgery

## 2022-08-28 ENCOUNTER — Ambulatory Visit: Payer: Medicare Other | Admitting: Orthopedic Surgery

## 2022-08-28 DIAGNOSIS — M1711 Unilateral primary osteoarthritis, right knee: Secondary | ICD-10-CM | POA: Diagnosis not present

## 2022-08-28 DIAGNOSIS — M170 Bilateral primary osteoarthritis of knee: Secondary | ICD-10-CM

## 2022-08-28 DIAGNOSIS — M1712 Unilateral primary osteoarthritis, left knee: Secondary | ICD-10-CM

## 2022-08-28 MED ORDER — HYALURONAN 88 MG/4ML IX SOSY
88.0000 mg | PREFILLED_SYRINGE | INTRA_ARTICULAR | Status: AC | PRN
  Administered 2022-08-28: 88 mg via INTRA_ARTICULAR

## 2022-08-28 MED ORDER — LIDOCAINE HCL 1 % IJ SOLN
5.0000 mL | INTRAMUSCULAR | Status: AC | PRN
  Administered 2022-08-28: 5 mL

## 2022-08-28 NOTE — Progress Notes (Signed)
   Procedure Note  Patient: [name]             Date of Birth: [birth year]           MRN: [id number]             Visit Date: 08/28/2022  Procedures: Visit Diagnoses:  1. Primary osteoarthritis of both knees     Large Joint Inj: R knee on 08/28/2022 6:32 PM Indications: diagnostic evaluation, joint swelling and pain Details: 18 G 1.5 in needle, superolateral approach  Arthrogram: No  Medications: 5 mL lidocaine 1 %; 88 mg Hyaluronan 88 MG/4ML Outcome: tolerated well, no immediate complications Procedure, treatment alternatives, risks and benefits explained, specific risks discussed. Consent was given by the patient. Immediately prior to procedure a time out was called to verify the correct patient, procedure, equipment, support staff and site/side marked as required. Patient was prepped and draped in the usual sterile fashion.    Large Joint Inj: L knee on 08/28/2022 6:33 PM Indications: diagnostic evaluation, joint swelling and pain Details: 18 G 1.5 in needle, superolateral approach  Arthrogram: No  Medications: 5 mL lidocaine 1 %; 88 mg Hyaluronan 88 MG/4ML Outcome: tolerated well, no immediate complications Procedure, treatment alternatives, risks and benefits explained, specific risks discussed. Consent was given by the patient. Immediately prior to procedure a time out was called to verify the correct patient, procedure, equipment, support staff and site/side marked as required. Patient was prepped and draped in the usual sterile fashion.     Lot [id number]

## 2022-09-01 DIAGNOSIS — M25569 Pain in unspecified knee: Secondary | ICD-10-CM | POA: Diagnosis not present

## 2022-09-01 DIAGNOSIS — R519 Headache, unspecified: Secondary | ICD-10-CM | POA: Diagnosis not present

## 2022-09-01 DIAGNOSIS — H353123 Nonexudative age-related macular degeneration, left eye, advanced atrophic without subfoveal involvement: Secondary | ICD-10-CM | POA: Diagnosis not present

## 2022-09-01 DIAGNOSIS — H35033 Hypertensive retinopathy, bilateral: Secondary | ICD-10-CM | POA: Diagnosis not present

## 2022-09-01 DIAGNOSIS — H353112 Nonexudative age-related macular degeneration, right eye, intermediate dry stage: Secondary | ICD-10-CM | POA: Diagnosis not present

## 2022-09-01 DIAGNOSIS — H43813 Vitreous degeneration, bilateral: Secondary | ICD-10-CM | POA: Diagnosis not present

## 2022-09-02 ENCOUNTER — Ambulatory Visit: Payer: Medicare Other | Admitting: Orthopedic Surgery

## 2022-09-03 DIAGNOSIS — R519 Headache, unspecified: Secondary | ICD-10-CM | POA: Diagnosis not present

## 2022-09-03 DIAGNOSIS — M25569 Pain in unspecified knee: Secondary | ICD-10-CM | POA: Diagnosis not present

## 2022-09-08 DIAGNOSIS — M25569 Pain in unspecified knee: Secondary | ICD-10-CM | POA: Diagnosis not present

## 2022-09-08 DIAGNOSIS — R519 Headache, unspecified: Secondary | ICD-10-CM | POA: Diagnosis not present

## 2022-09-10 DIAGNOSIS — R519 Headache, unspecified: Secondary | ICD-10-CM | POA: Diagnosis not present

## 2022-09-10 DIAGNOSIS — M25569 Pain in unspecified knee: Secondary | ICD-10-CM | POA: Diagnosis not present

## 2022-09-11 DIAGNOSIS — M810 Age-related osteoporosis without current pathological fracture: Secondary | ICD-10-CM | POA: Diagnosis not present

## 2022-09-15 ENCOUNTER — Encounter: Payer: Self-pay | Admitting: Physical Medicine and Rehabilitation

## 2022-09-15 ENCOUNTER — Encounter
Payer: Medicare Other | Attending: Physical Medicine and Rehabilitation | Admitting: Physical Medicine and Rehabilitation

## 2022-09-15 VITALS — BP 170/90 | HR 70 | Ht 63.0 in | Wt 139.0 lb

## 2022-09-15 DIAGNOSIS — M25569 Pain in unspecified knee: Secondary | ICD-10-CM | POA: Diagnosis not present

## 2022-09-15 DIAGNOSIS — G500 Trigeminal neuralgia: Secondary | ICD-10-CM | POA: Diagnosis not present

## 2022-09-15 DIAGNOSIS — R519 Headache, unspecified: Secondary | ICD-10-CM | POA: Diagnosis not present

## 2022-09-15 NOTE — Patient Instructions (Signed)
-  Advised regarding healthy foods that can help lower blood pressure and provided with a list: 1) citrus foods- high in vitamins and minerals 2) salmon and other fatty fish - reduces inflammation and oxylipins 3) swiss chard (leafy green)- high level of nitrates 4) pumpkin seeds- one of the best natural sources of magnesium 5) Beans and lentils- high in fiber, magnesium, and potassium 6) Berries- high in flavonoids 7) Amaranth (whole grain, can be cooked similarly to rice and oats)- high in magnesium and fiber 8) Pistachios- even more effective at reducing BP than other nuts 9) Carrots- high in phenolic compounds that relax blood vessels and reduce inflammation 10) Celery- contain phthalides that relax tissues of arterial walls 11) Tomatoes- can also improve cholesterol and reduce risk of heart disease 12) Broccoli- good source of magnesium, calcium, and potassium 13) Greek yogurt: high in potassium and calcium 14) Herbs and spices: Celery seed, cilantro, saffron, lemongrass, black cumin, ginseng, cinnamon, cardamom, sweet basil, and ginger 15) Chia and flax seeds- also help to lower cholesterol and blood sugar 16) Beets- high levels of nitrates that relax blood vessels  17) spinach and bananas- high in potassium  -Provided lise of supplements that can help with hypertension:  1) magnesium: one high quality brand is [employer] since it contains all 7 types of magnesium, otherwise over the counter magnesium gluconate 400mg is a good option 2) B vitamins 3) vitamin D 4) potassium 5) CoQ10 6) L-arginine 7) Vitamin C 8) Beetroot -Educated that goal BP is 120/80. -Made goal to incorporate some of the above foods into diet.   

## 2022-09-15 NOTE — Progress Notes (Signed)
Subjective:    Patient ID: [name], female    DOB: 07/24/1940, 82 y.o.   MRN: [id number]  HPI  [name] presents today for f/u of severe trigeminal neuralgia.   1) Severe trigeminal neuralgia: -today pain is 2/10 -has had stimulator turned off since last night -was able to enjoy her birthday -she did well for 3 weeks but recently pain has been around 8-10 -when pain is very severe it makes her eye hurt -the way her stimulator is set it makes her nose hurt She is going back to [facility] on April 10th -she is managing with the gabapentin and had to take 2 extra yesterday and it did not make her sleepier  -pain fluctuates  -she went to [place] last week with her children.  -she keeps a journal of when she changes the program and what her pain level is in her teeth, eyes, and cheek. She put in not to use the teeth program -she said if they could hit the nerve between the two teeth that would be helpful -they have her an 11 and 12 in that region,  -she cannot get the right level of stimulation. Before she left she decreased it by 50%. Now she is on a cheat program and the lowest amount. It is helping the teeth but hurting the eye.  -Dr. Lebron Quam had a baby on 11/14 and she is not coming back until January.  -she talked to the representative -last night she turned off the stimulator -she slept almost day today and did not want to wake up for supper which was rare for her -she is having a difficult time weaning off the hydrocodone.  -she has been off of it in two days -she is expecting a return call from Dr. Billey Chang team soon to discuss wean off fentanyl -drove up on Tuesday and on Wednesday was supposed to have an appointment with pain psychologist. When they got there they were told that appointment had been changed to virtual. The next day they had a more sophisticated MRI. [name] did moving in MRI so MRI was not as helpful. The next day they saw Dr. Lebron Quam and discussed several  procedural options and then went back to the motor stimulator- that is scheduled on October 16th.  -she recently went on vacation and had to spend the majority of the vacation in bed due to the severity of her pain -she stopped using the intranasal ketamine because although it helped initially, it appeared to make her pain worse later -she asks if she can wean off some medication as she had a recent appointment with Dr. Lebron Quam and was told she may have a lot of postoperative pain if she is taking so much pain medication. She would still like to do this and also experiences sedation from her medication. She asks how she would go about weaning her medications -she asks if she should wean off the gabapentin since this makes her so sleepy She asks if she should restart Topamax. -she was encouraged by her phone visit with Dr. Lebron Quam and plans to pursue surgery. She is going to [facility] next week for Brain MRI, neuropsych eval, and to discuss the risks and benefits of the procedure with Dr. Lebron Quam -pain was so severe in the past that she expressed suicidality to her husband [name].  -she feels that none of her medications are helpful but she knows that when she tries to wean them her pain worsens.  -she asks about refill  of the compounding cream -her husband asks about referral to ketamine clinic for depression rather than pain -she went back to the dentist because it was urting between two teeth and he said there is nothing wrong with those teeth and it is the trigeminal nerve When she took a shower that usually seems to help with but was migrating around her face.  -appointment with neurology in July -Dr. Lebron Quam cancelled the zoom call in April but this was rescheduled to July.  -she finds that only the gabapentin helps -she is not sure if the fentanyl helps When she is feeling really bad she tries to take another one but she gets very sleepy and dizzy.  -she is taking 8 a day of 100mg  per day- she takes  two gabapentin and tylenol and hydrocodone in the morning. She takes another two at breakfast around 11, another 2 at lunch around 1:30pm.  -she had a recent root canal and pain has become much more severe after that -her pain is in the two teeth next to where she had the root canal -Oragel heps for a bout an hour.  -she tried increasing her Gabapentin to 300mg  TID but she could not tolerate this. It made her too sleepy.  -she contacted Dr. Davy Pique and he recommended she pause wean of Fentanyl patch and I agree with this -she took last patch last night. She still has 12.31mcg patch available -her husband asks whether she should take a higher dose of gabapentin -the fentanyl patch makes her very sleepy and so allows her to sleep. It has also made her dizzier.  -the 38mcg fentanyl patch did not provide more relief- she would like to wean off. Weaned to 25, then 12.94mcg, then off this week -referral was to [facility] and not to Dr. Juel Burrow she states and they need a new referral send to Dr. Van Clines -she is using the compound cream and this helps- she uses this twice per day -capsaicin made it burn  -lamotrigine makes her too sleepy and dizzy to come down the stairs by herself and she does not feel comfortable going to the shower -left eye more open than usual. -she does not feel she is getting relief from the fentanyl patch -she feels that the procedure at [facility] is a last resort.  -Given darkening of skin and worsening eye lid closure, MRI brain stat obtained and shows new cyst that could be post-surgical. Recommended she go to ED given worsening dizziness and facial swelling as well. ED physician discussed case with NSGY and neurology and discharged home recommending follow-up with Dr. Gean Quint and Dr. Merceda Elks. She was given IV Fentanyl in the ED which helped and discharged on 68mcg Fentanyl patch, which has not helped.  -discussed increasing Fentanyl patch to 72mcg since she has  not experienced any negative side effects, no respiratory depression, and her husband is agreeable. I have sent in new script to start Saturday, but insurance required prior auth, which was approved.  -tonight would be the third tay of the 73mcg Fentanyl patch -she is still taking the Gabapentin -she feels that when she lays down she is spinning.  -eye continues to appear more closed.  -Husband has called Dr. Dionne Ano and Dr. Earlie Raveling office to scheduled follow-up appointments and has appointments with both tomorrow.  -Will run out of Fentanyl patch by Sunday -pain is worsening and nothing is providing relief -she asks whether she should go to ED -she has also noted dizziness and is  not sure if this is from being in the MRI or from the [facility].  -morphine was not effective and caused itching, sarna lotion did help wit this.  -hydrocodone has been most effective thus far -nucynta was not effective but she is not sure whether she gave it a fair trial -she has month appointments scheduled with Zella Ball and f/u with me in Feb -she used the Norco last night and this morning with good benefit and has started to feel the pain return this afternoon -she did find benefit from the compound cream- she continues to use this.  -She continues to have a sharp shooting pain doing around her eyelid and the tip teeth and in the top of her head.  -has a radiofrequency ablation treatment that helped but not completely -she is not having as much sharp knife-like pin like in her eye Her top teeth in her left law have been hurting really badly.  -she now takes the baclofen with the gabapentin and they seem to work well together.  -she had surgical procedure and feels worse -yesterday she took a half a tablet but this morning she took a whole one. She does fine with the hydrocodone but a whole one makes her too sleepy.  -she is taking 2 100mg  Gabapentin three times per day but it makes her dizzy and sleepy so she  cannot funciton -the cold worsens her pain.  -The IV fentanyl did help with the pain.  -her husband feels she should get one more dose of the 25mg  fentanyl. -she did get one week benefit from the steroid injection.  -she is doing terribly -she is miserable -she feels depressed due to the pain -she has considered taking the whole bottle of hydrocodone because because she can't take this pain anymore.  -she started taking the Gabapentin 300mg  TID.  -she does find relief from the hydrocodone -the pain is constant -she wonders if it is inflammation.  -she has not tried turmeric. Her son-in-law gave her a bottle and she couldn't stomach it.   2) Fibromyalgia: -She has been almost nonfunctional due to the severity of her pain. It continues to be very severe.  -She would like to wean off the Gabapentin as she feels at risk of falls -she is taking Topamax BID. She felt this was helpful but made her unsteady on her feet.  -still using gabapentin and this is helping.  -she feels so fatigued that she can only have a shower every other day.   3) Osteoporosis: -husband asks whether she should get a Prolia shot.   4) Dizziness: -head was swimming -she felt like she was going to fall.  -worsening -feels like she is spinning, not the room  -She tried the Phenytoin for 2 days and did not feel benefit so stopped it.   -she feels the gabapentin aggravates this and she would love to wean off but knows it helps her.   -She feels very sleepy during the day.   -She has never tried Topirmate before.   5) Sore throat -developed stopping lamotrigine since not helping.   Prior history:  Her pain continues to be in her left eye, forehead. She has not tried any topicals for pain. She uses capsaicin for her back and knees.   Since last visit nothing we have tried has helped with her pain. She has tried to decrease her Gabapentin dose but then her pain increased so she has resumed taking 4 Gabapentin  per day.   Her  husband asks whether he should contact her surgeon regarding whether nerve may have become unclamped.   Discussed prolotherapy, trigger point injections, Baclofen, Clonazepam, Phenytoin as other options we could try,=.  Pain continues to be excruciating and she states she is not sure how much longer she can live like this.   Prior history: She has having some nausea, lightheadedness and felt this may be related to her Gabapentin. She did not take any yesterday and then felt more sever pain in the evening. She also gets electrical shooting across her jaw.   She does not feel that she has cluster headaches as the pain has been so constant. She feels that she is getting worse. The oxygen made her feel relaxed but it is not helping.   The Sarna lotion does help with the itching in her head.   She was willing to try the Amitriptyline but it makes her too sleepy in the morning.   Her teeth and left eye incredibly hurt.   She feels that the Gabapentin has been making her dizziness.   She asks about biofeedback.   The pain is 7/10 on average, similar to last time.   When the pain is severe it can make her depressed.   Pain Inventory Average pain 5 Pain Right Now 10 My pain is constant, sharp, burning, dull, stabbing, tingling, and aching  In the last 24 hours, has pain interfered with the following? General activity 10 Relation with others 0 Enjoyment of life 0  What TIME of day is your pain at its worst? morning , daytime, evening, night, and varies Sleep (in general) Good    Family History  Problem Relation Age of Onset   Heart disease Mother    Heart disease Father    Stroke Father    Stroke Sister    Asthma Daughter    Colon cancer Neg Hx    Social History   Socioeconomic History   Marital status: Married    Spouse name: Not on file   Number of children: 2   Years of education: college   Highest education level: Not on file  Occupational History    Occupation: housewife    Employer: RETIRED  Tobacco Use   Smoking status: Never   Smokeless tobacco: Never  Vaping Use   Vaping Use: Never used  Substance and Sexual Activity   Alcohol use: No    Alcohol/week: 0.0 standard drinks of alcohol   Drug use: No   Sexual activity: Not on file  Other Topics Concern   Not on file  Social History Narrative   1 cup of Jasmine tea daily    Social Determinants of Health   Financial Resource Strain: Not on file  Food Insecurity: Not on file  Transportation Needs: Not on file  Physical Activity: Not on file  Stress: Not on file  Social Connections: Not on file   Past Surgical History:  Procedure Laterality Date   CHOLECYSTECTOMY  03/2010   COLONOSCOPY  2009   Dr. Raford Pitcher    CYSTOSCOPY     Multiple Cystoscopies with stents or biopsy    ESOPHAGOGASTRODUODENOSCOPY  10/2013   EYE SURGERY     bilateral cataract removal   Gamma knife radiation  01/2018   for trigeminal neuralgia   REFRACTIVE SURGERY Bilateral 2022   RHIZOTOMY Left 04/28/2021   Procedure: Left Percutaneous trigeminal balloon rhizotomy;  Surgeon: Judith Part, MD;  Location: [facility];  Service: Neurosurgery;  Laterality: Left;   TONSILLECTOMY  Past Surgical History:  Procedure Laterality Date   CHOLECYSTECTOMY  03/2010   COLONOSCOPY  2009   Dr. Raford Pitcher    CYSTOSCOPY     Multiple Cystoscopies with stents or biopsy    ESOPHAGOGASTRODUODENOSCOPY  10/2013   EYE SURGERY     bilateral cataract removal   Gamma knife radiation  01/2018   for trigeminal neuralgia   REFRACTIVE SURGERY Bilateral 2022   RHIZOTOMY Left 04/28/2021   Procedure: Left Percutaneous trigeminal balloon rhizotomy;  Surgeon: Judith Part, MD;  Location: [facility];  Service: Neurosurgery;  Laterality: Left;   TONSILLECTOMY     Past Medical History:  Diagnosis Date   Adenomatous colon polyp 03/24/2018   Allergy    Arthritis    Cataract    Chronic headaches    Depression     Fibromyalgia    Gallstones    GERD (gastroesophageal reflux disease)    Hyperlipemia    Hypertension    Interstitial cystitis    Lactose intolerance    LBBB (left bundle branch block)    Osteoarthritis    Osteopenia    Pneumonia    Thyroid disease    Tinnitus    Trigeminal neuralgia    There were no vitals taken for this visit.  Opioid Risk Score:   Fall Risk Score:  `1  Depression screen PHQ 2/9     07/21/2022    1:38 PM 05/19/2022    1:11 PM 02/03/2022    1:29 PM 12/25/2021    2:01 PM 12/24/2021    1:11 PM 11/27/2021   10:09 AM 10/20/2021    1:08 PM  Depression screen PHQ 2/9  Decreased Interest 1 0 0 0 1 0 1  Down, Depressed, Hopeless 1 0 0 3 1 0 1  PHQ - 2 Score 2 0 0 3 2 0 2  Altered sleeping    0     Tired, decreased energy    0     Change in appetite    0     Feeling bad or failure about yourself     0     Trouble concentrating    0     Moving slowly or fidgety/restless    0     Suicidal thoughts    0     PHQ-9 Score    3     Difficult doing work/chores    Somewhat difficult       Review of Systems  Constitutional: Negative.   HENT: Negative.    Eyes:  Positive for visual disturbance.  Respiratory: Negative.    Cardiovascular: Negative.   Gastrointestinal: Negative.   Endocrine: Negative.   Genitourinary: Negative.   Musculoskeletal: Negative.        Pain in left side of face  Skin: Negative.   Allergic/Immunologic: Negative.   Neurological:  Positive for dizziness, tremors, light-headedness and numbness.       Tingling   Hematological: Negative.   Psychiatric/Behavioral: Negative.    All other systems reviewed and are negative.      Objective:   Physical Exam Gen: no distress, normal appearing, BP 170/90. Weight 139 lbs HEENT: oral mucosa pink and moist, NCAT Cardio: Reg rate Chest: normal effort, normal rate of breathing Abd: soft, non-distended Ext: no edema Psych: pleasant, normal affect Skin: intact Neuro: Alert and oriented x3     Assessment & Plan:  Mrs. Fischel is an 82 year old woman who presents for f/u of left sided trigeminal neuralgia and dizziness.  1) Sensory deafferation pain, left sided trigeminal neuralgia vs cluster headaches (she does have lacrimation and runny nose on that side). Diclofenac 3%, Gabapentin 5%, Lidocaine 5%, Menthol 1% compounded at [facility]:  apply 1-2 pumps three to four times per day as needed  -discussed response to her stimulator -discussed doubling the gabapentin when pain is severe Increase low dose naltrexone to 3mg  HS -discussed her response to the stimulator She does have a history of migraines.  -Discussed that if she has to be in bed all day due to her pain it may be beneficial for her husband to give her at least one hydrocodone per day to enable her to function -provided refill of Fentanyl patch as she will be leaving for her surgery in [place] soon and her husband plans to stay there for some time in case she has any surgical complications.  -reviewed Dr. Billey Chang note with her, discussed the plan for her visit to [facility] next October.  -ordered MRI Brain with and without contrast, discussed the plan for repeat MRI brain next October -discussed her follow-up with pain psychology.  -recommended weaning off Fentanyl, discussed stopping fentanyl because she is on 12.5mg  today but she had tried this a couple of weeks ago and pain was so severe that she needed patch to be replaced.  -discussed that she is taking hydrocodone 4 tablets per day, and recommend to decrease this to 3 tabs per day for three days and then to keep decreasing the tab by once per day.  -discussed that skipping the morning dose of hydrocodone will at least allow her to sleep -recommended trying Frankincense and Myrrh essential oils mixed in coconut oil and applied to area of pain -recommended restarting Topamax -called [facility] to prescribe compounding gel refill -continue Gabapentin  100mg  up to 8 times a day, discussed her current schedule of takin -continue Fentanyl patch to 72mcg and she is agreeable. Discussed that phenytoin, carbamazepine -discussed using Oragel multiple times per ay since this helps for an hour, use the topical cream intermittently in between -continue follow-up with [facility] to set up an appointment for her with them. Unfortunately Dr. Van Clines has retired but was able to set her up with Dr. Christin Bach who specializes in migraines and trigeminal neuralgia on July 25th. Let patient and husband know. They will mail her their address.  -restart 12.72mch fentanyl patch as patient does not feel she can get through weekend with this level of pain -commended her for calling Dr. Davy Pique as well to let him know how she is feeling -recommended taking 1 hydrocodone now for the severe pain, and discussed that the Fentanyl patch with take 12-24 hours to kick in -prescribed Narcan while on Fentanyl -discussed with April the neurology referral and she did specify Dr. Juel Burrow and spoke with the [facility] department -discussed CBD oil, her son bought her some of this and she felt that it greatly helped  Recommend topical CBD oil- discussed its benefits in reducing inflammation, pain, insomnia, and anxiety, but she stopped because effect wore of.  -Discussed that CBD oil differs from marijuana in that it does not contain THC- the substance that causes euphoria.  -Discussed that it is made from the hemp plant.  -It has been used for thousands of years -preliminary research suggests that if may be able to shrink cancerous tumors, stop plaque formation in Alzheimer's Disease, and slow the progress of brain disease from concussions.  -Additional benefits that have been demonstrated  in studies include improved nausea, indigestion, and brain health, and reduced seizures.  -In a survey 92% of patients who tried medical cannabis felt it improved symptoms such as  chronic pain, arthritis, migraines, and cancer.   -Provided with a pain relief journal and discussed that it contains foods and lifestyle tips to naturally help to improve pain. Discussed that these lifestyle strategies are also very good for health unlike some medications which can have negative side effects. Discussed that the act of keeping a journal can be therapeutic and helpful to realize patterns what helps to trigger and alleviate pain.   -discussed with Dr. Joaquim Nam trigeminal deafferation pain, discussed the difference between this and trigeminal neuralgia with patient and husband, discussed motor cortex stimulation. -discussed whether or not to continue fentanyl patch, husband does feel that the IV fentanyl  -encouraged following with Dr. Sweet/Dr. Sabra Heck at [facility] to try procedure recommended by D. Ostegard.  -discussed mechanism of action of low dose naltrexone as an opioid receptor antagonist which stimulates your body's production of its own natural endogenous opioids, helping to decrease pain. Discussed that it can also decrease T cell response and thus be helpful in decreasing inflammation, and symptoms of brain fog, fatigue, anxiety, depression, and allergies. Discussed that this medication needs to be compounded at a compounding pharmacy and can more expensive. Discussed that I usually start at 1mg  and if this is not providing enough relief then I titrate upward on a monthly basis.   -stop lamotrigine as it is making her dizzy without benefit -reviewed patient's medical notes -discussed her progress with getting a referral to [facility] -recommended going to ED given darkening of skin, worsening eye closure, finding of cyst, dizziness to receive prompt neurosurgical eval regarding if surgery is necessary, and also for pain management given worsening pain refractory to all treatments and resulting suicidality. Reviewed ED doc notes- he discussed with on-call neurology and NSGY and  determined that patient could be discharged with outpatient follow-up. IV fentanyl was administered with relief to patient. She was discharged on fentanyl patch which has not provided relief. She is getting 49mcg- no side effects- discussed increasing to 44mcg and her husband is in agreement. Prior auth completed and approved. Discussed with husband starting patch on Satruday after she has worn 47mcg patch for 72 hours -encouraged follow-up with Dr. Joaquim Nam, husband let me know appointment has been set up 1/18.  -recommended follow-up with Dr. 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  -Discussed that turmeric  -discussed steroid taper. -continue savella, husband feels like it is helping with her suicidal thoughts. Increase to 25mg .  -continue hydrocodone.  -discuss mouth guard with dentist.  -d/c topamax -she does have another procedure scheduled. -discussed benefits of infrared light.  -Continue Norco 5mg  TID PRN -follow-up monthly with Zella Ball and with me in February, placed on waitlist for earlier appointment with me, advised phone visits/MyChart are a great way to communicate in the interim -discussed that she can take an additional one or two tylenol for breakthrough pain -recommended checking CMP for liver enzymes with next labs -prescribed NAC 600mg  BID to protect the liver while taking tylenol, discussed its health benefits for mitochondrial function -d/c Baclofen  -f/u with Dr. Joaquim Nam  and Dr. Davy Pique 1/18 -discussed retrying carbamazepine to see if it is effective for her. It is $89 for her, so we discussed trying Lamictal instead.  -encouraged anti-inflammatory diet.   -she had sinusitis that was viral and was treated with antibiotics. This was in the early 38s. The symptoms resolved after 2 months. She uses to get sinus infections and saline rinse.  -She has had multiple root canals.  -Discontinue 100% oxygen via facemask as this was not helpful.  -Discussed that unfortunately [employer] PNS is not indicated for craniofacial pain. -Amitriptyline and Lyrica did not help.  -Continue Cymbalta 60mg .  -Topiramate helped but caused dizziness.  -Provided with hand out with information regarding trigeminal neuralgia -Failed Penytoin.  -trial lidocaine patch -She would like to try biofeedback. -reviewed neurology note.  -referred to Dr. Juel Burrow.  -will try trigger point injections next visit.  -Continue Ketamine 10%, Baclofen 2%, Cyclobenzaprine 2%, Ketoprofen 10%, Gabapentin 6%, Bupivacaine 1%, Amitryptiline 5% to apply to painful areas- using 3-4 times per day.  -Can consider Meloxicam if Phenytoin does not help.  -Discussed Qutenza as an option for neuropathic pain control. Discussed that this is a capsaicin patch, stronger than capsaicin cream. Discussed that it is currently approved for diabetic peripheral neuropathy and post-herpetic neuralgia, but that it has also shown benefit in treating other forms of neuropathy. Provided patient with link to site to learn more about the patch: [employer]. Discussed that the patch would be placed in office and benefits usually last 3 months. Discussed that unintended exposure to capsaicin can cause severe irritation of eyes, mucous membranes, respiratory tract, and skin, but that Qutenza is a local treatment and does not have the systemic side effects of other nerve medications. Discussed that there may be pain, itching, erythema, and decreased sensory function associated with the application of Qutenza. Side effects usually subside within 1 week. A cold pack of analgesic medications can help with these side  effects. Blood pressure can also be increased due to pain associated with administration of the patch.  -she cut down on her gabapentin due to her dizziness    Turmeric to reduce inflammation--can be used in cooking or taken as a supplement.  Benefits of turmeric:  -Highly anti-inflammatory  -Increases antioxidants  -Improves memory, attention, brain disease  -Lowers risk of heart disease  -May help prevent cancer  -Decreases pain  -Alleviates depression  -Delays aging and decreases risk of chronic disease  -Consume with black pepper to increase absorption    Turmeric Milk Recipe:  1 cup milk  1 tsp turmeric  1 tsp cinnamon  1 tsp grated ginger (optional)  Black pepper (boosts the anti-inflammatory properties of turmeric).  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  -she is a good metabolizer of amitriptyline and has tolerated nortrypilline in the past, but without much benefit.    3) CKD: encouraged 6-8 glasses of water per day. She states that she had AKI with Ibuprofen in  the past. Discussed checking Cr level next visit if we are considering Meloxicam -Discussed that Nucynta, and other pain medications, may stay in her bloodstream longer given her CKD   4) Hypernatremia: encouraged hydration   5) General health and pain: provided with list of healthy foods that are both nutritious and  fight pain.   6) Depressed: Amitriptyline did help with this but she could not tolerate the drowsiness. Encouraged focusing on the factors that she can control, such as anti-inflammatory diet.   7) Fibromyalgia: Discussed goal to wean steroids prior to surgery. Continue 1mg  daily. She is on that intermittently. She started this again about a month ago. She has been on this about a year. Continue Savella -discussed benefits of cold water showers.  -continue gabapentin.  -high dose vitamin D sent  8) Dizziness: -discussed that this could be from the combination of gabapentin and baclofen.  -discussed that all the medications we are trying for pain may worsen the dizziness  9) Constipation:  -Provided list of following foods that help with constipation and highlighted a few: 1) prunes- contain high amounts of fiber.  2) apples- has a form of dietary fiber called pectin that accelerates stool movement and increases beneficial gut bacteria 3) pears- in addition to fiber, also high in fructose and sorbitol which have laxative effect 4) figs- contain an enzyme ficin which helps to speed colonic transit 5) kiwis- contain an enzyme actinidin that improves gut motility and reduces constipation 6) oranges- rich in pectin (like apples) 7) grapefruits- contain a flavanol naringenin which has a laxative effect 8) vegetables- rich in fiber and also great sources of folate, vitamin C, and K 9) artichoke- high in inulin, prebiotic great for the microbiome 10) chicory- increases stool frequency and softness (can be added to coffee) 11) rhubarb- laxative effect 12) sweet potato- high fiber 13) beans, peas, and lentils- contain both soluble and insoluble fiber 14) chia seeds- improves intestinal health and gut flora 15) flaxseeds- laxative effect 16) whole grain rye bread- high in fiber 17) oat bran- high in soluble and insoluble fiber 18) kefir- softens stools -recommended to try at least one of these  foods every day.  -drink 6-8 glasses of water per day -walk regularly, especially after meals.   10) Osteopenia - was tapered off the prednisone -discussed that currently her pain may be more of a risk to her than osteopenia given her suicidal ideation from her pain -discussed risk of fall with the medications that make her dizziness.   11) Daytime sleepiness -discussed that this is secondary to her medications.  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   15) HTN: -discussed clonidine patch could potentially help with HTN and pain.  -BP is 170/90 today.  -Advised checking BP daily at home and logging results to bring into follow-up appointment with PCP and myself. -Reviewed BP meds today.  -Advised regarding healthy foods that can help lower blood pressure and provided with a list: 1) citrus foods- high in vitamins and minerals 2) salmon and other fatty fish - reduces inflammation and oxylipins 3) swiss chard (leafy green)- high level of nitrates 4) pumpkin seeds- one of the best natural sources of magnesium 5) Beans and lentils- high in fiber, magnesium, and potassium 6) Berries- high in flavonoids 7) Amaranth (whole grain, can be cooked  similarly to rice and oats)- high in magnesium and fiber 8) Pistachios- even more effective at reducing BP than other nuts 9) Carrots- high in phenolic compounds that relax blood vessels and reduce inflammation 10) Celery- contain phthalides that relax tissues of arterial walls 11) Tomatoes- can also improve cholesterol and reduce risk of heart disease 12) Broccoli- good source of magnesium, calcium, and potassium 13) Greek yogurt: high in potassium and calcium 14) Herbs and spices: Celery seed, cilantro, saffron, lemongrass, black cumin,  ginseng, cinnamon, cardamom, sweet basil, and ginger 15) Chia and flax seeds- also help to lower cholesterol and blood sugar 16) Beets- high levels of nitrates that relax blood vessels  17) spinach and bananas- high in potassium  -Provided lise of supplements that can help with hypertension:  1) magnesium: one high quality brand is [employer] since it contains all 7 types of magnesium, otherwise over the counter magnesium gluconate 400mg  is a good option 2) B vitamins 3) vitamin D 4) potassium 5) CoQ10 6) L-arginine 7) Vitamin C 8) Beetroot -Educated that goal BP is 120/80. -Made goal to incorporate some of the above foods into diet.

## 2022-09-17 DIAGNOSIS — M25569 Pain in unspecified knee: Secondary | ICD-10-CM | POA: Diagnosis not present

## 2022-09-17 DIAGNOSIS — R519 Headache, unspecified: Secondary | ICD-10-CM | POA: Diagnosis not present

## 2022-09-21 DIAGNOSIS — I10 Essential (primary) hypertension: Secondary | ICD-10-CM | POA: Diagnosis not present

## 2022-09-22 DIAGNOSIS — R519 Headache, unspecified: Secondary | ICD-10-CM | POA: Diagnosis not present

## 2022-09-22 DIAGNOSIS — M25569 Pain in unspecified knee: Secondary | ICD-10-CM | POA: Diagnosis not present

## 2022-09-24 ENCOUNTER — Ambulatory Visit (INDEPENDENT_AMBULATORY_CARE_PROVIDER_SITE_OTHER): Payer: Medicare Other | Admitting: Pulmonary Disease

## 2022-09-24 ENCOUNTER — Encounter (HOSPITAL_BASED_OUTPATIENT_CLINIC_OR_DEPARTMENT_OTHER): Payer: Self-pay | Admitting: Pulmonary Disease

## 2022-09-24 VITALS — BP 110/60 | HR 73 | Ht 63.0 in | Wt 140.8 lb

## 2022-09-24 DIAGNOSIS — R29898 Other symptoms and signs involving the musculoskeletal system: Secondary | ICD-10-CM

## 2022-09-24 MED ORDER — MONTELUKAST SODIUM 10 MG PO TABS: 10.0000 mg | ORAL_TABLET | Freq: Every day | ORAL | 3 refills | Status: DC

## 2022-09-24 NOTE — Patient Instructions (Signed)
Dyspnea on exertion Deconditioning --REFER to [facility]. Limited ROM with left knee. Would be good candidate for water aerobics  Reduced DLCO --No further work-up. Patient will contact us if she wishes for echocardiogram in the future  Allergic rhinitis --START singulair 10 mg daily

## 2022-09-24 NOTE — Progress Notes (Signed)
Synopsis: Referred in 06/2018 for Reduced DLCO  Subjective:   PATIENT ID: [name] GENDER: female DOB: 1940-12-10, MRN: [id number]   HPI  Chief Complaint  Patient presents with   Follow-up    Breathing is doing better   [name] is a 82 year old female never smoker with fibromyalgia previously on 5 years low prednisone 2.5 mg, hx trigemina neuralgia s/p gamma knife who presents for follow-up.  Synopsis: Initially presented for consult in 06/2018 for gradually worsening dyspnea x 6 months that is aggravated with stairs and exercise. Previously played tennis in the spring and fall however since the pandemic no longer is active at baseline. PFTs in 07/2018 with moderately reduced gas exchange. Work-up included TTE with mild diastolic heart dysfunction and VQ scan negative for ventilation or perfusion defects. She was prescribed xoponex however did not tolerate due to tremors. She was lost to follow-up from 2020 until early 2023.  07/28/21 Since our last visit, she has not had any worsening shortness of breath. Occurs with walking up stairs. Has tried albuterol in the past that was ineffective. No longer play tennis or swims since COVID. Her husband is present and provides additional history. She has been in bed 18 hours a day on some days. She has been undergoing treatment for trigemina neuralgia that has been unsuccessful including chronic pain management. She has been referred to [facility].   08/26/21 Husband is present for this visit as well. She reports unchanged shortness of breath. She has not been doing steps since our last appointment. Her attention has recently been focused on management for her trigemina neuralgia and are planning for evaluation with [facility] soon.  09/24/22 Husband is present for visit. Since her cortex stimulator generator implant for her trigemina neuralgia at [facility]. She continues to have shortness of breath including when  walking upstairs. Denies wheezing or coughing. She graduated from pulmonary rehab in summer 2023. She is currently in physical therapy for her knee. Would be interested to participated in water aerobics once she finishes physical therapy..  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 Lived in [place] for 3 years, used coal heating  Allergies  Allergen Reactions   Dipyridamole Other (See Comments)    Migraines  Other reaction(s): migraine  Persantine  Other reaction(s): Headache  Migraines    Migraines  Other reaction(s): migraine Persantine Other reaction(s): migraine Other reaction(s): Headache Other reaction(s): migraine  Migraines  Migraines  Other reaction(s): migraine Persantine Other reaction(s): migraine Other reaction(s): Headache Other reaction(s): migraine    Migraines    Migraines  Other reaction(s): migraine Persantine Other reaction(s): migraine Other reaction(s): Headache Other reaction(s): migraine   Ciprofloxacin Other (See Comments)    Interstitial cystitis, worsening renal function   Diclofenac Other (See Comments)    Other Reaction(s): hives   Diclofenac Sodium Other (See Comments) and Hives    Other reaction(s): hives Other reaction(s): hives Other reaction(s): hives   Hydrochlorothiazide Nausea Only    Chills  Taking at this time lower dose  Chills Taking at this time lower dose  Chills Taking at this time lower dose    Chills Taking at this time lower dose   Other Other (See Comments)    Cat and Dog Dander  Other reaction(s): bladder pain  Other reaction(s): muscle cramps  Other reaction(s): Unknown  Cat and Dog Dander  Other reaction(s): bladder pain Other reaction(s): muscle cramps Other reaction(s):  Unknown  Other Reaction(s): Other (See Comments)  Cat and dog dander/reaction is runny nose sneezing   Oxycodone Hcl Other (See Comments)    Other  reaction(s): bladder pain   Pantoprazole Other (See Comments)    Other reaction(s): increased tremors Other reaction(s): increased tremors Other reaction(s): increased tremors  Other Reaction(s): Other (See Comments)  Reaction: Tremor (intolerance)   Pantoprazole Sodium Other (See Comments)    Other reaction(s): increased tremors  Other Reaction(s): increased tremors   Triamterene Other (See Comments)    Made Tremors worse  Other reaction(s): Other (See Comments)  Made Tremors worse Other reaction(s): Other (See Comments)   Triamterene-Hctz Other (See Comments)    Other reaction(s): muscle cramps   Buprenorphine Other (See Comments)    "knocks me out"  Other reaction(s): Unknown  Butrans  Other Reaction(s): Drowsiness, Not available, Unknown  "knocks me out"    "knocks me out" Other reaction(s): Unknown Butrans Other reaction(s): Unknown Other reaction(s): Unknown   Codeine Nausea Only and Other (See Comments)    Made her feel loopy  Other reaction(s): nausea  Other Reaction(s): GI Intolerance, Other (See Comments), Unknown  Made her feel loopy Other reaction(s): nausea Other reaction(s): nausea  Made her feel loopy Other reaction(s): nausea Other reaction(s): nausea    Made her feel loopy Other reaction(s): nausea Other reaction(s): nausea   Diclofenac Sodium Hives, Other (See Comments) and Rash    Other reaction(s): hives Other reaction(s): hives Other reaction(s): hives  Other reaction(s): hives Other reaction(s): hives Other reaction(s): hives    Other reaction(s): hives Other reaction(s): hives Other reaction(s): hives   Doxycycline Other (See Comments) and Rash    Other reaction(s): rash  Other reaction(s): rash Other reaction(s): rash Other reaction(s): rash  Other reaction(s): rash Other reaction(s): rash Other reaction(s): rash    Other reaction(s): rash Other reaction(s): rash Other reaction(s): rash   Esomeprazole Other (See Comments)    Jittery,  sore throat  Other reaction(s): jittery, sore throat  Other reaction(s): Other  Other Reaction(s): jittery, sore throat  Jittery, sore throat Jittery, sore throat    Jittery, sore throat Other reaction(s): jittery, sore throat Other reaction(s): jittery, sore throat Other reaction(s): Other Other reaction(s): jittery, sore throat  Jittery, sore throat Jittery, sore throat  Jittery, sore throat Other reaction(s): jittery, sore throat Other reaction(s): jittery, sore throat Other reaction(s): Other Other reaction(s): jittery, sore throat    Jittery, sore throat Jittery, sore throat    Jittery, sore throat Other reaction(s): jittery, sore throat Other reaction(s): jittery, sore throat Other reaction(s): Other Other reaction(s): jittery, sore throat  Other Reaction(s): Other (See Comments)  Jittery, sore throat, Jittery, sore throat   Hydrochlorothiazide W-Triamterene Hives, Other (See Comments) and Rash    Muscle Cramps  Other reaction(s): muscle cramps  Other reaction(s): Other  Other Reaction(s): muscle cramps  Cramps Cramps    Muscle Cramps  Other reaction(s): muscle cramps Other reaction(s): Other  Other Reaction(s): Other (See Comments)  Cramps, Cramps   Irbesartan Diarrhea, Other (See Comments) and Rash    Other reaction(s): loose stools  Other Reaction(s): loose stools  Other reaction(s): loose stools Other reaction(s): loose stools Other reaction(s): loose stools Other reaction(s): loose stools  Other reaction(s): loose stools Other reaction(s): loose stools Other reaction(s): loose stools Other reaction(s): loose stools    Other reaction(s): loose stools Other reaction(s): loose stools Other reaction(s): loose stools Other reaction(s): loose stools   Lactose Other (See Comments)    GI upset   Lactose Intolerance (Gi) Other (  See Comments)    GI upset    Lactulose Other (See Comments), Hives and Rash    GI upset   Losartan Hives, Other (See Comments) and Rash     Hot and chills  Other reaction(s): hot and chills  Other reaction(s): flushing  Other Reaction(s): Fever, hot and chills  Hot and chills Hot and chills    Hot and chills Other reaction(s): hot and chills Other reaction(s): hot and chills Other reaction(s): hot and chills Other reaction(s): flushing Other reaction(s): hot and chills  Other Reaction(s): Other (See Comments)  Hot and chills, Hot and chills   Naproxen Other (See Comments), Hives and Rash    Other reaction(s): worsens interstitial cystitis  Other Reaction(s): worsens interstitial cystitis  Worsens IC symptoms    Other reaction(s): worsens interstitial cystitis Other reaction(s): worsens interstitial cystitis Other reaction(s): worsens interstitial cystitis   Nortriptyline Hives and Other (See Comments)    "My body doesn't metabolize it anymore." Other reaction(s): Unknown Other reaction(s): Unknown Other reaction(s): Unknown Other reaction(s): Unknown   Nortriptyline Hcl Other (See Comments)    "My body doesn't metabolize it anymore."  Other reaction(s): Unknown   Oxycodone Other (See Comments)    Interstitial cystitis  Bladder pain  Other reaction(s): bladder pain  Other reaction(s): other  Other Reaction(s): bladder pain  Interstitial cystitis    Interstitial cystitis Bladder pain Other reaction(s): bladder pain Other reaction(s): other    Other reaction(s): bladder pain  Other Reaction(s): Other (See Comments)   Oxycontin [Oxycodone Hcl] Other (See Comments)    Interstitial cystitis    Pennsaid [Diclofenac Sodium] Hives   Telmisartan Hives, Other (See Comments) and Rash    headache  Other reaction(s): sinus pain, ha's  Other reaction(s): headache  Other Reaction(s): Headache, sinus pain, ha's  headache headache    headache Other reaction(s): sinus pain, ha's Other reaction(s): sinus pain, ha's Other reaction(s): sinus pain, ha's Other reaction(s): headache Other reaction(s): sinus pain,  ha's  Other Reaction(s): Other (See Comments)  headache, headache   Tramadol Itching and Other (See Comments)    sedation  Other reaction(s): sedation/itching  Other Reaction(s): sedation/itching  sedation sedation    sedation Other reaction(s): sedation/itching Other reaction(s): sedation/itching Other reaction(s): sedation/itching Other reaction(s): sedation/itching  sedation, sedation     Outpatient Medications Prior to Visit  Medication Sig Dispense Refill   acetaminophen (TYLENOL) 500 MG tablet Take by mouth.     Acetylcysteine (NAC) 600 MG CAPS Take 1 capsule (600 mg total) by mouth 2 (two) times daily. 60 capsule 0   ACIDOPHILUS LACTOBACILLUS PO Take 1 tablet by mouth daily.     amitriptyline (ELAVIL) 10 MG tablet Take 1 tablet by mouth at bedtime.     amLODipine (NORVASC) 2.5 MG tablet Take 2.5 mg by mouth daily.     amLODipine (NORVASC) 5 MG tablet Take 1 tablet by mouth once daily Orally Once a day for 90 days     benzocaine (ORAJEL) 10 % mucosal gel Use as directed 1 application in the mouth or throat as needed for mouth pain. 5.3 g 0   calcium carbonate (SUPER CALCIUM) 1500 (600 Ca) MG TABS tablet 1 tablet with meals Orally Once a day     cetirizine (ZYRTEC ALLERGY) 10 MG tablet 1 tablet Orally Once a day     cetirizine (ZYRTEC) 10 MG chewable tablet Chew by mouth.     chlorhexidine (PERIDEX) 0.12 % solution SMARTSIG:By Mouth     Cholecalciferol (VITAMIN D-3) 25 MCG (1000 UT)  CAPS Take 2,000 Units by mouth daily.     Cholecalciferol 125 MCG (5000 UT) capsule Take by mouth.     Cholecalciferol 50 MCG (2000 UT) CAPS Take by mouth.     cyanocobalamin (VITAMIN B12) 1000 MCG tablet Take by mouth.     denosumab (PROLIA) 60 MG/ML SOSY injection as directed Every 6 months     DULoxetine (CYMBALTA) 20 MG capsule Take by mouth.     DULoxetine (CYMBALTA) 30 MG capsule 3 capsules Orally Once a day     estradiol (ESTRACE) 0.1 MG/GM vaginal cream Place vaginally.     fluticasone  (FLONASE ALLERGY RELIEF) 50 MCG/ACT nasal spray 1 spray in each nostril Nasally Once a day     gabapentin (NEURONTIN) 100 MG capsule 2 capsules Orally four times a day     gabapentin (NEURONTIN) 300 MG capsule Take by mouth.     hydrochlorothiazide (MICROZIDE) 12.5 MG capsule Take by mouth.     Methylcobalamin 1000 MCG SUBL 1 tablet Takes 2500 mg.     Multiple Vitamins-Minerals (PRESERVISION AREDS) CAPS 2 capsule Orally twice a day     Multiple Vitamins-Minerals (VITAMIN D3 COMPLETE) TABS 2000 IU Orally daily     naloxone (NARCAN) 0.4 MG/ML injection Use prn for opioid overdose 1 mL 3   naltrexone (DEPADE) 50 MG tablet Take by mouth.     NONFORMULARY OR COMPOUNDED ITEM Apply 1 Pump topically 4 (four) times daily as needed. Ketamine 10%, Baclofen 2%, Cyclobenzaprine 2%, Ketoprofen 10%, Gabapentin 6%, Bupivacaine 1%, Amitryptiline 5% Dispense 100mg  100 each 3   ondansetron (ZOFRAN) 4 MG tablet 1 tablet as needed Orally Twice a day for 30 days     ondansetron (ZOFRAN-ODT) 4 MG disintegrating tablet Take by mouth.     pantoprazole (PROTONIX) 40 MG tablet Take 40 mg by mouth daily.     progesterone (PROMETRIUM) 100 MG capsule      Propylene Glycol (SYSTANE COMPLETE PF OP) Apply to eye.     Pyridoxine HCl (VITAMIN B-6 PO) Vitamin B6     RA GLUCOSAMINE SULFATE 500 MG TABS      rosuvastatin (CRESTOR) 5 MG tablet Take 5 mg by mouth daily.     Vitamin D, Ergocalciferol, (DRISDOL) 1.25 MG (50000 UNIT) CAPS capsule Take 1 capsule (50,000 Units total) by mouth every 7 (seven) days. 20 capsule 0   irbesartan (AVAPRO) 300 MG tablet  (Patient not taking: Reported on 09/24/2022)     levothyroxine (SYNTHROID) 50 MCG tablet Take by mouth. (Patient not taking: Reported on 09/24/2022)     levothyroxine (SYNTHROID) 75 MCG tablet Take 75 mcg by mouth daily before breakfast. (Patient not taking: Reported on 09/24/2022)     losartan (COZAAR) 100 MG tablet  (Patient not taking: Reported on 09/24/2022)     olmesartan (BENICAR)  40 MG tablet Take by mouth. (Patient not taking: Reported on 09/24/2022)     topiramate (TOPAMAX) 25 MG tablet Take by mouth. (Patient not taking: Reported on 09/24/2022)     No facility-administered medications prior to visit.    Review of Systems  Constitutional:  Negative for chills, diaphoresis, fever, malaise/fatigue and weight loss.  HENT:  Negative for congestion.   Respiratory:  Positive for shortness of breath. Negative for cough, hemoptysis, sputum production and wheezing.   Cardiovascular:  Negative for chest pain, palpitations and leg swelling.  Neurological:  Positive for weakness. Negative for focal weakness.     Objective:   Vitals:   09/24/22 1420  BP: 110/60  Pulse: 73  SpO2: 95%  Weight: 140 lb 12.8 oz (63.9 kg)  Height:  (1.6 m)   SpO2: 95 % O2 Device: None (Room air)  Physical Exam: General: Elderly and frail-appearing, no acute distress HENT: [place], AT Eyes: EOMI, no scleral icterus Respiratory: Clear to auscultation bilaterally.  No crackles, wheezing or rales Cardiovascular: RRR, -M/R/G, no JVD Extremities:-Edema,-tenderness Neuro: AAO x4, CNII-XII grossly intact Psych: Normal mood, normal affect   Data Imaging CXR 07/15/18 - no infiltrate, edema or effusion. VQ scan 07/15/18 - no V/Q mismatch HRCT Chest 08/20/21 - Biapical pleuroparenchymal scarring. No evidence of ILD. No significant GGO, septal thickening, subpleural reticulation, traction bronchiectasis or honeycombing present. Calcified mediastinal and hilar lymph nodes.  Calcified splenic artery aneurysm. CT Coronary 03/24/22 - Visualized lung parenchyma with unchanged mediastinal and hilar nodes, calcified. No masses, infiltrate, effusion or pneumothorax.   PFT 07/04/18 FVC 2.77 (107%) FEV1 2.27 (118%) Ratio 82  TLC 96% DLCO 61% Interpretation: Normal spirometry. Mildly reduced DLCO  07/28/21 FVC 2.6 (106%) FEV1 2.23 (123%) Ratio 84 TLC 90% DLCO 65% Interpretation: No obstructive and  restrictive defect. Mildly reduced DLCO  TTE 07/05/18 Normal EF 55-60%. Mild diastolic dysfunction, mild TR, mild PH  Labs CBC 07/01/18 reviewed. Hg appropriate. 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 Unclear cause of her reduced DLCO. CT Chest reviewed with no evidence of early ILD and recent labs neg for anemia. Low suspicion for pulmonary vascular disease however can re-evaluate with echocardiogram. We discussed utility in further testing and after discussion she wishes to hold and working on strength at this point. Counseled on regular activity. No benefit from inhalers and unable to tolerate beta-agonists due to tremors.  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 Lost to follow-up for one year. Previously did well after pulmonary rehab however decreased activity with her joint/knee issues which has lead to worsening dyspnea on exertion again.  Dyspnea on exertion Deconditioning  --REFER to [facility]. Limited ROM with left knee. Would be good candidate for water aerobics  Reduced DLCO --No further work-up. Patient will contact us if she wishes for echocardiogram in the future  Allergic rhinitis - uncontrolled --START singulair 10 mg daily  Orders Placed This  Encounter  Procedures   Ambulatory referral to Physical Therapy    Referral Priority:   Routine    Referral Type:   Physical Medicine    Referral Reason:   Specialty Services Required    Requested Specialty:   Physical Therapy    Number of Visits Requested:   1   Meds ordered this encounter  Medications   montelukast (SINGULAIR) 10 MG tablet    Sig: Take 1 tablet (10 mg total) by mouth at bedtime.  Dispense:  90 tablet    Refill:  3   Return in about 1 year (around 09/24/2023) for routine follow-up.   I have spent a total time of 33-minutes on the day of the appointment reviewing prior documentation, coordinating care and discussing medical diagnosis and plan with the patient/family. Past medical history, allergies, medications were reviewed. Pertinent imaging, labs and tests included in this note have been reviewed and interpreted independently by me.  Delia Sitar Mechele Collin, MD [facility] 09/24/2022 2:51 PM

## 2022-09-29 ENCOUNTER — Encounter (HOSPITAL_BASED_OUTPATIENT_CLINIC_OR_DEPARTMENT_OTHER): Payer: Self-pay | Admitting: Pulmonary Disease

## 2022-09-30 DIAGNOSIS — Z9682 Presence of neurostimulator: Secondary | ICD-10-CM | POA: Diagnosis not present

## 2022-09-30 DIAGNOSIS — G509 Disorder of trigeminal nerve, unspecified: Secondary | ICD-10-CM | POA: Diagnosis not present

## 2022-10-06 DIAGNOSIS — M25569 Pain in unspecified knee: Secondary | ICD-10-CM | POA: Diagnosis not present

## 2022-10-06 DIAGNOSIS — R519 Headache, unspecified: Secondary | ICD-10-CM | POA: Diagnosis not present

## 2022-10-13 DIAGNOSIS — M25569 Pain in unspecified knee: Secondary | ICD-10-CM | POA: Diagnosis not present

## 2022-10-13 DIAGNOSIS — R519 Headache, unspecified: Secondary | ICD-10-CM | POA: Diagnosis not present

## 2022-10-19 DIAGNOSIS — I10 Essential (primary) hypertension: Secondary | ICD-10-CM | POA: Diagnosis not present

## 2022-10-27 ENCOUNTER — Ambulatory Visit (HOSPITAL_BASED_OUTPATIENT_CLINIC_OR_DEPARTMENT_OTHER): Payer: Medicare Other | Admitting: Physical Therapy

## 2022-10-27 ENCOUNTER — Encounter
Payer: Medicare Other | Attending: Physical Medicine and Rehabilitation | Admitting: Physical Medicine and Rehabilitation

## 2022-10-27 ENCOUNTER — Encounter: Payer: Self-pay | Admitting: Physical Medicine and Rehabilitation

## 2022-10-27 VITALS — BP 127/71 | HR 70 | Ht 63.0 in | Wt 143.2 lb

## 2022-10-27 DIAGNOSIS — M25569 Pain in unspecified knee: Secondary | ICD-10-CM | POA: Diagnosis not present

## 2022-10-27 DIAGNOSIS — G500 Trigeminal neuralgia: Secondary | ICD-10-CM | POA: Diagnosis not present

## 2022-10-27 DIAGNOSIS — R519 Headache, unspecified: Secondary | ICD-10-CM | POA: Diagnosis not present

## 2022-10-27 NOTE — Progress Notes (Signed)
Subjective:    Patient ID: [name], female    DOB: April 18, 1941, 82 y.o.   MRN: [id number]  HPI  Mrs. [name] presents today for f/u of severe trigeminal neuralgia.   1) Severe trigeminal neuralgia: -today pain is 7/10 -she changed programs yesterday -she went to see the doctor in [place] and was only been able to spend 5 minutes with her -they did about 15 minutes of adjustments and the new programs seemed to hurt more than they help -has had stimulator turned off since last night -was able to enjoy her birthday -she did well for 3 weeks but recently pain has been around 8-10 -when pain is very severe it makes her eye hurt -the way her stimulator is set it makes her nose hurt She is going back to [name] on April 10th -she is managing with the gabapentin and had to take 2 extra yesterday and it did not make her sleepier  -pain fluctuates  -she went to [name] with her children.  -she keeps a journal of when she changes the program and what her pain level is in her teeth, eyes, and cheek. She put in not to use the teeth program -she said if they could hit the nerve between the two teeth that would be helpful -they have her an 11 and 12 in that region,  -she cannot get the right level of stimulation. Before she left she decreased it by 50%. Now she is on a cheat program and the lowest amount. It is helping the teeth but hurting the eye.  -[name] had a baby on 11/14 and she is not coming back until January.  -she talked to the representative -last night she turned off the stimulator -she slept almost day today and did not want to wake up for supper which was rare for her -she is having a difficult time weaning off the hydrocodone.  -she has been off of it in two days -she is expecting a return call from [name] soon to discuss wean off fentanyl -drove up on Tuesday and on Wednesday was supposed to have an appointment with pain psychologist. When they got there  they were told that appointment had been changed to virtual. The next day they had a more sophisticated MRI. [name] did moving in MRI so MRI was not as helpful. The next day they saw [name] and discussed several procedural options and then went back to the motor stimulator- that is scheduled on October 16th.  -she recently went on vacation and had to spend the majority of the vacation in bed due to the severity of her pain -she stopped using the intranasal ketamine because although it helped initially, it appeared to make her pain worse later -she asks if she can wean off some medication as she had a recent appointment with [name] and was told she may have a lot of postoperative pain if she is taking so much pain medication. She would still like to do this and also experiences sedation from her medication. She asks how she would go about weaning her medications -she asks if she should wean off the gabapentin since this makes her so sleepy She asks if she should restart Topamax. -she was encouraged by her phone visit with [name] and plans to pursue [facility]. She is going to [name] next [facility] for Brain MRI, neuropsych eval, and to discuss the risks and benefits of the procedure with [name] -pain was  so severe in the past that she expressed suicidality to her husband [name].  -she feels that none of her medications are helpful but she knows that when she tries to wean them her pain worsens.  -she asks about refill of the compounding cream -her husband asks about referral to ketamine clinic for depression rather than pain -she went back to the dentist because it was urting between two teeth and he said there is nothing wrong with those teeth and it is the trigeminal nerve When she took a shower that usually seems to help with but was migrating around her face.  -appointment with neurology in July -[name] cancelled the zoom call in April but this was rescheduled to July.  -she finds that only  the gabapentin helps -she is not sure if the fentanyl helps When she is feeling really bad she tries to take another one but she gets very sleepy and dizzy.  -she is taking 8 a day of 100mg  per day- she takes two gabapentin and tylenol and hydrocodone in the morning. She takes another two at breakfast around 11, another 2 at lunch around 1:30pm.  -she had a recent root canal and pain has become much more severe after that -her pain is in the two teeth next to where she had the root canal -Oragel heps for a bout an hour.  -she tried increasing her Gabapentin to 300mg  TID but she could not tolerate this. It made her too sleepy.  -she contacted [name] and he recommended she pause wean of Fentanyl patch and I agree with this -she took last patch last night. She still has 12.41mcg patch available -her husband asks whether she should take a higher dose of gabapentin -the fentanyl patch makes her very sleepy and so allows her to sleep. It has also made her dizzier.  -the fentanyl patch did not provide more relief- she would like to wean off. Weaned to 25, then 12.3mcg, then off this [facility] -referral was to [name] and not to [name] she states and they need a new referral send to [name] -she is using the compound cream and this helps- she uses this twice per day -capsaicin made it burn  -lamotrigine makes her too sleepy and dizzy to come down the stairs by herself and she does not feel comfortable going to the shower -left eye more open than usual. -she does not feel she is getting relief from the fentanyl patch -she feels that the procedure at [name] is a last resort.  -Given darkening of skin and worsening eye lid closure, MRI brain stat obtained and shows new cyst that could be post-surgical. Recommended she go to [facility] given worsening dizziness and facial swelling as well. [name] discussed [facility] with NSGY and neurology and discharged home recommending  follow-up with Dr. Belva Bertin and Dr. Molli Hazard. She was given IV Fentanyl in the [facility] which helped and discharged on Fentanyl patch, which has not helped.  -discussed increasing Fentanyl patch to since she has not experienced any negative side effects, no respiratory depression, and her husband is agreeable. I have sent in new script to start Saturday, but insurance required prior auth, which was approved.  -tonight would be the third tay of the Fentanyl patch -she is still taking the Gabapentin -she feels that when she lays down she is spinning.  -eye continues to appear more closed.  -Husband has called Dr. Kathlene Cote and Dr. Barb Merino office to  scheduled follow-up appointments and has appointments with both tomorrow.  -Will run out of Fentanyl patch by Sunday -pain is worsening and nothing is providing relief -she asks whether she should go to [facility] -she has also noted dizziness and is not sure if this is from being in the MRI or from the [place].  -morphine was not effective and caused itching, sarna lotion did help wit this.  -hydrocodone has been most effective thus far -nucynta was not effective but she is not sure whether she gave it a fair trial -she has month appointments scheduled with Riley Lam and f/u with me in Feb -she used the Norco last night and this morning with good benefit and has started to feel the pain return this afternoon -she did find benefit from the compound cream- she continues to use this.  -She continues to have a sharp shooting pain doing around her eyelid and the tip teeth and in the top of her head.  -has a radiofrequency ablation treatment that helped but not completely -she is not having as much sharp knife-like pin like in her eye Her top teeth in her left law have been hurting really badly.  -she now takes the baclofen with the gabapentin and they seem to work well together.  -she had surgical procedure and feels worse -yesterday she took a half  a tablet but this morning she took a whole one. She does fine with the hydrocodone but a whole one makes her too sleepy.  -she is taking 2 100mg  Gabapentin three times per day but it makes her dizzy and sleepy so she cannot funciton -the cold worsens her pain.  -The IV fentanyl did help with the pain.  -her husband feels she should get one more dose of the 25mg  fentanyl. -she did get one Fuller benefit from the steroid injection.  -she is doing terribly -she is miserable -she feels depressed due to the pain -she has considered taking the whole bottle of hydrocodone because because she can't take this pain anymore.  -she started taking the Gabapentin 300mg  TID.  -she does find relief from the hydrocodone -the pain is constant -she wonders if it is inflammation.  -she has not tried turmeric. Her son-in-law gave her a bottle and she couldn't stomach it.   2) Fibromyalgia: -She has been almost nonfunctional due to the severity of her pain. It continues to be very severe.  -She would like to wean off the Gabapentin as she feels at risk of falls -she is taking Topamax BID. She felt this was helpful but made her unsteady on her feet.  -still using gabapentin and this is helping.  -she feels so fatigued that she can only have a shower every other day.   3) Osteoporosis: -husband asks whether she should get a Prolia shot.   4) Dizziness: -head was swimming -she felt like she was going to fall.  -worsening -feels like she is spinning, not the room  -She tried the Phenytoin for 2 days and did not feel benefit so stopped it.   -she feels the gabapentin aggravates this and she would love to wean off but knows it helps her.   -She feels very sleepy during the day.   -She has never tried Topirmate before.   5) Sore throat -developed stopping lamotrigine since not helping.   Prior history:  Her pain continues to be in her left eye, forehead. She has not tried any topicals for pain. She uses  capsaicin for her back  and knees.   Since last visit nothing we have tried has helped with her pain. She has tried to decrease her Gabapentin dose but then her pain increased so she has resumed taking 4 Gabapentin per day.   Her husband asks whether he should contact her surgeon regarding whether nerve may have become unclamped.   Discussed prolotherapy, trigger point injections, Baclofen, Clonazepam, Phenytoin as other options we could try,=.  Pain continues to be excruciating and she states she is not sure how much longer she can live like this.   Prior history: She has having some nausea, lightheadedness and felt this may be related to her Gabapentin. She did not take any yesterday and then felt more sever pain in the evening. She also gets electrical shooting across her jaw.   She does not feel that she has cluster headaches as the pain has been so constant. She feels that she is getting worse. The oxygen made her feel relaxed but it is not helping.   The Sarna lotion does help with the itching in her head.   She was willing to try the Amitriptyline but it makes her too sleepy in the morning.   Her teeth and left eye incredibly hurt.   She feels that the Gabapentin has been making her dizziness.   She asks about biofeedback.   The pain is 7/10 on average, similar to last time.   When the pain is severe it can make her depressed.   Pain Inventory Average pain 8 Pain Right Now7 My pain is constant, sharp, burning, dull, stabbing, tingling, and aching  In the last 24 hours, has pain interfered with the following? General activity 10 Relation with others 8 Enjoyment of life 10  What TIME of day is your pain at its worst? morning , daytime, evening, night, and varies Sleep (in general) Good    Family History  Problem Relation Age of Onset   Heart disease Mother    Heart disease Father    Stroke Father    Stroke Sister    Asthma Daughter    Colon cancer Neg Hx     Social History   Socioeconomic History   Marital status: Married    Spouse name: Not on file   Number of children: 2   Years of education: college   Highest education level: Not on file  Occupational History   Occupation: housewife    Employer: RETIRED  Tobacco Use   Smoking status: Never   Smokeless tobacco: Never  Vaping Use   Vaping Use: Never used  Substance and Sexual Activity   Alcohol use: No    Alcohol/Fuller: 0.0 standard drinks of alcohol   Drug use: No   Sexual activity: Not on file  Other Topics Concern   Not on file  Social History Narrative   1 cup of Jasmine tea daily    Social Determinants of Health   Financial Resource Strain: Not on file  Food Insecurity: Not on file  Transportation Needs: Not on file  Physical Activity: Not on file  Stress: Not on file  Social Connections: Not on file   Past Surgical History:  Procedure Laterality Date   CHOLECYSTECTOMY  03/2010   COLONOSCOPY  2009   Dr. Harvie Junior    CYSTOSCOPY     Multiple Cystoscopies with stents or biopsy    ESOPHAGOGASTRODUODENOSCOPY  10/2013   [facility]     bilateral cataract removal   Gamma knife radiation  01/2018   for  trigeminal neuralgia   REFRACTIVE Fuller Bilateral 2022   RHIZOTOMY Left 04/28/2021   Procedure: Left Percutaneous trigeminal balloon rhizotomy;  Surgeon: Jadene Pierini, MD;  Location: [facility] OR;  Service: Neurosurgery;  Laterality: Left;   TONSILLECTOMY     Past Surgical History:  Procedure Laterality Date   CHOLECYSTECTOMY  03/2010   COLONOSCOPY  2009   Dr. Harvie Junior    CYSTOSCOPY     Multiple Cystoscopies with stents or biopsy    ESOPHAGOGASTRODUODENOSCOPY  10/2013   E[facility]     bilateral cataract removal   Gamma knife radiation  01/2018   for trigeminal neuralgia   REFRACTIVE Fuller Bilateral 2022   RHIZOTOMY Left 04/28/2021   Procedure: Left Percutaneous trigeminal balloon rhizotomy;  Surgeon: Jadene Pierini, MD;  Location: [facility] OR;   Service: Neurosurgery;  Laterality: Left;   TONSILLECTOMY     Past Medical History:  Diagnosis Date   Adenomatous colon polyp 03/24/2018   Allergy    Arthritis    Cataract    Chronic headaches    Depression    Fibromyalgia    Gallstones    GERD (gastroesophageal reflux disease)    Hyperlipemia    Hypertension    Interstitial cystitis    Lactose intolerance    LBBB (left bundle branch block)    Osteoarthritis    Osteopenia    Pneumonia    Thyroid disease    Tinnitus    Trigeminal neuralgia    BP (!) 147/75   Pulse 70   Ht 5\' 3"  (1.6 m)   Wt 143 lb 3.2 oz (65 kg)   SpO2 95%   BMI 25.37 kg/m   Opioid Risk Score:   Fall Risk Score:  `1  Depression screen PHQ 2/9     10/27/2022    2:05 PM 09/15/2022    1:25 PM 07/21/2022    1:38 PM 05/19/2022    1:11 PM 02/03/2022    1:29 PM 12/25/2021    2:01 PM 12/24/2021    1:11 PM  Depression screen PHQ 2/9  Decreased Interest 0 0 1 0 0 0 1  Down, Depressed, Hopeless 0 0 1 0 0 3 1  PHQ - 2 Score 0 0 2 0 0 3 2  Altered sleeping      0   Tired, decreased energy      0   Change in appetite      0   Feeling bad or failure about yourself       0   Trouble concentrating      0   Moving slowly or fidgety/restless      0   Suicidal thoughts      0   PHQ-9 Score      3   Difficult doing work/chores      Somewhat difficult     Review of Systems  Constitutional: Negative.   HENT: Negative.    Eyes:  Positive for visual disturbance.  Respiratory: Negative.    Cardiovascular: Negative.   Gastrointestinal: Negative.   Endocrine: Negative.   Genitourinary: Negative.   Musculoskeletal: Negative.        Pain in left side of face  Skin: Negative.   Allergic/Immunologic: Negative.   Neurological:  Positive for dizziness, tremors, light-headedness and numbness.       Tingling   Hematological: Negative.   Psychiatric/Behavioral: Negative.    All other systems reviewed and are negative.      Objective:   Physical Exam Gen:  no  distress, normal appearing, BP 127/71. Weight 143lbs, BMI 25.37 HEENT: oral mucosa pink and moist, NCAT Cardio: Reg rate Chest: normal effort, normal rate of breathing Abd: soft, non-distended Ext: no edema Psych: pleasant, normal affect Skin: intact Neuro: Alert and oriented x3    Assessment & Plan:  Mrs. Doto is an 82 year old woman who presents for f/u of left sided trigeminal neuralgia and dizziness.    1) Sensory deafferation pain, left sided trigeminal neuralgia vs cluster headaches (she does have lacrimation and runny nose on that side). -discussed the negative experience when she last went to [place] -discussed that next available appointment was on June 24th or 26th, but they were able to move this up to May 22nd or 24th -discussed that she keeps a journal with the setting with the level of her pain.  -Continue Diclofenac 3%, Gabapentin 5%, Lidocaine 5%, Menthol 1% compounded at [facility]:  apply 1-2 pumps three to four times per day as needed  -discussed response to her stimulator -discussed doubling the gabapentin when pain is severe Increase low dose naltrexone to 3mg  HS -discussed her response to the stimulator She does have a history of migraines.  -Discussed that if she has to be in bed all day due to her pain it may be beneficial for her husband to give her at least one hydrocodone per day to enable her to function -provided refill of Fentanyl patch as she will be leaving for her Fuller in [place] soon and her husband plans to stay there for some time in [place] she has any surgical complications.  -reviewed [name] note with her, discussed the plan for her visit to [facility] in May -ordered MRI Brain with and without contrast, discussed the plan for repeat MRI brain in May -discussed her follow-up with pain psychology.  -recommended weaning off Fentanyl, discussed stopping fentanyl because she is on 12.5mg  today but she had tried this a couple of weeks ago and  pain was so severe that she needed patch to be replaced.  -discussed that she is taking hydrocodone 4 tablets per day, and recommend to decrease this to 3 tabs per day for three days and then to keep decreasing the tab by once per day.  -discussed that skipping the morning dose of hydrocodone will at least allow her to sleep -recommended trying Frankincense and Myrrh essential oils mixed in coconut oil and applied to area of pain -recommended restarting Topamax -called [facility] to prescribe compounding gel refill -continue Gabapentin 100mg  up to 8 times a day, discussed her current schedule of takin -continue Fentanyl patch to and she is agreeable. Discussed that phenytoin, carbamazepine -discussed using Oragel multiple times per ay since this helps for an hour, use the topical cream intermittently in between -continue follow-up with [facility] to set up an appointment for her with them. Unfortunately [name] has retired but was able to set her up with Dr. Myrla Halsted who specializes in migraines and trigeminal neuralgia on July 25th. Let patient and husband know. They will mail her their address.  -restart 12. fentanyl patch as patient does not feel she can get through weekend with this level of pain -commended her for calling [name] as well to let him know how she is feeling -recommended taking 1 hydrocodone now for the severe pain, and discussed that the Fentanyl patch with take 12-24 hours to kick in -discussed with April the neurology referral and she did specify [name]  and spoke with [facility] Neurology department -discussed CBD oil, her son bought her some of this and she felt that it greatly helped  Recommend topical CBD oil- discussed its benefits in reducing inflammation, pain, insomnia, and anxiety, but she stopped because effect wore of.  -Discussed that CBD oil differs from marijuana in that it does not contain THC- the substance that causes  euphoria.  -Discussed that it is made from the hemp plant.  -It has been used for thousands of years -preliminary research suggests that if may be able to shrink cancerous tumors, stop plaque formation in Alzheimer's Disease, and slow the progress of brain disease from concussions.  -Additional benefits that have been demonstrated in studies include improved nausea, indigestion, and brain health, and reduced seizures.  -In a survey 92% of patients who tried medical cannabis felt it improved symptoms such as chronic pain, arthritis, migraines, and cancer.   -Provided with a pain relief journal and discussed that it contains foods and lifestyle tips to naturally help to improve pain. Discussed that these lifestyle strategies are also very good for health unlike some medications which can have negative side effects. Discussed that the act of keeping a journal can be therapeutic and helpful to realize patterns what helps to trigger and alleviate pain.   -discussed with Dr. Marcia Brash trigeminal deafferation pain, discussed the difference between this and trigeminal neuralgia with patient and husband, discussed motor cortex stimulation. -discussed whether or not to continue fentanyl patch, husband does feel that the IV fentanyl  -encouraged following with Dr. Sweet/Dr. Hyacinth Meeker at [name] to try procedure recommended by D. Ostegard.  -discussed mechanism of action of low dose naltrexone as an opioid receptor antagonist which stimulates your body's production of its own natural endogenous opioids, helping to decrease pain. Discussed that it can also decrease T cell response and thus be helpful in decreasing inflammation, and symptoms of brain fog, fatigue, anxiety, depression, and allergies. Discussed that this medication needs to be compounded at a compounding pharmacy and can more expensive. Discussed that I usually start at 1mg  and if this is not providing enough relief then I titrate upward on a monthly  basis.   -stop lamotrigine as it is making her dizzy without benefit -reviewed patient's medical notes -discussed her progress with getting a referral to [name] -recommended going to [facility] given darkening of skin, worsening eye closure, finding of cyst, dizziness to receive prompt neurosurgical eval regarding if Fuller is necessary, and also for pain management given worsening pain refractory to all treatments and resulting suicidality. Reviewed [facility] doc notes- he discussed with on-call neurology and NSGY and determined that patient could be discharged with outpatient follow-up. IV fentanyl was administered with relief to patient. She was discharged on fentanyl patch which has not provided relief. She is getting - no side effects- discussed increasing to and her husband is in agreement. Prior auth completed and approved. Discussed with husband starting patch on Satruday after she has worn patch for 72 hours -encouraged follow-up with Dr. Marcia Brash, husband let me know appointment has been set up 1/18.  -recommended follow-up with Dr. 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  -Discussed that turmeric  -discussed steroid taper. -continue savella,  husband feels like it is helping with her suicidal thoughts. Increase to 25mg .  -continue hydrocodone.  -discuss mouth guard with dentist.  -d/c topamax -she does have another procedure scheduled. -discussed benefits of infrared light.  -Continue Norco 5mg  TID PRN -follow-up monthly with Riley Lam and with me in February, placed on waitlist for earlier appointment with me, advised phone visits/MyChart are a great way to  communicate in the interim -discussed that she can take an additional one or two tylenol for breakthrough pain -recommended checking CMP for liver enzymes with next labs -prescribed NAC 600mg  BID to protect the liver while taking tylenol, discussed its health benefits for mitochondrial function -d/c Baclofen  -f/u with Dr. Marcia Brash and [name] 1/18 -discussed retrying carbamazepine to see if it is effective for her. It is $89 for her, so we discussed trying Lamictal instead.  -encouraged anti-inflammatory diet.  -she had sinusitis that was viral and was treated with antibiotics. This was in the early 90s. The symptoms resolved after 2 months. She uses to get sinus infections and saline rinse.  -She has had multiple root canals.  -Discontinue 100% oxygen via facemask as this was not helpful.  -Discussed that unfortunately Sprint PNS is not indicated for craniofacial pain. -Amitriptyline and Lyrica did not help.  -Continue Cymbalta 60mg .  -Topiramate helped but caused dizziness.  -Provided with hand out with information regarding trigeminal neuralgia -Failed Penytoin.  -trial lidocaine patch -She would like to try biofeedback. -reviewed neurology note.  -referred to [name].  -will try trigger point injections next visit.  -Continue Ketamine 10%, Baclofen 2%, Cyclobenzaprine 2%, Ketoprofen 10%, Gabapentin 6%, Bupivacaine 1%, Amitryptiline 5% to apply to painful areas- using 3-4 times per day.  -Can consider Meloxicam if Phenytoin does not help.  -Discussed Qutenza as an option for neuropathic pain control. Discussed that this is a capsaicin patch, stronger than capsaicin cream. Discussed that it is currently approved for diabetic peripheral neuropathy and post-herpetic neuralgia, but that it has also shown benefit in treating other forms of neuropathy. Provided patient with link to site to learn more about the patch: https://www.clark.biz/. Discussed that the patch would be  placed in office and benefits usually last 3 months. Discussed that unintended exposure to capsaicin can cause severe irritation of eyes, mucous membranes, respiratory tract, and skin, but that Qutenza is a local treatment and does not have the systemic side effects of other nerve medications. Discussed that there may be pain, itching, erythema, and decreased sensory function associated with the application of Qutenza. Side effects usually subside within 1 Fuller. A cold pack of analgesic medications can help with these side effects. Blood pressure can also be increased due to pain associated with administration of the patch.  -she cut down on her gabapentin due to her dizziness    Turmeric to reduce inflammation--can be used in cooking or taken as a supplement.  Benefits of turmeric:  -Highly anti-inflammatory  -Increases antioxidants  -Improves memory, attention, brain disease  -Lowers risk of heart disease  -May help prevent cancer  -Decreases pain  -Alleviates depression  -Delays aging and decreases risk of chronic disease  -Consume with black pepper to increase absorption    Turmeric Milk Recipe:  1 cup milk  1 tsp turmeric  1 tsp cinnamon  1 tsp grated ginger (optional)  Black pepper (boosts the anti-inflammatory properties of turmeric).  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  -she is a  good metabolizer of amitriptyline and has tolerated nortrypilline in the past, but without much benefit.    3) CKD: encouraged 6-8 glasses of water per day. She states that she had AKI with Ibuprofen in the past. Discussed checking Cr level next visit if we are considering Meloxicam -Discussed that Nucynta, and other pain medications, may stay in her bloodstream longer given her CKD   4) Hypernatremia: encouraged hydration   5) General health and pain: provided with list of healthy foods that are both nutritious and fight pain.   6) Depressed: Amitriptyline did help with this but she could not tolerate the drowsiness. Encouraged focusing on the factors that she can control, such as anti-inflammatory diet.   7) Fibromyalgia: Discussed goal to wean steroids prior to Fuller. Continue 1mg  daily. She is on that intermittently. She started this again about a month ago. She has been on this about a year. Continue Savella -discussed benefits of cold water showers.  -continue gabapentin.  -high dose vitamin D sent  8) Dizziness: -discussed that this could be from the combination of gabapentin and baclofen.  -discussed that all the medications we are trying for pain may worsen the dizziness  9) Constipation:  -Provided list of following foods that help with constipation and highlighted a few: 1) prunes- contain high amounts of fiber.  2) apples- has a form of dietary fiber called pectin that accelerates stool movement and increases beneficial gut bacteria 3) pears- in addition to fiber, also high in fructose and sorbitol which have laxative effect 4) figs- contain an enzyme ficin which helps to speed colonic transit 5) kiwis- contain an enzyme actinidin that improves gut motility and reduces constipation 6) oranges- rich in pectin (like apples) 7) grapefruits- contain a flavanol naringenin which has a laxative effect 8) vegetables- rich in fiber and also great sources of folate, vitamin C, and  K 9) artichoke- high in inulin, prebiotic great for the microbiome 10) chicory- increases stool frequency and softness (can be added to coffee) 11) rhubarb- laxative effect 12) sweet potato- high fiber 13) beans, peas, and lentils- contain both soluble and insoluble fiber 14) chia seeds- improves intestinal health and gut flora 15) flaxseeds- laxative effect 16) whole grain rye bread- high in fiber 17) oat bran- high in soluble and insoluble fiber 18) kefir- softens stools -recommended to try at least one of these foods every day.  -drink 6-8 glasses of water per day -walk regularly, especially after meals.   10) Osteopenia - was tapered off the prednisone -discussed that currently her pain may be more of a risk to her than osteopenia given her suicidal ideation from her pain -discussed risk of fall with the medications that make her dizziness.   11) Daytime sleepiness -discussed that this is secondary to her medications.  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  15) HTN: -discussed clonidine patch could potentially help with HTN and pain.  -BP is 170/90 today.  -Advised checking BP daily at home and logging results to bring into follow-up appointment with PCP and myself. -Reviewed BP meds today.  -Advised regarding healthy foods that can help lower blood pressure and provided with a list: 1) citrus foods- high in vitamins and minerals 2) salmon and other fatty fish - reduces inflammation and oxylipins 3) swiss chard (leafy green)- high level of nitrates 4) pumpkin seeds- one of the best natural sources of magnesium 5) Beans and lentils- high in fiber, magnesium, and potassium 6) Berries- high in flavonoids 7) Amaranth (whole grain, can be cooked similarly to rice and  oats)- high in magnesium and fiber 8) Pistachios- even more effective at reducing BP than other nuts 9) Carrots- high in phenolic compounds that relax blood vessels and reduce inflammation 10) Celery- contain phthalides that relax tissues of arterial walls 11) Tomatoes- can also improve cholesterol and reduce risk of heart disease 12) Broccoli- good source of magnesium, calcium, and potassium 13) Greek yogurt: high in potassium and calcium 14) Herbs and spices: Celery seed, cilantro, saffron, lemongrass, black cumin, ginseng, cinnamon, cardamom, sweet basil, and ginger 15) Chia and flax seeds- also help to lower cholesterol and blood sugar 16) Beets- high levels of nitrates that relax blood vessels  17) spinach and bananas- high in potassium  -Provided lise of supplements that can help with hypertension:  1) magnesium: one high quality brand is Bioptemizers since it contains all 7 types of magnesium, otherwise over the counter magnesium gluconate 400mg  is a good option 2) B vitamins 3) vitamin D 4) potassium 5) CoQ10 6) L-arginine 7) Vitamin C 8) Beetroot -Educated that goal BP is 120/80. -Made goal to incorporate some of the above foods into diet.    >40 minutes spent in discussion of her trigeminal neuralgia, her negative experience last time she went to [facility], discussed following up with Dr. Molli Hazard regarding her experience, discussed that her pain is currently worse, discussed that she is keeping a pain journal, discussed that she was taking the magnesium bioptemizers and that she was taking the PEA nerve supplement, that her feet started hurting after she stopped taking the PEA, discussed capsaicin and that she applies this to the two discs on her sciatic nerve, discussed the compounded gel

## 2022-10-28 NOTE — Therapy (Signed)
OUTPATIENT PHYSICAL THERAPY LOWER EXTREMITY EVALUATION   Patient Name: [name] MRN: [id number] DOB:January 14, 1941, 82 y.o., female Today's Date: 10/29/2022  END OF SESSION:  PT End of Session - 10/29/22 1330     Visit Number 1    Number of Visits 6    Date for PT Re-Evaluation 12/11/22    PT Start Time 1117    PT Stop Time 1202    PT Time Calculation (min) 45 min    Activity Tolerance Patient tolerated treatment well    Behavior During Therapy [facility] for tasks assessed/performed             Past Medical History:  Diagnosis Date   Adenomatous colon polyp 03/24/2018   Allergy    Arthritis    Cataract    Chronic headaches    Depression    Fibromyalgia    Gallstones    GERD (gastroesophageal reflux disease)    Hyperlipemia    Hypertension    Interstitial cystitis    Lactose intolerance    LBBB (left bundle branch block)    Osteoarthritis    Osteopenia    Pneumonia    Thyroid disease    Tinnitus    Trigeminal neuralgia    Past Surgical History:  Procedure Laterality Date   CHOLECYSTECTOMY  03/2010   COLONOSCOPY  2009   Dr. 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  "I have stopped and may go back this summer if I need it. Have Oa both knees have had injections from Dr August Saucer. Trigeminal nerve pain x 4 years has had 2 gamma knife treatments and now has stimulator placed in [facility] hospital"  PERTINENT HISTORY: Trigeminal neuralgia chronic PAIN:  No knee pain Trigeminal pain 10/10  Pt has stimulator is able to change program to decrease pain  PRECAUTIONS: Fall  WEIGHT BEARING RESTRICTIONS: No  FALLS:  Has patient fallen in last 6 months? No  LIVING ENVIRONMENT: Lives with: lives with their family and lives with their spouse Lives in: House/apartment Stairs: Yes: Internal: 16 steps; on right going up Has following equipment at home: None  OCCUPATION: retired  PLOF: Independent  PATIENT GOALS: more exercise that will help my knee pain  NEXT MD VISIT: end of May  OBJECTIVE:   DIAGNOSTIC FINDINGS: X-Ray 10/23 Moderate tricompartmental degenerative changes   PATIENT SURVEYS:  FOTO Risk adjusted 41% with goal of 48%  COGNITION: Overall cognitive status: Within functional  limits for tasks assessed     SENSATION: WFL  EDEMA: none  MUSCLE LENGTH:   POSTURE: forward head  PALPATION: No tenderness  LOWER EXTREMITY ROM:  Active ROM Right eval Left eval  Hip flexion    Hip extension    Hip abduction    Hip adduction    Hip internal rotation    Hip external rotation    Knee flexion 114 120  Knee extension 0 0  Ankle dorsiflexion    Ankle plantarflexion    Ankle inversion    Ankle eversion     (Blank rows = not tested)  LOWER EXTREMITY MMT:  MMT Right eval Left eval  Hip flexion 4- 4-  Hip extension    Hip abduction 5 5  Hip adduction    Hip internal  rotation    Hip external rotation    Knee flexion 4 4  Knee extension 5 5  Ankle dorsiflexion    Ankle plantarflexion    Ankle inversion    Ankle eversion     (Blank rows = not tested)    FUNCTIONAL TESTS:  5 times sit to stand: 14.11 Berg Balance Scale: 51/56  GAIT: Distance walked: 500 Assistive device utilized: None Level of assistance: Complete Independence Comments: Pt wearing patellar support right and full brace left knee. Cadence slightly slowed otherwise normal   TODAY'S TREATMENT:                                                                                                                              Eval Objective testing Foto   PATIENT EDUCATION:  Education details: Discussed eval findings, rehab rationale, aquatic program progression/POC and pools in area. Patient is in agreement   Person educated: Patient and Spouse Education method: Explanation Education comprehension: verbalized understanding  HOME EXERCISE PROGRAM: TBA  ASSESSMENT:  CLINICAL IMPRESSION: Patient is a 82 y.o. f who was seen today for physical therapy evaluation and treatment for general weakness.  She has just completed land based PT for knee OA as well as general weakness at [facility].  This order received from pulmonology stating she is interested in "water aerobic therapy". Pt instructed and VU of aquatic-PT is intended to facilitate independence with self-directed HEP and that water aerobics is very different from skilled aquatic physical therapy. She will benefit from a short duration of skilled Physical therapy intervention to instruct pt on a strengthening and balance program. Will create an HEP for pt to continue indep. She is considering membership at a pool to be able to have access.  OBJECTIVE IMPAIRMENTS: decreased balance and decreased strength.   ACTIVITY LIMITATIONS: locomotion level  PARTICIPATION LIMITATIONS: community activity and playing tennis  PERSONAL  FACTORS: Age and 1-2 comorbidities: OA and trigeminal neuralgia  are also affecting patient's functional outcome.   REHAB POTENTIAL: Good  CLINICAL DECISION MAKING: Evolving/moderate complexity  EVALUATION COMPLEXITY: Moderate   GOALS: Goals reviewed with patient? Yes  SHORT TERM  GOALS: Target date: 12/04/22 Pt will tolerate full aquatic sessions consistently without increase in pain and with improving function to demonstrate good toleration and effectiveness of intervention.   Baseline:TBA Goal status: INITIAL  2.  Pt will be indep with final aquatic HEP for continued management of condition  Baseline: TBA Goal status: INITIAL  3.  Pt will gain access to pool in order to complete aquatic HEP Baseline: no access Goal status: INITIAL  4.  Pt will tolerate exercising in pool without knee pain limitation Baseline: pain land based Goal status: INITIAL  5.  Pt will improve berg balance score to >54/56 to demonstrate carryover from balance training in pool to land Baseline: 51/56 Goal status: INITIAL  6.  Pt to meet stated Foto Goal of 48% Baseline: 41% Goal status: INITIAL  LONG TERM GOALS: Not necessary at this time   PLAN:  PT FREQUENCY: 1-2x/week  PT DURATION: 6 weeks 6 visits  PLANNED INTERVENTIONS: Therapeutic exercises, Therapeutic activity, Neuromuscular re-education, Balance training, Gait training, Patient/Family education, Self Care, Joint mobilization, Stair training, Orthotic/Fit training, DME instructions, Aquatic Therapy, Dry Needling, Electrical stimulation, Moist heat, Taping, Ionotophoresis 4mg /ml Dexamethasone, Manual therapy, and Re-evaluation  PLAN FOR NEXT SESSION: Aquatics: LE and core strengthening, balance training. Aquatic HEP (5 visits remaining)   Corrie Dandy Tomma Lightning) Ariya Bohannon MPT 10/29/2022, 1:44 PM

## 2022-10-29 ENCOUNTER — Ambulatory Visit (HOSPITAL_BASED_OUTPATIENT_CLINIC_OR_DEPARTMENT_OTHER): Payer: Medicare Other | Attending: Pulmonary Disease | Admitting: Physical Therapy

## 2022-10-29 ENCOUNTER — Other Ambulatory Visit: Payer: Self-pay

## 2022-10-29 ENCOUNTER — Encounter (HOSPITAL_BASED_OUTPATIENT_CLINIC_OR_DEPARTMENT_OTHER): Payer: Self-pay | Admitting: Physical Therapy

## 2022-10-29 DIAGNOSIS — R2681 Unsteadiness on feet: Secondary | ICD-10-CM | POA: Insufficient documentation

## 2022-10-29 DIAGNOSIS — G8929 Other chronic pain: Secondary | ICD-10-CM | POA: Insufficient documentation

## 2022-10-29 DIAGNOSIS — M25562 Pain in left knee: Secondary | ICD-10-CM | POA: Insufficient documentation

## 2022-10-29 DIAGNOSIS — M25561 Pain in right knee: Secondary | ICD-10-CM | POA: Diagnosis not present

## 2022-10-29 DIAGNOSIS — R29898 Other symptoms and signs involving the musculoskeletal system: Secondary | ICD-10-CM | POA: Insufficient documentation

## 2022-10-29 DIAGNOSIS — M6281 Muscle weakness (generalized): Secondary | ICD-10-CM | POA: Diagnosis not present

## 2022-11-05 ENCOUNTER — Other Ambulatory Visit: Payer: Self-pay | Admitting: Physical Medicine and Rehabilitation

## 2022-11-11 DIAGNOSIS — G509 Disorder of trigeminal nerve, unspecified: Secondary | ICD-10-CM | POA: Diagnosis not present

## 2022-11-13 DIAGNOSIS — Z9682 Presence of neurostimulator: Secondary | ICD-10-CM | POA: Diagnosis not present

## 2022-11-13 DIAGNOSIS — G509 Disorder of trigeminal nerve, unspecified: Secondary | ICD-10-CM | POA: Diagnosis not present

## 2022-11-24 ENCOUNTER — Encounter (HOSPITAL_BASED_OUTPATIENT_CLINIC_OR_DEPARTMENT_OTHER): Payer: Self-pay | Admitting: Physical Therapy

## 2022-11-24 ENCOUNTER — Ambulatory Visit (HOSPITAL_BASED_OUTPATIENT_CLINIC_OR_DEPARTMENT_OTHER): Payer: Medicare Other | Attending: Pulmonary Disease | Admitting: Physical Therapy

## 2022-11-24 DIAGNOSIS — M25561 Pain in right knee: Secondary | ICD-10-CM | POA: Diagnosis not present

## 2022-11-24 DIAGNOSIS — M6281 Muscle weakness (generalized): Secondary | ICD-10-CM | POA: Diagnosis not present

## 2022-11-24 DIAGNOSIS — R2681 Unsteadiness on feet: Secondary | ICD-10-CM | POA: Insufficient documentation

## 2022-11-24 DIAGNOSIS — G8929 Other chronic pain: Secondary | ICD-10-CM | POA: Diagnosis not present

## 2022-11-24 DIAGNOSIS — M25562 Pain in left knee: Secondary | ICD-10-CM | POA: Insufficient documentation

## 2022-11-24 NOTE — Therapy (Signed)
OUTPATIENT PHYSICAL THERAPY LOWER EXTREMITY EVALUATION   Patient Name: [name] MRN: [id number] DOB:03/16/1941, 82 y.o., female Today's Date: 11/24/2022  END OF SESSION:  PT End of Session - 11/24/22 1533     Visit Number 2    Number of Visits 6    Date for PT Re-Evaluation 12/11/22    PT Start Time 1532    PT Stop Time 1615    PT Time Calculation (min) 43 min    Activity Tolerance Patient tolerated treatment well    Behavior During Therapy [facility] for tasks assessed/performed             Past Medical History:  Diagnosis Date   Adenomatous colon polyp 03/24/2018   Allergy    Arthritis    Cataract    Chronic headaches    Depression    Fibromyalgia    Gallstones    GERD (gastroesophageal reflux disease)    Hyperlipemia    Hypertension    Interstitial cystitis    Lactose intolerance    LBBB (left bundle branch block)    Osteoarthritis    Osteopenia    Pneumonia    Thyroid disease    Tinnitus    Trigeminal neuralgia    Past Surgical History:  Procedure Laterality Date   CHOLECYSTECTOMY  03/2010   COLONOSCOPY  2009   Dr. Harvie Junior    CYSTOSCOPY     Multiple Cystoscopies with stents or biopsy    ESOPHAGOGASTRODUODENOSCOPY  10/2013   EYE SURGERY     bilateral cataract removal   Gamma knife radiation  01/2018   for trigeminal neuralgia   REFRACTIVE SURGERY Bilateral 2022   RHIZOTOMY Left 04/28/2021   Procedure: Left Percutaneous trigeminal balloon rhizotomy;  Surgeon: Jadene Pierini, MD;  Location: [facility] OR;  Service: Neurosurgery;  Laterality: Left;   TONSILLECTOMY     Patient Active Problem List   Diagnosis Date Noted   Decreased diffusion capacity 08/21/2018   Trigeminal neuralgia    Lumbar back pain with radiculopathy affecting right lower extremity 01/08/2014   Dizzinesses 01/08/2014    PCP: Merri Brunette MD  REFERRING PROVIDER: Luciano Cutter, MD   REFERRING DIAG: [phone] (ICD-10-CM) - Muscular deconditioning   THERAPY DIAG:   Muscle weakness (generalized)  Chronic pain of both knees  Unsteadiness on feet  Rationale for Evaluation and Treatment: Rehabilitation  ONSET DATE: >81yrs  SUBJECTIVE:   SUBJECTIVE STATEMENT: Pt reports not completing any exercises since being in [place] (~ 2 weeks).  Says she is feeling weak.  Initial Subjective Have been doing therapy since Jan at [facility].  "I have stopped and may go back this summer if I need it. Have Oa both knees have had injections from Dr August Saucer. Trigeminal nerve pain x 4 years has had 2 gamma knife treatments and now has stimulator placed in [facility]"  PERTINENT HISTORY: Trigeminal neuralgia chronic PAIN:  No knee pain standing/sitting 0/10 STS 7/10 Trigeminal pain 10/10  Pt has stimulator is able to change program to decrease pain  PRECAUTIONS: Fall  WEIGHT BEARING RESTRICTIONS: No  FALLS:  Has patient fallen in last 6 months? No  LIVING ENVIRONMENT: Lives with: lives with their family and lives with their spouse Lives in: House/apartment Stairs: Yes: Internal: 16 steps; on right going up Has following equipment at home: None  OCCUPATION: retired  PLOF: Independent  PATIENT GOALS: more exercise that will help my knee pain  NEXT MD VISIT: end of May  OBJECTIVE:   DIAGNOSTIC FINDINGS:  X-Ray 10/23 Moderate tricompartmental degenerative changes   PATIENT SURVEYS:  FOTO Risk adjusted 41% with goal of 48%  COGNITION: Overall cognitive status: Within functional limits for tasks assessed     SENSATION: WFL  EDEMA: none  MUSCLE LENGTH:   POSTURE: forward head  PALPATION: No tenderness  LOWER EXTREMITY ROM:  Active ROM Right eval Left eval  Hip flexion    Hip extension    Hip abduction    Hip adduction    Hip internal rotation    Hip external rotation    Knee flexion 114 120  Knee extension 0 0  Ankle dorsiflexion    Ankle plantarflexion    Ankle inversion    Ankle eversion     (Blank rows  = not tested)  LOWER EXTREMITY MMT:  MMT Right eval Left eval  Hip flexion 4- 4-  Hip extension    Hip abduction 5 5  Hip adduction    Hip internal rotation    Hip external rotation    Knee flexion 4 4  Knee extension 5 5  Ankle dorsiflexion    Ankle plantarflexion    Ankle inversion    Ankle eversion     (Blank rows = not tested)    FUNCTIONAL TESTS:  5 times sit to stand: 14.11 Berg Balance Scale: 51/56  GAIT: Distance walked: 500 Assistive device utilized: None Level of assistance: Complete Independence Comments: Pt wearing patellar support right and full brace left knee. Cadence slightly slowed otherwise normal   TODAY'S TREATMENT:                                                                                                                              Pt seen for aquatic therapy today.  Treatment took place in water 3.5-4.75 ft in depth at the [facility] pool. Temp of water was 91.  Pt entered/exited the pool via stairs and step to pattern with hand rail.  *intro to setting *walking forward back and side stepping unsupported multiple widths in 3.6 ft *UE support barbell: df; pf; high knee marching; hip add/abd x10. Instruction on abd bracing for improved balance control with SLS.  Cues for slowed pacing *Core engagement: Rainbow HB carry forward and back x 2 width ea  - side stepping with shoulder add/abd 2 widths. *Seated on bench: cycling; add/abd; LAQ;   - STS onto water step x 10  -1/2 noodle pull down x 10 Standing: 1/2 noodle pull down x 10  Pt requires the buoyancy and hydrostatic pressure of water for support, and to offload joints by unweighting joint load by at least 50 % in navel deep water and by at least 75-80% in chest to neck deep water.  Viscosity of the water is needed for resistance of strengthening. Water current perturbations provides challenge to standing balance requiring increased core activation.     PATIENT EDUCATION:   Education details: Discussed eval findings, rehab rationale, aquatic program progression/POC and  pools in area. Patient is in agreement   Person educated: Patient and Spouse Education method: Explanation Education comprehension: verbalized understanding  HOME EXERCISE PROGRAM: Has active program from [facility]  ASSESSMENT:  CLINICAL IMPRESSION: Pt demonstrates safety and indep in setting with therapist instructing from deck. She is directed through general strengthening and ROM exercises to assess ability and toleration to setting. Able to advance core strengthening as session progresses nicely. Instruction on abd bracing improves balance ability. Cues for slowed pacing with activities. She reports complete reduction in pain.  She is encouraged to begin gaining access to pool as she will benefit well from the properties of water to improve strength and improve and maintain functional mobility.   Initial clinical assessment Patient is a 82 y.o. f who was seen today for physical therapy evaluation and treatment for general weakness.  She has just completed land based PT for knee OA as well as general weakness at [facility].  This order received from pulmonology stating she is interested in "water aerobic therapy". Pt instructed and VU of aquatic-PT is intended to facilitate independence with self-directed HEP and that water aerobics is very different from skilled aquatic physical therapy. She will benefit from a short duration of skilled Physical therapy intervention to instruct pt on a strengthening and balance program. Will create an HEP for pt to continue indep. She is considering membership at a pool to be able to have access.  OBJECTIVE IMPAIRMENTS: decreased balance and decreased strength.   ACTIVITY LIMITATIONS: locomotion level  PARTICIPATION LIMITATIONS: community activity and playing tennis  PERSONAL FACTORS: Age and 1-2 comorbidities: OA and trigeminal neuralgia  are also  affecting patient's functional outcome.   REHAB POTENTIAL: Good  CLINICAL DECISION MAKING: Evolving/moderate complexity  EVALUATION COMPLEXITY: Moderate   GOALS: Goals reviewed with patient? Yes  SHORT TERM GOALS: Target date: 12/04/22 Pt will tolerate full aquatic sessions consistently without increase in pain and with improving function to demonstrate good toleration and effectiveness of intervention.   Baseline:TBA Goal status: INITIAL  2.  Pt will be indep with final aquatic HEP for continued management of condition  Baseline: TBA Goal status: INITIAL  3.  Pt will gain access to pool in order to complete aquatic HEP Baseline: no access Goal status: INITIAL  4.  Pt will tolerate exercising in pool without knee pain limitation Baseline: pain land based Goal status: INITIAL  5.  Pt will improve berg balance score to >54/56 to demonstrate carryover from balance training in pool to land Baseline: 51/56 Goal status: INITIAL  6.  Pt to meet stated Foto Goal of 48% Baseline: 41% Goal status: INITIAL  LONG TERM GOALS: Not necessary at this time   PLAN:  PT FREQUENCY: 1-2x/week  PT DURATION: 6 weeks 6 visits  PLANNED INTERVENTIONS: Therapeutic exercises, Therapeutic activity, Neuromuscular re-education, Balance training, Gait training, Patient/Family education, Self Care, Joint mobilization, Stair training, Orthotic/Fit training, DME instructions, Aquatic Therapy, Dry Needling, Electrical stimulation, Moist heat, Taping, Ionotophoresis 4mg /ml Dexamethasone, Manual therapy, and Re-evaluation  PLAN FOR NEXT SESSION: Aquatics: LE and core strengthening, balance training. Aquatic HEP (5 visits remaining)   Corrie Dandy Tomma Lightning) Tierney Behl MPT 11/24/2022, 3:38 PM

## 2022-11-26 ENCOUNTER — Encounter (HOSPITAL_BASED_OUTPATIENT_CLINIC_OR_DEPARTMENT_OTHER): Payer: Self-pay | Admitting: Physical Therapy

## 2022-11-26 ENCOUNTER — Ambulatory Visit (HOSPITAL_BASED_OUTPATIENT_CLINIC_OR_DEPARTMENT_OTHER): Payer: Medicare Other | Admitting: Physical Therapy

## 2022-11-26 DIAGNOSIS — G8929 Other chronic pain: Secondary | ICD-10-CM | POA: Diagnosis not present

## 2022-11-26 DIAGNOSIS — M25561 Pain in right knee: Secondary | ICD-10-CM | POA: Diagnosis not present

## 2022-11-26 DIAGNOSIS — M6281 Muscle weakness (generalized): Secondary | ICD-10-CM

## 2022-11-26 DIAGNOSIS — R2681 Unsteadiness on feet: Secondary | ICD-10-CM

## 2022-11-26 DIAGNOSIS — M25562 Pain in left knee: Secondary | ICD-10-CM | POA: Diagnosis not present

## 2022-11-26 NOTE — Therapy (Signed)
OUTPATIENT PHYSICAL THERAPY LOWER EXTREMITY EVALUATION   Patient Name: RUBIANA ANGELICO MRN: [id number] DOB:May 21, 1941, 82 y.o., female Today's Date: 11/26/2022  END OF SESSION:  PT End of Session - 11/26/22 1534     Visit Number 3    Number of Visits 6    Date for PT Re-Evaluation 12/11/22    PT Start Time 1532    PT Stop Time 1610    PT Time Calculation (min) 38 min    Activity Tolerance Patient tolerated treatment well    Behavior During Therapy [facility] for tasks assessed/performed             Past Medical History:  Diagnosis Date   Adenomatous colon polyp 03/24/2018   Allergy    Arthritis    Cataract    Chronic headaches    Depression    Fibromyalgia    Gallstones    GERD (gastroesophageal reflux disease)    Hyperlipemia    Hypertension    Interstitial cystitis    Lactose intolerance    LBBB (left bundle branch block)    Osteoarthritis    Osteopenia    Pneumonia    Thyroid disease    Tinnitus    Trigeminal neuralgia    Past Surgical History:  Procedure Laterality Date   CHOLECYSTECTOMY  03/2010   COLONOSCOPY  2009   Dr. Harvie Junior    CYSTOSCOPY     Multiple Cystoscopies with stents or biopsy    ESOPHAGOGASTRODUODENOSCOPY  10/2013   EYE SURGERY     bilateral cataract removal   Gamma knife radiation  01/2018   for trigeminal neuralgia   REFRACTIVE SURGERY Bilateral 2022   RHIZOTOMY Left 04/28/2021   Procedure: Left Percutaneous trigeminal balloon rhizotomy;  Surgeon: Jadene Pierini, MD;  Location: [facility] OR;  Service: Neurosurgery;  Laterality: Left;   TONSILLECTOMY     Patient Active Problem List   Diagnosis Date Noted   Decreased diffusion capacity 08/21/2018   Trigeminal neuralgia    Lumbar back pain with radiculopathy affecting right lower extremity 01/08/2014   Dizzinesses 01/08/2014    PCP: Merri Brunette MD  REFERRING PROVIDER: Luciano Cutter, MD   REFERRING DIAG: [phone] (ICD-10-CM) - Muscular deconditioning   THERAPY DIAG:   Muscle weakness (generalized)  Chronic pain of both knees  Unsteadiness on feet  Rationale for Evaluation and Treatment: Rehabilitation  ONSET DATE: >59yrs  SUBJECTIVE:   SUBJECTIVE STATEMENT: Pt reports muscle soreness after last session, no increase in knee pain.  She does have some increase in knee pain today from climbing stairs yesterday.  Initial Subjective Have been doing therapy since Jan at [facility].  "I have stopped and may go back this summer if I need it. Have Oa both knees have had injections from Dr August Saucer. Trigeminal nerve pain x 4 years has had 2 gamma knife treatments and now has stimulator [facility]"  PERTINENT HISTORY: Trigeminal neuralgia chronic PAIN:  No knee pain standing/sitting 4/10 STS 7/10 Trigeminal pain 10/10  Pt has stimulator is able to change program to decrease pain  PRECAUTIONS: Fall  WEIGHT BEARING RESTRICTIONS: No  FALLS:  Has patient fallen in last 6 months? No  LIVING ENVIRONMENT: Lives with: lives with their family and lives with their spouse Lives in: House/apartment Stairs: Yes: Internal: 16 steps; on right going up Has following equipment at home: None  OCCUPATION: retired  PLOF: Independent  PATIENT GOALS: more exercise that will help my knee pain  NEXT MD VISIT: end of  May  OBJECTIVE:   DIAGNOSTIC FINDINGS: X-Ray 10/23 Moderate tricompartmental degenerative changes   PATIENT SURVEYS:  FOTO Risk adjusted 41% with goal of 48%  COGNITION: Overall cognitive status: Within functional limits for tasks assessed     SENSATION: WFL  EDEMA: none  MUSCLE LENGTH:   POSTURE: forward head  PALPATION: No tenderness  LOWER EXTREMITY ROM:  Active ROM Right eval Left eval  Hip flexion    Hip extension    Hip abduction    Hip adduction    Hip internal rotation    Hip external rotation    Knee flexion 114 120  Knee extension 0 0  Ankle dorsiflexion    Ankle plantarflexion     Ankle inversion    Ankle eversion     (Blank rows = not tested)  LOWER EXTREMITY MMT:  MMT Right eval Left eval  Hip flexion 4- 4-  Hip extension    Hip abduction 5 5  Hip adduction    Hip internal rotation    Hip external rotation    Knee flexion 4 4  Knee extension 5 5  Ankle dorsiflexion    Ankle plantarflexion    Ankle inversion    Ankle eversion     (Blank rows = not tested)    FUNCTIONAL TESTS:  5 times sit to stand: 14.11 Berg Balance Scale: 51/56  GAIT: Distance walked: 500 Assistive device utilized: None Level of assistance: Complete Independence Comments: Pt wearing patellar support right and full brace left knee. Cadence slightly slowed otherwise normal   TODAY'S TREATMENT:                                                                                                                              Pt seen for aquatic therapy today.  Treatment took place in water 3.5-4.75 ft in depth at the [facility] pool. Temp of water was 91.  Pt entered/exited the pool via stairs and step to pattern with hand rail.  *walking forward back and side stepping unsupported multiple widths in 3.6 ft *Core engagement: Rainbow HB carry forward and back x 2 width ea  - side stepping with shoulder add/abd 2 widths. *Seated on bench: cycling; add/abd; LAQ; 2 x 20 cues for increased speed as tolerated for increased resistance  - STS onto water step x 10  -1/2 noodle pull down x 10 Standing: 1/2 noodle pull down x 10 with LE staggered. *UE support 1/2 noodle 3.39ft: df; pf; high knee marching; hip add/abd x10. Cues for abd bracing for improve core control and balance.  Pt requires the buoyancy and hydrostatic pressure of water for support, and to offload joints by unweighting joint load by at least 50 % in navel deep water and by at least 75-80% in chest to neck deep water.  Viscosity of the water is needed for resistance of strengthening. Water current perturbations  provides challenge to standing balance requiring increased core activation.  PATIENT EDUCATION:  Education details: Discussed eval findings, rehab rationale, aquatic program progression/POC and pools in area. Patient is in agreement   Person educated: Patient and Spouse Education method: Explanation Education comprehension: verbalized understanding  HOME EXERCISE PROGRAM: Has active program from [employer]  ASSESSMENT:  CLINICAL IMPRESSION: Slight progression in program as tolerated particularly with core engagement.  She completes well with vc and demonstration. Execution improved. Difficulty and slight frustration with balance. Goals ongoing    Initial clinical assessment Patient is a 82 y.o. f who was seen today for physical therapy evaluation and treatment for general weakness.  She has just completed land based PT for knee OA as well as general weakness at [facility].  This order received from pulmonology stating she is interested in "water aerobic therapy". Pt instructed and VU of aquatic-PT is intended to facilitate independence with self-directed HEP and that water aerobics is very different from skilled aquatic physical therapy. She will benefit from a short duration of skilled Physical therapy intervention to instruct pt on a strengthening and balance program. Will create an HEP for pt to continue indep. She is considering membership at a pool to be able to have access.  OBJECTIVE IMPAIRMENTS: decreased balance and decreased strength.   ACTIVITY LIMITATIONS: locomotion level  PARTICIPATION LIMITATIONS: community activity and playing tennis  PERSONAL FACTORS: Age and 1-2 comorbidities: OA and trigeminal neuralgia  are also affecting patient's functional outcome.   REHAB POTENTIAL: Good  CLINICAL DECISION MAKING: Evolving/moderate complexity  EVALUATION COMPLEXITY: Moderate   GOALS: Goals reviewed with patient? Yes  SHORT TERM GOALS: Target date: 12/04/22 Pt  will tolerate full aquatic sessions consistently without increase in pain and with improving function to demonstrate good toleration and effectiveness of intervention.   Baseline:TBA Goal status: INITIAL  2.  Pt will be indep with final aquatic HEP for continued management of condition  Baseline: TBA Goal status: INITIAL  3.  Pt will gain access to pool in order to complete aquatic HEP Baseline: no access Goal status: INITIAL  4.  Pt will tolerate exercising in pool without knee pain limitation Baseline: pain land based Goal status: INITIAL  5.  Pt will improve berg balance score to >54/56 to demonstrate carryover from balance training in pool to land Baseline: 51/56 Goal status: INITIAL  6.  Pt to meet stated Foto Goal of 48% Baseline: 41% Goal status: INITIAL  LONG TERM GOALS: Not necessary at this time   PLAN:  PT FREQUENCY: 1-2x/week  PT DURATION: 6 weeks 6 visits  PLANNED INTERVENTIONS: Therapeutic exercises, Therapeutic activity, Neuromuscular re-education, Balance training, Gait training, Patient/Family education, Self Care, Joint mobilization, Stair training, Orthotic/Fit training, DME instructions, Aquatic Therapy, Dry Needling, Electrical stimulation, Moist heat, Taping, Ionotophoresis 4mg /ml Dexamethasone, Manual therapy, and Re-evaluation  PLAN FOR NEXT SESSION: Aquatics: LE and core strengthening, balance training. Aquatic HEP (5 visits remaining)   Corrie Dandy Tomma Lightning) Rawleigh Rode MPT 11/26/2022, 4:11 PM

## 2022-12-01 ENCOUNTER — Encounter (HOSPITAL_BASED_OUTPATIENT_CLINIC_OR_DEPARTMENT_OTHER): Payer: Self-pay | Admitting: Physical Therapy

## 2022-12-01 ENCOUNTER — Ambulatory Visit (HOSPITAL_BASED_OUTPATIENT_CLINIC_OR_DEPARTMENT_OTHER): Payer: Medicare Other | Admitting: Physical Therapy

## 2022-12-01 DIAGNOSIS — M25561 Pain in right knee: Secondary | ICD-10-CM | POA: Diagnosis not present

## 2022-12-01 DIAGNOSIS — M25562 Pain in left knee: Secondary | ICD-10-CM | POA: Diagnosis not present

## 2022-12-01 DIAGNOSIS — R2681 Unsteadiness on feet: Secondary | ICD-10-CM | POA: Diagnosis not present

## 2022-12-01 DIAGNOSIS — G8929 Other chronic pain: Secondary | ICD-10-CM

## 2022-12-01 DIAGNOSIS — M6281 Muscle weakness (generalized): Secondary | ICD-10-CM

## 2022-12-01 NOTE — Therapy (Signed)
OUTPATIENT PHYSICAL THERAPY LOWER EXTREMITY TREATMENT   Patient Name: [name] MRN: [id number] DOB:06-Mar-1941, 82 y.o., female Today's Date: 12/01/2022  END OF SESSION:  PT End of Session - 12/01/22 1528     Visit Number 4    Number of Visits 6    Date for PT Re-Evaluation 12/11/22    PT Start Time 1530    PT Stop Time 1614    PT Time Calculation (min) 44 min    Activity Tolerance Patient tolerated treatment well    Behavior During Therapy [facility] for tasks assessed/performed             Past Medical History:  Diagnosis Date   Adenomatous colon polyp 03/24/2018   Allergy    Arthritis    Cataract    Chronic headaches    Depression    Fibromyalgia    Gallstones    GERD (gastroesophageal reflux disease)    Hyperlipemia    Hypertension    Interstitial cystitis    Lactose intolerance    LBBB (left bundle branch block)    Osteoarthritis    Osteopenia    Pneumonia    Thyroid disease    Tinnitus    Trigeminal neuralgia    Past Surgical History:  Procedure Laterality Date   CHOLECYSTECTOMY  03/2010   COLONOSCOPY  2009   Dr. Harvie Junior    CYSTOSCOPY     Multiple Cystoscopies with stents or biopsy    ESOPHAGOGASTRODUODENOSCOPY  10/2013   EYE SURGERY     bilateral cataract removal   Gamma knife radiation  01/2018   for trigeminal neuralgia   REFRACTIVE SURGERY Bilateral 2022   RHIZOTOMY Left 04/28/2021   Procedure: Left Percutaneous trigeminal balloon rhizotomy;  Surgeon: Jadene Pierini, MD;  Location: [facility] OR;  Service: Neurosurgery;  Laterality: Left;   TONSILLECTOMY     Patient Active Problem List   Diagnosis Date Noted   Decreased diffusion capacity 08/21/2018   Trigeminal neuralgia    Lumbar back pain with radiculopathy affecting right lower extremity 01/08/2014   Dizzinesses 01/08/2014    PCP: Merri Brunette MD  REFERRING PROVIDER: Luciano Cutter, MD   REFERRING DIAG: [phone] (ICD-10-CM) - Muscular deconditioning   THERAPY DIAG:   Muscle weakness (generalized)  Chronic pain of both knees  Unsteadiness on feet  Rationale for Evaluation and Treatment: Rehabilitation  ONSET DATE: >98yrs  SUBJECTIVE:   SUBJECTIVE STATEMENT: Pt reports hip area discomfort after last session no increase in knee pain.  Feels like her strength is improving  Initial Subjective Have been doing therapy since Jan at [facility].  "I have stopped and may go back this summer if I need it. Have Oa both knees have had injections from Dr August Saucer. Trigeminal nerve pain x 4 years has had 2 gamma knife treatments and now [facility]"  PERTINENT HISTORY: Trigeminal neuralgia chronic PAIN:  No knee pain standing/sitting 4/10 STS 7/10 Trigeminal pain 10/10  Pt has stimulator is able to change program to decrease pain  PRECAUTIONS: Fall  WEIGHT BEARING RESTRICTIONS: No  FALLS:  Has patient fallen in last 6 months? No  LIVING ENVIRONMENT: Lives with: lives with their family and lives with their spouse Lives in: House/apartment Stairs: Yes: Internal: 16 steps; on right going up Has following equipment at home: None  OCCUPATION: retired  PLOF: Independent  PATIENT GOALS: more exercise that will help my knee pain  NEXT MD VISIT: end of May  OBJECTIVE:   DIAGNOSTIC  FINDINGS: X-Ray 10/23 Moderate tricompartmental degenerative changes   PATIENT SURVEYS:  FOTO Risk adjusted 41% with goal of 48%  COGNITION: Overall cognitive status: Within functional limits for tasks assessed     SENSATION: WFL  EDEMA: none  MUSCLE LENGTH:   POSTURE: forward head  PALPATION: No tenderness  LOWER EXTREMITY ROM:  Active ROM Right eval Left eval  Hip flexion    Hip extension    Hip abduction    Hip adduction    Hip internal rotation    Hip external rotation    Knee flexion 114 120  Knee extension 0 0  Ankle dorsiflexion    Ankle plantarflexion    Ankle inversion    Ankle eversion      (Blank rows = not tested)  LOWER EXTREMITY MMT:  MMT Right eval Left eval  Hip flexion 4- 4-  Hip extension    Hip abduction 5 5  Hip adduction    Hip internal rotation    Hip external rotation    Knee flexion 4 4  Knee extension 5 5  Ankle dorsiflexion    Ankle plantarflexion    Ankle inversion    Ankle eversion     (Blank rows = not tested)    FUNCTIONAL TESTS:  5 times sit to stand: 14.11 Berg Balance Scale: 51/56  GAIT: Distance walked: 500 Assistive device utilized: None Level of assistance: Complete Independence Comments: Pt wearing patellar support right and full brace left knee. Cadence slightly slowed otherwise normal   TODAY'S TREATMENT:                                                                                                                              Pt seen for aquatic therapy today.  Treatment took place in water 3.5-4.75 ft in depth at the [facility] pool. Temp of water was 91.  Pt entered/exited the pool via stairs and step to pattern with hand rail.  *walking forward back and side stepping unsupported multiple widths in 3.8 ft *Core engagement:1/2 noodle pull down x 10 ea wide stance then staggered  - Rainbow HB carry forward and back x 2 width ea  - side stepping with shoulder add/abd 2 widths added rainbow HB. Cues for core stabilization *Seated on bench: cycling; add/abd; LAQ; 2 x 20 cues for increased speed as tolerated for increased resistance  - STS onto water step x 10. Cues for hip hinge and controled deceleration.  Balance: feet together, then semi tandem able to hold x 20s; full tandem and SLS require ue support HB. *UE support wall 3.30ft: high knee marching; hip add/abd x10; hip ext. Cues for abd bracing for improve core control and balance.   Pt requires the buoyancy and hydrostatic pressure of water for support, and to offload joints by unweighting joint load by at least 50 % in navel deep water and by at least 75-80% in  chest to neck deep water.  Viscosity of  the water is needed for resistance of strengthening. Water current perturbations provides challenge to standing balance requiring increased core activation.     PATIENT EDUCATION:  Education details: Discussed eval findings, rehab rationale, aquatic program progression/POC and pools in area. Patient is in agreement   Person educated: Patient and Spouse Education method: Explanation Education comprehension: verbalized understanding  HOME EXERCISE PROGRAM: Has active program from [facility]  ASSESSMENT:  CLINICAL IMPRESSION: Focus on core strength and balance. Progressed intensity with added resistance (foam) which she tolerates well/good challenge.  Difficulty with balance SLS and tandem requires ue support. Goals ongoing    Initial clinical assessment Patient is a 82 y.o. f who was seen today for physical therapy evaluation and treatment for general weakness.  She has just completed land based PT for knee OA as well as general weakness at [facility].  This order received from pulmonology stating she is interested in "water aerobic therapy". Pt instructed and VU of aquatic-PT is intended to facilitate independence with self-directed HEP and that water aerobics is very different from skilled aquatic physical therapy. She will benefit from a short duration of skilled Physical therapy intervention to instruct pt on a strengthening and balance program. Will create an HEP for pt to continue indep. She is considering membership at a pool to be able to have access.  OBJECTIVE IMPAIRMENTS: decreased balance and decreased strength.   ACTIVITY LIMITATIONS: locomotion level  PARTICIPATION LIMITATIONS: community activity and playing tennis  PERSONAL FACTORS: Age and 1-2 comorbidities: OA and trigeminal neuralgia  are also affecting patient's functional outcome.   REHAB POTENTIAL: Good  CLINICAL DECISION MAKING: Evolving/moderate complexity  EVALUATION  COMPLEXITY: Moderate   GOALS: Goals reviewed with patient? Yes  SHORT TERM GOALS: Target date: 12/04/22 Pt will tolerate full aquatic sessions consistently without increase in pain and with improving function to demonstrate good toleration and effectiveness of intervention.   Baseline:TBA Goal status: INITIAL  2.  Pt will be indep with final aquatic HEP for continued management of condition  Baseline: TBA Goal status: INITIAL  3.  Pt will gain access to pool in order to complete aquatic HEP Baseline: no access Goal status: INITIAL  4.  Pt will tolerate exercising in pool without knee pain limitation Baseline: pain land based Goal status: INITIAL  5.  Pt will improve berg balance score to >54/56 to demonstrate carryover from balance training in pool to land Baseline: 51/56 Goal status: INITIAL  6.  Pt to meet stated Foto Goal of 48% Baseline: 41% Goal status: INITIAL  LONG TERM GOALS: Not necessary at this time   PLAN:  PT FREQUENCY: 1-2x/week  PT DURATION: 6 weeks 6 visits  PLANNED INTERVENTIONS: Therapeutic exercises, Therapeutic activity, Neuromuscular re-education, Balance training, Gait training, Patient/Family education, Self Care, Joint mobilization, Stair training, Orthotic/Fit training, DME instructions, Aquatic Therapy, Dry Needling, Electrical stimulation, Moist heat, Taping, Ionotophoresis 4mg /ml Dexamethasone, Manual therapy, and Re-evaluation  PLAN FOR NEXT SESSION: Aquatics: LE and core strengthening, balance training. Aquatic HEP (5 visits remaining)   Corrie Dandy Tomma Lightning) Eron Goble MPT 12/01/2022, 5:02 PM

## 2022-12-04 ENCOUNTER — Ambulatory Visit (HOSPITAL_BASED_OUTPATIENT_CLINIC_OR_DEPARTMENT_OTHER): Payer: Medicare Other | Admitting: Physical Therapy

## 2022-12-04 ENCOUNTER — Encounter (HOSPITAL_BASED_OUTPATIENT_CLINIC_OR_DEPARTMENT_OTHER): Payer: Self-pay | Admitting: Physical Therapy

## 2022-12-04 DIAGNOSIS — R2681 Unsteadiness on feet: Secondary | ICD-10-CM

## 2022-12-04 DIAGNOSIS — Z124 Encounter for screening for malignant neoplasm of cervix: Secondary | ICD-10-CM | POA: Diagnosis not present

## 2022-12-04 DIAGNOSIS — M6281 Muscle weakness (generalized): Secondary | ICD-10-CM | POA: Diagnosis not present

## 2022-12-04 DIAGNOSIS — N952 Postmenopausal atrophic vaginitis: Secondary | ICD-10-CM | POA: Diagnosis not present

## 2022-12-04 DIAGNOSIS — G8929 Other chronic pain: Secondary | ICD-10-CM

## 2022-12-04 DIAGNOSIS — M25561 Pain in right knee: Secondary | ICD-10-CM | POA: Diagnosis not present

## 2022-12-04 DIAGNOSIS — M25562 Pain in left knee: Secondary | ICD-10-CM | POA: Diagnosis not present

## 2022-12-04 DIAGNOSIS — Z1231 Encounter for screening mammogram for malignant neoplasm of breast: Secondary | ICD-10-CM | POA: Diagnosis not present

## 2022-12-04 DIAGNOSIS — Z6824 Body mass index (BMI) 24.0-24.9, adult: Secondary | ICD-10-CM | POA: Diagnosis not present

## 2022-12-04 NOTE — Therapy (Signed)
OUTPATIENT PHYSICAL THERAPY LOWER EXTREMITY TREATMENT   Patient Name: GISELL NUERNBERGER MRN: [id number] DOB:12-18-1940, 82 y.o., female Today's Date: 12/04/2022  END OF SESSION:  PT End of Session - 12/04/22 1536     Visit Number 5    Number of Visits 6    Date for PT Re-Evaluation 12/11/22    PT Start Time 1525    PT Stop Time 1605    PT Time Calculation (min) 40 min    Behavior During Therapy [facility] for tasks assessed/performed             Past Medical History:  Diagnosis Date   Adenomatous colon polyp 03/24/2018   Allergy    Arthritis    Cataract    Chronic headaches    Depression    Fibromyalgia    Gallstones    GERD (gastroesophageal reflux disease)    Hyperlipemia    Hypertension    Interstitial cystitis    Lactose intolerance    LBBB (left bundle branch block)    Osteoarthritis    Osteopenia    Pneumonia    Thyroid disease    Tinnitus    Trigeminal neuralgia    Past Surgical History:  Procedure Laterality Date   CHOLECYSTECTOMY  03/2010   COLONOSCOPY  2009   Dr. Harvie Junior    CYSTOSCOPY     Multiple Cystoscopies with stents or biopsy    ESOPHAGOGASTRODUODENOSCOPY  10/2013   EYE SURGERY     bilateral cataract removal   Gamma knife radiation  01/2018   for trigeminal neuralgia   REFRACTIVE SURGERY Bilateral 2022   RHIZOTOMY Left 04/28/2021   Procedure: Left Percutaneous trigeminal balloon rhizotomy;  Surgeon: Jadene Pierini, MD;  Location: [facility] OR;  Service: Neurosurgery;  Laterality: Left;   TONSILLECTOMY     Patient Active Problem List   Diagnosis Date Noted   Decreased diffusion capacity 08/21/2018   Trigeminal neuralgia    Lumbar back pain with radiculopathy affecting right lower extremity 01/08/2014   Dizzinesses 01/08/2014    PCP: Merri Brunette MD  REFERRING PROVIDER: Luciano Cutter, MD   REFERRING DIAG: [phone] (ICD-10-CM) - Muscular deconditioning   THERAPY DIAG:  Muscle weakness (generalized)  Chronic pain of both  knees  Unsteadiness on feet  Rationale for Evaluation and Treatment: Rehabilitation  ONSET DATE: >75yrs  SUBJECTIVE:   SUBJECTIVE STATEMENT: Pt reports she had some soreness the day after her last session. She is interested in joining [employer].   "I feel stronger out of the pool, just walking and stepping up on curb". "I feel more confident and flexible" - since starting therapy.   Initial Subjective Have been doing therapy since Jan at [facility].  "I have stopped and may go back this summer if I need it. Have Oa both knees have had injections from Dr August Saucer. Trigeminal nerve pain x 4 years has had 2 gamma knife treatments and now has stimulator [facility] in [facility]"  PERTINENT HISTORY: Trigeminal neuralgia chronic PAIN:  5/10 - in back and L knee    Pt has stimulator is able to change program to decrease pain  PRECAUTIONS: Fall  WEIGHT BEARING RESTRICTIONS: No  FALLS:  Has patient fallen in last 6 months? No  LIVING ENVIRONMENT: Lives with: lives with their family and lives with their spouse Lives in: House/apartment Stairs: Yes: Internal: 16 steps; on right going up Has following equipment at home: None  OCCUPATION: retired  PLOF: Independent  PATIENT GOALS: more exercise that will help  my knee pain  NEXT MD VISIT: end of May  OBJECTIVE:   DIAGNOSTIC FINDINGS: X-Ray 10/23 Moderate tricompartmental degenerative changes   PATIENT SURVEYS:  FOTO Risk adjusted 41% with goal of 48% 12/04/22:  FOTO  51%  COGNITION: Overall cognitive status: Within functional limits for tasks assessed     SENSATION: WFL  EDEMA: none  MUSCLE LENGTH:   POSTURE: forward head  PALPATION: No tenderness  LOWER EXTREMITY ROM:  Active ROM Right eval Left eval  Hip flexion    Hip extension    Hip abduction    Hip adduction    Hip internal rotation    Hip external rotation    Knee flexion 114 120  Knee extension 0 0  Ankle dorsiflexion    Ankle  plantarflexion    Ankle inversion    Ankle eversion     (Blank rows = not tested)  LOWER EXTREMITY MMT:  MMT Right eval Left eval  Hip flexion 4- 4-  Hip extension    Hip abduction 5 5  Hip adduction    Hip internal rotation    Hip external rotation    Knee flexion 4 4  Knee extension 5 5  Ankle dorsiflexion    Ankle plantarflexion    Ankle inversion    Ankle eversion     (Blank rows = not tested)    FUNCTIONAL TESTS:  5 times sit to stand: 14.11 Berg Balance Scale: 51/56  GAIT: Distance walked: 500 Assistive device utilized: None Level of assistance: Complete Independence Comments: Pt wearing patellar support right and full brace left knee. Cadence slightly slowed otherwise normal   TODAY'S TREATMENT:                                                                                                                              Pt seen for aquatic therapy today.  Treatment took place in water 3.5-4.75 ft in depth at the [facility] pool. Temp of water was 91.  Pt entered/exited the pool via stairs and step to pattern with hand rail. FOTO *unsupported: walking forward/  backward multiple widths in 3.8 ft * side stepping with shoulder add/abd 2 laps added rainbow hand floats.  * core engaged with rainbow hand floats under water at side - backward/forward 1 lap * tandem gait forward/ backward - with rainbow hand floats, then with hands out of water * Core engagement:1/2 noodle pull down x 10 each wide stance, then staggered * staggered stance with kick board row 2 x 10 with increased speed * return to walking forward/ backward with reciprocal arm swing * hip hinge with forward arm reach (UE on yellow hand floats), hip bump to wall x 6 (difficulty with technique) *UE on yellow hand floats: SLS with 3 way toe tap x 5 each LE;  3 way LE kick   Pt requires the buoyancy and hydrostatic pressure of water for support, and to offload joints by unweighting joint load  by  at least 50 % in navel deep water and by at least 75-80% in chest to neck deep water.  Viscosity of the water is needed for resistance of strengthening. Water current perturbations provides challenge to standing balance requiring increased core activation.     PATIENT EDUCATION:  Education details: aquatic therapy exercise progressions/modifications Person educated: Patient and Spouse Education method: Explanation Education comprehension: verbalized understanding  HOME EXERCISE PROGRAM: Has active program from [employer]  ASSESSMENT:  CLINICAL IMPRESSION: Pt's FOTO score increased to 51%; has met STG 6.  She reported elimination of Lt knee pain, but consistent pain in Rt SI area while exercising in the water.  Pt is interested in returning to swimming for exercise; therapist may trial some strokes in lap pool next visit to test tolerance.  She had difficulty with exercises with narrow base of support and in staggered stance.  PT to assess goals next visit, as it is visit 6 of 6.      Initial clinical assessment Patient is a 82 y.o. f who was seen today for physical therapy evaluation and treatment for general weakness.  She has just completed land based PT for knee OA as well as general weakness at [facility].  This order received from pulmonology stating she is interested in "water aerobic therapy". Pt instructed and VU of aquatic-PT is intended to facilitate independence with self-directed HEP and that water aerobics is very different from skilled aquatic physical therapy. She will benefit from a short duration of skilled Physical therapy intervention to instruct pt on a strengthening and balance program. Will create an HEP for pt to continue indep. She is considering membership at a pool to be able to have access.  OBJECTIVE IMPAIRMENTS: decreased balance and decreased strength.   ACTIVITY LIMITATIONS: locomotion level  PARTICIPATION LIMITATIONS: community activity and playing  tennis  PERSONAL FACTORS: Age and 1-2 comorbidities: OA and trigeminal neuralgia  are also affecting patient's functional outcome.   REHAB POTENTIAL: Good  CLINICAL DECISION MAKING: Evolving/moderate complexity  EVALUATION COMPLEXITY: Moderate   GOALS: Goals reviewed with patient? Yes  SHORT TERM GOALS: Target date: 12/04/22 Pt will tolerate full aquatic sessions consistently without increase in pain and with improving function to demonstrate good toleration and effectiveness of intervention.   Baseline:TBA Goal status: INITIAL  2.  Pt will be indep with final aquatic HEP for continued management of condition  Baseline: TBA Goal status: INITIAL  3.  Pt will gain access to pool in order to complete aquatic HEP Baseline: no access Goal status:In progress - 12/04/22  4.  Pt will tolerate exercising in pool without knee pain limitation Baseline: pain land based Goal status: MET - 12/04/22  5.  Pt will improve berg balance score to >54/56 to demonstrate carryover from balance training in pool to land Baseline: 51/56 Goal status: INITIAL  6.  Pt to meet stated Foto Goal of 48% Baseline: 41% Goal status: MET -12/04/22  LONG TERM GOALS: Not necessary at this time   PLAN:  PT FREQUENCY: 1-2x/week  PT DURATION: 6 weeks 6 visits  PLANNED INTERVENTIONS: Therapeutic exercises, Therapeutic activity, Neuromuscular re-education, Balance training, Gait training, Patient/Family education, Self Care, Joint mobilization, Stair training, Orthotic/Fit training, DME instructions, Aquatic Therapy, Dry Needling, Electrical stimulation, Moist heat, Taping, Ionotophoresis 4mg /ml Dexamethasone, Manual therapy, and Re-evaluation  PLAN FOR NEXT SESSION: Aquatics: LE and core strengthening, balance training. Aquatic HEP (1 visits remaining)  Mayer Camel, PTA 12/04/22 4:49 PM [facility]  [address] [place], [place], [address] Phone:  [phone]   Fax:  [phone]

## 2022-12-08 ENCOUNTER — Ambulatory Visit (HOSPITAL_BASED_OUTPATIENT_CLINIC_OR_DEPARTMENT_OTHER): Payer: Medicare Other | Admitting: Physical Therapy

## 2022-12-08 ENCOUNTER — Encounter (HOSPITAL_BASED_OUTPATIENT_CLINIC_OR_DEPARTMENT_OTHER): Payer: Self-pay | Admitting: Physical Therapy

## 2022-12-08 DIAGNOSIS — R2681 Unsteadiness on feet: Secondary | ICD-10-CM | POA: Diagnosis not present

## 2022-12-08 DIAGNOSIS — G8929 Other chronic pain: Secondary | ICD-10-CM | POA: Diagnosis not present

## 2022-12-08 DIAGNOSIS — M6281 Muscle weakness (generalized): Secondary | ICD-10-CM | POA: Diagnosis not present

## 2022-12-08 DIAGNOSIS — M25561 Pain in right knee: Secondary | ICD-10-CM | POA: Diagnosis not present

## 2022-12-08 DIAGNOSIS — M25562 Pain in left knee: Secondary | ICD-10-CM | POA: Diagnosis not present

## 2022-12-08 NOTE — Therapy (Signed)
OUTPATIENT PHYSICAL THERAPY LOWER EXTREMITY PROGRESS NOTE  Progress Note Reporting Period 10/29/22 to 12/08/22  See note below for Objective Data and Assessment of Progress/Goals.     Patient Name: [name] MRN: [id number] DOB:05-24-1941, 82 y.o., female Today's Date: 12/08/2022  END OF SESSION:  PT End of Session - 12/08/22 1105     Visit Number 6    Number of Visits 10    Date for PT Re-Evaluation 01/12/23    PT Start Time 1112    PT Stop Time 1155    PT Time Calculation (min) 43 min    Behavior During Therapy [facility] for tasks assessed/performed             Past Medical History:  Diagnosis Date   Adenomatous colon polyp 03/24/2018   Allergy    Arthritis    Cataract    Chronic headaches    Depression    Fibromyalgia    Gallstones    GERD (gastroesophageal reflux disease)    Hyperlipemia    Hypertension    Interstitial cystitis    Lactose intolerance    LBBB (left bundle branch block)    Osteoarthritis    Osteopenia    Pneumonia    Thyroid disease    Tinnitus    Trigeminal neuralgia    Past Surgical History:  Procedure Laterality Date   CHOLECYSTECTOMY  03/2010   COLONOSCOPY  2009   Dr. Harvie Junior    CYSTOSCOPY     Multiple Cystoscopies with stents or biopsy    ESOPHAGOGASTRODUODENOSCOPY  10/2013   EYE SURGERY     bilateral cataract removal   Gamma knife radiation  01/2018   for trigeminal neuralgia   REFRACTIVE SURGERY Bilateral 2022   RHIZOTOMY Left 04/28/2021   Procedure: Left Percutaneous trigeminal balloon rhizotomy;  Surgeon: Jadene Pierini, MD;  Location: [facility] OR;  Service: Neurosurgery;  Laterality: Left;   TONSILLECTOMY     Patient Active Problem List   Diagnosis Date Noted   Decreased diffusion capacity 08/21/2018   Trigeminal neuralgia    Lumbar back pain with radiculopathy affecting right lower extremity 01/08/2014   Dizzinesses 01/08/2014    PCP: Merri Brunette MD  REFERRING PROVIDER: Luciano Cutter, MD    REFERRING DIAG: [phone] (ICD-10-CM) - Muscular deconditioning   THERAPY DIAG:  Muscle weakness (generalized)  Chronic pain of both knees  Unsteadiness on feet  Rationale for Evaluation and Treatment: Rehabilitation  ONSET DATE: >56yrs  SUBJECTIVE:   SUBJECTIVE STATEMENT: Pt reports she is a little stiff today.  Initial Subjective Have been doing therapy since Jan at [facility].  "I have stopped and may go back this summer if I need it. Have Oa both knees have had injections from Dr August Saucer. Trigeminal nerve pain x 4 years has had 2 gamma knife treatments and now [facility] stimul[facility]"  PERTINENT HISTORY: Trigeminal neuralgia chronic PAIN:  4/10 - in back and L knee    Pt has stimulator is able to change program to decrease pain  PRECAUTIONS: Fall  WEIGHT BEARING RESTRICTIONS: No  FALLS:  Has patient fallen in last 6 months? No  LIVING ENVIRONMENT: Lives with: lives with their family and lives with their spouse Lives in: House/apartment Stairs: Yes: Internal: 16 steps; on right going up Has following equipment at home: None  OCCUPATION: retired  PLOF: Independent  PATIENT GOALS: more exercise that will help my knee pain  NEXT MD VISIT: end of May  OBJECTIVE:   DIAGNOSTIC  FINDINGS: X-Ray 10/23 Moderate tricompartmental degenerative changes   PATIENT SURVEYS:  FOTO Risk adjusted 41% with goal of 48% 12/04/22:  FOTO  51%  COGNITION: Overall cognitive status: Within functional limits for tasks assessed     SENSATION: WFL  EDEMA: none  MUSCLE LENGTH:   POSTURE: forward head  PALPATION: No tenderness  LOWER EXTREMITY ROM:  Active ROM Right eval Left eval Right  Hip flexion     Hip extension     Hip abduction     Hip adduction     Hip internal rotation     Hip external rotation     Knee flexion 114 120 130/ 125d  Knee extension 0 0   Ankle dorsiflexion     Ankle plantarflexion     Ankle inversion      Ankle eversion      (Blank rows = not tested)  LOWER EXTREMITY MMT:  MMT Right eval Left eval Right/ Left  Hip flexion 4- 4- 4  Hip extension     Hip abduction 5 5   Hip adduction     Hip internal rotation     Hip external rotation     Knee flexion 4 4 5   Knee extension 5 5   Ankle dorsiflexion     Ankle plantarflexion     Ankle inversion     Ankle eversion      (Blank rows = not tested)    FUNCTIONAL TESTS:  5 times sit to stand: 14.11 Berg Balance Scale: 51/56  12/08/22 5 x STS:15 Berg:52/56  GAIT: Distance walked: 500 Assistive device utilized: None Level of assistance: Complete Independence Comments: Pt wearing patellar support right and full brace left knee. Cadence slightly slowed otherwise normal   TODAY'S TREATMENT:                             Functional testing completed                                                                                                    Pt seen for aquatic therapy today.  Treatment took place in water 3.5-4.75 ft in depth at the [facility] pool. Temp of water was 91.  Pt entered/exited the pool via stairs and step to pattern with hand rail.  Exercises - Hand Buoy Carry  - 1 x daily - 7 x weekly - 3 sets - 10 reps - Side lunge with hand buoys  - 1 x daily - 7 x weekly - 3 sets - 10 reps - Braided Sidestepping with Arms Out  - 1 x daily - 7 x weekly - 3 sets - 10 reps - Noodle press  - 1 x daily - 7 x weekly - 3 sets - 10 reps - Standing 'L' Stretch at [employer]  - 1 x daily - 7 x weekly - 3 sets - 10 reps - Heel Toe Raises at Pool Wall  - 1 x daily - 7 x weekly - 3 sets - 10 reps - Standing Hip Abduction  Adduction at [facility]   Pt requires the buoyancy and hydrostatic pressure of water for support, and to offload joints by unweighting joint load by at least 50 % in navel deep water and by at least 75-80% in chest to neck deep water.  Viscosity of the water is needed for resistance of strengthening. Water  current perturbations provides challenge to standing balance requiring increased core activation.     PATIENT EDUCATION:  Education details: aquatic therapy exercise progressions/modifications Person educated: Patient and Spouse Education method: Explanation Education comprehension: verbalized understanding  HOME EXERCISE PROGRAM: Has active program from [employer]  Aquatics Access Code: [email or web address] URL: [email or web address] Date: 12/08/2022 Prepared by: Geni Bers This aquatic home exercise program from [employer] utilizes pictures from land based exercises, but has been adapted prior to lamination and issuance.    Exercises - Hand Buoy Carry  - 1 x daily - 7 x weekly - 3 sets - 10 reps - Side lunge with hand buoys  - 1 x daily - 7 x weekly - 3 sets - 10 reps - Braided Sidestepping with Arms Out  - 1 x daily - 7 x weekly - 3 sets - 10 reps - Noodle press  - 1 x daily - 7 x weekly - 3 sets - 10 reps - Standing 'L' Stretch at [employer]  - 1 x daily - 7 x weekly - 3 sets - 10 reps - Heel Toe Raises at Pool Wall  - 1 x daily - 7 x weekly - 3 sets - 10 reps - Standing Hip Abduction Adduction at Pool Wall  - 1 x daily - 7 x weekly - 3 sets - 10 reps - Standing March at [facility]  - 1 x daily - 7 x weekly - 3 sets - 10 reps - Standing Hip Flexion Extension at [employer]  - 1 x daily - 7 x weekly - 3 sets - 10 reps - Standing Hip Hinge  - 1 x daily - 7 x weekly - 3 sets - 10 reps - Sit to Stand  - 1 x daily - 7 x weekly - 3 sets - 10 reps  *not issued. ASSESSMENT:  CLINICAL IMPRESSION: PN: Pt making some progress but slow.  She has met her Foto goal and improved nicely in knee strength but only slightly in hip strength. Although scoring fairly high on Berg balance initially she has only progressed by 1 point.  She feels her main deficit is in her balance and wishes to improve further.  She will continue to  benefit from skilled aquatic therapy intervention with focus on  hip strength and balance.  Final aquatic HEP in process of being created.  She intends to gain membership at [facility] in the next few weeks to have access to a pool. Plan for 2-4 more visits.    Initial clinical assessment Patient is a 82 y.o. f who was seen today for physical therapy evaluation and treatment for general weakness.  She has just completed land based PT for knee OA as well as general weakness at [facility].  This order received from pulmonology stating she is interested in "water aerobic therapy". Pt instructed and VU of aquatic-PT is intended to facilitate independence with self-directed HEP and that water aerobics is very different from skilled aquatic physical therapy. She will benefit from a short duration of skilled Physical therapy intervention to instruct pt on a strengthening and balance program. Will create an HEP for pt to  continue indep. She is considering membership at a pool to be able to have access.  OBJECTIVE IMPAIRMENTS: decreased balance and decreased strength.   ACTIVITY LIMITATIONS: locomotion level  PARTICIPATION LIMITATIONS: community activity and playing tennis  PERSONAL FACTORS: Age and 1-2 comorbidities: OA and trigeminal neuralgia  are also affecting patient's functional outcome.   REHAB POTENTIAL: Good  CLINICAL DECISION MAKING: Evolving/moderate complexity  EVALUATION COMPLEXITY: Moderate   GOALS: Goals reviewed with patient? Yes  SHORT TERM GOALS = LONG TERM GOALS: Target date: 12/04/22 Pt will tolerate full aquatic sessions consistently without increase in pain and with improving function to demonstrate good toleration and effectiveness of intervention.   Baseline:TBA Goal status: Met 12/08/22  2.  Pt will be indep with final aquatic HEP for continued management of condition  Baseline: TBA Goal status: INITIAL  3.  Pt will gain access to pool in order to complete aquatic HEP Baseline: no access Goal status:In progress - 12/04/22  4.   Pt will tolerate exercising in pool without knee pain limitation Baseline: pain land based Goal status: MET - 12/04/22  5.  Pt will improve berg balance score to >54/56 to demonstrate carryover from balance training in pool to land Baseline: 51/56 Goal status:   6.  Pt to meet stated Foto Goal of 48% Baseline: 41% Goal status: MET -12/04/22  LONG TERM GOALS: Not necessary at this time   PLAN:  PT FREQUENCY: 1-2x/week  PT DURATION: 6 weeks 5 visits  PLANNED INTERVENTIONS: Therapeutic exercises, Therapeutic activity, Neuromuscular re-education, Balance training, Gait training, Patient/Family education, Self Care, Joint mobilization, Stair training, Orthotic/Fit training, DME instructions, Aquatic Therapy, Dry Needling, Electrical stimulation, Moist heat, Taping, Ionotophoresis 4mg /ml Dexamethasone, Manual therapy, and Re-evaluation  PLAN FOR NEXT SESSION: Aquatics: LE and core strengthening, balance training. Aquatic HEP   Corrie Dandy [place]) Elleana Stillson MPT 12/08/22 12:09 PM [facility] [address] [place], [place], [address] Phone: [phone]   Fax:  [phone]

## 2022-12-11 DIAGNOSIS — H35372 Puckering of macula, left eye: Secondary | ICD-10-CM | POA: Diagnosis not present

## 2022-12-11 DIAGNOSIS — H353123 Nonexudative age-related macular degeneration, left eye, advanced atrophic without subfoveal involvement: Secondary | ICD-10-CM | POA: Diagnosis not present

## 2022-12-11 DIAGNOSIS — H353112 Nonexudative age-related macular degeneration, right eye, intermediate dry stage: Secondary | ICD-10-CM | POA: Diagnosis not present

## 2022-12-11 DIAGNOSIS — H43813 Vitreous degeneration, bilateral: Secondary | ICD-10-CM | POA: Diagnosis not present

## 2022-12-11 DIAGNOSIS — H35033 Hypertensive retinopathy, bilateral: Secondary | ICD-10-CM | POA: Diagnosis not present

## 2022-12-15 ENCOUNTER — Ambulatory Visit (INDEPENDENT_AMBULATORY_CARE_PROVIDER_SITE_OTHER): Payer: Medicare Other | Admitting: Sports Medicine

## 2022-12-15 VITALS — BP 122/78 | Ht 62.5 in | Wt 138.0 lb

## 2022-12-15 DIAGNOSIS — M25562 Pain in left knee: Secondary | ICD-10-CM | POA: Diagnosis not present

## 2022-12-15 DIAGNOSIS — M25561 Pain in right knee: Secondary | ICD-10-CM

## 2022-12-15 DIAGNOSIS — M25551 Pain in right hip: Secondary | ICD-10-CM

## 2022-12-15 DIAGNOSIS — G8929 Other chronic pain: Secondary | ICD-10-CM | POA: Diagnosis not present

## 2022-12-15 DIAGNOSIS — M25552 Pain in left hip: Secondary | ICD-10-CM

## 2022-12-15 MED ORDER — PREDNISONE 2 MG PO TBEC: 2.0000 mg | DELAYED_RELEASE_TABLET | Freq: Every day | ORAL | 0 refills | Status: DC

## 2022-12-15 MED ORDER — PREDNISONE 1 MG PO TABS: 2.0000 mg | ORAL_TABLET | Freq: Every day | ORAL | 0 refills | Status: AC

## 2022-12-16 NOTE — Progress Notes (Signed)
Patient ID: NEELAH MANNINGS, female   DOB: 1941-05-19, 82 y.o.   MRN: [id number]  [name] presents today with pain in both hips, both knees, and sciatica in the right leg.  These are familiar issues for her.  She is established with rheumatology,Dr. Pilar Jarvis, and tells me that she has a follow-up appointment with their office in a couple of weeks.  She has been on low doses of prednisone in the past and has done very well with this.  She had to discontinue her prednisone prior to surgery earlier this year.  She has been doing fairly well, especially with physical therapy but recently developed returning pain in multiple joints including right leg sciatica.  Physical exam was not repeated today.  We simply talked about treatment options including low-dose prednisone and repeat lumbar ESI.  Given her multiple locations of pain and her improvement in the past on prednisone, I have agreed to provide her with a prescription for 2 mg of prednisone daily until she can see her rheumatologist in a couple of weeks.  If her sciatica continues despite starting oral prednisone, she will contact the office and we will order a repeat lumbar ESI for right leg radiculopathy.  Most recent MRI of her lumbar spine was in 2023 so we should not need updated imaging prior to that injection.  Follow-up as needed.  This note was dictated using Dragon naturally speaking software and may contain errors in syntax, spelling, or content which have not been identified prior to signing this note.

## 2022-12-17 ENCOUNTER — Other Ambulatory Visit: Payer: Self-pay

## 2022-12-17 DIAGNOSIS — M5416 Radiculopathy, lumbar region: Secondary | ICD-10-CM

## 2022-12-18 ENCOUNTER — Encounter: Payer: Self-pay | Admitting: Sports Medicine

## 2022-12-18 DIAGNOSIS — M5416 Radiculopathy, lumbar region: Secondary | ICD-10-CM

## 2022-12-18 NOTE — Addendum Note (Signed)
Addended by: Rutha Bouchard E on: 12/18/2022 09:30 AM   Modules accepted: Orders

## 2022-12-23 ENCOUNTER — Other Ambulatory Visit: Payer: Self-pay | Admitting: Sports Medicine

## 2022-12-23 ENCOUNTER — Ambulatory Visit: Payer: Medicare Other | Admitting: Sports Medicine

## 2022-12-23 DIAGNOSIS — M5416 Radiculopathy, lumbar region: Secondary | ICD-10-CM

## 2022-12-28 ENCOUNTER — Encounter
Payer: Medicare Other | Attending: Physical Medicine and Rehabilitation | Admitting: Physical Medicine and Rehabilitation

## 2022-12-28 VITALS — BP 120/71 | HR 65 | Ht 62.5 in | Wt 139.0 lb

## 2022-12-28 DIAGNOSIS — M353 Polymyalgia rheumatica: Secondary | ICD-10-CM | POA: Diagnosis not present

## 2022-12-28 DIAGNOSIS — G500 Trigeminal neuralgia: Secondary | ICD-10-CM | POA: Insufficient documentation

## 2022-12-28 DIAGNOSIS — M5430 Sciatica, unspecified side: Secondary | ICD-10-CM | POA: Insufficient documentation

## 2022-12-28 NOTE — Addendum Note (Signed)
Addended by: Becky Sax on: 12/28/2022 03:35 PM   Modules accepted: Orders

## 2022-12-28 NOTE — Progress Notes (Signed)
Subjective:    Patient ID: [name], female    DOB: June 17, 1941, 82 y.o.   MRN: [id number]  HPI  [name] presents today for f/u of severe trigeminal neuralgia.   1) Severe trigeminal neuralgia: -today pain is 7/10 -Saw Dr. Gloriann Loan 8 weeks ago and the new programs worked but then it stops working when she returns home -she is seeing neurosurgeon Dr. Lenord Fellers on Thursday  [name] researched red light therapy -she went to see the doctor in [place] and was only been able to spend 5 minutes with her -they did about 15 minutes of adjustments and the new programs seemed to hurt more than they help -has had stimulator turned off since last night -was able to enjoy her birthday -she did well for 3 weeks but recently pain has been around 8-10 -when pain is very severe it makes her eye hurt -the way her stimulator is set it makes her nose hurt She is going back to [facility] on [name] 10th -she is managing with the gabapentin and had to take 2 extra yesterday and it did not make her sleepier  -pain fluctuates  -she went to [place] last week with her children.  -she keeps a journal of when she changes the program and what her pain level is in her teeth, eyes, and cheek. She put in not to use the teeth program -she said if they could hit the nerve between the two teeth that would be helpful -they have her an 11 and 12 in that region,  -she cannot get the right level of stimulation. Before she left she decreased it by 50%. Now she is on a cheat program and the lowest amount. It is helping the teeth but hurting the eye.  -Dr. Gloriann Loan had a baby on 11/14 and she is not coming back until January.  -she talked to the representative -last night she turned off the stimulator -she slept almost day today and did not want to wake up for supper which was rare for her -she is having a difficult time weaning off the hydrocodone.  -she has been off of it in two days -she is expecting a return call from Dr. Paulo Fruit  team soon to discuss wean off fentanyl -drove up on Tuesday and on Wednesday was supposed to have an appointment with pain psychologist. When they got there they were told that appointment had been changed to virtual. The next day they had a more sophisticated MRI. [name] did moving in MRI so MRI was not as helpful. The next day they saw Dr. Gloriann Loan and discussed several procedural options and then went back to the motor stimulator- that is scheduled on October 16th.  -she recently went on vacation and had to spend the majority of the vacation in bed due to the severity of her pain -she stopped using the intranasal ketamine because although it helped initially, it appeared to make her pain worse later -she asks if she can wean off some medication as she had a recent appointment with Dr. Gloriann Loan and was told she may have a lot of postoperative pain if she is taking so much pain medication. She would still like to do this and also experiences sedation from her medication. She asks how she would go about weaning her medications -she asks if she should wean off the gabapentin since this makes her so sleepy She asks if she should restart Topamax. -she was encouraged by her phone visit with Dr. Gloriann Loan and plans  to pursue surgery. She is going to [facility] next week for Brain MRI, neuropsych eval, and to discuss the risks and benefits of the procedure with Dr. Gloriann Loan -pain was so severe in the past that she expressed suicidality to her husband [name].  -she feels that none of her medications are helpful but she knows that when she tries to wean them her pain worsens.  -she asks about refill of the compounding cream -her husband asks about referral to ketamine clinic for depression rather than pain -she went back to the dentist because it was urting between two teeth and he said there is nothing wrong with those teeth and it is the trigeminal nerve When she took a shower that usually seems to help with but was migrating  around her face.  -appointment with neurology in July -Dr. Gloriann Loan cancelled the zoom call in [name] but this was rescheduled to July.  -she finds that only the gabapentin helps -she is not sure if the fentanyl helps When she is feeling really bad she tries to take another one but she gets very sleepy and dizzy.  -she is taking 8 a day of 100mg  per day- she takes two gabapentin and tylenol and hydrocodone in the morning. She takes another two at breakfast around 11, another 2 at lunch around 1:30pm.  -she had a recent root canal and pain has become much more severe after that -her pain is in the two teeth next to where she had the root canal -Oragel heps for a bout an hour.  -she tried increasing her Gabapentin to 300mg  TID but she could not tolerate this. It made her too sleepy.  -she contacted Dr. Lorrine Kin and he recommended she pause wean of Fentanyl patch and I agree with this -she took last patch last night. She still has 12.25mcg patch available -her husband asks whether she should take a higher dose of gabapentin -the fentanyl patch makes her very sleepy and so allows her to sleep. It has also made her dizzier.  -the fentanyl patch did not provide more relief- she would like to wean off. Weaned to 25, then 12.49mcg, then off this week -referral was to [facility] and not to Dr. Hector Shade she states and they need a new referral send to Dr. Clint Lipps -she is using the compound cream and this helps- she uses this twice per day -capsaicin made it burn  -lamotrigine makes her too sleepy and dizzy to come down the stairs by herself and she does not feel comfortable going to the shower -left eye more open than usual. -she does not feel she is getting relief from the fentanyl patch -she feels that the procedure at [facility] is a last resort.  -Given darkening of skin and worsening eye lid closure, MRI brain stat obtained and shows new cyst that could be post-surgical. Recommended she  go to ED given worsening dizziness and facial swelling as well. ED physician discussed case with NSGY and neurology and discharged home recommending follow-up with Dr. Belva Bertin and Dr. Molli Hazard. She was given IV Fentanyl in the ED which helped and discharged on Fentanyl patch, which has not helped.  -discussed increasing Fentanyl patch to since she has not experienced any negative side effects, no respiratory depression, and her husband is agreeable. I have sent in new script to start Saturday, but insurance required prior auth, which was approved.  -tonight would be the third tay of the Fentanyl patch -she is still taking  the Gabapentin -she feels that when she lays down she is spinning.  -eye continues to appear more closed.  -Husband has called Dr. Kathlene Cote and Dr. Barb Merino office to scheduled follow-up appointments and has appointments with both tomorrow.  -Will run out of Fentanyl patch by Sunday -pain is worsening and nothing is providing relief -she asks whether she should go to ED -she has also noted dizziness and is not sure if this is from being in the MRI or from the [place].  -morphine was not effective and caused itching, sarna lotion did help wit this.  -hydrocodone has been most effective thus far -nucynta was not effective but she is not sure whether she gave it a fair trial -she has month appointments scheduled with Riley Lam and f/u with me in Feb -she used the Norco last night and this morning with good benefit and has started to feel the pain return this afternoon -she did find benefit from the compound cream- she continues to use this.  -She continues to have a sharp shooting pain doing around her eyelid and the tip teeth and in the top of her head.  -has a radiofrequency ablation treatment that helped but not completely -she is not having as much sharp knife-like pin like in her eye Her top teeth in her left law have been hurting really badly.  -she now  takes the baclofen with the gabapentin and they seem to work well together.  -she had surgical procedure and feels worse -yesterday she took a half a tablet but this morning she took a whole one. She does fine with the hydrocodone but a whole one makes her too sleepy.  -she is taking 2 100mg  Gabapentin three times per day but it makes her dizzy and sleepy so she cannot funciton -the cold worsens her pain.  -The IV fentanyl did help with the pain.  -her husband feels she should get one more dose of the 25mg  fentanyl. -she did get one week benefit from the steroid injection.  -she is doing terribly -she is miserable -she feels depressed due to the pain -she has considered taking the whole bottle of hydrocodone because because she can't take this pain anymore.  -she started taking the Gabapentin 300mg  TID.  -she does find relief from the hydrocodone -the pain is constant -she wonders if it is inflammation.  -she has not tried turmeric. Her son-in-law gave her a bottle and she couldn't stomach it.   2) Fibromyalgia: -She has been almost nonfunctional due to the severity of her pain. It continues to be very severe.  -She would like to wean off the Gabapentin as she feels at risk of falls -she is taking Topamax BID. She felt this was helpful but made her unsteady on her feet.  -still using gabapentin and this is helping.  -she feels so fatigued that she can only have a shower every other day.   3) Osteoporosis: -husband asks whether she should get a Prolia shot.   4) Dizziness: -head was swimming -she felt like she was going to fall.  -worsening -feels like she is spinning, not the room  -She tried the Phenytoin for 2 days and did not feel benefit so stopped it.   -she feels the gabapentin aggravates this and she would love to wean off but knows it helps her.   -She feels very sleepy during the day.   -She has never tried Topirmate before.   5) Sore throat -developed stopping  lamotrigine since not  helping.   Prior history:  Her pain continues to be in her left eye, forehead. She has not tried any topicals for pain. She uses capsaicin for her back and knees.   Since last visit nothing we have tried has helped with her pain. She has tried to decrease her Gabapentin dose but then her pain increased so she has resumed taking 4 Gabapentin per day.   Her husband asks whether he should contact her surgeon regarding whether nerve may have become unclamped.   Discussed prolotherapy, trigger point injections, Baclofen, Clonazepam, Phenytoin as other options we could try,=.  Pain continues to be excruciating and she states she is not sure how much longer she can live like this.   Prior history: She has having some nausea, lightheadedness and felt this may be related to her Gabapentin. She did not take any yesterday and then felt more sever pain in the evening. She also gets electrical shooting across her jaw.   She does not feel that she has cluster headaches as the pain has been so constant. She feels that she is getting worse. The oxygen made her feel relaxed but it is not helping.   The Sarna lotion does help with the itching in her head.   She was willing to try the Amitriptyline but it makes her too sleepy in the morning.   Her teeth and left eye incredibly hurt.   She feels that the Gabapentin has been making her dizziness.   She asks about biofeedback.   The pain is 7/10 on average, similar to last time.   When the pain is severe it can make her depressed.    6) Macular degeneration:  Has been following with opthalmology  Pain Inventory Average pain 10 Pain Right Now 7 My pain is constant, sharp, burning, dull, stabbing, and aching  In the last 24 hours, has pain interfered with the following? General activity 8 Relation with others 1 Enjoyment of life 10  What TIME of day is your pain at its worst? morning  and daytime Sleep (in general)  Good  Pain is worst with  bending & standing  Pain improves with rest, heat, medication  Relief with medication is 3   Family History  Problem Relation Age of Onset   Heart disease Mother    Heart disease Father    Stroke Father    Stroke Sister    Asthma Daughter    Colon cancer Neg Hx    Social History   Socioeconomic History   Marital status: Married    Spouse name: Not on file   Number of children: 2   Years of education: college   Highest education level: Not on file  Occupational History   Occupation: housewife    Employer: RETIRED  Tobacco Use   Smoking status: Never   Smokeless tobacco: Never  Vaping Use   Vaping Use: Never used  Substance and Sexual Activity   Alcohol use: No    Alcohol/week: 0.0 standard drinks of alcohol   Drug use: No   Sexual activity: Not on file  Other Topics Concern   Not on file  Social History Narrative   1 cup of Jasmine tea daily    Social Determinants of Health   Financial Resource Strain: Not on file  Food Insecurity: Not on file  Transportation Needs: Not on file  Physical Activity: Not on file  Stress: Not on file  Social Connections: Not on file   Past Surgical History:  Procedure Laterality Date   CHOLECYSTECTOMY  03/2010   COLONOSCOPY  2009   Dr. Harvie Junior    CYSTOSCOPY     Multiple Cystoscopies with stents or biopsy    ESOPHAGOGASTRODUODENOSCOPY  10/2013   EYE SURGERY     bilateral cataract removal   Gamma knife radiation  01/2018   for trigeminal neuralgia   REFRACTIVE SURGERY Bilateral 2022   RHIZOTOMY Left 04/28/2021   Procedure: Left Percutaneous trigeminal balloon rhizotomy;  Surgeon: Jadene Pierini, MD;  Location: [facility] OR;  Service: Neurosurgery;  Laterality: Left;   TONSILLECTOMY     Past Surgical History:  Procedure Laterality Date   CHOLECYSTECTOMY  03/2010   COLONOSCOPY  2009   Dr. Harvie Junior    CYSTOSCOPY     Multiple Cystoscopies with stents or biopsy     ESOPHAGOGASTRODUODENOSCOPY  10/2013   EYE SURGERY     bilateral cataract removal   Gamma knife radiation  01/2018   for trigeminal neuralgia   REFRACTIVE SURGERY Bilateral 2022   RHIZOTOMY Left 04/28/2021   Procedure: Left Percutaneous trigeminal balloon rhizotomy;  Surgeon: Jadene Pierini, MD;  Location: [facility] OR;  Service: Neurosurgery;  Laterality: Left;   TONSILLECTOMY     Past Medical History:  Diagnosis Date   Adenomatous colon polyp 03/24/2018   Allergy    Arthritis    Cataract    Chronic headaches    Depression    Fibromyalgia    Gallstones    GERD (gastroesophageal reflux disease)    Hyperlipemia    Hypertension    Interstitial cystitis    Lactose intolerance    LBBB (left bundle branch block)    Osteoarthritis    Osteopenia    Pneumonia    Thyroid disease    Tinnitus    Trigeminal neuralgia    There were no vitals taken for this visit.  Opioid Risk Score:   Fall Risk Score:  `1  Depression screen PHQ 2/9     10/27/2022    2:05 PM 09/15/2022    1:25 PM 07/21/2022    1:38 PM 05/19/2022    1:11 PM 02/03/2022    1:29 PM 12/25/2021    2:01 PM 12/24/2021    1:11 PM  Depression screen PHQ 2/9  Decreased Interest 0 0 1 0 0 0 1  Down, Depressed, Hopeless 0 0 1 0 0 3 1  PHQ - 2 Score 0 0 2 0 0 3 2  Altered sleeping      0   Tired, decreased energy      0   Change in appetite      0   Feeling bad or failure about yourself       0   Trouble concentrating      0   Moving slowly or fidgety/restless      0   Suicidal thoughts      0   PHQ-9 Score      3   Difficult doing work/chores      Somewhat difficult     Review of Systems  Constitutional: Negative.   HENT: Negative.    Eyes:  Positive for visual disturbance.  Respiratory: Negative.    Cardiovascular: Negative.   Gastrointestinal: Negative.   Endocrine: Negative.   Genitourinary: Negative.   Musculoskeletal: Negative.        Pain in left side of face  Skin: Negative.   Allergic/Immunologic:  Negative.   Neurological:  Positive for dizziness, tremors, light-headedness and numbness.  Tingling   Hematological: Negative.   Psychiatric/Behavioral: Negative.    All other systems reviewed and are negative.      Objective:   Physical Exam Gen: no distress, normal appearing, BP 120/71. Weight 139lbs, BMI 25.02 Gen: no distress, normal appearing HEENT: oral mucosa pink and moist, NCAT Cardio: Reg rate Chest: normal effort, normal rate of breathing Abd: soft, non-distended Ext: no edema Psych: pleasant, normal affect Skin: intact Neuro: Alert and oriented x3 . Assessment & Plan:  Mrs. Barham is an 82 year old woman who presents for f/u of left sided trigeminal neuralgia and dizziness.    1) Sensory deafferation pain, left sided trigeminal neuralgia vs cluster headaches (she does have lacrimation and runny nose on that side). -discussed her recent visit with Dr. Gloriann Loan -discussed her follow-up with Dr. Lenord Fellers -discussed the negative experience when she last went to [place] -discussed that next available appointment was on June 24th or 26th, but they were able to move this up to May 22nd or 24th -discussed that she keeps a journal with the setting with the level of her pain.  -Continue Diclofenac 3%, Gabapentin 5%, Lidocaine 5%, Menthol 1% compounded at [facility]:  apply 1-2 pumps three to four times per day as needed  -discussed response to her stimulator -discussed doubling the gabapentin when pain is severe Increase low dose naltrexone to 3mg  HS -discussed her response to the stimulator She does have a history of migraines.  -Discussed that if she has to be in bed all day due to her pain it may be beneficial for her husband to give her at least one hydrocodone per day to enable her to function -provided refill of Fentanyl patch as she will be leaving for her surgery in [place] soon and her husband plans to stay there for some time in case she has any surgical  complications.  -reviewed Dr. Paulo Fruit note with her, discussed the plan for her visit to [facility] in May -ordered MRI Brain with and without contrast, discussed the plan for repeat MRI brain in May -discussed her follow-up with pain psychology.  -recommended weaning off Fentanyl, discussed stopping fentanyl because she is on 12.5mg  today but she had tried this a couple of weeks ago and pain was so severe that she needed patch to be replaced.  -discussed that she is taking hydrocodone 4 tablets per day, and recommend to decrease this to 3 tabs per day for three days and then to keep decreasing the tab by once per day.  -discussed that skipping the morning dose of hydrocodone will at least allow her to sleep -recommended trying Frankincense and Myrrh essential oils mixed in coconut oil and applied to area of pain -recommended restarting Topamax -called [facility] to prescribe compounding gel refill -continue Gabapentin 100mg  up to 8 times a day, discussed her current schedule of takin -continue Fentanyl patch to and she is agreeable. Discussed that phenytoin, carbamazepine -discussed using Oragel multiple times per ay since this helps for an hour, use the topical cream intermittently in between -continue follow-up with [facility] to set up an appointment for her with them. Unfortunately Dr. Clint Lipps has retired but was able to set her up with Dr. Myrla Halsted who specializes in migraines and trigeminal neuralgia on July 25th. Let patient and husband know. They will mail her their address.  -restart 12. fentanyl patch as patient does not feel she can get through weekend with this level of pain -commended her for calling Dr. Lorrine Kin  as well to let him know how she is feeling -recommended taking 1 hydrocodone now for the severe pain, and discussed that the Fentanyl patch with take 12-24 hours to kick in -discussed with [name] the neurology referral and she did specify Dr. Hector Shade and spoke with the [facility] Neurology department -discussed CBD oil, her son bought her some of this and she felt that it greatly helped  Recommend topical CBD oil- discussed its benefits in reducing inflammation, pain, insomnia, and anxiety, but she stopped because effect wore of.  -Discussed that CBD oil differs from marijuana in that it does not contain THC- the substance that causes euphoria.  -Discussed that it is made from the hemp plant.  -It has been used for thousands of years -preliminary research suggests that if may be able to shrink cancerous tumors, stop plaque formation in Alzheimer's Disease, and slow the progress of brain disease from concussions.  -Additional benefits that have been demonstrated in studies include improved nausea, indigestion, and brain health, and reduced seizures.  -In a survey 92% of patients who tried medical cannabis felt it improved symptoms such as chronic pain, arthritis, migraines, and cancer.   -Provided with a pain relief journal and discussed that it contains foods and lifestyle tips to naturally help to improve pain. Discussed that these lifestyle strategies are also very good for health unlike some medications which can have negative side effects. Discussed that the act of keeping a journal can be therapeutic and helpful to realize patterns what helps to trigger and alleviate pain.   -discussed with Dr. Marcia Brash trigeminal deafferation pain, discussed the difference between this and trigeminal neuralgia with patient and husband, discussed motor cortex stimulation. -discussed whether or not to continue fentanyl patch, husband does feel that the IV fentanyl  -encouraged following with Dr. Sweet/Dr. Hyacinth Meeker at [facility] to try procedure recommended by D. Ostegard.  -discussed mechanism of action of low dose naltrexone as an opioid receptor antagonist which stimulates your body's production of its own natural endogenous opioids, helping to decrease  pain. Discussed that it can also decrease T cell response and thus be helpful in decreasing inflammation, and symptoms of brain fog, fatigue, anxiety, depression, and allergies. Discussed that this medication needs to be compounded at a compounding pharmacy and can more expensive. Discussed that I usually start at 1mg  and if this is not providing enough relief then I titrate upward on a monthly basis.   -stop lamotrigine as it is making her dizzy without benefit -reviewed patient's medical notes -discussed her progress with getting a referral to [facility] -recommended going to ED given darkening of skin, worsening eye closure, finding of cyst, dizziness to receive prompt neurosurgical eval regarding if surgery is necessary, and also for pain management given worsening pain refractory to all treatments and resulting suicidality. Reviewed ED doc notes- he discussed with on-call neurology and NSGY and determined that patient could be discharged with outpatient follow-up. IV fentanyl was administered with relief to patient. She was discharged on fentanyl patch which has not provided relief. She is getting - no side effects- discussed increasing to and her husband is in agreement. Prior auth completed and approved. Discussed with husband starting patch on Satruday after she has worn patch for 72 hours -encouraged follow-up with Dr. Marcia Brash, husband let me know appointment has been set up 1/18.  -recommended follow-up with Dr. 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  -Discussed that turmeric  -discussed steroid taper. -continue savella, husband feels like it is helping with  her suicidal thoughts. Increase to 25mg .  Discontinued hydrocodone  -discuss mouth guard with dentist.  -d/c topamax -she does have another procedure scheduled. -Continue Norco 5mg  TID PRN -follow-up monthly with Riley Lam and with me in February, placed on waitlist for earlier appointment with me, advised phone visits/MyChart are a great way to communicate in the interim -discussed that she can take an additional one or two tylenol for breakthrough pain -recommended checking CMP for liver enzymes with next labs -prescribed NAC 600mg  BID to protect the liver while taking tylenol, discussed its health benefits for mitochondrial function -d/c Baclofen  -f/u with Dr. Marcia Brash and Dr. Lorrine Kin 1/18 -discussed retrying carbamazepine to see if it is effective for her. It is $89 for her, so we discussed trying Lamictal instead.  -encouraged anti-inflammatory diet.  -she had sinusitis that was viral and was treated with antibiotics. This was in the early 90s. The symptoms resolved after 2 months. She uses to get sinus infections and saline rinse.  -She has had multiple root canals.  -Discontinue 100% oxygen via facemask as this was not helpful.  -Discussed that unfortunately Sprint PNS is not indicated for craniofacial pain. -Amitriptyline and Lyrica did not help.  -Continue Cymbalta 60mg .  -Topiramate helped but caused dizziness.  -Provided with hand out with information regarding trigeminal neuralgia -Failed Penytoin.  -trial lidocaine patch -She would like to try biofeedback. -reviewed neurology note.  -referred to Dr. Hector Shade.  -will try trigger point injections next visit.  -Continue Ketamine 10%, Baclofen 2%, Cyclobenzaprine 2%, Ketoprofen 10%, Gabapentin 6%, Bupivacaine 1%, Amitryptiline 5% to apply to painful areas- using 3-4 times per day.  -Can consider Meloxicam if Phenytoin does not help.  -Discussed Qutenza as an option for neuropathic pain control. Discussed that this is a  capsaicin patch, stronger than capsaicin cream. Discussed that it is currently approved for diabetic peripheral neuropathy and post-herpetic neuralgia, but that it has also shown benefit in treating other forms of neuropathy. Provided patient with link to site to learn more about the patch: https://www.clark.biz/. Discussed that the patch would be placed in office and benefits usually last 3 months. Discussed that unintended exposure to capsaicin can cause severe irritation of eyes, mucous membranes, respiratory tract, and skin, but that Qutenza is a local treatment and does not have the systemic side effects of other nerve medications. Discussed that there may be pain, itching, erythema, and decreased sensory function associated with the application of Qutenza. Side effects usually subside within 1 week. A cold pack of analgesic medications can help with these side effects. Blood pressure can also be increased due to pain associated with administration of the patch.  -she cut down on her gabapentin due to her dizziness    Turmeric to reduce inflammation--can be used in cooking or taken as a supplement.  Benefits of turmeric:  -Highly anti-inflammatory  -Increases antioxidants  -Improves memory, attention, brain disease  -Lowers risk of heart disease  -May help prevent cancer  -Decreases pain  -Alleviates depression  -Delays aging and decreases risk of chronic disease  -Consume with black pepper to increase absorption    Turmeric Milk Recipe:  1 cup milk  1 tsp turmeric  1  tsp cinnamon  1 tsp grated ginger (optional)  Black pepper (boosts the anti-inflammatory properties of turmeric).  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  -she is a good metabolizer of amitriptyline and has tolerated nortrypilline in the past, but without much benefit.    3) CKD: encouraged 6-8 glasses of water per day. She states that she had AKI with Ibuprofen in the past. Discussed checking Cr level next visit if we are considering Meloxicam -Discussed that Nucynta, and other pain medications, may stay in her bloodstream longer given her CKD   4) Hypernatremia: encouraged hydration   5) General health and pain: provided with list of healthy foods that are both nutritious and fight pain.   6) Depressed: Amitriptyline did help with this but she could not tolerate the drowsiness. Encouraged focusing on the factors that she can control, such as anti-inflammatory diet.   7) Fibromyalgia: Discussed goal to wean steroids prior to surgery. Continue 1mg  daily. She is on that intermittently. She started this again about a month ago. She has been on this about a year. Continue Savella -discussed benefits of cold water showers.  -continue gabapentin.  -high dose vitamin D sent  8) Dizziness: -discussed that this could be from the combination of gabapentin and baclofen.  -discussed that all the medications we are trying for pain may worsen the dizziness  9) Constipation:  -Provided list of following foods that help with constipation and highlighted a few: 1) prunes- contain high amounts of fiber.  2) apples- has a form of dietary fiber called pectin that accelerates stool movement and increases beneficial gut bacteria 3) pears- in addition to fiber, also high in fructose and sorbitol which have laxative effect 4)  figs- contain an enzyme ficin which helps to speed colonic transit 5) kiwis- contain an enzyme actinidin that improves gut motility and reduces constipation 6) oranges- rich in pectin (like apples) 7) grapefruits- contain a flavanol naringenin which has a laxative effect 8) vegetables- rich in fiber and also great sources of folate, vitamin C, and K 9) artichoke- high in inulin, prebiotic great for the microbiome 10) chicory- increases stool frequency and softness (can be added to coffee) 11) rhubarb- laxative effect 12) sweet potato- high fiber 13) beans, peas, and lentils- contain both soluble and insoluble fiber 14) chia seeds- improves intestinal health and gut flora 15) flaxseeds- laxative effect 16) whole grain rye bread- high in fiber 17) oat bran- high in soluble and insoluble fiber 18) kefir- softens stools -recommended to try at least one of these foods every day.  -drink 6-8 glasses of water per day -walk regularly, especially after meals.   10) Osteopenia - was tapered off the prednisone -discussed that currently her pain may be more of a risk to her than osteopenia given her suicidal ideation from her pain -discussed risk of fall with the medications that make her dizziness.   11) Daytime sleepiness -discussed that this is secondary to her  medications.  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  -discussed red light therapy  15) HTN: -discussed clonidine patch could potentially help with HTN and pain.  -BP is 170/90 today.  -Advised checking BP daily at home and logging results to bring into follow-up appointment with PCP and myself. -Reviewed BP meds today.  -Advised regarding healthy foods that can help lower blood pressure and provided with a list: 1)  citrus foods- high in vitamins and minerals 2) salmon and other fatty fish - reduces inflammation and oxylipins 3) swiss chard (leafy green)- high level of nitrates 4) pumpkin seeds- one of the best natural sources of magnesium 5) Beans and lentils- high in fiber, magnesium, and potassium 6) Berries- high in flavonoids 7) Amaranth (whole grain, can be cooked similarly to rice and oats)- high in magnesium and fiber 8) Pistachios- even more effective at reducing BP than other nuts 9) Carrots- high in phenolic compounds that relax blood vessels and reduce inflammation 10) Celery- contain phthalides that relax tissues of arterial walls 11) Tomatoes- can also improve cholesterol and reduce risk of heart disease 12) Broccoli- good source of magnesium, calcium, and potassium 13) Greek yogurt: high in potassium and calcium 14) Herbs and spices: Celery seed, cilantro, saffron, lemongrass, black cumin, ginseng, cinnamon, cardamom, sweet basil, and ginger 15) Chia and flax seeds- also help to lower cholesterol and blood sugar 16) Beets- high levels of nitrates that relax blood vessels  17) spinach and bananas- high in potassium  -Provided lise of supplements that can help with hypertension:  1) magnesium: one high quality brand is Bioptemizers since it contains all 7 types of magnesium, otherwise over the counter magnesium gluconate 400mg  is a good option 2) B vitamins 3) vitamin D 4) potassium 5) CoQ10 6) L-arginine 7) Vitamin C 8) Beetroot -Educated that goal BP is 120/80. -Made goal to incorporate some of the above foods into diet.    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  -discussed that she had weaned off the steroids and had been doing fine off the steroids -discussed that aquatic therapy can help -discussed that she should encourage follow-up with rheumatology to assess for giant cell arteritis -[facility]  16.  Sciatica: -consider red light therapy -discussed aquatherapy

## 2022-12-29 DIAGNOSIS — H353123 Nonexudative age-related macular degeneration, left eye, advanced atrophic without subfoveal involvement: Secondary | ICD-10-CM | POA: Diagnosis not present

## 2022-12-30 ENCOUNTER — Ambulatory Visit (HOSPITAL_BASED_OUTPATIENT_CLINIC_OR_DEPARTMENT_OTHER): Payer: Medicare Other | Admitting: Physical Therapy

## 2022-12-31 ENCOUNTER — Ambulatory Visit (HOSPITAL_BASED_OUTPATIENT_CLINIC_OR_DEPARTMENT_OTHER): Payer: Self-pay | Admitting: Physical Therapy

## 2022-12-31 DIAGNOSIS — G509 Disorder of trigeminal nerve, unspecified: Secondary | ICD-10-CM | POA: Diagnosis not present

## 2023-01-01 ENCOUNTER — Other Ambulatory Visit: Payer: Self-pay | Admitting: Physical Medicine and Rehabilitation

## 2023-01-01 MED ORDER — DULOXETINE HCL 30 MG PO CPEP: 90.0000 mg | ORAL_CAPSULE | Freq: Every day | ORAL | 0 refills | Status: DC

## 2023-01-04 ENCOUNTER — Ambulatory Visit: Admission: RE | Admit: 2023-01-04 | Payer: Medicare Other | Source: Ambulatory Visit

## 2023-01-04 DIAGNOSIS — M5416 Radiculopathy, lumbar region: Secondary | ICD-10-CM | POA: Diagnosis not present

## 2023-01-04 MED ORDER — METHYLPREDNISOLONE ACETATE 40 MG/ML INJ SUSP (RADIOLOG
80.0000 mg | Freq: Once | INTRAMUSCULAR | Status: AC
  Administered 2023-01-04: 80 mg via EPIDURAL

## 2023-01-04 MED ORDER — IOPAMIDOL (ISOVUE-M 200) INJECTION 41%
1.0000 mL | Freq: Once | INTRAMUSCULAR | Status: AC
  Administered 2023-01-04: 1 mL via EPIDURAL

## 2023-01-04 NOTE — Discharge Instructions (Signed)

## 2023-01-07 ENCOUNTER — Ambulatory Visit (HOSPITAL_BASED_OUTPATIENT_CLINIC_OR_DEPARTMENT_OTHER): Payer: Medicare Other | Attending: Pulmonary Disease | Admitting: Physical Therapy

## 2023-01-07 DIAGNOSIS — M25561 Pain in right knee: Secondary | ICD-10-CM | POA: Diagnosis not present

## 2023-01-07 DIAGNOSIS — M6281 Muscle weakness (generalized): Secondary | ICD-10-CM | POA: Diagnosis not present

## 2023-01-07 DIAGNOSIS — M25562 Pain in left knee: Secondary | ICD-10-CM | POA: Insufficient documentation

## 2023-01-07 DIAGNOSIS — G8929 Other chronic pain: Secondary | ICD-10-CM | POA: Insufficient documentation

## 2023-01-07 DIAGNOSIS — R2681 Unsteadiness on feet: Secondary | ICD-10-CM | POA: Diagnosis not present

## 2023-01-07 NOTE — Therapy (Addendum)
OUTPATIENT PHYSICAL THERAPY LOWER EXTREMITY PROGRESS NOTE/Re-cert     Patient Name: EVONE WENNINGER MRN: [id number] DOB:1941-03-11, 82 y.o., female Today's Date: 01/07/2023  END OF SESSION:  PT End of Session - 01/07/23 1541     Visit Number 7    Number of Visits 12   Date for PT Re-Evaluation 02/18/23    PT Start Time 1532    PT Stop Time 1615    PT Time Calculation (min) 43 min    Activity Tolerance Patient tolerated treatment well    Behavior During Therapy [facility] for tasks assessed/performed               Past Medical History:  Diagnosis Date   Adenomatous colon polyp 03/24/2018   Allergy    Arthritis    Cataract    Chronic headaches    Depression    Fibromyalgia    Gallstones    GERD (gastroesophageal reflux disease)    Hyperlipemia    Hypertension    Interstitial cystitis    Lactose intolerance    LBBB (left bundle branch block)    Osteoarthritis    Osteopenia    Pneumonia    Thyroid disease    Tinnitus    Trigeminal neuralgia    Past Surgical History:  Procedure Laterality Date   CHOLECYSTECTOMY  03/2010   COLONOSCOPY  2009   Dr. Harvie Junior    CYSTOSCOPY     Multiple Cystoscopies with stents or biopsy    ESOPHAGOGASTRODUODENOSCOPY  10/2013   EYE SURGERY     bilateral cataract removal   Gamma knife radiation  01/2018   for trigeminal neuralgia   REFRACTIVE SURGERY Bilateral 2022   RHIZOTOMY Left 04/28/2021   Procedure: Left Percutaneous trigeminal balloon rhizotomy;  Surgeon: Jadene Pierini, MD;  Location: [facility] OR;  Service: Neurosurgery;  Laterality: Left;   TONSILLECTOMY     Patient Active Problem List   Diagnosis Date Noted   Decreased diffusion capacity 08/21/2018   Trigeminal neuralgia    Lumbar back pain with radiculopathy affecting right lower extremity 01/08/2014   Dizzinesses 01/08/2014    PCP: Merri Brunette MD  REFERRING PROVIDER: Luciano Cutter, MD   REFERRING DIAG: [phone] (ICD-10-CM) - Muscular deconditioning    THERAPY DIAG:  Muscle weakness (generalized)  Chronic pain of both knees  Unsteadiness on feet  Rationale for Evaluation and Treatment: Rehabilitation  ONSET DATE: >64yrs  SUBJECTIVE:   SUBJECTIVE STATEMENT: Pt reports she is a little stiff today.  Initial Subjective Have been doing therapy since Jan at [facility].  "I have stopped and may go back this summer if I need it. Have Oa both knees have had injections from Dr August Saucer. Trigeminal nerve pain x 4 years has had 2 gamma knife treatments and now has stimulator [facility]"  PERTINENT HISTORY: Trigeminal neuralgia chronic PAIN:  4/10 - in back and L knee    Pt has stimulator is able to change program to decrease pain  PRECAUTIONS: Fall  WEIGHT BEARING RESTRICTIONS: No  FALLS:  Has patient fallen in last 6 months? No  LIVING ENVIRONMENT: Lives with: lives with their family and lives with their spouse Lives in: House/apartment Stairs: Yes: Internal: 16 steps; on right going up Has following equipment at home: None  OCCUPATION: retired  PLOF: Independent  PATIENT GOALS: more exercise that will help my knee pain  NEXT MD VISIT: end of May  OBJECTIVE:   DIAGNOSTIC FINDINGS: X-Ray 10/23 Moderate tricompartmental degenerative changes  PATIENT SURVEYS:  FOTO Risk adjusted 41% with goal of 48% 12/04/22:  FOTO  51%  COGNITION: Overall cognitive status: Within functional limits for tasks assessed     SENSATION: WFL  EDEMA: none  MUSCLE LENGTH:   POSTURE: forward head  PALPATION: No tenderness  LOWER EXTREMITY ROM:  Active ROM Right eval Left eval Right 01/07/23  Hip flexion     Hip extension     Hip abduction     Hip adduction     Hip internal rotation     Hip external rotation     Knee flexion 114 120 130/ 125d  Knee extension 0 0   Ankle dorsiflexion     Ankle plantarflexion     Ankle inversion     Ankle eversion      (Blank rows = not tested)  LOWER  EXTREMITY MMT:  MMT Right eval Left eval Right/ Left 01/07/23  Hip flexion 4- 4- 4  Hip extension     Hip abduction 5 5   Hip adduction     Hip internal rotation     Hip external rotation     Knee flexion 4 4 5   Knee extension 5 5   Ankle dorsiflexion     Ankle plantarflexion     Ankle inversion     Ankle eversion      (Blank rows = not tested)    FUNCTIONAL TESTS:  5 times sit to stand: 14.11 Berg Balance Scale: 51/56  01/07/23 5 x STS:15 Berg:52/56  GAIT: Distance walked: 500 Assistive device utilized: None Level of assistance: Complete Independence Comments: Pt wearing patellar support right and full brace left knee. Cadence slightly slowed otherwise normal   TODAY'S TREATMENT:                                                                                                                             Pt seen for aquatic therapy today.  Treatment took place in water 3.5-4.75 ft in depth at the [facility] pool. Temp of water was 91.  Pt entered/exited the pool via stairs and step to pattern with hand rail.  Exercises -walking forward, back and side stepping 4.0 ft x 3 widths ea -decompression 4.6 ft using yellow noodle post wrapped across chest    -cycling x 3 mins; hip add/abd x 3 mins; skiing x 3 mins; return to cycling x - Solid Noodle press wide stance then staggered x 10ea - Hand Buoy Carry rainbow HB backward then forward marching x 2 widths   Pt requires the buoyancy and hydrostatic pressure of water for support, and to offload joints by unweighting joint load by at least 50 % in navel deep water and by at least 75-80% in chest to neck deep water.  Viscosity of the water is needed for resistance of strengthening. Water current perturbations provides challenge to standing balance requiring increased core activation.     PATIENT EDUCATION:  Education details: aquatic  therapy exercise progressions/modifications Person educated: Patient and  Spouse Education method: Explanation Education comprehension: verbalized understanding  HOME EXERCISE PROGRAM: Has active program from [employer]  Aquatics Access Code: [email or web address] URL: [email or web address] Date: 12/08/2022 Prepared by: Geni Bers This aquatic home exercise program from [employer] utilizes pictures from land based exercises, but has been adapted prior to lamination and issuance.    Exercises - Hand Buoy Carry  - 1 x daily - 7 x weekly - 3 sets - 10 reps - Side lunge with hand buoys  - 1 x daily - 7 x weekly - 3 sets - 10 reps - Braided Sidestepping with Arms Out  - 1 x daily - 7 x weekly - 3 sets - 10 reps - Noodle press  - 1 x daily - 7 x weekly - 3 sets - 10 reps - Standing 'L' Stretch at [employer]  - 1 x daily - 7 x weekly - 3 sets - 10 reps - Heel Toe Raises at Pool Wall  - 1 x daily - 7 x weekly - 3 sets - 10 reps - Standing Hip Abduction Adduction at Pool Wall  - 1 x daily - 7 x weekly - 3 sets - 10 reps - Standing March at [facility]  - 1 x daily - 7 x weekly - 3 sets - 10 reps - Standing Hip Flexion Extension at [employer]  - 1 x daily - 7 x weekly - 3 sets - 10 reps - Standing Hip Hinge  - 1 x daily - 7 x weekly - 3 sets - 10 reps - Sit to Stand  - 1 x daily - 7 x weekly - 3 sets - 10 reps  *not issued. ASSESSMENT:  CLINICAL IMPRESSION: Pt with 1 month gap in treatments due to trigeminal dysfunction.  She reports she received a steroid injection right hip 3 days ago for sciatic nerve flare which has subsided some.  Focused today on pain relief with movement patterns unloaded and stretching. She is able to find center of buoyancy well allowing for full range of movement in hips and knees for decompression and gentle movement in hip and knees unloaded.  PN: Pt making some progress but slow.  She has met her Foto goal and improved nicely in knee strength but only slightly in hip strength. Although scoring fairly high on Berg balance initially she  has only progressed by 1 point.  She feels her main deficit is in her balance and wishes to improve further.  She will continue to  benefit from skilled aquatic therapy intervention with focus on hip strength and balance.  Final aquatic HEP in process of being created.  She intends to gain membership at [facility] in the next few weeks to have access to a pool. Plan for 2-4 more visits.    Initial clinical assessment Patient is a 82 y.o. f who was seen today for physical therapy evaluation and treatment for general weakness.  She has just completed land based PT for knee OA as well as general weakness at [facility].  This order received from pulmonology stating she is interested in "water aerobic therapy". Pt instructed and VU of aquatic-PT is intended to facilitate independence with self-directed HEP and that water aerobics is very different from skilled aquatic physical therapy. She will benefit from a short duration of skilled Physical therapy intervention to instruct pt on a strengthening and balance program. Will create an HEP for pt to continue indep. She is  considering membership at a pool to be able to have access.  OBJECTIVE IMPAIRMENTS: decreased balance and decreased strength.   ACTIVITY LIMITATIONS: locomotion level  PARTICIPATION LIMITATIONS: community activity and playing tennis  PERSONAL FACTORS: Age and 1-2 comorbidities: OA and trigeminal neuralgia  are also affecting patient's functional outcome.   REHAB POTENTIAL: Good  CLINICAL DECISION MAKING: Evolving/moderate complexity  EVALUATION COMPLEXITY: Moderate   GOALS: Goals reviewed with patient? Yes  SHORT TERM GOALS = LONG TERM GOALS: Target date: 12/04/22 Pt will tolerate full aquatic sessions consistently without increase in pain and with improving function to demonstrate good toleration and effectiveness of intervention.   Baseline:TBA Goal status: Met 01/07/23  2.  Pt will be indep with final aquatic HEP for  continued management of condition  Baseline: TBA Goal status: INITIAL  3.  Pt will gain access to pool in order to complete aquatic HEP Baseline: no access Goal status:In progress - 12/04/22/ In progress 01/07/23  4.  Pt will tolerate exercising in pool without knee pain limitation Baseline: pain land based Goal status: MET - 12/04/22  5.  Pt will improve berg balance score to >54/56 to demonstrate carryover from balance training in pool to land Baseline: 51/56 Goal status: In Progress 01/07/23  6.  Pt to meet stated Foto Goal of 48% Baseline: 41% Goal status: MET -12/04/22  LONG TERM GOALS: Not necessary at this time   PLAN:  PT FREQUENCY: 1xweek  PT DURATION: 6 weeks   PLANNED INTERVENTIONS: Therapeutic exercises, Therapeutic activity, Neuromuscular re-education, Balance training, Gait training, Patient/Family education, Self Care, Joint mobilization, Stair training, Orthotic/Fit training, DME instructions, Aquatic Therapy, Dry Needling, Electrical stimulation, Moist heat, Taping, Ionotophoresis 4mg /ml Dexamethasone, Manual therapy, and Re-evaluation  PLAN FOR NEXT SESSION: Aquatics: LE and core strengthening, balance training. Aquatic HEP   Corrie Dandy [place]) Rahmon Heigl MPT 01/07/23 6:07 PM [facility] [address] [place], [place], [address] Phone: [phone]   Fax:  [phone]  Addend Rushie Chestnut) Shahiem Bedwell MPT 01/14/23 139p  Addend (orders) Corrie Dandy Tomma Lightning) Keyion Knack MPT 02/09/23

## 2023-01-11 DIAGNOSIS — M25559 Pain in unspecified hip: Secondary | ICD-10-CM | POA: Diagnosis not present

## 2023-01-11 DIAGNOSIS — M25562 Pain in left knee: Secondary | ICD-10-CM | POA: Diagnosis not present

## 2023-01-11 DIAGNOSIS — M150 Primary generalized (osteo)arthritis: Secondary | ICD-10-CM | POA: Diagnosis not present

## 2023-01-11 DIAGNOSIS — M170 Bilateral primary osteoarthritis of knee: Secondary | ICD-10-CM | POA: Diagnosis not present

## 2023-01-11 DIAGNOSIS — M797 Fibromyalgia: Secondary | ICD-10-CM | POA: Diagnosis not present

## 2023-01-11 DIAGNOSIS — M810 Age-related osteoporosis without current pathological fracture: Secondary | ICD-10-CM | POA: Diagnosis not present

## 2023-01-11 DIAGNOSIS — M353 Polymyalgia rheumatica: Secondary | ICD-10-CM | POA: Diagnosis not present

## 2023-01-14 ENCOUNTER — Encounter (HOSPITAL_BASED_OUTPATIENT_CLINIC_OR_DEPARTMENT_OTHER): Payer: Self-pay | Admitting: Physical Therapy

## 2023-01-14 ENCOUNTER — Ambulatory Visit (HOSPITAL_BASED_OUTPATIENT_CLINIC_OR_DEPARTMENT_OTHER): Payer: Medicare Other | Admitting: Physical Therapy

## 2023-01-14 DIAGNOSIS — M6281 Muscle weakness (generalized): Secondary | ICD-10-CM

## 2023-01-14 DIAGNOSIS — M25561 Pain in right knee: Secondary | ICD-10-CM | POA: Diagnosis not present

## 2023-01-14 DIAGNOSIS — M25562 Pain in left knee: Secondary | ICD-10-CM | POA: Diagnosis not present

## 2023-01-14 DIAGNOSIS — G8929 Other chronic pain: Secondary | ICD-10-CM | POA: Diagnosis not present

## 2023-01-14 DIAGNOSIS — R2681 Unsteadiness on feet: Secondary | ICD-10-CM

## 2023-01-14 NOTE — Therapy (Signed)
OUTPATIENT PHYSICAL THERAPY LOWER EXTREMITY TREATMENT     Patient Name: [name] MRN: [id number] DOB:1941-01-31, 82 y.o., female Today's Date: 01/14/2023  END OF SESSION:  PT End of Session - 01/14/23 1341     Visit Number 8    Number of Visits 12    Date for PT Re-Evaluation 02/19/23    PT Start Time 1117    PT Stop Time 1200    PT Time Calculation (min) 43 min    Activity Tolerance Patient tolerated treatment well    Behavior During Therapy [facility] for tasks assessed/performed               Past Medical History:  Diagnosis Date   Adenomatous colon polyp 03/24/2018   Allergy    Arthritis    Cataract    Chronic headaches    Depression    Fibromyalgia    Gallstones    GERD (gastroesophageal reflux disease)    Hyperlipemia    Hypertension    Interstitial cystitis    Lactose intolerance    LBBB (left bundle branch block)    Osteoarthritis    Osteopenia    Pneumonia    Thyroid disease    Tinnitus    Trigeminal neuralgia    Past Surgical History:  Procedure Laterality Date   CHOLECYSTECTOMY  03/2010   COLONOSCOPY  2009   Dr. Harvie Junior    CYSTOSCOPY     Multiple Cystoscopies with stents or biopsy    ESOPHAGOGASTRODUODENOSCOPY  10/2013   EYE SURGERY     bilateral cataract removal   Gamma knife radiation  01/2018   for trigeminal neuralgia   REFRACTIVE SURGERY Bilateral 2022   RHIZOTOMY Left 04/28/2021   Procedure: Left Percutaneous trigeminal balloon rhizotomy;  Surgeon: Jadene Pierini, MD;  Location: [facility] OR;  Service: Neurosurgery;  Laterality: Left;   TONSILLECTOMY     Patient Active Problem List   Diagnosis Date Noted   Decreased diffusion capacity 08/21/2018   Trigeminal neuralgia    Lumbar back pain with radiculopathy affecting right lower extremity 01/08/2014   Dizzinesses 01/08/2014    PCP: Merri Brunette MD  REFERRING PROVIDER: Luciano Cutter, MD   REFERRING DIAG: [phone] (ICD-10-CM) - Muscular deconditioning   THERAPY  DIAG:  Muscle weakness (generalized)  Chronic pain of both knees  Unsteadiness on feet  Rationale for Evaluation and Treatment: Rehabilitation  ONSET DATE: >41yrs  SUBJECTIVE:   SUBJECTIVE STATEMENT: Pt reports she is a little stiff today.  Initial Subjective Have been doing therapy since Jan at [facility].  "I have stopped and may go back this summer if I need it. Have Oa both knees have had injections from Dr August Saucer. Trigeminal nerve pain x 4 years has had 2 gamma knife treatments and now [facility]"  PERTINENT HISTORY: Trigeminal neuralgia chronic PAIN:  3/10 - in back and L knee    Pt has stimulator is able to change program to decrease pain  PRECAUTIONS: Fall  WEIGHT BEARING RESTRICTIONS: No  FALLS:  Has patient fallen in last 6 months? No  LIVING ENVIRONMENT: Lives with: lives with their family and lives with their spouse Lives in: House/apartment Stairs: Yes: Internal: 16 steps; on right going up Has following equipment at home: None  OCCUPATION: retired  PLOF: Independent  PATIENT GOALS: more exercise that will help my knee pain  NEXT MD VISIT: end of May  OBJECTIVE:   DIAGNOSTIC FINDINGS: X-Ray 10/23 Moderate tricompartmental degenerative changes  PATIENT SURVEYS:  FOTO Risk adjusted 41% with goal of 48% 12/04/22:  FOTO  51%  COGNITION: Overall cognitive status: Within functional limits for tasks assessed     SENSATION: WFL  EDEMA: none  MUSCLE LENGTH:   POSTURE: forward head  PALPATION: No tenderness  LOWER EXTREMITY ROM:  Active ROM Right eval Left eval Right  Hip flexion     Hip extension     Hip abduction     Hip adduction     Hip internal rotation     Hip external rotation     Knee flexion 114 120 130/ 125d  Knee extension 0 0   Ankle dorsiflexion     Ankle plantarflexion     Ankle inversion     Ankle eversion      (Blank rows = not tested)  LOWER EXTREMITY MMT:  MMT  Right eval Left eval Right/ Left  Hip flexion 4- 4- 4  Hip extension     Hip abduction 5 5   Hip adduction     Hip internal rotation     Hip external rotation     Knee flexion 4 4 5   Knee extension 5 5   Ankle dorsiflexion     Ankle plantarflexion     Ankle inversion     Ankle eversion      (Blank rows = not tested)    FUNCTIONAL TESTS:  5 times sit to stand: 14.11 Berg Balance Scale: 51/56  12/08/22 5 x STS:15 Berg:52/56  GAIT: Distance walked: 500 Assistive device utilized: None Level of assistance: Complete Independence Comments: Pt wearing patellar support right and full brace left knee. Cadence slightly slowed otherwise normal   TODAY'S TREATMENT:                                                                                                                             Pt seen for aquatic therapy today.  Treatment took place in water 3.5-4.75 ft in depth at the [facility] pool. Temp of water was 91.  Pt entered/exited the pool via stairs and step to pattern with hand rail.  Exercises -walking forward, back and side stepping 4.0 ft x 3 widths ea - Solid Noodle press wide stance then staggered x 10ea - Hand Buoy Carry rainbow HB backward then forward marching x 2 widths -side stepping ue shoulder add/abd rainbow hb x 2 widths -tandem walking 3.6 ft ue support rainbow HB forward x 2 widths; backward x 2 width -forward walking with horizontal head movements x 4 widths; vertical x 2 (difficult)  -cycling x 3 mins; hip add/abd x 3 mins; skiing x 3 mins; return to cycling x  Pt requires the buoyancy and hydrostatic pressure of water for support, and to offload joints by unweighting joint load by at least 50 % in navel deep water and by at least 75-80% in chest to neck deep water.  Viscosity of the water is needed for resistance  of strengthening. Water current perturbations provides challenge to standing balance requiring increased core  activation.     PATIENT EDUCATION:  Education details: aquatic therapy exercise progressions/modifications Person educated: Patient and Spouse Education method: Explanation Education comprehension: verbalized understanding  HOME EXERCISE PROGRAM: Has active program from [employer]  Aquatics Access Code: [email or web address] URL: [email or web address] Date: 12/08/2022 Prepared by: Geni Bers This aquatic home exercise program from [employer] utilizes pictures from land based exercises, but has been adapted prior to lamination and issuance.    Exercises - Hand Buoy Carry  - 1 x daily - 7 x weekly - 3 sets - 10 reps - Side lunge with hand buoys  - 1 x daily - 7 x weekly - 3 sets - 10 reps - Braided Sidestepping with Arms Out  - 1 x daily - 7 x weekly - 3 sets - 10 reps - Noodle press  - 1 x daily - 7 x weekly - 3 sets - 10 reps - Standing 'L' Stretch at [employer]  - 1 x daily - 7 x weekly - 3 sets - 10 reps - Heel Toe Raises at Pool Wall  - 1 x daily - 7 x weekly - 3 sets - 10 reps - Standing Hip Abduction Adduction at Pool Wall  - 1 x daily - 7 x weekly - 3 sets - 10 reps - Standing March at [facility]  - 1 x daily - 7 x weekly - 3 sets - 10 reps - Standing Hip Flexion Extension at [employer]  - 1 x daily - 7 x weekly - 3 sets - 10 reps - Standing Hip Hinge  - 1 x daily - 7 x weekly - 3 sets - 10 reps - Sit to Stand  - 1 x daily - 7 x weekly - 3 sets - 10 reps  *not issued. ASSESSMENT:  CLINICAL IMPRESSION: Progressing balance challenges.  She has some unsteadiness with tandem walking but improves with repetition. She has difficulty amb with head movements losing balance frequently improving again with repetition. She does regain balance indep. LBP has reduced. Goals ongoing    PN: Pt making some progress but slow.  She has met her Foto goal and improved nicely in knee strength but only slightly in hip strength. Although scoring fairly high on Berg balance initially she has  only progressed by 1 point.  She feels her main deficit is in her balance and wishes to improve further.  She will continue to  benefit from skilled aquatic therapy intervention with focus on hip strength and balance.  Final aquatic HEP in process of being created.  She intends to gain membership at [facility] in the next few weeks to have access to a pool. Plan for 2-4 more visits.    OBJECTIVE IMPAIRMENTS: decreased balance and decreased strength.   ACTIVITY LIMITATIONS: locomotion level  PARTICIPATION LIMITATIONS: community activity and playing tennis  PERSONAL FACTORS: Age and 1-2 comorbidities: OA and trigeminal neuralgia  are also affecting patient's functional outcome.   REHAB POTENTIAL: Good  CLINICAL DECISION MAKING: Evolving/moderate complexity  EVALUATION COMPLEXITY: Moderate   GOALS: Goals reviewed with patient? Yes  SHORT TERM GOALS = LONG TERM GOALS: Target date: 12/04/22 Pt will tolerate full aquatic sessions consistently without increase in pain and with improving function to demonstrate good toleration and effectiveness of intervention.   Baseline:TBA Goal status: Met 12/08/22  2.  Pt will be indep with final aquatic HEP for continued management of condition  Baseline: TBA  Goal status: INITIAL  3.  Pt will gain access to pool in order to complete aquatic HEP Baseline: no access Goal status:In progress - 12/04/22/ In progress 01/07/23  4.  Pt will tolerate exercising in pool without knee pain limitation Baseline: pain land based Goal status: MET - 12/04/22  5.  Pt will improve berg balance score to >54/56 to demonstrate carryover from balance training in pool to land Baseline: 51/56 Goal status: In progress 01/07/23  6.  Pt to meet stated Foto Goal of 48% Baseline: 41% Goal status: MET -12/04/22  LONG TERM GOALS: Not necessary at this time   PLAN:  PT FREQUENCY: 1  PT DURATION: 6 weeks   PLANNED INTERVENTIONS: Therapeutic exercises, Therapeutic activity,  Neuromuscular re-education, Balance training, Gait training, Patient/Family education, Self Care, Joint mobilization, Stair training, Orthotic/Fit training, DME instructions, Aquatic Therapy, Dry Needling, Electrical stimulation, Moist heat, Taping, Ionotophoresis 4mg /ml Dexamethasone, Manual therapy, and Re-evaluation  PLAN FOR NEXT SESSION: Aquatics: LE and core strengthening, balance training. Aquatic HEP   Corrie Dandy [place]) Marlene Pfluger MPT 01/14/23 1:42 PM [facility] [address] [place], [place], [address] Phone: [phone]   Fax:  [phone]

## 2023-01-14 NOTE — Addendum Note (Signed)
Addended by: Lynnette Caffey F on: 01/14/2023 01:40 PM   Modules accepted: Orders

## 2023-02-04 ENCOUNTER — Encounter (HOSPITAL_BASED_OUTPATIENT_CLINIC_OR_DEPARTMENT_OTHER): Payer: Self-pay | Admitting: Physical Therapy

## 2023-02-04 ENCOUNTER — Ambulatory Visit (HOSPITAL_BASED_OUTPATIENT_CLINIC_OR_DEPARTMENT_OTHER): Payer: Medicare Other | Attending: Pulmonary Disease | Admitting: Physical Therapy

## 2023-02-04 DIAGNOSIS — M25561 Pain in right knee: Secondary | ICD-10-CM | POA: Insufficient documentation

## 2023-02-04 DIAGNOSIS — R2681 Unsteadiness on feet: Secondary | ICD-10-CM | POA: Diagnosis not present

## 2023-02-04 DIAGNOSIS — G8929 Other chronic pain: Secondary | ICD-10-CM | POA: Insufficient documentation

## 2023-02-04 DIAGNOSIS — M6281 Muscle weakness (generalized): Secondary | ICD-10-CM | POA: Insufficient documentation

## 2023-02-04 DIAGNOSIS — M25562 Pain in left knee: Secondary | ICD-10-CM | POA: Diagnosis not present
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
## 2023-02-09 ENCOUNTER — Encounter (HOSPITAL_BASED_OUTPATIENT_CLINIC_OR_DEPARTMENT_OTHER): Payer: Self-pay | Admitting: Physical Therapy

## 2023-02-09 ENCOUNTER — Ambulatory Visit (HOSPITAL_BASED_OUTPATIENT_CLINIC_OR_DEPARTMENT_OTHER): Payer: Medicare Other | Admitting: Physical Therapy

## 2023-02-09 DIAGNOSIS — M25562 Pain in left knee: Secondary | ICD-10-CM | POA: Diagnosis not present

## 2023-02-09 DIAGNOSIS — G8929 Other chronic pain: Secondary | ICD-10-CM

## 2023-02-09 DIAGNOSIS — M6281 Muscle weakness (generalized): Secondary | ICD-10-CM | POA: Diagnosis not present

## 2023-02-09 DIAGNOSIS — R2681 Unsteadiness on feet: Secondary | ICD-10-CM

## 2023-02-09 DIAGNOSIS — M25561 Pain in right knee: Secondary | ICD-10-CM | POA: Diagnosis not present

## 2023-02-09 NOTE — Therapy (Signed)
OUTPATIENT PHYSICAL THERAPY LOWER EXTREMITY TREATMENT     Patient Name: [name] MRN: [id number] DOB:1941/02/24, 82 y.o., female Today's Date: 02/09/2023  END OF SESSION:  PT End of Session - 02/09/23 1032     Visit Number 10    Number of Visits 12    Date for PT Re-Evaluation 02/19/23    PT Start Time 1031    PT Stop Time 1110    PT Time Calculation (min) 39 min    Activity Tolerance Patient tolerated treatment well    Behavior During Therapy [facility] for tasks assessed/performed               Past Medical History:  Diagnosis Date   Adenomatous colon polyp 03/24/2018   Allergy    Arthritis    Cataract    Chronic headaches    Depression    Fibromyalgia    Gallstones    GERD (gastroesophageal reflux disease)    Hyperlipemia    Hypertension    Interstitial cystitis    Lactose intolerance    LBBB (left bundle branch block)    Osteoarthritis    Osteopenia    Pneumonia    Thyroid disease    Tinnitus    Trigeminal neuralgia    Past Surgical History:  Procedure Laterality Date   CHOLECYSTECTOMY  03/2010   COLONOSCOPY  2009   Dr. Harvie Junior    CYSTOSCOPY     Multiple Cystoscopies with stents or biopsy    ESOPHAGOGASTRODUODENOSCOPY  10/2013   EYE SURGERY     bilateral cataract removal   Gamma knife radiation  01/2018   for trigeminal neuralgia   REFRACTIVE SURGERY Bilateral 2022   RHIZOTOMY Left 04/28/2021   Procedure: Left Percutaneous trigeminal balloon rhizotomy;  Surgeon: Jadene Pierini, MD;  Location: [facility] OR;  Service: Neurosurgery;  Laterality: Left;   TONSILLECTOMY     Patient Active Problem List   Diagnosis Date Noted   Decreased diffusion capacity 08/21/2018   Trigeminal neuralgia    Lumbar back pain with radiculopathy affecting right lower extremity 01/08/2014   Dizzinesses 01/08/2014    PCP: Merri Brunette MD  REFERRING PROVIDER: Luciano Cutter, MD   REFERRING DIAG: [phone] (ICD-10-CM) - Muscular deconditioning   THERAPY  DIAG:  Muscle weakness (generalized)  Chronic pain of both knees  Unsteadiness on feet  Rationale for Evaluation and Treatment: Rehabilitation  ONSET DATE: >3yrs  SUBJECTIVE:   SUBJECTIVE STATEMENT: Pt reports she plans to join [employer] at d/c.  Back was hurting yesterday - did a hip stretch and it is feeling a little better today.   PERTINENT HISTORY: Trigeminal neuralgia chronic PAIN:  5/10 - in back and L knee    Pt has stimulator is able to change program to decrease pain  PRECAUTIONS: Fall  WEIGHT BEARING RESTRICTIONS: No  FALLS:  Has patient fallen in last 6 months? No  LIVING ENVIRONMENT: Lives with: lives with their family and lives with their spouse Lives in: House/apartment Stairs: Yes: Internal: 16 steps; on right going up Has following equipment at home: None  OCCUPATION: retired  PLOF: Independent  PATIENT GOALS: more exercise that will help my knee pain  NEXT MD VISIT: end of May  OBJECTIVE:   DIAGNOSTIC FINDINGS: X-Ray 10/23 Moderate tricompartmental degenerative changes   PATIENT SURVEYS:  FOTO Risk adjusted 41% with goal of 48% 12/04/22:  FOTO  51%  COGNITION: Overall cognitive status: Within functional limits for tasks assessed     SENSATION: WFL  EDEMA: none  MUSCLE LENGTH:   POSTURE: forward head  PALPATION: No tenderness  LOWER EXTREMITY ROM:  Active ROM Right eval Left eval Right  Hip flexion     Hip extension     Hip abduction     Hip adduction     Hip internal rotation     Hip external rotation     Knee flexion 114 120 130/ 125d  Knee extension 0 0   Ankle dorsiflexion     Ankle plantarflexion     Ankle inversion     Ankle eversion      (Blank rows = not tested)  LOWER EXTREMITY MMT:  MMT Right eval Left eval Right/ Left  Hip flexion 4- 4- 4  Hip extension     Hip abduction 5 5   Hip adduction     Hip internal rotation     Hip external rotation     Knee flexion 4 4 5   Knee extension 5 5   Ankle  dorsiflexion     Ankle plantarflexion     Ankle inversion     Ankle eversion      (Blank rows = not tested)    FUNCTIONAL TESTS:  5 times sit to stand: 14.11 Berg Balance Scale: 51/56  12/08/22 5 x STS:15 Berg:52/56  GAIT: Distance walked: 500 Assistive device utilized: None Level of assistance: Complete Independence Comments: Pt wearing patellar support right and full brace left knee. Cadence slightly slowed otherwise normal   TODAY'S TREATMENT:                                           Berg - 54/56                                                                                   Pt seen for aquatic therapy today.  Treatment took place in water 3.5-4.75 ft in depth at the [facility] pool. Temp of water was 91.  Pt entered/exited the pool via stairs and step to pattern with hand rail.  -walking forward, backward and side stepping 4.0 ft x 3 laps -side stepping with ue add/abd x 4 widths with rainbow hand floats - SLS x 20 no UE support - tandem gait forward / backward 2 laps - braiding 2 laps - Marching x 2 laps  - hollow Noodle press wide stance  x 10ea   Pt requires the buoyancy and hydrostatic pressure of water for support, and to offload joints by unweighting joint load by at least 50 % in navel deep water and by at least 75-80% in chest to neck deep water.  Viscosity of the water is needed for resistance of strengthening. Water current perturbations provides challenge to standing balance requiring increased core activation.     PATIENT EDUCATION:  Education details: aquatic therapy exercise progressions/modifications Person educated: Patient and Spouse Education method: Explanation Education comprehension: verbalized understanding  HOME EXERCISE PROGRAM: Has active program from [employer]  Aquatics Access Code: [email or web address] URL: [email or web address] Date: 8/20//2024 Prepared by: Geni Bers This aquatic home exercise program from  [employer]  utilizes pictures from land based exercises, but has been adapted prior to lamination and issuance.  ASSESSMENT:  CLINICAL IMPRESSION: Pt reported gradual reduction of pain during session, especially with marching.  Laminated HEP issued today - plan to review and prepare for d/c next visit.  Pt has met LTG 5 and making good progress towards remaining goals.         OBJECTIVE IMPAIRMENTS: decreased balance and decreased strength.   ACTIVITY LIMITATIONS: locomotion level  PARTICIPATION LIMITATIONS: community activity and playing tennis  PERSONAL FACTORS: Age and 1-2 comorbidities: OA and trigeminal neuralgia  are also affecting patient's functional outcome.   REHAB POTENTIAL: Good  CLINICAL DECISION MAKING: Evolving/moderate complexity  EVALUATION COMPLEXITY: Moderate   GOALS: Goals reviewed with patient? Yes  SHORT TERM GOALS = LONG TERM GOALS: Target date:02/19/23 Pt will tolerate full aquatic sessions consistently without increase in pain and with improving function to demonstrate good toleration and effectiveness of intervention.   Baseline:TBA Goal status: Met 12/08/22  2.  Pt will be indep with final aquatic HEP for continued management of condition  Baseline: TBA Goal status: in progress 02/04/23  3.  Pt will gain access to pool in order to complete aquatic HEP Baseline: no access Goal status:In progress - plans to join at d/c 02/09/23  4.  Pt will tolerate exercising in pool without knee pain limitation Baseline: pain land based Goal status: MET - 12/04/22  5.  Pt will improve berg balance score to >54/56 to demonstrate carryover from balance training in pool to land Baseline: 51/56 Goal status: Met - 02/09/23  6.  Pt to meet stated Foto Goal of 48% Baseline: 41% Goal status: MET -12/04/22     PLAN:  PT FREQUENCY: 1xweek  PT DURATION: 6 weeks   PLANNED INTERVENTIONS: Therapeutic exercises, Therapeutic activity, Neuromuscular re-education,  Balance training, Gait training, Patient/Family education, Self Care, Joint mobilization, Stair training, Orthotic/Fit training, DME instructions, Aquatic Therapy, Dry Needling, Electrical stimulation, Moist heat, Taping, Ionotophoresis 4mg /ml Dexamethasone, Manual therapy, and Re-evaluation  PLAN FOR NEXT SESSION: Aquatics: LE and core strengthening, balance training. Aquatic HEP

## 2023-02-09 NOTE — Addendum Note (Signed)
Addended by: Lynnette Caffey F on: 02/09/2023 05:34 PM   Modules accepted: Orders

## 2023-02-11 ENCOUNTER — Ambulatory Visit (HOSPITAL_BASED_OUTPATIENT_CLINIC_OR_DEPARTMENT_OTHER): Payer: Medicare Other | Admitting: Physical Therapy

## 2023-02-11 DIAGNOSIS — G509 Disorder of trigeminal nerve, unspecified: Secondary | ICD-10-CM | POA: Diagnosis not present

## 2023-02-11 DIAGNOSIS — G500 Trigeminal neuralgia: Secondary | ICD-10-CM | POA: Diagnosis not present

## 2023-02-12 DIAGNOSIS — H353112 Nonexudative age-related macular degeneration, right eye, intermediate dry stage: Secondary | ICD-10-CM | POA: Diagnosis not present

## 2023-02-12 DIAGNOSIS — H353123 Nonexudative age-related macular degeneration, left eye, advanced atrophic without subfoveal involvement: Secondary | ICD-10-CM | POA: Diagnosis not present

## 2023-02-12 DIAGNOSIS — H43813 Vitreous degeneration, bilateral: Secondary | ICD-10-CM | POA: Diagnosis not present

## 2023-02-12 DIAGNOSIS — H35372 Puckering of macula, left eye: Secondary | ICD-10-CM | POA: Diagnosis not present

## 2023-02-12 DIAGNOSIS — H35033 Hypertensive retinopathy, bilateral: Secondary | ICD-10-CM | POA: Diagnosis not present

## 2023-02-18 ENCOUNTER — Ambulatory Visit (HOSPITAL_BASED_OUTPATIENT_CLINIC_OR_DEPARTMENT_OTHER): Payer: Medicare Other | Admitting: Physical Therapy

## 2023-02-18 ENCOUNTER — Encounter (HOSPITAL_BASED_OUTPATIENT_CLINIC_OR_DEPARTMENT_OTHER): Payer: Self-pay | Admitting: Physical Therapy

## 2023-02-18 DIAGNOSIS — M6281 Muscle weakness (generalized): Secondary | ICD-10-CM

## 2023-02-18 DIAGNOSIS — G8929 Other chronic pain: Secondary | ICD-10-CM

## 2023-02-18 DIAGNOSIS — R2681 Unsteadiness on feet: Secondary | ICD-10-CM
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
## 2023-02-19 ENCOUNTER — Encounter (HOSPITAL_BASED_OUTPATIENT_CLINIC_OR_DEPARTMENT_OTHER): Payer: Self-pay | Admitting: Physical Therapy

## 2023-02-19 ENCOUNTER — Ambulatory Visit (HOSPITAL_BASED_OUTPATIENT_CLINIC_OR_DEPARTMENT_OTHER): Payer: Medicare Other | Admitting: Physical Therapy

## 2023-02-19 DIAGNOSIS — R2681 Unsteadiness on feet: Secondary | ICD-10-CM

## 2023-02-19 DIAGNOSIS — M25561 Pain in right knee: Secondary | ICD-10-CM | POA: Diagnosis not present

## 2023-02-19 DIAGNOSIS — G8929 Other chronic pain: Secondary | ICD-10-CM | POA: Diagnosis not present

## 2023-02-19 DIAGNOSIS — M6281 Muscle weakness (generalized): Secondary | ICD-10-CM

## 2023-02-19 DIAGNOSIS — M25562 Pain in left knee: Secondary | ICD-10-CM | POA: Diagnosis not present

## 2023-02-19 NOTE — Therapy (Signed)
OUTPATIENT PHYSICAL THERAPY LOWER EXTREMITY TREATMENT  PHYSICAL THERAPY DISCHARGE SUMMARY  Visits from Start of Care: 11  Current functional level related to goals / functional outcomes: Indep with all ADL's and functional mobility   Remaining deficits: Improving strength   Education / Equipment: Management of condition; HEP   Patient agrees to discharge. Patient goals were met. Patient is being discharged due to meeting the stated rehab goals.    Patient Name: [name] MRN: [id number] DOB:[birth year], 82 y.o., female Today's Date: 02/19/2023  END OF SESSION:  PT End of Session - 02/19/23 0908     Visit Number 11    Number of Visits 12    Date for PT Re-Evaluation 02/19/23    PT Start Time 0901    PT Stop Time 0945    PT Time Calculation (min) 44 min    Activity Tolerance Patient tolerated treatment well    Behavior During Therapy [facility] for tasks assessed/performed               Past Medical History:  Diagnosis Date   Adenomatous colon polyp 03/24/2018   Allergy    Arthritis    Cataract    Chronic headaches    Depression    Fibromyalgia    Gallstones    GERD (gastroesophageal reflux disease)    Hyperlipemia    Hypertension    Interstitial cystitis    Lactose intolerance    LBBB (left bundle branch block)    Osteoarthritis    Osteopenia    Pneumonia    Thyroid disease    Tinnitus    Trigeminal neuralgia    Past Surgical History:  Procedure Laterality Date   CHOLECYSTECTOMY  03/2010   COLONOSCOPY  2009   Dr. 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  Feeling good with aquatic HEP  PERTINENT HISTORY: Trigeminal neuralgia chronic PAIN:  5/10 - in back and L knee    Pt has stimulator is able to change program to decrease pain  PRECAUTIONS: Fall  WEIGHT BEARING RESTRICTIONS: No  FALLS:  Has patient fallen in last 6 months? No  LIVING ENVIRONMENT: Lives with: lives with their family and lives with their spouse Lives in: House/apartment Stairs: Yes: Internal: 16 steps; on right going up Has following equipment at home: None  OCCUPATION: retired  PLOF: Independent  PATIENT GOALS: more exercise that will help my knee pain  NEXT MD VISIT: end of May  OBJECTIVE:  DIAGNOSTIC FINDINGS: X-Ray 10/23 Moderate tricompartmental degenerative changes   PATIENT SURVEYS:  FOTO Risk adjusted 41% with goal of 48% 12/04/22:  FOTO  51%  COGNITION: Overall cognitive status: Within functional limits for tasks assessed     SENSATION: WFL  EDEMA: none  MUSCLE LENGTH:   POSTURE: forward head  PALPATION: No tenderness  LOWER EXTREMITY ROM:  Active ROM Right eval Left eval Right  Hip flexion     Hip extension     Hip abduction     Hip adduction     Hip internal rotation     Hip external rotation     Knee flexion 114 120 130/ 125d  Knee extension 0 0   Ankle dorsiflexion      Ankle plantarflexion     Ankle inversion     Ankle eversion      (Blank rows = not tested)  LOWER EXTREMITY MMT:  MMT Right eval Left eval Right/ Left  Hip flexion 4- 4- 4  Hip extension     Hip abduction 5 5   Hip adduction     Hip internal rotation     Hip external rotation     Knee flexion 4 4 5   Knee extension 5 5   Ankle dorsiflexion     Ankle plantarflexion     Ankle inversion     Ankle eversion      (Blank rows = not tested)    FUNCTIONAL TESTS:  5 times sit to stand: 14.11 Berg Balance Scale: 51/56  12/08/22 5 x STS:15 Berg:52/56  GAIT: Distance walked: 500 Assistive device utilized: None Level of assistance: Complete Independence Comments: Pt wearing patellar support right and full brace left knee. Cadence slightly slowed otherwise normal   TODAY'S TREATMENT:                                                                                                             Pt seen for aquatic therapy today.  Treatment took place in water 3.5-4.75 ft in depth at the [facility] pool. Temp of water was 91.  Pt entered/exited the pool via stairs and step to pattern with hand rail.   Exercises - Hand float carry - Side Stepping with Hand Floats  - Braided Sidestepping with Arms Out   - Noodle press - Standing 'L' Stretch at [employer]   - Sit to Stand   - Standing 3-Way Leg Reach with Ankle Floats   - Heel Toe Raises with Counter Support   - Standing Hip Abduction   - Forward Backward Leg Swing - hold wall or noodle  - Walking March - Single Leg Stance   - Walking Tandem Stance     Pt requires the buoyancy and hydrostatic pressure of water for support, and to offload joints by unweighting joint load by at least 50 % in navel deep water and by at least 75-80% in chest to neck deep water.  Viscosity of the water is needed for resistance of strengthening. Water current perturbations provides challenge to standing  balance requiring increased core  activation.     PATIENT EDUCATION:  Education details: aquatic therapy exercise progressions/modifications Person educated: Patient and Spouse Education method: Explanation Education comprehension: verbalized understanding  HOME EXERCISE PROGRAM: Has active program from [employer]  Aquatics Access Code: [email or web address] URL: [email or web address] Date: 8/20//2024 Prepared by: Geni Bers This aquatic home exercise program from [employer] utilizes pictures from land based exercises, but has been adapted prior to lamination and issuance.    Exercises - Hand float carry  - 1 x daily - 3 x weekly - Side Stepping with Hand Floats  - 1 x daily - 3 x weekly - Braided Sidestepping with Arms Out  - 1 x daily - 3 x weekly - Noodle press  - 1 x daily - 3 x weekly - 1 sets - 10 reps - Standing 'L' Stretch at [employer]  - 1 x daily - 3 x weekly - 2 reps - 10-15 seconds hold - Sit to Stand  - 1 x daily - 3 x weekly - 1-2 sets - 10 reps - Standing 3-Way Leg Reach with Ankle Floats  - 1 x daily - 3 x weekly - 3 sets - 10 reps - Heel Toe Raises with Counter Support  - 1 x daily - 3 x weekly - 1-2 sets - 10 reps - Standing Hip Abduction  - 1 x daily - 3 x weekly - 1-2 sets - 10 reps - Forward Backward Leg Swing - hold wall or noodle   - 1 x daily - 3 x weekly - 1-2 sets - 10 reps - Walking March  - 1 x daily - 3 x weekly - Single Leg Stance  - 1 x daily - 3 x weekly - 2-3 reps - 15-30 seconds  hold - Walking Tandem Stance  - 1 x daily - 3 x weekly ASSESSMENT:  CLINICAL IMPRESSION: Pt requires further vc and demonstration/clarification of aquatic HEP.  She is easily directed and executes tasks well.  Verbalizes and demonstrates understanding and indep of program.  N reports of pain.  Does need ue support with varius balance challenges for proper technique.  All goals met.  She is ready for DC.   OBJECTIVE IMPAIRMENTS: decreased balance and decreased strength.   ACTIVITY LIMITATIONS:  locomotion level  PARTICIPATION LIMITATIONS: community activity and playing tennis  PERSONAL FACTORS: Age and 1-2 comorbidities: OA and trigeminal neuralgia  are also affecting patient's functional outcome.   REHAB POTENTIAL: Good  CLINICAL DECISION MAKING: Evolving/moderate complexity  EVALUATION COMPLEXITY: Moderate   GOALS: Goals reviewed with patient? Yes  SHORT TERM GOALS = LONG TERM GOALS: Target date:02/19/23 Pt will tolerate full aquatic sessions consistently without increase in pain and with improving function to demonstrate good toleration and effectiveness of intervention.   Baseline:TBA Goal status: Met 12/08/22  2.  Pt will be indep with final aquatic HEP for continued management of condition  Baseline: TBA Goal status: in progress 02/04/23; Met 02/19/23  3.  Pt will gain access to pool in order to complete aquatic HEP Baseline: no access Goal status:In progress - plans to join at d/c 02/09/23/ Met 02/19/23  4.  Pt will tolerate exercising in pool without knee pain limitation Baseline: pain land based Goal status: MET - 12/04/22  5.  Pt will improve berg balance score to >54/56 to demonstrate carryover from balance training in pool to land Baseline: 51/56 Goal status: Met - 02/09/23  6.  Pt to meet stated Foto Goal of 48% Baseline:  41% Goal status: MET -12/04/22     PLAN:  PT FREQUENCY: 1xweek  PT DURATION: 6 weeks   PLANNED INTERVENTIONS: Therapeutic exercises, Therapeutic activity, Neuromuscular re-education, Balance training, Gait training, Patient/Family education, Self Care, Joint mobilization, Stair training, Orthotic/Fit training, DME instructions, Aquatic Therapy, Dry Needling, Electrical stimulation, Moist heat, Taping, Ionotophoresis 4mg /ml Dexamethasone, Manual therapy, and Re-evaluation  PLAN FOR NEXT SESSION:DC

## 2023-02-23 ENCOUNTER — Encounter: Payer: Self-pay | Admitting: Physical Medicine and Rehabilitation

## 2023-02-23 ENCOUNTER — Encounter
Payer: Medicare Other | Attending: Physical Medicine and Rehabilitation | Admitting: Physical Medicine and Rehabilitation

## 2023-02-23 VITALS — BP 149/75 | HR 67 | Ht 62.5 in | Wt 144.0 lb

## 2023-02-23 DIAGNOSIS — G500 Trigeminal neuralgia: Secondary | ICD-10-CM | POA: Diagnosis not present

## 2023-02-23 DIAGNOSIS — M5430 Sciatica, unspecified side: Secondary | ICD-10-CM | POA: Insufficient documentation

## 2023-02-23 MED ORDER — HYDROCODONE-ACETAMINOPHEN 5-325 MG PO TABS: 1.0000 | ORAL_TABLET | Freq: Every day | ORAL | 0 refills | Status: DC | PRN

## 2023-02-23 MED ORDER — DULOXETINE HCL 30 MG PO CPEP: 90.0000 mg | ORAL_CAPSULE | Freq: Every day | ORAL | 3 refills | Status: DC

## 2023-02-23 NOTE — Progress Notes (Addendum)
Subjective:    Patient ID: [name], female    DOB: 11/06/1940, 82 y.o.   MRN: [id number]  HPI  [name] presents today for f/u of severe trigeminal neuralgia.   1) Severe trigeminal neuralgia: -today pain is 7/10 -she is doing ok -she likes her new physician at [facility]. He works a lot with the [employer] rep because the rep is just for back pain -he takes pictures and asks for the location to be adjusted -at [facility] she is just given one program and then it stops working.  -Saw Dr. Gloriann Loan 8 weeks ago and the new programs worked but then it stops working when she returns home -she is seeing neurosurgeon Dr. Lenord Fellers on Thursday  [name] researched red light therapy -she went to see the doctor in [place] and was only been able to spend 5 minutes with her -they did about 15 minutes of adjustments and the new programs seemed to hurt more than they help -has had stimulator turned off since last night -was able to enjoy her birthday -she did well for 3 weeks but recently pain has been around 8-10 -when pain is very severe it makes her eye hurt -the way her stimulator is set it makes her nose hurt She is going back to [facility] on April 10th -she is managing with the gabapentin and had to take 2 extra yesterday and it did not make her sleepier  -pain fluctuates  -she went to [place] last week with her children.  -she keeps a journal of when she changes the program and what her pain level is in her teeth, eyes, and cheek. She put in not to use the teeth program -she said if they could hit the nerve between the two teeth that would be helpful -they have her an 11 and 12 in that region,  -she cannot get the right level of stimulation. Before she left she decreased it by 50%. Now she is on a cheat program and the lowest amount. It is helping the teeth but hurting the eye.  -Dr. Gloriann Loan had a baby on 11/14 and she is not coming back until January.  -she talked to the representative -last night  she turned off the stimulator -she slept almost day today and did not want to wake up for supper which was rare for her -she is having a difficult time weaning off the hydrocodone.  -she has been off of it in two days -she is expecting a return call from Dr. Paulo Fruit team soon to discuss wean off fentanyl -drove up on Tuesday and on Wednesday was supposed to have an appointment with pain psychologist. When they got there they were told that appointment had been changed to virtual. The next day they had a more sophisticated MRI. [name] did moving in MRI so MRI was not as helpful. The next day they saw Dr. Gloriann Loan and discussed several procedural options and then went back to the motor stimulator- that is scheduled on October 16th.  -she recently went on vacation and had to spend the majority of the vacation in bed due to the severity of her pain -she stopped using the intranasal ketamine because although it helped initially, it appeared to make her pain worse later -she asks if she can wean off some medication as she had a recent appointment with Dr. Gloriann Loan and was told she may have a lot of postoperative pain if she is taking so much pain medication. She would still like to  do this and also experiences sedation from her medication. She asks how she would go about weaning her medications -she asks if she should wean off the gabapentin since this makes her so sleepy She asks if she should restart Topamax. -she was encouraged by her phone visit with Dr. Gloriann Loan and plans to pursue surgery. She is going to [facility] next week for Brain MRI, neuropsych eval, and to discuss the risks and benefits of the procedure with Dr. Gloriann Loan -pain was so severe in the past that she expressed suicidality to her husband [name].  -she feels that none of her medications are helpful but she knows that when she tries to wean them her pain worsens.  -she asks about refill of the compounding cream -her husband asks about referral to  ketamine clinic for depression rather than pain -she went back to the dentist because it was urting between two teeth and he said there is nothing wrong with those teeth and it is the trigeminal nerve When she took a shower that usually seems to help with but was migrating around her face.  -appointment with neurology in July -Dr. Gloriann Loan cancelled the zoom call in April but this was rescheduled to July.  -she finds that only the gabapentin helps -she is not sure if the fentanyl helps When she is feeling really bad she tries to take another one but she gets very sleepy and dizzy.  -she is taking 8 a day of 100mg  per day- she takes two gabapentin and tylenol and hydrocodone in the morning. She takes another two at breakfast around 11, another 2 at lunch around 1:30pm.  -she had a recent root canal and pain has become much more severe after that -her pain is in the two teeth next to where she had the root canal -Oragel heps for a bout an hour.  -she tried increasing her Gabapentin to 300mg  TID but she could not tolerate this. It made her too sleepy.  -she contacted Dr. Lorrine Kin and he recommended she pause wean of Fentanyl patch and I agree with this -she took last patch last night. She still has 12.62mcg patch available -her husband asks whether she should take a higher dose of gabapentin -the fentanyl patch makes her very sleepy and so allows her to sleep. It has also made her dizzier.  -the fentanyl patch did not provide more relief- she would like to wean off. Weaned to 25, then 12.9mcg, then off this week -referral was to [facility] and not to Dr. Hector Shade she states and they need a new referral send to Dr. Clint Lipps -she is using the compound cream and this helps- she uses this twice per day -capsaicin made it burn  -lamotrigine makes her too sleepy and dizzy to come down the stairs by herself and she does not feel comfortable going to the shower -left eye more open than  usual. -she does not feel she is getting relief from the fentanyl patch -she feels that the procedure at [facility] is a last resort.  -Given darkening of skin and worsening eye lid closure, MRI brain stat obtained and shows new cyst that could be post-surgical. Recommended she go to ED given worsening dizziness and facial swelling as well. ED physician discussed case with NSGY and neurology and discharged home recommending follow-up with Dr. Belva Bertin and Dr. Molli Hazard. She was given IV Fentanyl in the ED which helped and discharged on Fentanyl patch, which has not helped.  -discussed increasing Fentanyl  patch to since she has not experienced any negative side effects, no respiratory depression, and her husband is agreeable. I have sent in new script to start Saturday, but insurance required prior auth, which was approved.  -tonight would be the third tay of the Fentanyl patch -she is still taking the Gabapentin -she feels that when she lays down she is spinning.  -eye continues to appear more closed.  -Husband has called Dr. Kathlene Cote and Dr. Barb Merino office to scheduled follow-up appointments and has appointments with both tomorrow.  -Will run out of Fentanyl patch by Sunday -pain is worsening and nothing is providing relief -she asks whether she should go to ED -she has also noted dizziness and is not sure if this is from being in the MRI or from the [place].  -morphine was not effective and caused itching, sarna lotion did help wit this.  -hydrocodone has been most effective thus far -nucynta was not effective but she is not sure whether she gave it a fair trial -she has month appointments scheduled with Riley Lam and f/u with me in Feb -she used the Norco last night and this morning with good benefit and has started to feel the pain return this afternoon -she did find benefit from the compound cream- she continues to use this.  -She continues to have a sharp shooting pain  doing around her eyelid and the tip teeth and in the top of her head.  -has a radiofrequency ablation treatment that helped but not completely -she is not having as much sharp knife-like pin like in her eye Her top teeth in her left law have been hurting really badly.  -she now takes the baclofen with the gabapentin and they seem to work well together.  -she had surgical procedure and feels worse -yesterday she took a half a tablet but this morning she took a whole one. She does fine with the hydrocodone but a whole one makes her too sleepy.  -she is taking 2 100mg  Gabapentin three times per day but it makes her dizzy and sleepy so she cannot funciton -the cold worsens her pain.  -The IV fentanyl did help with the pain.  -her husband feels she should get one more dose of the 25mg  fentanyl. -she did get one week benefit from the steroid injection.  -she is doing terribly -she is miserable -she feels depressed due to the pain -she has considered taking the whole bottle of hydrocodone because because she can't take this pain anymore.  -she started taking the Gabapentin 300mg  TID.  -she does find relief from the hydrocodone -the pain is constant -she wonders if it is inflammation.  -she has not tried turmeric. Her son-in-law gave her a bottle and she couldn't stomach it.   2) Fibromyalgia: -She has been almost nonfunctional due to the severity of her pain. It continues to be very severe.  -She would like to wean off the Gabapentin as she feels at risk of falls -she is taking Topamax BID. She felt this was helpful but made her unsteady on her feet.  -still using gabapentin and this is helping.  -she feels so fatigued that she can only have a shower every other day.   3) Osteoporosis: -husband asks whether she should get a Prolia shot.   4) Dizziness: -head was swimming -she felt like she was going to fall.  -worsening -feels like she is spinning, not the room  -She tried the  Phenytoin for 2 days and did  not feel benefit so stopped it.   -she feels the gabapentin aggravates this and she would love to wean off but knows it helps her.   -She feels very sleepy during the day.   -She has never tried Topirmate before.   5) Sore throat -developed stopping lamotrigine since not helping.   Prior history:  Her pain continues to be in her left eye, forehead. She has not tried any topicals for pain. She uses capsaicin for her back and knees.   Since last visit nothing we have tried has helped with her pain. She has tried to decrease her Gabapentin dose but then her pain increased so she has resumed taking 4 Gabapentin per day.   Her husband asks whether he should contact her surgeon regarding whether nerve may have become unclamped.   Discussed prolotherapy, trigger point injections, Baclofen, Clonazepam, Phenytoin as other options we could try,=.  Pain continues to be excruciating and she states she is not sure how much longer she can live like this.   Prior history: She has having some nausea, lightheadedness and felt this may be related to her Gabapentin. She did not take any yesterday and then felt more sever pain in the evening. She also gets electrical shooting across her jaw.   She does not feel that she has cluster headaches as the pain has been so constant. She feels that she is getting worse. The oxygen made her feel relaxed but it is not helping.   The Sarna lotion does help with the itching in her head.   She was willing to try the Amitriptyline but it makes her too sleepy in the morning.   Her teeth and left eye incredibly hurt.   She feels that the Gabapentin has been making her dizziness.   She asks about biofeedback.   The pain is 7/10 on average, similar to last time.   When the pain is severe it can make her depressed.    6) Macular degeneration:  Has been following with opthalmology  Pain Inventory Average pain 10 Pain Right Now  10 My pain is constant, sharp, burning, dull, stabbing, and aching  In the last 24 hours, has pain interfered with the following? General activity 8 Relation with others 8 Enjoyment of life 10  What TIME of day is your pain at its worst? morning  and daytime Sleep (in general) Good  Pain is worst with  bending & standing, walking, sitting, inactivity, some activities  Pain improves with rest, heat, medication, therapy, injections  Relief with medication is 6   Family History  Problem Relation Age of Onset   Heart disease Mother    Heart disease Father    Stroke Father    Stroke Sister    Asthma Daughter    Colon cancer Neg Hx    Social History   Socioeconomic History   Marital status: Married    Spouse name: Not on file   Number of children: 2   Years of education: college   Highest education level: Not on file  Occupational History   Occupation: housewife    Employer: RETIRED  Tobacco Use   Smoking status: Never   Smokeless tobacco: Never  Vaping Use   Vaping status: Never Used  Substance and Sexual Activity   Alcohol use: No    Alcohol/week: 0.0 standard drinks of alcohol   Drug use: No   Sexual activity: Not on file  Other Topics Concern   Not on file  Social History Narrative   1 cup of Jasmine tea daily    Social Determinants of Health   Financial Resource Strain: Low Risk  (12/31/2022)   Received from [facility]   Overall Financial Resource Strain (CARDIA)    Difficulty of Paying Living Expenses: Not hard at all  Food Insecurity: No Food Insecurity (12/31/2022)   Received from [facility]   Hunger Vital Sign    Worried About Running Out of Food in the Last Year: Never true    Ran Out of Food in the Last Year: Never true  Transportation Needs: No Transportation Needs (12/31/2022)   Received from [facility] - Transportation    In the past 12 months, has lack of transportation kept you  from medical appointments or from getting medications?: No    Lack of Transportation (Non-Medical): No  Physical Activity: Inactive (11/13/2019)   Received from [facility] visits prior to 08/22/2022., [facility] visits prior to 08/22/2022.   Exercise Vital Sign    Days of Exercise per Week: 0 days    Minutes of Exercise per Session: 0 min  Stress: No Stress Concern Present (11/13/2019)   Received from [facility] visits prior to 08/22/2022., [facility] visits prior to 08/22/2022.   [employer] of Occupational Health - Occupational Stress Questionnaire    Feeling of Stress : Not at all  Social Connections: Socially Integrated (11/13/2019)   Received from [facility] visits prior to 08/22/2022., [facility] visits prior to 08/22/2022.   Social Connection and Isolation Panel [NHANES]    Frequency of Communication with Friends and Family: More than three times a week    Frequency of Social Gatherings with Friends and Family: More than three times a week    Attends Religious Services: 1 to 4 times per year    Active Member of [employer] or Organizations: Yes    Attends Banker Meetings: 1 to 4 times per year    Marital Status: Married   Past Surgical History:  Procedure Laterality Date   CHOLECYSTECTOMY  03/2010   COLONOSCOPY  2009   Dr. Harvie Junior    CYSTOSCOPY     Multiple Cystoscopies with stents or biopsy    ESOPHAGOGASTRODUODENOSCOPY  10/2013   EYE SURGERY     bilateral cataract removal   Gamma knife radiation  01/2018   for trigeminal neuralgia   REFRACTIVE SURGERY Bilateral 2022   RHIZOTOMY Left 04/28/2021   Procedure: Left Percutaneous trigeminal balloon rhizotomy;  Surgeon: Jadene Pierini, MD;  Location: [facility] OR;  Service: Neurosurgery;  Laterality: Left;   TONSILLECTOMY     Past Surgical History:  Procedure Laterality Date    CHOLECYSTECTOMY  03/2010   COLONOSCOPY  2009   Dr. Harvie Junior    CYSTOSCOPY     Multiple Cystoscopies with stents or biopsy    ESOPHAGOGASTRODUODENOSCOPY  10/2013   EYE SURGERY     bilateral cataract removal   Gamma knife radiation  01/2018   for trigeminal neuralgia   REFRACTIVE SURGERY Bilateral 2022   RHIZOTOMY Left 04/28/2021   Procedure: Left Percutaneous trigeminal balloon rhizotomy;  Surgeon: Jadene Pierini, MD;  Location: [facility] OR;  Service: Neurosurgery;  Laterality: Left;   TONSILLECTOMY     Past Medical History:  Diagnosis Date   Adenomatous colon polyp 03/24/2018   Allergy    Arthritis  Cataract    Chronic headaches    Depression    Fibromyalgia    Gallstones    GERD (gastroesophageal reflux disease)    Hyperlipemia    Hypertension    Interstitial cystitis    Lactose intolerance    LBBB (left bundle branch block)    Osteoarthritis    Osteopenia    Pneumonia    Thyroid disease    Tinnitus    Trigeminal neuralgia    BP (!) 149/75   Pulse 67   Ht 5' 2.5" (1.588 m)   Wt 144 lb (65.3 kg)   SpO2 97%   BMI 25.92 kg/m   Opioid Risk Score:   Fall Risk Score:  `1  Depression screen [facility] 2/9     02/23/2023   11:21 AM 12/28/2022    1:35 PM 10/27/2022    2:05 PM 09/15/2022    1:25 PM 07/21/2022    1:38 PM 05/19/2022    1:11 PM 02/03/2022    1:29 PM  Depression screen PHQ 2/9  Decreased Interest 0 0 0 0 1 0 0  Down, Depressed, Hopeless 0 0 0 0 1 0 0  PHQ - 2 Score 0 0 0 0 2 0 0    Review of Systems  Constitutional: Negative.   HENT: Negative.    Eyes:  Positive for visual disturbance.  Respiratory: Negative.    Cardiovascular: Negative.   Gastrointestinal: Negative.   Endocrine: Negative.   Genitourinary: Negative.   Musculoskeletal: Negative.        Pain in left side of face  Skin: Negative.   Allergic/Immunologic: Negative.   Neurological:  Positive for dizziness, tremors, light-headedness and numbness.       Tingling   Hematological:  Negative.   Psychiatric/Behavioral: Negative.    All other systems reviewed and are negative.      Objective:   Physical Exam Gen: no distress, normal appearing, BP 149/75. Weight 144lbs, BMI 25.92 Gen: no distress, normal appearing HEENT: oral mucosa pink and moist, NCAT Cardio: Reg rate Chest: normal effort, normal rate of breathing Abd: soft, non-distended Ext: no edema Psych: pleasant, normal affect Skin: intact Neuro: Alert and oriented x3 . Assessment & Plan:  Mrs. Shankman is an 82 year old woman who presents for f/u of left sided trigeminal neuralgia and dizziness.    1) Sensory deafferation pain, left sided trigeminal neuralgia vs cluster headaches (she does have lacrimation and runny nose on that side). -discussed her recent visit with Dr. Gloriann Loan -discussed her follow-up with Dr. Lenord Fellers -discussed the negative experience when she last went to [place] -discussed that next available appointment was on June 24th or 26th, but they were able to move this up to May 22nd or 24th -discussed that she keeps a journal with the setting with the level of her pain.  -Continue Diclofenac 3%, Gabapentin 5%, Lidocaine 5%, Menthol 1% compounded at [facility]:  apply 1-2 pumps three to four times per day as needed  -discussed that she has had to turn off the stimulator more since she has established care at [facility] -discussed response to her stimulator -discussed doubling the gabapentin when pain is severe Increase low dose naltrexone to 3mg  HS -discussed her response to the stimulator She does have a history of migraines.  -Discussed that if she has to be in bed all day due to her pain it may be beneficial for her husband to give her at least one hydrocodone per day to enable her to function -provided  refill of Fentanyl patch as she will be leaving for her surgery in [place] soon and her husband plans to stay there for some time in case she has any surgical complications.  -reviewed Dr.  Paulo Fruit note with her, discussed the plan for her visit to [facility] in May -ordered MRI Brain with and without contrast, discussed the plan for repeat MRI brain in May -discussed her follow-up with pain psychology.  -recommended weaning off Fentanyl, discussed stopping fentanyl because she is on 12.5mg  today but she had tried this a couple of weeks ago and pain was so severe that she needed patch to be replaced.  -discussed that she is taking hydrocodone 4 tablets per day, and recommend to decrease this to 3 tabs per day for three days and then to keep decreasing the tab by once per day.  -discussed that skipping the morning dose of hydrocodone will at least allow her to sleep -recommended trying Frankincense and Myrrh essential oils mixed in coconut oil and applied to area of pain -recommended restarting Topamax -[facility] to prescribe compounding gel refill -continue Gabapentin 100mg  up to 8 times a day, discussed her current schedule of takin -continue Fentanyl patch to and she is agreeable. Discussed that phenytoin, carbamazepine -discussed using Oragel multiple times per ay since this helps for an hour, use the topical cream intermittently in between -continue follow-up with [facility] to set up an appointment for her with them. Unfortunately Dr. Clint Lipps has retired but was able to set her up with Dr. Myrla Halsted who specializes in migraines and trigeminal neuralgia on July 25th. Let patient and husband know. They will mail her their address.  -restart 12. fentanyl patch as patient does not feel she can get through weekend with this level of pain -commended her for calling Dr. Lorrine Kin as well to let him know how she is feeling -recommended taking 1 hydrocodone now for the severe pain, and discussed that the Fentanyl patch with take 12-24 hours to kick in -discussed with April the neurology referral and she did specify Dr. Hector Shade and spoke with the [facility]  Neurology department -discussed CBD oil, her son bought her some of this and she felt that it greatly helped  Recommend topical CBD oil- discussed its benefits in reducing inflammation, pain, insomnia, and anxiety, but she stopped because effect wore of.  -Discussed that CBD oil differs from marijuana in that it does not contain THC- the substance that causes euphoria.  -Discussed that it is made from the hemp plant.  -It has been used for thousands of years -preliminary research suggests that if may be able to shrink cancerous tumors, stop plaque formation in Alzheimer's Disease, and slow the progress of brain disease from concussions.  -Additional benefits that have been demonstrated in studies include improved nausea, indigestion, and brain health, and reduced seizures.  -In a survey 92% of patients who tried medical cannabis felt it improved symptoms such as chronic pain, arthritis, migraines, and cancer.   -Provided with a pain relief journal and discussed that it contains foods and lifestyle tips to naturally help to improve pain. Discussed that these lifestyle strategies are also very good for health unlike some medications which can have negative side effects. Discussed that the act of keeping a journal can be therapeutic and helpful to realize patterns what helps to trigger and alleviate pain.   -discussed with Dr. Marcia Brash trigeminal deafferation pain, discussed the difference between this and trigeminal neuralgia with patient and husband, discussed  motor cortex stimulation. -discussed whether or not to continue fentanyl patch, husband does feel that the IV fentanyl  -encouraged following with Dr. Sweet/Dr. Hyacinth Meeker at [facility] to try procedure recommended by D. Ostegard.  -discussed mechanism of action of low dose naltrexone as an opioid receptor antagonist which stimulates your body's production of its own natural endogenous opioids, helping to decrease pain. Discussed that it can also  decrease T cell response and thus be helpful in decreasing inflammation, and symptoms of brain fog, fatigue, anxiety, depression, and allergies. Discussed that this medication needs to be compounded at a compounding pharmacy and can more expensive. Discussed that I usually start at 1mg  and if this is not providing enough relief then I titrate upward on a monthly basis.   -stop lamotrigine as it is making her dizzy without benefit -reviewed patient's medical notes -discussed her progress with getting a referral to [facility] -recommended going to ED given darkening of skin, worsening eye closure, finding of cyst, dizziness to receive prompt neurosurgical eval regarding if surgery is necessary, and also for pain management given worsening pain refractory to all treatments and resulting suicidality. Reviewed ED doc notes- he discussed with on-call neurology and NSGY and determined that patient could be discharged with outpatient follow-up. IV fentanyl was administered with relief to patient. She was discharged on fentanyl patch which has not provided relief. She is getting - no side effects- discussed increasing to and her husband is in agreement. Prior auth completed and approved. Discussed with husband starting patch on Satruday after she has worn patch for 72 hours -encouraged follow-up with Dr. Marcia Brash, husband let me know appointment has been set up 1/18.  -recommended follow-up with Dr. 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  -Discussed that turmeric  -discussed steroid taper. -continue savella, husband feels like it is helping with her suicidal thoughts. Increase to  25mg .  Discontinued hydrocodone  -discuss mouth guard with dentist.  -d/c topamax -she does have another procedure scheduled. -Continue Norco 5mg  TID PRN -follow-up monthly with Riley Lam and with me in February, placed on waitlist for earlier appointment with me, advised phone visits/MyChart are a great way to communicate in the interim -discussed that she can take an additional one or two tylenol for breakthrough pain -recommended checking CMP for liver enzymes with next labs -prescribed NAC 600mg  BID to protect the liver while taking tylenol, discussed its health benefits for mitochondrial function -d/c Baclofen  -f/u with Dr. Marcia Brash and Dr. Lorrine Kin 1/18 -discussed retrying carbamazepine to see if it is effective for her. It is $89 for her, so we discussed trying Lamictal instead.  -encouraged anti-inflammatory diet.  -she had sinusitis that was viral and was treated with antibiotics. This was in the early 90s. The symptoms resolved after 2 months. She uses to get sinus infections and saline rinse.  -She has had multiple root canals.  -Discontinue 100% oxygen via facemask as this was not helpful.  -Discussed that unfortunately Sprint PNS is not indicated for craniofacial pain. -Amitriptyline and Lyrica did not help.  -Continue Cymbalta 60mg .  -Topiramate helped but caused dizziness.  -Provided with hand out with information regarding trigeminal neuralgia -Failed Penytoin.  -trial lidocaine patch -She would like to  try biofeedback. -reviewed neurology note.  -referred to Dr. Hector Shade.  -will try trigger point injections next visit.  -Continue Ketamine 10%, Baclofen 2%, Cyclobenzaprine 2%, Ketoprofen 10%, Gabapentin 6%, Bupivacaine 1%, Amitryptiline 5% to apply to painful areas- using 3-4 times per day.  -Can consider Meloxicam if Phenytoin does not help.  -Discussed Qutenza as an option for neuropathic pain control. Discussed that this is a capsaicin patch, stronger than  capsaicin cream. Discussed that it is currently approved for diabetic peripheral neuropathy and post-herpetic neuralgia, but that it has also shown benefit in treating other forms of neuropathy. Provided patient with link to site to learn more about the patch: https://www.clark.biz/. Discussed that the patch would be placed in office and benefits usually last 3 months. Discussed that unintended exposure to capsaicin can cause severe irritation of eyes, mucous membranes, respiratory tract, and skin, but that Qutenza is a local treatment and does not have the systemic side effects of other nerve medications. Discussed that there may be pain, itching, erythema, and decreased sensory function associated with the application of Qutenza. Side effects usually subside within 1 week. A cold pack of analgesic medications can help with these side effects. Blood pressure can also be increased due to pain associated with administration of the patch.  -she cut down on her gabapentin due to her dizziness    Turmeric to reduce inflammation--can be used in cooking or taken as a supplement.  Benefits of turmeric:  -Highly anti-inflammatory  -Increases antioxidants  -Improves memory, attention, brain disease  -Lowers risk of heart disease  -May help prevent cancer  -Decreases pain  -Alleviates depression  -Delays aging and decreases risk of chronic disease  -Consume with black pepper to increase absorption    Turmeric Milk Recipe:  1 cup milk  1 tsp turmeric  1 tsp cinnamon  1 tsp grated ginger (optional)  Black pepper (boosts the anti-inflammatory properties of turmeric).  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  -she is a good metabolizer of amitriptyline and has tolerated nortrypilline in the past, but without much benefit.    3) CKD: encouraged 6-8 glasses of water per day. She states that she had AKI with Ibuprofen in the past. Discussed checking Cr level next visit if we are considering Meloxicam -Discussed that Nucynta, and other pain medications, may stay in her bloodstream longer given her CKD   4) Hypernatremia: encouraged hydration   5) General health and pain: provided with list of healthy foods that are both nutritious and fight pain.   6) Depressed: Amitriptyline did help with this but she could not tolerate the drowsiness. Encouraged focusing on the factors that she can control, such as anti-inflammatory diet.   7) Fibromyalgia: Discussed goal to wean steroids prior to surgery. Continue 1mg  daily. She is on that intermittently. She started this again about a month ago. She has been on this about a year. Continue Savella -discussed benefits of cold water showers.  -  continue gabapentin.  -high dose vitamin D sent  8) Dizziness: -discussed that this could be from the combination of gabapentin and baclofen.  -discussed that all the medications we are trying for pain may worsen the dizziness  9) Constipation:  -Provided list of following foods that help with constipation and highlighted a few: 1) prunes- contain high amounts of fiber.  2) apples- has a form of dietary fiber called pectin that accelerates stool movement and increases beneficial gut bacteria 3) pears- in addition to fiber, also high in fructose and sorbitol which have laxative effect 4) figs- contain an enzyme ficin  which helps to speed colonic transit 5) kiwis- contain an enzyme actinidin that improves gut motility and reduces constipation 6) oranges- rich in pectin (like apples) 7) grapefruits- contain a flavanol naringenin which has a laxative effect 8) vegetables- rich in fiber and also great sources of folate, vitamin C, and K 9) artichoke- high in inulin, prebiotic great for the microbiome 10) chicory- increases stool frequency and softness (can be added to coffee) 11) rhubarb- laxative effect 12) sweet potato- high fiber 13) beans, peas, and lentils- contain both soluble and insoluble fiber 14) chia seeds- improves intestinal health and gut flora 15) flaxseeds- laxative effect 16) whole grain rye bread- high in fiber 17) oat bran- high in soluble and insoluble fiber 18) kefir- softens stools -recommended to try at least one of these foods every day.  -drink 6-8 glasses of water per day -walk regularly, especially after meals.   10) Osteopenia - was tapered off the prednisone -discussed that currently her pain may be more of a risk to her than osteopenia given her suicidal ideation from her pain -discussed risk of fall with the medications that make her dizziness.   11) Daytime sleepiness -discussed that this is secondary to her medications.  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  -discussed red light therapy  15) HTN: -discussed clonidine patch could potentially help with HTN and pain.  -BP is 170/90 today.  -Advised checking BP daily at home and logging results to bring into follow-up appointment with PCP and myself. -Reviewed BP meds today.  -Advised regarding healthy foods that can help lower blood pressure and provided with a list: 1) citrus foods- high in vitamins and  minerals 2) salmon and other fatty fish - reduces inflammation and oxylipins 3) swiss chard (leafy green)- high level of nitrates 4) pumpkin seeds- one of the best natural sources of magnesium 5) Beans and lentils- high in fiber, magnesium, and potassium 6) Berries- high in flavonoids 7) Amaranth (whole grain, can be cooked similarly to rice and oats)- high in magnesium and fiber 8) Pistachios- even more effective at reducing BP than other nuts 9) Carrots- high in phenolic compounds that relax blood vessels and reduce inflammation 10) Celery- contain phthalides that relax tissues of arterial walls 11) Tomatoes- can also improve cholesterol and reduce risk of heart disease 12) Broccoli- good source of magnesium, calcium, and potassium 13) Greek yogurt: high in potassium and calcium 14) Herbs and spices: Celery seed, cilantro, saffron, lemongrass, black cumin, ginseng, cinnamon, cardamom, sweet basil, and ginger 15) Chia and flax seeds- also help to lower cholesterol and blood sugar 16) Beets- high levels of nitrates that relax blood vessels  17) spinach and bananas- high in potassium  -Provided lise  of supplements that can help with hypertension:  1) magnesium: one high quality brand is Bioptemizers since it contains all 7 types of magnesium, otherwise over the counter magnesium gluconate 400mg  is a good option 2) B vitamins 3) vitamin D 4) potassium 5) CoQ10 6) L-arginine 7) Vitamin C 8) Beetroot -Educated that goal BP is 120/80. -Made goal to incorporate some of the above foods into diet.    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  -discussed that she had weaned off the steroids and had been doing fine off the steroids -discussed that aquatic therapy can help -discussed that she should encourage follow-up with rheumatology to assess for giant cell arteritis -[facility]  16. Sciatica: -consider red light  therapy -discussed aquatherapy -discussed that the spinal injection helped temporarily -discussed that she has been taking 4mg  of low dose naltrexone -discussed that she used some hydrocodone she had left over from the past.  -discussed lumbosacral orthosis, advised using for activities that involve bending/twisting/walking on uneven terrain -discussed that it would probably be better to repeat steroid injection than to continue hydrocodone -advised to use lidocaine patch -continue capsaicin cream  >40 minutes spent in discussion of her brain stimulator, that she is happy she got it done, discussed that she had 4 our 4 surgeries previously, discussed the care she is receiving at [facility], discussed that sometimes the stimualtor makes things worse and makes her nose run and makes it hard for her to see, discussed her sciatica, that she has needed to use some hydrocodone, discussed that it is hard for her to wake up in the morning, discussed that she sometimes she wakes at 10:30 and that she sleeps well at night, discussed that she seems to be tolerating the gabapentin better and that she takes 2 tablets in the morning and at night, discussed that she has to wake up several times in the night, discussed benefits of the steroid injection and advised to repeat this.

## 2023-02-23 NOTE — Addendum Note (Signed)
Addended by: Horton Chin on: 02/23/2023 12:02 PM   Modules accepted: Orders

## 2023-02-23 NOTE — Progress Notes (Deleted)
Subjective:    Patient ID: [name], female    DOB: [birth year], 82 y.o.   MRN: [id number]  HPI   Pain Inventory Average Pain {NUMBERS; 0-10:5044} Pain Right Now {NUMBERS; 0-10:5044} My pain is {PAIN DESCRIPTION:21022940}  In the last 24 hours, has pain interfered with the following? General activity {NUMBERS; 0-10:5044} Relation with others {NUMBERS; 0-10:5044} Enjoyment of life {NUMBERS; 0-10:5044} What TIME of day is your pain at its worst? {time of day:24191} Sleep (in general) {BHH GOOD/FAIR/POOR:22877}  Pain is worse with: {ACTIVITIES:21022942} Pain improves with: {PAIN IMPROVES HQIO:96295284} Relief from Meds: {NUMBERS; 0-10:5044}  Family History  Problem Relation Age of Onset   Heart disease Mother    Heart disease Father    Stroke Father    Stroke Sister    Asthma Daughter    Colon cancer Neg Hx    Social History   Socioeconomic History   Marital status: Married    Spouse name: Not on file   Number of children: 2   Years of education: college   Highest education level: Not on file  Occupational History   Occupation: housewife    Employer: RETIRED  Tobacco Use   Smoking status: Never   Smokeless tobacco: Never  Vaping Use   Vaping status: Never Used  Substance and Sexual Activity   Alcohol use: No    Alcohol/week: 0.0 standard drinks of alcohol   Drug use: No   Sexual activity: Not on file  Other Topics Concern   Not on file  Social History Narrative   1 cup of Jasmine tea daily    Social Determinants of Health   Financial Resource Strain: Low Risk  (12/31/2022)   Received from [facility]   Overall Financial Resource Strain (CARDIA)    Difficulty of Paying Living Expenses: Not hard at all  Food Insecurity: No Food Insecurity (12/31/2022)   Received from [facility]   Hunger Vital Sign    Worried About Running Out of Food in the Last Year: Never true    Ran Out of Food in the Last Year: Never true   Transportation Needs: No Transportation Needs (12/31/2022)   Received from [facility] - Transportation    In the past 12 months, has lack of transportation kept you from medical appointments or from getting medications?: No    Lack of Transportation (Non-Medical): No  Physical Activity: Inactive (11/13/2019)   Received from [facility] visits prior to 08/22/2022., [facility] visits prior to 08/22/2022.   Exercise Vital Sign    Days of Exercise per Week: 0 days    Minutes of Exercise per Session: 0 min  Stress: No Stress Concern Present (11/13/2019)   Received from [facility] visits prior to 08/22/2022., [facility] visits prior to 08/22/2022.   [employer] of Occupational Health - Occupational Stress Questionnaire    Feeling of Stress : Not at all  Social Connections: Socially Integrated (11/13/2019)   Received from [facility] visits prior to 08/22/2022., [facility] visits prior to 08/22/2022.   Social Connection and Isolation Panel [NHANES]    Frequency of Communication with Friends and Family: More than three times a week    Frequency of Social Gatherings with Friends and Family: More than three times a week    Attends Religious Services: 1 to 4 times per year  Active Member of Clubs or Organizations: Yes    Attends Club or Organization Meetings: 1 to 4 times per year    Marital Status: Married   Past Surgical History:  Procedure Laterality Date   CHOLECYSTECTOMY  03/2010   COLONOSCOPY  2009   Dr. Harvie Junior    CYSTOSCOPY     Multiple Cystoscopies with stents or biopsy    ESOPHAGOGASTRODUODENOSCOPY  10/2013   EYE SURGERY     bilateral cataract removal   Gamma knife radiation  01/2018   for trigeminal neuralgia   REFRACTIVE SURGERY Bilateral 2022   RHIZOTOMY Left 04/28/2021   Procedure: Left Percutaneous trigeminal  balloon rhizotomy;  Surgeon: Jadene Pierini, MD;  Location: [facility] OR;  Service: Neurosurgery;  Laterality: Left;   TONSILLECTOMY     Past Surgical History:  Procedure Laterality Date   CHOLECYSTECTOMY  03/2010   COLONOSCOPY  2009   Dr. Harvie Junior    CYSTOSCOPY     Multiple Cystoscopies with stents or biopsy    ESOPHAGOGASTRODUODENOSCOPY  10/2013   EYE SURGERY     bilateral cataract removal   Gamma knife radiation  01/2018   for trigeminal neuralgia   REFRACTIVE SURGERY Bilateral 2022   RHIZOTOMY Left 04/28/2021   Procedure: Left Percutaneous trigeminal balloon rhizotomy;  Surgeon: Jadene Pierini, MD;  Location: [facility] OR;  Service: Neurosurgery;  Laterality: Left;   TONSILLECTOMY     Past Medical History:  Diagnosis Date   Adenomatous colon polyp 03/24/2018   Allergy    Arthritis    Cataract    Chronic headaches    Depression    Fibromyalgia    Gallstones    GERD (gastroesophageal reflux disease)    Hyperlipemia    Hypertension    Interstitial cystitis    Lactose intolerance    LBBB (left bundle branch block)    Osteoarthritis    Osteopenia    Pneumonia    Thyroid disease    Tinnitus    Trigeminal neuralgia    There were no vitals taken for this visit.  Opioid Risk Score:   Fall Risk Score:  `1  Depression screen PHQ 2/9     12/28/2022    1:35 PM 10/27/2022    2:05 PM 09/15/2022    1:25 PM 07/21/2022    1:38 PM 05/19/2022    1:11 PM 02/03/2022    1:29 PM 12/25/2021    2:01 PM  Depression screen PHQ 2/9  Decreased Interest 0 0 0 1 0 0 0  Down, Depressed, Hopeless 0 0 0 1 0 0 3  PHQ - 2 Score 0 0 0 2 0 0 3  Altered sleeping       0  Tired, decreased energy       0  Change in appetite       0  Feeling bad or failure about yourself        0  Trouble concentrating       0  Moving slowly or fidgety/restless       0  Suicidal thoughts       0  PHQ-9 Score       3  Difficult doing work/chores       Somewhat difficult    Review of Systems      Objective:   Physical Exam        Assessment & Plan:

## 2023-02-23 NOTE — Patient Instructions (Signed)
[name]

## 2023-03-01 ENCOUNTER — Telehealth: Payer: Self-pay

## 2023-03-01 ENCOUNTER — Ambulatory Visit (INDEPENDENT_AMBULATORY_CARE_PROVIDER_SITE_OTHER): Payer: Medicare Other | Admitting: Orthopedic Surgery

## 2023-03-01 DIAGNOSIS — M170 Bilateral primary osteoarthritis of knee: Secondary | ICD-10-CM | POA: Diagnosis not present

## 2023-03-01 NOTE — Telephone Encounter (Signed)
Auth needed for bilat knee gel in 6 months

## 2023-03-02 ENCOUNTER — Ambulatory Visit (HOSPITAL_BASED_OUTPATIENT_CLINIC_OR_DEPARTMENT_OTHER): Payer: Medicare Other | Admitting: Physical Therapy

## 2023-03-02 DIAGNOSIS — D225 Melanocytic nevi of trunk: Secondary | ICD-10-CM | POA: Diagnosis not present

## 2023-03-02 DIAGNOSIS — D2262 Melanocytic nevi of left upper limb, including shoulder: Secondary | ICD-10-CM | POA: Diagnosis not present

## 2023-03-02 DIAGNOSIS — L821 Other seborrheic keratosis: Secondary | ICD-10-CM | POA: Diagnosis not present

## 2023-03-02 DIAGNOSIS — Z85828 Personal history of other malignant neoplasm of skin: Secondary | ICD-10-CM | POA: Diagnosis not present

## 2023-03-02 DIAGNOSIS — D692 Other nonthrombocytopenic purpura: Secondary | ICD-10-CM | POA: Diagnosis not present

## 2023-03-02 DIAGNOSIS — D1801 Hemangioma of skin and subcutaneous tissue: Secondary | ICD-10-CM | POA: Diagnosis not present

## 2023-03-02 DIAGNOSIS — D2261 Melanocytic nevi of right upper limb, including shoulder: Secondary | ICD-10-CM | POA: Diagnosis not present

## 2023-03-02 NOTE — Progress Notes (Signed)
Procedure Note  Patient: [name]             Date of Birth: Nov 05, 1940           MRN: [id number]             Visit Date: 03/01/2023  Procedures: Visit Diagnoses:  1. Primary osteoarthritis of both knees     Large Joint Inj: bilateral knee on 03/01/2023 7:35 AM Indications: diagnostic evaluation, joint swelling and pain Details: 18 G 1.5 in needle, superolateral approach  Arthrogram: No  Medications (Right): 5 mL lidocaine 1 %; 88 mg Hyaluronan 88 MG/4ML Medications (Left): 5 mL lidocaine 1 %; 88 mg Hyaluronan 88 MG/4ML Outcome: tolerated well, no immediate complications Procedure, treatment alternatives, risks and benefits explained, specific risks discussed. Consent was given by the patient. Immediately prior to procedure a time out was called to verify the correct patient, procedure, equipment, support staff and site/side marked as required. Patient was prepped and draped in the usual sterile fashion.    Lot #[id number]

## 2023-03-03 NOTE — Telephone Encounter (Signed)
Noted. Next available gel injection would need to be after 08/29/2023 Will submit in February, 2025.

## 2023-03-04 DIAGNOSIS — G509 Disorder of trigeminal nerve, unspecified: Secondary | ICD-10-CM | POA: Diagnosis not present

## 2023-03-05 ENCOUNTER — Encounter: Payer: Self-pay | Admitting: Orthopedic Surgery

## 2023-03-05 MED ORDER — LIDOCAINE HCL 1 % IJ SOLN
5.0000 mL | INTRAMUSCULAR | Status: AC | PRN
Start: 2023-03-01 — End: 2023-03-01
  Administered 2023-03-01: 5 mL

## 2023-03-05 MED ORDER — HYALURONAN 88 MG/4ML IX SOSY
88.0000 mg | PREFILLED_SYRINGE | INTRA_ARTICULAR | Status: AC | PRN
Start: 2023-03-01 — End: 2023-03-01
  Administered 2023-03-01: 88 mg via INTRA_ARTICULAR

## 2023-03-08 ENCOUNTER — Telehealth: Payer: Self-pay

## 2023-03-08 ENCOUNTER — Other Ambulatory Visit: Payer: Self-pay | Admitting: Sports Medicine

## 2023-03-08 DIAGNOSIS — M5416 Radiculopathy, lumbar region: Secondary | ICD-10-CM

## 2023-03-08 NOTE — Telephone Encounter (Signed)
Order placed

## 2023-03-09 NOTE — Discharge Instructions (Signed)

## 2023-03-10 ENCOUNTER — Ambulatory Visit
Admission: RE | Admit: 2023-03-10 | Discharge: 2023-03-10 | Disposition: A | Discharge status: Home / Self Care | Payer: Medicare Other | Source: Ambulatory Visit | Attending: Sports Medicine | Admitting: Sports Medicine

## 2023-03-10 DIAGNOSIS — M4727 Other spondylosis with radiculopathy, lumbosacral region: Secondary | ICD-10-CM | POA: Diagnosis not present

## 2023-03-10 DIAGNOSIS — M5416 Radiculopathy, lumbar region: Secondary | ICD-10-CM

## 2023-03-10 MED ORDER — IOPAMIDOL (ISOVUE-M 200) INJECTION 41%
1.0000 mL | Freq: Once | INTRAMUSCULAR | Status: AC
  Administered 2023-03-10: 1 mL via EPIDURAL

## 2023-03-10 MED ORDER — METHYLPREDNISOLONE ACETATE 40 MG/ML INJ SUSP (RADIOLOG
80.0000 mg | Freq: Once | INTRAMUSCULAR | Status: AC
  Administered 2023-03-10: 80 mg via EPIDURAL

## 2023-03-12 DIAGNOSIS — M810 Age-related osteoporosis without current pathological fracture: Secondary | ICD-10-CM | POA: Diagnosis not present

## 2023-03-12 DIAGNOSIS — E039 Hypothyroidism, unspecified: Secondary | ICD-10-CM | POA: Diagnosis not present

## 2023-03-12 DIAGNOSIS — I10 Essential (primary) hypertension: Secondary | ICD-10-CM | POA: Diagnosis not present

## 2023-03-15 DIAGNOSIS — M810 Age-related osteoporosis without current pathological fracture: Secondary | ICD-10-CM | POA: Diagnosis not present

## 2023-03-17 DIAGNOSIS — F339 Major depressive disorder, recurrent, unspecified: Secondary | ICD-10-CM | POA: Diagnosis not present

## 2023-03-17 DIAGNOSIS — G629 Polyneuropathy, unspecified: Secondary | ICD-10-CM | POA: Diagnosis not present

## 2023-03-17 DIAGNOSIS — I2510 Atherosclerotic heart disease of native coronary artery without angina pectoris: Secondary | ICD-10-CM | POA: Diagnosis not present

## 2023-03-17 DIAGNOSIS — I10 Essential (primary) hypertension: Secondary | ICD-10-CM | POA: Diagnosis not present

## 2023-03-17 DIAGNOSIS — N1831 Chronic kidney disease, stage 3a: Secondary | ICD-10-CM | POA: Diagnosis not present

## 2023-03-17 DIAGNOSIS — K219 Gastro-esophageal reflux disease without esophagitis: Secondary | ICD-10-CM | POA: Diagnosis not present

## 2023-03-17 DIAGNOSIS — M353 Polymyalgia rheumatica: Secondary | ICD-10-CM | POA: Diagnosis not present

## 2023-03-17 DIAGNOSIS — M797 Fibromyalgia: Secondary | ICD-10-CM | POA: Diagnosis not present

## 2023-03-17 DIAGNOSIS — Z0000 Encounter for general adult medical examination without abnormal findings: Secondary | ICD-10-CM | POA: Diagnosis not present

## 2023-03-17 DIAGNOSIS — E039 Hypothyroidism, unspecified: Secondary | ICD-10-CM | POA: Diagnosis not present

## 2023-03-17 DIAGNOSIS — G500 Trigeminal neuralgia: Secondary | ICD-10-CM | POA: Diagnosis not present

## 2023-03-17 DIAGNOSIS — M810 Age-related osteoporosis without current pathological fracture: Secondary | ICD-10-CM | POA: Diagnosis not present

## 2023-03-18 DIAGNOSIS — G500 Trigeminal neuralgia: Secondary | ICD-10-CM | POA: Diagnosis not present

## 2023-03-23 DIAGNOSIS — Z23 Encounter for immunization: Secondary | ICD-10-CM | POA: Diagnosis not present

## 2023-03-30 DIAGNOSIS — H353123 Nonexudative age-related macular degeneration, left eye, advanced atrophic without subfoveal involvement: Secondary | ICD-10-CM | POA: Diagnosis not present

## 2023-04-06 DIAGNOSIS — G509 Disorder of trigeminal nerve, unspecified: Secondary | ICD-10-CM | POA: Diagnosis not present

## 2023-05-07 ENCOUNTER — Encounter: Payer: Self-pay | Admitting: Physical Medicine and Rehabilitation

## 2023-05-10 DIAGNOSIS — G509 Disorder of trigeminal nerve, unspecified: Secondary | ICD-10-CM | POA: Diagnosis not present

## 2023-05-11 DIAGNOSIS — H353123 Nonexudative age-related macular degeneration, left eye, advanced atrophic without subfoveal involvement: Secondary | ICD-10-CM | POA: Diagnosis not present

## 2023-05-12 ENCOUNTER — Other Ambulatory Visit: Payer: Self-pay | Admitting: Physical Medicine and Rehabilitation

## 2023-05-12 MED ORDER — HYDROCODONE-ACETAMINOPHEN 5-325 MG PO TABS: 1.0000 | ORAL_TABLET | Freq: Two times a day (BID) | ORAL | 0 refills | Status: DC | PRN

## 2023-05-25 ENCOUNTER — Encounter
Payer: Medicare Other | Attending: Physical Medicine and Rehabilitation | Admitting: Physical Medicine and Rehabilitation

## 2023-05-25 ENCOUNTER — Encounter: Payer: Self-pay | Admitting: Physical Medicine and Rehabilitation

## 2023-05-25 VITALS — BP 138/76 | HR 69 | Ht 62.5 in | Wt 143.4 lb

## 2023-05-25 DIAGNOSIS — R42 Dizziness and giddiness: Secondary | ICD-10-CM | POA: Diagnosis not present

## 2023-05-25 DIAGNOSIS — F32A Depression, unspecified: Secondary | ICD-10-CM | POA: Insufficient documentation

## 2023-05-25 DIAGNOSIS — Z6379 Other stressful life events affecting family and household: Secondary | ICD-10-CM | POA: Diagnosis not present

## 2023-05-25 DIAGNOSIS — M5430 Sciatica, unspecified side: Secondary | ICD-10-CM | POA: Insufficient documentation

## 2023-05-25 DIAGNOSIS — G500 Trigeminal neuralgia: Secondary | ICD-10-CM | POA: Diagnosis not present

## 2023-05-25 DIAGNOSIS — M353 Polymyalgia rheumatica: Secondary | ICD-10-CM | POA: Insufficient documentation

## 2023-05-25 MED ORDER — HYDROCODONE-ACETAMINOPHEN 5-325 MG PO TABS: 1.0000 | ORAL_TABLET | Freq: Two times a day (BID) | ORAL | 0 refills | Status: DC | PRN

## 2023-05-25 NOTE — Progress Notes (Signed)
Subjective:    Patient ID: [name], female    DOB: 1940-08-22, 82 y.o.   MRN: [id number]  HPI  [name] presents today for f/u of severe trigeminal neuralgia.   1) Severe trigeminal neuralgia: -today pain is 8/10 -she is very happy with her new [facility] but has not found any helpful settings yet -she turns off the stimulator at night as she can sleep well at night -if she turns off the stimulator during the day she has 1 hour of relief and then the old pain returns -the last two or three days she has had bad pain -she is filling out pain diaries -she was able to cook Thanksgiving dinner but felt a lot of pain the next day -she is doing ok -she likes her new physician at [facility]. He works a lot with the [employer] rep because the rep is just for back pain -he takes pictures and asks for the location to be adjusted -at [facility] she is just given one program and then it stops working.  -Saw Dr. Gloriann Loan 8 weeks ago and the new programs worked but then it stops working when she returns home -she is seeing neurosurgeon Dr. Lenord Fellers on Thursday  [name] researched red light therapy -she went to see the doctor in [place] and was only been able to spend 5 minutes with her -they did about 15 minutes of adjustments and the new programs seemed to hurt more than they help -has had stimulator turned off since last night -was able to enjoy her birthday -she did well for 3 weeks but recently pain has been around 8-10 -when pain is very severe it makes her eye hurt -the way her stimulator is set it makes her nose hurt She is going back to [facility] on April 10th -she is managing with the gabapentin and had to take 2 extra yesterday and it did not make her sleepier  -pain fluctuates  -she went to [place] last week with her children.  -she keeps a journal of when she changes the program and what her pain level is in her teeth, eyes, and cheek. She put in not to use the teeth program -she said if  they could hit the nerve between the two teeth that would be helpful -they have her an 11 and 12 in that region,  -she cannot get the right level of stimulation. Before she left she decreased it by 50%. Now she is on a cheat program and the lowest amount. It is helping the teeth but hurting the eye.  -Dr. Gloriann Loan had a baby on 11/14 and she is not coming back until January.  -she talked to the representative -last night she turned off the stimulator -she slept almost day today and did not want to wake up for supper which was rare for her -she is having a difficult time weaning off the hydrocodone.  -she has been off of it in two days -she is expecting a return call from Dr. Paulo Fruit team soon to discuss wean off fentanyl -drove up on Tuesday and on Wednesday was supposed to have an appointment with pain psychologist. When they got there they were told that appointment had been changed to virtual. The next day they had a more sophisticated MRI. [name] did moving in MRI so MRI was not as helpful. The next day they saw Dr. Gloriann Loan and discussed several procedural options and then went back to the motor stimulator- that is scheduled on October 16th.  -  she recently went on vacation and had to spend the majority of the vacation in bed due to the severity of her pain -she stopped using the intranasal ketamine because although it helped initially, it appeared to make her pain worse later -she asks if she can wean off some medication as she had a recent appointment with Dr. Gloriann Loan and was told she may have a lot of postoperative pain if she is taking so much pain medication. She would still like to do this and also experiences sedation from her medication. She asks how she would go about weaning her medications -she asks if she should wean off the gabapentin since this makes her so sleepy She asks if she should restart Topamax. -she was encouraged by her phone visit with Dr. Gloriann Loan and plans to pursue surgery. She is  going to [facility] next week for Brain MRI, neuropsych eval, and to discuss the risks and benefits of the procedure with Dr. Gloriann Loan -pain was so severe in the past that she expressed suicidality to her husband [name].  -she feels that none of her medications are helpful but she knows that when she tries to wean them her pain worsens.  -she asks about refill of the compounding cream -her husband asks about referral to ketamine clinic for depression rather than pain -she went back to the dentist because it was urting between two teeth and he said there is nothing wrong with those teeth and it is the trigeminal nerve When she took a shower that usually seems to help with but was migrating around her face.  -appointment with neurology in July -Dr. Gloriann Loan cancelled the zoom call in April but this was rescheduled to July.  -she finds that only the gabapentin helps -she is not sure if the fentanyl helps When she is feeling really bad she tries to take another one but she gets very sleepy and dizzy.  -she is taking 8 a day of 100mg  per day- she takes two gabapentin and tylenol and hydrocodone in the morning. She takes another two at breakfast around 11, another 2 at lunch around 1:30pm.  -she had a recent root canal and pain has become much more severe after that -her pain is in the two teeth next to where she had the root canal -Oragel heps for a bout an hour.  -she tried increasing her Gabapentin to 300mg  TID but she could not tolerate this. It made her too sleepy.  -she contacted Dr. Lorrine Kin and he recommended she pause wean of Fentanyl patch and I agree with this -she took last patch last night. She still has 12.45mcg patch available -her husband asks whether she should take a higher dose of gabapentin -the fentanyl patch makes her very sleepy and so allows her to sleep. It has also made her dizzier.  -the fentanyl patch did not provide more relief- she would like to wean off. Weaned to 25, then  12.21mcg, then off this week -referral was to [facility] and not to Dr. Hector Shade she states and they need a new referral send to Dr. Clint Lipps -she is using the compound cream and this helps- she uses this twice per day -capsaicin made it burn  -lamotrigine makes her too sleepy and dizzy to come down the stairs by herself and she does not feel comfortable going to the shower -left eye more open than usual. -she does not feel she is getting relief from the fentanyl patch -she feels that the procedure at  [facility] is a last resort.  -Given darkening of skin and worsening eye lid closure, MRI brain stat obtained and shows new cyst that could be post-surgical. Recommended she go to ED given worsening dizziness and facial swelling as well. ED physician discussed case with NSGY and neurology and discharged home recommending follow-up with Dr. Belva Bertin and Dr. Molli Hazard. She was given IV Fentanyl in the ED which helped and discharged on Fentanyl patch, which has not helped.  -discussed increasing Fentanyl patch to since she has not experienced any negative side effects, no respiratory depression, and her husband is agreeable. I have sent in new script to start Saturday, but insurance required prior auth, which was approved.  -tonight would be the third tay of the Fentanyl patch -she is still taking the Gabapentin -she feels that when she lays down she is spinning.  -eye continues to appear more closed.  -Husband has called Dr. Kathlene Cote and Dr. Barb Merino office to scheduled follow-up appointments and has appointments with both tomorrow.  -Will run out of Fentanyl patch by Sunday -pain is worsening and nothing is providing relief -she asks whether she should go to ED -she has also noted dizziness and is not sure if this is from being in the MRI or from the [place].  -morphine was not effective and caused itching, sarna lotion did help wit this.  -hydrocodone has been most  effective thus far -nucynta was not effective but she is not sure whether she gave it a fair trial -she has month appointments scheduled with Riley Lam and f/u with me in Feb -she used the Norco last night and this morning with good benefit and has started to feel the pain return this afternoon -she did find benefit from the compound cream- she continues to use this.  -She continues to have a sharp shooting pain doing around her eyelid and the tip teeth and in the top of her head.  -has a radiofrequency ablation treatment that helped but not completely -she is not having as much sharp knife-like pin like in her eye Her top teeth in her left law have been hurting really badly.  -she now takes the baclofen with the gabapentin and they seem to work well together.  -she had surgical procedure and feels worse -yesterday she took a half a tablet but this morning she took a whole one. She does fine with the hydrocodone but a whole one makes her too sleepy.  -she is taking 2 100mg  Gabapentin three times per day but it makes her dizzy and sleepy so she cannot funciton -the cold worsens her pain.  -The IV fentanyl did help with the pain.  -her husband feels she should get one more dose of the 25mg  fentanyl. -she did get one week benefit from the steroid injection.  -she is doing terribly -she is miserable -she feels depressed due to the pain -she has considered taking the whole bottle of hydrocodone because because she can't take this pain anymore.  -she started taking the Gabapentin 300mg  TID.  -she does find relief from the hydrocodone -the pain is constant -she wonders if it is inflammation.  -she has not tried turmeric. Her son-in-law gave her a bottle and she couldn't stomach it.   2) Fibromyalgia: -She has been almost nonfunctional due to the severity of her pain. It continues to be very severe.  -She would like to wean off the Gabapentin as she feels at risk of falls -she is taking Topamax  BID. She felt this was helpful but made her unsteady on her feet.  -still using gabapentin and this is helping.  -she feels so fatigued that she can only have a shower every other day.   3) Osteoporosis: -husband asks whether she should get a Prolia shot.   4) Dizziness: -head was swimming -she felt like she was going to fall.  -worsening -feels like she is spinning, not the room  -She tried the Phenytoin for 2 days and did not feel benefit so stopped it.   -she feels the gabapentin aggravates this and she would love to wean off but knows it helps her.   -She feels very sleepy during the day.   -She has never tried Topirmate before.   5) Sore throat -developed stopping lamotrigine since not helping.   Prior history:  Her pain continues to be in her left eye, forehead. She has not tried any topicals for pain. She uses capsaicin for her back and knees.   Since last visit nothing we have tried has helped with her pain. She has tried to decrease her Gabapentin dose but then her pain increased so she has resumed taking 4 Gabapentin per day.   Her husband asks whether he should contact her surgeon regarding whether nerve may have become unclamped.   Discussed prolotherapy, trigger point injections, Baclofen, Clonazepam, Phenytoin as other options we could try,=.  Pain continues to be excruciating and she states she is not sure how much longer she can live like this.   Prior history: She has having some nausea, lightheadedness and felt this may be related to her Gabapentin. She did not take any yesterday and then felt more sever pain in the evening. She also gets electrical shooting across her jaw.   She does not feel that she has cluster headaches as the pain has been so constant. She feels that she is getting worse. The oxygen made her feel relaxed but it is not helping.   The Sarna lotion does help with the itching in her head.   She was willing to try the Amitriptyline but it  makes her too sleepy in the morning.   Her teeth and left eye incredibly hurt.   She feels that the Gabapentin has been making her dizziness.   She asks about biofeedback.   The pain is 7/10 on average, similar to last time.   When the pain is severe it can make her depressed.    6) Macular degeneration:  Has been following with ophthalmology  7) Family illness: -she lost two nephews recently  Pain Inventory Average pain 8 Pain Right Now 18 My pain is constant, sharp, burning, dull, stabbing, and aching  In the last 24 hours, has pain interfered with the following? General activity 9 Relation with others 9 Enjoyment of life 10  What TIME of day is your pain at its worst? morning  and daytime Sleep (in general) Good  Pain is worst with  bending & standing, walking, sitting, inactivity, some activities  Pain improves with rest, heat, medication, therapy, injections  Relief with medication is 1   Family History  Problem Relation Age of Onset   Heart disease Mother    Heart disease Father    Stroke Father    Stroke Sister    Asthma Daughter    Colon cancer Neg Hx    Social History   Socioeconomic History   Marital status: Married    Spouse name: Not on file  Number of children: 2   Years of education: college   Highest education level: Not on file  Occupational History   Occupation: housewife    Employer: RETIRED  Tobacco Use   Smoking status: Never   Smokeless tobacco: Never  Vaping Use   Vaping status: Never Used  Substance and Sexual Activity   Alcohol use: No    Alcohol/week: 0.0 standard drinks of alcohol   Drug use: No   Sexual activity: Not on file  Other Topics Concern   Not on file  Social History Narrative   1 cup of Jasmine tea daily    Social Determinants of Health   Financial Resource Strain: Low Risk  (05/08/2023)   [facility]   Overall Financial Resource Strain (CARDIA)    Difficulty of Paying  Living Expenses: Not hard at all  Food Insecurity: No Food Insecurity (05/08/2023)   Received from [facility]   Hunger Vital Sign    Worried About Running Out of Food in the Last Year: Never true    Ran Out of Food in the Last Year: Never true  Transportation Needs: No Transportation Needs (05/08/2023)   Received from [facility] - Transportation    In the past 12 months, has lack of transportation kept you from medical appointments or from getting medications?: No    Lack of Transportation (Non-Medical): No  Physical Activity: Inactive (11/13/2019)   Received from [facility] visits prior to 08/22/2022., [facility] visits prior to 08/22/2022.   Exercise Vital Sign    Days of Exercise per Week: 0 days    Minutes of Exercise per Session: 0 min  Stress: No Stress Concern Present (11/13/2019)   Received from [facility] visits prior to 08/22/2022., [facility] visits prior to 08/22/2022.   [employer] of Occupational Health - Occupational Stress Questionnaire    Feeling of Stress : Not at all  Social Connections: Socially Integrated (11/13/2019)   Received from [facility] visits prior to 08/22/2022., [facility] visits prior to 08/22/2022.   Social Connection and Isolation Panel [NHANES]    Frequency of Communication with Friends and Family: More than three times a week    Frequency of Social Gatherings with Friends and Family: More than three times a week    Attends Religious Services: 1 to 4 times per year    Active Member of [employer] or Organizations: Yes    Attends Banker Meetings: 1 to 4 times per year    Marital Status: Married   Past Surgical History:  Procedure Laterality Date   CHOLECYSTECTOMY  03/2010   COLONOSCOPY  2009   Dr. Harvie Junior    CYSTOSCOPY     Multiple Cystoscopies with stents  or biopsy    ESOPHAGOGASTRODUODENOSCOPY  10/2013   EYE SURGERY     bilateral cataract removal   Gamma knife radiation  01/2018   for trigeminal neuralgia   REFRACTIVE SURGERY Bilateral 2022   RHIZOTOMY Left 04/28/2021   Procedure: Left Percutaneous trigeminal balloon rhizotomy;  Surgeon: Jadene Pierini, MD;  Location: [facility] OR;  Service: Neurosurgery;  Laterality: Left;   TONSILLECTOMY     Past Surgical History:  Procedure Laterality Date   CHOLECYSTECTOMY  03/2010   COLONOSCOPY  2009   Dr. Harvie Junior    CYSTOSCOPY     Multiple Cystoscopies with  stents or biopsy    ESOPHAGOGASTRODUODENOSCOPY  10/2013   EYE SURGERY     bilateral cataract removal   Gamma knife radiation  01/2018   for trigeminal neuralgia   REFRACTIVE SURGERY Bilateral 2022   RHIZOTOMY Left 04/28/2021   Procedure: Left Percutaneous trigeminal balloon rhizotomy;  Surgeon: Jadene Pierini, MD;  Location: [facility] OR;  Service: Neurosurgery;  Laterality: Left;   TONSILLECTOMY     Past Medical History:  Diagnosis Date   Adenomatous colon polyp 03/24/2018   Allergy    Arthritis    Cataract    Chronic headaches    Depression    Fibromyalgia    Gallstones    GERD (gastroesophageal reflux disease)    Hyperlipemia    Hypertension    Interstitial cystitis    Lactose intolerance    LBBB (left bundle branch block)    Osteoarthritis    Osteopenia    Pneumonia    Thyroid disease    Tinnitus    Trigeminal neuralgia    There were no vitals taken for this visit.  Opioid Risk Score:   Fall Risk Score:  `1  Depression screen [facility] 2/9     02/23/2023   11:21 AM 12/28/2022    1:35 PM 10/27/2022    2:05 PM 09/15/2022    1:25 PM 07/21/2022    1:38 PM 05/19/2022    1:11 PM 02/03/2022    1:29 PM  Depression screen PHQ 2/9  Decreased Interest 0 0 0 0 1 0 0  Down, Depressed, Hopeless 0 0 0 0 1 0 0  PHQ - 2 Score 0 0 0 0 2 0 0    Review of Systems  Constitutional: Negative.   HENT: Negative.    Eyes:  Positive  for visual disturbance.  Respiratory: Negative.    Cardiovascular: Negative.   Gastrointestinal: Negative.   Endocrine: Negative.   Genitourinary: Negative.   Musculoskeletal: Negative.        Pain in left side of face  Skin: Negative.   Allergic/Immunologic: Negative.   Neurological:  Positive for dizziness, tremors, light-headedness and numbness.       Tingling   Hematological: Negative.   Psychiatric/Behavioral: Negative.    All other systems reviewed and are negative.      Objective:   Physical Exam Gen: no distress, normal appearing, BP 149/75. Weight 144lbs, BMI 25.92 Gen: no distress, normal appearing HEENT: oral mucosa pink and moist, NCAT Cardio: Reg rate Chest: normal effort, normal rate of breathing Abd: soft, non-distended Ext: no edema Psych: pleasant, normal affect Skin: intact Neuro: Alert and oriented x3 . Assessment & Plan:  Mrs. Morishita is an 82 year old woman who presents for f/u of left sided trigeminal neuralgia and dizziness.    1) Sensory deafferation pain, left sided trigeminal neuralgia vs cluster headaches (she does have lacrimation and runny nose on that side). -discussed that she has been happy with her new provider at [facility] -d/c narcan and naltrexone, discussed that she uses the hydrocodone sparingly.   -discussed mechanism of action of low dose naltrexone as an opioid receptor antagonist which stimulates your body's production of its own natural endogenous opioids, helping to decrease pain. Discussed that it can also decrease T cell response and thus be helpful in decreasing inflammation, and symptoms of brain fog, fatigue, anxiety, depression, and allergies. Discussed that this medication needs to be compounded at a compounding pharmacy and can more expensive. Discussed that I usually start at 1mg  and if this is  not providing enough relief then I titrate upward on a monthly basis.    -discussed that savella caused hallucinations -discussed that  we tried lyrica and we cannot recall but she had some side effect -discussed that topamax did not help and made her feel worse -discussed that carbamazpeine did not help -discussed that she is happy that she does not have to return to [place] as those trips caused her a lot of fatigue -discussed that pain was bad after she cooked for Thanksgiving -discussed her follow-up with Dr. Lenord Fellers -discussed the negative experience when she last went to [place] -discussed that next available appointment was on June 24th or 26th, but they were able to move this up to May 22nd or 24th -discussed that she keeps a journal with the setting with the level of her pain.  -Continue Diclofenac 3%, Gabapentin 5%, Lidocaine 5%, Menthol 1% compounded at [facility]:  apply 1-2 pumps three to four times per day as needed, discussed that this dose help -discussed that she has had to turn off the stimulator more since she has established care at [facility] -discussed response to her stimulator -discussed doubling the gabapentin when pain is severe Increase low dose naltrexone to 3mg  HS -discussed her response to the stimulator She does have a history of migraines.  -Discussed that if she has to be in bed all day due to her pain it may be beneficial for her husband to give her at least one hydrocodone per day to enable her to function -provided refill of Fentanyl patch as she will be leaving for her surgery in [place] soon and her husband plans to stay there for some time in case she has any surgical complications.  -discussed her follow-up with pain psychology.  -recommended weaning off Fentanyl, discussed stopping fentanyl because she is on 12.5mg  today but she had tried this a couple of weeks ago and pain was so severe that she needed patch to be replaced.  -discussed that she is taking hydrocodone 4 tablets per day, and recommend to decrease this to 3 tabs per day for three days and then to keep decreasing the tab by  once per day.  -discussed that skipping the morning dose of hydrocodone will at least allow her to sleep -recommended trying Frankincense and Myrrh essential oils mixed in coconut oil and applied to area of pain -recommended restarting Topamax -[facility] to prescribe compounding gel refill -continue Gabapentin 100mg  up to 8 times a day, discussed her current schedule of takin D/c fentanyl -discussed using Oragel multiple times per ay since this helps for an hour, use the topical cream intermittently in between -continue follow-up with [facility] to set up an appointment for her with them. Unfortunately Dr. Clint Lipps has retired but was able to set her up with Dr. Myrla Halsted who specializes in migraines and trigeminal neuralgia on July 25th. Let patient and husband know. They will mail her their address.   -commended her for calling Dr. Lorrine Kin as well to let him know how she is feeling -recommended taking 1 hydrocodone now for the severe pain, and discussed that the Fentanyl patch with take 12-24 hours to kick in -discussed with April the neurology referral and she did specify Dr. Hector Shade and spoke with the [facility] department -discussed CBD oil, her son bought her some of this and she felt that it greatly helped  Recommend topical CBD oil- discussed its benefits in reducing inflammation, pain, insomnia, and anxiety, but she stopped  because effect wore of.  -Discussed that CBD oil differs from marijuana in that it does not contain THC- the substance that causes euphoria.  -Discussed that it is made from the hemp plant.  -It has been used for thousands of years -preliminary research suggests that if may be able to shrink cancerous tumors, stop plaque formation in Alzheimer's Disease, and slow the progress of brain disease from concussions.  -Additional benefits that have been demonstrated in studies include improved nausea, indigestion, and brain health, and reduced  seizures.  -In a survey 92% of patients who tried medical cannabis felt it improved symptoms such as chronic pain, arthritis, migraines, and cancer.   -Provided with a pain relief journal and discussed that it contains foods and lifestyle tips to naturally help to improve pain. Discussed that these lifestyle strategies are also very good for health unlike some medications which can have negative side effects. Discussed that the act of keeping a journal can be therapeutic and helpful to realize patterns what helps to trigger and alleviate pain.   -discussed with Dr. Marcia Brash trigeminal deafferation pain, discussed the difference between this and trigeminal neuralgia with patient and husband, discussed motor cortex stimulation. -discussed whether or not to continue fentanyl patch, husband does feel that the IV fentanyl  -encouraged following with Dr. Sweet/Dr. Hyacinth Meeker at [facility] to try procedure recommended by D. Ostegard.  -discussed mechanism of action of low dose naltrexone as an opioid receptor antagonist which stimulates your body's production of its own natural endogenous opioids, helping to decrease pain. Discussed that it can also decrease T cell response and thus be helpful in decreasing inflammation, and symptoms of brain fog, fatigue, anxiety, depression, and allergies. Discussed that this medication needs to be compounded at a compounding pharmacy and can more expensive. Discussed that I usually start at 1mg  and if this is not providing enough relief then I titrate upward on a monthly basis.   -stop lamotrigine as it is making her dizzy without benefit -reviewed patient's medical notes -discussed her progress with getting a referral to [facility] -recommended going to ED given darkening of skin, worsening eye closure, finding of cyst, dizziness to receive prompt neurosurgical eval regarding if surgery is necessary, and also for pain management given worsening pain refractory to all  treatments and resulting suicidality. Reviewed ED doc notes- he discussed with on-call neurology and NSGY and determined that patient could be discharged with outpatient follow-up. IV fentanyl was administered with relief to patient. She was discharged on fentanyl patch which has not provided relief. She is getting - no side effects- discussed increasing to and her husband is in agreement. Prior auth completed and approved. Discussed with husband starting patch on Satruday after she has worn patch for 72 hours -encouraged follow-up with Dr. Marcia Brash, husband let me know appointment has been set up 1/18.  -recommended follow-up with Dr. 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  -Discussed that turmeric  -discussed steroid taper. -continue savella, husband feels like it is helping with her suicidal thoughts. Increase to 25mg .  Discontinued hydrocodone  -discuss mouth guard with dentist.  -d/c topamax -she does have another procedure scheduled. -refilled Norco -follow-up monthly with Riley Lam and with me in February,  placed on waitlist for earlier appointment with me, advised phone visits/MyChart are a great way to communicate in the interim -discussed that she can take an additional one or two tylenol for breakthrough pain -recommended checking CMP for liver enzymes with next labs -prescribed NAC 600mg  BID to protect the liver while taking tylenol, discussed its health benefits for mitochondrial function -d/c Baclofen  -f/u with Dr. Marcia Brash and Dr. Lorrine Kin 1/18 -discussed retrying carbamazepine to see if it is effective for her. It is $89 for her, so we discussed trying  Lamictal instead.  -encouraged anti-inflammatory diet.  -she had sinusitis that was viral and was treated with antibiotics. This was in the early 90s. The symptoms resolved after 2 months. She uses to get sinus infections and saline rinse.  -She has had multiple root canals.  -Discontinue 100% oxygen via facemask as this was not helpful.  -Discussed that unfortunately Sprint PNS is not indicated for craniofacial pain. -Amitriptyline and Lyrica did not help.  -Continue Cymbalta 60mg .  -Topiramate helped but caused dizziness.  -Provided with hand out with information regarding trigeminal neuralgia -Failed Penytoin.  -trial lidocaine patch -She would like to try biofeedback. -reviewed neurology note.  -referred to Dr. Hector Shade.  -will try trigger point injections next visit.  -Continue Ketamine 10%, Baclofen 2%, Cyclobenzaprine 2%, Ketoprofen 10%, Gabapentin 6%, Bupivacaine 1%, Amitryptiline 5% to apply to painful areas- using 3-4 times per day.  -Can consider Meloxicam if Phenytoin does not help.  -Discussed Qutenza as an option for neuropathic pain control. Discussed that this is a capsaicin patch, stronger than capsaicin cream. Discussed that it is currently approved for diabetic peripheral neuropathy and post-herpetic neuralgia, but that it has also shown benefit in treating other forms of neuropathy. Provided patient with link to site to learn more about the patch: https://www.clark.biz/. Discussed that the patch would be placed in office and benefits usually last 3 months. Discussed that unintended exposure to capsaicin can cause severe irritation of eyes, mucous membranes, respiratory tract, and skin, but that Qutenza is a local treatment and does not have the systemic side effects of other nerve medications. Discussed that there may be pain, itching, erythema, and decreased sensory function associated with the application of Qutenza. Side effects usually subside within 1 week. A cold  pack of analgesic medications can help with these side effects. Blood pressure can also be increased due to pain associated with administration of the patch.  -she cut down on her gabapentin due to her dizziness    Turmeric to reduce inflammation--can be used in cooking or taken as a supplement.  Benefits of turmeric:  -Highly anti-inflammatory  -Increases antioxidants  -Improves memory, attention, brain disease  -Lowers risk of heart disease  -May help prevent cancer  -Decreases pain  -Alleviates depression  -Delays aging and decreases risk of chronic disease  -Consume with black pepper to increase absorption    Turmeric Milk Recipe:  1 cup milk  1 tsp turmeric  1 tsp cinnamon  1 tsp grated ginger (optional)  Black pepper (boosts the anti-inflammatory properties of turmeric).  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  -she is a good metabolizer of amitriptyline and has tolerated nortrypilline in the past, but without much benefit.    3) CKD: encouraged 6-8 glasses of water per day. She states that she had AKI with Ibuprofen in the past. Discussed checking Cr level next visit if we are considering Meloxicam -Discussed that Nucynta, and other pain medications, may stay in her bloodstream longer given her CKD   4) Hypernatremia: encouraged hydration   5) General health and pain: provided  with list of healthy foods that are both nutritious and fight pain.   6) Depressed: Amitriptyline did help with this but she could not tolerate the drowsiness. Encouraged focusing on the factors that she can control, such as anti-inflammatory diet.   7) Fibromyalgia: Discussed goal to wean steroids prior to surgery. Continue 1mg  daily. She is on that intermittently. She started this again about a month ago. She has been on this about a year. Continue Savella -discussed benefits of cold water showers.  -continue gabapentin.  -high dose vitamin D sent  8) Dizziness: -discussed that this could be from the combination of gabapentin and baclofen.  -discussed that all the medications we are trying for pain may worsen the dizziness  9) Constipation:  -Provided list of following foods that help with constipation and highlighted a few: 1) prunes- contain high amounts of fiber.  2) apples- has a form of dietary fiber called pectin that accelerates stool movement and increases beneficial gut bacteria 3) pears- in addition to fiber, also high in fructose and sorbitol which have laxative effect 4) figs- contain an enzyme ficin which helps to speed colonic transit 5) kiwis- contain an enzyme actinidin that improves gut motility and reduces constipation 6) oranges- rich in pectin (like apples) 7) grapefruits- contain a flavanol naringenin which has a laxative effect 8) vegetables- rich in fiber and also great sources of folate, vitamin C, and K 9) artichoke- high in inulin, prebiotic great for the microbiome 10) chicory- increases stool frequency and softness (can be added to coffee) 11) rhubarb- laxative effect 12) sweet potato- high fiber 13) beans, peas, and lentils- contain both soluble and insoluble fiber 14) chia seeds- improves intestinal health and gut flora 15) flaxseeds- laxative effect 16) whole grain rye bread- high in fiber 17) oat bran- high in soluble and insoluble fiber 18) kefir-  softens stools -recommended to try at least one of these foods every day.  -drink 6-8 glasses of water per day -walk regularly, especially after meals.   10) Osteopenia - was tapered off the prednisone -discussed that currently her pain may be more of a risk to her than osteopenia given her suicidal ideation from her pain -discussed risk of fall with the medications that make her dizziness.   11) Daytime sleepiness -discussed that this is secondary to her medications.  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  -discussed red light therapy  15) HTN: -discussed clonidine patch could potentially help with HTN and pain.  -BP is 170/90 today.  -Advised checking BP daily at home and logging results to bring into follow-up appointment with PCP and myself. -Reviewed BP meds today.  -Advised regarding healthy foods  that can help lower blood pressure and provided with a list: 1) citrus foods- high in vitamins and minerals 2) salmon and other fatty fish - reduces inflammation and oxylipins 3) swiss chard (leafy green)- high level of nitrates 4) pumpkin seeds- one of the best natural sources of magnesium 5) Beans and lentils- high in fiber, magnesium, and potassium 6) Berries- high in flavonoids 7) Amaranth (whole grain, can be cooked similarly to rice and oats)- high in magnesium and fiber 8) Pistachios- even more effective at reducing BP than other nuts 9) Carrots- high in phenolic compounds that relax blood vessels and reduce inflammation 10) Celery- contain phthalides that relax tissues of arterial walls 11) Tomatoes- can also improve cholesterol and reduce risk of heart disease 12) Broccoli- good source of magnesium, calcium, and potassium 13) Greek yogurt: high in potassium and  calcium 14) Herbs and spices: Celery seed, cilantro, saffron, lemongrass, black cumin, ginseng, cinnamon, cardamom, sweet basil, and ginger 15) Chia and flax seeds- also help to lower cholesterol and blood sugar 16) Beets- high levels of nitrates that relax blood vessels  17) spinach and bananas- high in potassium  -Provided lise of supplements that can help with hypertension:  1) magnesium: one high quality brand is [employer] since it contains all 7 types of magnesium, otherwise over the counter magnesium gluconate 400mg  is a good option 2) B vitamins 3) vitamin D 4) potassium 5) CoQ10 6) L-arginine 7) Vitamin C 8) Beetroot -Educated that goal BP is 120/80. -Made goal to incorporate some of the above foods into diet.    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  -discussed that she had weaned off the steroids and had been doing fine off the steroids -discussed that aquatic therapy can help -discussed that she should encourage follow-up with rheumatology to assess for giant cell arteritis -[facility] -continue 2mg  steroids  16. Sciatica: -consider red light therapy -discussed that this has improved -discussed aquatherapy -discussed that the spinal injection helped temporarily -discussed that she has been taking 4mg  of low dose naltrexone -discussed that she used some hydrocodone she had left over from the past.  -discussed lumbosacral orthosis, advised using for activities that involve bending/twisting/walking on uneven terrain -discussed that it would probably be better to repeat steroid injection than to continue hydrocodone -advised to use lidocaine patch -continue capsaicin cream  17. Family illness: -discussed that she lost 2 nephews recently  41. Depression:  -continue cymbalta -encouraged follow-up with psychiatry   19. Dizziness: -discussed that this has improved  20. 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 Letitia Caul care, discussed that he recommended a decompression chamber, discussed that that might not be covered by insurance, discussed that psychologist follow-up was recommended, discussed that she continues on cymbalta and that she is beginning to get depressed by her pain, discussed increasing gabapentin but she defers since it makes her  so sleepy and that the stimulator makes her sleepy as well, discussed the medications that she has failed including gabapentin/lyrica/Savella/topamax, discussed that she can hardly open her left eye and that sometimes this is awfully bad, discussed that her tears burn and that she uses eye drops, discussed that they have joined [facility] and she has been once since they joined and she really enjoyed it and wants to go back but her pain has been so bad, discussed the walking track they have is great for winter, discussed that she continues to take 2mg  steroids per day, discussed that she experienced polymyalgia rheumatica, discussed that prednisonde may be helping her trigeminal neuralgia

## 2023-05-31 DIAGNOSIS — G509 Disorder of trigeminal nerve, unspecified: Secondary | ICD-10-CM | POA: Diagnosis not present

## 2023-06-22 DIAGNOSIS — H353123 Nonexudative age-related macular degeneration, left eye, advanced atrophic without subfoveal involvement: Secondary | ICD-10-CM | POA: Diagnosis not present

## 2023-07-05 ENCOUNTER — Encounter: Payer: Self-pay | Admitting: Physical Medicine and Rehabilitation

## 2023-07-06 ENCOUNTER — Other Ambulatory Visit (HOSPITAL_COMMUNITY): Payer: Self-pay | Admitting: Physician Assistant

## 2023-07-06 ENCOUNTER — Encounter: Payer: Self-pay | Admitting: Physician Assistant

## 2023-07-06 ENCOUNTER — Other Ambulatory Visit: Payer: Self-pay | Admitting: Physical Medicine and Rehabilitation

## 2023-07-06 DIAGNOSIS — G500 Trigeminal neuralgia: Secondary | ICD-10-CM

## 2023-07-06 DIAGNOSIS — Z9682 Presence of neurostimulator: Secondary | ICD-10-CM

## 2023-07-06 MED ORDER — HYDROCODONE-ACETAMINOPHEN 5-325 MG PO TABS: 1.0000 | ORAL_TABLET | Freq: Two times a day (BID) | ORAL | 0 refills | Status: DC | PRN

## 2023-07-06 MED ORDER — FENTANYL 25 MCG/HR TD PT72: 1.0000 | MEDICATED_PATCH | TRANSDERMAL | 0 refills | Status: DC

## 2023-07-13 ENCOUNTER — Ambulatory Visit (HOSPITAL_COMMUNITY)
Admission: RE | Admit: 2023-07-13 | Discharge: 2023-07-13 | Disposition: A | Discharge status: Home / Self Care | Payer: Medicare Other | Source: Ambulatory Visit | Attending: Physician Assistant | Admitting: Physician Assistant

## 2023-07-13 DIAGNOSIS — Z9682 Presence of neurostimulator: Secondary | ICD-10-CM | POA: Insufficient documentation

## 2023-07-13 DIAGNOSIS — G500 Trigeminal neuralgia: Secondary | ICD-10-CM | POA: Insufficient documentation

## 2023-07-13 DIAGNOSIS — G930 Cerebral cysts: Secondary | ICD-10-CM | POA: Diagnosis not present

## 2023-07-14 DIAGNOSIS — M150 Primary generalized (osteo)arthritis: Secondary | ICD-10-CM | POA: Diagnosis not present

## 2023-07-14 DIAGNOSIS — M25562 Pain in left knee: Secondary | ICD-10-CM | POA: Diagnosis not present

## 2023-07-14 DIAGNOSIS — M25559 Pain in unspecified hip: Secondary | ICD-10-CM | POA: Diagnosis not present

## 2023-07-14 DIAGNOSIS — M810 Age-related osteoporosis without current pathological fracture: Secondary | ICD-10-CM | POA: Diagnosis not present

## 2023-07-14 DIAGNOSIS — M353 Polymyalgia rheumatica: Secondary | ICD-10-CM | POA: Diagnosis not present

## 2023-07-14 DIAGNOSIS — M797 Fibromyalgia: Secondary | ICD-10-CM | POA: Diagnosis not present

## 2023-07-14 DIAGNOSIS — M170 Bilateral primary osteoarthritis of knee: Secondary | ICD-10-CM | POA: Diagnosis not present

## 2023-07-19 DIAGNOSIS — Z9682 Presence of neurostimulator: Secondary | ICD-10-CM | POA: Diagnosis not present

## 2023-07-19 DIAGNOSIS — G509 Disorder of trigeminal nerve, unspecified: Secondary | ICD-10-CM | POA: Diagnosis not present

## 2023-07-19 DIAGNOSIS — G500 Trigeminal neuralgia: Secondary | ICD-10-CM | POA: Diagnosis not present

## 2023-07-20 DIAGNOSIS — F451 Undifferentiated somatoform disorder: Secondary | ICD-10-CM | POA: Diagnosis not present

## 2023-07-22 ENCOUNTER — Encounter
Payer: Medicare Other | Attending: Physical Medicine and Rehabilitation | Admitting: Physical Medicine and Rehabilitation

## 2023-07-22 DIAGNOSIS — G500 Trigeminal neuralgia: Secondary | ICD-10-CM | POA: Diagnosis not present

## 2023-07-22 MED ORDER — FENTANYL 25 MCG/HR TD PT72: 1.0000 | MEDICATED_PATCH | TRANSDERMAL | 0 refills | Status: DC

## 2023-07-22 NOTE — Progress Notes (Signed)
Subjective:    Patient ID: [name], female    DOB: 19-Dec-1940, 83 y.o.   MRN: [id number]  HPI  An audio/video tele-health visit is felt to be the most appropriate encounter for this patient at this time. This is a follow up tele-visit via phone. The patient is at home. MD is at office. Prior to scheduling this appointment, our staff discussed the limitations of evaluation and management by telemedicine and the availability of in-person appointments. The patient expressed understanding and agreed to proceed.   [name] presents today for f/u of severe trigeminal neuralgia.   1) Severe trigeminal neuralgia: -today pain is 8/10 -she is not doing very well -they had to cancel one of the appointments due to snow -the next week [name] who is doing her programming had the flu -she turned off the device for 2 weeks as it was making her pain worse -she had an appointment with [name] on Monday and tried new programs and they seemed to be working better -she is very happy with her new [facility] but has not found any helpful settings yet -she turns off the stimulator at night as she can sleep well at night -if she turns off the stimulator during the day she has 1 hour of relief and then the old pain returns -the last two or three days she has had bad pain -she is filling out pain diaries -she was able to cook Thanksgiving dinner but felt a lot of pain the next day -she is doing ok -she likes her new physician at [facility] He works a lot with the [employer] rep because the rep is just for back pain -he takes pictures and asks for the location to be adjusted -at [facility] she is just given one program and then it stops working.  -Saw Dr. Gloriann Loan 8 weeks ago and the new programs worked but then it stops working when she returns home -she is seeing neurosurgeon Dr. Lenord Fellers on Thursday  [name] researched red light therapy -she went to see the doctor in [place] and was only been able to spend 5  minutes with her -they did about 15 minutes of adjustments and the new programs seemed to hurt more than they help -has had stimulator turned off since last night -was able to enjoy her birthday -she did well for 3 weeks but recently pain has been around 8-10 -when pain is very severe it makes her eye hurt -the way her stimulator is set it makes her nose hurt She is going back to [facility] on April 10th -she is managing with the gabapentin and had to take 2 extra yesterday and it did not make her sleepier  -pain fluctuates  -she went to [place] last week with her children.  -she keeps a journal of when she changes the program and what her pain level is in her teeth, eyes, and cheek. She put in not to use the teeth program -she said if they could hit the nerve between the two teeth that would be helpful -they have her an 11 and 12 in that region,  -she cannot get the right level of stimulation. Before she left she decreased it by 50%. Now she is on a cheat program and the lowest amount. It is helping the teeth but hurting the eye.  -Dr. Gloriann Loan had a baby on 11/14 and she is not coming back until January.  -she talked to the representative -last night she turned off the stimulator -she  slept almost day today and did not want to wake up for supper which was rare for her -she is having a difficult time weaning off the hydrocodone.  -she has been off of it in two days -she is expecting a return call from Dr. Paulo Fruit team soon to discuss wean off fentanyl -drove up on Tuesday and on Wednesday was supposed to have an appointment with pain psychologist. When they got there they were told that appointment had been changed to virtual. The next day they had a more sophisticated MRI. [name] did moving in MRI so MRI was not as helpful. The next day they saw Dr. Gloriann Loan and discussed several procedural options and then went back to the motor stimulator- that is scheduled on October 16th.  -she recently went on  vacation and had to spend the majority of the vacation in bed due to the severity of her pain -she stopped using the intranasal ketamine because although it helped initially, it appeared to make her pain worse later -she asks if she can wean off some medication as she had a recent appointment with Dr. Gloriann Loan and was told she may have a lot of postoperative pain if she is taking so much pain medication. She would still like to do this and also experiences sedation from her medication. She asks how she would go about weaning her medications -she asks if she should wean off the gabapentin since this makes her so sleepy She asks if she should restart Topamax. -she was encouraged by her phone visit with Dr. Gloriann Loan and plans to pursue surgery. She is going to [facility] next week for Brain MRI, neuropsych eval, and to discuss the risks and benefits of the procedure with Dr. Gloriann Loan -pain was so severe in the past that she expressed suicidality to her husband [name].  -she feels that none of her medications are helpful but she knows that when she tries to wean them her pain worsens.  -she asks about refill of the compounding cream -her husband asks about referral to ketamine clinic for depression rather than pain -she went back to the dentist because it was urting between two teeth and he said there is nothing wrong with those teeth and it is the trigeminal nerve When she took a shower that usually seems to help with but was migrating around her face.  -appointment with neurology in July -Dr. Gloriann Loan cancelled the zoom call in April but this was rescheduled to July.  -she finds that only the gabapentin helps -she is not sure if the fentanyl helps When she is feeling really bad she tries to take another one but she gets very sleepy and dizzy.  -she is taking 8 a day of 100mg  per day- she takes two gabapentin and tylenol and hydrocodone in the morning. She takes another two at breakfast around 11, another 2 at lunch  around 1:30pm.  -she had a recent root canal and pain has become much more severe after that -her pain is in the two teeth next to where she had the root canal -Oragel heps for a bout an hour.  -she tried increasing her Gabapentin to 300mg  TID but she could not tolerate this. It made her too sleepy.  -she contacted Dr. Lorrine Kin and he recommended she pause wean of Fentanyl patch and I agree with this -she took last patch last night. She still has 12.3mcg patch available -her husband asks whether she should take a higher dose of gabapentin -the fentanyl patch makes  her very sleepy and so allows her to sleep. It has also made her dizzier.  -the fentanyl patch did not provide more relief- she would like to wean off. Weaned to 25, then 12.86mcg, then off this week -referral was to [facility] and not to Dr. Hector Shade she states and they need a new referral send to Dr. Clint Lipps -she is using the compound cream and this helps- she uses this twice per day -capsaicin made it burn  -lamotrigine makes her too sleepy and dizzy to come down the stairs by herself and she does not feel comfortable going to the shower -left eye more open than usual. -she does not feel she is getting relief from the fentanyl patch -she feels that the procedure at [facility] is a last resort.  -Given darkening of skin and worsening eye lid closure, MRI brain stat obtained and shows new cyst that could be post-surgical. Recommended she go to ED given worsening dizziness and facial swelling as well. ED physician discussed case with NSGY and neurology and discharged home recommending follow-up with Dr. Belva Bertin and Dr. Molli Hazard. She was given IV Fentanyl in the ED which helped and discharged on Fentanyl patch, which has not helped.  -discussed increasing Fentanyl patch to since she has not experienced any negative side effects, no respiratory depression, and her husband is agreeable. I have sent in new  script to start Saturday, but insurance required prior auth, which was approved.  -tonight would be the third tay of the Fentanyl patch -she is still taking the Gabapentin -she feels that when she lays down she is spinning.  -eye continues to appear more closed.  -Husband has called Dr. Kathlene Cote and Dr. Barb Merino office to scheduled follow-up appointments and has appointments with both tomorrow.  -Will run out of Fentanyl patch by Sunday -pain is worsening and nothing is providing relief -she asks whether she should go to ED -she has also noted dizziness and is not sure if this is from being in the MRI or from the [address].  -morphine was not effective and caused itching, sarna lotion did help wit this.  -hydrocodone has been most effective thus far -nucynta was not effective but she is not sure whether she gave it a fair trial -she has month appointments scheduled with Riley Lam and f/u with me in Feb -she used the Norco last night and this morning with good benefit and has started to feel the pain return this afternoon -she did find benefit from the compound cream- she continues to use this.  -She continues to have a sharp shooting pain doing around her eyelid and the tip teeth and in the top of her head.  -has a radiofrequency ablation treatment that helped but not completely -she is not having as much sharp knife-like pin like in her eye Her top teeth in her left law have been hurting really badly.  -she now takes the baclofen with the gabapentin and they seem to work well together.  -she had surgical procedure and feels worse -yesterday she took a half a tablet but this morning she took a whole one. She does fine with the hydrocodone but a whole one makes her too sleepy.  -she is taking 2 100mg  Gabapentin three times per day but it makes her dizzy and sleepy so she cannot funciton -the cold worsens her pain.  -The IV fentanyl did help with the pain.  -her husband feels she should  get one more dose  of the 25mg  fentanyl. -she did get one week benefit from the steroid injection.  -she is doing terribly -she is miserable -she feels depressed due to the pain -she has considered taking the whole bottle of hydrocodone because because she can't take this pain anymore.  -she started taking the Gabapentin 300mg  TID.  -she does find relief from the hydrocodone -the pain is constant -she wonders if it is inflammation.  -she has not tried turmeric. Her son-in-law gave her a bottle and she couldn't stomach it.   2) Fibromyalgia: -She has been almost nonfunctional due to the severity of her pain. It continues to be very severe.  -She would like to wean off the Gabapentin as she feels at risk of falls -she is taking Topamax BID. She felt this was helpful but made her unsteady on her feet.  -still using gabapentin and this is helping.  -she feels so fatigued that she can only have a shower every other day.   3) Osteoporosis: -husband asks whether she should get a Prolia shot.   4) Dizziness: -head was swimming -she felt like she was going to fall.  -worsening -feels like she is spinning, not the room  -She tried the Phenytoin for 2 days and did not feel benefit so stopped it.   -she feels the gabapentin aggravates this and she would love to wean off but knows it helps her.   -She feels very sleepy during the day.   -She has never tried Topirmate before.   5) Sore throat -developed stopping lamotrigine since not helping.   Prior history:  Her pain continues to be in her left eye, forehead. She has not tried any topicals for pain. She uses capsaicin for her back and knees.   Since last visit nothing we have tried has helped with her pain. She has tried to decrease her Gabapentin dose but then her pain increased so she has resumed taking 4 Gabapentin per day.   Her husband asks whether he should contact her surgeon regarding whether nerve may have become unclamped.    Discussed prolotherapy, trigger point injections, Baclofen, Clonazepam, Phenytoin as other options we could try,=.  Pain continues to be excruciating and she states she is not sure how much longer she can live like this.   Prior history: She has having some nausea, lightheadedness and felt this may be related to her Gabapentin. She did not take any yesterday and then felt more sever pain in the evening. She also gets electrical shooting across her jaw.   She does not feel that she has cluster headaches as the pain has been so constant. She feels that she is getting worse. The oxygen made her feel relaxed but it is not helping.   The Sarna lotion does help with the itching in her head.   She was willing to try the Amitriptyline but it makes her too sleepy in the morning.   Her teeth and left eye incredibly hurt.   She feels that the Gabapentin has been making her dizziness.   She asks about biofeedback.   The pain is 7/10 on average, similar to last time.   When the pain is severe it can make her depressed.    6) Macular degeneration:  Has been following with ophthalmology  7) Family illness: -she lost two nephews recently  Pain Inventory Average pain 8 Pain Right Now 18 My pain is constant, sharp, burning, dull, stabbing, and aching  In the last 24 hours, has  pain interfered with the following? General activity 9 Relation with others 9 Enjoyment of life 10  What TIME of day is your pain at its worst? morning  and daytime Sleep (in general) Good  Pain is worst with  bending & standing, walking, sitting, inactivity, some activities  Pain improves with rest, heat, medication, therapy, injections  Relief with medication is 1   Family History  Problem Relation Age of Onset   Heart disease Mother    Heart disease Father    Stroke Father    Stroke Sister    Asthma Daughter    Colon cancer Neg Hx    Social History   Socioeconomic History   Marital status:  Married    Spouse name: Not on file   Number of children: 2   Years of education: college   Highest education level: Not on file  Occupational History   Occupation: housewife    Employer: RETIRED  Tobacco Use   Smoking status: Never   Smokeless tobacco: Never  Vaping Use   Vaping status: Never Used  Substance and Sexual Activity   Alcohol use: No    Alcohol/week: 0.0 standard drinks of alcohol   Drug use: No   Sexual activity: Not on file  Other Topics Concern   Not on file  Social History Narrative   1 cup of Jasmine tea daily    Social Drivers of Health   Financial Resource Strain: Low Risk  (05/08/2023)   Received from [facility]   Overall Financial Resource Strain (CARDIA)    Difficulty of Paying Living Expenses: Not hard at all  Food Insecurity: No Food Insecurity (05/08/2023)   Received from [facility]   Hunger Vital Sign    Worried About Running Out of Food in the Last Year: Never true    Ran Out of Food in the Last Year: Never true  Transportation Needs: No Transportation Needs (05/08/2023)   Received from [facility] - Transportation    In the past 12 months, has lack of transportation kept you from medical appointments or from getting medications?: No    Lack of Transportation (Non-Medical): No  Physical Activity: Inactive (11/13/2019)   Received from [facility] visits prior to 08/22/2022., [facility] visits prior to 08/22/2022.   Exercise Vital Sign    Days of Exercise per Week: 0 days    Minutes of Exercise per Session: 0 min  Stress: No Stress Concern Present (11/13/2019)   Received from [facility] visits prior to 08/22/2022., [facility] visits prior to 08/22/2022.   [employer] of Occupational Health - Occupational Stress Questionnaire    Feeling of Stress : Not at all  Social Connections: Socially  Integrated (11/13/2019)   Received from [facility] visits prior to 08/22/2022., [facility] visits prior to 08/22/2022.   Social Advertising account executive [NHANES]    Frequency of Communication with Friends and Family: More than three times a week    Frequency of Social Gatherings with Friends and Family: More than three times a week    Attends Religious Services: 1 to 4 times per year    Active Member of [employer] or Organizations: Yes    Attends Banker Meetings: 1 to 4 times per year    Marital Status: Married   Past Surgical History:  Procedure Laterality Date  CHOLECYSTECTOMY  03/2010   COLONOSCOPY  2009   Dr. Harvie Junior    CYSTOSCOPY     Multiple Cystoscopies with stents or biopsy    ESOPHAGOGASTRODUODENOSCOPY  10/2013   EYE SURGERY     bilateral cataract removal   Gamma knife radiation  01/2018   for trigeminal neuralgia   REFRACTIVE SURGERY Bilateral 2022   RHIZOTOMY Left 04/28/2021   Procedure: Left Percutaneous trigeminal balloon rhizotomy;  Surgeon: Jadene Pierini, MD;  Location: [facility] OR;  Service: Neurosurgery;  Laterality: Left;   TONSILLECTOMY     Past Surgical History:  Procedure Laterality Date   CHOLECYSTECTOMY  03/2010   COLONOSCOPY  2009   Dr. Harvie Junior    CYSTOSCOPY     Multiple Cystoscopies with stents or biopsy    ESOPHAGOGASTRODUODENOSCOPY  10/2013   EYE SURGERY     bilateral cataract removal   Gamma knife radiation  01/2018   for trigeminal neuralgia   REFRACTIVE SURGERY Bilateral 2022   RHIZOTOMY Left 04/28/2021   Procedure: Left Percutaneous trigeminal balloon rhizotomy;  Surgeon: Jadene Pierini, MD;  Location: [facility] OR;  Service: Neurosurgery;  Laterality: Left;   TONSILLECTOMY     Past Medical History:  Diagnosis Date   Adenomatous colon polyp 03/24/2018   Allergy    Arthritis    Cataract    Chronic headaches    Depression    Fibromyalgia    Gallstones    GERD  (gastroesophageal reflux disease)    Hyperlipemia    Hypertension    Interstitial cystitis    Lactose intolerance    LBBB (left bundle branch block)    Osteoarthritis    Osteopenia    Pneumonia    Thyroid disease    Tinnitus    Trigeminal neuralgia    There were no vitals taken for this visit.  Opioid Risk Score:   Fall Risk Score:  `1  Depression screen [facility] 2/9     05/25/2023   11:27 AM 02/23/2023   11:21 AM 12/28/2022    1:35 PM 10/27/2022    2:05 PM 09/15/2022    1:25 PM 07/21/2022    1:38 PM 05/19/2022    1:11 PM  Depression screen PHQ 2/9  Decreased Interest 0 0 0 0 0 1 0  Down, Depressed, Hopeless 0 0 0 0 0 1 0  PHQ - 2 Score 0 0 0 0 0 2 0    Review of Systems  Constitutional: Negative.   HENT: Negative.    Eyes:  Positive for visual disturbance.  Respiratory: Negative.    Cardiovascular: Negative.   Gastrointestinal: Negative.   Endocrine: Negative.   Genitourinary: Negative.   Musculoskeletal: Negative.        Pain in left side of face  Skin: Negative.   Allergic/Immunologic: Negative.   Neurological:  Positive for dizziness, tremors, light-headedness and numbness.       Tingling   Hematological: Negative.   Psychiatric/Behavioral: Negative.    All other systems reviewed and are negative.      Objective:   Physical Exam PRIOR EXAM Gen: no distress, normal appearing, BP 149/75. Weight 144lbs, BMI 25.92 Gen: no distress, normal appearing HEENT: oral mucosa pink and moist, NCAT Cardio: Reg rate Chest: normal effort, normal rate of breathing Abd: soft, non-distended Ext: no edema Psych: pleasant, normal affect Skin: intact Neuro: Alert and oriented x3 . Assessment & Plan:  Mrs. Nappi is an 82 year old woman who presents for f/u of left sided trigeminal neuralgia and dizziness.  1) Sensory deafferation pain, left sided trigeminal neuralgia vs cluster headaches (she does have lacrimation and runny nose on that side). -discussed that she has been  happy with her new provider at [facility] -d/c narcan and naltrexone, discussed that she uses the hydrocodone sparingly.  -discussed that she had new programs installed on Monday -discussed that she is doing well with the hydrocodone -discussed pruritus with the fentanyl patch -continue following with pain psychologist, discussed that I think it is phenomenal that she is doing this   -discussed mechanism of action of low dose naltrexone as an opioid receptor antagonist which stimulates your body's production of its own natural endogenous opioids, helping to decrease pain. Discussed that it can also decrease T cell response and thus be helpful in decreasing inflammation, and symptoms of brain fog, fatigue, anxiety, depression, and allergies. Discussed that this medication needs to be compounded at a compounding pharmacy and can more expensive. Discussed that I usually start at 1mg  and if this is not providing enough relief then I titrate upward on a monthly basis.    -discussed that savella caused hallucinations -discussed that we tried lyrica and we cannot recall but she had some side effect -discussed that topamax did not help and made her feel worse -discussed that carbamazpeine did not help -discussed that she is happy that she does not have to return to [place] as those trips caused her a lot of fatigue -discussed that pain was bad after she cooked for Thanksgiving -discussed her follow-up with Dr. Lenord Fellers -discussed the negative experience when she last went to [place] -discussed that next available appointment was on June 24th or 26th, but they were able to move this up to May 22nd or 24th -discussed that she keeps a journal with the setting with the level of her pain.  -Continue Diclofenac 3%, Gabapentin 5%, Lidocaine 5%, Menthol 1% compounded at [facility]:  apply 1-2 pumps three to four times per day as needed, discussed that this dose help -discussed that she has had to turn off the  stimulator more since she has established care at [facility] -discussed response to her stimulator -discussed doubling the gabapentin when pain is severe Increase low dose naltrexone to 3mg  HS -discussed her response to the stimulator She does have a history of migraines.  -Discussed that if she has to be in bed all day due to her pain it may be beneficial for her husband to give her at least one hydrocodone per day to enable her to function -provided refill of Fentanyl patch as she will be leaving for her surgery in [place] soon and her husband plans to stay there for some time in case she has any surgical complications.  -discussed her follow-up with pain psychology.  -recommended weaning off Fentanyl, discussed stopping fentanyl because she is on 12.5mg  today but she had tried this a couple of weeks ago and pain was so severe that she needed patch to be replaced.  -discussed that she is taking hydrocodone 4 tablets per day, and recommend to decrease this to 3 tabs per day for three days and then to keep decreasing the tab by once per day.  -discussed that skipping the morning dose of hydrocodone will at least allow her to sleep -recommended trying Frankincense and Myrrh essential oils mixed in coconut oil and applied to area of pain -recommended restarting Topamax -called [facility] to prescribe compounding gel refill -continue Gabapentin 100mg  up to 8 times a day, discussed her current schedule  of takin D/c fentanyl -discussed using Oragel multiple times per ay since this helps for an hour, use the topical cream intermittently in between -continue follow-up with [facility] to set up an appointment for her with them. Unfortunately Dr. Clint Lipps has retired but was able to set her up with Dr. Myrla Halsted who specializes in migraines and trigeminal neuralgia on July 25th. Let patient and husband know. They will mail her their address.   -commended her for calling Dr. Lorrine Kin as well to let  him know how she is feeling -recommended taking 1 hydrocodone now for the severe pain, and discussed that the Fentanyl patch with take 12-24 hours to kick in -discussed with April the neurology referral and she did specify Dr. Hector Shade and spoke with the [facility] Neurology department -discussed CBD oil, her son bought her some of this and she felt that it greatly helped  Recommend topical CBD oil- discussed its benefits in reducing inflammation, pain, insomnia, and anxiety, but she stopped because effect wore of.  -Discussed that CBD oil differs from marijuana in that it does not contain THC- the substance that causes euphoria.  -Discussed that it is made from the hemp plant.  -It has been used for thousands of years -preliminary research suggests that if may be able to shrink cancerous tumors, stop plaque formation in Alzheimer's Disease, and slow the progress of brain disease from concussions.  -Additional benefits that have been demonstrated in studies include improved nausea, indigestion, and brain health, and reduced seizures.  -In a survey 92% of patients who tried medical cannabis felt it improved symptoms such as chronic pain, arthritis, migraines, and cancer.   -Provided with a pain relief journal and discussed that it contains foods and lifestyle tips to naturally help to improve pain. Discussed that these lifestyle strategies are also very good for health unlike some medications which can have negative side effects. Discussed that the act of keeping a journal can be therapeutic and helpful to realize patterns what helps to trigger and alleviate pain.   -discussed with Dr. Marcia Brash trigeminal deafferation pain, discussed the difference between this and trigeminal neuralgia with patient and husband, discussed motor cortex stimulation. -discussed whether or not to continue fentanyl patch, husband does feel that the IV fentanyl  -encouraged following with Dr. Sweet/Dr. Hyacinth Meeker at [facility] to try procedure recommended by D. Ostegard.  -discussed mechanism of action of low dose naltrexone as an opioid receptor antagonist which stimulates your body's production of its own natural endogenous opioids, helping to decrease pain. Discussed that it can also decrease T cell response and thus be helpful in decreasing inflammation, and symptoms of brain fog, fatigue, anxiety, depression, and allergies. Discussed that this medication needs to be compounded at a compounding pharmacy and can more expensive. Discussed that I usually start at 1mg  and if this is not providing enough relief then I titrate upward on a monthly basis.   -stop lamotrigine as it is making her dizzy without benefit -reviewed patient's medical notes -discussed her progress with getting a referral to [facility] -recommended going to ED given darkening of skin, worsening eye closure, finding of cyst, dizziness to receive prompt neurosurgical eval regarding if surgery is necessary, and also for pain management given worsening pain refractory to all treatments and resulting suicidality. Reviewed ED doc notes- he discussed with on-call neurology and NSGY and determined that patient could be discharged with outpatient follow-up. IV fentanyl was administered with relief to patient. She was discharged on fentanyl  patch which has not provided relief. She is getting - no side effects- discussed increasing to and her husband is in agreement. Prior auth completed and approved. Discussed with husband starting patch on Satruday after she has worn patch for 72 hours -encouraged follow-up with Dr. Marcia Brash, husband let me know appointment has been set up 1/18.  -recommended follow-up with Dr. 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  -Discussed that turmeric  -discussed steroid taper. -continue savella, husband feels like it is helping with her suicidal thoughts. Increase to 25mg .  Discontinued hydrocodone  -discuss mouth guard with dentist.  -d/c topamax -she does have another procedure scheduled. -refilled Norco -follow-up monthly with Riley Lam and with me in February, placed on waitlist for earlier appointment with me, advised phone visits/MyChart are a great way to communicate in the interim -discussed that she can take an additional one or two tylenol for breakthrough pain -recommended checking CMP for liver enzymes with next labs -prescribed NAC 600mg  BID to protect the liver while taking tylenol, discussed its health benefits for mitochondrial function -d/c Baclofen  -f/u with Dr. Marcia Brash and Dr. Lorrine Kin 1/18 -discussed retrying carbamazepine to see if it is effective for her. It is $89 for her, so we discussed trying Lamictal instead.  -encouraged anti-inflammatory diet.  -she had sinusitis that was viral and was treated with antibiotics. This was in the early 90s. The symptoms resolved after 2 months. She uses to get sinus infections and saline rinse.  -She has had multiple root canals.  -Discontinue 100% oxygen via facemask as this was not helpful.  -Discussed that unfortunately Sprint PNS is not indicated for craniofacial pain. -Amitriptyline and Lyrica did not help.  -Continue Cymbalta 60mg .  -Topiramate helped but caused dizziness.  -Provided with hand out with information regarding trigeminal neuralgia -Failed Penytoin.  -trial lidocaine patch -She would like to try biofeedback. -reviewed neurology note.  -referred to Dr. Hector Shade.  -will try trigger point injections next visit.  -Continue Ketamine 10%, Baclofen 2%, Cyclobenzaprine 2%, Ketoprofen 10%, Gabapentin 6%,  Bupivacaine 1%, Amitryptiline 5% to apply to painful areas- using 3-4 times per day.  -Can consider Meloxicam if Phenytoin does not help.  -Discussed Qutenza as an option for neuropathic pain control. Discussed that this is a capsaicin patch, stronger than capsaicin cream. Discussed that it is currently approved for diabetic peripheral neuropathy and post-herpetic neuralgia, but that it has also shown benefit in treating other forms of neuropathy. Provided patient with link to site to learn more about the patch: https://www.clark.biz/. Discussed that the patch would be placed in office and benefits usually last 3 months. Discussed that unintended exposure to capsaicin can cause severe irritation of eyes, mucous membranes, respiratory tract, and skin, but that Qutenza is a local treatment and does not have the systemic side effects of other nerve medications. Discussed that there may be pain, itching, erythema, and decreased sensory function associated with the application of Qutenza. Side effects usually subside within 1 week. A cold pack of analgesic medications can help with these side effects. Blood pressure can also be increased due to pain associated with administration of the patch.  -she cut down on her gabapentin due to her dizziness  Turmeric to reduce inflammation--can be used in cooking or taken as a supplement.  Benefits of turmeric:  -Highly anti-inflammatory  -Increases antioxidants  -Improves memory, attention, brain disease  -Lowers risk of heart disease  -May help prevent cancer  -Decreases pain  -Alleviates depression  -Delays aging and decreases risk of chronic disease  -Consume with black pepper to increase absorption    Turmeric Milk Recipe:  1 cup milk  1 tsp turmeric  1 tsp cinnamon  1 tsp grated ginger (optional)  Black pepper (boosts the anti-inflammatory properties of turmeric).  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  -she is a good metabolizer of amitriptyline and has tolerated nortrypilline in the past, but without much benefit.    3) CKD: encouraged 6-8 glasses of water per day. She states that she had AKI with Ibuprofen in the past. Discussed checking Cr level next visit if we are considering Meloxicam -Discussed that Nucynta, and other pain medications, may stay in her bloodstream longer given her CKD   4) Hypernatremia: encouraged hydration   5) General health and pain: provided with list of healthy foods that are both nutritious and fight pain.   6) Depressed: Amitriptyline did help with this but she could not tolerate the drowsiness. Encouraged focusing on the factors that she can control, such as anti-inflammatory diet.   7) Fibromyalgia: Discussed goal to wean steroids prior to surgery. Continue 1mg  daily. She is on that intermittently. She started this again about a month ago. She has been on this about a year. Continue Savella -discussed benefits of cold water showers.  -continue gabapentin.  -high dose vitamin D sent  8) Dizziness: -discussed that this could be from the combination of gabapentin and baclofen.  -discussed that all the medications we are trying for pain may worsen the dizziness  9) Constipation:  -Provided list of following foods that help with constipation and highlighted a few: 1) prunes- contain high amounts of fiber.   2) apples- has a form of dietary fiber called pectin that accelerates stool movement and increases beneficial gut bacteria 3) pears- in addition to fiber, also high in fructose and sorbitol which have laxative effect 4) figs- contain an enzyme ficin which helps to speed colonic transit 5) kiwis- contain an enzyme actinidin that improves gut motility and reduces constipation 6) oranges- rich in pectin (like apples) 7) grapefruits- contain a flavanol naringenin which has a laxative effect 8) vegetables- rich in fiber and also great sources of folate, vitamin C, and K 9) artichoke- high in inulin, prebiotic great for the microbiome 10) chicory- increases stool frequency and softness (can be added to coffee) 11) rhubarb- laxative effect 12) sweet potato- high fiber 13) beans, peas, and lentils- contain both soluble and insoluble fiber 14) chia seeds- improves intestinal health and gut flora 15) flaxseeds- laxative effect 16) whole grain rye bread- high in fiber 17) oat bran- high in soluble and insoluble fiber 18) kefir- softens stools -  recommended to try at least one of these foods every day.  -drink 6-8 glasses of water per day -walk regularly, especially after meals.   10) Osteopenia - was tapered off the prednisone -discussed that currently her pain may be more of a risk to her than osteopenia given her suicidal ideation from her pain -discussed risk of fall with the medications that make her dizziness.   11) Daytime sleepiness -discussed that this is secondary to her medications.  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  -discussed red light therapy  15) HTN: -discussed clonidine patch could potentially help with HTN and pain.  -BP is 170/90 today.  -Advised checking  BP daily at home and logging results to bring into follow-up appointment with PCP and myself. -Reviewed BP meds today.  -Advised regarding healthy foods that can help lower blood pressure and provided with a list: 1) citrus foods- high in vitamins and minerals 2) salmon and other fatty fish - reduces inflammation and oxylipins 3) swiss chard (leafy green)- high level of nitrates 4) pumpkin seeds- one of the best natural sources of magnesium 5) Beans and lentils- high in fiber, magnesium, and potassium 6) Berries- high in flavonoids 7) Amaranth (whole grain, can be cooked similarly to rice and oats)- high in magnesium and fiber 8) Pistachios- even more effective at reducing BP than other nuts 9) Carrots- high in phenolic compounds that relax blood vessels and reduce inflammation 10) Celery- contain phthalides that relax tissues of arterial walls 11) Tomatoes- can also improve cholesterol and reduce risk of heart disease 12) Broccoli- good source of magnesium, calcium, and potassium 13) Greek yogurt: high in potassium and calcium 14) Herbs and spices: Celery seed, cilantro, saffron, lemongrass, black cumin, ginseng, cinnamon, cardamom, sweet basil, and ginger 15) Chia and flax seeds- also help to lower cholesterol and blood sugar 16) Beets- high levels of nitrates that relax blood vessels  17) spinach and bananas- high in potassium  -Provided lise of supplements that can help with hypertension:  1) magnesium: one high quality brand is [employer] since it contains all 7 types of magnesium, otherwise over the counter magnesium gluconate 400mg  is a good option 2) B vitamins 3) vitamin D 4) potassium 5) CoQ10 6) L-arginine 7) Vitamin C 8) Beetroot -Educated that goal BP is 120/80. -Made goal to incorporate some of the above foods into diet.    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  -discussed that she had weaned off the steroids and had been doing  fine off the steroids -discussed that aquatic therapy can help -discussed that she should encourage follow-up with rheumatology to assess for giant cell arteritis -[facility] -continue 2mg  steroids  16. Sciatica: -consider red light therapy -discussed that this has improved -discussed aquatherapy -discussed that the spinal injection helped temporarily -discussed that she has been taking 4mg  of low dose naltrexone -discussed that she used some hydrocodone she had left over from the past.  -discussed lumbosacral orthosis, advised using for activities that involve bending/twisting/walking on uneven terrain -discussed that it would probably be better to repeat steroid injection than to continue hydrocodone -advised to use lidocaine patch -continue capsaicin cream  17. Family illness: -discussed that she lost 2 nephews recently  48. Depression:  -continue cymbalta -encouraged follow-up with psychiatry  19. Dizziness: -discussed that this has improved  20. 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: -discussed that she can hardly see out of her left eye -discussed that this is present in both eyes and her current treatments have been ineffective -encouraged discussion of red light therapy with her ophthalmologist who is a retina specialist  10 minutes spent in discussing her trigeminal neuralgia, that she needs a refill of her fentanyl patch, that the itching has improved, that she saw a pain psychologist on Tuesday and has another appointment on 2/18, discussed that I think it is phenomenal that she is doing this, discussed that her neurologist felt she needed her pain medication in addition to the stimulator

## 2023-07-27 ENCOUNTER — Encounter
Payer: Medicare Other | Attending: Physical Medicine and Rehabilitation | Admitting: Physical Medicine and Rehabilitation

## 2023-07-27 VITALS — BP 137/82 | HR 69 | Ht 62.5 in | Wt 150.0 lb

## 2023-07-27 DIAGNOSIS — G500 Trigeminal neuralgia: Secondary | ICD-10-CM | POA: Insufficient documentation

## 2023-07-27 MED ORDER — FENTANYL 25 MCG/HR TD PT72: 1.0000 | MEDICATED_PATCH | TRANSDERMAL | 0 refills | Status: DC

## 2023-07-27 MED ORDER — HYDROCODONE-ACETAMINOPHEN 5-325 MG PO TABS: 1.0000 | ORAL_TABLET | Freq: Two times a day (BID) | ORAL | 0 refills | Status: DC | PRN

## 2023-07-27 NOTE — Progress Notes (Signed)
 Subjective:    Patient ID: [name], female    DOB: 1941-05-14, 83 y.o.   MRN: [id number]  HPI   [name] presents today for f/u of severe trigeminal neuralgia.   1) Severe trigeminal neuralgia: -today pain is 8/10 -no updates since I last saw her -she is doing well with hydrocodone  and fentanyl  patch -had been unable to go to the gym when her pain was severe -she is not doing very well -they had to cancel one of the appointments due to snow -the next week [name] had the flu -she turned off the device for 2 weeks as it was making her pain worse -she had an appointment with [name] on Monday and tried new programs and they seemed to be working better -she is very happy with her new [facility] but has not found any helpful settings yet -she turns off the stimulator at night as she can sleep well at night -if she turns off the stimulator during the day she has 1 hour of relief and then the old pain returns -the last two or three days she has had bad pain -she is filling out pain diaries -she was able to cook Thanksgiving dinner but felt a lot of pain the next day -she is doing ok -she likes her new physician at [facility]. He works a lot with the [employer] rep because the rep is just for back pain -he takes pictures and asks for the location to be adjusted -at [facility] she is just given one program and then it stops working.  -Saw Dr. Colon 8 weeks ago and the new programs worked but then it stops working when she returns home -she is seeing neurosurgeon [name] on Thursday  [name] researched red light therapy -she went to see the doctor in [place]  and was only been able to spend 5 minutes with her -they did about 15 minutes of adjustments and the new programs seemed to hurt more than they help -has had stimulator turned off since last night -was able to enjoy her birthday -she did well for 3 weeks but recently pain has been around  8-10 -when pain is very severe it makes her eye hurt -the way her stimulator is set it makes her nose hurt She is going back to [facility] on April 10th -she is managing with the gabapentin  and had to take 2 extra yesterday and it did not make her sleepier  -pain fluctuates  -she went to [place] last week with her children.  -she keeps a journal of when she changes the program and what her pain level is in her teeth, eyes, and cheek. She put in not to use the teeth program -she said if they could hit the nerve between the two teeth that would be helpful -they have her an 11 and 12 in that region,  -she cannot get the right level of stimulation. Before she left she decreased it by 50%. Now she is on a cheat program and the lowest amount. It is helping the teeth but hurting the eye.  -Dr. Colon had a baby on 11/14 and she is not coming back until January.  -she talked to the representative -last night she turned off the stimulator -she slept almost day today and did not want to wake up for supper which was rare for her -she is having a difficult time weaning off the hydrocodone .  -she has been off of it in two  days -she is expecting a return call from [name] team soon to discuss wean off fentanyl  -drove up on Tuesday and on Wednesday was supposed to have an appointment with pain psychologist. When they got there they were told that appointment had been changed to virtual. The next day they had a more sophisticated MRI. [name] did moving in MRI so MRI was not as helpful. The next day they saw Dr. Colon and discussed several procedural options and then went back to the motor stimulator- that is scheduled on October 16th.  -she recently went on vacation and had to spend the majority of the vacation in bed due to the severity of her pain -she stopped using the intranasal ketamine because although it helped initially, it appeared to make her pain worse later -she asks if she can wean off some medication as  she had a recent appointment with Dr. Colon and was told she may have a lot of postoperative pain if she is taking so much pain medication. She would still like to do this and also experiences sedation from her medication. She asks how she would go about weaning her medications -she asks if she should wean off the gabapentin  since this makes her so sleepy She asks if she should restart Topamax . -she was encouraged by her phone visit with Dr. Colon and plans to pursue surgery. She is going to [facility] next week for Brain MRI, neuropsych eval, and to discuss the risks and benefits of the procedure with Dr. Colon -pain was so severe in the past that she expressed suicidality to her husband Zell.  -she feels that none of her medications are helpful but she knows that when she tries to wean them her pain worsens.  -she asks about refill of the compounding cream -her husband asks about referral to ketamine clinic for depression rather than pain -she went back to the dentist because it was urting between two teeth and he said there is nothing wrong with those teeth and it is the trigeminal nerve When she took a shower that usually seems to help with but was migrating around her face.  -appointment with neurology in July -Dr. Colon cancelled the zoom call in April but this was rescheduled to July.  -she finds that only the gabapentin  helps -she is not sure if the fentanyl  helps When she is feeling really bad she tries to take another one but she gets very sleepy and dizzy.  -she is taking 8 a day of 100mg  per day- she takes two gabapentin  and tylenol  and hydrocodone  in the morning. She takes another two at breakfast around 11, another 2 at lunch around 1:30pm.  -she had a recent root canal and pain has become much more severe after that -her pain is in the two teeth next to where she had the root canal -Oragel heps for a bout an hour.  -she tried increasing her Gabapentin  to 300mg  TID but she could not  tolerate this. It made her too sleepy.  -she contacted Dr. Darlis and he recommended she pause wean of Fentanyl  patch and I agree with this -she took last patch last night. She still has 12.5mcg patch available -her husband asks whether she should take a higher dose of gabapentin  -the fentanyl  patch makes her very sleepy and so allows her to sleep. It has also made her dizzier.  -the 50mcg fentanyl  patch did not provide more relief- she would like to wean off. Weaned to 25, then 12.5mcg, then  off this week -referral was to [facility] and not to Dr. Kirke Solders she states and they need a new referral send to Dr. Solders -she is using the compound cream and this helps- she uses this twice per day -capsaicin  made it burn  -lamotrigine  makes her too sleepy and dizzy to come down the stairs by herself and she does not feel comfortable going to the shower -left eye more open than usual. -she does not feel she is getting relief from the fentanyl  patch -she feels that the procedure at [facility] is a last resort.  -Given darkening of skin and worsening eye lid closure, MRI brain stat obtained and shows new cyst that could be post-surgical. Recommended she go to ED given worsening dizziness and facial swelling as well. ED physician discussed case with NSGY and neurology and discharged home recommending follow-up with Dr. Dartha and Dr. Tarri. She was given IV Fentanyl  in the ED which helped and discharged on 12mcg Fentanyl  patch, which has not helped.  -discussed increasing Fentanyl  patch to 25mcg since she has not experienced any negative side effects, no respiratory depression, and her husband is agreeable. I have sent in new script to start Saturday, but insurance required prior auth, which was approved.  -tonight would be the third tay of the 25mcg Fentanyl  patch -she is still taking the Gabapentin  -she feels that when she lays down she is spinning.  -eye continues to appear more  closed.  -Husband has called Dr. Sunnie and Dr. Teddi office to scheduled follow-up appointments and has appointments with both tomorrow.  -Will run out of Fentanyl  patch by Sunday -pain is worsening and nothing is providing relief -she asks whether she should go to ED -she has also noted dizziness and is not sure if this is from being in the MRI or from the Savella .  -morphine  was not effective and caused itching, sarna lotion did help wit this.  -hydrocodone  has been most effective thus far -nucynta  was not effective but she is not sure whether she gave it a fair trial -she has month appointments scheduled with Fidela and f/u with me in Feb -she used the Norco last night and this morning with good benefit and has started to feel the pain return this afternoon -she did find benefit from the compound cream- she continues to use this.  -She continues to have a sharp shooting pain doing around her eyelid and the tip teeth and in the top of her head.  -has a radiofrequency ablation treatment that helped but not completely -she is not having as much sharp knife-like pin like in her eye Her top teeth in her left law have been hurting really badly.  -she now takes the baclofen  with the gabapentin  and they seem to work well together.  -she had surgical procedure and feels worse -yesterday she took a half a tablet but this morning she took a whole one. She does fine with the hydrocodone  but a whole one makes her too sleepy.  -she is taking 2 100mg  Gabapentin  three times per day but it makes her dizzy and sleepy so she cannot funciton -the cold worsens her pain.  -The IV fentanyl  did help with the pain.  -her husband feels she should get one more dose of the 25mg  fentanyl . -she did get one week benefit from the steroid injection.  -she is doing terribly -she is miserable -she feels depressed due to the pain -she has considered taking the whole bottle of hydrocodone   because because she  can't take this pain anymore.  -she started taking the Gabapentin  300mg  TID.  -she does find relief from the hydrocodone  -the pain is constant -she wonders if it is inflammation.  -she has not tried turmeric. Her son-in-law gave her a bottle and she couldn't stomach it.   2) Fibromyalgia: -She has been almost nonfunctional due to the severity of her pain. It continues to be very severe.  -She would like to wean off the Gabapentin  as she feels at risk of falls -she is taking Topamax  BID. She felt this was helpful but made her unsteady on her feet.  -still using gabapentin  and this is helping.  -she feels so fatigued that she can only have a shower every other day.   3) Osteoporosis: -husband asks whether she should get a Prolia  shot.   4) Dizziness: -head was swimming -she felt like she was going to fall.  -worsening -feels like she is spinning, not the room  -She tried the Phenytoin  for 2 days and did not feel benefit so stopped it.   -she feels the gabapentin  aggravates this and she would love to wean off but knows it helps her.   -She feels very sleepy during the day.   -She has never tried Topirmate before.   5) Sore throat -developed stopping lamotrigine  since not helping.   Prior history:  Her pain continues to be in her left eye, forehead. She has not tried any topicals for pain. She uses capsaicin  for her back and knees.   Since last visit nothing we have tried has helped with her pain. She has tried to decrease her Gabapentin  dose but then her pain increased so she has resumed taking 4 Gabapentin  per day.   Her husband asks whether he should contact her surgeon regarding whether nerve may have become unclamped.   Discussed prolotherapy, trigger point injections, Baclofen , Clonazepam, Phenytoin  as other options we could try,=.  Pain continues to be excruciating and she states she is not sure how much longer she can live like this.   Prior history: She has having  some nausea, lightheadedness and felt this may be related to her Gabapentin . She did not take any yesterday and then felt more sever pain in the evening. She also gets electrical shooting across her jaw.   She does not feel that she has cluster headaches as the pain has been so constant. She feels that she is getting worse. The oxygen made her feel relaxed but it is not helping.   The Sarna lotion does help with the itching in her head.   She was willing to try the Amitriptyline  but it makes her too sleepy in the morning.   Her teeth and left eye incredibly hurt.   She feels that the Gabapentin  has been making her dizziness.   She asks about biofeedback.   The pain is 7/10 on average, similar to last time.   When the pain is severe it can make her depressed.    6) Macular degeneration:  Has been following with ophthalmology  7) Family illness: -she lost two nephews recently  Pain Inventory Average pain 8 Pain Right Now 8 My pain is constant, sharp, burning, dull, stabbing, and aching  In the last 24 hours, has pain interfered with the following? General activity 9 Relation with others 9 Enjoyment of life 9  What TIME of day is your pain at its worst? morning  and daytime Sleep (in general) Good  Pain  is worst with  bending & standing, walking, sitting, inactivity, some activities  Pain improves with rest, heat, medication, therapy, injections  Relief with medication is 1   Family History  Problem Relation Age of Onset   Heart disease Mother    Heart disease Father    Stroke Father    Stroke Sister    Asthma Daughter    Colon cancer Neg Hx    Social History   Socioeconomic History   Marital status: Married    Spouse name: Not on file   Number of children: 2   Years of education: college   Highest education level: Not on file  Occupational History   Occupation: housewife    Employer: RETIRED  Tobacco Use   Smoking status: Never   Smokeless tobacco:  Never  Vaping Use   Vaping status: Never Used  Substance and Sexual Activity   Alcohol use: No    Alcohol/week: 0.0 standard drinks of alcohol   Drug use: No   Sexual activity: Not on file  Other Topics Concern   Not on file  Social History Narrative   1 cup of Jasmine tea daily    Social Drivers of Health   Financial Resource Strain: Low Risk  (05/08/2023)   Received from [facility]   Overall Financial Resource Strain (CARDIA)    Difficulty of Paying Living Expenses: Not hard at all  Food Insecurity: No Food Insecurity (05/08/2023)   Received from [facility]   Hunger Vital Sign    Worried About Running Out of Food in the Last Year: Never true    Ran Out of Food in the Last Year: Never true  Transportation Needs: No Transportation Needs (05/08/2023)   Received from [facility] - Transportation    In the past 12 months, has lack of transportation kept you from medical appointments or from getting medications?: No    Lack of Transportation (Non-Medical): No  Physical Activity: Inactive (11/13/2019)   Received from [facility] visits prior to 08/22/2022., [facility] visits prior to 08/22/2022.   Exercise Vital Sign    Days of Exercise per Week: 0 days    Minutes of Exercise per Session: 0 min  Stress: No Stress Concern Present (11/13/2019)   Received from [facility] visits prior to 08/22/2022., [facility] visits prior to 08/22/2022.   [employer] of Occupational Health - Occupational Stress Questionnaire    Feeling of Stress : Not at all  Social Connections: Socially Integrated (11/13/2019)   Received from [facility] visits prior to 08/22/2022., [facility] visits prior to 08/22/2022.   Social Advertising Account Executive [NHANES]    Frequency of Communication with Friends and Family:  More than three times a week    Frequency of Social Gatherings with Friends and Family: More than three times a week    Attends Religious Services: 1 to 4 times per year    Active Member of [employer] or Organizations: Yes    Attends Banker Meetings: 1 to 4 times per year    Marital Status: Married   Past Surgical History:  Procedure Laterality Date   CHOLECYSTECTOMY  03/2010   COLONOSCOPY  2009   Dr. Author    CYSTOSCOPY     Multiple Cystoscopies with stents or biopsy    ESOPHAGOGASTRODUODENOSCOPY  10/2013   EYE  SURGERY     bilateral cataract removal   Gamma knife radiation  01/2018   for trigeminal neuralgia   REFRACTIVE SURGERY Bilateral 2022   RHIZOTOMY Left 04/28/2021   Procedure: Left Percutaneous trigeminal balloon rhizotomy;  Surgeon: Cheryle Debby LABOR, MD;  Location: [facility] OR;  Service: Neurosurgery;  Laterality: Left;   TONSILLECTOMY     Past Surgical History:  Procedure Laterality Date   CHOLECYSTECTOMY  03/2010   COLONOSCOPY  2009   Dr. Author    CYSTOSCOPY     Multiple Cystoscopies with stents or biopsy    ESOPHAGOGASTRODUODENOSCOPY  10/2013   EYE SURGERY     bilateral cataract removal   Gamma knife radiation  01/2018   for trigeminal neuralgia   REFRACTIVE SURGERY Bilateral 2022   RHIZOTOMY Left 04/28/2021   Procedure: Left Percutaneous trigeminal balloon rhizotomy;  Surgeon: Cheryle Debby LABOR, MD;  Location: [facility] OR;  Service: Neurosurgery;  Laterality: Left;   TONSILLECTOMY     Past Medical History:  Diagnosis Date   Adenomatous colon polyp 03/24/2018   Allergy    Arthritis    Cataract    Chronic headaches    Depression    Fibromyalgia    Gallstones    GERD (gastroesophageal reflux disease)    Hyperlipemia    Hypertension    Interstitial cystitis    Lactose intolerance    LBBB (left bundle branch block)    Osteoarthritis    Osteopenia    Pneumonia    Thyroid  disease    Tinnitus    Trigeminal neuralgia    BP 137/82    Pulse 69   Ht 5' 2.5 (1.588 m)   Wt 150 lb (68 kg)   SpO2 94%   BMI 27.00 kg/m   Opioid Risk Score:   Fall Risk Score:  `1  Depression screen [facility] 2/9     05/25/2023   11:27 AM 02/23/2023   11:21 AM 12/28/2022    1:35 PM 10/27/2022    2:05 PM 09/15/2022    1:25 PM 07/21/2022    1:38 PM 05/19/2022    1:11 PM  Depression screen PHQ 2/9  Decreased Interest 0 0 0 0 0 1 0  Down, Depressed, Hopeless 0 0 0 0 0 1 0  PHQ - 2 Score 0 0 0 0 0 2 0    Review of Systems  Constitutional: Negative.   HENT: Negative.    Eyes:  Positive for visual disturbance.  Respiratory: Negative.    Cardiovascular: Negative.   Gastrointestinal: Negative.   Endocrine: Negative.   Genitourinary: Negative.   Musculoskeletal: Negative.        Pain in left side of face  Skin: Negative.   Allergic/Immunologic: Negative.   Neurological:  Positive for dizziness, tremors, light-headedness and numbness.       Tingling   Hematological: Negative.   Psychiatric/Behavioral: Negative.    All other systems reviewed and are negative.      Objective:   Physical Exam Gen: no distress, normal appearing HEENT: oral mucosa pink and moist, NCAT Cardio: Reg rate Chest: normal effort, normal rate of breathing Abd: soft, non-distended Ext: no edema Psych: pleasant, normal affect Skin: intact Neuro: Alert and oriented x3, left eye drop . Assessment & Plan:  Mrs. Goodnough is an 83 year old woman who presents for f/u of left sided trigeminal neuralgia and dizziness.    1) Sensory deafferation pain, left sided trigeminal neuralgia vs cluster headaches (she does have lacrimation and runny nose on that  side). -discussed that she has been happy with her new provider at [facility] -d/c narcan  and naltrexone, discussed that she uses the hydrocodone  sparingly.  -discussed that she had new programs installed on Monday -discussed that she is doing well with the hydrocodone  -discussed pruritus with the fentanyl  patch -continue  following with pain psychologist, discussed that I think it is phenomenal that she is doing this -refilled hydrocodone  and fentanyl  -discussed rating her pain for the programming of electrical stimulation   -discussed mechanism of action of low dose naltrexone as an opioid receptor antagonist which stimulates your body's production of its own natural endogenous opioids, helping to decrease pain. Discussed that it can also decrease T cell response and thus be helpful in decreasing inflammation, and symptoms of brain fog, fatigue, anxiety, depression, and allergies. Discussed that this medication needs to be compounded at a compounding pharmacy and can more expensive. Discussed that I usually start at 1mg  and if this is not providing enough relief then I titrate upward on a monthly basis.    -discussed that savella  caused hallucinations -discussed that we tried lyrica  and we cannot recall but she had some side effect -discussed that topamax  did not help and made her feel worse -discussed that carbamazpeine did not help -discussed that she is happy that she does not have to return to [place] as those trips caused her a lot of fatigue -discussed that pain was bad after she cooked for Thanksgiving -discussed her follow-up with [name] -discussed the negative experience when she last went to [place]  -discussed that next available appointment was on June 24th or 26th, but they were able to move this up to May 22nd or 24th -discussed that she keeps a journal with the setting with the level of her pain.  -continue Diclofenac 3%, Gabapentin  5%, Lidocaine  5%, Menthol 1% compounded at [facility]:  apply 1-2 pumps three to four times per day as needed, discussed that this dose help -discussed that she has had to turn off the stimulator more since she has established care at [facility] -discussed response to her stimulator -discussed doubling the gabapentin  when pain is severe Increase low dose naltrexone to  3mg  HS -discussed her response to the stimulator She does have a history of migraines.  -Discussed that if she has to be in bed all day due to her pain it may be beneficial for her husband to give her at least one hydrocodone  per day to enable her to function -provided refill of Fentanyl  patch as she will be leaving for her surgery in [place] soon and her husband plans to stay there for some time in case she has any surgical complications.  -discussed her follow-up with pain psychology.  -recommended weaning off Fentanyl , discussed stopping fentanyl  because she is on 12.5mg  today but she had tried this a couple of weeks ago and pain was so severe that she needed patch to be replaced.  -discussed that she is taking hydrocodone  4 tablets per day, and recommend to decrease this to 3 tabs per day for three days and then to keep decreasing the tab by once per day.  -discussed that skipping the morning dose of hydrocodone  will at least allow her to sleep -recommended trying Frankincense and Myrrh essential oils mixed in coconut oil and applied to area of pain -recommended restarting Topamax  -called [facility] to prescribe compounding gel refill -continue Gabapentin  100mg  up to 8 times a day, discussed her current schedule of takin -discussed using Oragel multiple  times per ay since this helps for an hour, use the topical cream intermittently in between -continue follow-up with [facility] to set up an appointment for her with them. Unfortunately Dr. Zulema has retired but was able to set her up with Dr. Binnie who specializes in migraines and trigeminal neuralgia on July 25th. Let patient and husband know. They will mail her their address.   -commended her for calling Dr. Darlis as well to let him know how she is feeling -recommended taking 1 hydrocodone  now for the severe pain, and discussed that the Fentanyl  patch with take 12-24 hours to kick in -discussed with April the neurology  referral and she did specify Dr. Kirke Zulema and spoke with the [facility] department -discussed CBD oil, her son bought her some of this and she felt that it greatly helped  Recommend topical CBD oil- discussed its benefits in reducing inflammation, pain, insomnia, and anxiety, but she stopped because effect wore of.  -Discussed that CBD oil differs from marijuana in that it does not contain THC- the substance that causes euphoria.  -Discussed that it is made from the hemp plant.  -It has been used for thousands of years -preliminary research suggests that if may be able to shrink cancerous tumors, stop plaque formation in Alzheimer's Disease, and slow the progress of brain disease from concussions.  -Additional benefits that have been demonstrated in studies include improved nausea, indigestion, and brain health, and reduced seizures.  -In a survey 92% of patients who tried medical cannabis felt it improved symptoms such as chronic pain, arthritis, migraines, and cancer.   -Provided with a pain relief journal and discussed that it contains foods and lifestyle tips to naturally help to improve pain. Discussed that these lifestyle strategies are also very good for health unlike some medications which can have negative side effects. Discussed that the act of keeping a journal can be therapeutic and helpful to realize patterns what helps to trigger and alleviate pain.   -discussed with Dr. Saul trigeminal deafferation pain, discussed the difference between this and trigeminal neuralgia with patient and husband, discussed motor cortex stimulation. -discussed whether or not to continue fentanyl  patch, husband does feel that the IV fentanyl   -encouraged following with Dr. Sweet/Dr. Cleotilde at [facility] to try procedure recommended by D. Ostegard.  -discussed mechanism of action of low dose naltrexone as an opioid receptor antagonist which stimulates your body's production of its own natural  endogenous opioids, helping to decrease pain. Discussed that it can also decrease T cell response and thus be helpful in decreasing inflammation, and symptoms of brain fog, fatigue, anxiety, depression, and allergies. Discussed that this medication needs to be compounded at a compounding pharmacy and can more expensive. Discussed that I usually start at 1mg  and if this is not providing enough relief then I titrate upward on a monthly basis.   -stop lamotrigine  as it is making her dizzy without benefit -reviewed patient's medical notes -discussed her progress with getting a referral to [facility] -recommended going to ED given darkening of skin, worsening eye closure, finding of cyst, dizziness to receive prompt neurosurgical eval regarding if surgery is necessary, and also for pain management given worsening pain refractory to all treatments and resulting suicidality. Reviewed ED doc notes- he discussed with on-call neurology and NSGY and determined that patient could be discharged with outpatient follow-up. IV fentanyl  was administered with relief to patient. She was discharged on fentanyl  patch which has not provided relief. She is  getting 12mcg- no side effects- discussed increasing to 25mcg and her husband is in agreement. Prior auth completed and approved. Discussed with husband starting patch on Satruday after she has worn 12mcg patch for 72 hours -encouraged follow-up with Dr. Saul, husband let me know appointment has been set up 1/18.  -recommended follow-up with Dr. 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  -Discussed that turmeric  -discussed steroid taper. -continue savella ,  husband feels like it is helping with her suicidal thoughts. Increase to 25mg .  Discontinued hydrocodone   -discuss mouth guard with dentist.  -d/c topamax  -she does have another procedure scheduled. -refilled Norco -follow-up monthly with Fidela and with me in February, placed on waitlist for earlier appointment with me, advised phone visits/MyChart are a great way to communicate in the interim -discussed that she can take an additional one or two tylenol  for breakthrough pain -recommended checking CMP for liver enzymes with next labs -prescribed NAC 600mg  BID to protect the liver while taking tylenol , discussed its health benefits for mitochondrial function -d/c Baclofen   -f/u with Dr. Saul and Dr. Darlis 1/18 -discussed retrying carbamazepine  to see if it is effective for her. It is $89 for her, so we discussed trying Lamictal  instead.  -encouraged anti-inflammatory diet.  -she had sinusitis that was viral and was treated with antibiotics. This was in the early 90s. The symptoms resolved after 2 months. She uses to get sinus infections and saline rinse.  -She has had multiple root canals.  -Discontinue 100% oxygen via facemask as this was not helpful.  -Discussed that unfortunately Sprint PNS is not indicated for craniofacial pain. -Amitriptyline  and Lyrica  did not help.  -Continue Cymbalta  60mg .  -Topiramate  helped but caused dizziness.  -Provided with hand out with information regarding trigeminal neuralgia -Failed Penytoin.  -trial lidocaine  patch -She would like to try biofeedback. -reviewed neurology note.  -referred to Dr. Kirke Solders.  -will try trigger point injections next visit.  -Continue Ketamine 10%, Baclofen  2%, Cyclobenzaprine 2%, Ketoprofen 10%, Gabapentin  6%, Bupivacaine 1%, Amitryptiline 5% to apply to painful areas- using 3-4 times per day.  -Can consider Meloxicam if Phenytoin  does not help.  -Discussed Qutenza  as an option for neuropathic pain control.  Discussed that this is a capsaicin  patch, stronger than capsaicin  cream. Discussed that it is currently approved for diabetic peripheral neuropathy and post-herpetic neuralgia, but that it has also shown benefit in treating other forms of neuropathy. Provided patient with link to site to learn more about the patch: [email or web address] .[email or web address]/. Discussed that the patch would be placed in office and benefits usually last 3 months. Discussed that unintended exposure to capsaicin  can cause severe irritation of eyes, mucous membranes, respiratory tract, and skin, but that Qutenza  is a local treatment and does not have the systemic side effects of other nerve medications. Discussed that there may be pain, itching, erythema, and decreased sensory function associated with the application of Qutenza . Side effects usually subside within 1 week. A cold pack of analgesic medications can help with these side effects. Blood pressure can also be increased due to pain associated with administration of the patch.  -she cut down on her gabapentin  due to her dizziness    Turmeric to reduce inflammation--can be used in cooking  or taken as a supplement.  Benefits of turmeric:  -Highly anti-inflammatory  -Increases antioxidants  -Improves memory, attention, brain disease  -Lowers risk of heart disease  -May help prevent cancer  -Decreases pain  -Alleviates depression  -Delays aging and decreases risk of chronic disease  -Consume with black pepper to increase absorption    Turmeric Milk Recipe:  1 cup milk  1 tsp turmeric  1 tsp cinnamon  1 tsp grated ginger (optional)  Black pepper (boosts the anti-inflammatory properties of turmeric).  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  -she is a good metabolizer of amitriptyline  and has tolerated nortrypilline in the past, but without much benefit.    3) CKD: encouraged 6-8 glasses of water per day. She states that she had AKI with Ibuprofen in the past. Discussed checking Cr level next visit if we are considering Meloxicam -Discussed that Nucynta , and other pain medications, may stay in her bloodstream longer given her CKD   4) Hypernatremia: encouraged hydration   5) General health and pain: provided with list of healthy foods that are both nutritious and fight pain.   6) Depressed: Amitriptyline  did help with this but she could not tolerate the drowsiness. Encouraged focusing on the factors that she can control, such as anti-inflammatory diet.   7) Fibromyalgia: Discussed goal to wean steroids prior to surgery. Continue 1mg  daily. She is on that intermittently. She started this again about a month ago. She has been on this about a year. Continue Savella  -discussed benefits of cold water showers.  -continue gabapentin .  -high dose vitamin D  sent  8) Dizziness: -discussed that this could be from the combination of gabapentin  and baclofen .  -discussed that all the medications we are trying for pain may worsen the dizziness  9) Constipation:  -Provided list of following foods that help with constipation and highlighted a few: 1) prunes- contain high amounts of fiber.  2) apples- has a form of dietary fiber called pectin that accelerates stool movement and increases beneficial gut bacteria 3) pears- in addition to fiber, also high in fructose and sorbitol which  have laxative effect 4) figs- contain an enzyme ficin which helps to speed colonic transit 5) kiwis- contain an enzyme actinidin that improves gut motility and reduces constipation 6) oranges- rich in pectin (like apples) 7) grapefruits- contain a flavanol naringenin which has a laxative effect 8) vegetables- rich in fiber and also great sources of folate, vitamin C, and K 9) artichoke- high in inulin, prebiotic great for the microbiome 10) chicory- increases stool frequency and softness (can be added to coffee) 11) rhubarb- laxative effect 12) sweet potato- high fiber 13) beans, peas, and lentils- contain both soluble and insoluble fiber 14) chia seeds- improves intestinal health and gut flora 15) flaxseeds- laxative effect 16) whole grain rye bread- high in fiber 17) oat bran- high in soluble and insoluble fiber 18) kefir- softens stools -recommended to try at least one of these  foods every day.  -drink 6-8 glasses of water per day -walk regularly, especially after meals.   10) Osteopenia - was tapered off the prednisone  -discussed that currently her pain may be more of a risk to her than osteopenia given her suicidal ideation from her pain -discussed risk of fall with the medications that make her dizziness.   11) Daytime sleepiness -discussed that this is secondary to her medications.  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  -discussed red light therapy  15) HTN: -discussed clonidine  patch could potentially help with HTN and pain.  -BP is 170/90 today.  -Advised checking BP daily at home and logging results to bring into follow-up appointment with PCP and myself. -Reviewed BP meds today.  -Advised regarding healthy foods that can help lower blood pressure and  provided with a list: 1) citrus foods- high in vitamins and minerals 2) salmon and other fatty fish - reduces inflammation and oxylipins 3) swiss chard (leafy green)- high level of nitrates 4) pumpkin seeds- one of the best natural sources of magnesium 5) Beans and lentils- high in fiber, magnesium, and potassium 6) Berries- high in flavonoids 7) Amaranth (whole grain, can be cooked similarly to rice and oats)- high in magnesium and fiber 8) Pistachios- even more effective at reducing BP than other nuts 9) Carrots- high in phenolic compounds that relax blood vessels and reduce inflammation 10) Celery- contain phthalides that relax tissues of arterial walls 11) Tomatoes- can also improve cholesterol and reduce risk of heart disease 12) Broccoli- good source of magnesium, calcium , and potassium 13) Greek yogurt: high in potassium and calcium  14) Herbs and spices: Celery seed, cilantro, saffron, lemongrass, black cumin, ginseng, cinnamon, cardamom, sweet basil, and ginger 15) Chia and flax seeds- also help to lower cholesterol and blood sugar 16) Beets- high levels of nitrates that relax blood vessels  17) spinach and bananas- high in potassium  -Provided lise of supplements that can help with hypertension:  1) magnesium: one high quality brand is Bi[employer]emizers since it contains all 7 types of magnesium, otherwise over the counter magnesium gluconate 400mg  is a good option 2) B vitamins 3) vitamin D  4) potassium 5) CoQ10 6) L-arginine 7) Vitamin C 8) Beetroot -Educated that goal BP is 120/80. -Made goal to incorporate some of the above foods into diet.    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  -discussed that she had weaned off the steroids and had been doing fine off the steroids -discussed that aquatic therapy can help -discussed that she should encourage follow-up with rheumatology to assess for giant cell arteritis -[facility] -continue 2mg  steroids  16. Sciatica: -consider red light therapy -discussed that this has improved -discussed aquatherapy -discussed that the spinal injection helped temporarily -discussed that she has been taking 4mg  of low dose naltrexone -discussed that she used some hydrocodone  she had left over from the past.  -discussed lumbosacral orthosis, advised using for activities that involve bending/twisting/walking on uneven terrain -discussed that it would probably be better to repeat steroid injection than to continue hydrocodone  -advised to use lidocaine  patch -continue capsaicin  cream  17. Family illness: -discussed that she lost 2 nephews recently  53. Depression:  -continue cymbalta  -encouraged follow-up with psychiatry   19. Dizziness: -discussed that this has  improved  20. 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

## 2023-08-03 DIAGNOSIS — H353112 Nonexudative age-related macular degeneration, right eye, intermediate dry stage: Secondary | ICD-10-CM | POA: Diagnosis not present

## 2023-08-03 DIAGNOSIS — H353123 Nonexudative age-related macular degeneration, left eye, advanced atrophic without subfoveal involvement: Secondary | ICD-10-CM | POA: Diagnosis not present

## 2023-08-03 DIAGNOSIS — H43813 Vitreous degeneration, bilateral: Secondary | ICD-10-CM | POA: Diagnosis not present

## 2023-08-03 DIAGNOSIS — H35033 Hypertensive retinopathy, bilateral: Secondary | ICD-10-CM | POA: Diagnosis not present

## 2023-08-03 DIAGNOSIS — H35372 Puckering of macula, left eye: Secondary | ICD-10-CM | POA: Diagnosis not present

## 2023-08-10 DIAGNOSIS — F451 Undifferentiated somatoform disorder: Secondary | ICD-10-CM | POA: Diagnosis not present

## 2023-08-19 ENCOUNTER — Telehealth: Payer: Self-pay | Admitting: Physical Medicine and Rehabilitation

## 2023-08-19 ENCOUNTER — Other Ambulatory Visit: Payer: Self-pay | Admitting: Physical Medicine and Rehabilitation

## 2023-08-19 MED ORDER — FENTANYL 25 MCG/HR TD PT72: 1.0000 | MEDICATED_PATCH | TRANSDERMAL | 0 refills | Status: DC

## 2023-08-19 NOTE — Telephone Encounter (Signed)
 [name] from [place] pharmacy called in requesting a call back need updated diagnosis codes on scripts sent in today  Ph [phone]

## 2023-08-20 NOTE — Telephone Encounter (Signed)
 Fax sent to pharmacy with dx code.

## 2023-08-30 ENCOUNTER — Other Ambulatory Visit: Payer: Self-pay | Admitting: Physical Medicine and Rehabilitation

## 2023-08-30 ENCOUNTER — Ambulatory Visit: Payer: Medicare Other | Admitting: Orthopedic Surgery

## 2023-08-30 DIAGNOSIS — G509 Disorder of trigeminal nerve, unspecified: Secondary | ICD-10-CM | POA: Diagnosis not present

## 2023-08-30 DIAGNOSIS — Z9682 Presence of neurostimulator: Secondary | ICD-10-CM | POA: Diagnosis not present

## 2023-08-30 MED ORDER — HYDROCODONE-ACETAMINOPHEN 5-325 MG PO TABS: 1.0000 | ORAL_TABLET | Freq: Two times a day (BID) | ORAL | 0 refills | Status: DC | PRN

## 2023-08-31 DIAGNOSIS — E039 Hypothyroidism, unspecified: Secondary | ICD-10-CM | POA: Diagnosis not present

## 2023-09-02 ENCOUNTER — Telehealth: Payer: Self-pay

## 2023-09-02 NOTE — Telephone Encounter (Signed)
 VOB submitted for Monovisc, bilateral knee

## 2023-09-03 ENCOUNTER — Other Ambulatory Visit: Payer: Self-pay

## 2023-09-03 DIAGNOSIS — M170 Bilateral primary osteoarthritis of knee: Secondary | ICD-10-CM

## 2023-09-06 ENCOUNTER — Ambulatory Visit (INDEPENDENT_AMBULATORY_CARE_PROVIDER_SITE_OTHER): Admitting: Orthopedic Surgery

## 2023-09-06 ENCOUNTER — Encounter: Payer: Self-pay | Admitting: Orthopedic Surgery

## 2023-09-06 ENCOUNTER — Telehealth: Payer: Self-pay

## 2023-09-06 DIAGNOSIS — M170 Bilateral primary osteoarthritis of knee: Secondary | ICD-10-CM | POA: Diagnosis not present

## 2023-09-06 NOTE — Telephone Encounter (Signed)
 Auth needed for repeat bil gel in 6 mo

## 2023-09-06 NOTE — Progress Notes (Unsigned)
   Procedure Note  Patient: [name]             Date of Birth: 04-27-1941           MRN: [id number]             Visit Date: 09/06/2023  Procedures: Visit Diagnoses:  1. Primary osteoarthritis of both knees     Large Joint Inj: bilateral knee on 09/06/2023 7:12 PM Indications: pain, joint swelling and diagnostic evaluation Details: 18 G 1.5 in needle, superolateral approach  Arthrogram: No  Medications (Right): 88 mg Hyaluronan 88 MG/4ML Medications (Left): 88 mg Hyaluronan 88 MG/4ML Outcome: tolerated well, no immediate complications Procedure, treatment alternatives, risks and benefits explained, specific risks discussed. Consent was given by the patient. Immediately prior to procedure a time out was called to verify the correct patient, procedure, equipment, support staff and site/side marked as required. Patient was prepped and draped in the usual sterile fashion.     Lot #[id number]

## 2023-09-07 DIAGNOSIS — H353123 Nonexudative age-related macular degeneration, left eye, advanced atrophic without subfoveal involvement: Secondary | ICD-10-CM | POA: Diagnosis not present

## 2023-09-08 ENCOUNTER — Other Ambulatory Visit: Payer: Self-pay | Admitting: Physical Medicine and Rehabilitation

## 2023-09-08 NOTE — Telephone Encounter (Signed)
 Noted.   Will submit August, 2025 Next available gel injection will need to be after 03/08/2024.

## 2023-09-09 MED ORDER — HYALURONAN 88 MG/4ML IX SOSY
88.0000 mg | PREFILLED_SYRINGE | INTRA_ARTICULAR | Status: AC | PRN
Start: 2023-09-06 — End: 2023-09-06
  Administered 2023-09-06: 88 mg via INTRA_ARTICULAR

## 2023-09-09 MED ORDER — FENTANYL 25 MCG/HR TD PT72: 1.0000 | MEDICATED_PATCH | TRANSDERMAL | 0 refills | Status: DC

## 2023-09-13 DIAGNOSIS — M810 Age-related osteoporosis without current pathological fracture: Secondary | ICD-10-CM | POA: Diagnosis not present

## 2023-09-21 DIAGNOSIS — F4542 Pain disorder with related psychological factors: Secondary | ICD-10-CM | POA: Diagnosis not present

## 2023-09-24 DIAGNOSIS — H3530 Unspecified macular degeneration: Secondary | ICD-10-CM | POA: Diagnosis not present

## 2023-09-27 ENCOUNTER — Other Ambulatory Visit: Payer: Self-pay | Admitting: Physical Medicine and Rehabilitation

## 2023-09-27 ENCOUNTER — Encounter: Payer: Medicare Other | Admitting: Physical Medicine and Rehabilitation

## 2023-09-27 DIAGNOSIS — Z9682 Presence of neurostimulator: Secondary | ICD-10-CM | POA: Diagnosis not present

## 2023-09-27 DIAGNOSIS — G509 Disorder of trigeminal nerve, unspecified: Secondary | ICD-10-CM | POA: Diagnosis not present

## 2023-09-27 MED ORDER — FENTANYL 25 MCG/HR TD PT72: 1.0000 | MEDICATED_PATCH | TRANSDERMAL | 0 refills | Status: DC

## 2023-09-29 ENCOUNTER — Ambulatory Visit (HOSPITAL_BASED_OUTPATIENT_CLINIC_OR_DEPARTMENT_OTHER): Payer: Medicare Other | Admitting: Pulmonary Disease

## 2023-09-29 ENCOUNTER — Encounter (HOSPITAL_BASED_OUTPATIENT_CLINIC_OR_DEPARTMENT_OTHER): Payer: Self-pay | Admitting: Pulmonary Disease

## 2023-09-29 VITALS — BP 118/72 | HR 71 | Ht 62.5 in | Wt 145.1 lb

## 2023-09-29 DIAGNOSIS — R5381 Other malaise: Secondary | ICD-10-CM

## 2023-09-29 DIAGNOSIS — R0609 Other forms of dyspnea: Secondary | ICD-10-CM | POA: Diagnosis not present

## 2023-09-29 DIAGNOSIS — R942 Abnormal results of pulmonary function studies: Secondary | ICD-10-CM

## 2023-09-29 DIAGNOSIS — R29898 Other symptoms and signs involving the musculoskeletal system: Secondary | ICD-10-CM

## 2023-09-29 NOTE — Progress Notes (Signed)
 Synopsis: Referred in 06/2018 for Reduced DLCO  Subjective:   PATIENT ID: [name] GENDER: female DOB: 15-Nov-1940, MRN: [id number]   HPI  Chief Complaint  Patient presents with   Follow-up    Shortness of breath, pt states she is feeling pretty good    [name] is a 83 year old female never smoker with fibromyalgia previously on 5 years low prednisone  2.5 mg, hx trigemina neuralgia s/p gamma knife who presents for follow-up.  Synopsis: Initially presented for consult in 06/2018 for gradually worsening dyspnea x 6 months that is aggravated with stairs and exercise. Previously played tennis in the spring and fall however since the pandemic no longer is active at baseline. PFTs in 07/2018 with moderately reduced gas exchange. Work-up included TTE with mild diastolic heart dysfunction and VQ scan negative for ventilation or perfusion defects. She was prescribed xoponex however did not tolerate due to tremors. She was lost to follow-up from 2020 until early 2023.  07/28/21 Since our last visit, she has not had any worsening shortness of breath. Occurs with walking up stairs. Has tried albuterol  in the past that was ineffective. No longer play tennis or swims since COVID. Her husband is present and provides additional history. She has been in bed 18 hours a day on some days. She has been undergoing treatment for trigemina neuralgia that has been unsuccessful including chronic pain management. She has been referred to [facility].   08/26/21 Husband is present for this visit as well. She reports unchanged shortness of breath. She has not been doing steps since our last appointment. Her attention has recently been focused on management for her trigemina neuralgia and are planning for evaluation with [facility] soon.  09/24/22 Husband is present for visit. Since her cortex stimulator generator implant for her trigemina neuralgia at [facility]. She continues to have  shortness of breath including when walking upstairs. Denies wheezing or coughing. She graduated from pulmonary rehab in summer 2023. She is currently in physical therapy for her knee. Would be interested to participated in water aerobics once she finishes physical therapy.  09/29/23 Husband present for this visit. Since our last visit she has been struggling with her trigemina neuralgia pain. Regarding her shortness of breath she has participated in physical therapy and water therapy for 6 sessions. This has helped and mainly with occurs with walking up steps. Has not been keeping up with home exercises.  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 Lived in [place] for 3 years, used coal heating  Allergies  Allergen Reactions   Dipyridamole Other (See Comments)    Migraines  Other reaction(s): migraine  Persantine  Other reaction(s): Headache  Migraines    Migraines  Other reaction(s): migraine Persantine Other reaction(s): migraine Other reaction(s): Headache Other reaction(s): migraine  Migraines  Migraines  Other reaction(s): migraine Persantine Other reaction(s): migraine Other reaction(s): Headache Other reaction(s): migraine    Migraines    Migraines  Other reaction(s): migraine Persantine Other reaction(s): migraine Other reaction(s): Headache Other reaction(s): migraine   Ciprofloxacin Other (See Comments)    Interstitial cystitis, worsening renal function   Diclofenac Other (See Comments)    Other Reaction(s): hives   Diclofenac Sodium Other (See Comments) and Hives    Other reaction(s): hives Other reaction(s): hives Other reaction(s): hives   Hydrochlorothiazide  Nausea Only    Chills  Taking at this time lower dose  Chills Taking at this time  lower dose  Chills Taking at this time lower dose    Chills Taking at this time lower dose   Other Other (See Comments)    Cat and Dog  Dander  Other reaction(s): bladder pain  Other reaction(s): muscle cramps  Other reaction(s): Unknown  Cat and Dog Dander  Other reaction(s): bladder pain Other reaction(s): muscle cramps Other reaction(s): Unknown  Other Reaction(s): Other (See Comments)  Cat and dog dander/reaction is runny nose sneezing   Oxycodone Hcl Other (See Comments)    Other reaction(s): bladder pain  Other Reaction(s): other   Pantoprazole  Other (See Comments)    Other reaction(s): increased tremors Other reaction(s): increased tremors Other reaction(s): increased tremors  Other Reaction(s): Other (See Comments)  Reaction: Tremor (intolerance)   Pantoprazole  Sodium Other (See Comments)    Other reaction(s): increased tremors  Other Reaction(s): increased tremors   Triamterene Other (See Comments)    Made Tremors worse  Other reaction(s): Other (See Comments)  Made Tremors worse Other reaction(s): Other (See Comments)   Triamterene-Hctz Other (See Comments)    Other reaction(s): muscle cramps   Buprenorphine Other (See Comments)    "knocks me out"  Other reaction(s): Unknown  Butrans  Other Reaction(s): Drowsiness, Not available, Unknown  "knocks me out"    "knocks me out" Other reaction(s): Unknown Butrans Other reaction(s): Unknown Other reaction(s): Unknown   Codeine Nausea Only and Other (See Comments)    Made her feel loopy  Other reaction(s): nausea  Other Reaction(s): GI Intolerance, Other (See Comments), Unknown  Made her feel loopy Other reaction(s): nausea Other reaction(s): nausea  Made her feel loopy Other reaction(s): nausea Other reaction(s): nausea    Made her feel loopy Other reaction(s): nausea Other reaction(s): nausea   Diclofenac Sodium Hives, Other (See Comments) and Rash    Other reaction(s): hives Other reaction(s): hives Other reaction(s): hives  Other reaction(s): hives Other reaction(s): hives Other reaction(s): hives    Other reaction(s): hives Other  reaction(s): hives Other reaction(s): hives   Doxycycline Other (See Comments) and Rash    Other reaction(s): rash  Other reaction(s): rash Other reaction(s): rash Other reaction(s): rash  Other reaction(s): rash Other reaction(s): rash Other reaction(s): rash    Other reaction(s): rash Other reaction(s): rash Other reaction(s): rash   Esomeprazole Other (See Comments)    Jittery, sore throat  Other reaction(s): jittery, sore throat  Other reaction(s): Other  Other Reaction(s): jittery, sore throat  Jittery, sore throat Jittery, sore throat    Jittery, sore throat Other reaction(s): jittery, sore throat Other reaction(s): jittery, sore throat Other reaction(s): Other Other reaction(s): jittery, sore throat  Jittery, sore throat Jittery, sore throat  Jittery, sore throat Other reaction(s): jittery, sore throat Other reaction(s): jittery, sore throat Other reaction(s): Other Other reaction(s): jittery, sore throat    Jittery, sore throat Jittery, sore throat    Jittery, sore throat Other reaction(s): jittery, sore throat Other reaction(s): jittery, sore throat Other reaction(s): Other Other reaction(s): jittery, sore throat  Other Reaction(s): Other (See Comments)  Jittery, sore throat, Jittery, sore throat   Hydrochlorothiazide -Triamterene Hives, Other (See Comments) and Rash    Muscle Cramps  Other reaction(s): muscle cramps  Other reaction(s): Other  Other Reaction(s): muscle cramps  Cramps Cramps    Muscle Cramps  Other reaction(s): muscle cramps Other reaction(s): Other  Other Reaction(s): Other (See Comments)  Cramps, Cramps   Irbesartan  Diarrhea, Other (See Comments) and Rash    Other reaction(s): loose stools  Other Reaction(s): loose stools  Other reaction(s): loose  stools Other reaction(s): loose stools Other reaction(s): loose stools Other reaction(s): loose stools  Other reaction(s): loose stools Other reaction(s): loose stools Other reaction(s): loose  stools Other reaction(s): loose stools    Other reaction(s): loose stools Other reaction(s): loose stools Other reaction(s): loose stools Other reaction(s): loose stools   Lactose Other (See Comments)    GI upset   Lactose Intolerance (Gi) Other (See Comments)    GI upset    Lactulose Other (See Comments), Hives and Rash    GI upset   Losartan Hives, Other (See Comments) and Rash    Hot and chills  Other reaction(s): hot and chills  Other reaction(s): flushing  Other Reaction(s): Fever, hot and chills  Hot and chills Hot and chills    Hot and chills Other reaction(s): hot and chills Other reaction(s): hot and chills Other reaction(s): hot and chills Other reaction(s): flushing Other reaction(s): hot and chills  Other Reaction(s): Other (See Comments)  Hot and chills, Hot and chills   Naproxen Other (See Comments), Hives and Rash    Other reaction(s): worsens interstitial cystitis  Other Reaction(s): worsens interstitial cystitis  Worsens IC symptoms    Other reaction(s): worsens interstitial cystitis Other reaction(s): worsens interstitial cystitis Other reaction(s): worsens interstitial cystitis   Nortriptyline Hives and Other (See Comments)    "My body doesn't metabolize it anymore." Other reaction(s): Unknown Other reaction(s): Unknown Other reaction(s): Unknown Other reaction(s): Unknown   Nortriptyline Hcl Other (See Comments)    "My body doesn't metabolize it anymore."  Other reaction(s): Unknown   Oxycodone Other (See Comments)    Interstitial cystitis  Bladder pain  Other reaction(s): bladder pain  Other reaction(s): other  Other Reaction(s): bladder pain  Interstitial cystitis    Interstitial cystitis Bladder pain Other reaction(s): bladder pain Other reaction(s): other    Other reaction(s): bladder pain  Other Reaction(s): Other (See Comments)   Oxycontin [Oxycodone Hcl] Other (See Comments)    Interstitial cystitis    Pennsaid [Diclofenac  Sodium] Hives   Telmisartan Hives, Other (See Comments) and Rash    headache  Other reaction(s): sinus pain, ha's  Other reaction(s): headache  Other Reaction(s): Headache, sinus pain, ha's  headache headache    headache Other reaction(s): sinus pain, ha's Other reaction(s): sinus pain, ha's Other reaction(s): sinus pain, ha's Other reaction(s): headache Other reaction(s): sinus pain, ha's  Other Reaction(s): Other (See Comments)  headache, headache   Tramadol Itching and Other (See Comments)    sedation  Other reaction(s): sedation/itching  Other Reaction(s): sedation/itching  sedation sedation    sedation Other reaction(s): sedation/itching Other reaction(s): sedation/itching Other reaction(s): sedation/itching Other reaction(s): sedation/itching  sedation, sedation     Outpatient Medications Prior to Visit  Medication Sig Dispense Refill   acetaminophen  (TYLENOL ) 500 MG tablet Take by mouth.     Acetylcysteine (NAC) 600 MG CAPS Take 1 capsule (600 mg total) by mouth 2 (two) times daily. 60 capsule 0   ACIDOPHILUS LACTOBACILLUS PO Take 1 tablet by mouth daily.     amLODipine (NORVASC) 2.5 MG tablet Take 2.5 mg by mouth daily.     amLODipine (NORVASC) 5 MG tablet Take 1 tablet by mouth once daily Orally Once a day for 90 days     benzocaine  (ORAJEL) 10 % mucosal gel Use as directed 1 application in the mouth or throat as needed for mouth pain. 5.3 g 0   calcium  carbonate (SUPER CALCIUM ) 1500 (600 Ca) MG TABS tablet 1 tablet with meals Orally Once a day  cetirizine (ZYRTEC) 10 MG chewable tablet Chew by mouth.     Cholecalciferol 50 MCG (2000 UT) CAPS Take by mouth.     cyanocobalamin (VITAMIN B12) 1000 MCG tablet Take by mouth.     denosumab  (PROLIA ) 60 MG/ML SOSY injection as directed Every 6 months     DULoxetine  (CYMBALTA ) 30 MG capsule Take 3 capsules (90 mg total) by mouth daily. 270 capsule 3   estradiol (ESTRACE) 0.1 MG/GM vaginal cream Place vaginally.      fentaNYL  (DURAGESIC ) 25 MCG/HR Place 1 patch onto the skin every 3 (three) days. 5 patch 0   fluticasone  (FLONASE  ALLERGY RELIEF) 50 MCG/ACT nasal spray 1 spray in each nostril Nasally Once a day     gabapentin  (NEURONTIN ) 100 MG capsule Take 100 mg by mouth 4 (four) times daily.     HYDROcodone -acetaminophen  (NORCO/VICODIN) 5-325 MG tablet Take 1 tablet by mouth 2 (two) times daily as needed for moderate pain (pain score 4-6). 60 tablet 0   levothyroxine  (SYNTHROID ) 75 MCG tablet Take 75 mcg by mouth daily before breakfast.     magnesium gluconate (MAGONATE) 500 MG tablet Take 500 mg by mouth 2 (two) times daily.     naloxone  (NARCAN ) nasal spray 4 mg/0.1 mL Place 1 spray into the nose once.     NONFORMULARY OR COMPOUNDED ITEM Apply 1 Pump topically 4 (four) times daily as needed. Ketamine 10%, Baclofen  2%, Cyclobenzaprine 2%, Ketoprofen 10%, Gabapentin  6%, Bupivacaine 1%, Amitryptiline 5% Dispense 100mg  100 each 3   ondansetron  (ZOFRAN ) 4 MG tablet 1 tablet as needed Orally Twice a day for 30 days     pantoprazole  (PROTONIX ) 40 MG tablet Take 40 mg by mouth daily.     Polyethyl Glycol-Propyl Glycol (SYSTANE FREE OP) Apply 1 drop to eye as needed.     predniSONE  (DELTASONE ) 1 MG tablet Take 2 tablets (2 mg total) by mouth daily with breakfast. 60 tablet 0   Propylene Glycol (SYSTANE COMPLETE PF OP) Apply to eye.     Pyridoxine  HCl (VITAMIN B-6 PO) Vitamin B6     rosuvastatin (CRESTOR) 5 MG tablet Take 5 mg by mouth daily.     Cholecalciferol (VITAMIN D -3) 25 MCG (1000 UT) CAPS Take 2,000 Units by mouth daily. (Patient not taking: Reported on 09/29/2023)     Cholecalciferol 125 MCG (5000 UT) capsule Take by mouth. (Patient not taking: Reported on 09/29/2023)     Methylcobalamin 1000 MCG SUBL 1 tablet Takes 2500 mg. (Patient not taking: Reported on 09/29/2023)     RA GLUCOSAMINE SULFATE 500 MG TABS  (Patient not taking: Reported on 09/29/2023)     Vitamin D , Ergocalciferol , (DRISDOL ) 1.25 MG (50000 UNIT)  CAPS capsule Take 1 capsule (50,000 Units total) by mouth every 7 (seven) days. (Patient not taking: Reported on 09/29/2023) 20 capsule 0   montelukast  (SINGULAIR ) 10 MG tablet Take 1 tablet (10 mg total) by mouth at bedtime. (Patient not taking: Reported on 09/29/2023) 90 tablet 3   No facility-administered medications prior to visit.    Review of Systems  Constitutional:  Negative for chills, diaphoresis, fever, malaise/fatigue and weight loss.  HENT:  Negative for congestion.   Respiratory:  Positive for shortness of breath. Negative for cough, hemoptysis, sputum production and wheezing.   Cardiovascular:  Negative for chest pain, palpitations and leg swelling.     Objective:   Vitals:   09/29/23 1346  BP: 118/72  Pulse: 71  SpO2: 97%  Weight: 145 lb 1.6 oz (65.8 kg)  Height: 5' 2.5" (1.588 m)   SpO2: 97 %  Physical Exam: General: Elderly-appearing, no acute distress HENT: [place], AT Eyes: EOMI, no scleral icterus Respiratory: Clear to auscultation bilaterally.  No crackles, wheezing or rales Cardiovascular: RRR, -M/R/G, no JVD Extremities:-Edema,-tenderness Neuro: AAO x4, CNII-XII grossly intact Psych: Normal mood, normal affect  Data Imaging CXR 07/15/18 - no infiltrate, edema or effusion. VQ scan 07/15/18 - no V/Q mismatch HRCT Chest 08/20/21 - Biapical pleuroparenchymal scarring. No evidence of ILD. No significant GGO, septal thickening, subpleural reticulation, traction bronchiectasis or honeycombing present. Calcified mediastinal and hilar lymph nodes.  Calcified splenic artery aneurysm. CT Coronary 03/24/22 - Visualized lung parenchyma with unchanged mediastinal and hilar nodes, calcified. No masses, infiltrate, effusion or pneumothorax.  PFT 07/04/18 FVC 2.77 (107%) FEV1 2.27 (118%) Ratio 82  TLC 96% DLCO 61% Interpretation: Normal spirometry. Mildly reduced DLCO  07/28/21 FVC 2.6 (106%) FEV1 2.23 (123%) Ratio 84 TLC 90% DLCO 65% Interpretation: No obstructive and  restrictive defect. Mildly reduced DLCO  TTE 07/05/18 Normal EF 55-60%. Mild diastolic dysfunction, mild TR, mild PH  Labs CBC 07/01/18 reviewed. Hg appropriate. 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 Unclear cause of her reduced DLCO. CT Chest reviewed with no evidence of early ILD and recent labs neg for anemia. Low suspicion for pulmonary vascular disease however can re-evaluate with echocardiogram. We discussed utility in further testing and after discussion she wishes to hold and working on strength at this point. Counseled on regular activity. No prior benefit from inhalers and unable to tolerate beta-agonists due to tremors.  Dyspnea on exertion Deconditioning  --CONTINUE physical therapy and water therapy --RE-REFER to [facility] for water therapy --Stay active  Reduced DLCO --No further work-up. Patient will contact us  if she wishes for echocardiogram in the future   Orders Placed This Encounter  Procedures   Ambulatory referral to Physical Therapy    Referral Priority:   Routine    Referral Type:   Physical Medicine    Referral Reason:   Specialty Services Required    Requested Specialty:   Physical Therapy    Number of Visits Requested:   1   No orders of the defined types were placed in this encounter.  Return in about 1 year (around 09/28/2024).   I have spent a  total time of 30-minutes on the day of the appointment including chart review, data review, collecting history, coordinating care and discussing medical diagnosis and plan with the patient/family. Past medical history, allergies, medications were reviewed. Pertinent imaging, labs and tests included in this note have been reviewed and interpreted independently by me  Abdoulie Tierce Genetta Kenning, MD [facility] 09/29/2023 2:28 PM

## 2023-09-29 NOTE — Patient Instructions (Addendum)
 Dyspnea on exertion Deconditioning  --CONTINUE physical therapy and water therapy --RE-REFER to [facility] for water therapy --Stay active

## 2023-09-30 ENCOUNTER — Other Ambulatory Visit: Payer: Self-pay | Admitting: Physical Medicine and Rehabilitation

## 2023-09-30 MED ORDER — HYDROCODONE-ACETAMINOPHEN 5-325 MG PO TABS: 1.0000 | ORAL_TABLET | Freq: Two times a day (BID) | ORAL | 0 refills | Status: DC | PRN

## 2023-10-05 ENCOUNTER — Encounter: Attending: Physical Medicine and Rehabilitation | Admitting: Physical Medicine and Rehabilitation

## 2023-10-05 ENCOUNTER — Encounter: Payer: Self-pay | Admitting: Physical Medicine and Rehabilitation

## 2023-10-05 VITALS — BP 149/81 | HR 68 | Ht 62.5 in | Wt 146.0 lb

## 2023-10-05 DIAGNOSIS — M797 Fibromyalgia: Secondary | ICD-10-CM

## 2023-10-05 DIAGNOSIS — G500 Trigeminal neuralgia: Secondary | ICD-10-CM

## 2023-10-05 DIAGNOSIS — G894 Chronic pain syndrome: Secondary | ICD-10-CM

## 2023-10-05 DIAGNOSIS — Z5181 Encounter for therapeutic drug level monitoring: Secondary | ICD-10-CM

## 2023-10-05 DIAGNOSIS — Z79891 Long term (current) use of opiate analgesic: Secondary | ICD-10-CM | POA: Diagnosis not present

## 2023-10-05 MED ORDER — FENTANYL 25 MCG/HR TD PT72: 1.0000 | MEDICATED_PATCH | TRANSDERMAL | 0 refills | Status: DC

## 2023-10-05 NOTE — Progress Notes (Signed)
 Subjective:    Patient ID: [name], female    DOB: 1940-11-26, 83 y.o.   MRN: [id number]  HPI   [name] presents today for f/u of severe trigeminal neuralgia.   1) Severe trigeminal neuralgia: -today pain is 8/10 -she needs refill of fentanyl patch -mouth gets dry from gabapentin -she is planning to see [name] at [facility] again -she went to [facility] 2 weeks ago -Friday or Saturday night it was time for her to have another patch and her husband felt she should go off the patch -she is doing well with hydrocodone and fentanyl patch -had been unable to go to the gym when her pain was severe -she is not doing very well -they had to cancel one of the appointments due to snow -the next week [name] who is doing her programming had the flu -she turned off the device for 2 weeks as it was making her pain worse -she had an appointment with [name] on Monday and tried new programs and they seemed to be working better -she is very happy with her new [facility]ician but has not found any helpful settings yet -she turns off the stimulator at night as she can sleep well at night -if she turns off the stimulator during the day she has 1 hour of relief and then the old pain returns -the last two or three days she has had bad pain -she is filling out pain diaries -she was able to cook Thanksgiving dinner but felt a lot of pain the next day -she is doing ok -she likes her new physician at [facility]. He works a lot with the [employer] rep because the rep is just for back pain -he takes pictures and asks for the location to be adjusted -at [facility] she is just given one program and then it stops working.  -Saw [name] 8 weeks ago and the new programs worked but then it stops working when she returns home -she is seeing neurosurgeon [name] on Thursday  [name] researched red light therapy -she went to see the doctor in [place] and was only been able to spend 5 minutes with her -they  did about 15 minutes of adjustments and the new programs seemed to hurt more than they help -has had stimulator turned off since last night -was able to enjoy her birthday -she did well for 3 weeks but recently pain has been around 8-10 -when pain is very severe it makes her eye hurt -the way her stimulator is set it makes her nose hurt She is going back to [facility] on April 10th -she is managing with the gabapentin and had to take 2 extra yesterday and it did not make her sleepier  -pain fluctuates  -she went to [place] last week with her children.  -she keeps a journal of when she changes the program and what her pain level is in her teeth, eyes, and cheek. She put in not to use the teeth program -she said if they could hit the nerve between the two teeth that would be helpful -they have her an 11 and 12 in that region,  -she cannot get the right level of stimulation. Before she left she decreased it by 50%. Now she is on a cheat program and the lowest amount. It is helping the teeth but hurting the eye.  -[name] had a baby on 11/14 and she is not coming back until January.  -she talked to the representative -  last night she turned off the stimulator -she slept almost day today and did not want to wake up for supper which was rare for her -she is having a difficult time weaning off the hydrocodone.  -she has been off of it in two days -she is expecting a return call from [name] team soon to discuss wean off fentanyl -drove up on Tuesday and on Wednesday was supposed to have an appointment with pain psychologist. When they got there they were told that appointment had been changed to virtual. The next day they had a more sophisticated MRI. [name] did moving in MRI so MRI was not as helpful. The next day they saw [name] and discussed several procedural options and then went back to the motor stimulator- that is scheduled on October 16th.  -she recently went on vacation and had to spend the  majority of the vacation in bed due to the severity of her pain -she stopped using the intranasal ketamine because although it helped initially, it appeared to make her pain worse later -she asks if she can wean off some medication as she had a recent appointment with [name] and was told she may have a lot of postoperative pain if she is taking so much pain medication. She would still like to do this and also experiences sedation from her medication. She asks how she would go about weaning her medications -she asks if she should wean off the gabapentin since this makes her so sleepy She asks if she should restart Topamax. -she was encouraged by her phone visit with [name] and plans to pursue surgery. She is going to [facility] next week for Brain MRI, neuropsych eval, and to discuss the risks and benefits of the procedure with [name] -pain was so severe in the past that she expressed suicidality to her husband [name].  -she feels that none of her medications are helpful but she knows that when she tries to wean them her pain worsens.  -she asks about refill of the compounding cream -her husband asks about referral to ketamine clinic for depression rather than pain -she went back to the dentist because it was urting between two teeth and he said there is nothing wrong with those teeth and it is the trigeminal nerve When she took a shower that usually seems to help with but was migrating around her face.  -appointment with neurology in July -[name] cancelled the zoom call in April but this was rescheduled to July.  -she finds that only the gabapentin helps -she is not sure if the fentanyl helps When she is feeling really bad she tries to take another one but she gets very sleepy and dizzy.  -she is taking 8 a day of 100mg  per day- she takes two gabapentin and tylenol and hydrocodone in the morning. She takes another two at breakfast around 11, another 2 at lunch around 1:30pm.  -she had a  recent root canal and pain has become much more severe after that -her pain is in the two teeth next to where she had the root canal -Oragel heps for a bout an hour.  -she tried increasing her Gabapentin to 300mg  TID but she could not tolerate this. It made her too sleepy.  -she contacted Dr. Lorrine Kin and he recommended she pause wean of Fentanyl patch and I agree with this -she took last patch last night. She still has 12.46mcg patch available -her husband asks whether she should take a  higher dose of gabapentin -the fentanyl patch makes her very sleepy and so allows her to sleep. It has also made her dizzier.  -the fentanyl patch did not provide more relief- she would like to wean off. Weaned to 25, then 12.46mcg, then off this week -referral was to [facility] and not to Dr. Hector Shade she states and they need a new referral send to Dr. Clint Lipps -she is using the compound cream and this helps- she uses this twice per day -capsaicin made it burn  -lamotrigine makes her too sleepy and dizzy to come down the stairs by herself and she does not feel comfortable going to the shower -left eye more open than usual. -she does not feel she is getting relief from the fentanyl patch -she feels that the procedure at [facility] is a last resort.  -Given darkening of skin and worsening eye lid closure, MRI brain stat obtained and shows new cyst that could be post-surgical. Recommended she go to ED given worsening dizziness and facial swelling as well. ED physician discussed case with NSGY and neurology and discharged home recommending follow-up with Dr. Belva Bertin and Dr. Molli Hazard. She was given IV Fentanyl in the ED which helped and discharged on Fentanyl patch, which has not helped.  -discussed increasing Fentanyl patch to since she has not experienced any negative side effects, no respiratory depression, and her husband is agreeable. I have sent in new script to start Saturday, but  insurance required prior auth, which was approved.  -tonight would be the third tay of the Fentanyl patch -she is still taking the Gabapentin -she feels that when she lays down she is spinning.  -eye continues to appear more closed.  -Husband has called Dr. Kathlene Cote and Dr. Barb Merino office to scheduled follow-up appointments and has appointments with both tomorrow.  -Will run out of Fentanyl patch by Sunday -pain is worsening and nothing is providing relief -she asks whether she should go to ED -she has also noted dizziness and is not sure if this is from being in the MRI or from the [place].  -morphine was not effective and caused itching, sarna lotion did help wit this.  -hydrocodone has been most effective thus far -nucynta was not effective but she is not sure whether she gave it a fair trial -she has month appointments scheduled with Riley Lam and f/u with me in Feb -she used the Norco last night and this morning with good benefit and has started to feel the pain return this afternoon -she did find benefit from the compound cream- she continues to use this.  -She continues to have a sharp shooting pain doing around her eyelid and the tip teeth and in the top of her head.  -has a radiofrequency ablation treatment that helped but not completely -she is not having as much sharp knife-like pin like in her eye Her top teeth in her left law have been hurting really badly.  -she now takes the baclofen with the gabapentin and they seem to work well together.  -she had surgical procedure and feels worse -yesterday she took a half a tablet but this morning she took a whole one. She does fine with the hydrocodone but a whole one makes her too sleepy.  -she is taking 2 100mg  Gabapentin three times per day but it makes her dizzy and sleepy so she cannot funciton -the cold worsens her pain.  -The IV fentanyl did help with the pain.  -her husband  feels she should get one more dose of the 25mg   fentanyl. -she did get one week benefit from the steroid injection.  -she is doing terribly -she is miserable -she feels depressed due to the pain -she has considered taking the whole bottle of hydrocodone because because she can't take this pain anymore.  -she started taking the Gabapentin 300mg  TID.  -she does find relief from the hydrocodone -the pain is constant -she wonders if it is inflammation.  -she has not tried turmeric. Her son-in-law gave her a bottle and she couldn't stomach it.   2) Fibromyalgia: -She has been almost nonfunctional due to the severity of her pain. It continues to be very severe.  -She would like to wean off the Gabapentin as she feels at risk of falls -she is taking Topamax BID. She felt this was helpful but made her unsteady on her feet.  -still using gabapentin and this is helping.  -she feels so fatigued that she can only have a shower every other day.   3) Osteoporosis: -husband asks whether she should get a Prolia shot.   4) Dizziness: -head was swimming -she felt like she was going to fall.  -worsening -feels like she is spinning, not the room  -She tried the Phenytoin for 2 days and did not feel benefit so stopped it.   -she feels the gabapentin aggravates this and she would love to wean off but knows it helps her.   -She feels very sleepy during the day.   -She has never tried Topirmate before.   5) Sore throat -developed stopping lamotrigine since not helping.   Prior history:  Her pain continues to be in her left eye, forehead. She has not tried any topicals for pain. She uses capsaicin for her back and knees.   Since last visit nothing we have tried has helped with her pain. She has tried to decrease her Gabapentin dose but then her pain increased so she has resumed taking 4 Gabapentin per day.   Her husband asks whether he should contact her surgeon regarding whether nerve may have become unclamped.   Discussed prolotherapy,  trigger point injections, Baclofen, Clonazepam, Phenytoin as other options we could try,=.  Pain continues to be excruciating and she states she is not sure how much longer she can live like this.   Prior history: She has having some nausea, lightheadedness and felt this may be related to her Gabapentin. She did not take any yesterday and then felt more sever pain in the evening. She also gets electrical shooting across her jaw.   She does not feel that she has cluster headaches as the pain has been so constant. She feels that she is getting worse. The oxygen made her feel relaxed but it is not helping.   The Sarna lotion does help with the itching in her head.   She was willing to try the Amitriptyline but it makes her too sleepy in the morning.   Her teeth and left eye incredibly hurt.   She feels that the Gabapentin has been making her dizziness.   She asks about biofeedback.   The pain is 7/10 on average, similar to last time.   When the pain is severe it can make her depressed.    6) Macular degeneration:  Has been following with ophthalmology  7) Family illness: -she lost two nephews recently  Pain Inventory Average pain 8 Pain Right Now 8 My pain is constant, sharp, burning, dull, stabbing, and  aching  In the last 24 hours, has pain interfered with the following? General activity 9 Relation with others 9 Enjoyment of life 9  What TIME of day is your pain at its worst? morning  and daytime Sleep (in general) Good  Pain is worst with  bending & standing, walking, sitting, inactivity, some activities  Pain improves with rest, heat, medication, therapy, injections  Relief with medication is 1   Family History  Problem Relation Age of Onset   Heart disease Mother    Heart disease Father    Stroke Father    Stroke Sister    Asthma Daughter    Colon cancer Neg Hx    Social History   Socioeconomic History   Marital status: Married    Spouse name: Not on  file   Number of children: 2   Years of education: college   Highest education level: Not on file  Occupational History   Occupation: housewife    Employer: RETIRED  Tobacco Use   Smoking status: Never   Smokeless tobacco: Never  Vaping Use   Vaping status: Never Used  Substance and Sexual Activity   Alcohol use: No    Alcohol/week: 0.0 standard drinks of alcohol   Drug use: No   Sexual activity: Not on file  Other Topics Concern   Not on file  Social History Narrative   1 cup of Jasmine tea daily    Social Drivers of Health   Financial Resource Strain: Low Risk  (05/08/2023)   Received from [facility]   Overall Financial Resource Strain (CARDIA)    Difficulty of Paying Living Expenses: Not hard at all  Food Insecurity: No Food Insecurity (05/08/2023)   Received from [facility]   Hunger Vital Sign    Worried About Running Out of Food in the Last Year: Never true    Ran Out of Food in the Last Year: Never true  Transportation Needs: No Transportation Needs (05/08/2023)   Received from [facility] - Transportation    In the past 12 months, has lack of transportation kept you from medical appointments or from getting medications?: No    Lack of Transportation (Non-Medical): No  Physical Activity: Inactive (11/13/2019)   Received from [facility] visits prior to 08/22/2022., [facility] visits prior to 08/22/2022.   Exercise Vital Sign    Days of Exercise per Week: 0 days    Minutes of Exercise per Session: 0 min  Stress: No Stress Concern Present (11/13/2019)   Received from [facility] visits prior to 08/22/2022., [facility] visits prior to 08/22/2022.   [employer] of Occupational Health - Occupational Stress Questionnaire    Feeling of Stress : Not at all  Social Connections: Socially Integrated (11/13/2019)   Received  from [facility] visits prior to 08/22/2022., [facility] visits prior to 08/22/2022.   Social Advertising account executive [NHANES]    Frequency of Communication with Friends and Family: More than three times a week    Frequency of Social Gatherings with Friends and Family: More than three times a week    Attends Religious Services: 1 to 4 times per year    Active Member of [employer] or Organizations: Yes    Attends Banker Meetings: 1 to 4 times per year    Marital Status: Married  Past Surgical History:  Procedure Laterality Date   CHOLECYSTECTOMY  03/2010   COLONOSCOPY  2009   Dr. Jayne Mews    CYSTOSCOPY     Multiple Cystoscopies with stents or biopsy    ESOPHAGOGASTRODUODENOSCOPY  10/2013   EYE SURGERY     bilateral cataract removal   Gamma knife radiation  01/2018   for trigeminal neuralgia   REFRACTIVE SURGERY Bilateral 2022   RHIZOTOMY Left 04/28/2021   Procedure: Left Percutaneous trigeminal balloon rhizotomy;  Surgeon: Cannon Champion, MD;  Location: [facility] OR;  Service: Neurosurgery;  Laterality: Left;   TONSILLECTOMY     Past Surgical History:  Procedure Laterality Date   CHOLECYSTECTOMY  03/2010   COLONOSCOPY  2009   Dr. Jayne Mews    CYSTOSCOPY     Multiple Cystoscopies with stents or biopsy    ESOPHAGOGASTRODUODENOSCOPY  10/2013   EYE SURGERY     bilateral cataract removal   Gamma knife radiation  01/2018   for trigeminal neuralgia   REFRACTIVE SURGERY Bilateral 2022   RHIZOTOMY Left 04/28/2021   Procedure: Left Percutaneous trigeminal balloon rhizotomy;  Surgeon: Cannon Champion, MD;  Location: [facility] OR;  Service: Neurosurgery;  Laterality: Left;   TONSILLECTOMY     Past Medical History:  Diagnosis Date   Adenomatous colon polyp 03/24/2018   Allergy    Arthritis    Cataract    Chronic headaches    Depression    Fibromyalgia    Gallstones    GERD (gastroesophageal reflux disease)     Hyperlipemia    Hypertension    Interstitial cystitis    Lactose intolerance    LBBB (left bundle branch block)    Osteoarthritis    Osteopenia    Pneumonia    Thyroid disease    Tinnitus    Trigeminal neuralgia    BP (!) 149/81 Comment: Patient has not taken BP medication today.  Pulse 68   Ht 5' 2.5" (1.588 m)   Wt 146 lb (66.2 kg)   SpO2 98%   BMI 26.28 kg/m   Opioid Risk Score:   Fall Risk Score:  `1  Depression screen [facility] 2/9     10/05/2023    9:18 AM 05/25/2023   11:27 AM 02/23/2023   11:21 AM 12/28/2022    1:35 PM 10/27/2022    2:05 PM 09/15/2022    1:25 PM 07/21/2022    1:38 PM  Depression screen PHQ 2/9  Decreased Interest 0 0 0 0 0 0 1  Down, Depressed, Hopeless 0 0 0 0 0 0 1  PHQ - 2 Score 0 0 0 0 0 0 2    Review of Systems  Constitutional: Negative.   HENT: Negative.    Eyes:  Positive for visual disturbance.  Respiratory: Negative.    Cardiovascular: Negative.   Gastrointestinal: Negative.   Endocrine: Negative.   Genitourinary: Negative.   Musculoskeletal: Negative.        Pain in left side of face  Skin: Negative.   Allergic/Immunologic: Negative.   Neurological:  Positive for dizziness, tremors, light-headedness and numbness.       Tingling   Hematological: Negative.   Psychiatric/Behavioral: Negative.    All other systems reviewed and are negative.      Objective:   Physical Exam Gen: no distress, normal appearing HEENT: oral mucosa pink and moist, NCAT Cardio: Reg rate Chest: normal effort, normal rate of breathing Abd: soft, non-distended Ext: no edema Psych: pleasant, normal affect Skin: intact Neuro: Alert and  oriented x3, left eye drop . Assessment & Plan:  [name] is an 83 year old woman who presents for f/u of left sided trigeminal neuralgia and dizziness.    1) Sensory deafferation pain, left sided trigeminal neuralgia vs cluster headaches (she does have lacrimation and runny nose on that side). -discussed that she has  been happy with her new provider at [facility] -discussed that she is following up with [name] at [facility] -refilled fentanyl, discussed that she tried stopping this and pain was very severe -avoid proceed foods, eggs, gluten, dairy -continue hydrocodone -discussed that she is doing Zoom calls with a psychiatrist -d/c narcan and naltrexone, discussed that she uses the hydrocodone sparingly.  -discussed that she had new programs installed on Monday -discussed that she is doing well with the hydrocodone -discussed pruritus with the fentanyl patch -continue following with pain psychologist, discussed that I think it is phenomenal that she is doing this -refilled hydrocodone and fentanyl -discussed rating her pain for the programming of electrical stimulation   -discussed mechanism of action of low dose naltrexone as an opioid receptor antagonist which stimulates your body's production of its own natural endogenous opioids, helping to decrease pain. Discussed that it can also decrease T cell response and thus be helpful in decreasing inflammation, and symptoms of brain fog, fatigue, anxiety, depression, and allergies. Discussed that this medication needs to be compounded at a compounding pharmacy and can more expensive. Discussed that I usually start at 1mg  and if this is not providing enough relief then I titrate upward on a monthly basis.    -discussed that savella caused hallucinations -discussed that we tried lyrica and we cannot recall but she had some side effect -discussed that topamax did not help and made her feel worse -discussed that carbamazpeine did not help -discussed that she is happy that she does not have to return to [place] as those trips caused her a lot of fatigue -discussed that pain was bad after she cooked for Thanksgiving -discussed her follow-up with [name] -discussed the negative experience when she last went to [place] -discussed that next available appointment  was on June 24th or 26th, but they were able to move this up to May 22nd or 24th -discussed that she keeps a journal with the setting with the level of her pain.  -continue Diclofenac 3%, Gabapentin 5%, Lidocaine 5%, Menthol 1% compounded at [facility]:  apply 1-2 pumps three to four times per day as needed, discussed that this dose help -discussed that she has had to turn off the stimulator more since she has established care at [facility] -discussed response to her stimulator -discussed doubling the gabapentin when pain is severe Increase low dose naltrexone to 3mg  HS -discussed her response to the stimulator She does have a history of migraines.  -Discussed that if she has to be in bed all day due to her pain it may be beneficial for her husband to give her at least one hydrocodone per day to enable her to function -provided refill of Fentanyl patch as she will be leaving for her surgery in [place] soon and her husband plans to stay there for some time in case she has any surgical complications.  -discussed her follow-up with pain psychology.  -recommended weaning off Fentanyl, discussed stopping fentanyl because she is on 12.5mg  today but she had tried this a couple of weeks ago and pain was so severe that she needed patch to be replaced.  -discussed that she  is taking hydrocodone 4 tablets per day, and recommend to decrease this to 3 tabs per day for three days and then to keep decreasing the tab by once per day.  -discussed that skipping the morning dose of hydrocodone will at least allow her to sleep -recommended trying Frankincense and Myrrh essential oils mixed in coconut oil and applied to area of pain -recommended restarting Topamax -[facility] to prescribe compounding gel refill -continue Gabapentin 100mg  up to 8 times a day, discussed her current schedule of takin -discussed using Oragel multiple times per ay since this helps for an hour, use the topical cream  intermittently in between -continue follow-up with [facility] to set up an appointment for her with them. Unfortunately Dr. Clint Lipps has retired but was able to set her up with Dr. Myrla Halsted who specializes in migraines and trigeminal neuralgia on July 25th. Let patient and husband know. They will mail her their address.   -commended her for calling Dr. Lorrine Kin as well to let him know how she is feeling -recommended taking 1 hydrocodone now for the severe pain, and discussed that the Fentanyl patch with take 12-24 hours to kick in -discussed with April the neurology referral and she did specify Dr. Hector Shade and spoke with the [facility] Neurology department -discussed CBD oil, her son bought her some of this and she felt that it greatly helped  Recommend topical CBD oil- discussed its benefits in reducing inflammation, pain, insomnia, and anxiety, but she stopped because effect wore of.  -Discussed that CBD oil differs from marijuana in that it does not contain THC- the substance that causes euphoria.  -Discussed that it is made from the hemp plant.  -It has been used for thousands of years -preliminary research suggests that if may be able to shrink cancerous tumors, stop plaque formation in Alzheimer's Disease, and slow the progress of brain disease from concussions.  -Additional benefits that have been demonstrated in studies include improved nausea, indigestion, and brain health, and reduced seizures.  -In a survey 92% of patients who tried medical cannabis felt it improved symptoms such as chronic pain, arthritis, migraines, and cancer.   -Provided with a pain relief journal and discussed that it contains foods and lifestyle tips to naturally help to improve pain. Discussed that these lifestyle strategies are also very good for health unlike some medications which can have negative side effects. Discussed that the act of keeping a journal can be therapeutic and helpful to realize patterns  what helps to trigger and alleviate pain.   -discussed with Dr. Marcia Brash trigeminal deafferation pain, discussed the difference between this and trigeminal neuralgia with patient and husband, discussed motor cortex stimulation. -discussed whether or not to continue fentanyl patch, husband does feel that the IV fentanyl  -encouraged following with Dr. Sweet/Dr. Hyacinth Meeker at [facility] to try procedure recommended by D. Ostegard.  -discussed mechanism of action of low dose naltrexone as an opioid receptor antagonist which stimulates your body's production of its own natural endogenous opioids, helping to decrease pain. Discussed that it can also decrease T cell response and thus be helpful in decreasing inflammation, and symptoms of brain fog, fatigue, anxiety, depression, and allergies. Discussed that this medication needs to be compounded at a compounding pharmacy and can more expensive. Discussed that I usually start at 1mg  and if this is not providing enough relief then I titrate upward on a monthly basis.   -stop lamotrigine as it is making her dizzy without benefit -reviewed  patient's medical notes -discussed her progress with getting a referral to [facility] -recommended going to ED given darkening of skin, worsening eye closure, finding of cyst, dizziness to receive prompt neurosurgical eval regarding if surgery is necessary, and also for pain management given worsening pain refractory to all treatments and resulting suicidality. Reviewed ED doc notes- he discussed with on-call neurology and NSGY and determined that patient could be discharged with outpatient follow-up. IV fentanyl was administered with relief to patient. She was discharged on fentanyl patch which has not provided relief. She is getting - no side effects- discussed increasing to and her husband is in agreement. Prior auth completed and approved. Discussed with husband starting patch on Satruday after she has worn  patch for 72 hours -encouraged follow-up with Dr. Marcia Brash, husband let me know appointment has been set up 1/18.  -recommended follow-up with Dr. 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  -Discussed that turmeric  -discussed steroid taper. -continue savella, husband feels like it is helping with her suicidal thoughts. Increase to 25mg .  Discontinued hydrocodone  -discuss mouth guard with dentist.  -d/c topamax -she does have another procedure scheduled. -refilled Norco -follow-up monthly with Riley Lam and with me in February, placed on waitlist for earlier appointment with me, advised phone visits/MyChart are a great way to communicate in the interim -discussed that she can take an additional one or two tylenol for breakthrough pain -recommended checking CMP for liver enzymes with next labs -prescribed NAC 600mg  BID to protect the liver while taking tylenol, discussed its health benefits for mitochondrial function -d/c Baclofen  -f/u with Dr. Marcia Brash and Dr. Lorrine Kin 1/18 -discussed retrying carbamazepine to see if it is effective for her. It is $89 for her, so we discussed trying Lamictal instead.  -encouraged anti-inflammatory diet.  -she had sinusitis that was viral and was treated with antibiotics. This was in the early 90s. The symptoms resolved after 2 months. She uses to get sinus infections and saline rinse.  -She has had multiple root canals.  -Discontinue 100% oxygen via facemask as this was not helpful.  -Discussed that unfortunately Sprint PNS is not indicated for craniofacial pain. -Amitriptyline and Lyrica did not help.  -Continue Cymbalta 60mg .  -Topiramate  helped but caused dizziness.  -Provided with hand out with information regarding trigeminal neuralgia -Failed Penytoin.  -trial lidocaine patch -She would like to try biofeedback. -reviewed neurology note.  -referred to Dr. Hector Shade.  -will try trigger point injections next visit.  -Continue Ketamine 10%, Baclofen 2%, Cyclobenzaprine 2%, Ketoprofen 10%, Gabapentin 6%, Bupivacaine 1%, Amitryptiline 5% to apply to painful areas- using 3-4 times per day.  -Can consider Meloxicam if Phenytoin does not help.  -Discussed Qutenza as an option for neuropathic pain control. Discussed that this is a capsaicin patch, stronger than capsaicin cream. Discussed that it is currently approved for diabetic peripheral neuropathy and post-herpetic neuralgia, but that it has also shown benefit in treating other forms of neuropathy. Provided patient with link to site to learn more about the patch: https://www.clark.biz/. Discussed that the patch would be placed in office and benefits usually last 3 months. Discussed that unintended exposure to capsaicin can cause severe irritation of eyes, mucous membranes, respiratory tract,  and skin, but that Qutenza is a local treatment and does not have the systemic side effects of other nerve medications. Discussed that there may be pain, itching, erythema, and decreased sensory function associated with the application of Qutenza. Side effects usually subside within 1 week. A cold pack of analgesic medications can help with these side effects. Blood pressure can also be increased due to pain associated with administration of the patch.  -she cut down on her gabapentin due to her dizziness    Turmeric to reduce inflammation--can be used in cooking or taken as a supplement.  Benefits of turmeric:  -Highly anti-inflammatory  -Increases antioxidants  -Improves memory, attention, brain disease  -Lowers risk of heart disease  -May help prevent cancer  -Decreases  pain  -Alleviates depression  -Delays aging and decreases risk of chronic disease  -Consume with black pepper to increase absorption    Turmeric Milk Recipe:  1 cup milk  1 tsp turmeric  1 tsp cinnamon  1 tsp grated ginger (optional)  Black pepper (boosts the anti-inflammatory properties of turmeric).  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  -she is a good metabolizer of amitriptyline and has tolerated nortrypilline in the past, but without much benefit.    3) CKD: encouraged 6-8 glasses of water per day. She states that she had AKI with Ibuprofen in the past. Discussed checking Cr level next visit if we are considering Meloxicam -Discussed that Nucynta, and other pain medications, may stay in her bloodstream longer given her CKD   4) Hypernatremia: encouraged hydration   5) General health and pain: provided with list of healthy foods that are both nutritious and fight pain.   6) Depressed: Amitriptyline did help with this but she could not tolerate the drowsiness. Encouraged focusing on the factors that she can control, such as anti-inflammatory diet.   7) Fibromyalgia: Discussed goal to wean steroids prior to surgery. Continue 1mg  daily. She is on that intermittently. She started this again about a month ago. She has been on this about a year. Continue Savella -discussed benefits of cold water  showers.  -continue gabapentin.  Continue fentanyl and hydrocodone -avoid processed foods -avoid gluten, dairy, and eggs -eat fruits, vegetables, spices, and herbs -Discussed current symptoms of pain and history of pain.  -Discussed benefits of exercise in reducing pain. -Discussed following foods that may reduce pain: 1) Ginger (especially studied for arthritis)- reduce leukotriene production to decrease inflammation 2) Blueberries- high in phytonutrients that decrease inflammation 3) Salmon- marine omega-3s reduce joint swelling and pain 4) Pumpkin seeds- reduce inflammation 5) dark chocolate- reduces inflammation 6) turmeric- reduces inflammation 7) tart cherries - reduce pain and stiffness 8) extra virgin olive oil - its compound olecanthal helps to block prostaglandins  9) chili peppers- can be eaten or applied topically via capsaicin 10) mint- helpful for headache, muscle aches, joint pain, and itching 11) garlic- reduces inflammation 12) Green tea- reduces inflammation and oxidative stress, helps  with weight loss, may reduce the risk of cancer, recommend Double Green Matcha [place] of Tea daily  Link to further information on diet for chronic pain: [email or web address]   8) Dizziness: -discussed that this could be from the combination of gabapentin and baclofen.  -discussed that all the medications we are trying for pain may worsen the dizziness  9) Constipation:  -Provided list of following foods that help with constipation and highlighted a few: 1) prunes- contain high amounts of fiber.  2) apples- has a form of dietary fiber called pectin that accelerates stool movement and increases beneficial gut bacteria 3) pears- in addition to fiber, also high in fructose and sorbitol which have laxative effect 4) figs- contain an enzyme ficin which helps to speed colonic transit 5) kiwis- contain an enzyme  actinidin that improves gut motility and reduces constipation 6) oranges- rich in pectin (like apples) 7) grapefruits- contain a flavanol naringenin which has a laxative effect 8) vegetables- rich in fiber and also great sources of folate, vitamin C, and K 9) artichoke- high in inulin, prebiotic great for the microbiome 10) chicory- increases stool frequency and softness (can be added to coffee) 11) rhubarb- laxative effect 12) sweet potato- high fiber 13) beans, peas, and lentils- contain both soluble and insoluble fiber 14) chia seeds- improves intestinal health and gut flora 15) flaxseeds- laxative effect 16) whole grain rye bread- high in fiber 17) oat bran- high in soluble and insoluble fiber 18) kefir- softens stools -recommended to try at least one of these foods every day.  -drink 6-8 glasses of water per day -walk regularly, especially after meals.   10) Osteopenia - was tapered off the prednisone -discussed that currently her pain may be more of a risk to her than osteopenia given her suicidal ideation from her pain -discussed risk of fall with the medications that make her dizziness.   11) Daytime sleepiness -discussed that this is secondary to her medications.  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  -discussed red light therapy  15) HTN: -discussed clonidine patch could potentially help with HTN and pain.  -BP is 170/90 today.  -Advised checking BP daily at home and logging results to bring into follow-up appointment with PCP and myself. -Reviewed BP meds today.  -Advised regarding healthy foods that can help lower blood pressure and provided with a list: 1) citrus foods- high in vitamins and minerals 2) salmon and other fatty fish - reduces inflammation  and oxylipins 3) swiss chard (leafy green)- high level of nitrates 4) pumpkin seeds- one of the best natural sources of magnesium 5) Beans and lentils- high in fiber, magnesium, and potassium 6) Berries- high in flavonoids 7) Amaranth (whole grain, can be cooked similarly to rice and oats)- high in magnesium and fiber 8) Pistachios- even more effective at reducing BP than other nuts 9) Carrots- high in phenolic compounds that relax blood vessels and reduce inflammation 10) Celery- contain phthalides that relax tissues of arterial walls 11) Tomatoes- can also improve cholesterol and reduce risk of heart disease 12) Broccoli- good source of magnesium, calcium, and potassium 13) Greek yogurt: high in potassium and calcium 14) Herbs and spices: Celery seed, cilantro, saffron, lemongrass, black cumin, ginseng, cinnamon, cardamom, sweet basil, and ginger 15) Chia and flax seeds- also help to lower cholesterol and blood sugar  16) Beets- high levels of nitrates that relax blood vessels  17) spinach and bananas- high in potassium  -Provided lise of supplements that can help with hypertension:  1) magnesium: one high quality brand is Bioptemizers since it contains all 7 types of magnesium, otherwise over the counter magnesium gluconate 400mg  is a good option 2) B vitamins 3) vitamin D 4) potassium 5) CoQ10 6) L-arginine 7) Vitamin C 8) Beetroot -Educated that goal BP is 120/80. -Made goal to incorporate some of the above foods into diet.    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  -discussed that she had weaned off the steroids and had been doing fine off the steroids -discussed that aquatic therapy can help -discussed that she should encourage follow-up with rheumatology to assess for giant cell arteritis -[facility] -continue 2mg  steroids  16. Sciatica: -consider red light therapy -discussed that this has  improved -discussed aquatherapy -discussed that the spinal injection helped temporarily -discussed that she has been taking 4mg  of low dose naltrexone -discussed that she used some hydrocodone she had left over from the past.  -discussed lumbosacral orthosis, advised using for activities that involve bending/twisting/walking on uneven terrain -discussed that it would probably be better to repeat steroid injection than to continue hydrocodone -advised to use lidocaine patch -continue capsaicin cream  17. Family illness: -discussed that she lost 2 nephews recently  71. Depression:  -continue cymbalta -encouraged follow-up with psychiatry   19. Dizziness: -discussed that this has improved  20. 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

## 2023-10-05 NOTE — Patient Instructions (Addendum)
 Avoid processed foods, eggs, dairy, and gluten  Eat fruits and vegetables, herbs and spices  -Discussed following foods that may reduce pain: 1) Ginger (especially studied for arthritis)- reduce leukotriene production to decrease inflammation 2) Blueberries- high in phytonutrients that decrease inflammation 3) Salmon- marine omega-3s reduce joint swelling and pain 4) Pumpkin seeds- reduce inflammation 5) dark chocolate- reduces inflammation 6) turmeric- reduces inflammation 7) tart cherries - reduce pain and stiffness 8) extra virgin olive oil - its compound olecanthal helps to block prostaglandins  9) chili peppers- can be eaten or applied topically via capsaicin 10) mint- helpful for headache, muscle aches, joint pain, and itching 11) garlic- reduces inflammation 12) Green tea- reduces inflammation and oxidative stress, helps with weight loss, may reduce the risk of cancer, recommend Double Green Matcha [place] of Tea daily  Link to further information on diet for chronic pain: [email or web address]

## 2023-10-09 LAB — DRUG TOX ALC METAB W/CON, ORAL FLD: Alcohol Metabolite: NEGATIVE ng/mL (ref <25)

## 2023-10-09 LAB — DRUG TOX MONITOR 1 W/CONF, ORAL FLD
Amphetamines: NEGATIVE ng/mL (ref <10)
Barbiturates: NEGATIVE ng/mL (ref <10)
Benzodiazepines: NEGATIVE ng/mL (ref <0.50)
Buprenorphine: NEGATIVE ng/mL (ref <0.10)
Cocaine: NEGATIVE ng/mL (ref <5.0)
Codeine: NEGATIVE ng/mL (ref <2.5)
Dihydrocodeine: 8.2 ng/mL — ABNORMAL HIGH (ref <2.5)
Fentanyl: 3.31 ng/mL — ABNORMAL HIGH (ref <0.10)
Fentanyl: POSITIVE ng/mL — AB (ref <0.10)
Heroin Metabolite: NEGATIVE ng/mL (ref <1.0)
Hydrocodone: 112.7 ng/mL — ABNORMAL HIGH (ref <2.5)
Hydromorphone: NEGATIVE ng/mL (ref <2.5)
MARIJUANA: NEGATIVE ng/mL (ref <2.5)
MDMA: NEGATIVE ng/mL (ref <10)
Meprobamate: NEGATIVE ng/mL (ref <2.5)
Methadone: NEGATIVE ng/mL (ref <5.0)
Morphine: NEGATIVE ng/mL (ref <2.5)
Nicotine Metabolite: NEGATIVE ng/mL (ref <5.0)
Norhydrocodone: 3 ng/mL — ABNORMAL HIGH (ref <2.5)
Noroxycodone: NEGATIVE ng/mL (ref <2.5)
Opiates: POSITIVE ng/mL — AB (ref <2.5)
Oxycodone: NEGATIVE ng/mL (ref <2.5)
Oxymorphone: NEGATIVE ng/mL (ref <2.5)
Phencyclidine: NEGATIVE ng/mL (ref <10)
Tapentadol: NEGATIVE ng/mL (ref <5.0)
Tramadol: NEGATIVE ng/mL (ref <5.0)
Zolpidem: NEGATIVE ng/mL (ref <5.0)

## 2023-10-12 DIAGNOSIS — H35372 Puckering of macula, left eye: Secondary | ICD-10-CM | POA: Diagnosis not present

## 2023-10-12 DIAGNOSIS — H35033 Hypertensive retinopathy, bilateral: Secondary | ICD-10-CM | POA: Diagnosis not present

## 2023-10-12 DIAGNOSIS — H353112 Nonexudative age-related macular degeneration, right eye, intermediate dry stage: Secondary | ICD-10-CM | POA: Diagnosis not present

## 2023-10-12 DIAGNOSIS — H5319 Other subjective visual disturbances: Secondary | ICD-10-CM | POA: Diagnosis not present

## 2023-10-12 DIAGNOSIS — Z961 Presence of intraocular lens: Secondary | ICD-10-CM | POA: Diagnosis not present

## 2023-10-12 DIAGNOSIS — H43813 Vitreous degeneration, bilateral: Secondary | ICD-10-CM | POA: Diagnosis not present

## 2023-10-12 DIAGNOSIS — H353123 Nonexudative age-related macular degeneration, left eye, advanced atrophic without subfoveal involvement: Secondary | ICD-10-CM | POA: Diagnosis not present

## 2023-10-13 ENCOUNTER — Encounter (HOSPITAL_BASED_OUTPATIENT_CLINIC_OR_DEPARTMENT_OTHER): Payer: Self-pay | Admitting: Pulmonary Disease

## 2023-10-15 DIAGNOSIS — F4542 Pain disorder with related psychological factors: Secondary | ICD-10-CM | POA: Diagnosis not present

## 2023-10-26 DIAGNOSIS — H5319 Other subjective visual disturbances: Secondary | ICD-10-CM | POA: Diagnosis not present

## 2023-10-26 DIAGNOSIS — H43813 Vitreous degeneration, bilateral: Secondary | ICD-10-CM | POA: Diagnosis not present

## 2023-10-26 DIAGNOSIS — H35033 Hypertensive retinopathy, bilateral: Secondary | ICD-10-CM | POA: Diagnosis not present

## 2023-10-26 DIAGNOSIS — H353123 Nonexudative age-related macular degeneration, left eye, advanced atrophic without subfoveal involvement: Secondary | ICD-10-CM | POA: Diagnosis not present

## 2023-10-26 DIAGNOSIS — Z961 Presence of intraocular lens: Secondary | ICD-10-CM | POA: Diagnosis not present

## 2023-10-26 DIAGNOSIS — H353112 Nonexudative age-related macular degeneration, right eye, intermediate dry stage: Secondary | ICD-10-CM | POA: Diagnosis not present

## 2023-10-26 DIAGNOSIS — H35372 Puckering of macula, left eye: Secondary | ICD-10-CM | POA: Diagnosis not present

## 2023-10-28 ENCOUNTER — Other Ambulatory Visit: Payer: Self-pay | Admitting: Physical Medicine and Rehabilitation

## 2023-10-28 MED ORDER — HYDROCODONE-ACETAMINOPHEN 5-325 MG PO TABS: 1.0000 | ORAL_TABLET | Freq: Two times a day (BID) | ORAL | 0 refills | Status: DC | PRN

## 2023-11-03 DIAGNOSIS — M792 Neuralgia and neuritis, unspecified: Secondary | ICD-10-CM | POA: Diagnosis not present

## 2023-11-03 DIAGNOSIS — Z9682 Presence of neurostimulator: Secondary | ICD-10-CM | POA: Diagnosis not present

## 2023-11-03 DIAGNOSIS — G509 Disorder of trigeminal nerve, unspecified: Secondary | ICD-10-CM | POA: Diagnosis not present

## 2023-11-05 DIAGNOSIS — Z9682 Presence of neurostimulator: Secondary | ICD-10-CM | POA: Diagnosis not present

## 2023-11-05 DIAGNOSIS — M792 Neuralgia and neuritis, unspecified: Secondary | ICD-10-CM | POA: Diagnosis not present

## 2023-11-05 DIAGNOSIS — G509 Disorder of trigeminal nerve, unspecified: Secondary | ICD-10-CM | POA: Diagnosis not present

## 2023-11-10 ENCOUNTER — Ambulatory Visit: Admitting: Sports Medicine

## 2023-11-11 ENCOUNTER — Ambulatory Visit: Admitting: Family Medicine

## 2023-11-11 ENCOUNTER — Ambulatory Visit (INDEPENDENT_AMBULATORY_CARE_PROVIDER_SITE_OTHER): Admitting: Family Medicine

## 2023-11-11 VITALS — BP 122/68 | Ht 63.0 in | Wt 148.0 lb

## 2023-11-11 DIAGNOSIS — M7632 Iliotibial band syndrome, left leg: Secondary | ICD-10-CM | POA: Diagnosis not present

## 2023-11-11 MED ORDER — METHYLPREDNISOLONE ACETATE 40 MG/ML IJ SUSP
40.0000 mg | Freq: Once | INTRAMUSCULAR | Status: AC
Start: 2023-11-11 — End: 2023-11-11
  Administered 2023-11-11: 40 mg via INTRA_ARTICULAR

## 2023-11-11 NOTE — Progress Notes (Addendum)
 PCP: Imelda Man, MD  Subjective:   HPI: Patient is a 83 y.o. female here for left knee pain.  Reports she was in the car for the better part of the past week driving to [place] and back.  Reports she had significant difficulty getting in and out of the car.  Known left knee OA, sees Dr. Geralyn Knee for gel injections.  H/o polymyalgia rheumatica, on prednisone  2 mg daily and fibromyalgia.  Denies inciting trauma.  Pain is mostly on the lateral aspect of her knee.  Has been using a knee brace.  Reports that can extend down the lateral part of her leg.  Also feels she has aggravated the sciatica on the right side.  Past Medical History:  Diagnosis Date   Adenomatous colon polyp 03/24/2018   Allergy    Arthritis    Cataract    Chronic headaches    Depression    Fibromyalgia    Gallstones    GERD (gastroesophageal reflux disease)    Hyperlipemia    Hypertension    Interstitial cystitis    Lactose intolerance    LBBB (left bundle branch block)    Osteoarthritis    Osteopenia    Pneumonia    Thyroid  disease    Tinnitus    Trigeminal neuralgia     Current Outpatient Medications on File Prior to Visit  Medication Sig Dispense Refill   acetaminophen  (TYLENOL ) 500 MG tablet Take by mouth.     Acetylcysteine (NAC) 600 MG CAPS Take 1 capsule (600 mg total) by mouth 2 (two) times daily. 60 capsule 0   ACIDOPHILUS LACTOBACILLUS PO Take 1 tablet by mouth daily.     amLODipine (NORVASC) 2.5 MG tablet Take 2.5 mg by mouth daily.     amLODipine (NORVASC) 5 MG tablet Take 1 tablet by mouth once daily Orally Once a day for 90 days     benzocaine  (ORAJEL) 10 % mucosal gel Use as directed 1 application in the mouth or throat as needed for mouth pain. 5.3 g 0   calcium  carbonate (SUPER CALCIUM ) 1500 (600 Ca) MG TABS tablet 1 tablet with meals Orally Once a day     cetirizine (ZYRTEC) 10 MG chewable tablet Chew by mouth.     Cholecalciferol (VITAMIN D -3) 25 MCG (1000 UT) CAPS Take 2,000 Units by  mouth daily.     Cholecalciferol 125 MCG (5000 UT) capsule Take by mouth.     Cholecalciferol 50 MCG (2000 UT) CAPS Take by mouth.     cyanocobalamin (VITAMIN B12) 1000 MCG tablet Take by mouth.     denosumab  (PROLIA ) 60 MG/ML SOSY injection as directed Every 6 months     DULoxetine  (CYMBALTA ) 30 MG capsule Take 3 capsules (90 mg total) by mouth daily. 270 capsule 3   estradiol (ESTRACE) 0.1 MG/GM vaginal cream Place vaginally.     fentaNYL  (DURAGESIC ) 25 MCG/HR Place 1 patch onto the skin every 3 (three) days. 10 patch 0   fluticasone  (FLONASE  ALLERGY RELIEF) 50 MCG/ACT nasal spray 1 spray in each nostril Nasally Once a day     gabapentin  (NEURONTIN ) 100 MG capsule Take 100 mg by mouth 4 (four) times daily.     HYDROcodone -acetaminophen  (NORCO/VICODIN) 5-325 MG tablet Take 1 tablet by mouth 2 (two) times daily as needed for moderate pain (pain score 4-6). 60 tablet 0   levothyroxine  (SYNTHROID ) 75 MCG tablet Take 75 mcg by mouth daily before breakfast.     magnesium gluconate (MAGONATE) 500 MG tablet Take 500  mg by mouth 2 (two) times daily.     Methylcobalamin 1000 MCG SUBL      naloxone  (NARCAN ) nasal spray 4 mg/0.1 mL Place 1 spray into the nose once.     NONFORMULARY OR COMPOUNDED ITEM Apply 1 Pump topically 4 (four) times daily as needed. Ketamine 10%, Baclofen  2%, Cyclobenzaprine 2%, Ketoprofen 10%, Gabapentin  6%, Bupivacaine 1%, Amitryptiline 5% Dispense 100mg  100 each 3   ondansetron  (ZOFRAN ) 4 MG tablet 1 tablet as needed Orally Twice a day for 30 days     pantoprazole  (PROTONIX ) 40 MG tablet Take 40 mg by mouth daily.     Polyethyl Glycol-Propyl Glycol (SYSTANE FREE OP) Apply 1 drop to eye as needed.     predniSONE  (DELTASONE ) 1 MG tablet Take 2 tablets (2 mg total) by mouth daily with breakfast. 60 tablet 0   Propylene Glycol (SYSTANE COMPLETE PF OP) Apply to eye.     Pyridoxine  HCl (VITAMIN B-6 PO) Vitamin B6     RA GLUCOSAMINE SULFATE 500 MG TABS      rosuvastatin (CRESTOR) 5 MG  tablet Take 5 mg by mouth daily.     Vitamin D , Ergocalciferol , (DRISDOL ) 1.25 MG (50000 UNIT) CAPS capsule Take 1 capsule (50,000 Units total) by mouth every 7 (seven) days. 20 capsule 0   No current facility-administered medications on file prior to visit.    Past Surgical History:  Procedure Laterality Date   CHOLECYSTECTOMY  03/2010   COLONOSCOPY  2009   Dr. Jayne Mews    CYSTOSCOPY     Multiple Cystoscopies with stents or biopsy    ESOPHAGOGASTRODUODENOSCOPY  10/2013   EYE SURGERY     bilateral cataract removal   Gamma knife radiation  01/2018   for trigeminal neuralgia   REFRACTIVE SURGERY Bilateral 2022   RHIZOTOMY Left 04/28/2021   Procedure: Left Percutaneous trigeminal balloon rhizotomy;  Surgeon: Cannon Champion, MD;  Location: [facility] OR;  Service: Neurosurgery;  Laterality: Left;   TONSILLECTOMY      Allergies  Allergen Reactions   Dipyridamole Other (See Comments)    Migraines  Other reaction(s): migraine  Persantine  Other reaction(s): Headache  Migraines    Migraines  Other reaction(s): migraine Persantine Other reaction(s): migraine Other reaction(s): Headache Other reaction(s): migraine  Migraines  Migraines  Other reaction(s): migraine Persantine Other reaction(s): migraine Other reaction(s): Headache Other reaction(s): migraine    Migraines    Migraines  Other reaction(s): migraine Persantine Other reaction(s): migraine Other reaction(s): Headache Other reaction(s): migraine   Ciprofloxacin Other (See Comments)    Interstitial cystitis, worsening renal function   Diclofenac Other (See Comments)    Other Reaction(s): hives   Diclofenac Sodium Other (See Comments) and Hives    Other reaction(s): hives Other reaction(s): hives Other reaction(s): hives   Hydrochlorothiazide  Nausea Only    Chills  Taking at this time lower dose  Chills Taking at this time lower dose  Chills Taking at this time lower dose    Chills Taking at this time lower  dose   Other Other (See Comments)    Cat and Dog Dander  Other reaction(s): bladder pain  Other reaction(s): muscle cramps  Other reaction(s): Unknown  Cat and Dog Dander  Other reaction(s): bladder pain Other reaction(s): muscle cramps Other reaction(s): Unknown  Other Reaction(s): Other (See Comments)  Cat and dog dander/reaction is runny nose sneezing   Oxycodone Hcl Other (See Comments)    Other reaction(s): bladder pain  Other Reaction(s): other   Pantoprazole  Other (See Comments)  Other reaction(s): increased tremors Other reaction(s): increased tremors Other reaction(s): increased tremors  Other Reaction(s): Other (See Comments)  Reaction: Tremor (intolerance)   Pantoprazole  Sodium Other (See Comments)    Other reaction(s): increased tremors  Other Reaction(s): increased tremors   Triamterene Other (See Comments)    Made Tremors worse  Other reaction(s): Other (See Comments)  Made Tremors worse Other reaction(s): Other (See Comments)   Triamterene-Hctz Other (See Comments)    Other reaction(s): muscle cramps   Buprenorphine Other (See Comments)    "knocks me out"  Other reaction(s): Unknown  Butrans  Other Reaction(s): Drowsiness, Not available, Unknown  "knocks me out"    "knocks me out" Other reaction(s): Unknown Butrans Other reaction(s): Unknown Other reaction(s): Unknown   Codeine Nausea Only and Other (See Comments)    Made her feel loopy  Other reaction(s): nausea  Other Reaction(s): GI Intolerance, Other (See Comments), Unknown  Made her feel loopy Other reaction(s): nausea Other reaction(s): nausea  Made her feel loopy Other reaction(s): nausea Other reaction(s): nausea    Made her feel loopy Other reaction(s): nausea Other reaction(s): nausea   Diclofenac Sodium Hives, Other (See Comments) and Rash    Other reaction(s): hives Other reaction(s): hives Other reaction(s): hives  Other reaction(s): hives Other reaction(s): hives Other  reaction(s): hives    Other reaction(s): hives Other reaction(s): hives Other reaction(s): hives   Doxycycline Other (See Comments) and Rash    Other reaction(s): rash  Other reaction(s): rash Other reaction(s): rash Other reaction(s): rash  Other reaction(s): rash Other reaction(s): rash Other reaction(s): rash    Other reaction(s): rash Other reaction(s): rash Other reaction(s): rash   Esomeprazole Other (See Comments)    Jittery, sore throat  Other reaction(s): jittery, sore throat  Other reaction(s): Other  Other Reaction(s): jittery, sore throat  Jittery, sore throat Jittery, sore throat    Jittery, sore throat Other reaction(s): jittery, sore throat Other reaction(s): jittery, sore throat Other reaction(s): Other Other reaction(s): jittery, sore throat  Jittery, sore throat Jittery, sore throat  Jittery, sore throat Other reaction(s): jittery, sore throat Other reaction(s): jittery, sore throat Other reaction(s): Other Other reaction(s): jittery, sore throat    Jittery, sore throat Jittery, sore throat    Jittery, sore throat Other reaction(s): jittery, sore throat Other reaction(s): jittery, sore throat Other reaction(s): Other Other reaction(s): jittery, sore throat  Other Reaction(s): Other (See Comments)  Jittery, sore throat, Jittery, sore throat   Hydrochlorothiazide -Triamterene Hives, Other (See Comments) and Rash    Muscle Cramps  Other reaction(s): muscle cramps  Other reaction(s): Other  Other Reaction(s): muscle cramps  Cramps Cramps    Muscle Cramps  Other reaction(s): muscle cramps Other reaction(s): Other  Other Reaction(s): Other (See Comments)  Cramps, Cramps   Irbesartan  Diarrhea, Other (See Comments) and Rash    Other reaction(s): loose stools  Other Reaction(s): loose stools  Other reaction(s): loose stools Other reaction(s): loose stools Other reaction(s): loose stools Other reaction(s): loose stools  Other reaction(s): loose stools  Other reaction(s): loose stools Other reaction(s): loose stools Other reaction(s): loose stools    Other reaction(s): loose stools Other reaction(s): loose stools Other reaction(s): loose stools Other reaction(s): loose stools   Lactose Other (See Comments)    GI upset   Lactose Intolerance (Gi) Other (See Comments)    GI upset    Lactulose Other (See Comments), Hives and Rash    GI upset   Losartan Hives, Other (See Comments) and Rash    Hot and chills  Other reaction(s):  hot and chills  Other reaction(s): flushing  Other Reaction(s): Fever, hot and chills  Hot and chills Hot and chills    Hot and chills Other reaction(s): hot and chills Other reaction(s): hot and chills Other reaction(s): hot and chills Other reaction(s): flushing Other reaction(s): hot and chills  Other Reaction(s): Other (See Comments)  Hot and chills, Hot and chills   Naproxen Other (See Comments), Hives and Rash    Other reaction(s): worsens interstitial cystitis  Other Reaction(s): worsens interstitial cystitis  Worsens IC symptoms    Other reaction(s): worsens interstitial cystitis Other reaction(s): worsens interstitial cystitis Other reaction(s): worsens interstitial cystitis   Nortriptyline Hives and Other (See Comments)    "My body doesn't metabolize it anymore." Other reaction(s): Unknown Other reaction(s): Unknown Other reaction(s): Unknown Other reaction(s): Unknown   Nortriptyline Hcl Other (See Comments)    "My body doesn't metabolize it anymore."  Other reaction(s): Unknown   Oxycodone Other (See Comments)    Interstitial cystitis  Bladder pain  Other reaction(s): bladder pain  Other reaction(s): other  Other Reaction(s): bladder pain  Interstitial cystitis    Interstitial cystitis Bladder pain Other reaction(s): bladder pain Other reaction(s): other    Other reaction(s): bladder pain  Other Reaction(s): Other (See Comments)   Oxycontin [Oxycodone Hcl] Other (See Comments)     Interstitial cystitis    Pennsaid [Diclofenac Sodium] Hives   Telmisartan Hives, Other (See Comments) and Rash    headache  Other reaction(s): sinus pain, ha's  Other reaction(s): headache  Other Reaction(s): Headache, sinus pain, ha's  headache headache    headache Other reaction(s): sinus pain, ha's Other reaction(s): sinus pain, ha's Other reaction(s): sinus pain, ha's Other reaction(s): headache Other reaction(s): sinus pain, ha's  Other Reaction(s): Other (See Comments)  headache, headache   Tramadol Itching and Other (See Comments)    sedation  Other reaction(s): sedation/itching  Other Reaction(s): sedation/itching  sedation sedation    sedation Other reaction(s): sedation/itching Other reaction(s): sedation/itching Other reaction(s): sedation/itching Other reaction(s): sedation/itching  sedation, sedation    BP 122/68   Ht 5\' 3"  (1.6 m)   Wt 148 lb (67.1 kg)   BMI 26.22 kg/m      06/03/2021   11:13 AM  [facility] Adult Exercise  Frequency of aerobic exercise (# of days/week) 0  Average time in minutes 0  Frequency of strengthening activities (# of days/week) 0        No data to display              Objective:  Physical Exam:  Gen: NAD, comfortable in exam room MSK: Left knee: -No erythema, edema or ecchymosis noted -TTP over lateral knee/distal IT band at lateral femoral condyle -5/5 hip flexion and knee flexion/extension bilaterally -Negative varus, valgus, anterior and posterior drawer -Negative McMurray -Positive Ober and Noble test  Bedside US : Small area of edema and inflammation over lateral femoral condylar region.  IT band intact.   Assessment & Plan:  1. Left knee pain: Secondary to distal IT band syndrome and possibly underlying knee OA contributing.  Received steroid injection between the distal IT band and femur.  Supportive care with ice and home exercises.  Follow-up in 1 month.  PROCEDURE:  Injection Patient was given informed consent, signed copy in the chart. Appropriate time out was taken. Area prepped and draped in usual sterile fashion. Ethyl chloride was  used for local anesthesia. 2 cc of methylprednisolone  40 mg/ml plus 1 cc of 1% lidocaine  without epinephrine was  injected between the distal IT band and femur using a(n) lateral approach.   The patient tolerated the procedure well. There were no complications. Post procedure instructions were given.

## 2023-11-11 NOTE — Patient Instructions (Signed)
 You have IT band syndrome. Avoid painful activities as much as possible. Ice over area of pain 3-4 times a day for 15 minutes at a time Do home exercises and stretches daily as directed. You were given an injection for this today. Follow up with me in 1 month.

## 2023-11-12 ENCOUNTER — Other Ambulatory Visit: Payer: Self-pay

## 2023-11-12 MED ORDER — FENTANYL 25 MCG/HR TD PT72: 1.0000 | MEDICATED_PATCH | TRANSDERMAL | 0 refills | Status: DC

## 2023-11-16 DIAGNOSIS — F4542 Pain disorder with related psychological factors: Secondary | ICD-10-CM | POA: Diagnosis not present

## 2023-11-17 ENCOUNTER — Encounter (HOSPITAL_BASED_OUTPATIENT_CLINIC_OR_DEPARTMENT_OTHER): Payer: Self-pay | Admitting: Physical Therapy

## 2023-11-17 ENCOUNTER — Other Ambulatory Visit: Payer: Self-pay

## 2023-11-17 ENCOUNTER — Ambulatory Visit (HOSPITAL_BASED_OUTPATIENT_CLINIC_OR_DEPARTMENT_OTHER): Attending: Pulmonary Disease | Admitting: Physical Therapy

## 2023-11-17 DIAGNOSIS — M25562 Pain in left knee: Secondary | ICD-10-CM | POA: Insufficient documentation

## 2023-11-17 DIAGNOSIS — R293 Abnormal posture: Secondary | ICD-10-CM | POA: Diagnosis not present

## 2023-11-17 DIAGNOSIS — M25561 Pain in right knee: Secondary | ICD-10-CM | POA: Diagnosis not present

## 2023-11-17 DIAGNOSIS — R29898 Other symptoms and signs involving the musculoskeletal system: Secondary | ICD-10-CM | POA: Insufficient documentation

## 2023-11-17 DIAGNOSIS — M5459 Other low back pain: Secondary | ICD-10-CM | POA: Diagnosis not present

## 2023-11-17 DIAGNOSIS — M6281 Muscle weakness (generalized): Secondary | ICD-10-CM | POA: Diagnosis not present

## 2023-11-17 DIAGNOSIS — G8929 Other chronic pain: Secondary | ICD-10-CM | POA: Insufficient documentation

## 2023-11-17 NOTE — Therapy (Signed)
 OUTPATIENT PHYSICAL THERAPY THORACOLUMBAR EVALUATION   Patient Name: [name] MRN: [id number] DOB:May 14, 1941, 83 y.o., female Today's Date: 11/17/2023  END OF SESSION:  PT End of Session - 11/17/23 1302     Visit Number 1    Date for PT Re-Evaluation 01/14/24    Authorization Type medicare    Progress Note Due on Visit 10    PT Start Time 1150    PT Stop Time 1235    PT Time Calculation (min) 45 min    Activity Tolerance Patient tolerated treatment well    Behavior During Therapy [facility] for tasks assessed/performed             Past Medical History:  Diagnosis Date   Adenomatous colon polyp 03/24/2018   Allergy    Arthritis    Cataract    Chronic headaches    Depression    Fibromyalgia    Gallstones    GERD (gastroesophageal reflux disease)    Hyperlipemia    Hypertension    Interstitial cystitis    Lactose intolerance    LBBB (left bundle branch block)    Osteoarthritis    Osteopenia    Pneumonia    Thyroid  disease    Tinnitus    Trigeminal neuralgia    Past Surgical History:  Procedure Laterality Date   CHOLECYSTECTOMY  03/2010   COLONOSCOPY  2009   Dr. Jayne Mews    CYSTOSCOPY     Multiple Cystoscopies with stents or biopsy    ESOPHAGOGASTRODUODENOSCOPY  10/2013   EYE SURGERY     bilateral cataract removal   Gamma knife radiation  01/2018   for trigeminal neuralgia   REFRACTIVE SURGERY Bilateral 2022   RHIZOTOMY Left 04/28/2021   Procedure: Left Percutaneous trigeminal balloon rhizotomy;  Surgeon: Cannon Champion, MD;  Location: [facility] OR;  Service: Neurosurgery;  Laterality: Left;   TONSILLECTOMY     Patient Active Problem List   Diagnosis Date Noted   Decreased diffusion capacity 08/21/2018   Trigeminal neuralgia    Lumbar back pain with radiculopathy affecting right lower extremity 01/08/2014   Dizzinesses 01/08/2014    PCP: Imelda Man MD  REFERRING PROVIDER: Quillian Brunt, MD   REFERRING DIAG: [id number] (ICD-10-CM) -  Muscular deconditioning   Rationale for Evaluation and Treatment: Rehabilitation  THERAPY DIAG:  Muscle weakness (generalized)  Other low back pain  Chronic pain of both knees  Abnormal posture  ONSET DATE: exacerbation x 6 months  SUBJECTIVE:  SUBJECTIVE STATEMENT: "My head started to hurt so bad after I was dc last time I did not follow up with completing my home programs.  I have gotten weak.  The trigeminal neuralgia is bad and not getting any better with the stimulator.  They want to do another surgery.  My councilor wanted me to come back here.  PERTINENT HISTORY:  Trigeminal neuralgia chronic with stimulator placed Fibromyalgia Polymyalgia rheumatica  PAIN:  Are you having pain? Yes Pain location:  general below neck:LB and knees current 2/10; worst 5/10  Trigeminal neuralgiaL: left sided face current 8/10  Pain description: ache, throb; stabbing Aggravating factors: Body: walking; standing Head sharp; running/trickling water sensation Relieving factors: stimulator; meds  PRECAUTIONS: Fall  RED FLAGS: Motor cortex stimulator   WEIGHT BEARING RESTRICTIONS: No  FALLS:  Has patient fallen in last 6 months? No  LIVING ENVIRONMENT: Lives with: lives with their family and lives with their spouse Lives in: House/apartment Stairs: Yes: Internal: 16 steps; on right going up Has following equipment at home: None   OCCUPATION: retired   PLOF: Independent  PATIENT GOALS: improve strength, return to exercise routine  NEXT MD VISIT: as needed  OBJECTIVE:  Note: Objective measures were completed at Evaluation unless otherwise noted.  DIAGNOSTIC FINDINGS:  X-Ray 10/23 Moderate tricompartmental degenerative changes   PATIENT SURVEYS:  Modified Oswestry To be completed next  session/ran out of time   COGNITION: Overall cognitive status: Within functional limits for tasks assessed                         SENSATION: WFL   EDEMA: none   POSTURE: forward head; left knee valgus deformity; decreased lumbar lordosis   PALPATION: No tenderness  LUMBAR ROM:   Wfl pain with return to upright from flexion  LOWER EXTREMITY ROM:     wfl  LOWER EXTREMITY MMT:     Completed in sitting HD lb Right eval Left eval  Hip flexion 33.8 28.4  Hip extension    Hip abduction 19.1 17.2  Hip adduction    Hip internal rotation    Hip external rotation    Knee flexion    Knee extension 41.4 40.5  Ankle dorsiflexion    Ankle plantarflexion    Ankle inversion    Ankle eversion     (Blank rows = not tested)   FUNCTIONAL TESTS:  Timed up and go (TUG): 14.59 BERG Balance Test           Sit to Stand 3  Standing unsupported 4  Sitting with back unsupported but feet supported 4  Stand to sit  3  Transfers  3  Standing unsupported with eyes closed 4  Standing unsupported feet together 4  From standing position, reach forward with outstretched arm 4  From standing position, pick up object from floor 4  From standing position, turn and look behind over each shoulder 4  Turn 360 4  Standing unsupported, alternately place foot on step 2  Standing unsupported, one foot in front 1  Standing on one leg 1  Total:  45     GAIT: Distance walked: 500 ft Assistive device utilized: None Level of assistance: Complete Independence Comments: decreased step length and cadence, knees knocking. Left hip rotation  TREATMENT  Eval Self care:Posture and body mechanic handout and instruction; Hours of [employer] operation for pt use, walking at home daily for exercise. Use of gym for socialization to assist with pain control.  NEXT SESSION: complete ODI                                                                                                                 PATIENT EDUCATION:  Education details: Discussed eval findings, rehab rationale, aquatic program progression/POC and pools in area. Patient is in agreement  Person educated: Patient Education method: Explanation Education comprehension: verbalized understanding  HOME EXERCISE PROGRAM: [id number] from last session  ASSESSMENT:  CLINICAL IMPRESSION: Patient is a 83 y.o. f who was seen today for physical therapy evaluation and treatment for muscular deconditioning.  Pt with multiple co morbidities including fibromyalgia, Polymyalgia rheumatica as well a trigeminal neuralgia.  The later has been the main limiting issue with follow through of prior HEP as instructed about 1 year ago.  She reports no falls just significant pain which has limited her function.  She presents with decreased strength, balance and general pain from myalgias .  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  She will benefit from skilled physical therapy intervention both aquatic and then progression to land when able.  She does report having lists of HEP's so land based will focus on re-teaching program to ensure proper completion. Aquatics to work on strengthening, balance and pain management.  OBJECTIVE IMPAIRMENTS: decreased balance and decreased strength.    ACTIVITY LIMITATIONS: locomotion level   PARTICIPATION LIMITATIONS: community activity and playing tennis   PERSONAL FACTORS: Age and 1-2 comorbidities: OA and trigeminal neuralgia are also affecting patient's functional outcome.    REHAB POTENTIAL: Good   CLINICAL DECISION MAKING: Evolving/moderate complexity   EVALUATION COMPLEXITY: Moderate   GOALS: Goals reviewed with patient? Yes  SHORT TERM GOALS: Target date: 12/10/23  Pt will tolerate full aquatic sessions consistently without increase in pain and with improving function to demonstrate good toleration and effectiveness of intervention.   Baseline: Goal status: INITIAL  2.  Pt will complete 10 minutes of continuous amb submerged to demonstrate improvement on endurance Baseline:  Goal status: INITIAL  3.  Pt to report 0/10 pain in knees and back with exercising submerged Baseline:  Goal status: INITIAL   LONG TERM GOALS: Target date: 01/14/24  Pt to improve on ODI by  13% to demonstrate statistically significant Improvement in function. Baseline:  Goal status: INITIAL  2.  Pt will be indep with final HEP's (land and aquatic as appropriate) for continued management of condition Baseline:  Goal status: INITIAL  3.  Pt will report decrease in general pain (not trigeminal neuralgia) by at least 50% for improved toleration to activity/quality of life and to demonstrate improved management of pain. Baseline:  Goal status: INITIAL  4.  Pt will improve strength in hips by  5lbs to demonstrate improved overall physical function Baseline:  Goal status: INITIAL  5.  Pt will perform SLS in 3.6 ft (ue support of hand buoys as needed) x 15s or> improvement in balance ability Baseline:  Goal status: INITIAL  6.  Pt will improve on  Tug test to <or= 12s  to demonstrate improvement in lower extremity function, mobility and decreased fall risk. Baseline: 14.59 Goal status: INITIAL  PLAN:  PT FREQUENCY: 1-2x/week  PT DURATION: 8 weeks  PLANNED INTERVENTIONS: 97164- PT Re-evaluation, 97110-Therapeutic exercises, 97530- Therapeutic activity, [id number]- Neuromuscular re-education, 97535- Self Care, [id number]- Manual therapy, [phone]- Gait training, [phone]- Aquatic Therapy, Patient/Family education, Balance training, Stair training, Taping, Dry Needling, DME instructions, Cryotherapy, and Moist heat.  PLAN FOR NEXT SESSION: land based will focus on re-teaching program to ensure proper completion. Aquatics to work on strengthening, balance and pain management.   Adriana Hopping [place]) Katelind Pytel MPT 11/17/23 1:04 PM [facility]  [facility] [address] [place], [place], [address] Phone: [phone]   Fax:  [phone]

## 2023-11-19 ENCOUNTER — Encounter: Payer: Self-pay | Admitting: Physical Medicine and Rehabilitation

## 2023-11-19 ENCOUNTER — Encounter: Attending: Physical Medicine and Rehabilitation | Admitting: Physical Medicine and Rehabilitation

## 2023-11-19 VITALS — BP 127/76 | HR 74 | Ht 63.0 in | Wt 145.2 lb

## 2023-11-19 DIAGNOSIS — M170 Bilateral primary osteoarthritis of knee: Secondary | ICD-10-CM | POA: Diagnosis not present

## 2023-11-19 DIAGNOSIS — G500 Trigeminal neuralgia: Secondary | ICD-10-CM | POA: Insufficient documentation

## 2023-11-19 MED ORDER — HYDROCODONE-ACETAMINOPHEN 5-325 MG PO TABS: 1.0000 | ORAL_TABLET | Freq: Three times a day (TID) | ORAL | 0 refills | Status: DC | PRN

## 2023-11-19 NOTE — Progress Notes (Signed)
 Subjective:    Patient ID: [name], female    DOB: Nov 12, 1940, 83 y.o.   MRN: [id number]  HPI   [name] presents today for f/u of severe trigeminal neuralgia.   1) Severe trigeminal neuralgia: -she is not doing as well as she had hoped she would after visiting [name] -they had changed some programs and these had really helped while she was in [place] -she stayed with her son in [place]  after her trip to [place] and got to see her great grandson! But after she got home her pain got so much worse. She is not sure if the distraction of family helped her -she and [name] got really tried during the trip- the floors were slick in the shower -today pain is 8/10 -she needs refill of fentanyl  patch -mouth gets dry from gabapentin  -she is planning to see [name] at [facility] again -she went to [facility] 2 weeks ago -Friday or Saturday night it was time for her to have another patch and her husband felt she should go off the patch -she is doing well with hydrocodone  and fentanyl  patch -had been unable to go to the gym when her pain was severe -she is not doing very well -they had to cancel one of the appointments due to snow -the next week [name] who is doing her programming had the flu -she turned off the device for 2 weeks as it was making her pain worse -she had an appointment with [name] on Monday and tried new programs and they seemed to be working better -she is very happy with her new [facility]ician but has not found any helpful settings yet -she turns off the stimulator at night as she can sleep well at night -if she turns off the stimulator during the day she has 1 hour of relief and then the old pain returns -the last two or three days she has had bad pain -she is filling out pain diaries -she was able to cook Thanksgiving dinner but felt a lot of pain the next day -she is doing ok -she likes her new physician at [facility]. He works a lot with the [employer] rep because the rep is just for back pain -he takes pictures and asks for the location to be adjusted -at [facility] she is just given one program and then it stops working.  -Saw [name] 8 weeks ago and the new programs worked but then it stops working when she returns home -she is seeing neurosurgeon [name] on Thursday  [name] researched red light therapy -she went to see the doctor in [place]  and was only been able to spend 5 minutes with her -they did about 15 minutes of adjustments and the new programs seemed to hurt more than they help -has had stimulator turned off since last night -was able to enjoy her birthday -she did well for 3 weeks but recently pain has been around 8-10 -when pain is very severe it makes her eye hurt -the way her stimulator is set it makes her nose hurt She is going back to [name] on April 10th -she is managing with the gabapentin  and had to take 2 extra yesterday and it did not make her sleepier  -pain fluctuates  -she went to [place] last week with her children.  -she keeps a journal of when she changes the program and what her pain level is in her teeth, eyes, and cheek. She put in not to use  the teeth program -she said if they could hit the nerve between the two teeth that would be helpful -they have her an 11 and 12 in that region,  -she cannot get the right level of stimulation. Before she left she decreased it by 50%. Now she is on a cheat program and the lowest amount. It is helping the teeth but hurting the eye.  -[name] had a baby on 11/14 and she is not coming back until January.  -she talked to the representative -last night she turned off the stimulator -she slept almost day today and did not want to wake up for supper which was rare for her -she is having a difficult time weaning off the hydrocodone .  -she has been off of it in two days -she is expecting a return call from [name] team soon to discuss wean off fentanyl  -drove up on  Tuesday and on Wednesday was supposed to have an appointment with pain psychologist. When they got there they were told that appointment had been changed to virtual. The next day they had a more sophisticated MRI. Deatra Face did moving in MRI so MRI was not as helpful. The next day they saw [name] and discussed several procedural options and then went back to the motor stimulator- that is scheduled on October 16th.  -she recently went on vacation and had to spend the majority of the vacation in bed due to the severity of her pain -she stopped using the intranasal ketamine because although it helped initially, it appeared to make her pain worse later -she asks if she can wean off some medication as she had a recent appointment with [name] and was told she may have a lot of postoperative pain if she is taking so much pain medication. She would still like to do this and also experiences sedation from her medication. She asks how she would go about weaning her medications -she asks if she should wean off the gabapentin  since this makes her so sleepy She asks if she should restart Topamax . -she was encouraged by her phone visit with [name] and plans to pursue surgery. She is going to [facility] next week for Brain MRI, neuropsych eval, and to discuss the risks and benefits of the procedure with [name] -pain was so severe in the past that she expressed suicidality to her husband [name].  -she feels that none of her medications are helpful but she knows that when she tries to wean them her pain worsens.  -she asks about refill of the compounding cream -her husband asks about referral to ketamine clinic for depression rather than pain -she went back to the dentist because it was urting between two teeth and he said there is nothing wrong with those teeth and it is the trigeminal nerve When she took a shower that usually seems to help with but was migrating around her face.  -appointment with neurology in  July -[name] cancelled the zoom call in April but this was rescheduled to July.  -she finds that only the gabapentin  helps -she is not sure if the fentanyl  helps When she is feeling really bad she tries to take another one but she gets very sleepy and dizzy.  -she is taking 8 a day of 100mg  per day- she takes two gabapentin  and tylenol  and hydrocodone  in the morning. She takes another two at breakfast around 11, another 2 at lunch around 1:30pm.  -she had a recent root canal and pain  has become much more severe after that -her pain is in the two teeth next to where she had the root canal -Oragel heps for a bout an hour.  -she tried increasing her Gabapentin  to 300mg  TID but she could not tolerate this. It made her too sleepy.  -she contacted Dr. Crecencio Dodge and he recommended she pause wean of Fentanyl  patch and I agree with this -she took last patch last night. She still has 12.5mcg patch available -her husband asks whether she should take a higher dose of gabapentin  -the fentanyl  patch makes her very sleepy and so allows her to sleep. It has also made her dizzier.  -the 50mcg fentanyl  patch did not provide more relief- she would like to wean off. Weaned to 25, then 12.71mcg, then off this week -referral was to [facility] and not to Dr. Moses Arenas she states and they need a new referral send to Dr. Lorretta Rossetti -she is using the compound cream and this helps- she uses this twice per day -capsaicin  made it burn  -lamotrigine  makes her too sleepy and dizzy to come down the stairs by herself and she does not feel comfortable going to the shower -left eye more open than usual. -she does not feel she is getting relief from the fentanyl  patch -she feels that the procedure at [facility] is a last resort.  -Given darkening of skin and worsening eye lid closure, MRI brain stat obtained and shows new cyst that could be post-surgical. Recommended she go to ED given worsening dizziness and facial  swelling as well. ED physician discussed case with NSGY and neurology and discharged home recommending follow-up with Dr. Vance Gell and Dr. Raul Cabot. She was given IV Fentanyl  in the ED which helped and discharged on 12mcg Fentanyl  patch, which has not helped.  -discussed increasing Fentanyl  patch to 25mcg since she has not experienced any negative side effects, no respiratory depression, and her husband is agreeable. I have sent in new script to start Saturday, but insurance required prior auth, which was approved.  -tonight would be the third tay of the 25mcg Fentanyl  patch -she is still taking the Gabapentin  -she feels that when she lays down she is spinning.  -eye continues to appear more closed.  -Husband has called Dr. Oral Billings and Dr. Diantha Fossa office to scheduled follow-up appointments and has appointments with both tomorrow.  -Will run out of Fentanyl  patch by Sunday -pain is worsening and nothing is providing relief -she asks whether she should go to ED -she has also noted dizziness and is not sure if this is from being in the MRI or from the Savella .  -morphine  was not effective and caused itching, sarna lotion did help wit this.  -hydrocodone  has been most effective thus far -nucynta  was not effective but she is not sure whether she gave it a fair trial -she has month appointments scheduled with Emilia Harbour and f/u with me in Feb -she used the Norco last night and this morning with good benefit and has started to feel the pain return this afternoon -she did find benefit from the compound cream- she continues to use this.  -She continues to have a sharp shooting pain doing around her eyelid and the tip teeth and in the top of her head.  -has a radiofrequency ablation treatment that helped but not completely -she is not having as much sharp knife-like pin like in her eye Her top teeth in her left law have been hurting really badly.  -she now takes  the baclofen  with the gabapentin  and  they seem to work well together.  -she had surgical procedure and feels worse -yesterday she took a half a tablet but this morning she took a whole one. She does fine with the hydrocodone  but a whole one makes her too sleepy.  -she is taking 2 100mg  Gabapentin  three times per day but it makes her dizzy and sleepy so she cannot funciton -the cold worsens her pain.  -The IV fentanyl  did help with the pain.  -her husband feels she should get one more dose of the 25mg  fentanyl . -she did get one week benefit from the steroid injection.  -she is doing terribly -she is miserable -she feels depressed due to the pain -she has considered taking the whole bottle of hydrocodone  because because she can't take this pain anymore.  -she started taking the Gabapentin  300mg  TID.  -she does find relief from the hydrocodone  -the pain is constant -she wonders if it is inflammation.  -she has not tried turmeric. Her son-in-law gave her a bottle and she couldn't stomach it.   2) Fibromyalgia: -She has been almost nonfunctional due to the severity of her pain. It continues to be very severe.  -She would like to wean off the Gabapentin  as she feels at risk of falls -she is taking Topamax  BID. She felt this was helpful but made her unsteady on her feet.  -still using gabapentin  and this is helping.  -she feels so fatigued that she can only have a shower every other day.   3) Osteoporosis: -husband asks whether she should get a Prolia  shot.   4) Dizziness: -head was swimming -she felt like she was going to fall.  -worsening -feels like she is spinning, not the room  -She tried the Phenytoin  for 2 days and did not feel benefit so stopped it.   -she feels the gabapentin  aggravates this and she would love to wean off but knows it helps her.   -She feels very sleepy during the day.   -She has never tried Topirmate before.   5) Sore throat -developed stopping lamotrigine  since not helping.   Prior  history:  Her pain continues to be in her left eye, forehead. She has not tried any topicals for pain. She uses capsaicin  for her back and knees.   Since last visit nothing we have tried has helped with her pain. She has tried to decrease her Gabapentin  dose but then her pain increased so she has resumed taking 4 Gabapentin  per day.   Her husband asks whether he should contact her surgeon regarding whether nerve may have become unclamped.   Discussed prolotherapy, trigger point injections, Baclofen , Clonazepam, Phenytoin  as other options we could try,=.  Pain continues to be excruciating and she states she is not sure how much longer she can live like this.   Prior history: She has having some nausea, lightheadedness and felt this may be related to her Gabapentin . She did not take any yesterday and then felt more sever pain in the evening. She also gets electrical shooting across her jaw.   She does not feel that she has cluster headaches as the pain has been so constant. She feels that she is getting worse. The oxygen made her feel relaxed but it is not helping.   The Sarna lotion does help with the itching in her head.   She was willing to try the Amitriptyline  but it makes her too sleepy in the morning.   Her  teeth and left eye incredibly hurt.   She feels that the Gabapentin  has been making her dizziness.   She asks about biofeedback.   The pain is 7/10 on average, similar to last time.   When the pain is severe it can make her depressed.    6) Macular degeneration:  Has been following with ophthalmology  7) Family illness: -she lost two nephews recently  8) Knee osteoarthritis: -had a cortisone shot and this really helped  Pain Inventory Average pain 9 Pain Right Now 9 My pain is constant, tingling, sharp, dull, stabbing, aching, numbness, burning  In the last 24 hours, has pain interfered with the following? General activity 10 Relation with others 10 Enjoyment  of life 10  What TIME of day is your pain at its worst? Morning anf evening Sleep (in general) Good  Pain is worst with  bending & standing, walking, sitting, inactivity, some activities  Pain improves with rest, heat, medication, stimulator therapy, heated rice  Relief with medication is  6   Family History  Problem Relation Age of Onset   Heart disease Mother    Heart disease Father    Stroke Father    Stroke Sister    Asthma Daughter    Colon cancer Neg Hx    Social History   Socioeconomic History   Marital status: Married    Spouse name: Not on file   Number of children: 2   Years of education: college   Highest education level: Not on file  Occupational History   Occupation: housewife    Employer: RETIRED  Tobacco Use   Smoking status: Never   Smokeless tobacco: Never  Vaping Use   Vaping status: Never Used  Substance and Sexual Activity   Alcohol use: No    Alcohol/week: 0.0 standard drinks of alcohol   Drug use: No   Sexual activity: Not on file  Other Topics Concern   Not on file  Social History Narrative   1 cup of Jasmine tea daily    Social Drivers of Health   Financial Resource Strain: Low Risk  (05/08/2023)   Received from [facility]   Overall Financial Resource Strain (CARDIA)    Difficulty of Paying Living Expenses: Not hard at all  Food Insecurity: No Food Insecurity (05/08/2023)   Received from [facility]   Hunger Vital Sign    Worried About Running Out of Food in the Last Year: Never true    Ran Out of Food in the Last Year: Never true  Transportation Needs: No Transportation Needs (05/08/2023)   Received from [facility] - Transportation    In the past 12 months, has lack of transportation kept you from medical appointments or from getting medications?: No    Lack of Transportation (Non-Medical): No  Physical Activity: Inactive (11/13/2019)   Received from [facility] visits prior to 08/22/2022., [facility] visits prior to 08/22/2022.   Exercise Vital Sign    Days of Exercise per Week: 0 days    Minutes of Exercise per Session: 0 min  Stress: No Stress Concern Present (11/13/2019)   Received from [facility] visits prior to 08/22/2022., [facility] visits prior to 08/22/2022.   [employer] of Occupational Health - Occupational Stress Questionnaire    Feeling of Stress : Not at all  Social Connections: Socially Integrated (11/13/2019)   Received  from [facility] visits prior to 08/22/2022., [facility] visits prior to 08/22/2022.   Social Connection and Isolation Panel [NHANES]    Frequency of Communication with Friends and Family: More than three times a week    Frequency of Social Gatherings with Friends and Family: More than three times a week    Attends Religious Services: 1 to 4 times per year    Active Member of [employer] or Organizations: Yes    Attends Banker Meetings: 1 to 4 times per year    Marital Status: Married   Past Surgical History:  Procedure Laterality Date   CHOLECYSTECTOMY  03/2010   COLONOSCOPY  2009   Dr. Jayne Mews    CYSTOSCOPY     Multiple Cystoscopies with stents or biopsy    ESOPHAGOGASTRODUODENOSCOPY  10/2013   EYE SURGERY     bilateral cataract removal   Gamma knife radiation  01/2018   for trigeminal neuralgia   REFRACTIVE SURGERY Bilateral 2022   RHIZOTOMY Left 04/28/2021   Procedure: Left Percutaneous trigeminal balloon rhizotomy;  Surgeon: Cannon Champion, MD;  Location: [facility] OR;  Service: Neurosurgery;  Laterality: Left;   TONSILLECTOMY     Past Surgical History:  Procedure Laterality Date   CHOLECYSTECTOMY  03/2010   COLONOSCOPY  2009   Dr. Jayne Mews    CYSTOSCOPY     Multiple Cystoscopies with stents or biopsy    ESOPHAGOGASTRODUODENOSCOPY  10/2013   EYE SURGERY      bilateral cataract removal   Gamma knife radiation  01/2018   for trigeminal neuralgia   REFRACTIVE SURGERY Bilateral 2022   RHIZOTOMY Left 04/28/2021   Procedure: Left Percutaneous trigeminal balloon rhizotomy;  Surgeon: Cannon Champion, MD;  Location: [facility] OR;  Service: Neurosurgery;  Laterality: Left;   TONSILLECTOMY     Past Medical History:  Diagnosis Date   Adenomatous colon polyp 03/24/2018   Allergy    Arthritis    Cataract    Chronic headaches    Depression    Fibromyalgia    Gallstones    GERD (gastroesophageal reflux disease)    Hyperlipemia    Hypertension    Interstitial cystitis    Lactose intolerance    LBBB (left bundle branch block)    Osteoarthritis    Osteopenia    Pneumonia    Thyroid  disease    Tinnitus    Trigeminal neuralgia    BP 127/76 (BP Location: Left Arm, Patient Position: Sitting, Cuff Size: Normal)   Pulse 74   Ht 5\' 3"  (1.6 m)   Wt 145 lb 3.2 oz (65.9 kg)   SpO2 97%   BMI 25.72 kg/m   Opioid Risk Score:   Fall Risk Score:  `1  Depression screen [facility] 2/9     11/19/2023   10:10 AM 10/05/2023    9:18 AM 05/25/2023   11:27 AM 02/23/2023   11:21 AM 12/28/2022    1:35 PM 10/27/2022    2:05 PM 09/15/2022    1:25 PM  Depression screen PHQ 2/9  Decreased Interest 0 0 0 0 0 0 0  Down, Depressed, Hopeless 0 0 0 0 0 0 0  PHQ - 2 Score 0 0 0 0 0 0 0  Altered sleeping 0        Tired, decreased energy 0        Feeling bad or failure about yourself  0        Trouble concentrating 0  Moving slowly or fidgety/restless 0        Suicidal thoughts 0        PHQ-9 Score 0          Review of Systems  Constitutional: Negative.   HENT: Negative.    Eyes:  Positive for visual disturbance.  Respiratory: Negative.    Cardiovascular: Negative.   Gastrointestinal: Negative.   Endocrine: Negative.   Genitourinary: Negative.   Musculoskeletal: Negative.        Pain in left side of face  Skin: Negative.   Allergic/Immunologic: Negative.    Neurological:  Positive for dizziness, tremors, light-headedness and numbness.       Tingling   Hematological: Negative.   Psychiatric/Behavioral: Negative.    All other systems reviewed and are negative.      Objective:   Physical Exam Gen: no distress, normal appearing HEENT: oral mucosa pink and moist, NCAT Cardio: Reg rate Chest: normal effort, normal rate of breathing Abd: soft, non-distended Ext: no edema Psych: pleasant, normal affect Skin: intact Neuro: Alert and oriented x3, left eye drop and facial droop- stable . Assessment & Plan:  Mrs. Maddux is an 83 year old woman who presents for f/u of left sided trigeminal neuralgia and dizziness.    1) Sensory deafferation pain, left sided trigeminal neuralgia vs cluster headaches (she does have lacrimation and runny nose on that side). -discussed that she has been happy with her new provider at [facility] -discussed that she is following up with [name] at [facility] -refilled fentanyl , discussed that she tried stopping this and pain was very severe -avoid proceed foods, eggs, gluten, dairy -recommended eating furits and vegetables -continue hydrocodone , discussed that she feel she needs either a higher dose or frequency, discussed that she has become somewhat more tolerant to the medication than she was 15 years ago -discussed that gabapentin  used to make her to lean to the left but now it makes her lean to the right -discussed that the gabapentin  makes her sleepy -discussed that she feels that the hydrocodone  is more effective for her than the gabapentin  -discussed [name] recommendation for an intrathecal pain pump, discussed that she has a trial scheduled for the pump, discussed that she is considering this at [facility] -encouraged discussing with with the [facility] team risks and benefits of this procedure -discussed that she is doing Zoom calls with a psychiatrist -d/c narcan  and naltrexone, discussed that she uses the  hydrocodone  sparingly.  -discussed that she had new programs installed on Monday -discussed that she is doing well with the hydrocodone  -discussed pruritus with the fentanyl  patch -continue following with pain psychologist, discussed that I think it is phenomenal that she is doing this -refilled hydrocodone  and fentanyl  -discussed rating her pain for the programming of electrical stimulation   -discussed mechanism of action of low dose naltrexone as an opioid receptor antagonist which stimulates your body's production of its own natural endogenous opioids, helping to decrease pain. Discussed that it can also decrease T cell response and thus be helpful in decreasing inflammation, and symptoms of brain fog, fatigue, anxiety, depression, and allergies. Discussed that this medication needs to be compounded at a compounding pharmacy and can more expensive. Discussed that I usually start at 1mg  and if this is not providing enough relief then I titrate upward on a monthly basis.    -discussed that savella  caused hallucinations -discussed that we tried lyrica  and we cannot recall but she had some side effect -discussed that topamax  did  not help and made her feel worse -discussed that carbamazpeine did not help -discussed that she is happy that she does not have to return to [place] as those trips caused her a lot of fatigue -discussed that pain was bad after she cooked for Thanksgiving -discussed her follow-up with [name] -discussed the negative experience when she last went to [place]  -discussed that next available appointment was on June 24th or 26th, but they were able to move this up to May 22nd or 24th -discussed that she keeps a journal with the setting with the level of her pain.  -continue Diclofenac 3%, Gabapentin  5%, Lidocaine  5%, Menthol 1% compounded at [facility]:  apply 1-2 pumps three to four times per day as needed, discussed that this dose help -discussed that she has had to  turn off the stimulator more since she has established care at [facility] -discussed response to her stimulator -discussed doubling the gabapentin  when pain is severe Increase low dose naltrexone to 3mg  HS -discussed her response to the stimulator She does have a history of migraines.  -Discussed that if she has to be in bed all day due to her pain it may be beneficial for her husband to give her at least one hydrocodone  per day to enable her to function -provided refill of Fentanyl  patch as she will be leaving for her surgery in [place] soon and her husband plans to stay there for some time in case she has any surgical complications.  -discussed her follow-up with pain psychology.  -recommended weaning off Fentanyl , discussed stopping fentanyl  because she is on 12.5mg  today but she had tried this a couple of weeks ago and pain was so severe that she needed patch to be replaced.  -discussed that she is taking hydrocodone  4 tablets per day, and recommend to decrease this to 3 tabs per day for three days and then to keep decreasing the tab by once per day.  -discussed that skipping the morning dose of hydrocodone  will at least allow her to sleep -recommended trying Frankincense and Myrrh essential oils mixed in coconut oil and applied to area of pain -recommended restarting Topamax  -called [facility] to prescribe compounding gel refill -continue Gabapentin  100mg  up to 8 times a day, discussed her current schedule of takin -discussed using Oragel multiple times per ay since this helps for an hour, use the topical cream intermittently in between -continue follow-up with [facility] to set up an appointment for her with them. Unfortunately Dr. Lorretta Rossetti has retired but was able to set her up with Dr. Abbe Abate who specializes in migraines and trigeminal neuralgia on July 25th. Let patient and husband know. They will mail her their address.   -commended her for calling Dr. Crecencio Dodge as well to let  him know how she is feeling -recommended taking 1 hydrocodone  now for the severe pain, and discussed that the Fentanyl  patch with take 12-24 hours to kick in -discussed with April the neurology referral and she did specify Dr. Moses Arenas and spoke with the [facility] department -discussed CBD oil, her son bought her some of this and she felt that it greatly helped  Recommend topical CBD oil- discussed its benefits in reducing inflammation, pain, insomnia, and anxiety, but she stopped because effect wore of.  -Discussed that CBD oil differs from marijuana in that it does not contain THC- the substance that causes euphoria.  -Discussed that it is made from the hemp plant.  -It has been used for thousands  of years -preliminary research suggests that if may be able to shrink cancerous tumors, stop plaque formation in Alzheimer's Disease, and slow the progress of brain disease from concussions.  -Additional benefits that have been demonstrated in studies include improved nausea, indigestion, and brain health, and reduced seizures.  -In a survey 92% of patients who tried medical cannabis felt it improved symptoms such as chronic pain, arthritis, migraines, and cancer.   -Provided with a pain relief journal and discussed that it contains foods and lifestyle tips to naturally help to improve pain. Discussed that these lifestyle strategies are also very good for health unlike some medications which can have negative side effects. Discussed that the act of keeping a journal can be therapeutic and helpful to realize patterns what helps to trigger and alleviate pain.   -discussed with Dr. Elza Handing trigeminal deafferation pain, discussed the difference between this and trigeminal neuralgia with patient and husband, discussed motor cortex stimulation. -discussed whether or not to continue fentanyl  patch, husband does feel that the IV fentanyl   -encouraged following with Dr. Sweet/Dr. Annabell Key at [facility] to try procedure recommended by D. Ostegard.  -discussed mechanism of action of low dose naltrexone as an opioid receptor antagonist which stimulates your body's production of its own natural endogenous opioids, helping to decrease pain. Discussed that it can also decrease T cell response and thus be helpful in decreasing inflammation, and symptoms of brain fog, fatigue, anxiety, depression, and allergies. Discussed that this medication needs to be compounded at a compounding pharmacy and can more expensive. Discussed that I usually start at 1mg  and if this is not providing enough relief then I titrate upward on a monthly basis.   -stop lamotrigine  as it is making her dizzy without benefit -reviewed patient's medical notes -discussed her progress with getting a referral to [facility] -recommended going to ED given darkening of skin, worsening eye closure, finding of cyst, dizziness to receive prompt neurosurgical eval regarding if surgery is necessary, and also for pain management given worsening pain refractory to all treatments and resulting suicidality. Reviewed ED doc notes- he discussed with on-call neurology and NSGY and determined that patient could be discharged with outpatient follow-up. IV fentanyl  was administered with relief to patient. She was discharged on fentanyl  patch which has not provided relief. She is getting 12mcg- no side effects- discussed increasing to 25mcg and her husband is in agreement. Prior auth completed and approved. Discussed with husband starting patch on Satruday after she has worn 12mcg patch for 72 hours -encouraged follow-up with Dr. Elza Handing, husband let me know appointment has been set up 1/18.  -recommended follow-up with Dr. 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  -Discussed that turmeric  -discussed steroid taper. -continue savella , husband feels like it is helping with her suicidal thoughts. Increase to 25mg .  Discontinued hydrocodone   -discuss mouth guard with dentist.  -d/c topamax  -she does have another procedure scheduled. -refilled Norco -follow-up monthly with Emilia Harbour and with me in February, placed on waitlist for earlier appointment with me, advised phone visits/MyChart are a great way to communicate in the interim -discussed that she can take an additional one or two tylenol  for breakthrough pain -recommended checking CMP for liver enzymes with  next labs -prescribed NAC 600mg  BID to protect the liver while taking tylenol , discussed its health benefits for mitochondrial function -d/c Baclofen   -f/u with Dr. Elza Handing and Dr. Crecencio Dodge 1/18 -discussed retrying carbamazepine  to see if it is effective for her. It is $89 for her, so we discussed trying Lamictal  instead.  -encouraged anti-inflammatory diet.  -she had sinusitis that was viral and was treated with antibiotics. This was in the early 90s. The symptoms resolved after 2 months. She uses to get sinus infections and saline rinse.  -She has had multiple root canals.  -Discontinue 100% oxygen via facemask as this was not helpful.  -Discussed that unfortunately Sprint PNS is not indicated for craniofacial pain. -Amitriptyline  and Lyrica  did not help.  -Continue Cymbalta  60mg .  -Topiramate  helped but caused dizziness.  -Provided with hand out with information regarding trigeminal neuralgia -Failed Penytoin.  -trial lidocaine  patch -She would like to try biofeedback. -reviewed neurology note.  -referred to Dr. Moses Arenas.  -will try trigger point injections next visit.  -Continue Ketamine 10%, Baclofen  2%, Cyclobenzaprine 2%, Ketoprofen 10%, Gabapentin  6%,  Bupivacaine 1%, Amitryptiline 5% to apply to painful areas- using 3-4 times per day.  -Can consider Meloxicam if Phenytoin  does not help.  -Discussed Qutenza  as an option for neuropathic pain control. Discussed that this is a capsaicin  patch, stronger than capsaicin  cream. Discussed that it is currently approved for diabetic peripheral neuropathy and post-herpetic neuralgia, but that it has also shown benefit in treating other forms of neuropathy. Provided patient with link to site to learn more about the patch: [email or web address] .[email or web address]/. Discussed that the patch would be placed in office and benefits usually last 3 months. Discussed that unintended exposure to capsaicin  can cause severe irritation of eyes, mucous membranes, respiratory tract, and skin, but that Qutenza  is a local treatment and does not have the systemic side effects of other nerve medications. Discussed that there may be pain, itching, erythema, and decreased sensory function associated with the application of Qutenza . Side effects usually subside within 1 week. A cold pack of analgesic medications can help with these side effects. Blood pressure can also be increased due to pain associated with administration of the patch.  -she cut down on her gabapentin  due to her dizziness    Turmeric to reduce inflammation--can be used in cooking or taken as a supplement.  Benefits of turmeric:  -Highly anti-inflammatory  -Increases antioxidants  -Improves memory, attention, brain disease  -Lowers risk of heart disease  -May help prevent cancer  -Decreases pain  -Alleviates depression  -Delays aging and decreases risk of chronic disease  -Consume with black pepper to increase absorption    Turmeric Milk Recipe:  1 cup milk  1 tsp turmeric  1 tsp cinnamon  1 tsp grated ginger (optional)  Black pepper (boosts the anti-inflammatory properties of turmeric).  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  -she is a good metabolizer of amitriptyline  and has tolerated nortrypilline in  the past, but without much benefit.    3) CKD: encouraged 6-8 glasses of water per day. She states that she had AKI with Ibuprofen in the past. Discussed checking Cr level next visit if we are considering Meloxicam -Discussed that Nucynta , and other pain medications, may stay in her bloodstream longer given her CKD   4) Hypernatremia: encouraged hydration   5) General health and pain: provided with list of healthy foods that are both nutritious and fight pain.   6) Depressed: Amitriptyline  did help with this but she could not tolerate the drowsiness. Encouraged focusing on the factors that she can control, such as anti-inflammatory diet.   7) Fibromyalgia: Discussed goal to wean steroids prior to surgery. Continue 1mg  daily. She is on that intermittently. She started this again about a month ago. She has been on this about a year. -continue gabapentin  -recommended eating a lot of fruits and vegetables -recommended avoiding eggs, gluten, dairy, processed foods Continue Savella  -discussed benefits of cold water showers.  -continue gabapentin .  Continue fentanyl  and hydrocodone  -avoid processed foods -avoid gluten, dairy, and eggs -eat fruits, vegetables, spices, and herbs -Discussed current symptoms of pain and history of pain.  -Discussed benefits of  exercise in reducing pain. -Discussed following foods that may reduce pain: 1) Ginger (especially studied for arthritis)- reduce leukotriene production to decrease inflammation 2) Blueberries- high in phytonutrients that decrease inflammation 3) Salmon- marine omega-3s reduce joint swelling and pain 4) Pumpkin seeds- reduce inflammation 5) dark chocolate- reduces inflammation 6) turmeric- reduces inflammation 7) tart cherries - reduce pain and stiffness 8) extra virgin olive oil - its compound olecanthal helps to block prostaglandins  9) chili peppers- can be eaten or applied topically via capsaicin  10) mint- helpful for headache, muscle aches, joint pain, and itching 11) garlic- reduces inflammation 12) Green tea- reduces inflammation and oxidative stress, helps with weight loss, may reduce the risk of cancer, recommend Double [employer] of Tea daily  Link to further information on diet for chronic pain: [email or web address]   8) Dizziness: -discussed that this could be from the combination of gabapentin  and baclofen .  -discussed that all the medications we are trying for pain may worsen the dizziness  9) Constipation:  -Provided list of following foods that help with constipation and highlighted a few: 1) prunes- contain high amounts of fiber.  2) apples- has a form of dietary fiber called pectin that accelerates stool movement and increases beneficial gut bacteria 3) pears- in addition to fiber, also high in fructose and sorbitol which have laxative effect 4) figs- contain an enzyme ficin which helps to speed colonic transit 5) kiwis- contain an enzyme actinidin that improves gut motility and reduces constipation 6) oranges- rich in pectin (like apples) 7) grapefruits- contain a flavanol naringenin which has a laxative effect 8) vegetables- rich in fiber and also great sources of folate, vitamin C, and  K 9) artichoke- high in inulin, prebiotic great for the microbiome 10) chicory- increases stool frequency and softness (can be added to coffee) 11) rhubarb- laxative effect 12) sweet potato- high fiber 13) beans, peas, and lentils- contain both soluble and insoluble fiber 14) chia seeds- improves intestinal health and gut flora 15) flaxseeds- laxative effect 16) whole grain rye bread- high in fiber 17) oat bran- high in soluble and insoluble fiber 18) kefir- softens stools -recommended to try at least one of these foods every day.  -drink 6-8 glasses of water per day -walk regularly, especially after meals.   10)  Osteopenia - was tapered off the prednisone  -discussed that currently her pain may be more of a risk to her than osteopenia given her suicidal ideation from her pain -discussed risk of fall with the medications that make her dizziness.   11) Daytime sleepiness -discussed that this is secondary to her medications.  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  -discussed red light therapy  15) HTN: -discussed clonidine  patch could potentially help with HTN and pain.  -BP is 170/90 today.  -Advised checking BP daily at home and logging results to bring into follow-up appointment with PCP and myself. -Reviewed BP meds today.  -Advised regarding healthy foods that can help lower blood pressure and provided with a list: 1) citrus foods- high in vitamins and minerals 2) salmon and other fatty fish - reduces inflammation and oxylipins 3) swiss chard (leafy green)- high level of nitrates 4) pumpkin seeds- one of the best natural sources of magnesium 5) Beans and lentils- high in fiber, magnesium, and potassium 6) Berries- high in flavonoids 7) Amaranth (whole grain, can be  cooked similarly to rice and oats)- high in magnesium and fiber 8) Pistachios- even more effective at reducing BP than other nuts 9) Carrots- high in phenolic compounds that relax blood vessels and reduce inflammation 10) Celery- contain phthalides that relax tissues of arterial walls 11) Tomatoes- can also improve cholesterol and reduce risk of heart disease 12) Broccoli- good source of magnesium, calcium , and potassium 13) Greek yogurt: high in potassium and calcium  14) Herbs and spices: Celery seed, cilantro, saffron, lemongrass, black cumin, ginseng, cinnamon, cardamom, sweet basil, and ginger 15) Chia and flax seeds- also help to lower cholesterol and blood sugar 16) Beets- high levels of nitrates that relax blood vessels  17) spinach and bananas- high in potassium  -Provided lise of supplements that can help with hypertension:  1) magnesium: one high quality brand is Bi[employer]em[employer] since it contains all 7 types of magnesium, otherwise over the counter magnesium gluconate 400mg  is a good option 2) B vitamins 3) vitamin D  4) potassium 5) CoQ10 6) L-arginine 7) Vitamin C 8) Beetroot -Educated that goal BP is 120/80. -Made goal to incorporate some of the above foods into diet.    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  -discussed that she had weaned off the steroids and had been doing fine off the steroids -discussed that aquatic therapy can help -discussed that she should encourage follow-up with rheumatology to assess for giant cell arteritis -[facility] -continue 2mg  steroids  16. Sciatica: -consider red light therapy -discussed that this has improved -discussed aquatherapy -discussed that the spinal injection helped temporarily -discussed that she has been taking 4mg  of low dose naltrexone -discussed that she used some hydrocodone  she had left over from the past.  -discussed lumbosacral orthosis, advised using  for activities that involve bending/twisting/walking on uneven terrain -discussed that it would probably be better to repeat steroid injection than to continue hydrocodone  -advised to use lidocaine  patch -continue capsaicin  cream  17. Family illness: -discussed that she lost 2 nephews recently  97. Depression:  -continue cymbalta  -encouraged follow-up with psychiatry   19. Dizziness: -discussed that this has improved  20. 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

## 2023-11-25 ENCOUNTER — Encounter (HOSPITAL_BASED_OUTPATIENT_CLINIC_OR_DEPARTMENT_OTHER): Payer: Self-pay | Admitting: Physical Therapy

## 2023-11-25 ENCOUNTER — Ambulatory Visit: Admitting: Orthopedic Surgery

## 2023-11-25 ENCOUNTER — Ambulatory Visit (HOSPITAL_BASED_OUTPATIENT_CLINIC_OR_DEPARTMENT_OTHER): Attending: Pulmonary Disease | Admitting: Physical Therapy

## 2023-11-25 DIAGNOSIS — M6281 Muscle weakness (generalized): Secondary | ICD-10-CM | POA: Insufficient documentation

## 2023-11-25 DIAGNOSIS — M25562 Pain in left knee: Secondary | ICD-10-CM | POA: Diagnosis not present

## 2023-11-25 DIAGNOSIS — R2681 Unsteadiness on feet: Secondary | ICD-10-CM | POA: Insufficient documentation

## 2023-11-25 DIAGNOSIS — R293 Abnormal posture: Secondary | ICD-10-CM | POA: Insufficient documentation

## 2023-11-25 DIAGNOSIS — G8929 Other chronic pain: Secondary | ICD-10-CM | POA: Insufficient documentation

## 2023-11-25 DIAGNOSIS — M25561 Pain in right knee: Secondary | ICD-10-CM | POA: Insufficient documentation

## 2023-11-25 DIAGNOSIS — M5459 Other low back pain: Secondary | ICD-10-CM | POA: Diagnosis present

## 2023-11-25 NOTE — Therapy (Signed)
 OUTPATIENT PHYSICAL THERAPY THORACOLUMBAR TREATMENT   Patient Name: [name] MRN: [id number] DOB:June 27, 1940, 83 y.o., female Today's Date: 11/25/2023  END OF SESSION:  PT End of Session - 11/25/23 1109     Visit Number 2    Date for PT Re-Evaluation 01/14/24    Authorization Type medicare    Progress Note Due on Visit 10    PT Start Time 1101    PT Stop Time 1139    PT Time Calculation (min) 38 min    Activity Tolerance Patient tolerated treatment well    Behavior During Therapy [facility] for tasks assessed/performed             Past Medical History:  Diagnosis Date   Adenomatous colon polyp 03/24/2018   Allergy    Arthritis    Cataract    Chronic headaches    Depression    Fibromyalgia    Gallstones    GERD (gastroesophageal reflux disease)    Hyperlipemia    Hypertension    Interstitial cystitis    Lactose intolerance    LBBB (left bundle branch block)    Osteoarthritis    Osteopenia    Pneumonia    Thyroid  disease    Tinnitus    Trigeminal neuralgia    Past Surgical History:  Procedure Laterality Date   CHOLECYSTECTOMY  03/2010   COLONOSCOPY  2009   Dr. Jayne Mews    CYSTOSCOPY     Multiple Cystoscopies with stents or biopsy    ESOPHAGOGASTRODUODENOSCOPY  10/2013   EYE SURGERY     bilateral cataract removal   Gamma knife radiation  01/2018   for trigeminal neuralgia   REFRACTIVE SURGERY Bilateral 2022   RHIZOTOMY Left 04/28/2021   Procedure: Left Percutaneous trigeminal balloon rhizotomy;  Surgeon: Cannon Champion, MD;  Location: [facility] OR;  Service: Neurosurgery;  Laterality: Left;   TONSILLECTOMY     Patient Active Problem List   Diagnosis Date Noted   Decreased diffusion capacity 08/21/2018   Trigeminal neuralgia    Lumbar back pain with radiculopathy affecting right lower extremity 01/08/2014   Dizzinesses 01/08/2014    PCP: Imelda Man MD  REFERRING PROVIDER: Quillian Brunt, MD   REFERRING DIAG: [phone] (ICD-10-CM) -  Muscular deconditioning   Rationale for Evaluation and Treatment: Rehabilitation  THERAPY DIAG:  Muscle weakness (generalized)  Other low back pain  Chronic pain of both knees  Abnormal posture  Unsteadiness on feet  ONSET DATE: exacerbation x 6 months  SUBJECTIVE:  SUBJECTIVE STATEMENT: Pt reports that she has not completed her aquatic HEP since her last visit (01/2023).    From initial evaluation: "My head started to hurt so bad after I was dc last time I did not follow up with completing my home programs.  I have gotten weak.  The trigeminal neuralgia is bad and not getting any better with the stimulator.  They want to do another surgery.  My councilor wanted me to come back here.  PERTINENT HISTORY:  Trigeminal neuralgia chronic with stimulator placed Fibromyalgia Polymyalgia rheumatica  PAIN:  Are you having pain? Yes Pain location:  general below neck:LB and knees current 0/10; worst 5/10  Trigeminal neuralgiaL: left sided face current 9/10  Pain description: ache, throb; stabbing Aggravating factors: Body: walking; standing Head sharp; running/trickling water sensation Relieving factors: stimulator; meds  PRECAUTIONS: Fall  RED FLAGS: Motor cortex stimulator   WEIGHT BEARING RESTRICTIONS: No  FALLS:  Has patient fallen in last 6 months? No  LIVING ENVIRONMENT: Lives with: lives with their family and lives with their spouse Lives in: House/apartment Stairs: Yes: Internal: 16 steps; on right going up Has following equipment at home: None   OCCUPATION: retired   PLOF: Independent  PATIENT GOALS: improve strength, return to exercise routine  NEXT MD VISIT: as needed  OBJECTIVE:  Note: Objective measures were completed at Evaluation unless otherwise  noted.  DIAGNOSTIC FINDINGS:  X-Ray 10/23 Moderate tricompartmental degenerative changes   PATIENT SURVEYS:  Modified Oswestry To be completed next session/ran out of time   COGNITION: Overall cognitive status: Within functional limits for tasks assessed                         SENSATION: WFL   EDEMA: none   POSTURE: forward head; left knee valgus deformity; decreased lumbar lordosis   PALPATION: No tenderness  LUMBAR ROM:   Wfl pain with return to upright from flexion  LOWER EXTREMITY ROM:     wfl  LOWER EXTREMITY MMT:     Completed in sitting HD lb Right eval Left eval  Hip flexion 33.8 28.4  Hip extension    Hip abduction 19.1 17.2  Hip adduction    Hip internal rotation    Hip external rotation    Knee flexion    Knee extension 41.4 40.5  Ankle dorsiflexion    Ankle plantarflexion    Ankle inversion    Ankle eversion     (Blank rows = not tested)   FUNCTIONAL TESTS:  Timed up and go (TUG): 14.59 BERG Balance Test           Sit to Stand 3  Standing unsupported 4  Sitting with back unsupported but feet supported 4  Stand to sit  3  Transfers  3  Standing unsupported with eyes closed 4  Standing unsupported feet together 4  From standing position, reach forward with outstretched arm 4  From standing position, pick up object from floor 4  From standing position, turn and look behind over each shoulder 4  Turn 360 4  Standing unsupported, alternately place foot on step 2  Standing unsupported, one foot in front 1  Standing on one leg 1  Total:  45     GAIT: Distance walked: 500 ft Assistive device utilized: None Level of assistance: Complete Independence Comments: decreased step length and cadence, knees knocking. Left hip rotation  TREATMENT  Pt seen for aquatic therapy today.  Treatment took place in water  3.5-4.75 ft in depth at the [facility] pool. Temp of water was 91.  Pt entered/exited the pool via stairs and step to  pattern with hand rail.   -walking unsupported forward/ backward - 3 laps - Hand float carry, walking forward/ backward with single/ bilat yellow hand floats - Side Stepping with arm addct/ abdct with yellow Hand Floats -3 laps - Braided Sidestepping with Arms Out , UE support on yellow hand floats - TrA set with rainbow hand float press to thighs, (single/ double)  - Standing 'L' Stretch at [employer]   - UE on wall: Heel Toe Raises ; Standing Hip Abduction  x 10 each    PATIENT EDUCATION:  Education details: reacquainting with aquatic therapy  Person educated: Patient Education method: Explanation Education comprehension: verbalized understanding  HOME EXERCISE PROGRAM: [id number] from last session  ASSESSMENT:  CLINICAL IMPRESSION: Pt is familiar to setting and staff from previous episode of care. She demonstrates safety and independence in aquatic setting with therapist instructing from deck.  Pt is lead through her previous episode's aquatic HEP to re-familiarize. She reported no increase in pain during session; provided good distraction from pain per pt report. Goals are ongoing.   Therapist to complete ODI next session, if time allows.     From initial evaluation:  Patient is a 83 y.o. f who was seen today for physical therapy evaluation and treatment for muscular deconditioning.  Pt with multiple co morbidities including fibromyalgia, Polymyalgia rheumatica as well a trigeminal neuralgia.  The later has been the main limiting issue with follow through of prior HEP as instructed about 1 year ago.  She reports no falls just significant pain which has limited her function.  She presents with decreased strength, balance and general pain from myalgias .  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  She will benefit from skilled physical therapy intervention both aquatic and then progression to land when able.  She does report having lists of  HEP's so land based will focus on re-teaching program to ensure proper completion. Aquatics to work on strengthening, balance and pain management.  OBJECTIVE IMPAIRMENTS: decreased balance and decreased strength.    ACTIVITY LIMITATIONS: locomotion level   PARTICIPATION LIMITATIONS: community activity and playing tennis   PERSONAL FACTORS: Age and 1-2 comorbidities: OA and trigeminal neuralgia are also affecting patient's functional outcome.    REHAB POTENTIAL: Good   CLINICAL DECISION MAKING: Evolving/moderate complexity   EVALUATION COMPLEXITY: Moderate   GOALS: Goals reviewed with patient? Yes  SHORT TERM GOALS: Target date: 12/10/23  Pt will tolerate full aquatic sessions consistently without increase in pain and with improving function to demonstrate good toleration and effectiveness of intervention.  Baseline: Goal status: INITIAL  2.  Pt will complete 10 minutes of continuous amb submerged to demonstrate improvement on endurance Baseline:  Goal status: INITIAL  3.  Pt to report 0/10 pain in knees and back with exercising submerged Baseline:  Goal status: INITIAL   LONG TERM GOALS: Target date: 01/14/24  Pt to improve on ODI by  13% to demonstrate statistically significant Improvement in function. Baseline:  Goal status: INITIAL  2.  Pt will be indep with final HEP's (land and aquatic as appropriate) for continued management of condition Baseline:  Goal status: INITIAL  3.  Pt will report decrease in general pain (not trigeminal neuralgia) by at least 50% for improved toleration to activity/quality of life and to demonstrate improved management of pain.  Baseline:  Goal status: INITIAL  4.  Pt will improve strength in hips by  5lbs to demonstrate improved overall physical function Baseline:  Goal status: INITIAL  5.  Pt will perform SLS in 3.6 ft (ue support of hand buoys as needed) x 15s or> improvement in balance ability Baseline:  Goal status:  INITIAL  6.  Pt will improve on Tug test to <or= 12s  to demonstrate improvement in lower extremity function, mobility and decreased fall risk. Baseline: 14.59 Goal status: INITIAL  PLAN:  PT FREQUENCY: 1-2x/week  PT DURATION: 8 weeks  PLANNED INTERVENTIONS: 97164- PT Re-evaluation, 97110-Therapeutic exercises, 97530- Therapeutic activity, [id number]- Neuromuscular re-education, 97535- Self Care, 78295- Manual therapy, [id number]- Gait training, [id number]- Aquatic Therapy, Patient/Family education, Balance training, Stair training, Taping, Dry Needling, DME instructions, Cryotherapy, and Moist heat.  PLAN FOR NEXT SESSION: land based will focus on re-teaching program to ensure proper completion. Aquatics to work on strengthening, balance and pain management.  Almedia Jacobsen, PTA 11/25/23 11:43 AM [facility] [address] [place], [place], [address] Phone: [phone]   Fax:  [phone]

## 2023-11-27 ENCOUNTER — Other Ambulatory Visit: Payer: Self-pay

## 2023-11-27 ENCOUNTER — Emergency Department (HOSPITAL_COMMUNITY)
Admission: EM | Admit: 2023-11-27 | Discharge: 2023-11-27 | Disposition: A | Discharge status: Home / Self Care | Attending: Emergency Medicine | Admitting: Emergency Medicine

## 2023-11-27 ENCOUNTER — Emergency Department (HOSPITAL_COMMUNITY)

## 2023-11-27 ENCOUNTER — Encounter (HOSPITAL_COMMUNITY): Payer: Self-pay

## 2023-11-27 DIAGNOSIS — I10 Essential (primary) hypertension: Secondary | ICD-10-CM | POA: Insufficient documentation

## 2023-11-27 DIAGNOSIS — R0989 Other specified symptoms and signs involving the circulatory and respiratory systems: Secondary | ICD-10-CM | POA: Diagnosis not present

## 2023-11-27 DIAGNOSIS — B349 Viral infection, unspecified: Secondary | ICD-10-CM

## 2023-11-27 DIAGNOSIS — Z79899 Other long term (current) drug therapy: Secondary | ICD-10-CM | POA: Diagnosis not present

## 2023-11-27 DIAGNOSIS — R6883 Chills (without fever): Secondary | ICD-10-CM | POA: Insufficient documentation

## 2023-11-27 DIAGNOSIS — I700 Atherosclerosis of aorta: Secondary | ICD-10-CM | POA: Diagnosis not present

## 2023-11-27 DIAGNOSIS — R079 Chest pain, unspecified: Secondary | ICD-10-CM | POA: Diagnosis not present

## 2023-11-27 DIAGNOSIS — J029 Acute pharyngitis, unspecified: Secondary | ICD-10-CM | POA: Insufficient documentation

## 2023-11-27 DIAGNOSIS — M791 Myalgia, unspecified site: Secondary | ICD-10-CM | POA: Diagnosis present

## 2023-11-27 DIAGNOSIS — R6889 Other general symptoms and signs: Secondary | ICD-10-CM

## 2023-11-27 DIAGNOSIS — R110 Nausea: Secondary | ICD-10-CM | POA: Diagnosis not present

## 2023-11-27 LAB — CBC WITH DIFFERENTIAL/PLATELET
Abs Immature Granulocytes: 0.04 10*3/uL (ref 0.00–0.07)
Basophils Absolute: 0.1 10*3/uL (ref 0.0–0.1)
Basophils Relative: 1 %
Eosinophils Absolute: 0.2 10*3/uL (ref 0.0–0.5)
Eosinophils Relative: 2 %
HCT: 44.5 % (ref 36.0–46.0)
Hemoglobin: 14.1 g/dL (ref 12.0–15.0)
Immature Granulocytes: 0 %
Lymphocytes Relative: 29 %
Lymphs Abs: 2.6 10*3/uL (ref 0.7–4.0)
MCH: 29.7 pg (ref 26.0–34.0)
MCHC: 31.7 g/dL (ref 30.0–36.0)
MCV: 93.9 fL (ref 80.0–100.0)
Monocytes Absolute: 0.9 10*3/uL (ref 0.1–1.0)
Monocytes Relative: 11 %
Neutro Abs: 5.2 10*3/uL (ref 1.7–7.7)
Neutrophils Relative %: 57 %
Platelets: 220 10*3/uL (ref 150–400)
RBC: 4.74 MIL/uL (ref 3.87–5.11)
RDW: 14.6 % (ref 11.5–15.5)
WBC: 9 10*3/uL (ref 4.0–10.5)
nRBC: 0 % (ref 0.0–0.2)

## 2023-11-27 LAB — URINALYSIS, W/ REFLEX TO CULTURE (INFECTION SUSPECTED)
Bilirubin Urine: NEGATIVE
Glucose, UA: NEGATIVE mg/dL
Hgb urine dipstick: NEGATIVE
Ketones, ur: NEGATIVE mg/dL
Leukocytes,Ua: NEGATIVE
Nitrite: NEGATIVE
Protein, ur: NEGATIVE mg/dL
Specific Gravity, Urine: 1.004 — ABNORMAL LOW (ref 1.005–1.030)
pH: 7 (ref 5.0–8.0)

## 2023-11-27 LAB — COMPREHENSIVE METABOLIC PANEL WITH GFR
ALT: 13 U/L (ref 0–44)
AST: 22 U/L (ref 15–41)
Albumin: 4.2 g/dL (ref 3.5–5.0)
Alkaline Phosphatase: 32 U/L — ABNORMAL LOW (ref 38–126)
Anion gap: 11 (ref 5–15)
BUN: 19 mg/dL (ref 8–23)
CO2: 27 mmol/L (ref 22–32)
Calcium: 9.6 mg/dL (ref 8.9–10.3)
Chloride: 106 mmol/L (ref 98–111)
Creatinine, Ser: 1.07 mg/dL — ABNORMAL HIGH (ref 0.44–1.00)
GFR, Estimated: 51 mL/min — ABNORMAL LOW (ref >60)
Glucose, Bld: 119 mg/dL — ABNORMAL HIGH (ref 70–99)
Potassium: 4 mmol/L (ref 3.5–5.1)
Sodium: 144 mmol/L (ref 135–145)
Total Bilirubin: 0.6 mg/dL (ref 0.0–1.2)
Total Protein: 7.1 g/dL (ref 6.5–8.1)

## 2023-11-27 LAB — RESP PANEL BY RT-PCR (RSV, FLU A&B, COVID)  RVPGX2
Influenza A by PCR: NEGATIVE
Influenza B by PCR: NEGATIVE
Resp Syncytial Virus by PCR: NEGATIVE
SARS Coronavirus 2 by RT PCR: NEGATIVE

## 2023-11-27 LAB — TROPONIN I (HIGH SENSITIVITY)
Troponin I (High Sensitivity): 16 ng/L (ref <18)
Troponin I (High Sensitivity): 18 ng/L — ABNORMAL HIGH (ref <18)
Troponin I (High Sensitivity): 19 ng/L — ABNORMAL HIGH (ref <18)

## 2023-11-27 LAB — I-STAT CG4 LACTIC ACID, ED
Lactic Acid, Venous: 1.8 mmol/L (ref 0.5–1.9)
Lactic Acid, Venous: 1.5 mmol/L (ref 0.5–1.9)

## 2023-11-27 LAB — GROUP A STREP BY PCR: Group A Strep by PCR: NOT DETECTED

## 2023-11-27 MED ORDER — ACETAMINOPHEN 500 MG PO TABS
1000.0000 mg | ORAL_TABLET | Freq: Once | ORAL | Status: AC
Start: 2023-11-27 — End: 2023-11-27
  Administered 2023-11-27: 1000 mg via ORAL
  Filled 2023-11-27: qty 2

## 2023-11-27 MED ORDER — SODIUM CHLORIDE 0.9 % IV BOLUS
1000.0000 mL | Freq: Once | INTRAVENOUS | Status: AC
  Administered 2023-11-27: 1000 mL via INTRAVENOUS

## 2023-11-27 NOTE — ED Provider Notes (Signed)
 [place] [facility] Provider Note   CSN: [id number] Arrival date & time: 11/27/23  0500     History  Chief Complaint  Patient presents with   Generalized Body Aches    [name] is a 83 y.o. female.  The history is provided by the patient and medical records. No language interpreter was used.     83 year old female history of hypertension, hyperlipidemia, trigeminal neuralgia, chronic headache, fibromyalgia, presenting with complaints of bodyaches.  Patient This morning with sensation of achiness and burning pain across her chest to her arms follows with feeling flushed and then having chills.  Symptom has been ongoing throughout the day today but it is seems to be improving.  She notes a mild sore throat and having some increased bowel movement but denies any fever, headache, runny nose, sneezing, coughing, shortness of breath, abdominal pain or dysuria.  She did endorse some mild nausea but no lightheadedness or dizziness.  No treatment tried.  No recent sick contact.  No change in medication.  Home Medications Prior to Admission medications   Medication Sig Start Date End Date Taking? Authorizing Provider  acetaminophen  (TYLENOL ) 500 MG tablet Take by mouth. 11/15/19   [provider]  Acetylcysteine (NAC) 600 MG CAPS Take 1 capsule (600 mg total) by mouth 2 (two) times daily. 05/20/21   Raulkar, Krutika P, MD  ACIDOPHILUS LACTOBACILLUS PO Take 1 tablet by mouth daily. 08/03/16   [provider]  amLODipine (NORVASC) 2.5 MG tablet Take 2.5 mg by mouth daily.    [provider]  amLODipine (NORVASC) 5 MG tablet Take 1 tablet by mouth once daily Orally Once a day for 90 days 09/16/21   [provider]  benzocaine  (ORAJEL) 10 % mucosal gel Use as directed 1 application in the mouth or throat as needed for mouth pain. 07/08/21   Raulkar, Keven Pel, MD  calcium  carbonate (SUPER CALCIUM ) 1500 (600 Ca) MG TABS  tablet 1 tablet with meals Orally Once a day    [provider]  cetirizine (ZYRTEC) 10 MG chewable tablet Chew by mouth.    [provider]  Cholecalciferol (VITAMIN D -3) 25 MCG (1000 UT) CAPS Take 2,000 Units by mouth daily.    [provider]  Cholecalciferol 125 MCG (5000 UT) capsule Take by mouth. 08/03/16   [provider]  Cholecalciferol 50 MCG (2000 UT) CAPS Take by mouth.    [provider]  cyanocobalamin (VITAMIN B12) 1000 MCG tablet Take by mouth.    [provider]  denosumab  (PROLIA ) 60 MG/ML SOSY injection as directed Every 6 months 02/25/21   [provider]  DULoxetine  (CYMBALTA ) 30 MG capsule Take 3 capsules (90 mg total) by mouth daily. 02/23/23   Raulkar, Keven Pel, MD  estradiol (ESTRACE) 0.1 MG/GM vaginal cream Place vaginally. 11/07/19   [provider]  fentaNYL  (DURAGESIC ) 25 MCG/HR Place 1 patch onto the skin every 3 (three) days. 11/12/23 11/11/24  Liam Redhead, MD  fluticasone  (FLONASE  ALLERGY RELIEF) 50 MCG/ACT nasal spray 1 spray in each nostril Nasally Once a day    [provider]  gabapentin  (NEURONTIN ) 100 MG capsule Take 100 mg by mouth 4 (four) times daily.    [provider]  HYDROcodone -acetaminophen  (NORCO/VICODIN) 5-325 MG tablet Take 1 tablet by mouth 3 (three) times daily as needed for moderate pain (pain score 4-6). 11/19/23   Raulkar, Keven Pel, MD  levothyroxine  (SYNTHROID ) 75 MCG tablet Take 75 mcg  by mouth daily before breakfast.    [provider]  magnesium gluconate (MAGONATE) 500 MG tablet Take 500 mg by mouth 2 (two) times daily.    [provider]  Methylcobalamin 1000 MCG SUBL     [provider]  naloxone  (NARCAN ) nasal spray 4 mg/0.1 mL Place 1 spray into the nose once.    [provider]  NONFORMULARY OR COMPOUNDED ITEM Apply 1 Pump topically 4 (four) times daily as needed. Ketamine 10%, Baclofen  2%, Cyclobenzaprine 2%,  Ketoprofen 10%, Gabapentin  6%, Bupivacaine 1%, Amitryptiline 5% Dispense 100mg  04/15/20   Raulkar, Krutika P, MD  ondansetron  (ZOFRAN ) 4 MG tablet 1 tablet as needed Orally Twice a day for 30 days    [provider]  pantoprazole  (PROTONIX ) 40 MG tablet Take 40 mg by mouth daily.    [provider]  Polyethyl Glycol-Propyl Glycol (SYSTANE FREE OP) Apply 1 drop to eye as needed.    [provider]  predniSONE  (DELTASONE ) 1 MG tablet Take 2 tablets (2 mg total) by mouth daily with breakfast. 12/15/22   Frazier Jacob, DO  Propylene Glycol (SYSTANE COMPLETE PF OP) Apply to eye.    [provider]  Pyridoxine  HCl (VITAMIN B-6 PO) Vitamin B6    [provider]  RA GLUCOSAMINE SULFATE 500 MG TABS  12/02/21   [provider]  rosuvastatin (CRESTOR) 5 MG tablet Take 5 mg by mouth daily.    [provider]  Vitamin D , Ergocalciferol , (DRISDOL ) 1.25 MG (50000 UNIT) CAPS capsule Take 1 capsule (50,000 Units total) by mouth every 7 (seven) days. 07/21/22   Raulkar, Keven Pel, MD      Allergies    Dipyridamole, Ciprofloxacin, Diclofenac, Diclofenac sodium, Hydrochlorothiazide , Other, Oxycodone hcl, Pantoprazole , Pantoprazole  sodium, Triamterene, Triamterene-hctz, Buprenorphine, Codeine, Diclofenac sodium, Doxycycline, Esomeprazole, Hydrochlorothiazide -triamterene, Irbesartan , Lactose, Lactose intolerance (gi), Lactulose, Losartan, Naproxen, Nortriptyline, Nortriptyline hcl, Oxycodone, Oxycontin [oxycodone hcl], Pennsaid [diclofenac sodium], Telmisartan, and Tramadol    Review of Systems   Review of Systems  All other systems reviewed and are negative.   Physical Exam Updated Vital Signs BP (!) 143/84   Pulse 78   Temp 98.2 F (36.8 C)   Resp 18   Ht 5\' 3"  (1.6 m)   Wt 65.9 kg   SpO2 99%   BMI 25.72 kg/m  Physical Exam Vitals and nursing note reviewed.  Constitutional:      General: She is not in acute distress.    Appearance: She  is well-developed.  HENT:     Head: Atraumatic.     Mouth/Throat:     Mouth: Mucous membranes are moist.     Comments: Throat exam unremarkable Eyes:     Conjunctiva/sclera: Conjunctivae normal.  Cardiovascular:     Rate and Rhythm: Normal rate and regular rhythm.     Pulses: Normal pulses.     Heart sounds: Normal heart sounds.  Pulmonary:     Effort: Pulmonary effort is normal.  Abdominal:     Palpations: Abdomen is soft.     Tenderness: There is no abdominal tenderness.  Musculoskeletal:     Cervical back: Neck supple.     Comments: 5/5 strength to all 4 extremities  Skin:    Findings: No rash.  Neurological:     Mental Status: She is alert. Mental status is at baseline.  Psychiatric:        Mood and Affect: Mood normal.     ED Results / Procedures / Treatments   Labs (all  labs ordered are listed, but only abnormal results are displayed) Labs Reviewed  COMPREHENSIVE METABOLIC PANEL WITH GFR - Abnormal; Notable for the following components:      Result Value   Glucose, Bld 119 (*)    Creatinine, Ser 1.07 (*)    Alkaline Phosphatase 32 (*)    GFR, Estimated 51 (*)    All other components within normal limits  URINALYSIS, W/ REFLEX TO CULTURE (INFECTION SUSPECTED) - Abnormal; Notable for the following components:   Color, Urine COLORLESS (*)    Specific Gravity, Urine 1.004 (*)    Bacteria, UA RARE (*)    All other components within normal limits  GROUP A STREP BY PCR  RESP PANEL BY RT-PCR (RSV, FLU A&B, COVID)  [id number]  CBC WITH DIFFERENTIAL/PLATELET  I-STAT CG4 LACTIC ACID, ED  I-STAT CG4 LACTIC ACID, ED  TROPONIN I (HIGH SENSITIVITY)  TROPONIN I (HIGH SENSITIVITY)    EKG None ED ECG REPORT   Date: 11/27/2023  Rate: 75  Rhythm: normal sinus rhythm  QRS Axis: normal  Intervals: normal  ST/T Wave abnormalities: nonspecific ST/T changes  Conduction Disutrbances:none  Narrative Interpretation:   Old EKG Reviewed: unchanged  I have personally reviewed  the EKG tracing and agree with the computerized printout as noted.   Radiology DG Chest 2 View Result Date: 11/27/2023 CLINICAL DATA:  Pain EXAM: CHEST - 2 VIEW COMPARISON:  07/15/2018 FINDINGS: Battery pack for deep brain stimulator is noted in the projection of the right hilum extending into the right-side of neck. Heart size is normal. Aortic atherosclerosis. Lung volumes are low. Pulmonary vascular congestion. No airspace opacities identified. Visualized osseous structures appear grossly intact. IMPRESSION: Low lung volumes and pulmonary vascular congestion. Electronically Signed   By: Kimberley Penman M.D.   On: 11/27/2023 06:18    Procedures Procedures    Medications Ordered in ED Medications  acetaminophen  (TYLENOL ) tablet 1,000 mg (has no administration in time range)  sodium chloride  0.9 % bolus 1,000 mL (has no administration in time range)    ED Course/ Medical Decision Making/ A&P Clinical Course as of 11/27/23 1519  Sat Nov 27, 2023  1512 Woke up hot shaky cold tremulous -- mild sore throat. Some loose bms today. Throat looked pretty benign. One more troponin and reassess. Reassess and repeat troponin.  [CP]    Clinical Course User Index [CP] Nelly Banco, PA-C                                 Medical Decision Making Amount and/or Complexity of Data Reviewed Labs: ordered. Radiology: ordered.  Risk OTC drugs.   BP (!) 166/68   Pulse 66   Temp 97.6 F (36.4 C)   Resp 18   Ht 5\' 3"  (1.6 m)   Wt 65.9 kg   SpO2 98%   BMI 25.72 kg/m   77:54 PM  83 year old female history of hypertension, hyperlipidemia, trigeminal neuralgia, chronic headache, fibromyalgia, presenting with complaints of bodyaches.  Patient This morning with sensation of achiness and burning pain across her chest to her arms follows with feeling flushed and then having chills.  Symptom has been ongoing throughout the day today but it is seems to be improving.  She notes a mild sore throat  and having some increased bowel movement but denies any fever, headache, runny nose, sneezing, coughing, shortness of breath, abdominal pain or dysuria.  She did endorse some mild nausea but  no lightheadedness or dizziness.  No treatment tried.  No recent sick contact.  No change in medication.  On exam patient is laying in bed with rigors, wearing for different blankets.  Throat exam unremarkable abdomen is soft nontender heart normal rate and rhythm, lungs are clear strength equal throughout  -Labs ordered, independently viewed and interpreted by me.  Labs remarkable for initial troponin of 16, repeat troponin is 18, will obtain a third troponin and if it is flat hide is less likely to be acute cardiac event.  Patient has a heart score of 3. -The patient was maintained on a cardiac monitor.  I personally viewed and interpreted the cardiac monitored which showed an underlying rhythm of: Normal sinus rhythm -Imaging independently viewed and interpreted by me and I agree with radiologist's interpretation.  Result remarkable for chest x-ray shows pulmonary vascular congestion -This patient presents to the ED for concern of rigors, this involves an extensive number of treatment options, and is a complaint that carries with it a high risk of complications and morbidity.  The differential diagnosis includes viral illness, bacterial illness, strep, pneumonia, ACS, fibromyalgia -Co morbidities that complicate the patient evaluation includes fibromyalgia, HTN, HLD -Treatment includes tylenol , IVF -Reevaluation of the patient after these medicines showed that the patient improved -PCP office notes or outside notes reviewed -Discussion with oncoming team who will f/u on 3rd trop and reassess -Escalation to admission/observation considered: dispo pending.          Final Clinical Impression(s) / ED Diagnoses Final diagnoses:  Rigors    Rx / DC Orders ED Discharge Orders     None         Debbra Fairy, PA-C 11/27/23 1522    Tegeler, Marine Sia, MD 11/27/23 [phone]

## 2023-11-27 NOTE — ED Triage Notes (Signed)
 Pt arrived from home via POV c/o burning all over, Pt is a and o x 4. Pt states that it just started at 0400. Pt is chilling here in triage.

## 2023-11-27 NOTE — ED Notes (Signed)
Discharge instructions reviewed with patient. Patient questions answered and opportunity for education reviewed. Patient voices understanding of discharge instructions with no further questions. Patient to lobby via wheelchair. 

## 2023-11-27 NOTE — ED Provider Triage Note (Signed)
 Emergency Medicine Provider Triage Evaluation Note  [name] , a 83 y.o. female  was evaluated in triage.  Pt complains of feeling chills that started this morning. Has been drinking and eating okay. Mild nausea. No vomiting nor diarrhea  Had to get up to go to bathroom 3-4x last night when normally goes 2x a night. No acute back pain, abd pain, burning with urination  CP with breathing. Also complains of chest feeling "hot". No cough or congestion. Mild fatigue  Review of Systems  Positive: See hpi Negative:   Physical Exam  BP (!) 143/84   Pulse 78   Temp 98.2 F (36.8 C)   Resp 18   Ht 5\' 3"  (1.6 m)   Wt 65.9 kg   SpO2 99%   BMI 25.72 kg/m  Gen:   Awake, no distress   Resp:  Normal effort  MSK:   Moves extremities without difficulty  Other:    Medical Decision Making  Medically screening exam initiated at 11:46 AM.  Appropriate orders placed.  [name] was informed that the remainder of the evaluation will be completed by another provider, this initial triage assessment does not replace that evaluation, and the importance of remaining in the ED until their evaluation is complete.  So far all labs look reassuring. No leukocytosis. No signs of infection nor hgb on UA. Resp panel negative. CXR doesn't show obvious PNA. Will add a troponin as she complains of CP and vague symptoms in female   Royann Cords, [place] 11/27/23 1152

## 2023-11-27 NOTE — Discharge Instructions (Addendum)
 Please use Tylenol  or ibuprofen for pain.  You may use 600 mg ibuprofen every 6 hours or 1000 mg of Tylenol  every 6 hours.  You may choose to alternate between the 2.  This would be most effective.  Not to exceed 4 g of Tylenol  within 24 hours.  Not to exceed 3200 mg ibuprofen 24 hours.  I would get plenty of rest, drink plenty of fluids, follow-up closely with your primary care doctor next week to ensure that your symptoms are improving, if your symptoms significantly worsen, you develop fever that does not respond to Motrin, Tylenol , develop worsening chest pain, new shortness of breath please return for further evaluation.

## 2023-11-27 NOTE — ED Provider Notes (Signed)
 Accepted handoff at shift change from [facility]. Please see prior provider note for more detail.   Briefly: Patient is 83 y.o.   DDX: concern for Woke up hot shaky cold tremulous -- mild sore throat. Some loose bms today. Throat looked pretty benign. One more troponin and reassess. Reassess and repeat troponin.   Plan: Delta troponin shows overall flat at 19, after discussion with the patient, I think stable for discharge, suspect early viral syndrome  After discussion with patient I feel safe for close outpatient follow-up, return precautions given, no acute abnormality noted in the emergency department, suspect early viral illness.   Nelly Banco, PA-C 11/27/23 1716    Kingsley, Victoria K, DO 11/27/23 2300

## 2023-11-29 DIAGNOSIS — Z09 Encounter for follow-up examination after completed treatment for conditions other than malignant neoplasm: Secondary | ICD-10-CM | POA: Diagnosis not present

## 2023-11-29 DIAGNOSIS — E860 Dehydration: Secondary | ICD-10-CM | POA: Diagnosis not present

## 2023-11-29 DIAGNOSIS — R509 Fever, unspecified: Secondary | ICD-10-CM | POA: Diagnosis not present

## 2023-11-29 DIAGNOSIS — R6889 Other general symptoms and signs: Secondary | ICD-10-CM | POA: Diagnosis not present

## 2023-11-29 DIAGNOSIS — R197 Diarrhea, unspecified: Secondary | ICD-10-CM | POA: Diagnosis not present

## 2023-12-06 ENCOUNTER — Ambulatory Visit (HOSPITAL_BASED_OUTPATIENT_CLINIC_OR_DEPARTMENT_OTHER): Admitting: Physical Therapy

## 2023-12-06 DIAGNOSIS — H353112 Nonexudative age-related macular degeneration, right eye, intermediate dry stage: Secondary | ICD-10-CM | POA: Diagnosis not present

## 2023-12-06 DIAGNOSIS — H43813 Vitreous degeneration, bilateral: Secondary | ICD-10-CM | POA: Diagnosis not present

## 2023-12-06 DIAGNOSIS — H35033 Hypertensive retinopathy, bilateral: Secondary | ICD-10-CM | POA: Diagnosis not present

## 2023-12-06 DIAGNOSIS — H35372 Puckering of macula, left eye: Secondary | ICD-10-CM | POA: Diagnosis not present

## 2023-12-06 DIAGNOSIS — H353123 Nonexudative age-related macular degeneration, left eye, advanced atrophic without subfoveal involvement: Secondary | ICD-10-CM | POA: Diagnosis not present

## 2023-12-06 DIAGNOSIS — Z961 Presence of intraocular lens: Secondary | ICD-10-CM | POA: Diagnosis not present

## 2023-12-07 DIAGNOSIS — N952 Postmenopausal atrophic vaginitis: Secondary | ICD-10-CM | POA: Diagnosis not present

## 2023-12-07 DIAGNOSIS — Z01419 Encounter for gynecological examination (general) (routine) without abnormal findings: Secondary | ICD-10-CM | POA: Diagnosis not present

## 2023-12-07 DIAGNOSIS — Z6825 Body mass index (BMI) 25.0-25.9, adult: Secondary | ICD-10-CM | POA: Diagnosis not present

## 2023-12-07 DIAGNOSIS — Z1231 Encounter for screening mammogram for malignant neoplasm of breast: Secondary | ICD-10-CM | POA: Diagnosis not present

## 2023-12-13 ENCOUNTER — Ambulatory Visit (INDEPENDENT_AMBULATORY_CARE_PROVIDER_SITE_OTHER): Admitting: Family Medicine

## 2023-12-13 VITALS — BP 122/72 | Ht 63.0 in | Wt 148.0 lb

## 2023-12-13 DIAGNOSIS — M7632 Iliotibial band syndrome, left leg: Secondary | ICD-10-CM | POA: Diagnosis not present

## 2023-12-13 NOTE — Progress Notes (Unsigned)
 PCP: Kristina Nottingham, MD  Subjective:   HPI: Patient is a 83 y.o. female here for left knee pain follow-up.  [name] was last seen on 11/11/2023, at which time she received a steroid injection and list of exercises for left-sided IT band syndrome. Since then she states her knee pain has improved. She states the steroid injection gave her 100% relief for 1 week at which time her symptoms returned. She has been keeping up with her exercises as best she can, however due to other health concerns she has been unable to participate in physical therapy. She has plans to resume aquatherapy later this week. She does occasionally have pain in her lateral knee that radiates down into the top of her foot, however she currently does not have this pain. She wears both a knee strap and knee brace on the left side. While she does not take any medications directly for her knee pain, she is on prednisone , norco, and gabapentin  for trigeminal neuralgia. She has not had any new weakness, trauma, or catching in the joint.  Past Medical History:  Diagnosis Date   Adenomatous colon polyp 03/24/2018   Allergy    Arthritis    Cataract    Chronic headaches    Depression    Fibromyalgia    Gallstones    GERD (gastroesophageal reflux disease)    Hyperlipemia    Hypertension    Interstitial cystitis    Lactose intolerance    LBBB (left bundle branch block)    Osteoarthritis    Osteopenia    Pneumonia    Thyroid  disease    Tinnitus    Trigeminal neuralgia     Current Outpatient Medications on File Prior to Visit  Medication Sig Dispense Refill   acetaminophen  (TYLENOL ) 500 MG tablet Take by mouth.     Acetylcysteine (NAC) 600 MG CAPS Take 1 capsule (600 mg total) by mouth 2 (two) times daily. 60 capsule 0   ACIDOPHILUS LACTOBACILLUS PO Take 1 tablet by mouth daily.     amLODipine (NORVASC) 2.5 MG tablet Take 2.5 mg by mouth daily.     amLODipine (NORVASC) 5 MG tablet Take 1 tablet by mouth once daily Orally  Once a day for 90 days     benzocaine  (ORAJEL) 10 % mucosal gel Use as directed 1 application in the mouth or throat as needed for mouth pain. 5.3 g 0   calcium  carbonate (SUPER CALCIUM ) 1500 (600 Ca) MG TABS tablet 1 tablet with meals Orally Once a day     cetirizine (ZYRTEC) 10 MG chewable tablet Chew by mouth.     Cholecalciferol (VITAMIN D -3) 25 MCG (1000 UT) CAPS Take 2,000 Units by mouth daily.     Cholecalciferol 125 MCG (5000 UT) capsule Take by mouth.     Cholecalciferol 50 MCG (2000 UT) CAPS Take by mouth.     cyanocobalamin (VITAMIN B12) 1000 MCG tablet Take by mouth.     denosumab  (PROLIA ) 60 MG/ML SOSY injection as directed Every 6 months     DULoxetine  (CYMBALTA ) 30 MG capsule Take 3 capsules (90 mg total) by mouth daily. 270 capsule 3   estradiol (ESTRACE) 0.1 MG/GM vaginal cream Place vaginally.     fentaNYL  (DURAGESIC ) 25 MCG/HR Place 1 patch onto the skin every 3 (three) days. 10 patch 0   fluticasone  (FLONASE  ALLERGY RELIEF) 50 MCG/ACT nasal spray 1 spray in each nostril Nasally Once a day     gabapentin  (NEURONTIN ) 100 MG capsule Take 100 mg by  mouth 4 (four) times daily.     HYDROcodone -acetaminophen  (NORCO/VICODIN) 5-325 MG tablet Take 1 tablet by mouth 3 (three) times daily as needed for moderate pain (pain score 4-6). 90 tablet 0   levothyroxine  (SYNTHROID ) 75 MCG tablet Take 75 mcg by mouth daily before breakfast.     magnesium gluconate (MAGONATE) 500 MG tablet Take 500 mg by mouth 2 (two) times daily.     Methylcobalamin 1000 MCG SUBL      naloxone  (NARCAN ) nasal spray 4 mg/0.1 mL Place 1 spray into the nose once.     NONFORMULARY OR COMPOUNDED ITEM Apply 1 Pump topically 4 (four) times daily as needed. Ketamine 10%, Baclofen  2%, Cyclobenzaprine 2%, Ketoprofen 10%, Gabapentin  6%, Bupivacaine 1%, Amitryptiline 5% Dispense 100mg  100 each 3   ondansetron  (ZOFRAN ) 4 MG tablet 1 tablet as needed Orally Twice a day for 30 days     pantoprazole  (PROTONIX ) 40 MG tablet Take  40 mg by mouth daily.     Polyethyl Glycol-Propyl Glycol (SYSTANE FREE OP) Apply 1 drop to eye as needed.     predniSONE  (DELTASONE ) 1 MG tablet Take 2 tablets (2 mg total) by mouth daily with breakfast. 60 tablet 0   Propylene Glycol (SYSTANE COMPLETE PF OP) Apply to eye.     Pyridoxine  HCl (VITAMIN B-6 PO) Vitamin B6     RA GLUCOSAMINE SULFATE 500 MG TABS      rosuvastatin (CRESTOR) 5 MG tablet Take 5 mg by mouth daily.     Vitamin D , Ergocalciferol , (DRISDOL ) 1.25 MG (50000 UNIT) CAPS capsule Take 1 capsule (50,000 Units total) by mouth every 7 (seven) days. 20 capsule 0   No current facility-administered medications on file prior to visit.    Past Surgical History:  Procedure Laterality Date   CHOLECYSTECTOMY  03/2010   COLONOSCOPY  2009   Dr. Author    CYSTOSCOPY     Multiple Cystoscopies with stents or biopsy    ESOPHAGOGASTRODUODENOSCOPY  10/2013   EYE SURGERY     bilateral cataract removal   Gamma knife radiation  01/2018   for trigeminal neuralgia   REFRACTIVE SURGERY Bilateral 2022   RHIZOTOMY Left 04/28/2021   Procedure: Left Percutaneous trigeminal balloon rhizotomy;  Surgeon: Cheryle Debby LABOR, MD;  Location: [facility] OR;  Service: Neurosurgery;  Laterality: Left;   TONSILLECTOMY      Allergies  Allergen Reactions   Dipyridamole Other (See Comments)    Migraines  Other reaction(s): migraine  Persantine  Other reaction(s): Headache  Migraines    Migraines  Other reaction(s): migraine Persantine Other reaction(s): migraine Other reaction(s): Headache Other reaction(s): migraine  Migraines  Migraines  Other reaction(s): migraine Persantine Other reaction(s): migraine Other reaction(s): Headache Other reaction(s): migraine    Migraines    Migraines  Other reaction(s): migraine Persantine Other reaction(s): migraine Other reaction(s): Headache Other reaction(s): migraine   Ciprofloxacin Other (See Comments)    Interstitial cystitis, worsening renal  function   Diclofenac Other (See Comments)    Other Reaction(s): hives   Diclofenac Sodium Other (See Comments) and Hives    Other reaction(s): hives Other reaction(s): hives Other reaction(s): hives   Hydrochlorothiazide  Nausea Only    Chills  Taking at this time lower dose  Chills Taking at this time lower dose  Chills Taking at this time lower dose    Chills Taking at this time lower dose   Other Other (See Comments)    Cat and Dog Dander  Other reaction(s): bladder pain  Other reaction(s): muscle  cramps  Other reaction(s): Unknown  Cat and Dog Dander  Other reaction(s): bladder pain Other reaction(s): muscle cramps Other reaction(s): Unknown  Other Reaction(s): Other (See Comments)  Cat and dog dander/reaction is runny nose sneezing   Oxycodone Hcl Other (See Comments)    Other reaction(s): bladder pain  Other Reaction(s): other   Pantoprazole  Other (See Comments)    Other reaction(s): increased tremors Other reaction(s): increased tremors Other reaction(s): increased tremors  Other Reaction(s): Other (See Comments)  Reaction: Tremor (intolerance)   Pantoprazole  Sodium Other (See Comments)    Other reaction(s): increased tremors  Other Reaction(s): increased tremors   Triamterene Other (See Comments)    Made Tremors worse  Other reaction(s): Other (See Comments)  Made Tremors worse Other reaction(s): Other (See Comments)   Triamterene-Hctz Other (See Comments)    Other reaction(s): muscle cramps   Buprenorphine Other (See Comments)    knocks me out  Other reaction(s): Unknown  Butrans  Other Reaction(s): Drowsiness, Not available, Unknown  knocks me out    knocks me out Other reaction(s): Unknown Butrans Other reaction(s): Unknown Other reaction(s): Unknown   Codeine Nausea Only and Other (See Comments)    Made her feel loopy  Other reaction(s): nausea  Other Reaction(s): GI Intolerance, Other (See Comments), Unknown  Made her feel  loopy Other reaction(s): nausea Other reaction(s): nausea  Made her feel loopy Other reaction(s): nausea Other reaction(s): nausea    Made her feel loopy Other reaction(s): nausea Other reaction(s): nausea   Diclofenac Sodium Hives, Other (See Comments) and Rash    Other reaction(s): hives Other reaction(s): hives Other reaction(s): hives  Other reaction(s): hives Other reaction(s): hives Other reaction(s): hives    Other reaction(s): hives Other reaction(s): hives Other reaction(s): hives   Doxycycline Other (See Comments) and Rash    Other reaction(s): rash  Other reaction(s): rash Other reaction(s): rash Other reaction(s): rash  Other reaction(s): rash Other reaction(s): rash Other reaction(s): rash    Other reaction(s): rash Other reaction(s): rash Other reaction(s): rash   Esomeprazole Other (See Comments)    Jittery, sore throat  Other reaction(s): jittery, sore throat  Other reaction(s): Other  Other Reaction(s): jittery, sore throat  Jittery, sore throat Jittery, sore throat    Jittery, sore throat Other reaction(s): jittery, sore throat Other reaction(s): jittery, sore throat Other reaction(s): Other Other reaction(s): jittery, sore throat  Jittery, sore throat Jittery, sore throat  Jittery, sore throat Other reaction(s): jittery, sore throat Other reaction(s): jittery, sore throat Other reaction(s): Other Other reaction(s): jittery, sore throat    Jittery, sore throat Jittery, sore throat    Jittery, sore throat Other reaction(s): jittery, sore throat Other reaction(s): jittery, sore throat Other reaction(s): Other Other reaction(s): jittery, sore throat  Other Reaction(s): Other (See Comments)  Jittery, sore throat, Jittery, sore throat   Hydrochlorothiazide -Triamterene Hives, Other (See Comments) and Rash    Muscle Cramps  Other reaction(s): muscle cramps  Other reaction(s): Other  Other Reaction(s): muscle cramps  Cramps Cramps    Muscle Cramps  Other  reaction(s): muscle cramps Other reaction(s): Other  Other Reaction(s): Other (See Comments)  Cramps, Cramps   Irbesartan  Diarrhea, Other (See Comments) and Rash    Other reaction(s): loose stools  Other Reaction(s): loose stools  Other reaction(s): loose stools Other reaction(s): loose stools Other reaction(s): loose stools Other reaction(s): loose stools  Other reaction(s): loose stools Other reaction(s): loose stools Other reaction(s): loose stools Other reaction(s): loose stools    Other reaction(s): loose stools Other reaction(s): loose stools Other  reaction(s): loose stools Other reaction(s): loose stools   Lactose Other (See Comments)    GI upset   Lactose Intolerance (Gi) Other (See Comments)    GI upset    Lactulose Other (See Comments), Hives and Rash    GI upset   Losartan Hives, Other (See Comments) and Rash    Hot and chills  Other reaction(s): hot and chills  Other reaction(s): flushing  Other Reaction(s): Fever, hot and chills  Hot and chills Hot and chills    Hot and chills Other reaction(s): hot and chills Other reaction(s): hot and chills Other reaction(s): hot and chills Other reaction(s): flushing Other reaction(s): hot and chills  Other Reaction(s): Other (See Comments)  Hot and chills, Hot and chills   Naproxen Other (See Comments), Hives and Rash    Other reaction(s): worsens interstitial cystitis  Other Reaction(s): worsens interstitial cystitis  Worsens IC symptoms    Other reaction(s): worsens interstitial cystitis Other reaction(s): worsens interstitial cystitis Other reaction(s): worsens interstitial cystitis   Nortriptyline Hives and Other (See Comments)    My body doesn't metabolize it anymore. Other reaction(s): Unknown Other reaction(s): Unknown Other reaction(s): Unknown Other reaction(s): Unknown   Nortriptyline Hcl Other (See Comments)    My body doesn't metabolize it anymore.  Other reaction(s): Unknown   Oxycodone Other (See  Comments)    Interstitial cystitis  Bladder pain  Other reaction(s): bladder pain  Other reaction(s): other  Other Reaction(s): bladder pain  Interstitial cystitis    Interstitial cystitis Bladder pain Other reaction(s): bladder pain Other reaction(s): other    Other reaction(s): bladder pain  Other Reaction(s): Other (See Comments)   Oxycontin [Oxycodone Hcl] Other (See Comments)    Interstitial cystitis    Pennsaid [Diclofenac Sodium] Hives   Telmisartan Hives, Other (See Comments) and Rash    headache  Other reaction(s): sinus pain, ha's  Other reaction(s): headache  Other Reaction(s): Headache, sinus pain, ha's  headache headache    headache Other reaction(s): sinus pain, ha's Other reaction(s): sinus pain, ha's Other reaction(s): sinus pain, ha's Other reaction(s): headache Other reaction(s): sinus pain, ha's  Other Reaction(s): Other (See Comments)  headache, headache   Tramadol Itching and Other (See Comments)    sedation  Other reaction(s): sedation/itching  Other Reaction(s): sedation/itching  sedation sedation    sedation Other reaction(s): sedation/itching Other reaction(s): sedation/itching Other reaction(s): sedation/itching Other reaction(s): sedation/itching  sedation, sedation    BP 122/72   Ht 5' 3 (1.6 m)   Wt 148 lb (67.1 kg)   BMI 26.22 kg/m      06/03/2021   11:13 AM  [facility] Exercise  Frequency of aerobic exercise (# of days/week) 0  Average time in minutes 0  Frequency of strengthening activities (# of days/week) 0        No data to display              Objective:  Physical Exam:  Gen: NAD, comfortable in exam room  Knee Inspection: No bruising, swelling Palpation: No tenderness along patella, medial or lateral joint lines.  ROM: Flexion 0 - 120 Strength: Flexion 5/5, Abduction 5/5, Plantarflexion 5/5, Dorsiflexion 5/5 Special Tests: Ober negative   Assessment & Plan:  Patient is a 83  y.o. female here for left knee pain follow-up. Overall it seems like her knee pain has improved despite her other health conditions flaring this past month. Suspect she had some common peroneal nerve impingement from wearing both of her braces given the distribution of  pain she was describing. Plan for now is to continue conservative management and recommend she only use one brace for her knee. Can consider repeat injection or updated imaging should her pain persist or worsen.   1. Chronic Left Knee Pain  - Exercises for IT band and knee stability  - Bracing with only one brace for the knees - Ice, heat as needed for pain  - Consider repeat steroid injection vs updated imaging should pain worsen - Follow-up in 8 weeks or as needed    Bernardino Grip, MS4 [facility]

## 2023-12-13 NOTE — Patient Instructions (Signed)
 Continue with aquatic and physical therapy. Do home exercises on days you don't go to therapy. Wear the brace only when up and walking around. Follow up with me in about 5 weeks.

## 2023-12-14 ENCOUNTER — Encounter: Payer: Self-pay | Admitting: Physical Medicine and Rehabilitation

## 2023-12-14 ENCOUNTER — Other Ambulatory Visit: Payer: Self-pay | Admitting: Physical Medicine and Rehabilitation

## 2023-12-14 MED ORDER — FENTANYL 25 MCG/HR TD PT72: 1.0000 | MEDICATED_PATCH | TRANSDERMAL | 0 refills | Status: DC

## 2023-12-15 ENCOUNTER — Ambulatory Visit (HOSPITAL_BASED_OUTPATIENT_CLINIC_OR_DEPARTMENT_OTHER): Admitting: Physical Therapy

## 2023-12-15 ENCOUNTER — Encounter (HOSPITAL_BASED_OUTPATIENT_CLINIC_OR_DEPARTMENT_OTHER): Payer: Self-pay | Admitting: Physical Therapy

## 2023-12-15 DIAGNOSIS — R293 Abnormal posture: Secondary | ICD-10-CM | POA: Diagnosis not present

## 2023-12-15 DIAGNOSIS — M6281 Muscle weakness (generalized): Secondary | ICD-10-CM | POA: Diagnosis not present

## 2023-12-15 DIAGNOSIS — M5459 Other low back pain: Secondary | ICD-10-CM

## 2023-12-15 DIAGNOSIS — M25562 Pain in left knee: Secondary | ICD-10-CM | POA: Diagnosis not present

## 2023-12-15 DIAGNOSIS — G8929 Other chronic pain: Secondary | ICD-10-CM

## 2023-12-15 DIAGNOSIS — M25561 Pain in right knee: Secondary | ICD-10-CM | POA: Diagnosis not present

## 2023-12-15 NOTE — Therapy (Signed)
 OUTPATIENT PHYSICAL THERAPY THORACOLUMBAR TREATMENT   Patient Name: [name] MRN: [id number] DOB:06-08-1941, 83 y.o., female Today's Date: 12/15/2023  END OF SESSION:  PT End of Session - 12/15/23 1147     Visit Number 3    Date for PT Re-Evaluation 01/14/24    Authorization Type medicare    Progress Note Due on Visit 10    PT Start Time 1103    PT Stop Time 1143    PT Time Calculation (min) 40 min    Activity Tolerance Patient tolerated treatment well    Behavior During Therapy [facility] for tasks assessed/performed           Past Medical History:  Diagnosis Date   Adenomatous colon polyp 03/24/2018   Allergy    Arthritis    Cataract    Chronic headaches    Depression    Fibromyalgia    Gallstones    GERD (gastroesophageal reflux disease)    Hyperlipemia    Hypertension    Interstitial cystitis    Lactose intolerance    LBBB (left bundle branch block)    Osteoarthritis    Osteopenia    Pneumonia    Thyroid  disease    Tinnitus    Trigeminal neuralgia    Past Surgical History:  Procedure Laterality Date   CHOLECYSTECTOMY  03/2010   COLONOSCOPY  2009   Dr. Author    CYSTOSCOPY     Multiple Cystoscopies with stents or biopsy    ESOPHAGOGASTRODUODENOSCOPY  10/2013   EYE SURGERY     bilateral cataract removal   Gamma knife radiation  01/2018   for trigeminal neuralgia   REFRACTIVE SURGERY Bilateral 2022   RHIZOTOMY Left 04/28/2021   Procedure: Left Percutaneous trigeminal balloon rhizotomy;  Surgeon: Cheryle Debby LABOR, MD;  Location: [facility] OR;  Service: Neurosurgery;  Laterality: Left;   TONSILLECTOMY     Patient Active Problem List   Diagnosis Date Noted   Decreased diffusion capacity 08/21/2018   Trigeminal neuralgia    Lumbar back pain with radiculopathy affecting right lower extremity 01/08/2014   Dizzinesses 01/08/2014    PCP: Ryan Hives MD  REFERRING PROVIDER: Kassie Acquanetta Bradley, MD   REFERRING DIAG: [phone] (ICD-10-CM) -  Muscular deconditioning   Rationale for Evaluation and Treatment: Rehabilitation  THERAPY DIAG:  Muscle weakness (generalized)  Other low back pain  Chronic pain of both knees  ONSET DATE: exacerbation x 6 months  SUBJECTIVE:  SUBJECTIVE STATEMENT: Pt reports virus sent her to er 2 weeks ago spent 1 week in bed. Reports today with complaints of weakness.   From initial evaluation: My head started to hurt so bad after I was dc last time I did not follow up with completing my home programs.  I have gotten weak.  The trigeminal neuralgia is bad and not getting any better with the stimulator.  They want to do another surgery.  My councilor wanted me to come back here.  PERTINENT HISTORY:  Trigeminal neuralgia chronic with stimulator placed Fibromyalgia Polymyalgia rheumatica  PAIN:  Are you having pain? Yes Pain location:  general below neck:LB and knees current 3/10; worst 5/10  Trigeminal neuralgiaL: left sided face current 9/10  Pain description: ache, throb; stabbing Aggravating factors: Body: walking; standing Head sharp; running/trickling water sensation Relieving factors: stimulator; meds  PRECAUTIONS: Fall  RED FLAGS: Motor cortex stimulator   WEIGHT BEARING RESTRICTIONS: No  FALLS:  Has patient fallen in last 6 months? No  LIVING ENVIRONMENT: Lives with: lives with their family and lives with their spouse Lives in: House/apartment Stairs: Yes: Internal: 16 steps; on right going up Has following equipment at home: None   OCCUPATION: retired   PLOF: Independent  PATIENT GOALS: improve strength, return to exercise routine  NEXT MD VISIT: as needed  OBJECTIVE:  Note: Objective measures were completed at Evaluation unless otherwise noted.  DIAGNOSTIC FINDINGS:  X-Ray  10/23 Moderate tricompartmental degenerative changes   PATIENT SURVEYS:  Modified Oswestry To be completed next session/ran out of time 14/50  COGNITION: Overall cognitive status: Within functional limits for tasks assessed                         SENSATION: WFL   EDEMA: none   POSTURE: forward head; left knee valgus deformity; decreased lumbar lordosis   PALPATION: No tenderness  LUMBAR ROM:   Wfl pain with return to upright from flexion  LOWER EXTREMITY ROM:     wfl  LOWER EXTREMITY MMT:     Completed in sitting HD lb Right eval Left eval  Hip flexion 33.8 28.4  Hip extension    Hip abduction 19.1 17.2  Hip adduction    Hip internal rotation    Hip external rotation    Knee flexion    Knee extension 41.4 40.5  Ankle dorsiflexion    Ankle plantarflexion    Ankle inversion    Ankle eversion     (Blank rows = not tested)   FUNCTIONAL TESTS:  Timed up and go (TUG): 14.59 BERG Balance Test           Sit to Stand 3  Standing unsupported 4  Sitting with back unsupported but feet supported 4  Stand to sit  3  Transfers  3  Standing unsupported with eyes closed 4  Standing unsupported feet together 4  From standing position, reach forward with outstretched arm 4  From standing position, pick up object from floor 4  From standing position, turn and look behind over each shoulder 4  Turn 360 4  Standing unsupported, alternately place foot on step 2  Standing unsupported, one foot in front 1  Standing on one leg 1  Total:  45     GAIT: Distance walked: 500 ft Assistive device utilized: None Level of assistance: Complete Independence Comments: decreased step length and cadence, knees knocking. Left hip rotation  TREATMENT  Pt seen for aquatic therapy today.  Treatment took place in water 3.5-4.75 ft in depth at the [facility] pool. Temp of water was 91.  Pt entered/exited the pool via stairs and step to pattern with hand rail.   -walking  using barbell forward/ backward/side stepping  - Hand float carry, walking forward/ backward with single/ bilat RB hand floats - Side Stepping with arm addct/ abdct with RB Hand Floats -1/2 noodle pull down TrA engagement in sitting x 10 then standing staggered x 10 - Braided Sidestepping with Arms Out , UE support on yellow hand floats   PATIENT EDUCATION:  Education details: reacquainting with aquatic therapy  Person educated: Patient Education method: Explanation Education comprehension: verbalized understanding  HOME EXERCISE PROGRAM: [id number] from last session  ASSESSMENT:  CLINICAL IMPRESSION: Pt had a few weeks interlude since last session due to a virus. She is directed through exercises with reduced loading (yellow to RB HB) to ensure toleration without excessive fatigue. She is given 2-3 rest periods both standing and sitting. Pt reports feeling well ,tolerating exercise without difficulty.  ODI completed. Adjusted STG target date.  Goals ongoing     From initial evaluation:  Patient is a 83 y.o. f who was seen today for physical therapy evaluation and treatment for muscular deconditioning.  Pt with multiple co morbidities including fibromyalgia, Polymyalgia rheumatica as well a trigeminal neuralgia.  The later has been the main limiting issue with follow through of prior HEP as instructed about 1 year ago.  She reports no falls just significant pain which has limited her function.  She presents with decreased strength, balance and general pain from myalgias .  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  She will benefit from skilled physical therapy intervention both aquatic and then progression to land when able.  She does report having lists of HEP's so land based will focus on re-teaching program to ensure proper completion. Aquatics to work on strengthening, balance and pain management.  OBJECTIVE IMPAIRMENTS: decreased balance  and decreased strength.    ACTIVITY LIMITATIONS: locomotion level   PARTICIPATION LIMITATIONS: community activity and playing tennis   PERSONAL FACTORS: Age and 1-2 comorbidities: OA and trigeminal neuralgia are also affecting patient's functional outcome.    REHAB POTENTIAL: Good   CLINICAL DECISION MAKING: Evolving/moderate complexity   EVALUATION COMPLEXITY: Moderate   GOALS: Goals reviewed with patient? Yes  SHORT TERM GOALS: Target date: 12/30/23 (extended due to delay in session with virus)  Pt will tolerate full aquatic sessions consistently without increase in pain and with improving function to demonstrate good toleration and effectiveness of intervention.  Baseline: Goal status: INITIAL  2.  Pt will complete 10 minutes of continuous amb submerged to demonstrate improvement on endurance Baseline:  Goal status: INITIAL  3.  Pt to report 0/10 pain in knees and back with exercising submerged Baseline:  Goal status: INITIAL   LONG TERM GOALS: Target date: 01/14/24  Pt to improve on ODI by  13% to demonstrate statistically significant Improvement in function. Baseline: 14/50= 28% Goal status: INITIAL  2.  Pt will be indep with final HEP's (land and aquatic as appropriate) for continued management of condition Baseline:  Goal status: INITIAL  3.  Pt will report decrease in general pain (not trigeminal neuralgia) by at least 50% for improved toleration to activity/quality of life and to demonstrate improved management of pain. Baseline:  Goal status: INITIAL  4.  Pt will improve strength in hips by  5lbs to demonstrate  improved overall physical function Baseline:  Goal status: INITIAL  5.  Pt will perform SLS in 3.6 ft (ue support of hand buoys as needed) x 15s or> improvement in balance ability Baseline:  Goal status: INITIAL  6.  Pt will improve on Tug test to <or= 12s  to demonstrate improvement in lower extremity function, mobility and decreased fall  risk. Baseline: 14.59 Goal status: INITIAL  PLAN:  PT FREQUENCY: 1-2x/week  PT DURATION: 8 weeks  PLANNED INTERVENTIONS: 97164- PT Re-evaluation, 97110-Therapeutic exercises, 97530- Therapeutic activity, [id number]- Neuromuscular re-education, 97535- Self Care, [id number]- Manual therapy, [phone]- Gait training, [phone]- Aquatic Therapy, Patient/Family education, Balance training, Stair training, Taping, Dry Needling, DME instructions, Cryotherapy, and Moist heat.  PLAN FOR NEXT SESSION: land based will focus on re-teaching program to ensure proper completion. Aquatics to work on strengthening, balance and pain management.   Ronal [place]) Maris Abascal MPT 12/15/23 11:47 AM [facility] [address] [place], [place], [address] Phone: [phone]   Fax:  [phone]

## 2023-12-21 DIAGNOSIS — F4542 Pain disorder with related psychological factors: Secondary | ICD-10-CM | POA: Diagnosis not present

## 2023-12-22 ENCOUNTER — Encounter (HOSPITAL_BASED_OUTPATIENT_CLINIC_OR_DEPARTMENT_OTHER): Payer: Self-pay | Admitting: Physical Therapy

## 2023-12-22 ENCOUNTER — Ambulatory Visit (HOSPITAL_BASED_OUTPATIENT_CLINIC_OR_DEPARTMENT_OTHER): Attending: Pulmonary Disease | Admitting: Physical Therapy

## 2023-12-22 DIAGNOSIS — M6281 Muscle weakness (generalized): Secondary | ICD-10-CM | POA: Insufficient documentation

## 2023-12-22 DIAGNOSIS — R293 Abnormal posture: Secondary | ICD-10-CM | POA: Insufficient documentation

## 2023-12-22 DIAGNOSIS — M5459 Other low back pain: Secondary | ICD-10-CM | POA: Insufficient documentation

## 2023-12-22 DIAGNOSIS — M25561 Pain in right knee: Secondary | ICD-10-CM | POA: Diagnosis not present

## 2023-12-22 DIAGNOSIS — M25562 Pain in left knee: Secondary | ICD-10-CM | POA: Insufficient documentation

## 2023-12-22 DIAGNOSIS — G8929 Other chronic pain: Secondary | ICD-10-CM | POA: Insufficient documentation

## 2023-12-22 NOTE — Therapy (Signed)
 OUTPATIENT PHYSICAL THERAPY THORACOLUMBAR TREATMENT   Patient Name: [name] MRN: [id number] DOB:Aug 16, 1940, 83 y.o., female Today's Date: 12/22/2023  END OF SESSION:  PT End of Session - 12/22/23 1155     Visit Number 4    Date for PT Re-Evaluation 01/14/24    Authorization Type medicare    Progress Note Due on Visit 10    PT Start Time 1148    PT Stop Time 1226    PT Time Calculation (min) 38 min    Activity Tolerance Patient tolerated treatment well    Behavior During Therapy [facility] for tasks assessed/performed           Past Medical History:  Diagnosis Date   Adenomatous colon polyp 03/24/2018   Allergy    Arthritis    Cataract    Chronic headaches    Depression    Fibromyalgia    Gallstones    GERD (gastroesophageal reflux disease)    Hyperlipemia    Hypertension    Interstitial cystitis    Lactose intolerance    LBBB (left bundle branch block)    Osteoarthritis    Osteopenia    Pneumonia    Thyroid  disease    Tinnitus    Trigeminal neuralgia    Past Surgical History:  Procedure Laterality Date   CHOLECYSTECTOMY  03/2010   COLONOSCOPY  2009   Dr. Author    CYSTOSCOPY     Multiple Cystoscopies with stents or biopsy    ESOPHAGOGASTRODUODENOSCOPY  10/2013   EYE SURGERY     bilateral cataract removal   Gamma knife radiation  01/2018   for trigeminal neuralgia   REFRACTIVE SURGERY Bilateral 2022   RHIZOTOMY Left 04/28/2021   Procedure: Left Percutaneous trigeminal balloon rhizotomy;  Surgeon: Cheryle Debby LABOR, MD;  Location: [facility] OR;  Service: Neurosurgery;  Laterality: Left;   TONSILLECTOMY     Patient Active Problem List   Diagnosis Date Noted   Decreased diffusion capacity 08/21/2018   Trigeminal neuralgia    Lumbar back pain with radiculopathy affecting right lower extremity 01/08/2014   Dizzinesses 01/08/2014    PCP: Ryan Hives MD  REFERRING PROVIDER: Kassie Acquanetta Bradley, MD   REFERRING DIAG: [phone] (ICD-10-CM) - Muscular  deconditioning   Rationale for Evaluation and Treatment: Rehabilitation  THERAPY DIAG:  Muscle weakness (generalized)  Other low back pain  Chronic pain of both knees  Abnormal posture  ONSET DATE: exacerbation x 6 months  SUBJECTIVE:  SUBJECTIVE STATEMENT: Pt reports she over-did it at last session, could barely walk the next day.  She reports when she exited the pool last session, she felt so heavy, and that she never experienced that feeling before.    From initial evaluation: My head started to hurt so bad after I was dc last time I did not follow up with completing my home programs.  I have gotten weak.  The trigeminal neuralgia is bad and not getting any better with the stimulator.  They want to do another surgery.  My councilor wanted me to come back here.  PERTINENT HISTORY:  Trigeminal neuralgia chronic with stimulator placed Fibromyalgia Polymyalgia rheumatica  PAIN:  Are you having pain? Yes Pain location:  Hips/ knees 7/10  Trigeminal neuralgiaL: left sided face current 9/10  Pain description: ache, throb; stabbing Aggravating factors: Body: walking; standing Head sharp; running/trickling water sensation Relieving factors: stimulator; meds  PRECAUTIONS: Fall  RED FLAGS: Motor cortex stimulator   WEIGHT BEARING RESTRICTIONS: No  FALLS:  Has patient fallen in last 6 months? No  LIVING ENVIRONMENT: Lives with: lives with their family and lives with their spouse Lives in: House/apartment Stairs: Yes: Internal: 16 steps; on right going up Has following equipment at home: None   OCCUPATION: retired   PLOF: Independent  PATIENT GOALS: improve strength, return to exercise routine  NEXT MD VISIT: as needed  OBJECTIVE:  Note: Objective measures were completed at  Evaluation unless otherwise noted.  DIAGNOSTIC FINDINGS:  X-Ray 10/23 Moderate tricompartmental degenerative changes   PATIENT SURVEYS:  Modified Oswestry To be completed next session/ran out of time 14/50  COGNITION: Overall cognitive status: Within functional limits for tasks assessed                         SENSATION: WFL   EDEMA: none   POSTURE: forward head; left knee valgus deformity; decreased lumbar lordosis   PALPATION: No tenderness  LUMBAR ROM:   Wfl pain with return to upright from flexion  LOWER EXTREMITY ROM:     wfl  LOWER EXTREMITY MMT:     Completed in sitting HD lb Right eval Left eval  Hip flexion 33.8 28.4  Hip extension    Hip abduction 19.1 17.2  Hip adduction    Hip internal rotation    Hip external rotation    Knee flexion    Knee extension 41.4 40.5  Ankle dorsiflexion    Ankle plantarflexion    Ankle inversion    Ankle eversion     (Blank rows = not tested)   FUNCTIONAL TESTS:  Timed up and go (TUG): 14.59 BERG Balance Test           Sit to Stand 3  Standing unsupported 4  Sitting with back unsupported but feet supported 4  Stand to sit  3  Transfers  3  Standing unsupported with eyes closed 4  Standing unsupported feet together 4  From standing position, reach forward with outstretched arm 4  From standing position, pick up object from floor 4  From standing position, turn and look behind over each shoulder 4  Turn 360 4  Standing unsupported, alternately place foot on step 2  Standing unsupported, one foot in front 1  Standing on one leg 1  Total:  45     GAIT: Distance walked: 500 ft Assistive device utilized: None Level of assistance: Complete Independence Comments: decreased step length and cadence, knees knocking. Left hip rotation  TREATMENT  12/22/23 Pt seen for aquatic therapy today.  Treatment took place in water 3.5-4.75 ft in depth at the [facility] pool. Temp of water was 91.  Pt  entered/exited the pool via stairs and step to pattern with hand rail.   -unsupported walking forward/ backward, multiple laps - side stepping with arm addct/ abdct x 2 laps  - UE on wall:  leg swings into hip flex/ ext x 10 each;  heel raises x 10; hip abdct/addct x 10 - braiding R/L with arms out (irritated L lower leg - forward walking small kicks, UE support on rainbow hand floats  - marching forward/ backward  - tandem gait forward/backward with rainbow hand floats on surface - return to side stepping  - fig 4 stretch holding rails at stairsx 2 each LE -L stretch x 2   PATIENT EDUCATION:  Education details: reacquainting with aquatic therapy  Person educated: Patient Education method: Explanation Education comprehension: verbalized understanding  HOME EXERCISE PROGRAM: [id number] from last session  ASSESSMENT:  CLINICAL IMPRESSION: Pt was directed through exercises with reduced loading (not pushing resistance of floats under water) to ensure toleration without excessive fatigue. She is observed moving in a cautious slow manner during session. Pt reported gradual reduction of LE pain during session.   Goals ongoing.     From initial evaluation:  Patient is a 83 y.o. f who was seen today for physical therapy evaluation and treatment for muscular deconditioning.  Pt with multiple co morbidities including fibromyalgia, Polymyalgia rheumatica as well a trigeminal neuralgia.  The later has been the main limiting issue with follow through of prior HEP as instructed about 1 year ago.  She reports no falls just significant pain which has limited her function.  She presents with decreased strength, balance and general pain from myalgias .  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  She will benefit from skilled physical therapy intervention both aquatic and then progression to land when able.  She does report having lists of HEP's so land based  will focus on re-teaching program to ensure proper completion. Aquatics to work on strengthening, balance and pain management.  OBJECTIVE IMPAIRMENTS: decreased balance and decreased strength.    ACTIVITY LIMITATIONS: locomotion level   PARTICIPATION LIMITATIONS: community activity and playing tennis   PERSONAL FACTORS: Age and 1-2 comorbidities: OA and trigeminal neuralgia are also affecting patient's functional outcome.    REHAB POTENTIAL: Good   CLINICAL DECISION MAKING: Evolving/moderate complexity   EVALUATION COMPLEXITY: Moderate   GOALS: Goals reviewed with patient? Yes  SHORT TERM GOALS: Target date: 12/30/23 (extended due to delay in session with virus)  Pt will tolerate full aquatic sessions consistently without increase in pain and with improving function to demonstrate good toleration and effectiveness of intervention.  Baseline: Goal status: INITIAL  2.  Pt will complete 10 minutes of continuous amb submerged to demonstrate improvement on endurance Baseline:  Goal status: INITIAL  3.  Pt to report 0/10 pain in knees and back with exercising submerged Baseline:  Goal status: INITIAL   LONG TERM GOALS: Target date: 01/14/24  Pt to improve on ODI by  13% to demonstrate statistically significant Improvement in function. Baseline: 14/50= 28% Goal status: INITIAL  2.  Pt will be indep with final HEP's (land and aquatic as appropriate) for continued management of condition Baseline:  Goal status: INITIAL  3.  Pt will report decrease in general pain (not trigeminal neuralgia) by at  least 50% for improved toleration to activity/quality of life and to demonstrate improved management of pain. Baseline:  Goal status: INITIAL  4.  Pt will improve strength in hips by  5lbs to demonstrate improved overall physical function Baseline:  Goal status: INITIAL  5.  Pt will perform SLS in 3.6 ft (ue support of hand buoys as needed) x 15s or> improvement in balance  ability Baseline:  Goal status: INITIAL  6.  Pt will improve on Tug test to <or= 12s  to demonstrate improvement in lower extremity function, mobility and decreased fall risk. Baseline: 14.59 Goal status: INITIAL  PLAN:  PT FREQUENCY: 1-2x/week  PT DURATION: 8 weeks  PLANNED INTERVENTIONS: 97164- PT Re-evaluation, 97110-Therapeutic exercises, 97530- Therapeutic activity, [id number]- Neuromuscular re-education, 97535- Self Care, 02859- Manual therapy, [phone]- Gait training, [phone]- Aquatic Therapy, Patient/Family education, Balance training, Stair training, Taping, Dry Needling, DME instructions, Cryotherapy, and Moist heat.  PLAN FOR NEXT SESSION: land based will focus on re-teaching program to ensure proper completion. Aquatics to work on strengthening, balance and pain management.  Delon Aquas, PTA 12/22/23 12:28 PM [facility] [address] [place], [place], [address] Phone: [phone]   Fax:  [phone]

## 2023-12-27 ENCOUNTER — Other Ambulatory Visit: Payer: Self-pay | Admitting: Physical Medicine and Rehabilitation

## 2023-12-27 DIAGNOSIS — M79642 Pain in left hand: Secondary | ICD-10-CM | POA: Diagnosis not present

## 2023-12-27 DIAGNOSIS — M2550 Pain in unspecified joint: Secondary | ICD-10-CM | POA: Diagnosis not present

## 2023-12-27 DIAGNOSIS — M25551 Pain in right hip: Secondary | ICD-10-CM | POA: Diagnosis not present

## 2023-12-27 DIAGNOSIS — M170 Bilateral primary osteoarthritis of knee: Secondary | ICD-10-CM | POA: Diagnosis not present

## 2023-12-27 DIAGNOSIS — M25562 Pain in left knee: Secondary | ICD-10-CM | POA: Diagnosis not present

## 2023-12-27 DIAGNOSIS — M797 Fibromyalgia: Secondary | ICD-10-CM | POA: Diagnosis not present

## 2023-12-27 DIAGNOSIS — M549 Dorsalgia, unspecified: Secondary | ICD-10-CM | POA: Diagnosis not present

## 2023-12-27 DIAGNOSIS — M353 Polymyalgia rheumatica: Secondary | ICD-10-CM | POA: Diagnosis not present

## 2023-12-27 DIAGNOSIS — M79672 Pain in left foot: Secondary | ICD-10-CM | POA: Diagnosis not present

## 2023-12-27 DIAGNOSIS — M79671 Pain in right foot: Secondary | ICD-10-CM | POA: Diagnosis not present

## 2023-12-27 DIAGNOSIS — M25552 Pain in left hip: Secondary | ICD-10-CM | POA: Diagnosis not present

## 2023-12-27 DIAGNOSIS — M79641 Pain in right hand: Secondary | ICD-10-CM | POA: Diagnosis not present

## 2023-12-27 DIAGNOSIS — M150 Primary generalized (osteo)arthritis: Secondary | ICD-10-CM | POA: Diagnosis not present

## 2023-12-27 DIAGNOSIS — M79643 Pain in unspecified hand: Secondary | ICD-10-CM | POA: Diagnosis not present

## 2023-12-27 DIAGNOSIS — M25559 Pain in unspecified hip: Secondary | ICD-10-CM | POA: Diagnosis not present

## 2023-12-27 DIAGNOSIS — M25561 Pain in right knee: Secondary | ICD-10-CM | POA: Diagnosis not present

## 2023-12-27 DIAGNOSIS — M810 Age-related osteoporosis without current pathological fracture: Secondary | ICD-10-CM | POA: Diagnosis not present

## 2023-12-27 DIAGNOSIS — M79673 Pain in unspecified foot: Secondary | ICD-10-CM | POA: Diagnosis not present

## 2023-12-27 MED ORDER — HYDROCODONE-ACETAMINOPHEN 5-325 MG PO TABS: 1.0000 | ORAL_TABLET | Freq: Three times a day (TID) | ORAL | 0 refills | Status: DC | PRN

## 2023-12-29 ENCOUNTER — Ambulatory Visit (HOSPITAL_BASED_OUTPATIENT_CLINIC_OR_DEPARTMENT_OTHER): Admitting: Physical Therapy

## 2023-12-29 ENCOUNTER — Encounter (HOSPITAL_BASED_OUTPATIENT_CLINIC_OR_DEPARTMENT_OTHER): Payer: Self-pay | Admitting: Physical Therapy

## 2023-12-29 DIAGNOSIS — M5459 Other low back pain: Secondary | ICD-10-CM

## 2023-12-29 DIAGNOSIS — M6281 Muscle weakness (generalized): Secondary | ICD-10-CM

## 2023-12-29 DIAGNOSIS — R293 Abnormal posture: Secondary | ICD-10-CM | POA: Diagnosis not present

## 2023-12-29 DIAGNOSIS — M25561 Pain in right knee: Secondary | ICD-10-CM | POA: Diagnosis not present

## 2023-12-29 DIAGNOSIS — G8929 Other chronic pain: Secondary | ICD-10-CM | POA: Diagnosis not present

## 2023-12-29 DIAGNOSIS — M25562 Pain in left knee: Secondary | ICD-10-CM | POA: Diagnosis not present

## 2023-12-29 NOTE — Therapy (Signed)
 OUTPATIENT PHYSICAL THERAPY THORACOLUMBAR TREATMENT   Patient Name: [name] MRN: [id number] DOB:1941/06/09, 83 y.o., female Today's Date: 12/29/2023  END OF SESSION:  PT End of Session - 12/29/23 1148     Visit Number 5    Date for PT Re-Evaluation 01/14/24    Authorization Type medicare    Progress Note Due on Visit 10    PT Start Time 1145    PT Stop Time 1223    PT Time Calculation (min) 38 min    Activity Tolerance Patient tolerated treatment well    Behavior During Therapy [facility] for tasks assessed/performed           Past Medical History:  Diagnosis Date   Adenomatous colon polyp 03/24/2018   Allergy    Arthritis    Cataract    Chronic headaches    Depression    Fibromyalgia    Gallstones    GERD (gastroesophageal reflux disease)    Hyperlipemia    Hypertension    Interstitial cystitis    Lactose intolerance    LBBB (left bundle branch block)    Osteoarthritis    Osteopenia    Pneumonia    Thyroid  disease    Tinnitus    Trigeminal neuralgia    Past Surgical History:  Procedure Laterality Date   CHOLECYSTECTOMY  03/2010   COLONOSCOPY  2009   Dr. Author    CYSTOSCOPY     Multiple Cystoscopies with stents or biopsy    ESOPHAGOGASTRODUODENOSCOPY  10/2013   EYE SURGERY     bilateral cataract removal   Gamma knife radiation  01/2018   for trigeminal neuralgia   REFRACTIVE SURGERY Bilateral 2022   RHIZOTOMY Left 04/28/2021   Procedure: Left Percutaneous trigeminal balloon rhizotomy;  Surgeon: Cheryle Debby LABOR, MD;  Location: [facility] OR;  Service: Neurosurgery;  Laterality: Left;   TONSILLECTOMY     Patient Active Problem List   Diagnosis Date Noted   Decreased diffusion capacity 08/21/2018   Trigeminal neuralgia    Lumbar back pain with radiculopathy affecting right lower extremity 01/08/2014   Dizzinesses 01/08/2014    PCP: Ryan Hives MD  REFERRING PROVIDER: Kassie Acquanetta Bradley, MD   REFERRING DIAG: [phone] (ICD-10-CM) - Muscular  deconditioning   Rationale for Evaluation and Treatment: Rehabilitation  THERAPY DIAG:  Muscle weakness (generalized)  Other low back pain  Chronic pain of both knees  ONSET DATE: exacerbation x 6 months  SUBJECTIVE:  SUBJECTIVE STATEMENT: Pt reports she saw rheumatologist and is in a polymyalgia flair which explains all her pain.  Was put on steroid, can tell its already better after 2 days, will just take it easy today.   From initial evaluation: My head started to hurt so bad after I was dc last time I did not follow up with completing my home programs.  I have gotten weak.  The trigeminal neuralgia is bad and not getting any better with the stimulator.  They want to do another surgery.  My councilor wanted me to come back here.  PERTINENT HISTORY:  Trigeminal neuralgia chronic with stimulator placed Fibromyalgia Polymyalgia rheumatica  PAIN:  Are you having pain? Yes Pain location:  Hips/ knees 7/10  Trigeminal neuralgiaL: left sided face current 9/10  Pain description: ache, throb; stabbing Aggravating factors: Body: walking; standing Head sharp; running/trickling water sensation Relieving factors: stimulator; meds  PRECAUTIONS: Fall  RED FLAGS: Motor cortex stimulator   WEIGHT BEARING RESTRICTIONS: No  FALLS:  Has patient fallen in last 6 months? No  LIVING ENVIRONMENT: Lives with: lives with their family and lives with their spouse Lives in: House/apartment Stairs: Yes: Internal: 16 steps; on right going up Has following equipment at home: None   OCCUPATION: retired   PLOF: Independent  PATIENT GOALS: improve strength, return to exercise routine  NEXT MD VISIT: as needed  OBJECTIVE:  Note: Objective measures were completed at Evaluation unless otherwise  noted.  DIAGNOSTIC FINDINGS:  X-Ray 10/23 Moderate tricompartmental degenerative changes   PATIENT SURVEYS:  Modified Oswestry To be completed next session/ran out of time 14/50  COGNITION: Overall cognitive status: Within functional limits for tasks assessed                         SENSATION: WFL   EDEMA: none   POSTURE: forward head; left knee valgus deformity; decreased lumbar lordosis   PALPATION: No tenderness  LUMBAR ROM:   Wfl pain with return to upright from flexion  LOWER EXTREMITY ROM:     wfl  LOWER EXTREMITY MMT:     Completed in sitting HD lb Right eval Left eval  Hip flexion 33.8 28.4  Hip extension    Hip abduction 19.1 17.2  Hip adduction    Hip internal rotation    Hip external rotation    Knee flexion    Knee extension 41.4 40.5  Ankle dorsiflexion    Ankle plantarflexion    Ankle inversion    Ankle eversion     (Blank rows = not tested)   FUNCTIONAL TESTS:  Timed up and go (TUG): 14.59 BERG Balance Test           Sit to Stand 3  Standing unsupported 4  Sitting with back unsupported but feet supported 4  Stand to sit  3  Transfers  3  Standing unsupported with eyes closed 4  Standing unsupported feet together 4  From standing position, reach forward with outstretched arm 4  From standing position, pick up object from floor 4  From standing position, turn and look behind over each shoulder 4  Turn 360 4  Standing unsupported, alternately place foot on step 2  Standing unsupported, one foot in front 1  Standing on one leg 1  Total:  45     GAIT: Distance walked: 500 ft Assistive device utilized: None Level of assistance: Complete Independence Comments: decreased step length and cadence, knees knocking. Left hip rotation  TREATMENT  12/29/23 Pt seen for aquatic therapy today.  Treatment took place in water 3.5-4.75 ft in depth at the [facility] pool. Temp of water was 91.  Pt entered/exited the pool via stairs and  step to pattern with hand rail.   -unsupported walking forward/ backward, side stepping x 10 consecutive minutes. -seated on bench: hip add/abd; LAQ -standing ue support wall: df; pf; hip add/abd; HS curl; relaxed squats -return to walking forward, back and side ways x 10 minutes    PATIENT EDUCATION:  Education details: reacquainting with aquatic therapy  Person educated: Patient Education method: Explanation Education comprehension: verbalized understanding  HOME EXERCISE PROGRAM: [id number] from last session  ASSESSMENT:  CLINICAL IMPRESSION: Pt being treated for polymyalgia Rh. Flare.  Reports pain in hips, lb, knees and feet has reduce some since taking steroid. She is directed through gentle movement patterns and challenged with completing 10 minute of continuous walking which she does complete well meeting a STG. She tolerates session with reduction in pain.  All STGs addressed.       From initial evaluation:  Patient is a 83 y.o. f who was seen today for physical therapy evaluation and treatment for muscular deconditioning.  Pt with multiple co morbidities including fibromyalgia, Polymyalgia rheumatica as well a trigeminal neuralgia.  The later has been the main limiting issue with follow through of prior HEP as instructed about 1 year ago.  She reports no falls just significant pain which has limited her function.  She presents with decreased strength, balance and general pain from myalgias .  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  She will benefit from skilled physical therapy intervention both aquatic and then progression to land when able.  She does report having lists of HEP's so land based will focus on re-teaching program to ensure proper completion. Aquatics to work on strengthening, balance and pain management.  OBJECTIVE IMPAIRMENTS: decreased balance and decreased strength.    ACTIVITY LIMITATIONS: locomotion level    PARTICIPATION LIMITATIONS: community activity and playing tennis   PERSONAL FACTORS: Age and 1-2 comorbidities: OA and trigeminal neuralgia are also affecting patient's functional outcome.    REHAB POTENTIAL: Good   CLINICAL DECISION MAKING: Evolving/moderate complexity   EVALUATION COMPLEXITY: Moderate   GOALS: Goals reviewed with patient? Yes  SHORT TERM GOALS: Target date: 12/30/23 (extended due to delay in session with virus)  Pt will tolerate full aquatic sessions consistently without increase in pain and with improving function to demonstrate good toleration and effectiveness of intervention.  Baseline: Goal status: Met 12/29/23  2.  Pt will complete 10 minutes of continuous amb submerged to demonstrate improvement on endurance Baseline:  Goal status: Met 12/29/23  3.  Pt to report 0/10 pain in knees and back with exercising submerged Baseline:  Goal status: In progress 12/29/23   LONG TERM GOALS: Target date: 01/14/24  Pt to improve on ODI by  13% to demonstrate statistically significant Improvement in function. Baseline: 14/50= 28% Goal status: INITIAL  2.  Pt will be indep with final HEP's (land and aquatic as appropriate) for continued management of condition Baseline:  Goal status: INITIAL  3.  Pt will report decrease in general pain (not trigeminal neuralgia) by at least 50% for improved toleration to activity/quality of life and to demonstrate improved management of pain. Baseline:  Goal status: INITIAL  4.  Pt will improve strength in hips by  5lbs to demonstrate improved overall physical function Baseline:  Goal  status: INITIAL  5.  Pt will perform SLS in 3.6 ft (ue support of hand buoys as needed) x 15s or> improvement in balance ability Baseline:  Goal status: INITIAL  6.  Pt will improve on Tug test to <or= 12s  to demonstrate improvement in lower extremity function, mobility and decreased fall risk. Baseline: 14.59 Goal status: INITIAL  PLAN:  PT  FREQUENCY: 1-2x/week  PT DURATION: 8 weeks  PLANNED INTERVENTIONS: 97164- PT Re-evaluation, 97110-Therapeutic exercises, 97530- Therapeutic activity, [id number]- Neuromuscular re-education, 97535- Self Care, 02859- Manual therapy, [phone]- Gait training, [phone]- Aquatic Therapy, Patient/Family education, Balance training, Stair training, Taping, Dry Needling, DME instructions, Cryotherapy, and Moist heat.  PLAN FOR NEXT SESSION: land based will focus on re-teaching program to ensure proper completion. Aquatics to work on strengthening, balance and pain management.  Ronal Kenefick) Gerren Hoffmeier MPT 12/29/23 12:46 PM [facility] [address] [place], [place], [address] Phone: [phone]   Fax:  [phone]

## 2024-01-04 ENCOUNTER — Encounter (HOSPITAL_BASED_OUTPATIENT_CLINIC_OR_DEPARTMENT_OTHER): Admitting: Physical Therapy

## 2024-01-05 ENCOUNTER — Ambulatory Visit (HOSPITAL_BASED_OUTPATIENT_CLINIC_OR_DEPARTMENT_OTHER)

## 2024-01-05 ENCOUNTER — Encounter (HOSPITAL_BASED_OUTPATIENT_CLINIC_OR_DEPARTMENT_OTHER): Payer: Self-pay

## 2024-01-05 DIAGNOSIS — M25562 Pain in left knee: Secondary | ICD-10-CM | POA: Diagnosis not present

## 2024-01-05 DIAGNOSIS — M5459 Other low back pain: Secondary | ICD-10-CM | POA: Diagnosis not present

## 2024-01-05 DIAGNOSIS — M6281 Muscle weakness (generalized): Secondary | ICD-10-CM

## 2024-01-05 DIAGNOSIS — M25561 Pain in right knee: Secondary | ICD-10-CM | POA: Diagnosis not present

## 2024-01-05 DIAGNOSIS — G8929 Other chronic pain: Secondary | ICD-10-CM

## 2024-01-05 DIAGNOSIS — R293 Abnormal posture: Secondary | ICD-10-CM

## 2024-01-05 NOTE — Therapy (Signed)
 OUTPATIENT PHYSICAL THERAPY THORACOLUMBAR TREATMENT   Patient Name: [name] MRN: [id number] DOB:[birth year], 83 y.o., female Today's Date: 01/05/2024  END OF SESSION:  PT End of Session - 01/05/24 0935     Visit Number 6    Date for PT Re-Evaluation 01/14/24    Authorization Type medicare    Progress Note Due on Visit 10    PT Start Time 0936    PT Stop Time 1015    PT Time Calculation (min) 39 min    Activity Tolerance Patient tolerated treatment well    Behavior During Therapy [facility] for tasks assessed/performed            Past Medical History:  Diagnosis Date   Adenomatous colon polyp 03/24/2018   Allergy    Arthritis    Cataract    Chronic headaches    Depression    Fibromyalgia    Gallstones    GERD (gastroesophageal reflux disease)    Hyperlipemia    Hypertension    Interstitial cystitis    Lactose intolerance    LBBB (left bundle branch block)    Osteoarthritis    Osteopenia    Pneumonia    Thyroid  disease    Tinnitus    Trigeminal neuralgia    Past Surgical History:  Procedure Laterality Date   CHOLECYSTECTOMY  03/2010   COLONOSCOPY  2009   Dr. Author    CYSTOSCOPY     Multiple Cystoscopies with stents or biopsy    ESOPHAGOGASTRODUODENOSCOPY  10/2013   EYE SURGERY     bilateral cataract removal   Gamma knife radiation  01/2018   for trigeminal neuralgia   REFRACTIVE SURGERY Bilateral 2022   RHIZOTOMY Left 04/28/2021   Procedure: Left Percutaneous trigeminal balloon rhizotomy;  Surgeon: Cheryle Debby LABOR, MD;  Location: [facility] OR;  Service: Neurosurgery;  Laterality: Left;   TONSILLECTOMY     Patient Active Problem List   Diagnosis Date Noted   Decreased diffusion capacity 08/21/2018   Trigeminal neuralgia    Lumbar back pain with radiculopathy affecting right lower extremity 01/08/2014   Dizzinesses 01/08/2014    PCP: Ryan Hives MD  REFERRING PROVIDER: Kassie Acquanetta Bradley, MD   REFERRING DIAG: [phone] (ICD-10-CM) -  Muscular deconditioning   Rationale for Evaluation and Treatment: Rehabilitation  THERAPY DIAG:  Muscle weakness (generalized)  Other low back pain  Chronic pain of both knees  Abnormal posture  ONSET DATE: exacerbation x 6 months  SUBJECTIVE:  SUBJECTIVE STATEMENT: Pt reports her surgeon wants her to have a pin pump, however, there is no MD in the vicinity who performs this per pt. I want to get my legs stronger. I'm going to the beach the week after next!   From initial evaluation: My head started to hurt so bad after I was dc last time I did not follow up with completing my home programs.  I have gotten weak.  The trigeminal neuralgia is bad and not getting any better with the stimulator.  They want to do another surgery.  My councilor wanted me to come back here.  PERTINENT HISTORY:  Trigeminal neuralgia chronic with stimulator placed Fibromyalgia Polymyalgia rheumatica  PAIN:  Are you having pain? Yes Pain location:  Hips/ knees 7/10  Trigeminal neuralgiaL: left sided face current 9/10  Pain description: ache, throb; stabbing Aggravating factors: Body: walking; standing Head sharp; running/trickling water sensation Relieving factors: stimulator; meds  PRECAUTIONS: Fall  RED FLAGS: Motor cortex stimulator   WEIGHT BEARING RESTRICTIONS: No  FALLS:  Has patient fallen in last 6 months? No  LIVING ENVIRONMENT: Lives with: lives with their family and lives with their spouse Lives in: House/apartment Stairs: Yes: Internal: 16 steps; on right going up Has following equipment at home: None   OCCUPATION: retired   PLOF: Independent  PATIENT GOALS: improve strength, return to exercise routine  NEXT MD VISIT: as needed  OBJECTIVE:  Note: Objective measures were completed at  Evaluation unless otherwise noted.  DIAGNOSTIC FINDINGS:  X-Ray 10/23 Moderate tricompartmental degenerative changes   PATIENT SURVEYS:  Modified Oswestry To be completed next session/ran out of time 14/50  COGNITION: Overall cognitive status: Within functional limits for tasks assessed                         SENSATION: WFL   EDEMA: none   POSTURE: forward head; left knee valgus deformity; decreased lumbar lordosis   PALPATION: No tenderness  LUMBAR ROM:   Wfl pain with return to upright from flexion  LOWER EXTREMITY ROM:     wfl  LOWER EXTREMITY MMT:     Completed in sitting HD lb Right eval Left eval  Hip flexion 33.8 28.4  Hip extension    Hip abduction 19.1 17.2  Hip adduction    Hip internal rotation    Hip external rotation    Knee flexion    Knee extension 41.4 40.5  Ankle dorsiflexion    Ankle plantarflexion    Ankle inversion    Ankle eversion     (Blank rows = not tested)   FUNCTIONAL TESTS:  Timed up and go (TUG): 14.59 BERG Balance Test           Sit to Stand 3  Standing unsupported 4  Sitting with back unsupported but feet supported 4  Stand to sit  3  Transfers  3  Standing unsupported with eyes closed 4  Standing unsupported feet together 4  From standing position, reach forward with outstretched arm 4  From standing position, pick up object from floor 4  From standing position, turn and look behind over each shoulder 4  Turn 360 4  Standing unsupported, alternately place foot on step 2  Standing unsupported, one foot in front 1  Standing on one leg 1  Total:  45     GAIT: Distance walked: 500 ft Assistive device utilized: None Level of assistance: Complete Independence Comments: decreased step length and cadence, knees knocking.  Left hip rotation  TREATMENT  01/05/24 -Nu-step L4 x1:30, the L 1 for ~20 seconds, but still bothered her so stopped -HR 2x10 -Standing hip abduction 2x10bil -Standing hip extension  2x10bil -sidestepping along railing x3 laps -Sit to stands from elevated plinth x5, elevated slightly higher x5 (some L knee discomfort) -seated clam GTB at thighs 2x10 -seated marching 2x10 -LAQ 5 2x10ea -HSC with RTB 2x10bil   TREATMENT  12/29/23 Pt seen for aquatic therapy today.  Treatment took place in water 3.5-4.75 ft in depth at the [facility] pool. Temp of water was 91.  Pt entered/exited the pool via stairs and step to pattern with hand rail.   -unsupported walking forward/ backward, side stepping x 10 consecutive minutes. -seated on bench: hip add/abd; LAQ -standing ue support wall: df; pf; hip add/abd; HS curl; relaxed squats -return to walking forward, back and side ways x 10 minutes    PATIENT EDUCATION:  Education details: reacquainting with aquatic therapy  Person educated: Patient Education method: Explanation Education comprehension: verbalized understanding  HOME EXERCISE PROGRAM: [id number] from last session  HEP from MD per pt: ITB strecth at wall Supine ITB stretch with strap -sidestepping along wall  ASSESSMENT:  CLINICAL IMPRESSION: Trialled nu-step today which pt was not able to tolerate after a minute and a half. Trialled decreasing resistance which did not help so discontinued. Pt had good tolerance for gentle standing and seated exercises. Mild L knee discomfort with sit to stands, but able to complete sets of 5 reps.  L knee brace was doffed for entire session to allow for increased joint movement. Instructed pt to bring physician prescribed HEP to next land PT session for review.     From initial evaluation:  Patient is a 83 y.o. f who was seen today for physical therapy evaluation and treatment for muscular deconditioning.  Pt with multiple co morbidities including fibromyalgia, Polymyalgia rheumatica as well a trigeminal neuralgia.  The later has been the main limiting issue with follow through of prior HEP as instructed about 1 year  ago.  She reports no falls just significant pain which has limited her function.  She presents with decreased strength, balance and general pain from myalgias .  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  She will benefit from skilled physical therapy intervention both aquatic and then progression to land when able.  She does report having lists of HEP's so land based will focus on re-teaching program to ensure proper completion. Aquatics to work on strengthening, balance and pain management.  OBJECTIVE IMPAIRMENTS: decreased balance and decreased strength.    ACTIVITY LIMITATIONS: locomotion level   PARTICIPATION LIMITATIONS: community activity and playing tennis   PERSONAL FACTORS: Age and 1-2 comorbidities: OA and trigeminal neuralgia are also affecting patient's functional outcome.    REHAB POTENTIAL: Good   CLINICAL DECISION MAKING: Evolving/moderate complexity   EVALUATION COMPLEXITY: Moderate   GOALS: Goals reviewed with patient? Yes  SHORT TERM GOALS: Target date: 12/30/23 (extended due to delay in session with virus)  Pt will tolerate full aquatic sessions consistently without increase in pain and with improving function to demonstrate good toleration and effectiveness of intervention.  Baseline: Goal status: Met 12/29/23  2.  Pt will complete 10 minutes of continuous amb submerged to demonstrate improvement on endurance Baseline:  Goal status: Met 12/29/23  3.  Pt to report 0/10 pain in knees and back with exercising submerged Baseline:  Goal status: In progress 12/29/23  LONG TERM GOALS: Target date: 01/14/24  Pt to improve on ODI by  13% to demonstrate statistically significant Improvement in function. Baseline: 14/50= 28% Goal status: INITIAL  2.  Pt will be indep with final HEP's (land and aquatic as appropriate) for continued management of condition Baseline:  Goal status: INITIAL  3.  Pt will report decrease in  general pain (not trigeminal neuralgia) by at least 50% for improved toleration to activity/quality of life and to demonstrate improved management of pain. Baseline:  Goal status: INITIAL  4.  Pt will improve strength in hips by  5lbs to demonstrate improved overall physical function Baseline:  Goal status: INITIAL  5.  Pt will perform SLS in 3.6 ft (ue support of hand buoys as needed) x 15s or> improvement in balance ability Baseline:  Goal status: INITIAL  6.  Pt will improve on Tug test to <or= 12s  to demonstrate improvement in lower extremity function, mobility and decreased fall risk. Baseline: 14.59 Goal status: INITIAL  PLAN:  PT FREQUENCY: 1-2x/week  PT DURATION: 8 weeks  PLANNED INTERVENTIONS: 97164- PT Re-evaluation, 97110-Therapeutic exercises, 97530- Therapeutic activity, [id number]- Neuromuscular re-education, 97535- Self Care, 02859- Manual therapy, [phone]- Gait training, [phone]- Aquatic Therapy, Patient/Family education, Balance training, Stair training, Taping, Dry Needling, DME instructions, Cryotherapy, and Moist heat.  PLAN FOR NEXT SESSION: land based will focus on re-teaching program to ensure proper completion. Aquatics to work on strengthening, balance and pain management.  Asberry Rodes, PTA  01/05/24 12:04 PM [facility] [address] [place], [place], [address] Phone: [phone]   Fax:  [phone]

## 2024-01-07 ENCOUNTER — Encounter (HOSPITAL_BASED_OUTPATIENT_CLINIC_OR_DEPARTMENT_OTHER): Payer: Self-pay | Admitting: Physical Therapy

## 2024-01-07 ENCOUNTER — Ambulatory Visit (HOSPITAL_BASED_OUTPATIENT_CLINIC_OR_DEPARTMENT_OTHER): Admitting: Physical Therapy

## 2024-01-07 DIAGNOSIS — M25562 Pain in left knee: Secondary | ICD-10-CM | POA: Diagnosis not present

## 2024-01-07 DIAGNOSIS — R293 Abnormal posture: Secondary | ICD-10-CM | POA: Diagnosis not present

## 2024-01-07 DIAGNOSIS — M25561 Pain in right knee: Secondary | ICD-10-CM | POA: Diagnosis not present

## 2024-01-07 DIAGNOSIS — G8929 Other chronic pain: Secondary | ICD-10-CM

## 2024-01-07 DIAGNOSIS — M6281 Muscle weakness (generalized): Secondary | ICD-10-CM

## 2024-01-07 DIAGNOSIS — M5459 Other low back pain: Secondary | ICD-10-CM

## 2024-01-07 NOTE — Therapy (Signed)
 OUTPATIENT PHYSICAL THERAPY THORACOLUMBAR TREATMENT   Patient Name: [name] MRN: [id number] DOB:02/23/1941, 83 y.o., female Today's Date: 01/07/2024  END OF SESSION:  PT End of Session - 01/07/24 1146     Visit Number 7    Date for PT Re-Evaluation 01/14/24    Authorization Type medicare    Progress Note Due on Visit 10    PT Start Time 1148    PT Stop Time 1228    PT Time Calculation (min) 40 min    Activity Tolerance Patient tolerated treatment well    Behavior During Therapy [facility] for tasks assessed/performed            Past Medical History:  Diagnosis Date   Adenomatous colon polyp 03/24/2018   Allergy    Arthritis    Cataract    Chronic headaches    Depression    Fibromyalgia    Gallstones    GERD (gastroesophageal reflux disease)    Hyperlipemia    Hypertension    Interstitial cystitis    Lactose intolerance    LBBB (left bundle branch block)    Osteoarthritis    Osteopenia    Pneumonia    Thyroid  disease    Tinnitus    Trigeminal neuralgia    Past Surgical History:  Procedure Laterality Date   CHOLECYSTECTOMY  03/2010   COLONOSCOPY  2009   Dr. Author    CYSTOSCOPY     Multiple Cystoscopies with stents or biopsy    ESOPHAGOGASTRODUODENOSCOPY  10/2013   EYE SURGERY     bilateral cataract removal   Gamma knife radiation  01/2018   for trigeminal neuralgia   REFRACTIVE SURGERY Bilateral 2022   RHIZOTOMY Left 04/28/2021   Procedure: Left Percutaneous trigeminal balloon rhizotomy;  Surgeon: Cheryle Debby LABOR, MD;  Location: [facility] OR;  Service: Neurosurgery;  Laterality: Left;   TONSILLECTOMY     Patient Active Problem List   Diagnosis Date Noted   Decreased diffusion capacity 08/21/2018   Trigeminal neuralgia    Lumbar back pain with radiculopathy affecting right lower extremity 01/08/2014   Dizzinesses 01/08/2014    PCP: Ryan Hives MD  REFERRING PROVIDER: Kassie Acquanetta Bradley, MD   REFERRING DIAG: [phone] (ICD-10-CM) -  Muscular deconditioning   Rationale for Evaluation and Treatment: Rehabilitation  THERAPY DIAG:  Muscle weakness (generalized)  Other low back pain  Chronic pain of both knees  ONSET DATE: exacerbation x 6 months  SUBJECTIVE:  SUBJECTIVE STATEMENT: Pt reports her surgeon wants her to have a pin pump, however, there is no MD in the vicinity who performs this per pt. I want to get my legs stronger. I'm going to the beach the week after next!   From initial evaluation: My head started to hurt so bad after I was dc last time I did not follow up with completing my home programs.  I have gotten weak.  The trigeminal neuralgia is bad and not getting any better with the stimulator.  They want to do another surgery.  My councilor wanted me to come back here.  PERTINENT HISTORY:  Trigeminal neuralgia chronic with stimulator placed Fibromyalgia Polymyalgia rheumatica  PAIN:  Are you having pain? Yes Pain location:  Hips/ knees 7/10  Trigeminal neuralgiaL: left sided face current 9/10  Pain description: ache, throb; stabbing Aggravating factors: Body: walking; standing Head sharp; running/trickling water sensation Relieving factors: stimulator; meds  PRECAUTIONS: Fall  RED FLAGS: Motor cortex stimulator   WEIGHT BEARING RESTRICTIONS: No  FALLS:  Has patient fallen in last 6 months? No  LIVING ENVIRONMENT: Lives with: lives with their family and lives with their spouse Lives in: House/apartment Stairs: Yes: Internal: 16 steps; on right going up Has following equipment at home: None   OCCUPATION: retired   PLOF: Independent  PATIENT GOALS: improve strength, return to exercise routine  NEXT MD VISIT: as needed  OBJECTIVE:  Note: Objective measures were completed at Evaluation unless  otherwise noted.  DIAGNOSTIC FINDINGS:  X-Ray 10/23 Moderate tricompartmental degenerative changes   PATIENT SURVEYS:  Modified Oswestry To be completed next session/ran out of time 14/50  COGNITION: Overall cognitive status: Within functional limits for tasks assessed                         SENSATION: WFL   EDEMA: none   POSTURE: forward head; left knee valgus deformity; decreased lumbar lordosis   PALPATION: No tenderness  LUMBAR ROM:   Wfl pain with return to upright from flexion  LOWER EXTREMITY ROM:     wfl  LOWER EXTREMITY MMT:     Completed in sitting HD lb Right eval Left eval  Hip flexion 33.8 28.4  Hip extension    Hip abduction 19.1 17.2  Hip adduction    Hip internal rotation    Hip external rotation    Knee flexion    Knee extension 41.4 40.5  Ankle dorsiflexion    Ankle plantarflexion    Ankle inversion    Ankle eversion     (Blank rows = not tested)   FUNCTIONAL TESTS:  Timed up and go (TUG): 14.59 BERG Balance Test           Sit to Stand 3  Standing unsupported 4  Sitting with back unsupported but feet supported 4  Stand to sit  3  Transfers  3  Standing unsupported with eyes closed 4  Standing unsupported feet together 4  From standing position, reach forward with outstretched arm 4  From standing position, pick up object from floor 4  From standing position, turn and look behind over each shoulder 4  Turn 360 4  Standing unsupported, alternately place foot on step 2  Standing unsupported, one foot in front 1  Standing on one leg 1  Total:  45     GAIT: Distance walked: 500 ft Assistive device utilized: None Level of assistance: Complete Independence Comments: decreased step length and cadence, knees knocking.  Left hip rotation   [facility] Adult PT Treatment:                                                DATE: 01/07/24 Pt seen for aquatic therapy today.  Treatment took place in water 3.5-4.75 ft in depth at the [facility] pool. Temp of water was 91.  Pt entered/exited the pool via stairs and step to pattern with hand rail.   Exercises - Hand float carry   - Side Stepping with Hand Floats   - Braided Sidestepping with Arms Out  - Noodle press - Standing 'L' Stretch at [employer]  - Sit to Stand  - Standing 3-Way Leg Reach with Ankle Floats   - Heel Toe Raises with Counter Support   - Standing Hip Abduction   - Forward Backward Leg Swing - hold wall or noodle    - Walking March   - Single Leg Stance  - Walking Tandem Stance   Pt requires the buoyancy and hydrostatic pressure of water for support, and to offload joints by unweighting joint load by at least 50 % in navel deep water and by at least 75-80% in chest to neck deep water.  Viscosity of the water is needed for resistance of strengthening. Water current perturbations provides challenge to standing balance requiring increased core activation.  TREATMENT  01/05/24 -Nu-step L4 x1:30, the L 1 for ~20 seconds, but still bothered her so stopped -HR 2x10 -Standing hip abduction 2x10bil -Standing hip extension 2x10bil -sidestepping along railing x3 laps -Sit to stands from elevated plinth x5, elevated slightly higher x5 (some L knee discomfort) -seated clam GTB at thighs 2x10 -seated marching 2x10 -LAQ 5 2x10ea -HSC with RTB 2x10bil   TREATMENT  12/29/23 Pt seen for aquatic therapy today.  Treatment took place in water 3.5-4.75 ft in depth at the [facility] pool. Temp of water was 91.  Pt entered/exited the pool via stairs and step to pattern with hand rail.   -unsupported walking forward/ backward, side stepping x 10 consecutive minutes. -seated on bench: hip add/abd; LAQ -standing ue support wall: df; pf; hip add/abd; HS curl; relaxed squats -return to walking forward, back and side ways x 10 minutes    PATIENT EDUCATION:  Education details: reacquainting with aquatic therapy  Person educated: Patient Education method:  Explanation Education comprehension: verbalized understanding  HOME EXERCISE PROGRAM: [id number] from last session    HEP from MD per pt: ITB strecth at wall Supine ITB stretch with strap -sidestepping along wall  ASSESSMENT:  CLINICAL IMPRESSION: Pt with laminated copy of original aquatic HEP.  Pt instructed through ~ 1/2 of it being provided with vc and demonstration as well as written on program for clarifications. She has forgotten some of the exercises and required extra time to gain motor plans. She completes without pain and good toleration. Plan for pt to be seen 1 last time on land with instruction through MD's exercise program he issued.  If time allows please complete TUG and SLS and instruct on any machines in gym that are approp for her to use. Then return to aquatic for last visit with DC.        From initial evaluation:  Patient is a 83 y.o. f who was seen today for physical therapy evaluation and treatment for muscular deconditioning.  Pt with multiple  co morbidities including fibromyalgia, Polymyalgia rheumatica as well a trigeminal neuralgia.  The later has been the main limiting issue with follow through of prior HEP as instructed about 1 year ago.  She reports no falls just significant pain which has limited her function.  She presents with decreased strength, balance and general pain from myalgias .  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  She will benefit from skilled physical therapy intervention both aquatic and then progression to land when able.  She does report having lists of HEP's so land based will focus on re-teaching program to ensure proper completion. Aquatics to work on strengthening, balance and pain management.  OBJECTIVE IMPAIRMENTS: decreased balance and decreased strength.    ACTIVITY LIMITATIONS: locomotion level   PARTICIPATION LIMITATIONS: community activity and playing tennis   PERSONAL  FACTORS: Age and 1-2 comorbidities: OA and trigeminal neuralgia are also affecting patient's functional outcome.    REHAB POTENTIAL: Good   CLINICAL DECISION MAKING: Evolving/moderate complexity   EVALUATION COMPLEXITY: Moderate   GOALS: Goals reviewed with patient? Yes  SHORT TERM GOALS: Target date: 12/30/23 (extended due to delay in session with virus)  Pt will tolerate full aquatic sessions consistently without increase in pain and with improving function to demonstrate good toleration and effectiveness of intervention.  Baseline: Goal status: Met 12/29/23  2.  Pt will complete 10 minutes of continuous amb submerged to demonstrate improvement on endurance Baseline:  Goal status: Met 12/29/23  3.  Pt to report 0/10 pain in knees and back with exercising submerged Baseline:  Goal status: In progress 12/29/23   LONG TERM GOALS: Target date: 01/14/24  Pt to improve on ODI by  13% to demonstrate statistically significant Improvement in function. Baseline: 14/50= 28% Goal status: INITIAL  2.  Pt will be indep with final HEP's (land and aquatic as appropriate) for continued management of condition Baseline:  Goal status: In progress 01/07/24  3.  Pt will report decrease in general pain (not trigeminal neuralgia) by at least 50% for improved toleration to activity/quality of life and to demonstrate improved management of pain. Baseline:  Goal status: INITIAL  4.  Pt will improve strength in hips by  5lbs to demonstrate improved overall physical function Baseline:  Goal status: INITIAL  5.  Pt will perform SLS in 3.6 ft (ue support of hand buoys as needed) x 15s or> improvement in balance ability Baseline:  Goal status: INITIAL  6.  Pt will improve on Tug test to <or= 12s  to demonstrate improvement in lower extremity function, mobility and decreased fall risk. Baseline: 14.59 Goal status: INITIAL  PLAN:  PT FREQUENCY: 1-2x/week  PT DURATION: 8 weeks  PLANNED  INTERVENTIONS: 97164- PT Re-evaluation, 97110-Therapeutic exercises, 97530- Therapeutic activity, [id number]- Neuromuscular re-education, 97535- Self Care, [id number]- Manual therapy, [phone]- Gait training, [phone]- Aquatic Therapy, Patient/Family education, Balance training, Stair training, Taping, Dry Needling, DME instructions, Cryotherapy, and Moist heat.  PLAN FOR NEXT SESSION: land based will focus on re-teaching program to ensure proper completion. Aquatics to work on strengthening, balance and pain management.  Ronal [place]) Ariana Juul MPT 01/07/24 11:48 AM [facility] [address] [place], [place], [address] Phone: [phone]   Fax:  [phone]

## 2024-01-10 ENCOUNTER — Other Ambulatory Visit: Payer: Self-pay | Admitting: Physical Medicine and Rehabilitation

## 2024-01-10 DIAGNOSIS — H353123 Nonexudative age-related macular degeneration, left eye, advanced atrophic without subfoveal involvement: Secondary | ICD-10-CM | POA: Diagnosis not present

## 2024-01-10 MED ORDER — FENTANYL 25 MCG/HR TD PT72: 1.0000 | MEDICATED_PATCH | TRANSDERMAL | 0 refills | Status: DC

## 2024-01-11 ENCOUNTER — Other Ambulatory Visit: Payer: Self-pay | Admitting: Physical Medicine and Rehabilitation

## 2024-01-11 ENCOUNTER — Ambulatory Visit (HOSPITAL_BASED_OUTPATIENT_CLINIC_OR_DEPARTMENT_OTHER): Admitting: Physical Therapy

## 2024-01-11 DIAGNOSIS — R293 Abnormal posture: Secondary | ICD-10-CM | POA: Diagnosis not present

## 2024-01-11 DIAGNOSIS — M5459 Other low back pain: Secondary | ICD-10-CM

## 2024-01-11 DIAGNOSIS — M25561 Pain in right knee: Secondary | ICD-10-CM | POA: Diagnosis not present

## 2024-01-11 DIAGNOSIS — M6281 Muscle weakness (generalized): Secondary | ICD-10-CM

## 2024-01-11 DIAGNOSIS — G8929 Other chronic pain: Secondary | ICD-10-CM

## 2024-01-11 DIAGNOSIS — M25562 Pain in left knee: Secondary | ICD-10-CM | POA: Diagnosis not present

## 2024-01-11 MED ORDER — FENTANYL 25 MCG/HR TD PT72: 1.0000 | MEDICATED_PATCH | TRANSDERMAL | 0 refills | Status: DC

## 2024-01-11 NOTE — Therapy (Signed)
 OUTPATIENT PHYSICAL THERAPY THORACOLUMBAR TREATMENT   Patient Name: [name] MRN: [id number] DOB:September 29, 1940, 83 y.o., female Today's Date: 01/12/2024  END OF SESSION:  PT End of Session - 01/11/24 1253     Visit Number 8    Date for PT Re-Evaluation 01/14/24    Authorization Type medicare    PT Start Time 1154    PT Stop Time 1236    PT Time Calculation (min) 42 min    Activity Tolerance Patient tolerated treatment well    Behavior During Therapy [facility] for tasks assessed/performed             Past Medical History:  Diagnosis Date   Adenomatous colon polyp 03/24/2018   Allergy    Arthritis    Cataract    Chronic headaches    Depression    Fibromyalgia    Gallstones    GERD (gastroesophageal reflux disease)    Hyperlipemia    Hypertension    Interstitial cystitis    Lactose intolerance    LBBB (left bundle branch block)    Osteoarthritis    Osteopenia    Pneumonia    Thyroid  disease    Tinnitus    Trigeminal neuralgia    Past Surgical History:  Procedure Laterality Date   CHOLECYSTECTOMY  03/2010   COLONOSCOPY  2009   Dr. Author    CYSTOSCOPY     Multiple Cystoscopies with stents or biopsy    ESOPHAGOGASTRODUODENOSCOPY  10/2013   EYE SURGERY     bilateral cataract removal   Gamma knife radiation  01/2018   for trigeminal neuralgia   REFRACTIVE SURGERY Bilateral 2022   RHIZOTOMY Left 04/28/2021   Procedure: Left Percutaneous trigeminal balloon rhizotomy;  Surgeon: Cheryle Debby LABOR, MD;  Location: [facility] OR;  Service: Neurosurgery;  Laterality: Left;   TONSILLECTOMY     Patient Active Problem List   Diagnosis Date Noted   Decreased diffusion capacity 08/21/2018   Trigeminal neuralgia    Lumbar back pain with radiculopathy affecting right lower extremity 01/08/2014   Dizzinesses 01/08/2014    PCP: Ryan Hives MD  REFERRING PROVIDER: Kassie Acquanetta Bradley, MD   REFERRING DIAG: [phone] (ICD-10-CM) - Muscular deconditioning   Rationale  for Evaluation and Treatment: Rehabilitation  THERAPY DIAG:  Muscle weakness (generalized)  Other low back pain  Chronic pain of both knees  ONSET DATE: exacerbation x 6 months  SUBJECTIVE:                                                                                                                                                                                           SUBJECTIVE STATEMENT: Pt  states she felt good after prior land based treatment and felt stronger.  Pt has L knee pain sitting in the car with her knee flexed.  She feels that her L knee wants to give out on her when she gets out of a car.  Pt uses a cane ascending steps.     PERTINENT HISTORY:  Trigeminal neuralgia chronic with stimulator placed Fibromyalgia Polymyalgia rheumatica  PAIN:  Are you having pain? Yes Pain location:  Hips/ knees 7/10  Trigeminal neuralgiaL: left sided face current 9/10  Pain description: ache, throb; stabbing Aggravating factors: Body: walking; standing Head sharp; running/trickling water sensation Relieving factors: stimulator; meds  PRECAUTIONS: Fall  RED FLAGS: Motor cortex stimulator   WEIGHT BEARING RESTRICTIONS: No  FALLS:  Has patient fallen in last 6 months? No  LIVING ENVIRONMENT: Lives with: lives with their family and lives with their spouse Lives in: House/apartment Stairs: Yes: Internal: 16 steps; on right going up Has following equipment at home: None   OCCUPATION: retired   PLOF: Independent  PATIENT GOALS: improve strength, return to exercise routine  NEXT MD VISIT: as needed  OBJECTIVE:  Note: Objective measures were completed at Evaluation unless otherwise noted.  DIAGNOSTIC FINDINGS:  X-Ray 10/23 Moderate tricompartmental degenerative changes   PATIENT SURVEYS:  Modified Oswestry To be completed next session/ran out of time 14/50  COGNITION: Overall cognitive status: Within functional limits for tasks assessed                          SENSATION: WFL   EDEMA: none   POSTURE: forward head; left knee valgus deformity; decreased lumbar lordosis   PALPATION: No tenderness  LUMBAR ROM:   Wfl pain with return to upright from flexion  LOWER EXTREMITY ROM:     wfl  LOWER EXTREMITY MMT:     Completed in sitting HD lb Right eval Left eval  Hip flexion 33.8 28.4  Hip extension    Hip abduction 19.1 17.2  Hip adduction    Hip internal rotation    Hip external rotation    Knee flexion    Knee extension 41.4 40.5  Ankle dorsiflexion    Ankle plantarflexion    Ankle inversion    Ankle eversion     (Blank rows = not tested)   FUNCTIONAL TESTS:  Timed up and go (TUG): 14.59 BERG Balance Test           Sit to Stand 3  Standing unsupported 4  Sitting with back unsupported but feet supported 4  Stand to sit  3  Transfers  3  Standing unsupported with eyes closed 4  Standing unsupported feet together 4  From standing position, reach forward with outstretched arm 4  From standing position, pick up object from floor 4  From standing position, turn and look behind over each shoulder 4  Turn 360 4  Standing unsupported, alternately place foot on step 2  Standing unsupported, one foot in front 1  Standing on one leg 1  Total:  45     GAIT: Distance walked: 500 ft Assistive device utilized: None Level of assistance: Complete Independence Comments: decreased step length and cadence, knees knocking. Left hip rotation   01/11/24  Reviewed HEP from MD. LAQ 2x10 bilat Supine SLR 2x10 Supine ITB stretch with strap Seated clams with GTB 2x10 Standing hip abduction 2x10 with UE support Standing HS curls with UE support 2x10  Updated HEP and gave pt a HEP  handout.  Pt educated pt in correct form and appropriate frequency.   -Nu-step L4 x1:30, the L 1 for ~20 seconds, but still bothered her so stopped -HR 2x10 -Standing hip abduction 2x10bil -Standing hip extension 2x10bil -sidestepping along railing  x3 laps -Sit to stands from elevated plinth x5, elevated slightly higher x5 (some L knee discomfort) -seated clam GTB at thighs 2x10 -seated marching 2x10 -LAQ 5 2x10ea -HSC with RTB 2x10bil    [facility] Adult PT Treatment:                                                DATE: 01/07/24 Pt seen for aquatic therapy today.  Treatment took place in water 3.5-4.75 ft in depth at the [facility] pool. Temp of water was 91.  Pt entered/exited the pool via stairs and step to pattern with hand rail.   Exercises - Hand float carry   - Side Stepping with Hand Floats   - Braided Sidestepping with Arms Out  - Noodle press - Standing 'L' Stretch at [employer]  - Sit to Stand  - Standing 3-Way Leg Reach with Ankle Floats   - Heel Toe Raises with Counter Support   - Standing Hip Abduction   - Forward Backward Leg Swing - hold wall or noodle    - Walking March   - Single Leg Stance  - Walking Tandem Stance   Pt requires the buoyancy and hydrostatic pressure of water for support, and to offload joints by unweighting joint load by at least 50 % in navel deep water and by at least 75-80% in chest to neck deep water.  Viscosity of the water is needed for resistance of strengthening. Water current perturbations provides challenge to standing balance requiring increased core activation.  TREATMENT  01/05/24 -Nu-step L4 x1:30, the L 1 for ~20 seconds, but still bothered her so stopped -HR 2x10 -Standing hip abduction 2x10bil -Standing hip extension 2x10bil -sidestepping along railing x3 laps -Sit to stands from elevated plinth x5, elevated slightly higher x5 (some L knee discomfort) -seated clam GTB at thighs 2x10 -seated marching 2x10 -LAQ 5 2x10ea -HSC with RTB 2x10bil   TREATMENT  12/29/23 Pt seen for aquatic therapy today.  Treatment took place in water 3.5-4.75 ft in depth at the [facility] pool. Temp of water was 91.  Pt entered/exited the pool via stairs and step to pattern  with hand rail.   -unsupported walking forward/ backward, side stepping x 10 consecutive minutes. -seated on bench: hip add/abd; LAQ -standing ue support wall: df; pf; hip add/abd; HS curl; relaxed squats -return to walking forward, back and side ways x 10 minutes    PATIENT EDUCATION:  Education details:  HEP, POC, exercise form and rationale.  PT answered pt's questions.   Person educated: Patient Education method: Explanation, demonstration, verbal cues, handout Education comprehension: verbalized understanding, returned demonstration, verbal cues required  HOME EXERCISE PROGRAM: [id number] from last session    HEP from MD per pt: ITB strecth at wall Supine ITB stretch with strap -sidestepping along wall  Updated HEP: Access Code: [email or web address] URL: [email or web address] Date: 01/11/2024 Prepared by: Mose Minerva  Exercises - Seated Hip Abduction with Resistance  - 1 x daily - 4 x weekly - 2 sets - 10 reps  ASSESSMENT:  CLINICAL IMPRESSION: Pt brought in her HEP from Dr. Cleatrice. HEP consists  of supine SLR, LAQ, standing HS curls, ITB stretching, and hip abduction exercises.  PT reviewed HEP and instructed pt in correct form with exercises.  Pt was wearing knee brace on L knee and Pt removed brace for exercises.  Pt performed home exercises with PT instruction and cuing and pt performed exercises well.  Pt didn't perform the Nustep due to pt reporting it bothering her L knee.  PT updated HEP with seated clams and gave pt a handout.  Pt demonstrates good understanding of HEP. She responded well to Rx having no c/o's after Rx.  PT answered pt's questions and pt was appreciative.  OBJECTIVE IMPAIRMENTS: decreased balance and decreased strength.    ACTIVITY LIMITATIONS: locomotion level   PARTICIPATION LIMITATIONS: community activity and playing tennis   PERSONAL FACTORS: Age and 1-2 comorbidities: OA and trigeminal neuralgia are also affecting patient's functional  outcome.    REHAB POTENTIAL: Good   CLINICAL DECISION MAKING: Evolving/moderate complexity   EVALUATION COMPLEXITY: Moderate   GOALS: Goals reviewed with patient? Yes  SHORT TERM GOALS: Target date: 12/30/23 (extended due to delay in session with virus)  Pt will tolerate full aquatic sessions consistently without increase in pain and with improving function to demonstrate good toleration and effectiveness of intervention.  Baseline: Goal status: Met 12/29/23  2.  Pt will complete 10 minutes of continuous amb submerged to demonstrate improvement on endurance Baseline:  Goal status: Met 12/29/23  3.  Pt to report 0/10 pain in knees and back with exercising submerged Baseline:  Goal status: In progress 12/29/23   LONG TERM GOALS: Target date: 01/14/24  Pt to improve on ODI by  13% to demonstrate statistically significant Improvement in function. Baseline: 14/50= 28% Goal status: INITIAL  2.  Pt will be indep with final HEP's (land and aquatic as appropriate) for continued management of condition Baseline:  Goal status: In progress 01/07/24  3.  Pt will report decrease in general pain (not trigeminal neuralgia) by at least 50% for improved toleration to activity/quality of life and to demonstrate improved management of pain. Baseline:  Goal status: INITIAL  4.  Pt will improve strength in hips by  5lbs to demonstrate improved overall physical function Baseline:  Goal status: INITIAL  5.  Pt will perform SLS in 3.6 ft (ue support of hand buoys as needed) x 15s or> improvement in balance ability Baseline:  Goal status: INITIAL  6.  Pt will improve on Tug test to <or= 12s  to demonstrate improvement in lower extremity function, mobility and decreased fall risk. Baseline: 14.59 Goal status: INITIAL  PLAN:  PT FREQUENCY: 1-2x/week  PT DURATION: 8 weeks  PLANNED INTERVENTIONS: 97164- PT Re-evaluation, 97110-Therapeutic exercises, 97530- Therapeutic activity, [id number]-  Neuromuscular re-education, 97535- Self Care, [id number]- Manual therapy, [phone]- Gait training, [phone]- Aquatic Therapy, Patient/Family education, Balance training, Stair training, Taping, Dry Needling, DME instructions, Cryotherapy, and Moist heat.  PLAN FOR NEXT SESSION: land based will focus on re-teaching program to ensure proper completion. Aquatics to work on strengthening, balance and pain management.  Leigh Minerva III PT, DPT 01/12/24 3:11 PM   [facility] [address] [place], [place], [address] Phone: [phone]   Fax:  [phone]

## 2024-01-12 ENCOUNTER — Encounter (HOSPITAL_BASED_OUTPATIENT_CLINIC_OR_DEPARTMENT_OTHER): Payer: Self-pay | Admitting: Physical Therapy

## 2024-01-12 ENCOUNTER — Ambulatory Visit: Admitting: Family Medicine

## 2024-01-12 VITALS — BP 114/68 | Ht 63.0 in | Wt 144.0 lb

## 2024-01-12 DIAGNOSIS — M7632 Iliotibial band syndrome, left leg: Secondary | ICD-10-CM

## 2024-01-12 NOTE — Progress Notes (Addendum)
 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  She was last seen 6/23 at which time she endorsed mild improvement in pain.  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  Continued conservative management was recommended as well as only using on a brace on her kneee.  A 5-week follow-up was recommended for reassessment.  In the interim she attended physical therapy and reports today that the pain in the lateral portion of her left knee has significantly improved.  She is also completing a prednisone  taper that was prescribed by her rheumatologist for management of a PMR flare.  She believes that the combination of physical therapy and prednisone  have improved her symptoms.  Past Medical History:  Diagnosis Date   Adenomatous colon polyp 03/24/2018   Allergy    Arthritis    Cataract    Chronic headaches    Depression    Fibromyalgia    Gallstones    GERD (gastroesophageal reflux disease)    Hyperlipemia    Hypertension    Interstitial cystitis    Lactose intolerance    LBBB (left bundle branch block)    Osteoarthritis    Osteopenia    Pneumonia    Thyroid  disease    Tinnitus    Trigeminal neuralgia     Current Outpatient Medications on File Prior to Visit  Medication Sig Dispense Refill   acetaminophen  (TYLENOL ) 500 MG tablet Take by mouth.     Acetylcysteine (NAC) 600 MG CAPS Take 1 capsule (600 mg total) by mouth 2 (two) times daily. 60 capsule 0   ACIDOPHILUS LACTOBACILLUS PO Take 1 tablet by mouth daily.     amLODipine (NORVASC) 2.5 MG tablet Take 2.5 mg by mouth daily.     amLODipine (NORVASC) 5 MG tablet Take 1 tablet by mouth once daily Orally Once a day for 90 days     benzocaine  (ORAJEL) 10 % mucosal gel Use as directed 1 application in the mouth or throat as needed for mouth pain. 5.3 g 0   calcium  carbonate (SUPER CALCIUM ) 1500 (600  Ca) MG TABS tablet 1 tablet with meals Orally Once a day     cetirizine (ZYRTEC) 10 MG chewable tablet Chew by mouth.     Cholecalciferol (VITAMIN D -3) 25 MCG (1000 UT) CAPS Take 2,000 Units by mouth daily.     Cholecalciferol 125 MCG (5000 UT) capsule Take by mouth.     Cholecalciferol 50 MCG (2000 UT) CAPS Take by mouth.     cyanocobalamin (VITAMIN B12) 1000 MCG tablet Take by mouth.     denosumab  (PROLIA ) 60 MG/ML SOSY injection as directed Every 6 months     DULoxetine  (CYMBALTA ) 30 MG capsule Take 3 capsules (90 mg total) by mouth daily. 270 capsule 3   estradiol (ESTRACE) 0.1 MG/GM vaginal cream Place vaginally.     fentaNYL  (DURAGESIC ) 25 MCG/HR Place 1 patch onto the skin every 3 (three) days. 15 patch 0   fluticasone  (FLONASE  ALLERGY RELIEF) 50 MCG/ACT nasal spray 1 spray in each nostril Nasally Once a day     gabapentin  (NEURONTIN ) 100 MG capsule Take 100 mg by mouth 4 (four) times daily.     HYDROcodone -acetaminophen  (NORCO/VICODIN) 5-325 MG tablet Take 1 tablet by mouth 3 (three) times daily as needed for moderate pain (pain score 4-6). 90 tablet 0   levothyroxine  (SYNTHROID ) 75 MCG tablet Take  75 mcg by mouth daily before breakfast.     magnesium gluconate (MAGONATE) 500 MG tablet Take 500 mg by mouth 2 (two) times daily.     Methylcobalamin 1000 MCG SUBL      naloxone  (NARCAN ) nasal spray 4 mg/0.1 mL Place 1 spray into the nose once.     NONFORMULARY OR COMPOUNDED ITEM Apply 1 Pump topically 4 (four) times daily as needed. Ketamine 10%, Baclofen  2%, Cyclobenzaprine 2%, Ketoprofen 10%, Gabapentin  6%, Bupivacaine 1%, Amitryptiline 5% Dispense 100mg  100 each 3   ondansetron  (ZOFRAN ) 4 MG tablet 1 tablet as needed Orally Twice a day for 30 days     pantoprazole  (PROTONIX ) 40 MG tablet Take 40 mg by mouth daily.     Polyethyl Glycol-Propyl Glycol (SYSTANE FREE OP) Apply 1 drop to eye as needed.     predniSONE  (DELTASONE ) 1 MG tablet Take 2 tablets (2 mg total) by mouth daily with  breakfast. 60 tablet 0   Propylene Glycol (SYSTANE COMPLETE PF OP) Apply to eye.     Pyridoxine  HCl (VITAMIN B-6 PO) Vitamin B6     RA GLUCOSAMINE SULFATE 500 MG TABS      rosuvastatin (CRESTOR) 5 MG tablet Take 5 mg by mouth daily.     Vitamin D , Ergocalciferol , (DRISDOL ) 1.25 MG (50000 UNIT) CAPS capsule Take 1 capsule (50,000 Units total) by mouth every 7 (seven) days. 20 capsule 0   No current facility-administered medications on file prior to visit.    Past Surgical History:  Procedure Laterality Date   CHOLECYSTECTOMY  03/2010   COLONOSCOPY  2009   Dr. Author    CYSTOSCOPY     Multiple Cystoscopies with stents or biopsy    ESOPHAGOGASTRODUODENOSCOPY  10/2013   EYE SURGERY     bilateral cataract removal   Gamma knife radiation  01/2018   for trigeminal neuralgia   REFRACTIVE SURGERY Bilateral 2022   RHIZOTOMY Left 04/28/2021   Procedure: Left Percutaneous trigeminal balloon rhizotomy;  Surgeon: Cheryle Debby LABOR, MD;  Location: [facility] OR;  Service: Neurosurgery;  Laterality: Left;   TONSILLECTOMY      Allergies  Allergen Reactions   Dipyridamole Other (See Comments)    Migraines  Other reaction(s): migraine  Persantine  Other reaction(s): Headache  Migraines    Migraines  Other reaction(s): migraine Persantine Other reaction(s): migraine Other reaction(s): Headache Other reaction(s): migraine  Migraines  Migraines  Other reaction(s): migraine Persantine Other reaction(s): migraine Other reaction(s): Headache Other reaction(s): migraine    Migraines    Migraines  Other reaction(s): migraine Persantine Other reaction(s): migraine Other reaction(s): Headache Other reaction(s): migraine   Ciprofloxacin Other (See Comments)    Interstitial cystitis, worsening renal function   Diclofenac Other (See Comments)    Other Reaction(s): hives   Diclofenac Sodium Other (See Comments) and Hives    Other reaction(s): hives Other reaction(s): hives Other reaction(s):  hives   Hydrochlorothiazide  Nausea Only    Chills  Taking at this time lower dose  Chills Taking at this time lower dose  Chills Taking at this time lower dose    Chills Taking at this time lower dose   Other Other (See Comments)    Cat and Dog Dander  Other reaction(s): bladder pain  Other reaction(s): muscle cramps  Other reaction(s): Unknown  Cat and Dog Dander  Other reaction(s): bladder pain Other reaction(s): muscle cramps Other reaction(s): Unknown  Other Reaction(s): Other (See Comments)  Cat and dog dander/reaction is runny nose sneezing   Oxycodone Hcl Other (  See Comments)    Other reaction(s): bladder pain  Other Reaction(s): other   Pantoprazole  Other (See Comments)    Other reaction(s): increased tremors Other reaction(s): increased tremors Other reaction(s): increased tremors  Other Reaction(s): Other (See Comments)  Reaction: Tremor (intolerance)   Pantoprazole  Sodium Other (See Comments)    Other reaction(s): increased tremors  Other Reaction(s): increased tremors   Triamterene Other (See Comments)    Made Tremors worse  Other reaction(s): Other (See Comments)  Made Tremors worse Other reaction(s): Other (See Comments)   Triamterene-Hctz Other (See Comments)    Other reaction(s): muscle cramps   Buprenorphine Other (See Comments)    knocks me out  Other reaction(s): Unknown  Butrans  Other Reaction(s): Drowsiness, Not available, Unknown  knocks me out    knocks me out Other reaction(s): Unknown Butrans Other reaction(s): Unknown Other reaction(s): Unknown   Codeine Nausea Only and Other (See Comments)    Made her feel loopy  Other reaction(s): nausea  Other Reaction(s): GI Intolerance, Other (See Comments), Unknown  Made her feel loopy Other reaction(s): nausea Other reaction(s): nausea  Made her feel loopy Other reaction(s): nausea Other reaction(s): nausea    Made her feel loopy Other reaction(s): nausea Other reaction(s):  nausea   Diclofenac Sodium Hives, Other (See Comments) and Rash    Other reaction(s): hives Other reaction(s): hives Other reaction(s): hives  Other reaction(s): hives Other reaction(s): hives Other reaction(s): hives    Other reaction(s): hives Other reaction(s): hives Other reaction(s): hives   Doxycycline Other (See Comments) and Rash    Other reaction(s): rash  Other reaction(s): rash Other reaction(s): rash Other reaction(s): rash  Other reaction(s): rash Other reaction(s): rash Other reaction(s): rash    Other reaction(s): rash Other reaction(s): rash Other reaction(s): rash   Esomeprazole Other (See Comments)    Jittery, sore throat  Other reaction(s): jittery, sore throat  Other reaction(s): Other  Other Reaction(s): jittery, sore throat  Jittery, sore throat Jittery, sore throat    Jittery, sore throat Other reaction(s): jittery, sore throat Other reaction(s): jittery, sore throat Other reaction(s): Other Other reaction(s): jittery, sore throat  Jittery, sore throat Jittery, sore throat  Jittery, sore throat Other reaction(s): jittery, sore throat Other reaction(s): jittery, sore throat Other reaction(s): Other Other reaction(s): jittery, sore throat    Jittery, sore throat Jittery, sore throat    Jittery, sore throat Other reaction(s): jittery, sore throat Other reaction(s): jittery, sore throat Other reaction(s): Other Other reaction(s): jittery, sore throat  Other Reaction(s): Other (See Comments)  Jittery, sore throat, Jittery, sore throat   Hydrochlorothiazide -Triamterene Hives, Other (See Comments) and Rash    Muscle Cramps  Other reaction(s): muscle cramps  Other reaction(s): Other  Other Reaction(s): muscle cramps  Cramps Cramps    Muscle Cramps  Other reaction(s): muscle cramps Other reaction(s): Other  Other Reaction(s): Other (See Comments)  Cramps, Cramps   Irbesartan  Diarrhea, Other (See Comments) and Rash    Other reaction(s): loose  stools  Other Reaction(s): loose stools  Other reaction(s): loose stools Other reaction(s): loose stools Other reaction(s): loose stools Other reaction(s): loose stools  Other reaction(s): loose stools Other reaction(s): loose stools Other reaction(s): loose stools Other reaction(s): loose stools    Other reaction(s): loose stools Other reaction(s): loose stools Other reaction(s): loose stools Other reaction(s): loose stools   Lactose Other (See Comments)    GI upset   Lactose Intolerance (Gi) Other (See Comments)    GI upset    Lactulose Other (See Comments), Hives and Rash  GI upset   Losartan Hives, Other (See Comments) and Rash    Hot and chills  Other reaction(s): hot and chills  Other reaction(s): flushing  Other Reaction(s): Fever, hot and chills  Hot and chills Hot and chills    Hot and chills Other reaction(s): hot and chills Other reaction(s): hot and chills Other reaction(s): hot and chills Other reaction(s): flushing Other reaction(s): hot and chills  Other Reaction(s): Other (See Comments)  Hot and chills, Hot and chills   Naproxen Other (See Comments), Hives and Rash    Other reaction(s): worsens interstitial cystitis  Other Reaction(s): worsens interstitial cystitis  Worsens IC symptoms    Other reaction(s): worsens interstitial cystitis Other reaction(s): worsens interstitial cystitis Other reaction(s): worsens interstitial cystitis   Nortriptyline Hives and Other (See Comments)    My body doesn't metabolize it anymore. Other reaction(s): Unknown Other reaction(s): Unknown Other reaction(s): Unknown Other reaction(s): Unknown   Nortriptyline Hcl Other (See Comments)    My body doesn't metabolize it anymore.  Other reaction(s): Unknown   Oxycodone Other (See Comments)    Interstitial cystitis  Bladder pain  Other reaction(s): bladder pain  Other reaction(s): other  Other Reaction(s): bladder pain  Interstitial cystitis    Interstitial  cystitis Bladder pain Other reaction(s): bladder pain Other reaction(s): other    Other reaction(s): bladder pain  Other Reaction(s): Other (See Comments)   Oxycontin [Oxycodone Hcl] Other (See Comments)    Interstitial cystitis    Pennsaid [Diclofenac Sodium] Hives   Telmisartan Hives, Other (See Comments) and Rash    headache  Other reaction(s): sinus pain, ha's  Other reaction(s): headache  Other Reaction(s): Headache, sinus pain, ha's  headache headache    headache Other reaction(s): sinus pain, ha's Other reaction(s): sinus pain, ha's Other reaction(s): sinus pain, ha's Other reaction(s): headache Other reaction(s): sinus pain, ha's  Other Reaction(s): Other (See Comments)  headache, headache   Tramadol Itching and Other (See Comments)    sedation  Other reaction(s): sedation/itching  Other Reaction(s): sedation/itching  sedation sedation    sedation Other reaction(s): sedation/itching Other reaction(s): sedation/itching Other reaction(s): sedation/itching Other reaction(s): sedation/itching  sedation, sedation    BP 114/68   Ht 5' 3 (1.6 m)   Wt 144 lb (65.3 kg)   BMI 25.51 kg/m      06/03/2021   11:13 AM  [facility] Exercise  Frequency of aerobic exercise (# of days/week) 0  Average time in minutes 0  Frequency of strengthening activities (# of days/week) 0        No data to display              Objective:  Physical Exam:  Gen: NAD, comfortable in exam room  Left Knee No gross deformity, ecchymoses, swelling. Mild TTP over the lateral portion of the knee, proximal tibia. FROM with normal strength. Negative Ober's Negative ant/post drawers. Negative valgus/varus testing. Negative lachman.  Negative mcmurrays NV intact distally.    Assessment & Plan:  [name] is following up today for left lateral knee pain, which seems to have significantly improved with starting physical therapy. She is also completing a prednisone   taper as noted above in the setting of a PMR flare.  Recommend continuing home exercise program for IT band syndrome and knee stability.  We will see her for follow-up on an as-needed basis.  1. Chronic left knee pain -Continue home PT exercises for IT band syndrome and knee stability -Continue bracing as needed -Follow-up as needed.  Could  consider repeat steroid injection or additional imaging if symptoms worsen.

## 2024-01-13 ENCOUNTER — Other Ambulatory Visit: Payer: Self-pay | Admitting: Physical Medicine and Rehabilitation

## 2024-01-13 MED ORDER — HYDROCODONE-ACETAMINOPHEN 5-325 MG PO TABS
1.0000 | ORAL_TABLET | Freq: Three times a day (TID) | ORAL | 0 refills | Status: DC | PRN
Start: 2024-01-13 — End: 2024-01-17

## 2024-01-14 ENCOUNTER — Encounter (HOSPITAL_BASED_OUTPATIENT_CLINIC_OR_DEPARTMENT_OTHER): Payer: Self-pay | Admitting: Physical Therapy

## 2024-01-14 ENCOUNTER — Ambulatory Visit (HOSPITAL_BASED_OUTPATIENT_CLINIC_OR_DEPARTMENT_OTHER): Admitting: Physical Therapy

## 2024-01-14 DIAGNOSIS — R293 Abnormal posture: Secondary | ICD-10-CM

## 2024-01-14 DIAGNOSIS — M25562 Pain in left knee: Secondary | ICD-10-CM | POA: Diagnosis not present

## 2024-01-14 DIAGNOSIS — M6281 Muscle weakness (generalized): Secondary | ICD-10-CM

## 2024-01-14 DIAGNOSIS — G8929 Other chronic pain: Secondary | ICD-10-CM

## 2024-01-14 DIAGNOSIS — M25561 Pain in right knee: Secondary | ICD-10-CM | POA: Diagnosis not present

## 2024-01-14 DIAGNOSIS — M5459 Other low back pain: Secondary | ICD-10-CM | POA: Diagnosis not present

## 2024-01-14 NOTE — Therapy (Addendum)
 OUTPATIENT PHYSICAL THERAPY THORACOLUMBAR TREATMENT PHYSICAL THERAPY DISCHARGE SUMMARY  Visits from Start of Care: 9  Current functional level related to goals / functional outcomes: Indep   Remaining deficits: Chronic pain   Education / Equipment: Management of condition; HEP   Patient agrees to discharge. Patient goals were partially met. Patient is being discharged due to being pleased with the current functional level.  Addend Ronal Kem) Ziemba MPT 01/19/24 5:18 PM [facility] [address] [place], [place], [address] Phone: [phone]   Fax:  [phone]     Patient Name: [name] MRN: [id number] DOB:1941/06/09, 83 y.o., female Today's Date: 01/14/2024  END OF SESSION:  PT End of Session - 01/14/24 1146     Visit Number 9    Date for PT Re-Evaluation 01/14/24    Authorization Type medicare    PT Start Time 1143    PT Stop Time 1225    PT Time Calculation (min) 42 min    Activity Tolerance Patient tolerated treatment well    Behavior During Therapy [facility] for tasks assessed/performed             Past Medical History:  Diagnosis Date   Adenomatous colon polyp 03/24/2018   Allergy    Arthritis    Cataract    Chronic headaches    Depression    Fibromyalgia    Gallstones    GERD (gastroesophageal reflux disease)    Hyperlipemia    Hypertension    Interstitial cystitis    Lactose intolerance    LBBB (left bundle branch block)    Osteoarthritis    Osteopenia    Pneumonia    Thyroid  disease    Tinnitus    Trigeminal neuralgia    Past Surgical History:  Procedure Laterality Date   CHOLECYSTECTOMY  03/2010   COLONOSCOPY  2009   Dr. 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  Did well after last land session.     PERTINENT HISTORY:  Trigeminal neuralgia chronic with stimulator placed Fibromyalgia Polymyalgia rheumatica  PAIN:  Are you having pain? Yes Pain location:  Hips/ knees 5/10  Trigeminal neuralgiaL: left sided face current 10/10  Pain description: ache, throb; stabbing Aggravating factors: Body: walking; standing Head sharp; running/trickling water sensation Relieving factors: stimulator; meds  PRECAUTIONS: Fall  RED FLAGS: Motor cortex stimulator   WEIGHT BEARING RESTRICTIONS: No  FALLS:  Has patient fallen in last 6 months? No  LIVING ENVIRONMENT: Lives with: lives with their family and lives with their spouse Lives in: House/apartment Stairs: Yes: Internal: 16 steps; on right going up Has following equipment at home: None   OCCUPATION: retired   PLOF:  Independent  PATIENT GOALS: improve strength, return to exercise routine  NEXT MD VISIT: as needed  OBJECTIVE:  Note: Objective measures were completed at Evaluation unless otherwise noted.  DIAGNOSTIC FINDINGS:  X-Ray 10/23 Moderate tricompartmental degenerative changes   PATIENT SURVEYS:  Modified Oswestry To be completed next session/ran out of time 14/50 Mod ODI, 01/14/24:  10/50  COGNITION: Overall cognitive status: Within functional limits for tasks assessed                         SENSATION: WFL   EDEMA: none   POSTURE: forward head; left knee valgus deformity; decreased lumbar lordosis   PALPATION: No tenderness  LUMBAR ROM:   Wfl pain with return to upright from flexion  LOWER EXTREMITY ROM:     wfl  LOWER EXTREMITY MMT:     Completed in sitting HD lb Right eval Left eval Right 01/14/24 Left 01/14/24  Hip flexion 33.8 28.4 35.4 39.5  Hip extension      Hip abduction 19.1 17.2 23.7 17.5  Hip adduction      Hip internal rotation      Hip external rotation      Knee flexion      Knee extension 41.4 40.5    Ankle dorsiflexion      Ankle plantarflexion      Ankle inversion      Ankle eversion       (Blank rows = not tested)   FUNCTIONAL TESTS:  Timed up and go (TUG): 14.59 01/14/24: TUG:12.80  BERG Balance Test      (Eval)     Sit to Stand 3  Standing unsupported 4  Sitting with back unsupported but feet supported 4  Stand to sit  3  Transfers  3  Standing unsupported with eyes closed 4  Standing unsupported feet together 4  From standing position, reach forward with outstretched arm 4  From standing position, pick up object from floor 4  From standing position, turn and look behind over each shoulder 4  Turn 360 4  Standing unsupported, alternately place foot on step 2  Standing unsupported, one foot in front 1  Standing on one leg 1  Total:  45     GAIT: Distance walked: 500 ft Assistive device utilized: None Level of  assistance: Complete Independence Comments: decreased step length and cadence, knees knocking. Left hip rotation  [facility] Adult PT Treatment:  DATE: 01/14/24 Pt seen for aquatic therapy today.  Treatment took place in water 3.5-4.75 ft in depth at the [facility] pool. Temp of water was 91.  Pt entered/exited the pool via stairs and step to pattern with hand rail.  Testing: TUG/ MMT/ mod ODI/ SLS in 19ft6  Exercises - walking forward /backward unsupported  - Hand float carry  with rainbow hand floats - Side Stepping with rainbow hand floats Hand Floats   - Noodle press (hollow noodle) in wide and staggered stance  - Standing 'L' Stretch at [employer]  - tandem gait forward/ backward with hands resting on top of water - UE on hand floats: hip abdct 2 x 10 - Forward Backward Leg Swings - holding wall    - sit to stand on 3rd step x 8 -L stretch at pool wall     01/11/24  Reviewed HEP from MD. LAQ 2x10 bilat Supine SLR 2x10 Supine ITB stretch with strap Seated clams with GTB 2x10 Standing hip abduction 2x10 with UE support Standing HS curls with UE support 2x10  Updated HEP and gave pt a HEP handout.  Pt educated pt in correct form and appropriate frequency.   -Nu-step L4 x1:30, the L 1 for ~20 seconds, but still bothered her so stopped -HR 2x10 -Standing hip abduction 2x10bil -Standing hip extension 2x10bil -sidestepping along railing x3 laps -Sit to stands from elevated plinth x5, elevated slightly higher x5 (some L knee discomfort) -seated clam GTB at thighs 2x10 -seated marching 2x10 -LAQ 5 2x10ea -HSC with RTB 2x10bil    [facility] Adult PT Treatment:                                                DATE: 01/07/24 Pt seen for aquatic therapy today.  Treatment took place in water 3.5-4.75 ft in depth at the [facility] pool. Temp of water was 91.  Pt entered/exited the pool via stairs and step to pattern with hand  rail.   Exercises - Hand float carry   - Side Stepping with Hand Floats   - Braided Sidestepping with Arms Out  - Noodle press - Standing 'L' Stretch at [employer]  - Sit to Stand  - Standing 3-Way Leg Reach with Ankle Floats   - Heel Toe Raises with Counter Support   - Standing Hip Abduction   - Forward Backward Leg Swing - hold wall or noodle    - Walking March   - Single Leg Stance  - Walking Tandem Stance   Pt requires the buoyancy and hydrostatic pressure of water for support, and to offload joints by unweighting joint load by at least 50 % in navel deep water and by at least 75-80% in chest to neck deep water.  Viscosity of the water is needed for resistance of strengthening. Water current perturbations provides challenge to standing balance requiring increased core activation.  TREATMENT  01/05/24 -Nu-step L4 x1:30, the L 1 for ~20 seconds, but still bothered her so stopped -HR 2x10 -Standing hip abduction 2x10bil -Standing hip extension 2x10bil -sidestepping along railing x3 laps -Sit to stands from elevated plinth x5, elevated slightly higher x5 (some L knee discomfort) -seated clam GTB at thighs 2x10 -seated marching 2x10 -LAQ 5 2x10ea -HSC with RTB 2x10bil   TREATMENT  12/29/23 Pt seen for aquatic therapy today.  Treatment took place  in water 3.5-4.75 ft in depth at the [facility] pool. Temp of water was 91.  Pt entered/exited the pool via stairs and step to pattern with hand rail.   -unsupported walking forward/ backward, side stepping x 10 consecutive minutes. -seated on bench: hip add/abd; LAQ -standing ue support wall: df; pf; hip add/abd; HS curl; relaxed squats -return to walking forward, back and side ways x 10 minutes    PATIENT EDUCATION:  Education details:  HEP.   Person educated: Patient Education method: Explanation, demonstration, verbal cues, handout Education comprehension: verbalized understanding, returned demonstration, verbal cues  required  HOME EXERCISE PROGRAM: Aquatic HEP: [id number] from last session    HEP from MD per pt: ITB strecth at wall Supine ITB stretch with strap -sidestepping along wall  Updated HEP: Access Code: [email or web address] URL: [email or web address]  ASSESSMENT:  CLINICAL IMPRESSION: Pt demonstrated improved hip strength; has partially met strength goal.  ODI improved by 8%.  She tolerated session well, with reduction of pain, requiring only occasional cues to complete exercises from HEP. Pt has partially met her goals but is pleased with current level of function.  Pt verbalized readiness to d/c at end of session.   OBJECTIVE IMPAIRMENTS: decreased balance and decreased strength.    ACTIVITY LIMITATIONS: locomotion level   PARTICIPATION LIMITATIONS: community activity and playing tennis   PERSONAL FACTORS: Age and 1-2 comorbidities: OA and trigeminal neuralgia are also affecting patient's functional outcome.    REHAB POTENTIAL: Good   CLINICAL DECISION MAKING: Evolving/moderate complexity   EVALUATION COMPLEXITY: Moderate   GOALS: Goals reviewed with patient? Yes  SHORT TERM GOALS: Target date: 12/30/23 (extended due to delay in session with virus)  Pt will tolerate full aquatic sessions consistently without increase in pain and with improving function to demonstrate good toleration and effectiveness of intervention.  Baseline: Goal status: Met 12/29/23  2.  Pt will complete 10 minutes of continuous amb submerged to demonstrate improvement on endurance Baseline:  Goal status: Met 12/29/23  3.  Pt to report 0/10 pain in knees and back with exercising submerged Baseline:  Goal status: Not met -01/14/24   LONG TERM GOALS: Target date: 01/14/24  Pt to improve on ODI by  13% to demonstrate statistically significant Improvement in function. Baseline: 14/50= 28% at eval, 10/50=20% 01/14/24 Goal status: Not met  2.  Pt will be indep with final HEP's (land and aquatic as  appropriate) for continued management of condition Baseline:  Goal status: MET -01/14/24  3.  Pt will report decrease in general pain (not trigeminal neuralgia) by at least 50% for improved toleration to activity/quality of life and to demonstrate improved management of pain. Baseline:  Goal status: MET -01/14/24  4.  Pt will improve strength in hips by  5lbs to demonstrate improved overall physical function Baseline:  Goal status: Partially met -01/14/24  5.  Pt will perform SLS in 3.6 ft (ue support of hand buoys as needed) x 15s or> improvement in balance ability Baseline:  Goal status: MET - 01/14/24  6.  Pt will improve on Tug test to <or= 12s  to demonstrate improvement in lower extremity function, mobility and decreased fall risk. Baseline: 14.59 at eval; 12.8sec 01/14/24 Goal status: MET - 01/14/24  PLAN:  PT FREQUENCY: 1-2x/week  PT DURATION: 8 weeks  PLANNED INTERVENTIONS: 97164- PT Re-evaluation, 97110-Therapeutic exercises, 97530- Therapeutic activity, 97112- Neuromuscular re-education, 97535- Self Care, [id number]- Manual therapy, [phone]- Gait training, [phone]- Aquatic Therapy, Patient/Family education, Balance training, Stair training, Taping, Dry Needling, DME  instructions, Cryotherapy, and Moist heat.  Delon Aquas, PTA 01/14/24 1:18 PM [facility] [address] [place], [place], [address] Phone: [phone]   Fax:  [phone]

## 2024-01-15 ENCOUNTER — Encounter: Payer: Self-pay | Admitting: Family Medicine

## 2024-01-17 ENCOUNTER — Other Ambulatory Visit: Payer: Self-pay | Admitting: Physical Medicine and Rehabilitation

## 2024-01-17 DIAGNOSIS — G509 Disorder of trigeminal nerve, unspecified: Secondary | ICD-10-CM | POA: Diagnosis not present

## 2024-01-17 DIAGNOSIS — Z9682 Presence of neurostimulator: Secondary | ICD-10-CM | POA: Diagnosis not present

## 2024-01-17 MED ORDER — HYDROCODONE-ACETAMINOPHEN 5-325 MG PO TABS: 1.0000 | ORAL_TABLET | Freq: Three times a day (TID) | ORAL | 0 refills | Status: DC | PRN

## 2024-01-18 ENCOUNTER — Other Ambulatory Visit: Payer: Self-pay | Admitting: Physical Medicine and Rehabilitation

## 2024-01-18 ENCOUNTER — Telehealth: Payer: Self-pay

## 2024-01-18 MED ORDER — HYDROCODONE-ACETAMINOPHEN 5-325 MG PO TABS: 1.0000 | ORAL_TABLET | Freq: Three times a day (TID) | ORAL | 0 refills | Status: DC | PRN

## 2024-02-01 DIAGNOSIS — F4542 Pain disorder with related psychological factors: Secondary | ICD-10-CM | POA: Diagnosis not present

## 2024-02-08 ENCOUNTER — Encounter: Payer: Self-pay | Admitting: Physical Medicine and Rehabilitation

## 2024-02-08 ENCOUNTER — Encounter: Attending: Physical Medicine and Rehabilitation | Admitting: Physical Medicine and Rehabilitation

## 2024-02-08 ENCOUNTER — Other Ambulatory Visit: Payer: Self-pay | Admitting: Physical Medicine and Rehabilitation

## 2024-02-08 VITALS — BP 132/71 | HR 68 | Ht 63.0 in | Wt 149.0 lb

## 2024-02-08 DIAGNOSIS — Z5181 Encounter for therapeutic drug level monitoring: Secondary | ICD-10-CM | POA: Diagnosis not present

## 2024-02-08 DIAGNOSIS — Z79891 Long term (current) use of opiate analgesic: Secondary | ICD-10-CM | POA: Insufficient documentation

## 2024-02-08 DIAGNOSIS — G500 Trigeminal neuralgia: Secondary | ICD-10-CM | POA: Diagnosis not present

## 2024-02-08 DIAGNOSIS — M797 Fibromyalgia: Secondary | ICD-10-CM | POA: Insufficient documentation

## 2024-02-08 DIAGNOSIS — G894 Chronic pain syndrome: Secondary | ICD-10-CM | POA: Diagnosis not present

## 2024-02-08 DIAGNOSIS — K5900 Constipation, unspecified: Secondary | ICD-10-CM | POA: Insufficient documentation

## 2024-02-08 MED ORDER — FENTANYL 50 MCG/HR TD PT72: 1.0000 | MEDICATED_PATCH | TRANSDERMAL | 0 refills | Status: DC

## 2024-02-08 MED ORDER — HYDROCODONE-ACETAMINOPHEN 5-325 MG PO TABS: 1.0000 | ORAL_TABLET | Freq: Three times a day (TID) | ORAL | 0 refills | Status: DC | PRN

## 2024-02-08 NOTE — Progress Notes (Signed)
 Subjective:    Patient ID: [name], female    DOB: October 16, 1940, 83 y.o.   MRN: [id number]  HPI  [name] presents today for f/u of severe trigeminal neuralgia.   1) Severe trigeminal neuralgia: -they have not been able to find anyone who does management of the pain pump- she still wants to get it done -she feels the medication is not helping and that the stimulator is not helping -several times she has turned off the stimulator and that was not enough -she does not get negative side effects from the Fentanyl  patch -she is not doing as well as she had hoped she would after visiting [name] -they had changed some programs and these had really helped while she was in [place] -she stayed with her son in [place]  after her trip to [place] and got to see her great grandson! But after she got home her pain got so much worse. She is not sure if the distraction of family helped her -she and [name] got really tried during the trip- the floors were slick in the shower -today pain is 8/10 -she needs refill of fentanyl  patch -mouth gets dry from gabapentin  -she is planning to see [name] at [facility] again -she went to [facility] 2 weeks ago -Friday or Saturday night it was time for her to have another patch and her husband felt she should go off the patch -she is doing well with hydrocodone  and fentanyl  patch -had been unable to go to the gym when her pain was severe -she is not doing very well -they had to cancel one of the appointments due to snow -the next week [name] who is doing her programming had the flu -she turned off the device for 2 weeks as it was making her pain worse -she had an appointment with [name] on Monday and tried new programs and they seemed to be working better -she is very happy with her new [facility]ician but has not found any helpful settings yet -she turns off the stimulator at night as she can sleep well at night -if she turns off the stimulator  during the day she has 1 hour of relief and then the old pain returns -the last two or three days she has had bad pain -she is filling out pain diaries -she was able to cook Thanksgiving dinner but felt a lot of pain the next day -she is doing ok -she likes her new physician at [facility]. He works a lot with the [employer] rep because the rep is just for back pain -he takes pictures and asks for the location to be adjusted -at [facility] she is just given one program and then it stops working.  -Saw [name] 8 weeks ago and the new programs worked but then it stops working when she returns home -she is seeing neurosurgeon [name] on Thursday  [name] researched red light therapy -she went to see the doctor in [place]  and was only been able to spend 5 minutes with her -they did about 15 minutes of adjustments and the new programs seemed to hurt more than they help -has had stimulator turned off since last night -was able to enjoy her birthday -she did well for 3 weeks but recently pain has been around 8-10 -when pain is very severe it makes her eye hurt -the way her stimulator is set it makes her nose hurt She is going back to [facility] on April 10th -she is managing  with the gabapentin  and had to take 2 extra yesterday and it did not make her sleepier  -pain fluctuates  -she went to [place] last week with her children.  -she keeps a journal of when she changes the program and what her pain level is in her teeth, eyes, and cheek. She put in not to use the teeth program -she said if they could hit the nerve between the two teeth that would be helpful -they have her an 11 and 12 in that region,  -she cannot get the right level of stimulation. Before she left she decreased it by 50%. Now she is on a cheat program and the lowest amount. It is helping the teeth but hurting the eye.  -[name] had a baby on 11/14 and she is not coming back until January.  -she talked to the representative -last night she  turned off the stimulator -she slept almost day today and did not want to wake up for supper which was rare for her -she is having a difficult time weaning off the hydrocodone .  -she has been off of it in two days -she is expecting a return call from [name] team soon to discuss wean off fentanyl  -drove up on Tuesday and on Wednesday was supposed to have an appointment with pain psychologist. When they got there they were told that appointment had been changed to virtual. The next day they had a more sophisticated MRI. [name] did moving in MRI so MRI was not as helpful. The next day they saw [name] and discussed several procedural options and then went back to the motor stimulator- that is scheduled on October 16th.  -she recently went on vacation and had to spend the majority of the vacation in bed due to the severity of her pain -she stopped using the intranasal ketamine because although it helped initially, it appeared to make her pain worse later -she asks if she can wean off some medication as she had a recent appointment with [name] and was told she may have a lot of postoperative pain if she is taking so much pain medication. She would still like to do this and also experiences sedation from her medication. She asks how she would go about weaning her medications -she asks if she should wean off the gabapentin  since this makes her so sleepy She asks if she should restart Topamax . -she was encouraged by her phone visit with [name] and plans to pursue surgery. She is going to [facility] next week for Brain MRI, neuropsych eval, and to discuss the risks and benefits of the procedure with [name] -pain was so severe in the past that she expressed suicidality to her husband [name].  -she feels that none of her medications are helpful but she knows that when she tries to wean them her pain worsens.  -she asks about refill of the compounding cream -her husband asks about referral to ketamine  clinic for depression rather than pain -she went back to the dentist because it was urting between two teeth and he said there is nothing wrong with those teeth and it is the trigeminal nerve When she took a shower that usually seems to help with but was migrating around her face.  -appointment with neurology in July -[name] cancelled the zoom call in April but this was rescheduled to July.  -she finds that only the gabapentin  helps -she is not sure if the fentanyl  helps When she is feeling really  bad she tries to take another one but she gets very sleepy and dizzy.  -she is taking 8 a day of 100mg  per day- she takes two gabapentin  and tylenol  and hydrocodone  in the morning. She takes another two at breakfast around 11, another 2 at lunch around 1:30pm.  -she had a recent root canal and pain has become much more severe after that -her pain is in the two teeth next to where she had the root canal -Oragel heps for a bout an hour.  -she tried increasing her Gabapentin  to 300mg  TID but she could not tolerate this. It made her too sleepy.  -she contacted Dr. Darlis and he recommended she pause wean of Fentanyl  patch and I agree with this -she took last patch last night. She still has 12.5mcg patch available -her husband asks whether she should take a higher dose of gabapentin  -the fentanyl  patch makes her very sleepy and so allows her to sleep. It has also made her dizzier.  -the 50mcg fentanyl  patch did not provide more relief- she would like to wean off. Weaned to 25, then 12.40mcg, then off this week -referral was to [facility] and not to Dr. Kirke Solders she states and they need a new referral send to Dr. Solders -she is using the compound cream and this helps- she uses this twice per day -capsaicin  made it burn  -lamotrigine  makes her too sleepy and dizzy to come down the stairs by herself and she does not feel comfortable going to the shower -left eye more open than usual. -she  does not feel she is getting relief from the fentanyl  patch -she feels that the procedure at [facility] is a last resort.  -Given darkening of skin and worsening eye lid closure, MRI brain stat obtained and shows new cyst that could be post-surgical. Recommended she go to ED given worsening dizziness and facial swelling as well. ED physician discussed case with NSGY and neurology and discharged home recommending follow-up with Dr. Dartha and Dr. Tarri. She was given IV Fentanyl  in the ED which helped and discharged on 12mcg Fentanyl  patch, which has not helped.  -discussed increasing Fentanyl  patch to 25mcg since she has not experienced any negative side effects, no respiratory depression, and her husband is agreeable. I have sent in new script to start Saturday, but insurance required prior auth, which was approved.  -tonight would be the third tay of the 25mcg Fentanyl  patch -she is still taking the Gabapentin  -she feels that when she lays down she is spinning.  -eye continues to appear more closed.  -Husband has called Dr. Sunnie and Dr. Teddi office to scheduled follow-up appointments and has appointments with both tomorrow.  -Will run out of Fentanyl  patch by Sunday -pain is worsening and nothing is providing relief -she asks whether she should go to ED -she has also noted dizziness and is not sure if this is from being in the MRI or from the Savella .  -morphine  was not effective and caused itching, sarna lotion did help wit this.  -hydrocodone  has been most effective thus far -nucynta  was not effective but she is not sure whether she gave it a fair trial -she has month appointments scheduled with Fidela and f/u with me in Feb -she used the Norco last night and this morning with good benefit and has started to feel the pain return this afternoon -she did find benefit from the compound cream- she continues to use this.  -She continues to have  a sharp shooting pain doing around  her eyelid and the tip teeth and in the top of her head.  -has a radiofrequency ablation treatment that helped but not completely -she is not having as much sharp knife-like pin like in her eye Her top teeth in her left law have been hurting really badly.  -she now takes the baclofen  with the gabapentin  and they seem to work well together.  -she had surgical procedure and feels worse -yesterday she took a half a tablet but this morning she took a whole one. She does fine with the hydrocodone  but a whole one makes her too sleepy.  -she is taking 2 100mg  Gabapentin  three times per day but it makes her dizzy and sleepy so she cannot funciton -the cold worsens her pain.  -The IV fentanyl  did help with the pain.  -her husband feels she should get one more dose of the 25mg  fentanyl . -she did get one week benefit from the steroid injection.  -she is doing terribly -she is miserable -she feels depressed due to the pain -she has considered taking the whole bottle of hydrocodone  because because she can't take this pain anymore.  -she started taking the Gabapentin  300mg  TID.  -she does find relief from the hydrocodone  -the pain is constant -she wonders if it is inflammation.  -she has not tried turmeric. Her son-in-law gave her a bottle and she couldn't stomach it.   2) Fibromyalgia: -She has been almost nonfunctional due to the severity of her pain. It continues to be very severe.  -She would like to wean off the Gabapentin  as she feels at risk of falls -she is taking Topamax  BID. She felt this was helpful but made her unsteady on her feet.  -still using gabapentin  and this is helping.  -she feels so fatigued that she can only have a shower every other day.   3) Osteoporosis: -husband asks whether she should get a Prolia  shot.   4) Dizziness: -head was swimming -she felt like she was going to fall.  -worsening -feels like she is spinning, not the room  -She tried the Phenytoin  for 2 days  and did not feel benefit so stopped it.   -she feels the gabapentin  aggravates this and she would love to wean off but knows it helps her.   -She feels very sleepy during the day.   -She has never tried Topirmate before.   5) Sore throat -developed stopping lamotrigine  since not helping.   Prior history:  Her pain continues to be in her left eye, forehead. She has not tried any topicals for pain. She uses capsaicin  for her back and knees.   Since last visit nothing we have tried has helped with her pain. She has tried to decrease her Gabapentin  dose but then her pain increased so she has resumed taking 4 Gabapentin  per day.   Her husband asks whether he should contact her surgeon regarding whether nerve may have become unclamped.   Discussed prolotherapy, trigger point injections, Baclofen , Clonazepam, Phenytoin  as other options we could try,=.  Pain continues to be excruciating and she states she is not sure how much longer she can live like this.   Prior history: She has having some nausea, lightheadedness and felt this may be related to her Gabapentin . She did not take any yesterday and then felt more sever pain in the evening. She also gets electrical shooting across her jaw.   She does not feel that she has cluster headaches  as the pain has been so constant. She feels that she is getting worse. The oxygen made her feel relaxed but it is not helping.   The Sarna lotion does help with the itching in her head.   She was willing to try the Amitriptyline  but it makes her too sleepy in the morning.   Her teeth and left eye incredibly hurt.   She feels that the Gabapentin  has been making her dizziness.   She asks about biofeedback.   The pain is 7/10 on average, similar to last time.   When the pain is severe it can make her depressed.    6) Macular degeneration:  Has been following with ophthalmology  7) Family illness: -she lost two nephews recently  8) Knee  osteoarthritis: -had a cortisone shot and this really helped  9) Constipation: -she gets this from the hydrocdone -she has a BM every day and half- there is straining -[name] says diet is poor -she tries to eat a lot of fruit for breakfast  Pain Inventory Average pain 10 Pain Right Now 10 My pain is constant, tingling, sharp, dull, stabbing, aching, numbness, burning  In the last 24 hours, has pain interfered with the following? General activity 10 Relation with others 5 Enjoyment of life 8  What TIME of day is your pain at its worst? Morning, daytime, evening, night Sleep (in general) Good  Pain is worst with  cold air, bending, damp air  Pain improves with heat, medication   Relief with medication is  4   Family History  Problem Relation Age of Onset   Heart disease Mother    Heart disease Father    Stroke Father    Stroke Sister    Asthma Daughter    Fuller cancer Neg Hx    Social History   Socioeconomic History   Marital status: Married    Spouse name: Not on file   Number of children: 2   Years of education: college   Highest education level: Not on file  Occupational History   Occupation: housewife    Employer: RETIRED  Tobacco Use   Smoking status: Never   Smokeless tobacco: Never  Vaping Use   Vaping status: Never Used  Substance and Sexual Activity   Alcohol use: No    Alcohol/week: 0.0 standard drinks of alcohol   Drug use: No   Sexual activity: Not on file  Other Topics Concern   Not on file  Social History Narrative   1 cup of Jasmine tea daily    Social Drivers of Health   Financial Resource Strain: Low Risk  (01/16/2024)   Received from [facility]   Overall Financial Resource Strain (CARDIA)    Difficulty of Paying Living Expenses: Not hard at all  Food Insecurity: No Food Insecurity (01/16/2024)   Received from [facility]   Hunger Vital Sign    Within the past 12 months, you worried that your  food would run out before you got the money to buy more.: Never true    Within the past 12 months, the food you bought just didn't last and you didn't have money to get more.: Never true  Transportation Needs: No Transportation Needs (01/16/2024)   Received from [facility] - Transportation    In the past 12 months, has lack of transportation kept you from medical appointments or from getting medications?: No    Lack of Transportation (Non-Medical): No  Physical Activity: Inactive (11/13/2019)   Received from [facility] visits prior to 08/22/2022.   Exercise Vital Sign    On average, how many days per week do you engage in moderate to strenuous exercise (like a brisk walk)?: 0 days    On average, how many minutes do you engage in exercise at this level?: 0 min  Stress: No Stress Concern Present (11/13/2019)   Received from [facility] visits prior to 08/22/2022.   [employer] of Occupational Health - Occupational Stress Questionnaire    Feeling of Stress : Not at all  Social Connections: Socially Integrated (11/13/2019)   Received from [facility] visits prior to 08/22/2022.   Social Connection and Isolation Panel    In a typical week, how many times do you talk on the phone with family, friends, or neighbors?: More than three times a week    How often do you get together with friends or relatives?: More than three times a week    How often do you attend church or religious services?: 1 to 4 times per year    Do you belong to any clubs or organizations such as church groups, unions, fraternal or athletic groups, or school groups?: Yes    How often do you attend meetings of the clubs or organizations you belong to?: 1 to 4 times per year    Are you married, widowed, divorced, separated, never married, or living with a partner?: Married   Past Surgical History:  Procedure Laterality Date    CHOLECYSTECTOMY  03/2010   COLONOSCOPY  2009   Dr. Author    CYSTOSCOPY     Multiple Cystoscopies with stents or biopsy    ESOPHAGOGASTRODUODENOSCOPY  10/2013   EYE SURGERY     bilateral cataract removal   Gamma knife radiation  01/2018   for trigeminal neuralgia   REFRACTIVE SURGERY Bilateral 2022   RHIZOTOMY Left 04/28/2021   Procedure: Left Percutaneous trigeminal balloon rhizotomy;  Surgeon: Cheryle Debby LABOR, MD;  Location: [facility] OR;  Service: Neurosurgery;  Laterality: Left;   TONSILLECTOMY     Past Surgical History:  Procedure Laterality Date   CHOLECYSTECTOMY  03/2010   COLONOSCOPY  2009   Dr. 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   Allergy    Arthritis    Cataract    Chronic headaches    Depression    Fibromyalgia    Gallstones    GERD (gastroesophageal reflux disease)    Hyperlipemia    Hypertension    Interstitial cystitis    Lactose intolerance    LBBB (left bundle branch block)    Osteoarthritis    Osteopenia    Pneumonia    Thyroid  disease    Tinnitus    Trigeminal neuralgia    Ht 5' 3 (1.6 m)   Wt 149 lb (67.6 kg)   BMI 26.39 kg/m   Opioid Risk Score:  Fall Risk Score:  `1  Depression screen [facility] 2/9     02/08/2024   11:35 AM 11/19/2023   10:10 AM 10/05/2023    9:18 AM 05/25/2023   11:27 AM 02/23/2023   11:21 AM 12/28/2022    1:35 PM 10/27/2022    2:05 PM  Depression screen PHQ 2/9  Decreased Interest 0 0 0 0 0 0 0  Down, Depressed, Hopeless  0 0 0 0 0 0 0  PHQ - 2 Score 0 0 0 0 0 0 0  Altered sleeping  0       Tired, decreased energy  0       Feeling bad or failure about yourself   0       Trouble concentrating  0       Moving slowly or fidgety/restless  0       Suicidal thoughts  0       PHQ-9 Score  0         Review of Systems  Constitutional: Negative.   HENT: Negative.    Eyes:  Positive for visual disturbance.  Respiratory: Negative.    Cardiovascular: Negative.   Gastrointestinal: Negative.   Endocrine: Negative.   Genitourinary: Negative.   Musculoskeletal: Negative.        Pain in left side of face  Skin: Negative.   Allergic/Immunologic: Negative.   Neurological:  Positive for dizziness, tremors, light-headedness and numbness.       Tingling   Hematological: Negative.   Psychiatric/Behavioral: Negative.    All other systems reviewed and are negative.      Objective:   Physical Exam Gen: no distress, normal appearing HEENT: oral mucosa pink and moist, NCAT Cardio: Reg rate Chest: normal effort, normal rate of breathing Abd: soft, non-distended Ext: no edema Psych: pleasant, normal affect Skin: intact Neuro: Alert and oriented x3, left eye drop and facial droop- stable . Assessment & Plan:  Mrs. Mcgann is an 83 year old woman who presents for f/u of left sided trigeminal neuralgia and dizziness.    1) Sensory deafferation pain, left sided trigeminal neuralgia vs cluster headaches (she does have lacrimation and runny nose on that side). -discussed that seeing the water on the beach caused dizziness -increase fentanyl  patch to 50mcg -discussed that she has been happy with her new provider at [facility] -discussed that she is following up with [name] at [facility] -refilled fentanyl , discussed that she tried stopping this and pain was very severe -avoid proceed foods, eggs, gluten, dairy -recommended eating furits and vegetables -continue hydrocodone , discussed that she feel she needs either  a higher dose or frequency, discussed that she has become somewhat more tolerant to the medication than she was 15 years ago -discussed that gabapentin  used to make her to lean to the left but now it makes her lean to the right -discussed that the gabapentin  makes her sleepy -discussed that she feels that the hydrocodone  is more effective for her than the gabapentin  -discussed [name] recommendation for an intrathecal pain pump, discussed that she has a trial scheduled for the pump, discussed that she is considering this at [facility] -encouraged discussing with with the [facility] team risks and benefits of this procedure -discussed that she is doing Zoom calls with a psychiatrist -d/c narcan  and naltrexone, discussed that she uses the hydrocodone  sparingly.  -discussed that she had new programs installed on Monday -discussed that she is doing well with the hydrocodone  -discussed pruritus with the fentanyl  patch -continue  following with pain psychologist, discussed that I think it is phenomenal that she is doing this -refilled hydrocodone  and fentanyl  -discussed rating her pain for the programming of electrical stimulation   -discussed mechanism of action of low dose naltrexone as an opioid receptor antagonist which stimulates your body's production of its own natural endogenous opioids, helping to decrease pain. Discussed that it can also decrease T cell response and thus be helpful in decreasing inflammation, and symptoms of brain fog, fatigue, anxiety, depression, and allergies. Discussed that this medication needs to be compounded at a compounding pharmacy and can more expensive. Discussed that I usually start at 1mg  and if this is not providing enough relief then I titrate upward on a monthly basis.    -discussed that savella  caused hallucinations -discussed that we tried lyrica  and we cannot recall but she had some side effect -discussed that topamax  did not help and made her feel  worse -discussed that carbamazpeine did not help -discussed that she is happy that she does not have to return to [place] as those trips caused her a lot of fatigue -discussed that pain was bad after she cooked for Thanksgiving -discussed her follow-up with [name] -discussed the negative experience when she last went to [place]  -discussed that next available appointment was on June 24th or 26th, but they were able to move this up to May 22nd or 24th -discussed that she keeps a journal with the setting with the level of her pain.  -continue Diclofenac 3%, Gabapentin  5%, Lidocaine  5%, Menthol 1% compounded at [facility]:  apply 1-2 pumps three to four times per day as needed, discussed that this dose help -discussed that she has had to turn off the stimulator more since she has [facility] -discussed response to her stimulator -discussed doubling the gabapentin  when pain is severe Increase low dose naltrexone to 3mg  HS -discussed her response to the stimulator She does have a history of migraines.  -Discussed that if she has to be in bed all day due to her pain it may be beneficial for her husband to give her at least one hydrocodone  per day to enable her to function -provided refill of Fentanyl  patch as she will be leaving for her surgery in [place] soon and her husband plans to stay there for some time in case she has any surgical complications.  -discussed her follow-up with pain psychology.  -recommended weaning off Fentanyl , discussed stopping fentanyl  because she is on 12.5mg  today but she had tried this a couple of weeks ago and pain was so severe that she needed patch to be replaced.  -discussed that she is taking hydrocodone  4 tablets per day, and recommend to decrease this to 3 tabs per day for three days and then to keep decreasing the tab by once per day.  -discussed that skipping the morning dose of hydrocodone  will at least allow her to sleep -recommended  trying Frankincense and Myrrh essential oils mixed in coconut oil and applied to area of pain -recommended restarting Topamax  -called [facility] to prescribe compounding gel refill -continue Gabapentin  100mg  up to 8 times a day, discussed her current schedule of takin -discussed using Oragel multiple times per ay since this helps for an hour, use the topical cream intermittently in between -continue follow-up with [facility] to set up an appointment for her with them. Unfortunately Dr. Zulema has retired but was able to set her up with Dr. Binnie who specializes in migraines and  trigeminal neuralgia on July 25th. Let patient and husband know. They will mail her their address.   -commended her for calling Dr. Darlis as well to let him know how she is feeling -recommended taking 1 hydrocodone  now for the severe pain, and discussed that the Fentanyl  patch with take 12-24 hours to kick in -discussed with April the neurology referral and she did specify Dr. Kirke Solders and spoke with the [facility] department -discussed CBD oil, her son bought her some of this and she felt that it greatly helped  Recommend topical CBD oil- discussed its benefits in reducing inflammation, pain, insomnia, and anxiety, but she stopped because effect wore of.  -Discussed that CBD oil differs from marijuana in that it does not contain THC- the substance that causes euphoria.  -Discussed that it is made from the hemp plant.  -It has been used for thousands of years -preliminary research suggests that if may be able to shrink cancerous tumors, stop plaque formation in Alzheimer's Disease, and slow the progress of brain disease from concussions.  -Additional benefits that have been demonstrated in studies include improved nausea, indigestion, and brain health, and reduced seizures.  -In a survey 92% of patients who tried medical cannabis felt it improved symptoms such as chronic pain, arthritis,  migraines, and cancer.   -Provided with a pain relief journal and discussed that it contains foods and lifestyle tips to naturally help to improve pain. Discussed that these lifestyle strategies are also very good for health unlike some medications which can have negative side effects. Discussed that the act of keeping a journal can be therapeutic and helpful to realize patterns what helps to trigger and alleviate pain.   -discussed with Dr. Saul trigeminal deafferation pain, discussed the difference between this and trigeminal neuralgia with patient and husband, discussed motor cortex stimulation. -discussed whether or not to continue fentanyl  patch, husband does feel that the IV fentanyl   -encouraged following with Dr. Sweet/Dr. Cleotilde at [facility] to try procedure recommended by D. Ostegard.  -discussed mechanism of action of low dose naltrexone as an opioid receptor antagonist which stimulates your body's production of its own natural endogenous opioids, helping to decrease pain. Discussed that it can also decrease T cell response and thus be helpful in decreasing inflammation, and symptoms of brain fog, fatigue, anxiety, depression, and allergies. Discussed that this medication needs to be compounded at a compounding pharmacy and can more expensive. Discussed that I usually start at 1mg  and if this is not providing enough relief then I titrate upward on a monthly basis.   -stop lamotrigine  as it is making her dizzy without benefit -reviewed patient's medical notes -discussed her progress with getting a referral to [facility] -recommended going to ED given darkening of skin, worsening eye closure, finding of cyst, dizziness to receive prompt neurosurgical eval regarding if surgery is necessary, and also for pain management given worsening pain refractory to all treatments and resulting suicidality. Reviewed ED doc notes- he discussed with on-call neurology and NSGY and determined that patient  could be discharged with outpatient follow-up. IV fentanyl  was administered with relief to patient. She was discharged on fentanyl  patch which has not provided relief. She is getting 12mcg- no side effects- discussed increasing to 25mcg and her husband is in agreement. Prior auth completed and approved. Discussed with husband starting patch on Satruday after she has worn 12mcg patch for 72 hours -encouraged follow-up with Dr. Saul, husband let me know appointment has been set up  1/18.  -recommended follow-up with Dr. 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  -Discussed that turmeric  -discussed steroid taper. -continue savella , husband feels like it is helping with her suicidal thoughts. Increase to 25mg .  Discontinued hydrocodone   -discuss mouth guard with dentist.  -d/c topamax  -she does have another procedure scheduled. -refilled Norco -follow-up monthly with Fidela and with me in February, placed on waitlist for earlier appointment with me, advised phone visits/MyChart are a great way to communicate in the interim -discussed that she can take an additional one or two tylenol  for breakthrough pain -recommended checking CMP for liver enzymes with next labs -prescribed NAC 600mg  BID to protect the liver while taking tylenol , discussed its health benefits for mitochondrial function -d/c Baclofen   -f/u with Dr. Saul and Dr. Darlis 1/18 -discussed retrying carbamazepine  to see if it is effective for her. It is $89 for her, so we discussed trying Lamictal  instead.  -encouraged anti-inflammatory diet.  -she had sinusitis that was viral and was treated with antibiotics. This was  in the early 90s. The symptoms resolved after 2 months. She uses to get sinus infections and saline rinse.  -She has had multiple root canals.  -Discontinue 100% oxygen via facemask as this was not helpful.  -Discussed that unfortunately Sprint PNS is not indicated for craniofacial pain. -Amitriptyline  and Lyrica  did not help.  -Continue Cymbalta  60mg .  -Topiramate  helped but caused dizziness.  -Provided with hand out with information regarding trigeminal neuralgia -Failed Penytoin.  -trial lidocaine  patch -She would like to try biofeedback. -reviewed neurology note.  -referred to Dr. Kirke Solders.  -will try trigger point injections next visit.  -Continue Ketamine 10%, Baclofen  2%, Cyclobenzaprine 2%, Ketoprofen 10%, Gabapentin  6%, Bupivacaine 1%, Amitryptiline 5% to apply to painful areas- using 3-4 times per day.  -Can consider Meloxicam if Phenytoin  does not help.  -Discussed Qutenza  as an option for neuropathic pain control. Discussed that this is a capsaicin  patch, stronger than capsaicin  cream. Discussed that it is currently approved for diabetic peripheral neuropathy and post-herpetic neuralgia, but that it has also shown benefit in treating other forms of neuropathy. Provided patient with link to site to learn more about the patch: [email or web address].qutenza .[email or web address]/. Discussed that the patch would be placed in office and benefits usually last 3 months. Discussed that unintended exposure to capsaicin  can cause severe irritation of eyes, mucous membranes, respiratory tract, and skin, but that Qutenza  is a local treatment and does not have the systemic side effects of other nerve medications. Discussed that there may be pain, itching, erythema, and decreased sensory function associated with the application of Qutenza . Side effects usually subside within 1 week. A cold pack of analgesic medications can help with these side effects. Blood pressure can also be increased due to pain associated with  administration of the patch.  -she cut down on her gabapentin  due to her dizziness    Turmeric to reduce inflammation--can be used in cooking or taken as a supplement.  Benefits of turmeric:  -Highly anti-inflammatory  -Increases antioxidants  -Improves memory, attention, brain disease  -Lowers risk of heart disease  -May help prevent cancer  -Decreases pain  -Alleviates depression  -Delays aging and decreases risk of chronic disease  -Consume with  black pepper to increase absorption    Turmeric Milk Recipe:  1 cup milk  1 tsp turmeric  1 tsp cinnamon  1 tsp grated ginger (optional)  Black pepper (boosts the anti-inflammatory properties of turmeric).  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  -she is a good metabolizer of amitriptyline  and has tolerated nortrypilline in the past, but without much benefit.    3) CKD: encouraged 6-8 glasses of water per day. She states that she had AKI with Ibuprofen in the past. Discussed checking Cr level next visit if we are considering Meloxicam -Discussed that Nucynta , and other pain medications, may stay in her bloodstream longer given her CKD   4) Hypernatremia: encouraged hydration   5) General health and pain: provided with list of healthy foods that are both nutritious and fight pain.   6) Depressed: Amitriptyline  did help with this but she  could not tolerate the drowsiness. Encouraged focusing on the factors that she can control, such as anti-inflammatory diet.   7) Fibromyalgia: Discussed goal to wean steroids prior to surgery. Continue 1mg  daily. She is on that intermittently. She started this again about a month ago. She has been on this about a year. -avoid gluten/dairy -continue gabapentin  -recommended eating a lot of fruits and vegetables -recommended avoiding eggs, gluten, dairy, processed foods Continue Savella  -discussed benefits of cold water showers.  -continue gabapentin .  Continue fentanyl  and hydrocodone  -avoid processed foods -avoid gluten, dairy, and eggs -eat fruits, vegetables, spices, and herbs -Discussed current symptoms of pain and history of pain.  -Discussed benefits of exercise in reducing pain. -Discussed following foods that may reduce pain: 1) Ginger (especially studied for arthritis)- reduce leukotriene production to decrease inflammation 2) Blueberries- high in phytonutrients that decrease inflammation 3) Salmon- marine omega-3s reduce joint swelling and pain 4) Pumpkin seeds- reduce inflammation 5) dark chocolate- reduces inflammation 6) turmeric- reduces inflammation 7) tart cherries - reduce pain and stiffness 8) extra virgin olive oil - its compound olecanthal helps to block prostaglandins  9) chili peppers- can be eaten or applied topically via capsaicin  10) mint- helpful for headache, muscle aches, joint pain, and itching 11) garlic- reduces inflammation 12) Green tea- reduces inflammation and oxidative stress, helps with weight loss, may reduce the risk of cancer, recommend Double [employer] of Tea daily  Link to further information on diet for chronic pain: [email or web address].com/   8) Dizziness: -discussed that this could be from the combination of gabapentin  and baclofen .  -discussed that all the  medications we are trying for pain may worsen the dizziness  9) Constipation:  -Provided list of following foods that help with constipation and highlighted a few: 1) prunes- contain high amounts of fiber.  2) apples- has a form of dietary fiber called pectin that accelerates stool movement and increases beneficial gut bacteria 3) pears- in addition to fiber, also high in fructose and sorbitol which have laxative effect 4) figs- contain an enzyme ficin which  helps to speed colonic transit 5) kiwis- contain an enzyme actinidin that improves gut motility and reduces constipation 6) oranges- rich in pectin (like apples) 7) grapefruits- contain a flavanol naringenin which has a laxative effect 8) vegetables- rich in fiber and also great sources of folate, vitamin C, and K 9) artichoke- high in inulin, prebiotic great for the microbiome 10) chicory- increases stool frequency and softness (can be added to coffee) 11) rhubarb- laxative effect 12) sweet potato- high fiber 13) beans, peas, and lentils- contain both soluble and insoluble fiber 14) chia seeds- improves intestinal health and gut flora 15) flaxseeds- laxative effect 16) whole grain rye bread- high in fiber 17) oat bran- high in soluble and insoluble fiber 18) kefir- softens stools -recommended to try at least one of these foods every day.  -drink 6-8 glasses of water per day -walk regularly, especially after meals.   10) Osteopenia - was tapered off the prednisone  -discussed that currently her pain may be more of a risk to her than osteopenia given her suicidal ideation from her pain -discussed risk of fall with the medications that make her dizziness.   11) Daytime sleepiness -discussed that this is secondary to her medications.  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  -discussed red light therapy  15) HTN: -discussed clonidine  patch could potentially help with HTN and pain.  -BP is 170/90 today.  -Advised checking BP daily at home and logging results to bring into follow-up appointment with PCP and myself. -Reviewed BP meds today.  -Advised regarding healthy foods that can help lower blood pressure and provided with a list: 1) citrus foods- high in vitamins and minerals 2) salmon and other fatty fish - reduces inflammation and oxylipins 3) swiss chard (leafy green)- high level of nitrates 4) pumpkin seeds- one of the best natural sources of magnesium 5) Beans and lentils- high in fiber, magnesium, and potassium 6) Berries- high in flavonoids 7) Amaranth (whole grain, can be cooked similarly to rice and oats)- high in magnesium and fiber 8) Pistachios- even more effective at reducing BP than other nuts 9) Carrots- high in phenolic compounds that relax blood vessels and reduce inflammation 10) Celery- contain phthalides that relax tissues of arterial walls 11) Tomatoes- can also improve cholesterol and reduce risk of heart disease 12) Broccoli- good source of magnesium, calcium , and potassium 13) Greek yogurt: high in potassium and calcium  14) Herbs and spices: Celery seed, cilantro, saffron, lemongrass, black cumin, ginseng, cinnamon, cardamom, sweet basil, and ginger 15) Chia and flax seeds- also help to lower cholesterol and blood sugar 16) Beets- high levels of nitrates that relax blood vessels  17) spinach and bananas- high in potassium  -Provided lise of supplements that can help with hypertension:  1) magnesium: one high quality brand is Bi[employer]emizers since it contains all 7 types of magnesium, otherwise over the counter magnesium gluconate 400mg  is a good option 2) B vitamins 3) vitamin D  4) potassium 5) CoQ10 6) L-arginine 7) Vitamin C 8) Beetroot -Educated that goal BP is  120/80. -Made goal to incorporate some of the above foods into diet.    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  -discussed that she had weaned off the steroids and had been doing fine off the steroids -discussed that aquatic therapy can help -  discussed that she should encourage follow-up with rheumatology to assess for giant cell arteritis -[facility] -continue 2mg  steroids  16. Sciatica: -consider red light therapy -discussed that this has improved -discussed aquatherapy -discussed that the spinal injection helped temporarily -discussed that she has been taking 4mg  of low dose naltrexone -discussed that she used some hydrocodone  she had left over from the past.  -discussed lumbosacral orthosis, advised using for activities that involve bending/twisting/walking on uneven terrain -discussed that it would probably be better to repeat steroid injection than to continue hydrocodone  -advised to use lidocaine  patch -continue capsaicin  cream  17. Family illness: -discussed that she lost 2 nephews recently  80. Depression:  -continue cymbalta  -encouraged follow-up with psychiatry   19. Dizziness: -discussed that this has improved  20. 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   22. Constipation: Recommended eating an apple every day Recommended eating prunes every  day

## 2024-02-08 NOTE — Addendum Note (Signed)
 Addended by: Elisha Mcgruder M on: 02/08/2024 12:32 PM   Modules accepted: Orders

## 2024-02-08 NOTE — Patient Instructions (Signed)
 Avoid gluten/dairy Coconut yogurt

## 2024-02-09 DIAGNOSIS — M2550 Pain in unspecified joint: Secondary | ICD-10-CM | POA: Diagnosis not present

## 2024-02-09 DIAGNOSIS — M25562 Pain in left knee: Secondary | ICD-10-CM | POA: Diagnosis not present

## 2024-02-09 DIAGNOSIS — M150 Primary generalized (osteo)arthritis: Secondary | ICD-10-CM | POA: Diagnosis not present

## 2024-02-09 DIAGNOSIS — M797 Fibromyalgia: Secondary | ICD-10-CM | POA: Diagnosis not present

## 2024-02-09 DIAGNOSIS — M25559 Pain in unspecified hip: Secondary | ICD-10-CM | POA: Diagnosis not present

## 2024-02-09 DIAGNOSIS — M353 Polymyalgia rheumatica: Secondary | ICD-10-CM | POA: Diagnosis not present

## 2024-02-09 DIAGNOSIS — M549 Dorsalgia, unspecified: Secondary | ICD-10-CM | POA: Diagnosis not present

## 2024-02-09 DIAGNOSIS — M79673 Pain in unspecified foot: Secondary | ICD-10-CM | POA: Diagnosis not present

## 2024-02-09 DIAGNOSIS — M170 Bilateral primary osteoarthritis of knee: Secondary | ICD-10-CM | POA: Diagnosis not present

## 2024-02-09 DIAGNOSIS — M79643 Pain in unspecified hand: Secondary | ICD-10-CM | POA: Diagnosis not present

## 2024-02-09 DIAGNOSIS — M810 Age-related osteoporosis without current pathological fracture: Secondary | ICD-10-CM | POA: Diagnosis not present

## 2024-02-10 ENCOUNTER — Telehealth: Payer: Self-pay

## 2024-02-10 NOTE — Telephone Encounter (Signed)
 [facility] called to clarify prescription for Fentanyl , said was double the dose from last month and wanted to make sure it was sent correctly.

## 2024-02-11 DIAGNOSIS — N958 Other specified menopausal and perimenopausal disorders: Secondary | ICD-10-CM | POA: Diagnosis not present

## 2024-02-11 DIAGNOSIS — M8588 Other specified disorders of bone density and structure, other site: Secondary | ICD-10-CM | POA: Diagnosis not present

## 2024-02-12 ENCOUNTER — Other Ambulatory Visit: Payer: Self-pay | Admitting: Physical Medicine and Rehabilitation

## 2024-02-14 DIAGNOSIS — Z9682 Presence of neurostimulator: Secondary | ICD-10-CM | POA: Diagnosis not present

## 2024-02-14 DIAGNOSIS — G509 Disorder of trigeminal nerve, unspecified: Secondary | ICD-10-CM | POA: Diagnosis not present

## 2024-02-14 LAB — DRUG TOX MONITOR 1 W/CONF, ORAL FLD
Amphetamines: NEGATIVE ng/mL (ref <10)
Barbiturates: NEGATIVE ng/mL (ref <10)
Benzodiazepines: NEGATIVE ng/mL (ref <0.50)
Buprenorphine: NEGATIVE ng/mL (ref <0.10)
Cocaine: NEGATIVE ng/mL (ref <5.0)
Codeine: NEGATIVE ng/mL (ref <2.5)
Dihydrocodeine: 11.7 ng/mL — ABNORMAL HIGH (ref <2.5)
Fentanyl: 2.61 ng/mL — ABNORMAL HIGH (ref <0.10)
Fentanyl: POSITIVE ng/mL — AB (ref <0.10)
Heroin Metabolite: NEGATIVE ng/mL (ref <1.0)
Hydrocodone: 107.6 ng/mL — ABNORMAL HIGH (ref <2.5)
Hydromorphone: NEGATIVE ng/mL (ref <2.5)
MARIJUANA: NEGATIVE ng/mL (ref <2.5)
MDMA: NEGATIVE ng/mL (ref <10)
Meprobamate: NEGATIVE ng/mL (ref <2.5)
Methadone: NEGATIVE ng/mL (ref <5.0)
Morphine: NEGATIVE ng/mL (ref <2.5)
Nicotine Metabolite: NEGATIVE ng/mL (ref <5.0)
Norhydrocodone: 3.6 ng/mL — ABNORMAL HIGH (ref <2.5)
Noroxycodone: NEGATIVE ng/mL (ref <2.5)
Opiates: POSITIVE ng/mL — AB (ref <2.5)
Oxycodone: NEGATIVE ng/mL (ref <2.5)
Oxymorphone: NEGATIVE ng/mL (ref <2.5)
Phencyclidine: NEGATIVE ng/mL (ref <10)
Tapentadol: NEGATIVE ng/mL (ref <5.0)
Tramadol: NEGATIVE ng/mL (ref <5.0)
Zolpidem: NEGATIVE ng/mL (ref <5.0)

## 2024-02-14 LAB — DRUG TOX ALC METAB W/CON, ORAL FLD: Alcohol Metabolite: NEGATIVE ng/mL (ref <25)

## 2024-02-16 NOTE — Telephone Encounter (Signed)
 Notified pharmacy.

## 2024-02-22 DIAGNOSIS — H353123 Nonexudative age-related macular degeneration, left eye, advanced atrophic without subfoveal involvement: Secondary | ICD-10-CM | POA: Diagnosis not present

## 2024-02-28 ENCOUNTER — Other Ambulatory Visit: Payer: Self-pay | Admitting: Physical Medicine and Rehabilitation

## 2024-02-28 ENCOUNTER — Encounter: Payer: Self-pay | Admitting: *Deleted

## 2024-02-28 MED ORDER — FENTANYL 25 MCG/HR TD PT72: 1.0000 | MEDICATED_PATCH | TRANSDERMAL | 0 refills | Status: DC

## 2024-02-28 NOTE — Telephone Encounter (Signed)
 Opened in error

## 2024-02-28 NOTE — Progress Notes (Unsigned)
 Subjective:    Patient ID: [name], female    DOB: 1940/10/21, 83 y.o.   MRN: [id number]  HPI  An audio/video tele-health visit is felt to be the most appropriate encounter for this patient at this time. This is a follow up tele-visit via phone. The patient is at home. MD is at office. Prior to scheduling this appointment, our staff discussed the limitations of evaluation and management by telemedicine and the availability of in-person appointments. The patient expressed understanding and agreed to proceed.   [name] presents today for f/u of severe trigeminal neuralgia.   1) Severe trigeminal neuralgia: -they have not been able to find anyone who does management of the pain pump- she still wants to get it done -she feels the medication is not helping and that the stimulator is not helping -several times she has turned off the stimulator and that was not enough -she does not get negative side effects from the Fentanyl  patch -she is not doing as well as she had hoped she would after visiting Dr. Colon -they had changed some programs and these had really helped while she was in [place] -she stayed with her son in [place]  after her trip to [place] and got to see her great grandson! But after she got home her pain got so much worse. She is not sure if the distraction of family helped her -she and [name] got really tried during the trip- the floors were slick in the shower -today pain is 8/10 -she needs refill of fentanyl  patch -mouth gets dry from gabapentin  -she is planning to see Dr. Colon at [facility] again -she went to [facility] 2 weeks ago -Friday or Saturday night it was time for her to have another patch and her husband felt she should go off the patch -she is doing well with hydrocodone  and fentanyl  patch -had been unable to go to the gym when her pain was severe -she is not doing very well -they had to cancel one of the appointments due to snow -the next week  [name] had the flu -she turned off the device for 2 weeks as it was making her pain worse -she had an appointment with [name] on Monday and tried new programs and they seemed to be working better -she is very happy with her new [facility]ician but has not found any helpful settings yet -she turns off the stimulator at night as she can sleep well at night -if she turns off the stimulator during the day she has 1 hour of relief and then the old pain returns -the last two or three days she has had bad pain -she is filling out pain diaries -she was able to cook Thanksgiving dinner but felt a lot of pain the next day -she is doing ok -she likes her new physician at [facility]. He works a lot with the [employer] rep because the rep is just for back pain -he takes pictures and asks for the location to be adjusted -at [facility] she is just given one program and then it stops working.  -Saw Dr. Colon 8 weeks ago and the new programs worked but then it stops working when she returns home -she is seeing neurosurgeon [name] on Thursday  [name] researched red light therapy -she went to see the doctor in [place]  and was only been able to spend 5 minutes with her -they did about 15 minutes of adjustments and the new programs  seemed to hurt more than they help -has had stimulator turned off since last night -was able to enjoy her birthday -she did well for 3 weeks but recently pain has been around 8-10 -when pain is very severe it makes her eye hurt -the way her stimulator is set it makes her nose hurt She is going back to [name] on April 10th -she is managing with the gabapentin  and had to take 2 extra yesterday and it did not make her sleepier  -pain fluctuates  -she went to [place] last week with her children.  -she keeps a journal of when she changes the program and what her pain level is in her teeth, eyes, and cheek. She put in not to use the teeth program -she said if they  could hit the nerve between the two teeth that would be helpful -they have her an 11 and 12 in that region,  -she cannot get the right level of stimulation. Before she left she decreased it by 50%. Now she is on a cheat program and the lowest amount. It is helping the teeth but hurting the eye.  -Dr. Colon had a baby on 11/14 and she is not coming back until January.  -she talked to the representative -last night she turned off the stimulator -she slept almost day today and did not want to wake up for supper which was rare for her -she is having a difficult time weaning off the hydrocodone .  -she has been off of it in two days -she is expecting a return call from [name] team soon to discuss wean off fentanyl  -drove up on Tuesday and on Wednesday was supposed to have an appointment with pain psychologist. When they got there they were told that appointment had been changed to virtual. The next day they had a more sophisticated MRI. [name] did moving in MRI so MRI was not as helpful. The next day they saw Dr. Colon and discussed several procedural options and then went back to the motor stimulator- that is scheduled on October 16th.  -she recently went on vacation and had to spend the majority of the vacation in bed due to the severity of her pain -she stopped using the intranasal ketamine because although it helped initially, it appeared to make her pain worse later -she asks if she can wean off some medication as she had a recent appointment with Dr. Colon and was told she may have a lot of postoperative pain if she is taking so much pain medication. She would still like to do this and also experiences sedation from her medication. She asks how she would go about weaning her medications -she asks if she should wean off the gabapentin  since this makes her so sleepy She asks if she should restart Topamax . -she was encouraged by her phone visit with Dr. Colon and plans to pursue surgery. She is going  to [facility] next week for Brain MRI, neuropsych eval, and to discuss the risks and benefits of the procedure with Dr. Colon -pain was so severe in the past that she expressed suicidality to her husband Zell.  -she feels that none of her medications are helpful but she knows that when she tries to wean them her pain worsens.  -she asks about refill of the compounding cream -her husband asks about referral to ketamine clinic for depression rather than pain -she went back to the dentist because it was urting between two teeth and he said there is nothing wrong  with those teeth and it is the trigeminal nerve When she took a shower that usually seems to help with but was migrating around her face.  -appointment with neurology in July -Dr. Colon cancelled the zoom call in April but this was rescheduled to July.  -she finds that only the gabapentin  helps -she is not sure if the fentanyl  helps When she is feeling really bad she tries to take another one but she gets very sleepy and dizzy.  -she is taking 8 a day of 100mg  per day- she takes two gabapentin  and tylenol  and hydrocodone  in the morning. She takes another two at breakfast around 11, another 2 at lunch around 1:30pm.  -she had a recent root canal and pain has become much more severe after that -her pain is in the two teeth next to where she had the root canal -Oragel heps for a bout an hour.  -she tried increasing her Gabapentin  to 300mg  TID but she could not tolerate this. It made her too sleepy.  -she contacted Dr. Darlis and he recommended she pause wean of Fentanyl  patch and I agree with this -she took last patch last night. She still has 12.5mcg patch available -her husband asks whether she should take a higher dose of gabapentin  -the fentanyl  patch makes her very sleepy and so allows her to sleep. It has also made her dizzier.  -the 50mcg fentanyl  patch did not provide more relief- she would like to wean off. Weaned to 25, then 12.98mcg,  then off this week -referral was to [facility] and not to Dr. Kirke Solders she states and they need a new referral send to Dr. Solders -she is using the compound cream and this helps- she uses this twice per day -capsaicin  made it burn  -lamotrigine  makes her too sleepy and dizzy to come down the stairs by herself and she does not feel comfortable going to the shower -left eye more open than usual. -she does not feel she is getting relief from the fentanyl  patch -she feels that the procedure at [facility] is a last resort.  -Given darkening of skin and worsening eye lid closure, MRI brain stat obtained and shows new cyst that could be post-surgical. Recommended she go to ED given worsening dizziness and facial swelling as well. ED physician discussed case with NSGY and neurology and discharged home recommending follow-up with Dr. Dartha and Dr. Tarri. She was given IV Fentanyl  in the ED which helped and discharged on 12mcg Fentanyl  patch, which has not helped.  -discussed increasing Fentanyl  patch to 25mcg since she has not experienced any negative side effects, no respiratory depression, and her husband is agreeable. I have sent in new script to start Saturday, but insurance required prior auth, which was approved.  -tonight would be the third tay of the 25mcg Fentanyl  patch -she is still taking the Gabapentin  -she feels that when she lays down she is spinning.  -eye continues to appear more closed.  -Husband has called Dr. Sunnie and Dr. Teddi office to scheduled follow-up appointments and has appointments with both tomorrow.  -Will run out of Fentanyl  patch by Sunday -pain is worsening and nothing is providing relief -she asks whether she should go to ED -she has also noted dizziness and is not sure if this is from being in the MRI or from the Savella .  -morphine  was not effective and caused itching, sarna lotion did help wit this.  -hydrocodone  has been most effective  thus far -nucynta   was not effective but she is not sure whether she gave it a fair trial -she has month appointments scheduled with Fidela and f/u with me in Feb -she used the Norco last night and this morning with good benefit and has started to feel the pain return this afternoon -she did find benefit from the compound cream- she continues to use this.  -She continues to have a sharp shooting pain doing around her eyelid and the tip teeth and in the top of her head.  -has a radiofrequency ablation treatment that helped but not completely -she is not having as much sharp knife-like pin like in her eye Her top teeth in her left law have been hurting really badly.  -she now takes the baclofen  with the gabapentin  and they seem to work well together.  -she had surgical procedure and feels worse -yesterday she took a half a tablet but this morning she took a whole one. She does fine with the hydrocodone  but a whole one makes her too sleepy.  -she is taking 2 100mg  Gabapentin  three times per day but it makes her dizzy and sleepy so she cannot funciton -the cold worsens her pain.  -The IV fentanyl  did help with the pain.  -her husband feels she should get one more dose of the 25mg  fentanyl . -she did get one week benefit from the steroid injection.  -she is doing terribly -she is miserable -she feels depressed due to the pain -she has considered taking the whole bottle of hydrocodone  because because she can't take this pain anymore.  -she started taking the Gabapentin  300mg  TID.  -she does find relief from the hydrocodone  -the pain is constant -she wonders if it is inflammation.  -she has not tried turmeric. Her son-in-law gave her a bottle and she couldn't stomach it.   2) Fibromyalgia: -She has been almost nonfunctional due to the severity of her pain. It continues to be very severe.  -She would like to wean off the Gabapentin  as she feels at risk of falls -she is taking Topamax  BID. She  felt this was helpful but made her unsteady on her feet.  -still using gabapentin  and this is helping.  -she feels so fatigued that she can only have a shower every other day.   3) Osteoporosis: -husband asks whether she should get a Prolia  shot.   4) Dizziness: -head was swimming -she felt like she was going to fall.  -worsening -feels like she is spinning, not the room  -She tried the Phenytoin  for 2 days and did not feel benefit so stopped it.   -she feels the gabapentin  aggravates this and she would love to wean off but knows it helps her.   -She feels very sleepy during the day.   -She has never tried Topirmate before.   5) Sore throat -developed stopping lamotrigine  since not helping.   Prior history:  Her pain continues to be in her left eye, forehead. She has not tried any topicals for pain. She uses capsaicin  for her back and knees.   Since last visit nothing we have tried has helped with her pain. She has tried to decrease her Gabapentin  dose but then her pain increased so she has resumed taking 4 Gabapentin  per day.   Her husband asks whether he should contact her surgeon regarding whether nerve may have become unclamped.   Discussed prolotherapy, trigger point injections, Baclofen , Clonazepam, Phenytoin  as other options we could try,=.  Pain continues to be excruciating and  she states she is not sure how much longer she can live like this.   Prior history: She has having some nausea, lightheadedness and felt this may be related to her Gabapentin . She did not take any yesterday and then felt more sever pain in the evening. She also gets electrical shooting across her jaw.   She does not feel that she has cluster headaches as the pain has been so constant. She feels that she is getting worse. The oxygen made her feel relaxed but it is not helping.   The Sarna lotion does help with the itching in her head.   She was willing to try the Amitriptyline  but it makes her  too sleepy in the morning.   Her teeth and left eye incredibly hurt.   She feels that the Gabapentin  has been making her dizziness.   She asks about biofeedback.   The pain is 7/10 on average, similar to last time.   When the pain is severe it can make her depressed.    6) Macular degeneration:  Has been following with ophthalmology  7) Family illness: -she lost two nephews recently  8) Knee osteoarthritis: -had a cortisone shot and this really helped  9) Constipation: -she gets this from the hydrocdone -she has a BM every day and half- there is straining -[name] says diet is poor -she tries to eat a lot of fruit for breakfast  Pain Inventory Average pain 10 Pain Right Now 10 My pain is constant, tingling, sharp, dull, stabbing, aching, numbness, burning  In the last 24 hours, has pain interfered with the following? General activity 10 Relation with others 5 Enjoyment of life 8  What TIME of day is your pain at its worst? Morning, daytime, evening, night Sleep (in general) Good  Pain is worst with  cold air, bending, damp air  Pain improves with heat, medication   Relief with medication is  4   Family History  Problem Relation Age of Onset   Heart disease Mother    Heart disease Father    Stroke Father    Stroke Sister    Asthma Daughter    Colon cancer Neg Hx    Social History   Socioeconomic History   Marital status: Married    Spouse name: Not on file   Number of children: 2   Years of education: college   Highest education level: Not on file  Occupational History   Occupation: housewife    Employer: RETIRED  Tobacco Use   Smoking status: Never   Smokeless tobacco: Never  Vaping Use   Vaping status: Never Used  Substance and Sexual Activity   Alcohol use: No    Alcohol/week: 0.0 standard drinks of alcohol   Drug use: No   Sexual activity: Not on file  Other Topics Concern   Not on file  Social History Narrative   1 cup of Jasmine tea  daily    Social Drivers of Health   Financial Resource Strain: Low Risk  (01/16/2024)   Received from [facility]   Overall Financial Resource Strain (CARDIA)    Difficulty of Paying Living Expenses: Not hard at all  Food Insecurity: No Food Insecurity (01/16/2024)   Received from [facility]   Hunger Vital Sign    Within the past 12 months, you worried that your food would run out before you got the money to buy more.: Never true    Within the past 12 months,  the food you bought just didn't last and you didn't have money to get more.: Never true  Transportation Needs: No Transportation Needs (01/16/2024)   Received from [facility] - Transportation    In the past 12 months, has lack of transportation kept you from medical appointments or from getting medications?: No    Lack of Transportation (Non-Medical): No  Physical Activity: Inactive (11/13/2019)   Received from [facility] visits prior to 08/22/2022.   Exercise Vital Sign    On average, how many days per week do you engage in moderate to strenuous exercise (like a brisk walk)?: 0 days    On average, how many minutes do you engage in exercise at this level?: 0 min  Stress: No Stress Concern Present (11/13/2019)   Received from [facility] visits prior to 08/22/2022.   [employer] of Occupational Health - Occupational Stress Questionnaire    Feeling of Stress : Not at all  Social Connections: Socially Integrated (11/13/2019)   Received from [facility] visits prior to 08/22/2022.   Social Connection and Isolation Panel    In a typical week, how many times do you talk on the phone with family, friends, or neighbors?: More than three times a week    How often do you get together with friends or relatives?: More than three times a week    How often do you attend church or religious services?: 1 to 4 times per  year    Do you belong to any clubs or organizations such as church groups, unions, fraternal or athletic groups, or school groups?: Yes    How often do you attend meetings of the clubs or organizations you belong to?: 1 to 4 times per year    Are you married, widowed, divorced, separated, never married, or living with a partner?: Married   Past Surgical History:  Procedure Laterality Date   CHOLECYSTECTOMY  03/2010   COLONOSCOPY  2009   Dr. Author    CYSTOSCOPY     Multiple Cystoscopies with stents or biopsy    ESOPHAGOGASTRODUODENOSCOPY  10/2013   EYE SURGERY     bilateral cataract removal   Gamma knife radiation  01/2018   for trigeminal neuralgia   REFRACTIVE SURGERY Bilateral 2022   RHIZOTOMY Left 04/28/2021   Procedure: Left Percutaneous trigeminal balloon rhizotomy;  Surgeon: Cheryle Debby LABOR, MD;  Location: [facility] OR;  Service: Neurosurgery;  Laterality: Left;   TONSILLECTOMY     Past Surgical History:  Procedure Laterality Date   CHOLECYSTECTOMY  03/2010   COLONOSCOPY  2009   Dr. Author    CYSTOSCOPY     Multiple Cystoscopies with stents or biopsy    ESOPHAGOGASTRODUODENOSCOPY  10/2013   EYE SURGERY     bilateral cataract removal   Gamma knife radiation  01/2018   for trigeminal neuralgia   REFRACTIVE SURGERY Bilateral 2022   RHIZOTOMY Left 04/28/2021   Procedure: Left Percutaneous trigeminal balloon rhizotomy;  Surgeon: Cheryle Debby LABOR, MD;  Location: [facility] OR;  Service: Neurosurgery;  Laterality: Left;   TONSILLECTOMY     Past Medical History:  Diagnosis Date   Adenomatous colon polyp 03/24/2018   Allergy    Arthritis    Cataract    Chronic headaches    Depression    Fibromyalgia    Gallstones    GERD (gastroesophageal reflux disease)    Hyperlipemia    Hypertension  Interstitial cystitis    Lactose intolerance    LBBB (left bundle branch block)    Osteoarthritis    Osteopenia    Pneumonia    Thyroid  disease    Tinnitus     Trigeminal neuralgia    There were no vitals taken for this visit.  Opioid Risk Score:   Fall Risk Score:  `1  Depression screen [facility] 2/9     02/08/2024   11:35 AM 11/19/2023   10:10 AM 10/05/2023    9:18 AM 05/25/2023   11:27 AM 02/23/2023   11:21 AM 12/28/2022    1:35 PM 10/27/2022    2:05 PM  Depression screen PHQ 2/9  Decreased Interest 0 0 0 0 0 0 0  Down, Depressed, Hopeless 0 0 0 0 0 0 0  PHQ - 2 Score 0 0 0 0 0 0 0  Altered sleeping  0       Tired, decreased energy  0       Feeling bad or failure about yourself   0       Trouble concentrating  0       Moving slowly or fidgety/restless  0       Suicidal thoughts  0       PHQ-9 Score  0         Review of Systems  Constitutional: Negative.   HENT: Negative.    Eyes:  Positive for visual disturbance.  Respiratory: Negative.    Cardiovascular: Negative.   Gastrointestinal: Negative.   Endocrine: Negative.   Genitourinary: Negative.   Musculoskeletal: Negative.        Pain in left side of face  Skin: Negative.   Allergic/Immunologic: Negative.   Neurological:  Positive for dizziness, tremors, light-headedness and numbness.       Tingling   Hematological: Negative.   Psychiatric/Behavioral: Negative.    All other systems reviewed and are negative.      Objective:   Physical Exam Gen: no distress, normal appearing HEENT: oral mucosa pink and moist, NCAT Cardio: Reg rate Chest: normal effort, normal rate of breathing Abd: soft, non-distended Ext: no edema Psych: pleasant, normal affect Skin: intact Neuro: Alert and oriented x3, left eye drop and facial droop- stable . Assessment & Plan:  Mrs. Zumbro is an 83 year old woman who presents for f/u of left sided trigeminal neuralgia and dizziness.    1) Sensory deafferation pain, left sided trigeminal neuralgia vs cluster headaches (she does have lacrimation and runny nose on that side). -discussed that seeing the water on the beach caused dizziness -increase  fentanyl  patch to 50mcg -discussed that she has been happy with her new provider at [facility] -discussed that she is following up with Dr. Colon at [facility] -refilled fentanyl , discussed that she tried stopping this and pain was very severe -avoid proceed foods, eggs, gluten, dairy -recommended eating furits and vegetables -continue hydrocodone , discussed that she feel she needs either a higher dose or frequency, discussed that she has become somewhat more tolerant to the medication than she was 15 years ago -discussed that gabapentin  used to make her to lean to the left but now it makes her lean to the right -discussed that the gabapentin  makes her sleepy -discussed that she feels that the hydrocodone  is more effective for her than the gabapentin  -discussed [name] recommendation for an intrathecal pain pump, discussed that she has a trial scheduled for the pump, discussed that she is considering this at [facility] -encouraged discussing  with with the [facility] team risks and benefits of this procedure -discussed that she is doing Zoom calls with a psychiatrist -d/c narcan  and naltrexone, discussed that she uses the hydrocodone  sparingly.  -discussed that she had new programs installed on Monday -discussed that she is doing well with the hydrocodone  -discussed pruritus with the fentanyl  patch -continue following with pain psychologist, discussed that I think it is phenomenal that she is doing this -refilled hydrocodone  and fentanyl  -discussed rating her pain for the programming of electrical stimulation   -discussed mechanism of action of low dose naltrexone as an opioid receptor antagonist which stimulates your body's production of its own natural endogenous opioids, helping to decrease pain. Discussed that it can also decrease T cell response and thus be helpful in decreasing inflammation, and symptoms of brain fog, fatigue, anxiety, depression, and allergies. Discussed that this medication  needs to be compounded at a compounding pharmacy and can more expensive. Discussed that I usually start at 1mg  and if this is not providing enough relief then I titrate upward on a monthly basis.    -discussed that savella  caused hallucinations -discussed that we tried lyrica  and we cannot recall but she had some side effect -discussed that topamax  did not help and made her feel worse -discussed that carbamazpeine did not help -discussed that she is happy that she does not have to return to [place] as those trips caused her a lot of fatigue -discussed that pain was bad after she cooked for Thanksgiving -discussed her follow-up with [name] -discussed the negative experience when she last went to [place]  -discussed that next available appointment was on June 24th or 26th, but they were able to move this up to May 22nd or 24th -discussed that she keeps a journal with the setting with the level of her pain.  -continue Diclofenac 3%, Gabapentin  5%, Lidocaine  5%, Menthol 1% compounded at [facility]:  apply 1-2 pumps three to four times per day as needed, discussed that this dose help -discussed that she has had to turn off the stimulator more since she has established care at [facility] -discussed response to her stimulator -discussed doubling the gabapentin  when pain is severe Increase low dose naltrexone to 3mg  HS -discussed her response to the stimulator She does have a history of migraines.  -Discussed that if she has to be in bed all day due to her pain it may be beneficial for her husband to give her at least one hydrocodone  per day to enable her to function -provided refill of Fentanyl  patch as she will be leaving for her surgery in [place] soon and her husband plans to stay there for some time in case she has any surgical complications.  -discussed her follow-up with pain psychology.  -recommended weaning off Fentanyl , discussed stopping fentanyl  because she is on 12.5mg  today but she had  tried this a couple of weeks ago and pain was so severe that she needed patch to be replaced.  -discussed that she is taking hydrocodone  4 tablets per day, and recommend to decrease this to 3 tabs per day for three days and then to keep decreasing the tab by once per day.  -discussed that skipping the morning dose of hydrocodone  will at least allow her to sleep -recommended trying Frankincense and Myrrh essential oils mixed in coconut oil and applied to area of pain -recommended restarting Topamax  -called [facility] to prescribe compounding gel refill -continue Gabapentin  100mg  up to 8 times a day, discussed her  current schedule of takin -discussed using Oragel multiple times per ay since this helps for an hour, use the topical cream intermittently in between -continue follow-up with [facility] to set up an appointment for her with them. Unfortunately Dr. Zulema has retired but was able to set her up with Dr. Binnie who specializes in migraines and trigeminal neuralgia on July 25th. Let patient and husband know. They will mail her their address.   -commended her for calling Dr. Darlis as well to let him know how she is feeling -recommended taking 1 hydrocodone  now for the severe pain, and discussed that the Fentanyl  patch with take 12-24 hours to kick in -discussed with April the neurology referral and she did specify Dr. Kirke Zulema and spoke with the [facility] Neurology department -discussed CBD oil, her son bought her some of this and she felt that it greatly helped  Recommend topical CBD oil- discussed its benefits in reducing inflammation, pain, insomnia, and anxiety, but she stopped because effect wore of.  -Discussed that CBD oil differs from marijuana in that it does not contain THC- the substance that causes euphoria.  -Discussed that it is made from the hemp plant.  -It has been used for thousands of years -preliminary research suggests that if may be able to shrink  cancerous tumors, stop plaque formation in Alzheimer's Disease, and slow the progress of brain disease from concussions.  -Additional benefits that have been demonstrated in studies include improved nausea, indigestion, and brain health, and reduced seizures.  -In a survey 92% of patients who tried medical cannabis felt it improved symptoms such as chronic pain, arthritis, migraines, and cancer.   -Provided with a pain relief journal and discussed that it contains foods and lifestyle tips to naturally help to improve pain. Discussed that these lifestyle strategies are also very good for health unlike some medications which can have negative side effects. Discussed that the act of keeping a journal can be therapeutic and helpful to realize patterns what helps to trigger and alleviate pain.   -discussed with Dr. Saul trigeminal deafferation pain, discussed the difference between this and trigeminal neuralgia with patient and husband, discussed motor cortex stimulation. -discussed whether or not to continue fentanyl  patch, husband does feel that the IV fentanyl   -encouraged following with Dr. Sweet/Dr. Cleotilde at [facility] to try procedure recommended by D. Ostegard.  -discussed mechanism of action of low dose naltrexone as an opioid receptor antagonist which stimulates your body's production of its own natural endogenous opioids, helping to decrease pain. Discussed that it can also decrease T cell response and thus be helpful in decreasing inflammation, and symptoms of brain fog, fatigue, anxiety, depression, and allergies. Discussed that this medication needs to be compounded at a compounding pharmacy and can more expensive. Discussed that I usually start at 1mg  and if this is not providing enough relief then I titrate upward on a monthly basis.   -stop lamotrigine  as it is making her dizzy without benefit -reviewed patient's medical notes -discussed her progress with getting a referral to [facility] -recommended going to ED given darkening of skin, worsening eye closure, finding of cyst, dizziness to receive prompt neurosurgical eval regarding if surgery is necessary, and also for pain management given worsening pain refractory to all treatments and resulting suicidality. Reviewed ED doc notes- he discussed with on-call neurology and NSGY and determined that patient could be discharged with outpatient follow-up. IV fentanyl  was administered with relief to patient. She was discharged on fentanyl   patch which has not provided relief. She is getting 12mcg- no side effects- discussed increasing to 25mcg and her husband is in agreement. Prior auth completed and approved. Discussed with husband starting patch on Satruday after she has worn 12mcg patch for 72 hours -encouraged follow-up with Dr. Saul, husband let me know appointment has been set up 1/18.  -recommended follow-up with Dr. 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  -Discussed that turmeric  -discussed steroid taper. -continue savella , husband feels like it is helping with her suicidal thoughts. Increase to 25mg .  Discontinued hydrocodone   -discuss mouth guard with dentist.  -d/c topamax  -she does have another procedure scheduled. -refilled Norco -follow-up monthly with Fidela and with me in February, placed on waitlist for earlier appointment with me, advised phone visits/MyChart are a great way to communicate in the interim -discussed that she can take an additional one or two tylenol  for breakthrough pain -recommended checking CMP for liver enzymes with next labs -prescribed NAC 600mg  BID to protect the liver  while taking tylenol , discussed its health benefits for mitochondrial function -d/c Baclofen   -f/u with Dr. Saul and Dr. Darlis 1/18 -discussed retrying carbamazepine  to see if it is effective for her. It is $89 for her, so we discussed trying Lamictal  instead.  -encouraged anti-inflammatory diet.  -she had sinusitis that was viral and was treated with antibiotics. This was in the early 90s. The symptoms resolved after 2 months. She uses to get sinus infections and saline rinse.  -She has had multiple root canals.  -Discontinue 100% oxygen via facemask as this was not helpful.  -Discussed that unfortunately Sprint PNS is not indicated for craniofacial pain. -Amitriptyline  and Lyrica  did not help.  -Continue Cymbalta  60mg .  -Topiramate  helped but caused dizziness.  -Provided with hand out with information regarding trigeminal neuralgia -Failed Penytoin.  -trial lidocaine  patch -She would like to try biofeedback. -reviewed neurology note.  -referred to Dr. Kirke Solders.  -will try trigger point injections next visit.  -Continue Ketamine 10%, Baclofen  2%, Cyclobenzaprine 2%, Ketoprofen 10%, Gabapentin  6%, Bupivacaine 1%, Amitryptiline 5% to apply to painful areas- using 3-4 times per day.  -Can consider Meloxicam if Phenytoin  does not help.  -Discussed Qutenza  as an option for neuropathic pain control. Discussed that this is a capsaicin  patch, stronger than capsaicin  cream. Discussed that it is currently approved for diabetic peripheral neuropathy and post-herpetic neuralgia, but that it has also shown benefit in treating other forms of neuropathy. Provided patient with link to site to learn more about the patch: [email or web address].[email or web address] .[email or web address]/. Discussed that the patch would be placed in office and benefits usually last 3 months. Discussed that unintended exposure to capsaicin  can cause severe irritation of eyes, mucous membranes, respiratory tract, and skin, but that Qutenza  is a local  treatment and does not have the systemic side effects of other nerve medications. Discussed that there may be pain, itching, erythema, and decreased sensory function associated with the application of Qutenza . Side effects usually subside within 1 week. A cold pack of analgesic medications can help with these side effects. Blood pressure can also be increased due to pain associated with administration of the patch.  -she cut down on her gabapentin  due to her dizziness  Turmeric to reduce inflammation--can be used in cooking or taken as a supplement.  Benefits of turmeric:  -Highly anti-inflammatory  -Increases antioxidants  -Improves memory, attention, brain disease  -Lowers risk of heart disease  -May help prevent cancer  -Decreases pain  -Alleviates depression  -Delays aging and decreases risk of chronic disease  -Consume with black pepper to increase absorption    Turmeric Milk Recipe:  1 cup milk  1 tsp turmeric  1 tsp cinnamon  1 tsp grated ginger (optional)  Black pepper (boosts the anti-inflammatory properties of turmeric).  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  -she is a good metabolizer of amitriptyline  and has tolerated nortrypilline in the past, but without much benefit.    3) CKD: encouraged 6-8 glasses of water per day. She states that she had AKI with Ibuprofen in the  past. Discussed checking Cr level next visit if we are considering Meloxicam -Discussed that Nucynta , and other pain medications, may stay in her bloodstream longer given her CKD   4) Hypernatremia: encouraged hydration   5) General health and pain: provided with list of healthy foods that are both nutritious and fight pain.   6) Depressed: Amitriptyline  did help with this but she could not tolerate the drowsiness. Encouraged focusing on the factors that she can control, such as anti-inflammatory diet.   7) Fibromyalgia: Discussed goal to wean steroids prior to surgery. Continue 1mg  daily. She is on that intermittently. She started this again about a month ago. She has been on this about a year. -avoid gluten/dairy -continue gabapentin  -recommended eating a lot of fruits and vegetables -recommended avoiding eggs, gluten, dairy, processed foods Continue Savella  -discussed benefits of cold water showers.  -continue gabapentin .  Continue fentanyl  and hydrocodone  -avoid processed foods -avoid gluten, dairy, and eggs -eat fruits, vegetables, spices, and herbs -Discussed current symptoms of pain and history of pain.  -Discussed benefits of exercise in reducing pain. -Discussed following foods that may reduce pain: 1) Ginger (especially studied for arthritis)- reduce leukotriene production to decrease inflammation 2) Blueberries- high in phytonutrients that decrease inflammation 3) Salmon- marine omega-3s reduce joint swelling and pain 4) Pumpkin seeds- reduce inflammation 5) dark chocolate- reduces inflammation 6) turmeric- reduces inflammation 7) tart cherries - reduce pain and stiffness 8) extra virgin olive oil - its compound olecanthal helps to block prostaglandins  9) chili peppers- can be eaten or applied topically via capsaicin  10) mint- helpful for headache, muscle aches, joint pain, and itching 11) garlic- reduces inflammation 12) Green tea- reduces inflammation and oxidative  stress, helps with weight loss, may reduce the risk of cancer, recommend Double [employer] [place] of Tea daily  Link to further information on diet for chronic pain: [email or web address]   8) Dizziness: -discussed that this could be from the combination of gabapentin  and baclofen .  -discussed that all the medications we are trying for pain may worsen the dizziness  9) Constipation:  -Provided list of following foods  that help with constipation and highlighted a few: 1) prunes- contain high amounts of fiber.  2) apples- has a form of dietary fiber called pectin that accelerates stool movement and increases beneficial gut bacteria 3) pears- in addition to fiber, also high in fructose and sorbitol which have laxative effect 4) figs- contain an enzyme ficin which helps to speed colonic transit 5) kiwis- contain an enzyme actinidin that improves gut motility and reduces constipation 6) oranges- rich in pectin (like apples) 7) grapefruits- contain a flavanol naringenin which has a laxative effect 8) vegetables- rich in fiber and also great sources of folate, vitamin C, and K 9) artichoke- high in inulin, prebiotic great for the microbiome 10) chicory- increases stool frequency and softness (can be added to coffee) 11) rhubarb- laxative effect 12) sweet potato- high fiber 13) beans, peas, and lentils- contain both soluble and insoluble fiber 14) chia seeds- improves intestinal health and gut flora 15) flaxseeds- laxative effect 16) whole grain rye bread- high in fiber 17) oat bran- high in soluble and insoluble fiber 18) kefir- softens stools -recommended to try at least one of these foods every day.  -drink 6-8 glasses of water per day -walk regularly, especially after meals.   10) Osteopenia - was tapered off the prednisone  -discussed that currently her pain may be more of a risk to her than osteopenia given her  suicidal ideation from her pain -discussed risk of fall with the medications that make her dizziness.   11) Daytime sleepiness -discussed that this is secondary to her medications.  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  -discussed red light therapy  15) HTN: -discussed clonidine  patch could potentially help with HTN and pain.  -BP is 170/90 today.  -Advised checking BP daily at home and logging results to bring into follow-up appointment with PCP and myself. -Reviewed BP meds today.  -Advised regarding healthy foods that can help lower blood pressure and provided with a list: 1) citrus foods- high in vitamins and minerals 2) salmon and other fatty fish - reduces inflammation and oxylipins 3) swiss chard (leafy green)- high level of nitrates 4) pumpkin seeds- one of the best natural sources of magnesium 5) Beans and lentils- high in fiber, magnesium, and potassium 6) Berries- high in flavonoids 7) Amaranth (whole grain, can be cooked similarly to rice and oats)- high in magnesium and fiber 8) Pistachios- even more effective at reducing BP than other nuts 9) Carrots- high in phenolic compounds that relax blood vessels and reduce inflammation 10) Celery- contain phthalides that relax tissues of arterial walls 11) Tomatoes- can also improve cholesterol and reduce risk of heart disease 12) Broccoli- good source of magnesium, calcium , and potassium 13) Greek yogurt: high in potassium and calcium  14) Herbs and spices: Celery seed, cilantro, saffron, lemongrass, black cumin, ginseng, cinnamon, cardamom, sweet basil, and ginger 15) Chia and flax seeds- also help to lower cholesterol and blood sugar 16) Beets- high levels of nitrates that relax blood vessels  17) spinach and  bananas- high in potassium  -Provided lise of supplements that can help with hypertension:  1) magnesium: one high quality brand is Bi[employer]em[employer] since it contains all 7 types of magnesium, otherwise over the counter magnesium gluconate 400mg  is a good option 2) B vitamins 3) vitamin D  4) potassium 5) CoQ10 6) L-arginine  7) Vitamin C 8) Beetroot -Educated that goal BP is 120/80. -Made goal to incorporate some of the above foods into diet.    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  -discussed that she had weaned off the steroids and had been doing fine off the steroids -discussed that aquatic therapy can help -discussed that she should encourage follow-up with rheumatology to assess for giant cell arteritis -[facility] -continue 2mg  steroids  16. Sciatica: -consider red light therapy -discussed that this has improved -discussed aquatherapy -discussed that the spinal injection helped temporarily -discussed that she has been taking 4mg  of low dose naltrexone -discussed that she used some hydrocodone  she had left over from the past.  -discussed lumbosacral orthosis, advised using for activities that involve bending/twisting/walking on uneven terrain -discussed that it would probably be better to repeat steroid injection than to continue hydrocodone  -advised to use lidocaine  patch -continue capsaicin  cream  17. Family illness: -discussed that she lost 2 nephews recently  80. Depression:  -continue cymbalta  -encouraged follow-up with psychiatry   19. Dizziness: -discussed that this has improved  20. 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   22. Constipation: Recommended eating an apple every day Recommended eating prunes every day

## 2024-02-29 ENCOUNTER — Encounter: Attending: Physical Medicine and Rehabilitation | Admitting: Physical Medicine and Rehabilitation

## 2024-02-29 DIAGNOSIS — G500 Trigeminal neuralgia: Secondary | ICD-10-CM | POA: Diagnosis not present

## 2024-02-29 MED ORDER — TAPENTADOL HCL ER 50 MG PO TB12: 50.0000 mg | ORAL_TABLET | Freq: Two times a day (BID) | ORAL | 0 refills | Status: DC

## 2024-03-08 ENCOUNTER — Encounter: Payer: Self-pay | Admitting: Physical Medicine and Rehabilitation

## 2024-03-08 DIAGNOSIS — F4542 Pain disorder with related psychological factors: Secondary | ICD-10-CM | POA: Diagnosis not present

## 2024-03-09 ENCOUNTER — Encounter (HOSPITAL_BASED_OUTPATIENT_CLINIC_OR_DEPARTMENT_OTHER): Admitting: Physical Medicine and Rehabilitation

## 2024-03-09 DIAGNOSIS — G500 Trigeminal neuralgia: Secondary | ICD-10-CM | POA: Diagnosis not present

## 2024-03-09 MED ORDER — FENTANYL 25 MCG/HR TD PT72: 1.0000 | MEDICATED_PATCH | TRANSDERMAL | 0 refills | Status: DC

## 2024-03-09 MED ORDER — GABAPENTIN 100 MG PO CAPS: 200.0000 mg | ORAL_CAPSULE | Freq: Four times a day (QID) | ORAL | 3 refills | Status: AC

## 2024-03-09 NOTE — Progress Notes (Signed)
 Subjective:    Patient ID: [name], female    DOB: [birth year], 83 y.o.   MRN: [id number]  HPI  An audio/video tele-health visit is felt to be the most appropriate encounter for this patient at this time. This is a follow up tele-visit via phone. The patient is at home. MD is at office. Prior to scheduling this appointment, our staff discussed the limitations of evaluation and management by telemedicine and the availability of in-person appointments. The patient expressed understanding and agreed to proceed.   [name] presents today for f/u of severe trigeminal neuralgia.   1) Severe trigeminal neuralgia: -pain continues to be severe -on the lower dose fentanyl  now- 25mcg- tolerating this well -Nucynta  has not been improved  -the gabapentin  continues to help -she had a program setting on her stimulator last week that was effective, she plans for follow up with [facility] about this -they have not been able to find anyone who does management of the pain pump- she still wants to get it done -she feels the medication is not helping and that the stimulator is not helping -several times she has turned off the stimulator and that was not enough -she does not get negative side effects from the Fentanyl  patch -she is not doing as well as she had hoped she would after visiting Dr. Colon -they had changed some programs and these had really helped while she was in [place] -she stayed with her son in [place]  after her trip to [place] and got to see her great grandson! But after she got home her pain got so much worse. She is not sure if the distraction of family helped her -she and [name] got really tried during the trip- the floors were slick in the shower -today pain is 8/10 -she needs refill of fentanyl  patch -mouth gets dry from gabapentin  -she is planning to see Dr. Colon at [facility] again -she went to [facility] 2 weeks ago -Friday or Saturday night it was time for her to have  another patch and her husband felt she should go off the patch -she is doing well with hydrocodone  and fentanyl  patch -had been unable to go to the gym when her pain was severe -she is not doing very well -they had to cancel one of the appointments due to snow -the next week [name] had the flu -she turned off the device for 2 weeks as it was making her pain worse -she had an appointment with [name] on Monday and tried new programs and they seemed to be working better -she is very happy with her new [facility] but has not found any helpful settings yet -she turns off the stimulator at night as she can sleep well at night -if she turns off the stimulator during the day she has 1 hour of relief and then the old pain returns -the last two or three days she has had bad pain -she is filling out pain diaries -she was able to cook Thanksgiving dinner but felt a lot of pain the next day -she is doing ok -she likes her new physician at [facility]. He works a lot with the [employer] rep because the rep is just for back pain -he takes pictures and asks for the location to be adjusted -at [facility] she is just given one program and then it stops working.  -Saw Dr. Colon 8 weeks ago and the new programs worked but then it stops working  when she returns home -she is seeing neurosurgeon [name] on Thursday  [name] researched red light therapy -she went to see the doctor in [place]  and was only been able to spend 5 minutes with her -they did about 15 minutes of adjustments and the new programs seemed to hurt more than they help -has had stimulator turned off since last night -was able to enjoy her birthday -she did well for 3 weeks but recently pain has been around 8-10 -when pain is very severe it makes her eye hurt -the way her stimulator is set it makes her nose hurt She is going back to [facility] on April 10th -she is managing with the gabapentin  and had to take 2 extra yesterday  and it did not make her sleepier  -pain fluctuates  -she went to [place] last week with her children.  -she keeps a journal of when she changes the program and what her pain level is in her teeth, eyes, and cheek. She put in not to use the teeth program -she said if they could hit the nerve between the two teeth that would be helpful -they have her an 11 and 12 in that region,  -she cannot get the right level of stimulation. Before she left she decreased it by 50%. Now she is on a cheat program and the lowest amount. It is helping the teeth but hurting the eye.  -Dr. Colon had a baby on 11/14 and she is not coming back until January.  -she talked to the representative -last night she turned off the stimulator -she slept almost day today and did not want to wake up for supper which was rare for her -she is having a difficult time weaning off the hydrocodone .  -she has been off of it in two days -she is expecting a return call from [name] team soon to discuss wean off fentanyl  -drove up on Tuesday and on Wednesday was supposed to have an appointment with pain psychologist. When they got there they were told that appointment had been changed to virtual. The next day they had a more sophisticated MRI. [name] did moving in MRI so MRI was not as helpful. The next day they saw Dr. Colon and discussed several procedural options and then went back to the motor stimulator- that is scheduled on October 16th.  -she recently went on vacation and had to spend the majority of the vacation in bed due to the severity of her pain -she stopped using the intranasal ketamine because although it helped initially, it appeared to make her pain worse later -she asks if she can wean off some medication as she had a recent appointment with Dr. Colon and was told she may have a lot of postoperative pain if she is taking so much pain medication. She would still like to do this and also experiences sedation from her  medication. She asks how she would go about weaning her medications -she asks if she should wean off the gabapentin  since this makes her so sleepy She asks if she should restart Topamax . -she was encouraged by her phone visit with Dr. Colon and plans to pursue surgery. She is going to [facility] next week for Brain MRI, neuropsych eval, and to discuss the risks and benefits of the procedure with Dr. Colon -pain was so severe in the past that she expressed suicidality to her husband Zell.  -she feels that none of her medications are helpful but she knows that when she tries  to wean them her pain worsens.  -she asks about refill of the compounding cream -her husband asks about referral to ketamine clinic for depression rather than pain -she went back to the dentist because it was urting between two teeth and he said there is nothing wrong with those teeth and it is the trigeminal nerve When she took a shower that usually seems to help with but was migrating around her face.  -appointment with neurology in July -Dr. Colon cancelled the zoom call in April but this was rescheduled to July.  -she finds that only the gabapentin  helps -she is not sure if the fentanyl  helps When she is feeling really bad she tries to take another one but she gets very sleepy and dizzy.  -she is taking 8 a day of 100mg  per day- she takes two gabapentin  and tylenol  and hydrocodone  in the morning. She takes another two at breakfast around 11, another 2 at lunch around 1:30pm.  -she had a recent root canal and pain has become much more severe after that -her pain is in the two teeth next to where she had the root canal -Oragel heps for a bout an hour.  -she tried increasing her Gabapentin  to 300mg  TID but she could not tolerate this. It made her too sleepy.  -she contacted Dr. Darlis and he recommended she pause wean of Fentanyl  patch and I agree with this -she took last patch last night. She still has 12.5mcg patch  available -her husband asks whether she should take a higher dose of gabapentin  -the fentanyl  patch makes her very sleepy and so allows her to sleep. It has also made her dizzier.  -the 50mcg fentanyl  patch did not provide more relief- she would like to wean off. Weaned to 25, then 12.51mcg, then off this week -referral was to [facility] and not to Dr. Kirke Solders she states and they need a new referral send to Dr. Solders -she is using the compound cream and this helps- she uses this twice per day -capsaicin  made it burn  -lamotrigine  makes her too sleepy and dizzy to come down the stairs by herself and she does not feel comfortable going to the shower -left eye more open than usual. -she does not feel she is getting relief from the fentanyl  patch -she feels that the procedure at [facility] is a last resort.  -Given darkening of skin and worsening eye lid closure, MRI brain stat obtained and shows new cyst that could be post-surgical. Recommended she go to ED given worsening dizziness and facial swelling as well. ED physician discussed case with NSGY and neurology and discharged home recommending follow-up with Dr. Dartha and Dr. Tarri. She was given IV Fentanyl  in the ED which helped and discharged on 12mcg Fentanyl  patch, which has not helped.  -discussed increasing Fentanyl  patch to 25mcg since she has not experienced any negative side effects, no respiratory depression, and her husband is agreeable. I have sent in new script to start Saturday, but insurance required prior auth, which was approved.  -tonight would be the third tay of the 25mcg Fentanyl  patch -she is still taking the Gabapentin  -she feels that when she lays down she is spinning.  -eye continues to appear more closed.  -Husband has called Dr. Sunnie and Dr. Teddi office to scheduled follow-up appointments and has appointments with both tomorrow.  -Will run out of Fentanyl  patch by Sunday -pain is  worsening and nothing is providing relief -she asks whether she  should go to ED -she has also noted dizziness and is not sure if this is from being in the MRI or from the Savella .  -morphine  was not effective and caused itching, sarna lotion did help wit this.  -hydrocodone  has been most effective thus far -nucynta  was not effective but she is not sure whether she gave it a fair trial -she has month appointments scheduled with Fidela and f/u with me in Feb -she used the Norco last night and this morning with good benefit and has started to feel the pain return this afternoon -she did find benefit from the compound cream- she continues to use this.  -She continues to have a sharp shooting pain doing around her eyelid and the tip teeth and in the top of her head.  -has a radiofrequency ablation treatment that helped but not completely -she is not having as much sharp knife-like pin like in her eye Her top teeth in her left law have been hurting really badly.  -she now takes the baclofen  with the gabapentin  and they seem to work well together.  -she had surgical procedure and feels worse -yesterday she took a half a tablet but this morning she took a whole one. She does fine with the hydrocodone  but a whole one makes her too sleepy.  -she is taking 2 100mg  Gabapentin  three times per day but it makes her dizzy and sleepy so she cannot funciton -the cold worsens her pain.  -The IV fentanyl  did help with the pain.  -her husband feels she should get one more dose of the 25mg  fentanyl . -she did get one week benefit from the steroid injection.  -she is doing terribly -she is miserable -she feels depressed due to the pain -she has considered taking the whole bottle of hydrocodone  because because she can't take this pain anymore.  -she started taking the Gabapentin  300mg  TID.  -she does find relief from the hydrocodone  -the pain is constant -she wonders if it is inflammation.  -she has not tried  turmeric. Her son-in-law gave her a bottle and she couldn't stomach it.   2) Fibromyalgia: -She has been almost nonfunctional due to the severity of her pain. It continues to be very severe.  -She would like to wean off the Gabapentin  as she feels at risk of falls -she is taking Topamax  BID. She felt this was helpful but made her unsteady on her feet.  -still using gabapentin  and this is helping.  -she feels so fatigued that she can only have a shower every other day.   3) Osteoporosis: -husband asks whether she should get a Prolia  shot.   4) Dizziness: -head was swimming -she felt like she was going to fall.  -worsening -feels like she is spinning, not the room  -She tried the Phenytoin  for 2 days and did not feel benefit so stopped it.   -she feels the gabapentin  aggravates this and she would love to wean off but knows it helps her.   -She feels very sleepy during the day.   -She has never tried Topirmate before.   5) Sore throat -developed stopping lamotrigine  since not helping.   Prior history:  Her pain continues to be in her left eye, forehead. She has not tried any topicals for pain. She uses capsaicin  for her back and knees.   Since last visit nothing we have tried has helped with her pain. She has tried to decrease her Gabapentin  dose but then her pain increased so she  has resumed taking 4 Gabapentin  per day.   Her husband asks whether he should contact her surgeon regarding whether nerve may have become unclamped.   Discussed prolotherapy, trigger point injections, Baclofen , Clonazepam, Phenytoin  as other options we could try,=.  Pain continues to be excruciating and she states she is not sure how much longer she can live like this.   Prior history: She has having some nausea, lightheadedness and felt this may be related to her Gabapentin . She did not take any yesterday and then felt more sever pain in the evening. She also gets electrical shooting across her jaw.    She does not feel that she has cluster headaches as the pain has been so constant. She feels that she is getting worse. The oxygen made her feel relaxed but it is not helping.   The Sarna lotion does help with the itching in her head.   She was willing to try the Amitriptyline  but it makes her too sleepy in the morning.   Her teeth and left eye incredibly hurt.   She feels that the Gabapentin  has been making her dizziness.   She asks about biofeedback.   The pain is 7/10 on average, similar to last time.   When the pain is severe it can make her depressed.    6) Macular degeneration:  Has been following with ophthalmology  7) Family illness: -she lost two nephews recently  8) Knee osteoarthritis: -had a cortisone shot and this really helped  9) Constipation: -she gets this from the hydrocdone -she has a BM every day and half- there is straining -[name] says diet is poor -she tries to eat a lot of fruit for breakfast  Pain Inventory Average pain 10 Pain Right Now 10 My pain is constant, tingling, sharp, dull, stabbing, aching, numbness, burning  In the last 24 hours, has pain interfered with the following? General activity 10 Relation with others 5 Enjoyment of life 8  What TIME of day is your pain at its worst? Morning, daytime, evening, night Sleep (in general) Good  Pain is worst with  cold air, bending, damp air  Pain improves with heat, medication   Relief with medication is  4   Family History  Problem Relation Age of Onset   Heart disease Mother    Heart disease Father    Stroke Father    Stroke Sister    Asthma Daughter    Colon cancer Neg Hx    Social History   Socioeconomic History   Marital status: Married    Spouse name: Not on file   Number of children: 2   Years of education: college   Highest education level: Not on file  Occupational History   Occupation: housewife    Employer: RETIRED  Tobacco Use   Smoking status: Never    Smokeless tobacco: Never  Vaping Use   Vaping status: Never Used  Substance and Sexual Activity   Alcohol use: No    Alcohol/week: 0.0 standard drinks of alcohol   Drug use: No   Sexual activity: Not on file  Other Topics Concern   Not on file  Social History Narrative   1 cup of Jasmine tea daily    Social Drivers of Health   Financial Resource Strain: Low Risk  (01/16/2024)   Received from [facility]   Overall Financial Resource Strain (CARDIA)    Difficulty of Paying Living Expenses: Not hard at all  Food Insecurity: No Food Insecurity (  01/16/2024)   Received from [facility]   Hunger Vital Sign    Within the past 12 months, you worried that your food would run out before you got the money to buy more.: Never true    Within the past 12 months, the food you bought just didn't last and you didn't have money to get more.: Never true  Transportation Needs: No Transportation Needs (01/16/2024)   Received from [facility] - Transportation    In the past 12 months, has lack of transportation kept you from medical appointments or from getting medications?: No    Lack of Transportation (Non-Medical): No  Physical Activity: Inactive (11/13/2019)   Received from [facility] visits prior to 08/22/2022.   Exercise Vital Sign    On average, how many days per week do you engage in moderate to strenuous exercise (like a brisk walk)?: 0 days    On average, how many minutes do you engage in exercise at this level?: 0 min  Stress: No Stress Concern Present (11/13/2019)   Received from [facility] visits prior to 08/22/2022.   [employer] of Occupational Health - Occupational Stress Questionnaire    Feeling of Stress : Not at all  Social Connections: Socially Integrated (11/13/2019)   Received from [facility] visits prior to 08/22/2022.   Social Connection and Isolation  Panel    In a typical week, how many times do you talk on the phone with family, friends, or neighbors?: More than three times a week    How often do you get together with friends or relatives?: More than three times a week    How often do you attend church or religious services?: 1 to 4 times per year    Do you belong to any clubs or organizations such as church groups, unions, fraternal or athletic groups, or school groups?: Yes    How often do you attend meetings of the clubs or organizations you belong to?: 1 to 4 times per year    Are you married, widowed, divorced, separated, never married, or living with a partner?: Married   Past Surgical History:  Procedure Laterality Date   CHOLECYSTECTOMY  03/2010   COLONOSCOPY  2009   Dr. Author    CYSTOSCOPY     Multiple Cystoscopies with stents or biopsy    ESOPHAGOGASTRODUODENOSCOPY  10/2013   EYE SURGERY     bilateral cataract removal   Gamma knife radiation  01/2018   for trigeminal neuralgia   REFRACTIVE SURGERY Bilateral 2022   RHIZOTOMY Left 04/28/2021   Procedure: Left Percutaneous trigeminal balloon rhizotomy;  Surgeon: Cheryle Debby LABOR, MD;  Location: [facility] OR;  Service: Neurosurgery;  Laterality: Left;   TONSILLECTOMY     Past Surgical History:  Procedure Laterality Date   CHOLECYSTECTOMY  03/2010   COLONOSCOPY  2009   Dr. Author    CYSTOSCOPY     Multiple Cystoscopies with stents or biopsy    ESOPHAGOGASTRODUODENOSCOPY  10/2013   EYE SURGERY     bilateral cataract removal   Gamma knife radiation  01/2018   for trigeminal neuralgia   REFRACTIVE SURGERY Bilateral 2022   RHIZOTOMY Left 04/28/2021   Procedure: Left Percutaneous trigeminal balloon rhizotomy;  Surgeon: Cheryle Debby LABOR, MD;  Location: [facility] OR;  Service: Neurosurgery;  Laterality: Left;   TONSILLECTOMY     Past Medical History:  Diagnosis Date   Adenomatous colon  polyp 03/24/2018   Allergy    Arthritis    Cataract    Chronic headaches     Depression    Fibromyalgia    Gallstones    GERD (gastroesophageal reflux disease)    Hyperlipemia    Hypertension    Interstitial cystitis    Lactose intolerance    LBBB (left bundle branch block)    Osteoarthritis    Osteopenia    Pneumonia    Thyroid  disease    Tinnitus    Trigeminal neuralgia    There were no vitals taken for this visit.  Opioid Risk Score:   Fall Risk Score:  `1  Depression screen [facility] 2/9     02/08/2024   11:35 AM 11/19/2023   10:10 AM 10/05/2023    9:18 AM 05/25/2023   11:27 AM 02/23/2023   11:21 AM 12/28/2022    1:35 PM 10/27/2022    2:05 PM  Depression screen PHQ 2/9  Decreased Interest 0 0 0 0 0 0 0  Down, Depressed, Hopeless 0 0 0 0 0 0 0  PHQ - 2 Score 0 0 0 0 0 0 0  Altered sleeping  0       Tired, decreased energy  0       Feeling bad or failure about yourself   0       Trouble concentrating  0       Moving slowly or fidgety/restless  0       Suicidal thoughts  0       PHQ-9 Score  0         Review of Systems  Constitutional: Negative.   HENT: Negative.    Eyes:  Positive for visual disturbance.  Respiratory: Negative.    Cardiovascular: Negative.   Gastrointestinal: Negative.   Endocrine: Negative.   Genitourinary: Negative.   Musculoskeletal: Negative.        Pain in left side of face  Skin: Negative.   Allergic/Immunologic: Negative.   Neurological:  Positive for dizziness, tremors, light-headedness and numbness.       Tingling   Hematological: Negative.   Psychiatric/Behavioral: Negative.    All other systems reviewed and are negative.      Objective:   Physical Exam PRIOR EXAM: Gen: no distress, normal appearing HEENT: oral mucosa pink and moist, NCAT Cardio: Reg rate Chest: normal effort, normal rate of breathing Abd: soft, non-distended Ext: no edema Psych: pleasant, normal affect Skin: intact Neuro: Alert and oriented x3, left eye drop and facial droop- stable . Assessment & Plan:  Mrs. Jergens is an 83  year old woman who presents for f/u of left sided trigeminal neuralgia and dizziness.    1) Sensory deafferation pain, left sided trigeminal neuralgia vs cluster headaches (she does have lacrimation and runny nose on that side). -discussed that her pain continues to be severe, advised increasing gabapentin  to 200mg  QID, discussed that she needs refill of Fentanyl  patch 25mcg- sent 10 patches -discussed that seeing the water on the beach caused dizziness -decrease fentanyl  patch to 25mcg -Nucynta  50mg  BID prescribed, discussed that we can try this instead of Fentanyl  patch if her insurance approves it -discussed that she has been happy with her new provider at [facility] -discussed that she is following up with Dr. Colon at [facility] -refilled fentanyl , discussed that she tried stopping this and pain was very severe -avoid proceed foods, eggs, gluten, dairy -recommended eating furits and vegetables -continue hydrocodone , discussed that she feel she needs  either a higher dose or frequency, discussed that she has become somewhat more tolerant to the medication than she was 15 years ago -discussed that gabapentin  used to make her to lean to the left but now it makes her lean to the right -discussed that the gabapentin  makes her sleepy -discussed that she feels that the hydrocodone  is more effective for her than the gabapentin  -discussed [name] recommendation for an intrathecal pain pump, discussed that she has a trial scheduled for the pump, discussed that she is considering this at [facility] -encouraged discussing with with the [facility] team risks and benefits of this procedure -discussed that she is doing Zoom calls with a psychiatrist -d/c narcan  and naltrexone, discussed that she uses the hydrocodone  sparingly.  -discussed that she had new programs installed on Monday -discussed that she is doing well with the hydrocodone  -discussed pruritus with the fentanyl  patch -continue following with pain  psychologist, discussed that I think it is phenomenal that she is doing this -refilled hydrocodone  and fentanyl  -discussed rating her pain for the programming of electrical stimulation   -discussed mechanism of action of low dose naltrexone as an opioid receptor antagonist which stimulates your body's production of its own natural endogenous opioids, helping to decrease pain. Discussed that it can also decrease T cell response and thus be helpful in decreasing inflammation, and symptoms of brain fog, fatigue, anxiety, depression, and allergies. Discussed that this medication needs to be compounded at a compounding pharmacy and can more expensive. Discussed that I usually start at 1mg  and if this is not providing enough relief then I titrate upward on a monthly basis.    -discussed that savella  caused hallucinations -discussed that we tried lyrica  and we cannot recall but she had some side effect -discussed that topamax  did not help and made her feel worse -discussed that carbamazpeine did not help -discussed that she is happy that she does not have to return to [place] as those trips caused her a lot of fatigue -discussed that pain was bad after she cooked for Thanksgiving -discussed her follow-up with [name] -discussed the negative experience when she last went to [place]  -discussed that next available appointment was on June 24th or 26th, but they were able to move this up to May 22nd or 24th -discussed that she keeps a journal with the setting with the level of her pain.  -continue Diclofenac 3%, Gabapentin  5%, Lidocaine  5%, Menthol 1% compounded at [facility]:  apply 1-2 pumps three to four times per day as needed, discussed that this dose help -discussed that she has had to turn off the stimulator more since she has established care at [facility] -discussed response to her stimulator -discussed doubling the gabapentin  when pain is severe Increase low dose naltrexone to 3mg  HS -discussed  her response to the stimulator She does have a history of migraines.  -Discussed that if she has to be in bed all day due to her pain it may be beneficial for her husband to give her at least one hydrocodone  per day to enable her to function -provided refill of Fentanyl  patch as she will be leaving for her surgery in [place] soon and her husband plans to stay there for some time in case she has any surgical complications.  -discussed her follow-up with pain psychology.  -recommended weaning off Fentanyl , discussed stopping fentanyl  because she is on 12.5mg  today but she had tried this a couple of weeks ago and pain was so severe that she needed  patch to be replaced.  -discussed that she is taking hydrocodone  4 tablets per day, and recommend to decrease this to 3 tabs per day for three days and then to keep decreasing the tab by once per day.  -discussed that skipping the morning dose of hydrocodone  will at least allow her to sleep -recommended trying Frankincense and Myrrh essential oils mixed in coconut oil and applied to area of pain -recommended restarting Topamax  -called [facility] to prescribe compounding gel refill -continue Gabapentin  100mg  up to 8 times a day, discussed her current schedule of takin -discussed using Oragel multiple times per ay since this helps for an hour, use the topical cream intermittently in between -continue follow-up with [facility] to set up an appointment for her with them. Unfortunately Dr. Zulema has retired but was able to set her up with Dr. Binnie who specializes in migraines and trigeminal neuralgia on July 25th. Let patient and husband know. They will mail her their address.   -commended her for calling Dr. Darlis as well to let him know how she is feeling -recommended taking 1 hydrocodone  now for the severe pain, and discussed that the Fentanyl  patch with take 12-24 hours to kick in -discussed with April the neurology referral and she did  specify Dr. Kirke Zulema and spoke with the [facility] department -discussed CBD oil, her son bought her some of this and she felt that it greatly helped  Recommend topical CBD oil- discussed its benefits in reducing inflammation, pain, insomnia, and anxiety, but she stopped because effect wore of.  -Discussed that CBD oil differs from marijuana in that it does not contain THC- the substance that causes euphoria.  -Discussed that it is made from the hemp plant.  -It has been used for thousands of years -preliminary research suggests that if may be able to shrink cancerous tumors, stop plaque formation in Alzheimer's Disease, and slow the progress of brain disease from concussions.  -Additional benefits that have been demonstrated in studies include improved nausea, indigestion, and brain health, and reduced seizures.  -In a survey 92% of patients who tried medical cannabis felt it improved symptoms such as chronic pain, arthritis, migraines, and cancer.   -Provided with a pain relief journal and discussed that it contains foods and lifestyle tips to naturally help to improve pain. Discussed that these lifestyle strategies are also very good for health unlike some medications which can have negative side effects. Discussed that the act of keeping a journal can be therapeutic and helpful to realize patterns what helps to trigger and alleviate pain.   -discussed with Dr. Saul trigeminal deafferation pain, discussed the difference between this and trigeminal neuralgia with patient and husband, discussed motor cortex stimulation. -discussed whether or not to continue fentanyl  patch, husband does feel that the IV fentanyl   -encouraged following with Dr. Sweet/Dr. Cleotilde at [facility] to try procedure recommended by D. Ostegard.  -discussed mechanism of action of low dose naltrexone as an opioid receptor antagonist which stimulates your body's production of its own natural endogenous opioids,  helping to decrease pain. Discussed that it can also decrease T cell response and thus be helpful in decreasing inflammation, and symptoms of brain fog, fatigue, anxiety, depression, and allergies. Discussed that this medication needs to be compounded at a compounding pharmacy and can more expensive. Discussed that I usually start at 1mg  and if this is not providing enough relief then I titrate upward on a monthly basis.   -stop lamotrigine  as  it is making her dizzy without benefit -reviewed patient's medical notes -discussed her progress with getting a referral to [facility] -recommended going to ED given darkening of skin, worsening eye closure, finding of cyst, dizziness to receive prompt neurosurgical eval regarding if surgery is necessary, and also for pain management given worsening pain refractory to all treatments and resulting suicidality. Reviewed ED doc notes- he discussed with on-call neurology and NSGY and determined that patient could be discharged with outpatient follow-up. IV fentanyl  was administered with relief to patient. She was discharged on fentanyl  patch which has not provided relief. She is getting 12mcg- no side effects- discussed increasing to 25mcg and her husband is in agreement. Prior auth completed and approved. Discussed with husband starting patch on Satruday after she has worn 12mcg patch for 72 hours -encouraged follow-up with Dr. Saul, husband let me know appointment has been set up 1/18.  -recommended follow-up with Dr. 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  -Discussed that turmeric  -discussed steroid taper. -continue savella , husband feels like  it is helping with her suicidal thoughts. Increase to 25mg .  Discontinued hydrocodone   -discuss mouth guard with dentist.  -d/c topamax  -she does have another procedure scheduled. -refilled Norco -follow-up monthly with Fidela and with me in February, placed on waitlist for earlier appointment with me, advised phone visits/MyChart are a great way to communicate in the interim -discussed that she can take an additional one or two tylenol  for breakthrough pain -recommended checking CMP for liver enzymes with next labs -prescribed NAC 600mg  BID to protect the liver while taking tylenol , discussed its health benefits for mitochondrial function -d/c Baclofen   -f/u with Dr. Saul and Dr. Darlis 1/18 -discussed retrying carbamazepine  to see if it is effective for her. It is $89 for her, so we discussed trying Lamictal  instead.  -encouraged anti-inflammatory diet.  -she had sinusitis that was viral and was treated with antibiotics. This was in the early 90s. The symptoms resolved after 2 months. She uses to get sinus infections and saline rinse.  -She has had multiple root canals.  -Discontinue 100% oxygen via facemask as this was not helpful.  -Discussed that unfortunately Sprint PNS is not indicated for craniofacial pain. -Amitriptyline  and Lyrica  did not help.  -Continue Cymbalta  60mg .  -Topiramate  helped but caused dizziness.  -Provided with hand out with information regarding trigeminal neuralgia -Failed Penytoin.  -trial lidocaine  patch -She would like to try biofeedback. -reviewed neurology note.  -referred to Dr. Kirke Solders.  -will try trigger point injections next visit.  -Continue Ketamine 10%, Baclofen  2%, Cyclobenzaprine 2%, Ketoprofen 10%, Gabapentin  6%, Bupivacaine 1%, Amitryptiline 5% to apply to painful areas- using 3-4 times per day.  -Can consider Meloxicam if Phenytoin  does not help.  -Discussed Qutenza  as an option for neuropathic pain control. Discussed that this  is a capsaicin  patch, stronger than capsaicin  cream. Discussed that it is currently approved for diabetic peripheral neuropathy and post-herpetic neuralgia, but that it has also shown benefit in treating other forms of neuropathy. Provided patient with link to site to learn more about the patch: [email or web address].qutenza .[email or web address]/. Discussed that the patch would be placed in office and benefits usually last 3 months. Discussed that unintended exposure to capsaicin  can cause  severe irritation of eyes, mucous membranes, respiratory tract, and skin, but that Qutenza  is a local treatment and does not have the systemic side effects of other nerve medications. Discussed that there may be pain, itching, erythema, and decreased sensory function associated with the application of Qutenza . Side effects usually subside within 1 week. A cold pack of analgesic medications can help with these side effects. Blood pressure can also be increased due to pain associated with administration of the patch.  -she cut down on her gabapentin  due to her dizziness    Turmeric to reduce inflammation--can be used in cooking or taken as a supplement.  Benefits of turmeric:  -Highly anti-inflammatory  -Increases antioxidants  -Improves memory, attention, brain disease  -Lowers risk of heart disease  -May help prevent cancer  -Decreases pain  -Alleviates depression  -Delays aging and decreases risk of chronic disease  -Consume with black pepper to increase absorption    Turmeric Milk Recipe:  1 cup milk  1 tsp turmeric  1 tsp cinnamon  1 tsp grated ginger (optional)  Black pepper (boosts the anti-inflammatory properties of turmeric).  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  -she is a good metabolizer of amitriptyline  and has tolerated nortrypilline in the past, but without much benefit.    3) CKD: encouraged 6-8 glasses of water per day. She states that she had AKI with Ibuprofen in the past. Discussed checking Cr level next visit if we are considering Meloxicam -Discussed that Nucynta , and other pain medications, may stay in her bloodstream longer given her CKD   4) Hypernatremia: encouraged hydration   5) General health and pain: provided with list of healthy foods that are both nutritious and fight pain.   6) Depressed: Amitriptyline  did help with this but she could not tolerate the drowsiness. Encouraged focusing on the factors that she can control, such as anti-inflammatory diet.   7) Fibromyalgia: Discussed goal to wean steroids prior to surgery. Continue 1mg  daily. She is on that intermittently. She started this again about a month ago. She has been on this about a year. -avoid gluten/dairy -continue gabapentin  -recommended eating a lot of fruits and vegetables -recommended avoiding eggs, gluten, dairy, processed foods Continue Savella  -discussed benefits of cold water showers.  -continue gabapentin .  Continue fentanyl  and hydrocodone  -avoid processed foods -avoid gluten, dairy, and eggs -eat fruits, vegetables, spices, and herbs -Discussed current symptoms of pain and history of pain.  -Discussed benefits of exercise in reducing pain. -Discussed following foods that may reduce pain: 1) Ginger (especially studied for arthritis)- reduce leukotriene production to decrease inflammation 2) Blueberries- high in  phytonutrients that decrease inflammation 3) Salmon- marine omega-3s reduce joint swelling and pain 4) Pumpkin seeds- reduce inflammation 5) dark chocolate- reduces inflammation 6) turmeric- reduces inflammation 7) tart cherries - reduce pain and stiffness 8) extra virgin olive oil - its compound olecanthal helps to block prostaglandins  9) chili peppers- can be eaten or applied  topically via capsaicin  10) mint- helpful for headache, muscle aches, joint pain, and itching 11) garlic- reduces inflammation 12) Green tea- reduces inflammation and oxidative stress, helps with weight loss, may reduce the risk of cancer, recommend Double [employer] of Tea daily  Link to further information on diet for chronic pain: [email or web address]   8) Dizziness: -discussed that this could be from the combination of gabapentin  and baclofen .  -discussed that all the medications we are trying for pain may worsen the dizziness  9) Constipation:  -Provided list of following foods that help with constipation and highlighted a few: 1) prunes- contain high amounts of fiber.  2) apples- has a form of dietary fiber called pectin that accelerates stool movement and increases beneficial gut bacteria 3) pears- in addition to fiber, also high in fructose and sorbitol which have laxative effect 4) figs- contain an enzyme ficin which helps to speed colonic transit 5) kiwis- contain an enzyme actinidin that improves gut motility and reduces constipation 6) oranges- rich in pectin (like apples) 7) grapefruits- contain a flavanol naringenin which has a laxative effect 8) vegetables- rich in fiber and also great sources of folate, vitamin C, and K 9) artichoke- high in inulin, prebiotic great for the microbiome 10) chicory- increases stool frequency and softness (can be added to coffee) 11) rhubarb- laxative effect 12) sweet potato- high  fiber 13) beans, peas, and lentils- contain both soluble and insoluble fiber 14) chia seeds- improves intestinal health and gut flora 15) flaxseeds- laxative effect 16) whole grain rye bread- high in fiber 17) oat bran- high in soluble and insoluble fiber 18) kefir- softens stools -recommended to try at least one of these foods every day.  -drink 6-8 glasses of water per day -walk regularly, especially after meals.   10) Osteopenia - was tapered off the prednisone  -discussed that currently her pain may be more of a risk to her than osteopenia given her suicidal ideation from her pain -discussed risk of fall with the medications that make her dizziness.   11) Daytime sleepiness -discussed that this is secondary to her medications.  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  -discussed red light therapy  15) HTN: -discussed clonidine  patch could potentially help with HTN and pain.  -BP is 170/90 today.  -Advised checking BP daily at home and logging results to bring into follow-up appointment with PCP and myself. -Reviewed BP meds today.  -Advised regarding healthy foods that can help lower blood pressure and provided with a list: 1) citrus foods- high in vitamins and minerals 2) salmon and other fatty fish - reduces inflammation and oxylipins 3) swiss chard (leafy green)- high level of nitrates 4) pumpkin seeds- one of the best natural sources of magnesium 5) Beans and lentils- high in fiber, magnesium, and potassium 6) Berries- high in flavonoids 7) Amaranth (whole grain, can be cooked similarly to rice and oats)- high in magnesium and fiber 8) Pistachios- even more effective at reducing BP than other nuts 9) Carrots- high in phenolic compounds that relax blood vessels and  reduce inflammation 10) Celery- contain phthalides that relax tissues of arterial walls 11) Tomatoes- can also improve cholesterol and reduce risk of heart disease 12) Broccoli- good source of magnesium, calcium , and potassium 13) Greek yogurt: high in potassium and calcium  14) Herbs and spices:  Celery seed, cilantro, saffron, lemongrass, black cumin, ginseng, cinnamon, cardamom, sweet basil, and ginger 15) Chia and flax seeds- also help to lower cholesterol and blood sugar 16) Beets- high levels of nitrates that relax blood vessels  17) spinach and bananas- high in potassium  -Provided lise of supplements that can help with hypertension:  1) magnesium: one high quality brand is Bi[employer]emizers since it contains all 7 types of magnesium, otherwise over the counter magnesium gluconate 400mg  is a good option 2) B vitamins 3) vitamin D  4) potassium 5) CoQ10 6) L-arginine 7) Vitamin C 8) Beetroot -Educated that goal BP is 120/80. -Made goal to incorporate some of the above foods into diet.    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  -discussed that she had weaned off the steroids and had been doing fine off the steroids -discussed that aquatic therapy can help -discussed that she should encourage follow-up with rheumatology to assess for giant cell arteritis -[facility] -continue 2mg  steroids  16. Sciatica: -consider red light therapy -discussed that this has improved -discussed aquatherapy -discussed that the spinal injection helped temporarily -discussed that she has been taking 4mg  of low dose naltrexone -discussed that she used some hydrocodone  she had left over from the past.  -discussed lumbosacral orthosis, advised using for activities that involve bending/twisting/walking on uneven terrain -discussed that it would probably be better to repeat steroid injection than to continue hydrocodone  -advised to use lidocaine   patch -continue capsaicin  cream  17. Family illness: -discussed that she lost 2 nephews recently  74. Depression:  -continue cymbalta  -encouraged follow-up with psychiatry   19. Dizziness: -discussed that this has improved  20. 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   22. Constipation: Recommended eating an apple every day Recommended eating prunes every day  5 minutes spent in discussion that her pain continues to be severe, advised increasing gabapentin  to 200mg  QID, discussed that she needs refill of Fentanyl  patch 25mcg- sent 10 patches

## 2024-03-13 DIAGNOSIS — G509 Disorder of trigeminal nerve, unspecified: Secondary | ICD-10-CM | POA: Diagnosis not present

## 2024-03-14 DIAGNOSIS — M810 Age-related osteoporosis without current pathological fracture: Secondary | ICD-10-CM | POA: Diagnosis not present

## 2024-03-14 DIAGNOSIS — I10 Essential (primary) hypertension: Secondary | ICD-10-CM | POA: Diagnosis not present

## 2024-03-16 DIAGNOSIS — M810 Age-related osteoporosis without current pathological fracture: Secondary | ICD-10-CM | POA: Diagnosis not present

## 2024-03-21 DIAGNOSIS — I2510 Atherosclerotic heart disease of native coronary artery without angina pectoris: Secondary | ICD-10-CM | POA: Diagnosis not present

## 2024-03-21 DIAGNOSIS — M150 Primary generalized (osteo)arthritis: Secondary | ICD-10-CM | POA: Diagnosis not present

## 2024-03-21 DIAGNOSIS — J309 Allergic rhinitis, unspecified: Secondary | ICD-10-CM | POA: Diagnosis not present

## 2024-03-21 DIAGNOSIS — F339 Major depressive disorder, recurrent, unspecified: Secondary | ICD-10-CM | POA: Diagnosis not present

## 2024-03-21 DIAGNOSIS — Z23 Encounter for immunization: Secondary | ICD-10-CM | POA: Diagnosis not present

## 2024-03-21 DIAGNOSIS — E039 Hypothyroidism, unspecified: Secondary | ICD-10-CM | POA: Diagnosis not present

## 2024-03-21 DIAGNOSIS — I10 Essential (primary) hypertension: Secondary | ICD-10-CM | POA: Diagnosis not present

## 2024-03-21 DIAGNOSIS — M810 Age-related osteoporosis without current pathological fracture: Secondary | ICD-10-CM | POA: Diagnosis not present

## 2024-03-21 DIAGNOSIS — K219 Gastro-esophageal reflux disease without esophagitis: Secondary | ICD-10-CM | POA: Diagnosis not present

## 2024-03-21 DIAGNOSIS — Z0000 Encounter for general adult medical examination without abnormal findings: Secondary | ICD-10-CM | POA: Diagnosis not present

## 2024-03-21 DIAGNOSIS — I447 Left bundle-branch block, unspecified: Secondary | ICD-10-CM | POA: Diagnosis not present

## 2024-03-28 ENCOUNTER — Other Ambulatory Visit: Payer: Self-pay

## 2024-03-28 ENCOUNTER — Telehealth: Payer: Self-pay | Admitting: Physical Medicine and Rehabilitation

## 2024-03-28 ENCOUNTER — Other Ambulatory Visit: Payer: Self-pay | Admitting: Physical Medicine and Rehabilitation

## 2024-03-28 MED ORDER — HYDROCODONE-ACETAMINOPHEN 5-325 MG PO TABS: 1.0000 | ORAL_TABLET | Freq: Three times a day (TID) | ORAL | 0 refills | Status: DC | PRN

## 2024-03-28 NOTE — Telephone Encounter (Signed)
 Please resend the Hydrocodone  to [facility]  on [address]. The prior has been cancelled.

## 2024-03-28 NOTE — Telephone Encounter (Signed)
 Patient is out of Hydrocodone  5-325 MG. She wanted to know if you will send it today? Patient wanted to know if she could take more than 3 tabs per day. [phone].

## 2024-03-29 ENCOUNTER — Telehealth: Payer: Self-pay

## 2024-03-29 NOTE — Telephone Encounter (Signed)
 PA for Nucynta  ER created in Cover My Meds.

## 2024-03-29 NOTE — Telephone Encounter (Signed)
 Nucynta  PA denied. Forms to Dr. Lorilee desk to complete appeal if needed.

## 2024-04-03 ENCOUNTER — Encounter: Payer: Self-pay | Admitting: Physical Medicine and Rehabilitation

## 2024-04-03 ENCOUNTER — Encounter: Attending: Physical Medicine and Rehabilitation | Admitting: Physical Medicine and Rehabilitation

## 2024-04-03 VITALS — BP 131/76 | HR 71 | Ht 63.0 in | Wt 151.6 lb

## 2024-04-03 DIAGNOSIS — G500 Trigeminal neuralgia: Secondary | ICD-10-CM | POA: Diagnosis not present

## 2024-04-03 NOTE — Progress Notes (Signed)
 Subjective:    Patient ID: [name], female    DOB: Mar 05, 1941, 83 y.o.   MRN: [id number]  HPI   [name] presents today for f/u of severe trigeminal neuralgia.   1) Severe trigeminal neuralgia: -some of the recent settings have been very good and cause no pain at night but then the next day the pain is worse than baseline -she has been given the choice of not having it on at night She has pain in the morning whether or not she turns the device on at night or not -pain continues to be severe -on the lower dose fentanyl  now- 25mcg- tolerating this well -Nucynta  has not been improved  -the gabapentin  continues to help -she had a program setting on her stimulator last week that was effective, she plans for follow up with [facility] about this -they have not been able to find anyone who does management of the pain pump- she still wants to get it done -she feels the medication is not helping and that the stimulator is not helping -several times she has turned off the stimulator and that was not enough -she does not get negative side effects from the Fentanyl  patch -she is not doing as well as she had hoped she would after visiting Dr. Colon -they had changed some programs and these had really helped while she was in [place] -she stayed with her son in [place]  after her trip to [place] and got to see her great grandson! But after she got home her pain got so much worse. She is not sure if the distraction of family helped her -she and [name] got really tried during the trip- the floors were slick in the shower -today pain is 8/10 -she needs refill of fentanyl  patch -mouth gets dry from gabapentin  -she is planning to see Dr. Colon at [facility] again -she went to [facility] 2 weeks ago -Friday or Saturday night it was time for her to have another patch and her husband felt she should go off the patch -she is doing well with hydrocodone  and fentanyl  patch -had been unable to go to  the gym when her pain was severe -she is not doing very well -they had to cancel one of the appointments due to snow -the next week [name] had the flu -she turned off the device for 2 weeks as it was making her pain worse -she had an appointment with [name] on Monday and tried new programs and they seemed to be working better -she is very happy with her new [facility] Physician but has not found any helpful settings yet -she turns off the stimulator at night as she can sleep well at night -if she turns off the stimulator during the day she has 1 hour of relief and then the old pain returns -the last two or three days she has had bad pain -she is filling out pain diaries -she was able to cook Thanksgiving dinner but felt a lot of pain the next day -she is doing ok -she likes her new physician at [facility]. He works a lot with the [employer] rep because the rep is just for back pain -he takes pictures and asks for the location to be adjusted -at [facility] she is just given one program and then it stops working.  -Saw Dr. Colon 8 weeks ago and the new programs worked but then it stops working when she returns home -she is Patent examiner Dr.  Lad on Thursday  -[name] researched red light therapy -she went to see the doctor in [place]  and was only been able to spend 5 minutes with her -they did about 15 minutes of adjustments and the new programs seemed to hurt more than they help -has had stimulator turned off since last night -was able to enjoy her birthday -she did well for 3 weeks but recently pain has been around 8-10 -when pain is very severe it makes her eye hurt -the way her stimulator is set it makes her nose hurt She is going back to [facility] on April 10th -she is managing with the gabapentin  and had to take 2 extra yesterday and it did not make her sleepier  -pain fluctuates  -she went to [place] last week with her children.  -she keeps a journal of when she  changes the program and what her pain level is in her teeth, eyes, and cheek. She put in not to use the teeth program -she said if they could hit the nerve between the two teeth that would be helpful -they have her an 11 and 12 in that region,  -she cannot get the right level of stimulation. Before she left she decreased it by 50%. Now she is on a cheat program and the lowest amount. It is helping the teeth but hurting the eye.  -Dr. Colon had a baby on 11/14 and she is not coming back until January.  -she talked to the representative -last night she turned off the stimulator -she slept almost day today and did not want to wake up for supper which was rare for her -she is having a difficult time weaning off the hydrocodone .  -she has been off of it in two days -she is expecting a return call from [name] team soon to discuss wean off fentanyl  -drove up on Tuesday and on Wednesday was supposed to have an appointment with pain psychologist. When they got there they were told that appointment had been changed to virtual. The next day they had a more sophisticated MRI. [name] did moving in MRI so MRI was not as helpful. The next day they saw Dr. Colon and discussed several procedural options and then went back to the motor stimulator- that is scheduled on October 16th.  -she recently went on vacation and had to spend the majority of the vacation in bed due to the severity of her pain -she stopped using the intranasal ketamine because although it helped initially, it appeared to make her pain worse later -she asks if she can wean off some medication as she had a recent appointment with Dr. Colon and was told she may have a lot of postoperative pain if she is taking so much pain medication. She would still like to do this and also experiences sedation from her medication. She asks how she would go about weaning her medications -she asks if she should wean off the gabapentin  since this makes her so  sleepy She asks if she should restart Topamax . -she was encouraged by her phone visit with Dr. Colon and plans to pursue surgery. She is going to [facility] next week for Brain MRI, neuropsych eval, and to discuss the risks and benefits of the procedure with Dr. Colon -pain was so severe in the past that she expressed suicidality to her husband Zell.  -she feels that none of her medications are helpful but she knows that when she tries to wean them her pain worsens.  -she asks  about refill of the compounding cream -her husband asks about referral to ketamine clinic for depression rather than pain -she went back to the dentist because it was urting between two teeth and he said there is nothing wrong with those teeth and it is the trigeminal nerve When she took a shower that usually seems to help with but was migrating around her face.  -appointment with neurology in July -Dr. Colon cancelled the zoom call in April but this was rescheduled to July.  -she finds that only the gabapentin  helps -she is not sure if the fentanyl  helps When she is feeling really bad she tries to take another one but she gets very sleepy and dizzy.  -she is taking 8 a day of 100mg  per day- she takes two gabapentin  and tylenol  and hydrocodone  in the morning. She takes another two at breakfast around 11, another 2 at lunch around 1:30pm.  -she had a recent root canal and pain has become much more severe after that -her pain is in the two teeth next to where she had the root canal -Oragel heps for a bout an hour.  -she tried increasing her Gabapentin  to 300mg  TID but she could not tolerate this. It made her too sleepy.  -she contacted Dr. Darlis and he recommended she pause wean of Fentanyl  patch and I agree with this -she took last patch last night. She still has 12.5mcg patch available -her husband asks whether she should take a higher dose of gabapentin  -the fentanyl  patch makes her very sleepy and so allows her to  sleep. It has also made her dizzier.  -the 50mcg fentanyl  patch did not provide more relief- she would like to wean off. Weaned to 25, then 12.59mcg, then off this week -referral was to [facility] and not to Dr. Kirke Solders she states and they need a new referral send to Dr. Solders -she is using the compound cream and this helps- she uses this twice per day -capsaicin  made it burn  -lamotrigine  makes her too sleepy and dizzy to come down the stairs by herself and she does not feel comfortable going to the shower -left eye more open than usual. -she does not feel she is getting relief from the fentanyl  patch -she feels that the procedure at [facility] is a last resort.  -Given darkening of skin and worsening eye lid closure, MRI brain stat obtained and shows new cyst that could be post-surgical. Recommended she go to ED given worsening dizziness and facial swelling as well. ED physician discussed case with NSGY and neurology and discharged home recommending follow-up with Dr. Dartha and Dr. Tarri. She was given IV Fentanyl  in the ED which helped and discharged on 12mcg Fentanyl  patch, which has not helped.  -discussed increasing Fentanyl  patch to 25mcg since she has not experienced any negative side effects, no respiratory depression, and her husband is agreeable. I have sent in new script to start Saturday, but insurance required prior auth, which was approved.  -tonight would be the third tay of the 25mcg Fentanyl  patch -she is still taking the Gabapentin  -she feels that when she lays down she is spinning.  -eye continues to appear more closed.  -Husband has called Dr. Sunnie and Dr. Teddi office to scheduled follow-up appointments and has appointments with both tomorrow.  -Will run out of Fentanyl  patch by Sunday -pain is worsening and nothing is providing relief -she asks whether she should go to ED -she has also noted dizziness and  is not sure if this is from being in  the MRI or from the [place] .  -morphine  was not effective and caused itching, sarna lotion did help wit this.  -hydrocodone  has been most effective thus far -nucynta  was not effective but she is not sure whether she gave it a fair trial -she has month appointments scheduled with Fidela and f/u with me in Feb -she used the Norco last night and this morning with good benefit and has started to feel the pain return this afternoon -she did find benefit from the compound cream- she continues to use this.  -She continues to have a sharp shooting pain doing around her eyelid and the tip teeth and in the top of her head.  -has a radiofrequency ablation treatment that helped but not completely -she is not having as much sharp knife-like pin like in her eye Her top teeth in her left law have been hurting really badly.  -she now takes the baclofen  with the gabapentin  and they seem to work well together.  -she had surgical procedure and feels worse -yesterday she took a half a tablet but this morning she took a whole one. She does fine with the hydrocodone  but a whole one makes her too sleepy.  -she is taking 2 100mg  Gabapentin  three times per day but it makes her dizzy and sleepy so she cannot funciton -the cold worsens her pain.  -The IV fentanyl  did help with the pain.  -her husband feels she should get one more dose of the 25mg  fentanyl . -she did get one week benefit from the steroid injection.  -she is doing terribly -she is miserable -she feels depressed due to the pain -she has considered taking the whole bottle of hydrocodone  because because she can't take this pain anymore.  -she started taking the Gabapentin  300mg  TID.  -she does find relief from the hydrocodone  -the pain is constant -she wonders if it is inflammation.  -she has not tried turmeric. Her son-in-law gave her a bottle and she couldn't stomach it.   2) Fibromyalgia: -She has been almost nonfunctional due to the severity of her  pain. It continues to be very severe.  -She would like to wean off the Gabapentin  as she feels at risk of falls -she is taking Topamax  BID. She felt this was helpful but made her unsteady on her feet.  -still using gabapentin  and this is helping.  -she feels so fatigued that she can only have a shower every other day.   3) Osteoporosis: -husband asks whether she should get a Prolia  shot.   4) Dizziness: -head was swimming -she felt like she was going to fall.  -worsening -feels like she is spinning, not the room  -She tried the Phenytoin  for 2 days and did not feel benefit so stopped it.   -she feels the gabapentin  aggravates this and she would love to wean off but knows it helps her.   -She feels very sleepy during the day.   -She has never tried Topirmate before.   5) Sore throat -developed stopping lamotrigine  since not helping.   Prior history:  Her pain continues to be in her left eye, forehead. She has not tried any topicals for pain. She uses capsaicin  for her back and knees.   Since last visit nothing we have tried has helped with her pain. She has tried to decrease her Gabapentin  dose but then her pain increased so she has resumed taking 4 Gabapentin  per day.  Her husband asks whether he should contact her surgeon regarding whether nerve may have become unclamped.   Discussed prolotherapy, trigger point injections, Baclofen , Clonazepam, Phenytoin  as other options we could try,=.  Pain continues to be excruciating and she states she is not sure how much longer she can live like this.   Prior history: She has having some nausea, lightheadedness and felt this may be related to her Gabapentin . She did not take any yesterday and then felt more sever pain in the evening. She also gets electrical shooting across her jaw.   She does not feel that she has cluster headaches as the pain has been so constant. She feels that she is getting worse. The oxygen made her feel relaxed  but it is not helping.   The Sarna lotion does help with the itching in her head.   She was willing to try the Amitriptyline  but it makes her too sleepy in the morning.   Her teeth and left eye incredibly hurt.   She feels that the Gabapentin  has been making her dizziness.   She asks about biofeedback.   The pain is 7/10 on average, similar to last time.   When the pain is severe it can make her depressed.    6) Macular degeneration:  Has been following with ophthalmology  7) Family illness: -she lost two nephews recently  8) Knee osteoarthritis: -had a cortisone shot and this really helped  9) Constipation: -she gets this from the hydrocdone -she has a BM every day and half- there is straining -[name] says diet is poor -she tries to eat a lot of fruit for breakfast  Pain Inventory Average pain 10 Pain Right Now 10 My pain is constant, tingling, sharp, dull, stabbing, aching, numbness, burning  In the last 24 hours, has pain interfered with the following? General activity 10 Relation with others 5 Enjoyment of life 8  What TIME of day is your pain at its worst? Morning, daytime, evening, night Sleep (in general) Good  Pain is worst with  cold air, bending, damp air  Pain improves with heat, medication   Relief with medication is  4   Family History  Problem Relation Age of Onset   Heart disease Mother    Heart disease Father    Stroke Father    Stroke Sister    Asthma Daughter    Colon cancer Neg Hx    Social History   Socioeconomic History   Marital status: Married    Spouse name: Not on file   Number of children: 2   Years of education: college   Highest education level: Not on file  Occupational History   Occupation: housewife    Employer: RETIRED  Tobacco Use   Smoking status: Never   Smokeless tobacco: Never  Vaping Use   Vaping status: Never Used  Substance and Sexual Activity   Alcohol use: No    Alcohol/week: 0.0 standard drinks  of alcohol   Drug use: No   Sexual activity: Not on file  Other Topics Concern   Not on file  Social History Narrative   1 cup of Jasmine tea daily    Social Drivers of Health   Financial Resource Strain: Low Risk  (01/16/2024)   Received from [facility]   Overall Financial Resource Strain (CARDIA)    Difficulty of Paying Living Expenses: Not hard at all  Food Insecurity: No Food Insecurity (01/16/2024)   Received from [facility]  Hunger Vital Sign    Within the past 12 months, you worried that your food would run out before you got the money to buy more.: Never true    Within the past 12 months, the food you bought just didn't last and you didn't have money to get more.: Never true  Transportation Needs: No Transportation Needs (01/16/2024)   Received from [facility] - Transportation    In the past 12 months, has lack of transportation kept you from medical appointments or from getting medications?: No    Lack of Transportation (Non-Medical): No  Physical Activity: Inactive (11/13/2019)   Received from [facility] visits prior to 08/22/2022.   Exercise Vital Sign    On average, how many days per week do you engage in moderate to strenuous exercise (like a brisk walk)?: 0 days    On average, how many minutes do you engage in exercise at this level?: 0 min  Stress: No Stress Concern Present (11/13/2019)   Received from [facility] visits prior to 08/22/2022.   [employer] of Occupational Health - Occupational Stress Questionnaire    Feeling of Stress : Not at all  Social Connections: Socially Integrated (11/13/2019)   Received from [facility] visits prior to 08/22/2022.   Social Connection and Isolation Panel    In a typical week, how many times do you talk on the phone with family, friends, or neighbors?: More than three times a week    How often do you  get together with friends or relatives?: More than three times a week    How often do you attend church or religious services?: 1 to 4 times per year    Do you belong to any clubs or organizations such as church groups, unions, fraternal or athletic groups, or school groups?: Yes    How often do you attend meetings of the clubs or organizations you belong to?: 1 to 4 times per year    Are you married, widowed, divorced, separated, never married, or living with a partner?: Married   Past Surgical History:  Procedure Laterality Date   CHOLECYSTECTOMY  03/2010   COLONOSCOPY  2009   Dr. Author    CYSTOSCOPY     Multiple Cystoscopies with stents or biopsy    ESOPHAGOGASTRODUODENOSCOPY  10/2013   EYE SURGERY     bilateral cataract removal   Gamma knife radiation  01/2018   for trigeminal neuralgia   REFRACTIVE SURGERY Bilateral 2022   RHIZOTOMY Left 04/28/2021   Procedure: Left Percutaneous trigeminal balloon rhizotomy;  Surgeon: Cheryle Debby LABOR, MD;  Location[facility] OR;  Service: Neurosurgery;  Laterality: Left;   TONSILLECTOMY     Past Surgical History:  Procedure Laterality Date   CHOLECYSTECTOMY  03/2010   COLONOSCOPY  2009   Dr. Author    CYSTOSCOPY     Multiple Cystoscopies with stents or biopsy    ESOPHAGOGASTRODUODENOSCOPY  10/2013   EYE SURGERY     bilateral cataract removal   Gamma knife radiation  01/2018   for trigeminal neuralgia   REFRACTIVE SURGERY Bilateral 2022   RHIZOTOMY Left 04/28/2021   Procedure: Left Percutaneous trigeminal balloon rhizotomy;  Surgeon: Cheryle Debby LABOR, MD;  Location: [facility] OR;  Service: Neurosurgery;  Laterality: Left;   TONSILLECTOMY     Past Medical History:  Diagnosis Date   Adenomatous colon polyp 03/24/2018   Allergy    Arthritis  Cataract    Chronic headaches    Depression    Fibromyalgia    Gallstones    GERD (gastroesophageal reflux disease)    Hyperlipemia    Hypertension    Interstitial cystitis     Lactose intolerance    LBBB (left bundle branch block)    Osteoarthritis    Osteopenia    Pneumonia    Thyroid  disease    Tinnitus    Trigeminal neuralgia    BP 131/76 (BP Location: Left Arm, Patient Position: Sitting, Cuff Size: Normal)   Pulse 71   Ht 5' 3 (1.6 m)   Wt 151 lb 9.6 oz (68.8 kg)   SpO2 96%   BMI 26.85 kg/m   Opioid Risk Score:   Fall Risk Score:  `1  Depression screen [facility] 2/9     04/03/2024   10:20 AM 02/08/2024   11:35 AM 11/19/2023   10:10 AM 10/05/2023    9:18 AM 05/25/2023   11:27 AM 02/23/2023   11:21 AM 12/28/2022    1:35 PM  Depression screen PHQ 2/9  Decreased Interest 0 0 0 0 0 0 0  Down, Depressed, Hopeless 1 0 0 0 0 0 0  PHQ - 2 Score 1 0 0 0 0 0 0  Altered sleeping 0  0      Tired, decreased energy 0  0      Change in appetite 0        Feeling bad or failure about yourself  0  0      Trouble concentrating 0  0      Moving slowly or fidgety/restless 0  0      Suicidal thoughts 0  0      PHQ-9 Score 1  0      Difficult doing work/chores Somewhat difficult          Review of Systems  Constitutional: Negative.   HENT: Negative.    Eyes:  Positive for visual disturbance.  Respiratory: Negative.    Cardiovascular: Negative.   Gastrointestinal: Negative.   Endocrine: Negative.   Genitourinary: Negative.   Musculoskeletal: Negative.        Pain in left side of face  Skin: Negative.   Allergic/Immunologic: Negative.   Neurological:  Positive for dizziness, tremors, light-headedness and numbness.       Tingling   Hematological: Negative.   Psychiatric/Behavioral: Negative.    All other systems reviewed and are negative.      Objective:   Physical Exam PRIOR EXAM: Gen: no distress, normal appearing HEENT: oral mucosa pink and moist, NCAT Cardio: Reg rate Chest: normal effort, normal rate of breathing Abd: soft, non-distended Ext: no edema Psych: pleasant, normal affect Skin: intact Neuro: Alert and oriented x3, left eye drop and  facial droop- stable . Assessment & Plan:  Mrs. Krolikowski is an 83 year old woman who presents for f/u of left sided trigeminal neuralgia and dizziness.    1) Sensory deafferation pain, left sided trigeminal neuralgia vs cluster headaches (she does have lacrimation and runny nose on that side). -advised leaving the device off at night since it does not appear to be helping -discussed that pain was so severe at one point that she did not want to live -discussed that her pain continues to be severe, advised continuing gabapentin  to 200mg  TD, discussed that she needs refill of Fentanyl  patch 25mcg- sent 10 patches, discussed that higher doses of gabapentin  cause dizziness and higher doses of fentanyl  patch make  her sleepy -discussed that Journavx could help -discussed that seeing the water on the beach caused dizziness -decrease fentanyl  patch to 25mcg -discussed that insurance company rejected Nucynta , discussed potential costs of these -discussed that she has been happy with her new provider at [facility] -discussed that she is following up with Dr. Colon at [facility] -refilled fentanyl , discussed that she tried stopping this and pain was very severe -avoid proceed foods, eggs, gluten, dairy -recommended eating furits and vegetables -continue hydrocodone , discussed that she feel she needs either a higher dose or frequency, discussed that she has become somewhat more tolerant to the medication than she was 15 years ago -discussed that gabapentin  used to make her to lean to the left but now it makes her lean to the right -discussed that the gabapentin  makes her sleepy -discussed that she feels that the hydrocodone  is more effective for her than the gabapentin  -discussed [name] recommendation for an intrathecal pain pump, discussed that she has a trial scheduled for the pump, discussed that she is considering this at [facility] -encouraged discussing with with the [facility] team risks and benefits of this  procedure -discussed that she is doing Zoom calls with a psychiatrist -d/c narcan  and naltrexone, discussed that she uses the hydrocodone  sparingly.  -discussed that she had new programs installed on Monday -discussed that she is doing well with the hydrocodone  -discussed pruritus with the fentanyl  patch -continue following with pain psychologist, discussed that I think it is phenomenal that she is doing this -refilled hydrocodone  and fentanyl  -discussed rating her pain for the programming of electrical stimulation   -discussed mechanism of action of low dose naltrexone as an opioid receptor antagonist which stimulates your body's production of its own natural endogenous opioids, helping to decrease pain. Discussed that it can also decrease T cell response and thus be helpful in decreasing inflammation, and symptoms of brain fog, fatigue, anxiety, depression, and allergies. Discussed that this medication needs to be compounded at a compounding pharmacy and can more expensive. Discussed that I usually start at 1mg  and if this is not providing enough relief then I titrate upward on a monthly basis.    -discussed that savella  caused hallucinations -discussed that we tried lyrica  and we cannot recall but she had some side effect -discussed that topamax  did not help and made her feel worse -discussed that carbamazpeine did not help -discussed that she is happy that she does not have to return to [place] as those trips caused her a lot of fatigue -discussed that pain was bad after she cooked for Thanksgiving -discussed her follow-up with [name] -discussed the negative experience when she last went to [place]  -discussed that next available appointment was on June 24th or 26th, but they were able to move this up to May 22nd or 24th -discussed that she keeps a journal with the setting with the level of her pain.  -continue Diclofenac 3%, Gabapentin  5%, Lidocaine  5%, Menthol 1% compounded at [facility]:  apply 1-2 pumps three to four times per day as needed, discussed that this dose help -discussed that she has had to turn off the stimulator more since she has established care at [facility] -discussed response to her stimulator -discussed doubling the gabapentin  when pain is severe Increase low dose naltrexone to 3mg  HS -discussed her response to the stimulator She does have a history of migraines.  -Discussed that if she has to be in bed all day due to her pain it may be beneficial  for her husband to give her at least one hydrocodone  per day to enable her to function -provided refill of Fentanyl  patch as she will be leaving for her surgery in [place] soon and her husband plans to stay there for some time in case she has any surgical complications.  -discussed her follow-up with pain psychology.  -recommended weaning off Fentanyl , discussed stopping fentanyl  because she is on 12.5mg  today but she had tried this a couple of weeks ago and pain was so severe that she needed patch to be replaced.  -discussed that she is taking hydrocodone  4 tablets per day, and recommend to decrease this to 3 tabs per day for three days and then to keep decreasing the tab by once per day.  -discussed that skipping the morning dose of hydrocodone  will at least allow her to sleep -recommended trying Frankincense and Myrrh essential oils mixed in coconut oil and applied to area of pain -recommended restarting Topamax  -called [facility] to prescribe compounding gel refill -continue Gabapentin  100mg  up to 8 times a day, discussed her current schedule of takin -discussed using Oragel multiple times per ay since this helps for an hour, use the topical cream intermittently in between -continue follow-up with [facility] to set up an appointment for her with them. Unfortunately Dr. Zulema has retired but was able to set her up with Dr. Binnie who specializes in migraines and trigeminal neuralgia on July  25th. Let patient and husband know. They will mail her their address.   -commended her for calling Dr. Darlis as well to let him know how she is feeling -recommended taking 1 hydrocodone  now for the severe pain, and discussed that the Fentanyl  patch with take 12-24 hours to kick in -discussed with April the neurology referral and she did specify Dr. Kirke Zulema and spoke with the [facility] department -discussed CBD oil, her son bought her some of this and she felt that it greatly helped  Recommend topical CBD oil- discussed its benefits in reducing inflammation, pain, insomnia, and anxiety, but she stopped because effect wore of.  -Discussed that CBD oil differs from marijuana in that it does not contain THC- the substance that causes euphoria.  -Discussed that it is made from the hemp plant.  -It has been used for thousands of years -preliminary research suggests that if may be able to shrink cancerous tumors, stop plaque formation in Alzheimer's Disease, and slow the progress of brain disease from concussions.  -Additional benefits that have been demonstrated in studies include improved nausea, indigestion, and brain health, and reduced seizures.  -In a survey 92% of patients who tried medical cannabis felt it improved symptoms such as chronic pain, arthritis, migraines, and cancer.   -Provided with a pain relief journal and discussed that it contains foods and lifestyle tips to naturally help to improve pain. Discussed that these lifestyle strategies are also very good for health unlike some medications which can have negative side effects. Discussed that the act of keeping a journal can be therapeutic and helpful to realize patterns what helps to trigger and alleviate pain.   -discussed with Dr. Saul trigeminal deafferation pain, discussed the difference between this and trigeminal neuralgia with patient and husband, discussed motor cortex stimulation. -discussed whether or not to  continue fentanyl  patch, husband does feel that the IV fentanyl   -encouraged following with Dr. Sweet/Dr. Cleotilde at [facility] to try procedure recommended by D. Ostegard.  -discussed mechanism of action of low dose naltrexone as  an opioid receptor antagonist which stimulates your body's production of its own natural endogenous opioids, helping to decrease pain. Discussed that it can also decrease T cell response and thus be helpful in decreasing inflammation, and symptoms of brain fog, fatigue, anxiety, depression, and allergies. Discussed that this medication needs to be compounded at a compounding pharmacy and can more expensive. Discussed that I usually start at 1mg  and if this is not providing enough relief then I titrate upward on a monthly basis.   -stop lamotrigine  as it is making her dizzy without benefit -reviewed patient's medical notes -discussed her progress with getting a referral to [facility] -recommended going to ED given darkening of skin, worsening eye closure, finding of cyst, dizziness to receive prompt neurosurgical eval regarding if surgery is necessary, and also for pain management given worsening pain refractory to all treatments and resulting suicidality. Reviewed ED doc notes- he discussed with on-call neurology and NSGY and determined that patient could be discharged with outpatient follow-up. IV fentanyl  was administered with relief to patient. She was discharged on fentanyl  patch which has not provided relief. She is getting 12mcg- no side effects- discussed increasing to 25mcg and her husband is in agreement. Prior auth completed and approved. Discussed with husband starting patch on Satruday after she has worn 12mcg patch for 72 hours -encouraged follow-up with Dr. Saul, husband let me know appointment has been set up 1/18.  -recommended follow-up with Dr. 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  -Discussed that turmeric  -discussed steroid taper. -continue savella , husband feels like it is helping with her suicidal thoughts. Increase to 25mg .  Discontinued hydrocodone   -discuss mouth guard with dentist.  -d/c topamax  -she does have another procedure scheduled. -refilled Norco -follow-up monthly with Fidela and with me in February, placed on waitlist for earlier appointment with me, advised phone visits/MyChart are a great way to communicate in the interim -discussed that she can take an additional one or two tylenol  for breakthrough pain -recommended checking CMP for liver enzymes with next labs -prescribed NAC 600mg  BID to protect the liver while taking tylenol , discussed its health benefits for mitochondrial function -d/c Baclofen   -f/u with Dr. Saul and Dr. Darlis 1/18 -discussed retrying carbamazepine  to see if it is effective for her. It is $89 for her, so we discussed trying Lamictal  instead.  -encouraged anti-inflammatory diet.  -she had sinusitis that was viral and was treated with antibiotics. This was in the early 90s. The symptoms resolved after 2 months. She uses to get sinus infections and saline rinse.  -She has had multiple root canals.  -Discontinue 100% oxygen via facemask as this was not helpful.  -Discussed that unfortunately Sprint PNS is not indicated for craniofacial pain. -Amitriptyline  and Lyrica  did not help.  -Continue Cymbalta  60mg .  -Topiramate  helped but caused dizziness.  -Provided with hand out with information regarding trigeminal neuralgia -Failed Penytoin.  -trial lidocaine  patch -She would like to try biofeedback. -reviewed neurology note.  -referred to Dr. Kirke Solders.  -will try  trigger point injections next visit.  -Continue Ketamine 10%, Baclofen  2%, Cyclobenzaprine 2%, Ketoprofen 10%, Gabapentin  6%, Bupivacaine 1%, Amitryptiline 5% to apply to painful areas- using 3-4 times per day.  -Can consider  Meloxicam if Phenytoin  does not help.  -Discussed Qutenza  as an option for neuropathic pain control. Discussed that this is a capsaicin  patch, stronger than capsaicin  cream. Discussed that it is currently approved for diabetic peripheral neuropathy and post-herpetic neuralgia, but that it has also shown benefit in treating other forms of neuropathy. Provided patient with link to site to learn more about the patch: https://www.qutenza .[email or web address]/. Discussed that the patch would be placed in office and benefits usually last 3 months. Discussed that unintended exposure to capsaicin  can cause severe irritation of eyes, mucous membranes, respiratory tract, and skin, but that Qutenza  is a local treatment and does not have the systemic side effects of other nerve medications. Discussed that there may be pain, itching, erythema, and decreased sensory function associated with the application of Qutenza . Side effects usually subside within 1 week. A cold pack of analgesic medications can help with these side effects. Blood pressure can also be increased due to pain associated with administration of the patch.  -she cut down on her gabapentin  due to her dizziness    Turmeric to reduce inflammation--can be used in cooking or taken as a supplement.  Benefits of turmeric:  -Highly anti-inflammatory  -Increases antioxidants  -Improves memory, attention, brain disease  -Lowers risk of heart disease  -May help prevent cancer  -Decreases pain  -Alleviates depression  -Delays aging and decreases risk of chronic disease  -Consume with black pepper to increase absorption    Turmeric Milk Recipe:  1 cup milk  1 tsp turmeric  1 tsp cinnamon  1 tsp grated ginger (optional)  Black  pepper (boosts the anti-inflammatory properties of turmeric).  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  -she is a good metabolizer of amitriptyline  and has tolerated nortrypilline in the past, but without much benefit.    3) CKD: encouraged 6-8 glasses of water per day. She states that she had AKI with Ibuprofen in the past. Discussed checking Cr level next visit if we are considering Meloxicam -Discussed that Nucynta , and other pain medications, may stay in her bloodstream longer given her CKD   4) Hypernatremia: encouraged hydration   5) General health and pain: provided with list of healthy foods that are both nutritious and fight pain.   6) Depressed: Amitriptyline  did help with this but she could not tolerate the drowsiness. Encouraged focusing on the factors that she can control, such as anti-inflammatory diet.   7) Fibromyalgia: Discussed goal to wean steroids prior to surgery. Continue 1mg  daily. She is on that intermittently. She started this again about a month ago. She has been on this about a year. -avoid gluten/dairy -continue gabapentin  -recommended eating a lot of fruits and vegetables -recommended avoiding eggs, gluten, dairy, processed foods Continue Savella  -discussed benefits of cold water showers.  -continue gabapentin .  Continue fentanyl  and hydrocodone  -avoid processed foods -avoid  gluten, dairy, and eggs -eat fruits, vegetables, spices, and herbs -Discussed current symptoms of pain and history of  pain.  -Discussed benefits of exercise in reducing pain. -Discussed following foods that may reduce pain: 1) Ginger (especially studied for arthritis)- reduce leukotriene production to decrease inflammation 2) Blueberries- high in phytonutrients that decrease inflammation 3) Salmon- marine omega-3s reduce joint swelling and pain 4) Pumpkin seeds- reduce inflammation 5) dark chocolate- reduces inflammation 6) turmeric- reduces inflammation 7) tart cherries - reduce pain and stiffness 8) extra virgin olive oil - its compound olecanthal helps to block prostaglandins  9) chili peppers- can be eaten or applied topically via capsaicin  10) mint- helpful for headache, muscle aches, joint pain, and itching 11) garlic- reduces inflammation 12) Green tea- reduces inflammation and oxidative stress, helps with weight loss, may reduce the risk of cancer, recommend Double [employer] of Tea daily  Link to further information on diet for chronic pain: [email or web address]/   8) Dizziness: -discussed that this could be from the combination of gabapentin  and baclofen .  -discussed that all the medications we are trying for pain may worsen the dizziness  9) Constipation:  -Provided list of following foods that help with constipation and highlighted a few: 1) prunes- contain high amounts of fiber.  2) apples- has a form of dietary fiber called pectin that accelerates stool movement and increases beneficial gut bacteria 3) pears- in addition to fiber, also high in fructose and sorbitol which have laxative effect 4) figs- contain an enzyme ficin which helps to speed colonic transit 5) kiwis- contain an enzyme actinidin that improves gut motility and reduces constipation 6) oranges- rich in pectin (like apples) 7)  grapefruits- contain a flavanol naringenin which has a laxative effect 8) vegetables- rich in fiber and also great sources of folate, vitamin C, and K 9) artichoke- high in inulin, prebiotic great for the microbiome 10) chicory- increases stool frequency and softness (can be added to coffee) 11) rhubarb- laxative effect 12) sweet potato- high fiber 13) beans, peas, and lentils- contain both soluble and insoluble fiber 14) chia seeds- improves intestinal health and gut flora 15) flaxseeds- laxative effect 16) whole grain rye bread- high in fiber 17) oat bran- high in soluble and insoluble fiber 18) kefir- softens stools -recommended to try at least one of these foods every day.  -drink 6-8 glasses of water per day -walk regularly, especially after meals.   10) Osteopenia - was tapered off the prednisone  -discussed that currently her pain may be more of a risk to her than osteopenia given her suicidal ideation from her pain -discussed risk of fall with the medications that make her dizziness.   11) Daytime sleepiness -discussed that this is secondary to her medications.  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  -discussed red light therapy  15) HTN: -discussed clonidine  patch could potentially help with HTN and pain.  -BP is 170/90 today.  -Advised checking BP daily at home and logging results to bring into follow-up appointment with PCP and myself. -Reviewed BP meds today.  -Advised regarding healthy foods that can help lower blood pressure and provided with a list: 1) citrus foods- high in vitamins and minerals 2) salmon and other fatty fish - reduces inflammation and oxylipins 3) swiss chard (leafy green)- high level of nitrates 4) pumpkin seeds- one of the best natural  sources of magnesium 5) Beans and lentils- high in fiber, magnesium, and potassium  6) Berries- high in flavonoids 7) Amaranth (whole grain, can be cooked similarly to rice and oats)- high in magnesium and fiber 8) Pistachios- even more effective at reducing BP than other nuts 9) Carrots- high in phenolic compounds that relax blood vessels and reduce inflammation 10) Celery- contain phthalides that relax tissues of arterial walls 11) Tomatoes- can also improve cholesterol and reduce risk of heart disease 12) Broccoli- good source of magnesium, calcium , and potassium 13) Greek yogurt: high in potassium and calcium  14) Herbs and spices: Celery seed, cilantro, saffron, lemongrass, black cumin, ginseng, cinnamon, cardamom, sweet basil, and ginger 15) Chia and flax seeds- also help to lower cholesterol and blood sugar 16) Beets- high levels of nitrates that relax blood vessels  17) spinach and bananas- high in potassium  -Provided lise of supplements that can help with hypertension:  1) magnesium: one high quality brand is Bi[employer]emizers since it contains all 7 types of magnesium, otherwise over the counter magnesium gluconate 400mg  is a good option 2) B vitamins 3) vitamin D  4) potassium 5) CoQ10 6) L-arginine 7) Vitamin C 8) Beetroot -Educated that goal BP is 120/80. -Made goal to incorporate some of the above foods into diet.    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  -discussed that she had weaned off the steroids and had been doing fine off the steroids -discussed that aquatic therapy can help -discussed that she should encourage follow-up with rheumatology to assess for giant cell arteritis -[facility] -continue 2mg  steroids  16. Sciatica: -consider red light therapy -discussed that this has improved -discussed aquatherapy -discussed that the spinal injection helped temporarily -discussed that she has been taking 4mg   of low dose naltrexone -discussed that she used some hydrocodone  she had left over from the past.  -discussed lumbosacral orthosis, advised using for activities that involve bending/twisting/walking on uneven terrain -discussed that it would probably be better to repeat steroid injection than to continue hydrocodone  -advised to use lidocaine  patch -continue capsaicin  cream  17. Family illness: -discussed that she lost 2 nephews recently  5. Depression:  -continue cymbalta  -encouraged follow-up with psychiatry   19. Dizziness: -discussed that this has improved  20. 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   22. Constipation: -discussed that fentanyl  and hydrocodone  can cause this Recommended eating an apple every day Recommended eating prunes every day -discussed that eating an apple per day and prune juice at night help, discussed that prune juice helps more than miralax 

## 2024-04-10 DIAGNOSIS — H353123 Nonexudative age-related macular degeneration, left eye, advanced atrophic without subfoveal involvement: Secondary | ICD-10-CM | POA: Diagnosis not present

## 2024-04-14 ENCOUNTER — Other Ambulatory Visit: Payer: Self-pay | Admitting: Physical Medicine and Rehabilitation

## 2024-04-14 MED ORDER — FENTANYL 25 MCG/HR TD PT72: 1.0000 | MEDICATED_PATCH | TRANSDERMAL | 0 refills | Status: DC

## 2024-04-18 ENCOUNTER — Other Ambulatory Visit: Payer: Self-pay | Admitting: Physical Medicine and Rehabilitation

## 2024-04-18 MED ORDER — TAPENTADOL HCL ER 50 MG PO TB12: 50.0000 mg | ORAL_TABLET | Freq: Two times a day (BID) | ORAL | 0 refills | Status: DC

## 2024-04-18 MED ORDER — FENTANYL 12 MCG/HR TD PT72: 1.0000 | MEDICATED_PATCH | TRANSDERMAL | 0 refills | Status: DC

## 2024-04-19 ENCOUNTER — Other Ambulatory Visit (HOSPITAL_COMMUNITY): Payer: Self-pay

## 2024-04-19 ENCOUNTER — Telehealth: Payer: Self-pay

## 2024-04-19 ENCOUNTER — Other Ambulatory Visit: Payer: Self-pay | Admitting: Physical Medicine and Rehabilitation

## 2024-04-19 MED ORDER — FENTANYL 12 MCG/HR TD PT72: 1.0000 | MEDICATED_PATCH | TRANSDERMAL | 0 refills | Status: DC

## 2024-04-19 NOTE — Telephone Encounter (Signed)
 SABRA

## 2024-04-19 NOTE — Telephone Encounter (Signed)
 The Fentanyl   12 mcg patch has been cancelled. Per pharmacy they only have the 25 MCG patch dose. Please re-send or advise.  Call back phone [phone].

## 2024-04-20 ENCOUNTER — Telehealth: Payer: Self-pay

## 2024-04-20 NOTE — Telephone Encounter (Addendum)
 [name] called:   Please send Nucynta  to [facility]. Per [name] its cheaper there. [facility] script has been cancelled).

## 2024-04-21 ENCOUNTER — Other Ambulatory Visit: Payer: Self-pay | Admitting: Physical Medicine and Rehabilitation

## 2024-04-21 ENCOUNTER — Other Ambulatory Visit (HOSPITAL_COMMUNITY): Payer: Self-pay

## 2024-04-21 DIAGNOSIS — G509 Disorder of trigeminal nerve, unspecified: Secondary | ICD-10-CM | POA: Diagnosis not present

## 2024-04-21 DIAGNOSIS — Z9682 Presence of neurostimulator: Secondary | ICD-10-CM | POA: Diagnosis not present

## 2024-04-21 MED ORDER — TAPENTADOL HCL ER 50 MG PO TB12
50.0000 mg | ORAL_TABLET | Freq: Two times a day (BID) | ORAL | 0 refills | Status: DC
  Filled 2024-04-21 – 2024-04-22 (×2): qty 60, 30d supply, fill #0

## 2024-04-22 ENCOUNTER — Other Ambulatory Visit (HOSPITAL_COMMUNITY): Payer: Self-pay

## 2024-04-24 ENCOUNTER — Encounter: Payer: Self-pay | Admitting: Radiology

## 2024-04-24 ENCOUNTER — Other Ambulatory Visit (HOSPITAL_COMMUNITY): Payer: Self-pay

## 2024-04-25 ENCOUNTER — Other Ambulatory Visit (HOSPITAL_COMMUNITY): Payer: Self-pay

## 2024-04-27 DIAGNOSIS — Z79899 Other long term (current) drug therapy: Secondary | ICD-10-CM | POA: Diagnosis not present

## 2024-05-01 ENCOUNTER — Other Ambulatory Visit: Payer: Self-pay | Admitting: Physical Medicine and Rehabilitation

## 2024-05-01 MED ORDER — HYDROCODONE-ACETAMINOPHEN 5-325 MG PO TABS: 1.0000 | ORAL_TABLET | Freq: Three times a day (TID) | ORAL | 0 refills | Status: DC | PRN

## 2024-05-04 DIAGNOSIS — E039 Hypothyroidism, unspecified: Secondary | ICD-10-CM | POA: Diagnosis not present

## 2024-05-04 DIAGNOSIS — G500 Trigeminal neuralgia: Secondary | ICD-10-CM | POA: Diagnosis not present

## 2024-05-04 DIAGNOSIS — R232 Flushing: Secondary | ICD-10-CM | POA: Diagnosis not present

## 2024-05-04 DIAGNOSIS — N1831 Chronic kidney disease, stage 3a: Secondary | ICD-10-CM | POA: Diagnosis not present

## 2024-05-08 ENCOUNTER — Encounter: Attending: Physical Medicine and Rehabilitation | Admitting: Physical Medicine and Rehabilitation

## 2024-05-08 ENCOUNTER — Encounter: Payer: Self-pay | Admitting: Physical Medicine and Rehabilitation

## 2024-05-08 VITALS — BP 119/72 | HR 77 | Ht 63.0 in | Wt 150.0 lb

## 2024-05-08 DIAGNOSIS — G500 Trigeminal neuralgia: Secondary | ICD-10-CM | POA: Insufficient documentation

## 2024-05-08 DIAGNOSIS — G629 Polyneuropathy, unspecified: Secondary | ICD-10-CM | POA: Diagnosis present

## 2024-05-08 DIAGNOSIS — R739 Hyperglycemia, unspecified: Secondary | ICD-10-CM | POA: Diagnosis present

## 2024-05-08 DIAGNOSIS — M069 Rheumatoid arthritis, unspecified: Secondary | ICD-10-CM | POA: Diagnosis present

## 2024-05-08 MED ORDER — FENTANYL 25 MCG/HR TD PT72: 1.0000 | MEDICATED_PATCH | TRANSDERMAL | 0 refills | Status: DC

## 2024-05-08 MED ORDER — HYDROCODONE-ACETAMINOPHEN 5-325 MG PO TABS: 1.0000 | ORAL_TABLET | Freq: Three times a day (TID) | ORAL | 0 refills | Status: DC | PRN

## 2024-05-08 NOTE — Progress Notes (Signed)
 Subjective:    Patient ID: [name], female    DOB: 02-02-1941, 83 y.o.   MRN: [id number]  Medication Refill Associated symptoms include numbness.     [name] presents today for f/u of severe trigeminal neuralgia.   1) Severe trigeminal neuralgia: -Nucynta  seemed to make her worse, especially in her teeth -some of the recent settings have been very good and cause no pain at night but then the next day the pain is worse than baseline -she has been given the choice of not having it on at night She has pain in the morning whether or not she turns the device on at night or not -pain continues to be severe -on the lower dose fentanyl  now- 25mcg- tolerating this well -Nucynta  has not been improved  -the gabapentin  continues to help -she had a program setting on her stimulator last week that was effective, she plans for follow up with [facility] about this -they have not been able to find anyone who does management of the pain pump- she still wants to get it done -she feels the medication is not helping and that the stimulator is not helping -several times she has turned off the stimulator and that was not enough -she does not get negative side effects from the Fentanyl  patch -she is not doing as well as she had hoped she would after visiting Dr. Colon -they had changed some programs and these had really helped while she was in [place] -she stayed with her son in [place]  after her trip to [place] and got to see her great grandson! But after she got home her pain got so much worse. She is not sure if the distraction of family helped her -she and [name] got really tried during the trip- the floors were slick in the shower -today pain is 8/10 -she needs refill of fentanyl  patch -mouth gets dry from gabapentin  -she is planning to see Dr. Colon at [facility] again -she went to [facility] 2 weeks ago -Friday or Saturday night it was time for her to have another patch and her husband  felt she should go off the patch -she is doing well with hydrocodone  and fentanyl  patch -had been unable to go to the gym when her pain was severe -she is not doing very well -they had to cancel one of the appointments due to snow -the next week [name] had the flu -she turned off the device for 2 weeks as it was making her pain worse -she had an appointment with [name] on Monday and tried new programs and they seemed to be working better -she is very happy with her new [facility]ician but has not found any helpful settings yet -she turns off the stimulator at night as she can sleep well at night -if she turns off the stimulator during the day she has 1 hour of relief and then the old pain returns -the last two or three days she has had bad pain -she is filling out pain diaries -she was able to cook Thanksgiving dinner but felt a lot of pain the next day -she is doing ok -she likes her new physician at [facility]. He works a lot with the [employer] rep because the rep is just for back pain -he takes pictures and asks for the location to be adjusted -at [facility] she is just given one program and then it stops working.  -Saw Dr. Colon 8 weeks ago and the  new programs worked but then it stops working when she returns home -she is seeing neurosurgeon [name] on Thursday  [name] researched red light therapy -she went to see the doctor in [place]  and was only been able to spend 5 minutes with her -they did about 15 minutes of adjustments and the new programs seemed to hurt more than they help -has had stimulator turned off since last night -was able to enjoy her birthday -she did well for 3 weeks but recently pain has been around 8-10 -when pain is very severe it makes her eye hurt -the way her stimulator is set it makes her nose hurt She is going back to [facility] on April 10th -she is managing with the gabapentin  and had to take 2 extra yesterday and it did not make her  sleepier  -pain fluctuates  -she went to [place] last week with her children.  -she keeps a journal of when she changes the program and what her pain level is in her teeth, eyes, and cheek. She put in not to use the teeth program -she said if they could hit the nerve between the two teeth that would be helpful -they have her an 11 and 12 in that region,  -she cannot get the right level of stimulation. Before she left she decreased it by 50%. Now she is on a cheat program and the lowest amount. It is helping the teeth but hurting the eye.  -Dr. Colon had a baby on 11/14 and she is not coming back until January.  -she talked to the representative -last night she turned off the stimulator -she slept almost day today and did not want to wake up for supper which was rare for her -she is having a difficult time weaning off the hydrocodone .  -she has been off of it in two days -she is expecting a return call from [name] team soon to discuss wean off fentanyl  -drove up on Tuesday and on Wednesday was supposed to have an appointment with pain psychologist. When they got there they were told that appointment had been changed to virtual. The next day they had a more sophisticated MRI. [name] did moving in MRI so MRI was not as helpful. The next day they saw Dr. Colon and discussed several procedural options and then went back to the motor stimulator- that is scheduled on October 16th.  -she recently went on vacation and had to spend the majority of the vacation in bed due to the severity of her pain -she stopped using the intranasal ketamine because although it helped initially, it appeared to make her pain worse later -she asks if she can wean off some medication as she had a recent appointment with Dr. Colon and was told she may have a lot of postoperative pain if she is taking so much pain medication. She would still like to do this and also experiences sedation from her medication. She asks how she  would go about weaning her medications -she asks if she should wean off the gabapentin  since this makes her so sleepy She asks if she should restart Topamax . -she was encouraged by her phone visit with Dr. Colon and plans to pursue surgery. She is going to [facility] next week for Brain MRI, neuropsych eval, and to discuss the risks and benefits of the procedure with Dr. Colon -pain was so severe in the past that she expressed suicidality to her husband Zell.  -she feels that none of her medications are  helpful but she knows that when she tries to wean them her pain worsens.  -she asks about refill of the compounding cream -her husband asks about referral to ketamine clinic for depression rather than pain -she went back to the dentist because it was urting between two teeth and he said there is nothing wrong with those teeth and it is the trigeminal nerve When she took a shower that usually seems to help with but was migrating around her face.  -appointment with neurology in July -Dr. Colon cancelled the zoom call in April but this was rescheduled to July.  -she finds that only the gabapentin  helps -she is not sure if the fentanyl  helps When she is feeling really bad she tries to take another one but she gets very sleepy and dizzy.  -she is taking 8 a day of 100mg  per day- she takes two gabapentin  and tylenol  and hydrocodone  in the morning. She takes another two at breakfast around 11, another 2 at lunch around 1:30pm.  -she had a recent root canal and pain has become much more severe after that -her pain is in the two teeth next to where she had the root canal -Oragel heps for a bout an hour.  -she tried increasing her Gabapentin  to 300mg  TID but she could not tolerate this. It made her too sleepy.  -she contacted Dr. Darlis and he recommended she pause wean of Fentanyl  patch and I agree with this -she took last patch last night. She still has 12.5mcg patch available -her husband asks whether  she should take a higher dose of gabapentin  -the fentanyl  patch makes her very sleepy and so allows her to sleep. It has also made her dizzier.  -the 50mcg fentanyl  patch did not provide more relief- she would like to wean off. Weaned to 25, then 12.30mcg, then off this week -referral was to [facility] and not to Dr. Kirke Solders she states and they need a new referral send to Dr. Solders -she is using the compound cream and this helps- she uses this twice per day -capsaicin  made it burn  -lamotrigine  makes her too sleepy and dizzy to come down the stairs by herself and she does not feel comfortable going to the shower -left eye more open than usual. -she does not feel she is getting relief from the fentanyl  patch -she feels that the procedure at [facility] is a last resort.  -Given darkening of skin and worsening eye lid closure, MRI brain stat obtained and shows new cyst that could be post-surgical. Recommended she go to ED given worsening dizziness and facial swelling as well. ED physician discussed case with NSGY and neurology and discharged home recommending follow-up with Dr. Dartha and Dr. Tarri. She was given IV Fentanyl  in the ED which helped and discharged on 12mcg Fentanyl  patch, which has not helped.  -discussed increasing Fentanyl  patch to 25mcg since she has not experienced any negative side effects, no respiratory depression, and her husband is agreeable. I have sent in new script to start Saturday, but insurance required prior auth, which was approved.  -tonight would be the third tay of the 25mcg Fentanyl  patch -she is still taking the Gabapentin  -she feels that when she lays down she is spinning.  -eye continues to appear more closed.  -Husband has called Dr. Sunnie and Dr. Teddi office to scheduled follow-up appointments and has appointments with both tomorrow.  -Will run out of Fentanyl  patch by Sunday -pain is worsening and nothing  is providing  relief -she asks whether she should go to ED -she has also noted dizziness and is not sure if this is from being in the MRI or from the Savella .  -morphine  was not effective and caused itching, sarna lotion did help wit this.  -hydrocodone  has been most effective thus far -nucynta  was not effective but she is not sure whether she gave it a fair trial -she has month appointments scheduled with Fidela and f/u with me in Feb -she used the Norco last night and this morning with good benefit and has started to feel the pain return this afternoon -she did find benefit from the compound cream- she continues to use this.  -She continues to have a sharp shooting pain doing around her eyelid and the tip teeth and in the top of her head.  -has a radiofrequency ablation treatment that helped but not completely -she is not having as much sharp knife-like pin like in her eye Her top teeth in her left law have been hurting really badly.  -she now takes the baclofen  with the gabapentin  and they seem to work well together.  -she had surgical procedure and feels worse -yesterday she took a half a tablet but this morning she took a whole one. She does fine with the hydrocodone  but a whole one makes her too sleepy.  -she is taking 2 100mg  Gabapentin  three times per day but it makes her dizzy and sleepy so she cannot funciton -the cold worsens her pain.  -The IV fentanyl  did help with the pain.  -her husband feels she should get one more dose of the 25mg  fentanyl . -she did get one week benefit from the steroid injection.  -she is doing terribly -she is miserable -she feels depressed due to the pain -she has considered taking the whole bottle of hydrocodone  because because she can't take this pain anymore.  -she started taking the Gabapentin  300mg  TID.  -she does find relief from the hydrocodone  -the pain is constant -she wonders if it is inflammation.  -she has not tried turmeric. Her son-in-law gave her a  bottle and she couldn't stomach it.   2) Fibromyalgia: -She has been almost nonfunctional due to the severity of her pain. It continues to be very severe.  -She would like to wean off the Gabapentin  as she feels at risk of falls -she is taking Topamax  BID. She felt this was helpful but made her unsteady on her feet.  -still using gabapentin  and this is helping.  -she feels so fatigued that she can only have a shower every other day.   3) Osteoporosis: -husband asks whether she should get a Prolia  shot.   4) Dizziness: -head was swimming -she felt like she was going to fall.  -worsening -feels like she is spinning, not the room  -She tried the Phenytoin  for 2 days and did not feel benefit so stopped it.   -she feels the gabapentin  aggravates this and she would love to wean off but knows it helps her.   -She feels very sleepy during the day.   -She has never tried Topirmate before.   5) Sore throat -developed stopping lamotrigine  since not helping.  6) Rheumatoid arthritis: -she was diagnosed by Dr. Derrek -she has pain in hips, ankles, and knees   Prior history:  Her pain continues to be in her left eye, forehead. She has not tried any topicals for pain. She uses capsaicin  for her back and knees.   Since  last visit nothing we have tried has helped with her pain. She has tried to decrease her Gabapentin  dose but then her pain increased so she has resumed taking 4 Gabapentin  per day.   Her husband asks whether he should contact her surgeon regarding whether nerve may have become unclamped.   Discussed prolotherapy, trigger point injections, Baclofen , Clonazepam, Phenytoin  as other options we could try,=.  Pain continues to be excruciating and she states she is not sure how much longer she can live like this.   Prior history: She has having some nausea, lightheadedness and felt this may be related to her Gabapentin . She did not take any yesterday and then felt more sever pain  in the evening. She also gets electrical shooting across her jaw.   She does not feel that she has cluster headaches as the pain has been so constant. She feels that she is getting worse. The oxygen made her feel relaxed but it is not helping.   The Sarna lotion does help with the itching in her head.   She was willing to try the Amitriptyline  but it makes her too sleepy in the morning.   Her teeth and left eye incredibly hurt.   She feels that the Gabapentin  has been making her dizziness.   She asks about biofeedback.   The pain is 7/10 on average, similar to last time.   When the pain is severe it can make her depressed.    6) Macular degeneration:  Has been following with ophthalmology  7) Family illness: -she lost two nephews recently  8) Knee osteoarthritis: -had a cortisone shot and this really helped  9) Constipation: -she gets this from the hydrocdone -she has a BM every day and half- there is straining -[name] says diet is poor -she tries to eat a lot of fruit for breakfast  Pain Inventory Average pain 10 Pain Right Now 10 My pain is constant, tingling, sharp, dull, stabbing, aching, numbness, burning  In the last 24 hours, has pain interfered with the following? General activity 10 Relation with others 10 Enjoyment of life 10  What TIME of day is your pain at its worst? Morning, daytime, evening, night Sleep (in general) Good  Pain is worst with walking, bending, sitting, standing, some activities  Pain improves with rest, heat, exercise, medication  Relief with medication is not able to answer.   Family History  Problem Relation Age of Onset   Heart disease Mother    Heart disease Father    Stroke Father    Stroke Sister    Asthma Daughter    Colon cancer Neg Hx    Social History   Socioeconomic History   Marital status: Married    Spouse name: Not on file   Number of children: 2   Years of education: college   Highest education level:  Not on file  Occupational History   Occupation: housewife    Employer: RETIRED  Tobacco Use   Smoking status: Never   Smokeless tobacco: Never  Vaping Use   Vaping status: Never Used  Substance and Sexual Activity   Alcohol use: No    Alcohol/week: 0.0 standard drinks of alcohol   Drug use: No   Sexual activity: Not on file  Other Topics Concern   Not on file  Social History Narrative   1 cup of Jasmine tea daily    Social Drivers of Health   Financial Resource Strain: Low Risk  (04/21/2024)   Received from  [facility]   Overall Financial Resource Strain (CARDIA)    Difficulty of Paying Living Expenses: Not hard at all  Food Insecurity: No Food Insecurity (04/21/2024)   Received from [facility]   Hunger Vital Sign    Within the past 12 months, you worried that your food would run out before you got the money to buy more.: Never true    Within the past 12 months, the food you bought just didn't last and you didn't have money to get more.: Never true  Transportation Needs: No Transportation Needs (04/21/2024)   Received from [facility] - Transportation    In the past 12 months, has lack of transportation kept you from medical appointments or from getting medications?: No    Lack of Transportation (Non-Medical): No  Physical Activity: Inactive (11/13/2019)   Received from [facility] visits prior to 08/22/2022.   Exercise Vital Sign    On average, how many days per week do you engage in moderate to strenuous exercise (like a brisk walk)?: 0 days    On average, how many minutes do you engage in exercise at this level?: 0 min  Stress: No Stress Concern Present (11/13/2019)   Received from [facility] visits prior to 08/22/2022.   [employer] of Occupational [employer] - Occupational Stress Questionnaire    Feeling of Stress : Not at all  Social Connections: Socially  Integrated (11/13/2019)   Received from [facility] visits prior to 08/22/2022.   Social Connection and Isolation Panel    In a typical week, how many times do you talk on the phone with family, friends, or neighbors?: More than three times a week    How often do you get together with friends or relatives?: More than three times a week    How often do you attend church or religious services?: 1 to 4 times per year    Do you belong to any clubs or organizations such as church groups, unions, fraternal or athletic groups, or school groups?: Yes    How often do you attend meetings of the clubs or organizations you belong to?: 1 to 4 times per year    Are you married, widowed, divorced, separated, never married, or living with a partner?: Married   Past Surgical History:  Procedure Laterality Date   CHOLECYSTECTOMY  03/2010   COLONOSCOPY  2009   Dr. Author    CYSTOSCOPY     Multiple Cystoscopies with stents or biopsy    ESOPHAGOGASTRODUODENOSCOPY  10/2013   EYE SURGERY     bilateral cataract removal   Gamma knife radiation  01/2018   for trigeminal neuralgia   REFRACTIVE SURGERY Bilateral 2022   RHIZOTOMY Left 04/28/2021   Procedure: Left Percutaneous trigeminal balloon rhizotomy;  Surgeon: Cheryle Debby LABOR, MD;  Location: [facility] OR;  Service: Neurosurgery;  Laterality: Left;   TONSILLECTOMY     Past Surgical History:  Procedure Laterality Date   CHOLECYSTECTOMY  03/2010   COLONOSCOPY  2009   Dr. Author    CYSTOSCOPY     Multiple Cystoscopies with stents or biopsy    ESOPHAGOGASTRODUODENOSCOPY  10/2013   EYE SURGERY     bilateral cataract removal   Gamma knife radiation  01/2018   for trigeminal neuralgia   REFRACTIVE SURGERY Bilateral 2022   RHIZOTOMY Left 04/28/2021   Procedure: Left Percutaneous trigeminal balloon rhizotomy;  Surgeon: Cheryle Debby  A, MD;  Location: [facility] OR;  Service: Neurosurgery;  Laterality: Left;   TONSILLECTOMY      Past Medical History:  Diagnosis Date   Adenomatous colon polyp 03/24/2018   Allergy    Arthritis    Cataract    Chronic headaches    Depression    Fibromyalgia    Gallstones    GERD (gastroesophageal reflux disease)    Hyperlipemia    Hypertension    Interstitial cystitis    Lactose intolerance    LBBB (left bundle branch block)    Osteoarthritis    Osteopenia    Pneumonia    Thyroid  disease    Tinnitus    Trigeminal neuralgia    BP 119/72   Pulse 77   Ht 5' 3 (1.6 m)   Wt 150 lb (68 kg)   SpO2 96%   BMI 26.57 kg/m   Opioid Risk Score:   Fall Risk Score:  `1  Depression screen [facility] 2/9     05/08/2024    9:22 AM 04/03/2024   10:20 AM 02/08/2024   11:35 AM 11/19/2023   10:10 AM 10/05/2023    9:18 AM 05/25/2023   11:27 AM 02/23/2023   11:21 AM  Depression screen PHQ 2/9  Decreased Interest 0 0 0 0 0 0 0  Down, Depressed, Hopeless 0 1 0 0 0 0 0  PHQ - 2 Score 0 1 0 0 0 0 0  Altered sleeping  0  0     Tired, decreased energy  0  0     Change in appetite  0       Feeling bad or failure about yourself   0  0     Trouble concentrating  0  0     Moving slowly or fidgety/restless  0  0     Suicidal thoughts  0  0     PHQ-9 Score  1   0      Difficult doing work/chores  Somewhat difficult          Data saved with a previous flowsheet row definition    Review of Systems  Constitutional: Negative.   HENT: Negative.    Eyes:  Positive for visual disturbance.  Respiratory: Negative.    Cardiovascular: Negative.   Gastrointestinal: Negative.   Endocrine: Negative.   Genitourinary: Negative.   Musculoskeletal: Negative.        Pain in left side of face  Skin: Negative.   Allergic/Immunologic: Negative.   Neurological:  Positive for dizziness, tremors, light-headedness and numbness.       Tingling   Hematological: Negative.   Psychiatric/Behavioral: Negative.    All other systems reviewed and are negative.      Objective:   Physical Exam  Gen: no  distress, normal appearing HEENT: oral mucosa pink and moist, NCAT Cardio: Reg rate Chest: normal effort, normal rate of breathing Abd: soft, non-distended Ext: no edema Psych: pleasant, normal affect Skin: intact Neuro: Alert and oriented x3, left eye drop and facial droop- stable, right hip TTP . Assessment & Plan:  [name] is an 83 year old woman who presents for f/u of left sided trigeminal neuralgia and dizziness.    1) Sensory deafferation pain, left sided trigeminal neuralgia vs cluster headaches (she does have lacrimation and runny nose on that side). -discussed that Nucynta  has worsened her pain -discussed that hydrocodone  5mg  has been helpful when the pain is severe, discussed increasing so she has the option to  take 1-2 tabs per day -discussed that her stimulator is currently off -discussed that Dr. Buelah PA  -discussed avoiding gluten -discussed that pain was so severe at one point that she did not want to live -discussed that her pain continues to be severe, advised continuing gabapentin  to 200mg  TD, discussed that she needs refill of Fentanyl  patch 25mcg- sent 10 patches, discussed that higher doses of gabapentin  cause dizziness and higher doses of fentanyl  patch make her sleepy -discussed that Journavx could help -discussed that seeing the water on the beach caused dizziness -decrease fentanyl  patch to 25mcg -discussed that insurance company rejected Nucynta , discussed potential costs of these -discussed that she has been happy with her new provider at [facility] -discussed that she is following up with Dr. Colon at [facility] -refilled fentanyl , discussed that she tried stopping this and pain was very severe -avoid proceed foods, eggs, gluten, dairy -recommended eating furits and vegetables -continue hydrocodone , discussed that she feel she needs either a higher dose or frequency, discussed that she has become somewhat more tolerant to the medication than she was 15  years ago -discussed that gabapentin  used to make her to lean to the left but now it makes her lean to the right -discussed that the gabapentin  makes her sleepy -discussed that she feels that the hydrocodone  is more effective for her than the gabapentin  -discussed [name] recommendation for an intrathecal pain pump, discussed that she has a trial scheduled for the pump, discussed that she is considering this at [facility] -encouraged discussing with with the [facility] team risks and benefits of this procedure -discussed that she is doing Zoom calls with a psychiatrist -d/c narcan  and naltrexone, discussed that she uses the hydrocodone  sparingly.  -discussed that she had new programs installed on Monday -discussed that she is doing well with the hydrocodone  -discussed pruritus with the fentanyl  patch -continue following with pain psychologist, discussed that I think it is phenomenal that she is doing this -refilled hydrocodone  and fentanyl  -discussed rating her pain for the programming of electrical stimulation   -discussed mechanism of action of low dose naltrexone as an opioid receptor antagonist which stimulates your body's production of its own natural endogenous opioids, helping to decrease pain. Discussed that it can also decrease T cell response and thus be helpful in decreasing inflammation, and symptoms of brain fog, fatigue, anxiety, depression, and allergies. Discussed that this medication needs to be compounded at a compounding pharmacy and can more expensive. Discussed that I usually start at 1mg  and if this is not providing enough relief then I titrate upward on a monthly basis.    -discussed that savella  caused hallucinations -discussed that we tried lyrica  and we cannot recall but she had some side effect -discussed that topamax  did not help and made her feel worse -discussed that carbamazpeine did not help -discussed that she is happy that she does not have to return to [place] as  those trips caused her a lot of fatigue -discussed that pain was bad after she cooked for Thanksgiving -discussed her follow-up with [name] -discussed the negative experience when she last went to [place]  -discussed that next available appointment was on June 24th or 26th, but they were able to move this up to May 22nd or 24th -discussed that she keeps a journal with the setting with the level of her pain.  -continue Diclofenac 3%, Gabapentin  5%, Lidocaine  5%, Menthol 1% compounded at [facility]:  apply 1-2 pumps three to four times per day  as needed, discussed that this dose help -discussed that she has had to turn off the stimulator more since she has established care at [facility] -discussed response to her stimulator -discussed doubling the gabapentin  when pain is severe Increase low dose naltrexone to 3mg  HS -discussed her response to the stimulator She does have a history of migraines.  -Discussed that if she has to be in bed all day due to her pain it may be beneficial for her husband to give her at least one hydrocodone  per day to enable her to function -provided refill of Fentanyl  patch as she will be leaving for her surgery in [place] soon and her husband plans to stay there for some time in case she has any surgical complications.  -discussed her follow-up with pain psychology.  -recommended weaning off Fentanyl , discussed stopping fentanyl  because she is on 12.5mg  today but she had tried this a couple of weeks ago and pain was so severe that she needed patch to be replaced.  -discussed that she is taking hydrocodone  4 tablets per day, and recommend to decrease this to 3 tabs per day for three days and then to keep decreasing the tab by once per day.  -discussed that skipping the morning dose of hydrocodone  will at least allow her to sleep -recommended trying Frankincense and Myrrh essential oils mixed in coconut oil and applied to area of pain -recommended restarting  Topamax  -called [facility] to prescribe compounding gel refill -continue Gabapentin  100mg  up to 8 times a day, discussed her current schedule of takin -discussed using Oragel multiple times per ay since this helps for an hour, use the topical cream intermittently in between -continue follow-up with [facility] to set up an appointment for her with them. Unfortunately Dr. Zulema has retired but was able to set her up with Dr. Binnie who specializes in migraines and trigeminal neuralgia on July 25th. Let patient and husband know. They will mail her their address.   -commended her for calling Dr. Darlis as well to let him know how she is feeling -recommended taking 1 hydrocodone  now for the severe pain, and discussed that the Fentanyl  patch with take 12-24 hours to kick in -discussed with April the neurology referral and she did specify Dr. Kirke Zulema and spoke with the [facility] department -discussed CBD oil, her son bought her some of this and she felt that it greatly helped  Recommend topical CBD oil- discussed its benefits in reducing inflammation, pain, insomnia, and anxiety, but she stopped because effect wore of.  -Discussed that CBD oil differs from marijuana in that it does not contain THC- the substance that causes euphoria.  -Discussed that it is made from the hemp plant.  -It has been used for thousands of years -preliminary research suggests that if may be able to shrink cancerous tumors, stop plaque formation in Alzheimer's Disease, and slow the progress of brain disease from concussions.  -Additional benefits that have been demonstrated in studies include improved nausea, indigestion, and brain health, and reduced seizures.  -In a survey 92% of patients who tried medical cannabis felt it improved symptoms such as chronic pain, arthritis, migraines, and cancer.   -Provided with a pain relief journal and discussed that it contains foods and lifestyle tips to  naturally help to improve pain. Discussed that these lifestyle strategies are also very good for health unlike some medications which can have negative side effects. Discussed that the act of keeping a journal can be therapeutic and  helpful to realize patterns what helps to trigger and alleviate pain.   -discussed with Dr. Saul trigeminal deafferation pain, discussed the difference between this and trigeminal neuralgia with patient and husband, discussed motor cortex stimulation. -discussed whether or not to continue fentanyl  patch, husband does feel that the IV fentanyl   -encouraged following with Dr. Sweet/Dr. Cleotilde at [facility] to try procedure recommended by D. Ostegard.  -discussed mechanism of action of low dose naltrexone as an opioid receptor antagonist which stimulates your body's production of its own natural endogenous opioids, helping to decrease pain. Discussed that it can also decrease T cell response and thus be helpful in decreasing inflammation, and symptoms of brain fog, fatigue, anxiety, depression, and allergies. Discussed that this medication needs to be compounded at a compounding pharmacy and can more expensive. Discussed that I usually start at 1mg  and if this is not providing enough relief then I titrate upward on a monthly basis.   -stop lamotrigine  as it is making her dizzy without benefit -reviewed patient's medical notes -discussed her progress with getting a referral to [facility] -recommended going to ED given darkening of skin, worsening eye closure, finding of cyst, dizziness to receive prompt neurosurgical eval regarding if surgery is necessary, and also for pain management given worsening pain refractory to all treatments and resulting suicidality. Reviewed ED doc notes- he discussed with on-call neurology and NSGY and determined that patient could be discharged with outpatient follow-up. IV fentanyl  was administered with relief to patient. She was discharged on  fentanyl  patch which has not provided relief. She is getting 12mcg- no side effects- discussed increasing to 25mcg and her husband is in agreement. Prior auth completed and approved. Discussed with husband starting patch on Satruday after she has worn 12mcg patch for 72 hours -encouraged follow-up with Dr. Saul, husband let me know appointment has been set up 1/18.  -recommended follow-up with Dr. 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  -Discussed that turmeric  -discussed steroid taper. -continue savella , husband feels like it is helping with her suicidal thoughts. Increase to 25mg .  Discontinued hydrocodone   -discuss mouth guard with dentist.  -d/c topamax  -she does have another procedure scheduled. -refilled Norco -follow-up monthly with Fidela and with me in February, placed on waitlist for earlier appointment with me, advised phone visits/MyChart are a great way to communicate in the interim -discussed that she can take an additional one or two tylenol  for breakthrough pain -recommended checking CMP for liver enzymes with next labs -prescribed NAC 600mg  BID to protect the liver while taking tylenol , discussed its health benefits for mitochondrial function -d/c Baclofen   -f/u with Dr. Saul and Dr. Darlis 1/18 -discussed retrying carbamazepine  to see if it is effective for her. It is $89 for her, so we discussed trying Lamictal  instead.  -encouraged anti-inflammatory diet.  -she had sinusitis that was viral and was treated with antibiotics. This was in the early 90s. The symptoms resolved after 2 months. She uses to get sinus infections and saline rinse.  -She has had  multiple root canals.  -Discontinue 100% oxygen via facemask as this was not helpful.  -Discussed that unfortunately Sprint PNS is not indicated for craniofacial pain. -Amitriptyline  and Lyrica  did not help.  -  Continue Cymbalta  60mg .  -Topiramate  helped but caused dizziness.  -Provided with hand out with information regarding trigeminal neuralgia -Failed Penytoin.  -trial lidocaine  patch -She would like to try biofeedback. -reviewed neurology note.  -referred to Dr. Kirke Solders.  -will try trigger point injections next visit.  -Continue Ketamine 10%, Baclofen  2%, Cyclobenzaprine 2%, Ketoprofen 10%, Gabapentin  6%, Bupivacaine 1%, Amitryptiline 5% to apply to painful areas- using 3-4 times per day.  -Can consider Meloxicam if Phenytoin  does not help.  -Discussed Qutenza  as an option for neuropathic pain control. Discussed that this is a capsaicin  patch, stronger than capsaicin  cream. Discussed that it is currently approved for diabetic peripheral neuropathy and post-herpetic neuralgia, but that it has also shown benefit in treating other forms of neuropathy. Provided patient with link to site to learn more about the patch: https://www.qutenza .com/. Discussed that the patch would be placed in office and benefits usually last 3 months. Discussed that unintended exposure to capsaicin  can cause severe irritation of eyes, mucous membranes, respiratory tract, and skin, but that Qutenza  is a local treatment and does not have the systemic side effects of other nerve medications. Discussed that there may be pain, itching, erythema, and decreased sensory function associated with the application of Qutenza . Side effects usually subside within 1 week. A cold pack of analgesic medications can help with these side effects. Blood pressure can also be increased due to pain associated with administration of the patch.  -she cut down on her gabapentin  due to her dizziness    Turmeric to reduce inflammation--can  be used in cooking or taken as a supplement.  Benefits of turmeric:  -Highly anti-inflammatory  -Increases antioxidants  -Improves memory, attention, brain disease  -Lowers risk of heart disease  -May help prevent cancer  -Decreases pain  -Alleviates depression  -Delays aging and decreases risk of chronic disease  -Consume with black pepper to increase absorption    Turmeric Milk Recipe:  1 cup milk  1 tsp turmeric  1 tsp cinnamon  1 tsp grated ginger (optional)  Black pepper (boosts the anti-inflammatory properties of turmeric).  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  -she is a good metabolizer of amitriptyline  and has tolerated nortrypilline in the past, but without much benefit.    3) CKD: encouraged 6-8 glasses of water per day. She states that she had AKI with Ibuprofen in the past. Discussed checking Cr level next visit if we are considering Meloxicam -Discussed that Nucynta , and other pain medications, may stay in her bloodstream longer given her CKD   4) Hypernatremia: encouraged hydration   5) General health and pain: provided with list of healthy foods that are both nutritious and fight pain.   6) Depressed: Amitriptyline  did help with this but she could not tolerate the drowsiness. Encouraged focusing on the factors that she can control, such as anti-inflammatory diet.    7) Fibromyalgia: Discussed goal to wean steroids prior to surgery. Continue 1mg  daily. She  is on that intermittently. She started this again about a month ago. She has been on this about a year. -avoid gluten/dairy -continue gabapentin  -recommended eating a lot of fruits and vegetables -recommended avoiding eggs, gluten, dairy, processed foods Continue Savella  -discussed benefits of cold water showers.  -continue gabapentin .  Continue fentanyl  and hydrocodone  -avoid processed foods -avoid gluten, dairy, and eggs -eat fruits, vegetables, spices, and herbs -Discussed current symptoms of pain and history of pain.  -Discussed benefits of exercise in reducing pain. -Discussed following foods that may reduce pain: 1) Ginger (especially studied for arthritis)- reduce leukotriene production to decrease inflammation 2) Blueberries- high in phytonutrients that decrease inflammation 3) Salmon- marine omega-3s reduce joint swelling and pain 4) Pumpkin seeds- reduce inflammation 5) dark chocolate- reduces inflammation 6) turmeric- reduces inflammation 7) tart cherries - reduce pain and stiffness 8) extra virgin olive oil - its compound olecanthal helps to block prostaglandins  9) chili peppers- can be eaten or applied topically via capsaicin  10) mint- helpful for headache, muscle aches, joint pain, and itching 11) garlic- reduces inflammation 12) Green tea- reduces inflammation and oxidative stress, helps with weight loss, may reduce the risk of cancer, recommend Double [employer] of Tea daily  Link to further information on diet for chronic pain: [email or web address]/   8) Dizziness: -discussed that this could be from the combination of gabapentin  and baclofen .  -discussed that all the medications we are trying for pain may worsen the dizziness  9) Constipation:  -Provided list of following foods that help  with constipation and highlighted a few: 1) prunes- contain high amounts of fiber.  2) apples- has a form of dietary fiber called pectin that accelerates stool movement and increases beneficial gut bacteria 3) pears- in addition to fiber, also high in fructose and sorbitol which have laxative effect 4) figs- contain an enzyme ficin which helps to speed colonic transit 5) kiwis- contain an enzyme actinidin that improves gut motility and reduces constipation 6) oranges- rich in pectin (like apples) 7) grapefruits- contain a flavanol naringenin which has a laxative effect 8) vegetables- rich in fiber and also great sources of folate, vitamin C, and K 9) artichoke- high in inulin, prebiotic great for the microbiome 10) chicory- increases stool frequency and softness (can be added to coffee) 11) rhubarb- laxative effect 12) sweet potato- high fiber 13) beans, peas, and lentils- contain both soluble and insoluble fiber 14) chia seeds- improves intestinal health and gut flora 15) flaxseeds- laxative effect 16) whole grain rye bread- high in fiber 17) oat bran- high in soluble and insoluble fiber 18) kefir- softens stools -recommended to try at least one of these foods every day.  -drink 6-8 glasses of water per day -walk regularly, especially after meals.   10) Osteopenia - was tapered off the prednisone  -discussed that currently her pain may be more of a risk to her than osteopenia given her suicidal ideation from her pain -discussed risk of fall with the medications that make her dizziness.   11) Daytime sleepiness -discussed that this is secondary to her medications.  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  -discussed red light therapy  15) HTN: -discussed clonidine  patch  could potentially help with HTN and pain.  -BP is 170/90 today.  -Advised checking BP daily at home and logging results to  bring into follow-up appointment with PCP and myself. -Reviewed BP meds today.  -Advised regarding healthy foods that can help lower blood pressure and provided with a list: 1) citrus foods- high in vitamins and minerals 2) salmon and other fatty fish - reduces inflammation and oxylipins 3) swiss chard (leafy green)- high level of nitrates 4) pumpkin seeds- one of the best natural sources of magnesium 5) Beans and lentils- high in fiber, magnesium, and potassium 6) Berries- high in flavonoids 7) Amaranth (whole grain, can be cooked similarly to rice and oats)- high in magnesium and fiber 8) Pistachios- even more effective at reducing BP than other nuts 9) Carrots- high in phenolic compounds that relax blood vessels and reduce inflammation 10) Celery- contain phthalides that relax tissues of arterial walls 11) Tomatoes- can also improve cholesterol and reduce risk of heart disease 12) Broccoli- good source of magnesium, calcium , and potassium 13) Greek yogurt: high in potassium and calcium  14) Herbs and spices: Celery seed, cilantro, saffron, lemongrass, black cumin, ginseng, cinnamon, cardamom, sweet basil, and ginger 15) Chia and flax seeds- also help to lower cholesterol and blood sugar 16) Beets- high levels of nitrates that relax blood vessels  17) spinach and bananas- high in potassium  -Provided lise of supplements that can help with hypertension:  1) magnesium: one high quality brand is Bi[employer]emizers since it contains all 7 types of magnesium, otherwise over the counter magnesium gluconate 400mg  is a good option 2) B vitamins 3) vitamin D  4) potassium 5) CoQ10 6) L-arginine 7) Vitamin C 8) Beetroot -Educated that goal BP is 120/80. -Made goal to incorporate some of the above foods into diet.    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  -discussed that she had weaned off the steroids and had been doing fine off the steroids -discussed that aquatic therapy can help -discussed that she should encourage follow-up with rheumatology to assess for giant cell arteritis -[facility] -continue 2mg  steroids  16. Sciatica: -consider red light therapy -discussed that this has improved -discussed aquatherapy -discussed that the spinal injection helped temporarily -discussed that she has been taking 4mg  of low dose naltrexone -discussed that she used some hydrocodone  she had left over from the past.  -discussed lumbosacral orthosis, advised using for activities that involve bending/twisting/walking on uneven terrain -discussed that it would probably be better to repeat steroid injection than to continue hydrocodone  -advised to use lidocaine  patch -continue capsaicin  cream  17. Family illness: -discussed that she lost 2 nephews recently  69. Depression:  -continue cymbalta  -encouraged follow-up with psychiatry   19. Dizziness: -discussed that this has improved  20. 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   22. Constipation: -discussed that fentanyl  and hydrocodone  can cause this Recommended eating an apple every day Recommended eating prunes every day -discussed that eating an apple per day and prune juice at night help,  discussed that  prune juice helps more than miralax   23. Rheumatoid arthritis: -discussed that she was recently diagnosed with Dr. Derrek -discussed that she was started on a medication that caused shortness of breath  24) Hyperglycemia: -discussed that I do not see a HgbA1c ordered  25) Neuropathy: -discussed that she has heaviness in both feet -discussed that Nucynta  was not helpful

## 2024-05-11 DIAGNOSIS — M25569 Pain in unspecified knee: Secondary | ICD-10-CM | POA: Diagnosis not present

## 2024-05-11 DIAGNOSIS — M353 Polymyalgia rheumatica: Secondary | ICD-10-CM | POA: Diagnosis not present

## 2024-05-11 DIAGNOSIS — M797 Fibromyalgia: Secondary | ICD-10-CM | POA: Diagnosis not present

## 2024-05-11 DIAGNOSIS — M549 Dorsalgia, unspecified: Secondary | ICD-10-CM | POA: Diagnosis not present

## 2024-05-11 DIAGNOSIS — G529 Cranial nerve disorder, unspecified: Secondary | ICD-10-CM | POA: Diagnosis not present

## 2024-05-11 DIAGNOSIS — M2550 Pain in unspecified joint: Secondary | ICD-10-CM | POA: Diagnosis not present

## 2024-05-11 DIAGNOSIS — M25559 Pain in unspecified hip: Secondary | ICD-10-CM | POA: Diagnosis not present

## 2024-05-11 DIAGNOSIS — Z79899 Other long term (current) drug therapy: Secondary | ICD-10-CM | POA: Diagnosis not present

## 2024-05-11 DIAGNOSIS — M810 Age-related osteoporosis without current pathological fracture: Secondary | ICD-10-CM | POA: Diagnosis not present

## 2024-05-11 DIAGNOSIS — E039 Hypothyroidism, unspecified: Secondary | ICD-10-CM | POA: Diagnosis not present

## 2024-05-16 ENCOUNTER — Encounter: Admitting: Physical Medicine and Rehabilitation

## 2024-05-22 DIAGNOSIS — H35033 Hypertensive retinopathy, bilateral: Secondary | ICD-10-CM | POA: Diagnosis not present

## 2024-05-22 DIAGNOSIS — H35372 Puckering of macula, left eye: Secondary | ICD-10-CM | POA: Diagnosis not present

## 2024-05-22 DIAGNOSIS — H43813 Vitreous degeneration, bilateral: Secondary | ICD-10-CM | POA: Diagnosis not present

## 2024-05-22 DIAGNOSIS — H353133 Nonexudative age-related macular degeneration, bilateral, advanced atrophic without subfoveal involvement: Secondary | ICD-10-CM | POA: Diagnosis not present

## 2024-05-22 DIAGNOSIS — Z961 Presence of intraocular lens: Secondary | ICD-10-CM | POA: Diagnosis not present

## 2024-05-24 ENCOUNTER — Other Ambulatory Visit: Payer: Self-pay | Admitting: Physical Medicine and Rehabilitation

## 2024-05-24 ENCOUNTER — Encounter: Payer: Self-pay | Admitting: Physical Medicine and Rehabilitation

## 2024-05-29 DIAGNOSIS — G509 Disorder of trigeminal nerve, unspecified: Secondary | ICD-10-CM | POA: Diagnosis not present

## 2024-06-01 ENCOUNTER — Other Ambulatory Visit: Payer: Self-pay | Admitting: Physical Medicine and Rehabilitation

## 2024-06-01 DIAGNOSIS — I10 Essential (primary) hypertension: Secondary | ICD-10-CM | POA: Diagnosis not present

## 2024-06-01 DIAGNOSIS — H353131 Nonexudative age-related macular degeneration, bilateral, early dry stage: Secondary | ICD-10-CM | POA: Diagnosis not present

## 2024-06-01 DIAGNOSIS — R748 Abnormal levels of other serum enzymes: Secondary | ICD-10-CM | POA: Diagnosis not present

## 2024-06-01 DIAGNOSIS — N1831 Chronic kidney disease, stage 3a: Secondary | ICD-10-CM | POA: Diagnosis not present

## 2024-06-01 DIAGNOSIS — F339 Major depressive disorder, recurrent, unspecified: Secondary | ICD-10-CM | POA: Diagnosis not present

## 2024-06-05 ENCOUNTER — Ambulatory Visit: Admitting: Family Medicine

## 2024-06-05 VITALS — BP 152/63 | Ht 63.0 in | Wt 151.0 lb

## 2024-06-05 DIAGNOSIS — M5416 Radiculopathy, lumbar region: Secondary | ICD-10-CM | POA: Diagnosis not present

## 2024-06-05 DIAGNOSIS — D2261 Melanocytic nevi of right upper limb, including shoulder: Secondary | ICD-10-CM | POA: Diagnosis not present

## 2024-06-05 DIAGNOSIS — D692 Other nonthrombocytopenic purpura: Secondary | ICD-10-CM | POA: Diagnosis not present

## 2024-06-05 DIAGNOSIS — L821 Other seborrheic keratosis: Secondary | ICD-10-CM | POA: Diagnosis not present

## 2024-06-05 DIAGNOSIS — D225 Melanocytic nevi of trunk: Secondary | ICD-10-CM | POA: Diagnosis not present

## 2024-06-05 NOTE — Patient Instructions (Signed)
 We will put in a referral for you to have an ESI of your back. Call me or send a mychart message a week after this to let me know how you're doing. If not improving as expected would consider repeating your MRI.

## 2024-06-05 NOTE — Progress Notes (Signed)
 PCP: Kristina Nottingham, MD  Patient is a 83 y.o. female here for right back pain.  Right lower back pain, right and left foot numbness For the past 2-3 weeks, patient has had lateral lower back pain. She describes pain traveling down the back of her leg to her knee. Patient denies numbness of her upper leg, she notes it sometimes feels like her shoes are still on even after she takes them off. Right foot is painful and numb intermittently and so is the left foot. For the past 3-4 days, patient has had pain in her right shin pain. She also notes left leg pain intermittently as well. No recent falls or other injuries. Patient states that going up stairs hurts a lot on the right side. She has not been able to exercise for about a month. She states she has a history of sciatica, has lumbar radiculopathy noted in problem list.  Patient had right L4 nerve root epidural block on 03/10/23 with great benefit for about 1 year. Patient has had multiple (maybe 6?) epidural injections into her spine in total over the years. 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 Has been wearing a back brace intermittently and a patellar strap daily. States she has a history of right knee pain 2/2 osteoarthritis, continues wearing a patellar strap daily.  Patient is on Norco 5-325 TID PRN, fentanyl  patch, and 200 mg gabapentin  TID for trigeminal neuralgia. Patient is on an osteoporosis medication, she is not sure which.  Patient denies falls, foot drop, bowel/bladder incontinence.  Past Medical History:  Diagnosis Date   Adenomatous colon polyp 03/24/2018   Allergy    Arthritis    Cataract    Chronic headaches    Depression    Fibromyalgia    Gallstones    GERD (gastroesophageal reflux disease)    Hyperlipemia    Hypertension    Interstitial cystitis    Lactose intolerance    LBBB (left bundle branch block)    Osteoarthritis    Osteopenia    Pneumonia    Thyroid  disease     Tinnitus    Trigeminal neuralgia     Medications Ordered Prior to Encounter[1]  Past Surgical History:  Procedure Laterality Date   CHOLECYSTECTOMY  03/2010   COLONOSCOPY  2009   Dr. Author    CYSTOSCOPY     Multiple Cystoscopies with stents or biopsy    ESOPHAGOGASTRODUODENOSCOPY  10/2013   EYE SURGERY     bilateral cataract removal   Gamma knife radiation  01/2018   for trigeminal neuralgia   REFRACTIVE SURGERY Bilateral 2022   RHIZOTOMY Left 04/28/2021   Procedure: Left Percutaneous trigeminal balloon rhizotomy;  Surgeon: Cheryle Debby LABOR, MD;  Location: [facility] OR;  Service: Neurosurgery;  Laterality: Left;   TONSILLECTOMY      Allergies[2]  BP (!) 152/63   Ht 5' 3 (1.6 m)   Wt 151 lb (68.5 kg)   BMI 26.75 kg/m      06/03/2021   11:13 AM  [facility] Exercise  Frequency of aerobic exercise (# of days/week) 0  Average time in minutes 0  Frequency of strengthening activities (# of days/week) 0        No data to display              Objective:  Physical Exam:  Gen: NAD, comfortable in exam room  Location: L and R hip and lower back - Inspection: No edema, ecchymoses, or other overlying  skin changes - Palpation: No midline spinal tenderness, moderate tenderness over right SI joint, no greater trochanteric TTP - ROM: Full passive ROM - Strength: 5/5 hip flexion/extension, abduction/adduction, external/internal rotation - Special Tests: Positive right straight leg test, negative log roll, pain with FADIR - Neurovascular: Unable to elicit DTRs bilaterally, 2+ right DP and PT pulses, intact bilateral sensation  Assessment and Plan:   Lumbar radiculopathy Patient's chronic right back and leg pain appear to be exacerbated.  She has known lumbar radiculopathy on the right.  Patient does have some new left foot numbness and some left leg pain, raising some concerns for left lumbar radiculopathy or spinal stenosis.  Reassuringly, she has  no reported weakness or weakness on exam and no red flag symptoms concerning for cauda equina.  Has previously had good benefit from [facility]. - Referral for repeat right epidural spinal injection - Follow-up in 1 week via message, would repeat MRI if not improving - Patient will continue her multimodal pain regiment from PCP  Kristina Neidlinger, MD PGY-2, [facility] 06/05/2024, 11:54 AM     [1]  Current Outpatient Medications on File Prior to Visit  Medication Sig Dispense Refill   Acetylcysteine (NAC) 600 MG CAPS Take 1 capsule (600 mg total) by mouth 2 (two) times daily. 60 capsule 0   ACIDOPHILUS LACTOBACILLUS PO Take 1 tablet by mouth daily.     amLODipine (NORVASC) 2.5 MG tablet Take 2.5 mg by mouth daily.     amLODipine (NORVASC) 5 MG tablet Take 1 tablet by mouth once daily Orally Once a day for 90 days     benzocaine  (ORAJEL) 10 % mucosal gel Use as directed 1 application in the mouth or throat as needed for mouth pain. 5.3 g 0   calcium  carbonate (SUPER CALCIUM ) 1500 (600 Ca) MG TABS tablet 1 tablet with meals Orally Once a day     cetirizine (ZYRTEC) 10 MG chewable tablet Chew by mouth.     Cholecalciferol (VITAMIN D -3) 25 MCG (1000 UT) CAPS Take 2,000 Units by mouth daily.     Cholecalciferol 125 MCG (5000 UT) capsule Take by mouth.     Cholecalciferol 50 MCG (2000 UT) CAPS Take by mouth.     cyanocobalamin (VITAMIN B12) 1000 MCG tablet Take by mouth.     denosumab  (PROLIA ) 60 MG/ML SOSY injection as directed Every 6 months     DULoxetine  (CYMBALTA ) 30 MG capsule TAKE 3 CAPSULES BY MOUTH ONCE DAILY 270 capsule 0   estradiol (ESTRACE) 0.1 MG/GM vaginal cream Place vaginally.     fentaNYL  (DURAGESIC ) 25 MCG/HR Place 1 patch onto the skin every 3 (three) days. 10 patch 0   fluticasone  (FLONASE  ALLERGY RELIEF) 50 MCG/ACT nasal spray 1 spray in each nostril Nasally Once a day     gabapentin  (NEURONTIN ) 100 MG capsule Take 2 capsules (200 mg total) by mouth 4 (four) times  daily. 300 capsule 3   HYDROcodone -acetaminophen  (NORCO/VICODIN) 5-325 MG tablet Take 1-2 tablets by mouth 3 (three) times daily as needed for moderate pain (pain score 4-6). 180 tablet 0   levothyroxine  (SYNTHROID ) 75 MCG tablet Take 75 mcg by mouth daily before breakfast.     magnesium gluconate (MAGONATE) 500 MG tablet Take 500 mg by mouth 2 (two) times daily.     Methylcobalamin 1000 MCG SUBL      naloxone  (NARCAN ) nasal spray 4 mg/0.1 mL Place 1 spray into the nose once.     NONFORMULARY OR COMPOUNDED ITEM Apply 1 Pump  topically 4 (four) times daily as needed. Ketamine 10%, Baclofen  2%, Cyclobenzaprine 2%, Ketoprofen 10%, Gabapentin  6%, Bupivacaine 1%, Amitryptiline 5% Dispense 100mg  100 each 3   ondansetron  (ZOFRAN ) 4 MG tablet 1 tablet as needed Orally Twice a day for 30 days     pantoprazole  (PROTONIX ) 40 MG tablet Take 40 mg by mouth daily.     Polyethyl Glycol-Propyl Glycol (SYSTANE FREE OP) Apply 1 drop to eye as needed.     predniSONE  (DELTASONE ) 1 MG tablet Take 2 tablets (2 mg total) by mouth daily with breakfast. 60 tablet 0   Propylene Glycol (SYSTANE COMPLETE PF OP) Apply to eye.     Pyridoxine  HCl (VITAMIN B-6 PO) Vitamin B6     RA GLUCOSAMINE SULFATE 500 MG TABS  (Patient not taking: Reported on 05/08/2024)     rosuvastatin (CRESTOR) 5 MG tablet Take 5 mg by mouth daily.     Vitamin D , Ergocalciferol , (DRISDOL ) 1.25 MG (50000 UNIT) CAPS capsule Take 1 capsule (50,000 Units total) by mouth every 7 (seven) days. 20 capsule 0   No current facility-administered medications on file prior to visit.  [2]  Allergies Allergen Reactions   Dipyridamole Other (See Comments)    Migraines  Other reaction(s): migraine  Persantine  Other reaction(s): Headache  Migraines    Migraines  Other reaction(s): migraine Persantine Other reaction(s): migraine Other reaction(s): Headache Other reaction(s): migraine  Migraines  Migraines  Other reaction(s): migraine Persantine Other  reaction(s): migraine Other reaction(s): Headache Other reaction(s): migraine    Migraines    Migraines  Other reaction(s): migraine Persantine Other reaction(s): migraine Other reaction(s): Headache Other reaction(s): migraine   Ciprofloxacin Other (See Comments)    Interstitial cystitis, worsening renal function   Diclofenac Other (See Comments)    Other Reaction(s): hives   Diclofenac Sodium Other (See Comments) and Hives    Other reaction(s): hives Other reaction(s): hives Other reaction(s): hives   Hydrochlorothiazide  Nausea Only    Chills  Taking at this time lower dose  Chills Taking at this time lower dose  Chills Taking at this time lower dose    Chills Taking at this time lower dose   Other Other (See Comments)    Cat and Dog Dander  Other reaction(s): bladder pain  Other reaction(s): muscle cramps  Other reaction(s): Unknown  Cat and Dog Dander  Other reaction(s): bladder pain Other reaction(s): muscle cramps Other reaction(s): Unknown  Other Reaction(s): Other (See Comments)  Cat and dog dander/reaction is runny nose sneezing   Oxycodone Hcl Other (See Comments)    Other reaction(s): bladder pain  Other Reaction(s): other   Pantoprazole  Other (See Comments)    Other reaction(s): increased tremors Other reaction(s): increased tremors Other reaction(s): increased tremors  Other Reaction(s): Other (See Comments)  Reaction: Tremor (intolerance)   Pantoprazole  Sodium Other (See Comments)    Other reaction(s): increased tremors  Other Reaction(s): increased tremors   Triamterene Other (See Comments)    Made Tremors worse  Other reaction(s): Other (See Comments)  Made Tremors worse Other reaction(s): Other (See Comments)   Triamterene-Hctz Other (See Comments)    Other reaction(s): muscle cramps   Buprenorphine Other (See Comments) and Nausea And Vomiting    knocks me out  Other reaction(s): Unknown  Butrans  Other Reaction(s): Drowsiness, Not  available, Unknown  knocks me out    knocks me out Other reaction(s): Unknown Butrans Other reaction(s): Unknown Other reaction(s): Unknown  Other Reaction(s): Not available   Codeine Nausea Only and Other (See Comments)  Made her feel loopy  Other reaction(s): nausea  Other Reaction(s): GI Intolerance, Other (See Comments), Unknown  Made her feel loopy Other reaction(s): nausea Other reaction(s): nausea  Made her feel loopy Other reaction(s): nausea Other reaction(s): nausea    Made her feel loopy Other reaction(s): nausea Other reaction(s): nausea   Diclofenac Sodium Hives, Other (See Comments) and Rash    Other reaction(s): hives Other reaction(s): hives Other reaction(s): hives  Other reaction(s): hives Other reaction(s): hives Other reaction(s): hives    Other reaction(s): hives Other reaction(s): hives Other reaction(s): hives   Doxycycline Other (See Comments) and Rash    Other reaction(s): rash  Other reaction(s): rash Other reaction(s): rash Other reaction(s): rash  Other reaction(s): rash Other reaction(s): rash Other reaction(s): rash    Other reaction(s): rash Other reaction(s): rash Other reaction(s): rash   Esomeprazole Other (See Comments)    Jittery, sore throat  Other reaction(s): jittery, sore throat  Other reaction(s): Other  Other Reaction(s): jittery, sore throat  Jittery, sore throat Jittery, sore throat    Jittery, sore throat Other reaction(s): jittery, sore throat Other reaction(s): jittery, sore throat Other reaction(s): Other Other reaction(s): jittery, sore throat  Jittery, sore throat Jittery, sore throat  Jittery, sore throat Other reaction(s): jittery, sore throat Other reaction(s): jittery, sore throat Other reaction(s): Other Other reaction(s): jittery, sore throat    Jittery, sore throat Jittery, sore throat    Jittery, sore throat Other reaction(s): jittery, sore throat Other reaction(s): jittery, sore throat Other  reaction(s): Other Other reaction(s): jittery, sore throat  Other Reaction(s): Other (See Comments)  Jittery, sore throat, Jittery, sore throat   Hydrochlorothiazide -Triamterene Hives, Other (See Comments) and Rash    Muscle Cramps  Other reaction(s): muscle cramps  Other reaction(s): Other  Other Reaction(s): muscle cramps  Cramps Cramps    Muscle Cramps  Other reaction(s): muscle cramps Other reaction(s): Other  Other Reaction(s): Other (See Comments)  Cramps, Cramps   Irbesartan  Diarrhea, Other (See Comments) and Rash    Other reaction(s): loose stools  Other Reaction(s): loose stools  Other reaction(s): loose stools Other reaction(s): loose stools Other reaction(s): loose stools Other reaction(s): loose stools  Other reaction(s): loose stools Other reaction(s): loose stools Other reaction(s): loose stools Other reaction(s): loose stools    Other reaction(s): loose stools Other reaction(s): loose stools Other reaction(s): loose stools Other reaction(s): loose stools   Lactose Other (See Comments)    GI upset   Lactose Intolerance (Gi) Other (See Comments)    GI upset    Lactulose Other (See Comments), Hives and Rash    GI upset   Losartan Hives, Other (See Comments) and Rash    Hot and chills  Other reaction(s): hot and chills  Other reaction(s): flushing  Other Reaction(s): Fever, hot and chills  Hot and chills Hot and chills    Hot and chills Other reaction(s): hot and chills Other reaction(s): hot and chills Other reaction(s): hot and chills Other reaction(s): flushing Other reaction(s): hot and chills  Other Reaction(s): Other (See Comments)  Hot and chills, Hot and chills   Naproxen Other (See Comments), Hives and Rash    Other reaction(s): worsens interstitial cystitis  Other Reaction(s): worsens interstitial cystitis  Worsens IC symptoms    Other reaction(s): worsens interstitial cystitis Other reaction(s): worsens interstitial cystitis Other  reaction(s): worsens interstitial cystitis   Nortriptyline Hives and Other (See Comments)    My body doesn't metabolize it anymore. Other reaction(s): Unknown Other reaction(s): Unknown Other reaction(s): Unknown Other reaction(s): Unknown  Nortriptyline Hcl Other (See Comments)    My body doesn't metabolize it anymore.  Other reaction(s): Unknown   Oxycodone Other (See Comments)    Interstitial cystitis  Bladder pain  Other reaction(s): bladder pain  Other reaction(s): other  Other Reaction(s): bladder pain  Interstitial cystitis    Interstitial cystitis Bladder pain Other reaction(s): bladder pain Other reaction(s): other    Other reaction(s): bladder pain  Other Reaction(s): Other (See Comments)   Oxycontin [Oxycodone Hcl] Other (See Comments)    Interstitial cystitis    Pennsaid [Diclofenac Sodium] Hives   Telmisartan Hives, Other (See Comments) and Rash    headache  Other reaction(s): sinus pain, ha's  Other reaction(s): headache  Other Reaction(s): Headache, sinus pain, ha's  headache headache    headache Other reaction(s): sinus pain, ha's Other reaction(s): sinus pain, ha's Other reaction(s): sinus pain, ha's Other reaction(s): headache Other reaction(s): sinus pain, ha's  Other Reaction(s): Other (See Comments)  headache, headache   Tramadol Itching and Other (See Comments)    sedation  Other reaction(s): sedation/itching  Other Reaction(s): sedation/itching  sedation sedation    sedation Other reaction(s): sedation/itching Other reaction(s): sedation/itching Other reaction(s): sedation/itching Other reaction(s): sedation/itching  sedation, sedation

## 2024-06-06 ENCOUNTER — Other Ambulatory Visit: Payer: Self-pay

## 2024-06-07 ENCOUNTER — Other Ambulatory Visit: Payer: Self-pay | Admitting: Family Medicine

## 2024-06-07 DIAGNOSIS — M5416 Radiculopathy, lumbar region: Secondary | ICD-10-CM

## 2024-06-08 NOTE — Discharge Instructions (Signed)

## 2024-06-09 ENCOUNTER — Inpatient Hospital Stay: Admission: RE | Admit: 2024-06-09 | Source: Ambulatory Visit

## 2024-06-09 DIAGNOSIS — M5416 Radiculopathy, lumbar region: Secondary | ICD-10-CM

## 2024-06-09 MED ORDER — IOPAMIDOL (ISOVUE-M 200) INJECTION 41%
1.0000 mL | Freq: Once | INTRAMUSCULAR | Status: AC
  Administered 2024-06-09: 1 mL via EPIDURAL

## 2024-06-09 MED ORDER — METHYLPREDNISOLONE ACETATE 40 MG/ML INJ SUSP (RADIOLOG
80.0000 mg | Freq: Once | INTRAMUSCULAR | Status: AC
  Administered 2024-06-09: 80 mg via EPIDURAL

## 2024-06-12 NOTE — Progress Notes (Signed)
 Pt called to update us  - she forgot to mention at her last visit that she cannot get an MRI. She had a motor cortex stimulator implanted in her brain in May of 2024 and they told her she wouldn't be able to have an MRI.  Per Dr. Wenona - if this injection doesn't help then we may need to consider a CT myelogram instead of lumbar spine.   Pt will update us  one week after her injection and we will go from there.

## 2024-06-20 ENCOUNTER — Other Ambulatory Visit: Payer: Self-pay

## 2024-06-20 DIAGNOSIS — M5416 Radiculopathy, lumbar region: Secondary | ICD-10-CM

## 2024-06-20 NOTE — Progress Notes (Signed)
 Pt states she had about a days worth of relief from lumbar ESI. She would like to proceed with advanced imaging.  Per Dr. Cleatrice- since she can't have an MRI we will order CT myelogram of lumbar spine.  Pt will call [facility] to schedule this.

## 2024-06-21 ENCOUNTER — Ambulatory Visit
Admission: RE | Admit: 2024-06-21 | Discharge: 2024-06-21 | Disposition: A | Discharge status: Home / Self Care | Source: Ambulatory Visit | Attending: Family Medicine | Admitting: Family Medicine

## 2024-06-21 ENCOUNTER — Other Ambulatory Visit: Payer: Self-pay | Admitting: Family Medicine

## 2024-06-21 DIAGNOSIS — M5416 Radiculopathy, lumbar region: Secondary | ICD-10-CM

## 2024-06-21 MED ORDER — DIAZEPAM 5 MG PO TABS
5.0000 mg | ORAL_TABLET | Freq: Once | ORAL | Status: AC
  Administered 2024-06-21: 5 mg via ORAL

## 2024-06-21 MED ORDER — ONDANSETRON HCL 4 MG/2ML IJ SOLN: 4.0000 mg | Freq: Once | INTRAMUSCULAR | Status: DC | PRN

## 2024-06-21 MED ORDER — IOPAMIDOL (ISOVUE-M 200) INJECTION 41%
18.0000 mL | Freq: Once | INTRAMUSCULAR | Status: AC
  Administered 2024-06-21: 18 mL via INTRATHECAL

## 2024-06-21 MED ORDER — MEPERIDINE HCL 50 MG/ML IJ SOLN: 50.0000 mg | Freq: Once | INTRAMUSCULAR | Status: DC | PRN

## 2024-06-21 NOTE — Discharge Instructions (Signed)

## 2024-06-23 ENCOUNTER — Other Ambulatory Visit: Payer: Self-pay | Admitting: Physical Medicine and Rehabilitation

## 2024-06-23 MED ORDER — FENTANYL 25 MCG/HR TD PT72: 1.0000 | MEDICATED_PATCH | TRANSDERMAL | 0 refills | Status: DC

## 2024-06-23 NOTE — Telephone Encounter (Signed)
 Patient's husband called stating the patient called and requested a refill on 12/30 for her patches and has not heard anything back and that she needs a refill sent in today as she is completely out of her patches

## 2024-06-25 ENCOUNTER — Other Ambulatory Visit: Payer: Self-pay | Admitting: Physical Medicine and Rehabilitation

## 2024-07-03 ENCOUNTER — Ambulatory Visit: Payer: Self-pay | Admitting: Family Medicine

## 2024-07-05 ENCOUNTER — Other Ambulatory Visit: Payer: Self-pay

## 2024-07-05 ENCOUNTER — Ambulatory Visit: Admitting: Family Medicine

## 2024-07-05 VITALS — BP 138/80 | Ht 63.25 in | Wt 151.0 lb

## 2024-07-05 DIAGNOSIS — G8929 Other chronic pain: Secondary | ICD-10-CM

## 2024-07-05 DIAGNOSIS — M25562 Pain in left knee: Secondary | ICD-10-CM

## 2024-07-05 MED ORDER — METHYLPREDNISOLONE ACETATE 40 MG/ML IJ SUSP
40.0000 mg | Freq: Once | INTRAMUSCULAR | Status: AC
  Administered 2024-07-05: 40 mg via INTRA_ARTICULAR

## 2024-07-05 NOTE — Progress Notes (Signed)
 PCP: [name], MD  Discussed the use of AI scribe software for clinical note transcription with the patient, who gave verbal consent to proceed.  History of Present Illness [name] is an 84 year old female with bilateral knee osteoarthritis who presents with worsening left knee pain.  Left Knee Pain: - Acute worsening of pain, now more severe than baseline osteoarthritic pain - Pain localized primarily to the anterior aspect of the left knee - Aggravated by stair ascent - Progression of symptoms has led to need for cane-assisted ambulation - Flare perceived to be related to compensatory weight-bearing due to bilateral knee involvement  Osteoarthritis Management: - Currently undergoing a prednisone  taper for symptom control, initiated by rheumatologist: started at 10 mg daily, tapered to 7.5 mg, then to 5 mg daily - Previously self-increased prednisone  dose from 3 mg to 5 mg daily - No history of steroid or prednisone  allergy - Receives viscosupplementation injections in both knees every 6 months; next scheduled for March - Last intra-articular corticosteroid knee injection was 2-3 years ago  Radicular Leg Pain (Sciatica-Type Symptoms): - Pain radiates down both legs to the toes - Partial improvement in radicular pain with increased prednisone  dose - Uses menthol patches on legs and feet and capsaicin  cream on back for symptom relief  Past Medical History:  Diagnosis Date   Adenomatous colon polyp 03/24/2018   Allergy    Arthritis    Cataract    Chronic headaches    Depression    Fibromyalgia    Gallstones    GERD (gastroesophageal reflux disease)    Hyperlipemia    Hypertension    Interstitial cystitis    Lactose intolerance    LBBB (left bundle branch block)    Osteoarthritis    Osteopenia    Pneumonia    Thyroid  disease    Tinnitus    Trigeminal neuralgia     Medications Ordered Prior to Encounter[1]  Past Surgical History:  Procedure  Laterality Date   CHOLECYSTECTOMY  03/2010   COLONOSCOPY  2009   Dr. Author    CYSTOSCOPY     Multiple Cystoscopies with stents or biopsy    ESOPHAGOGASTRODUODENOSCOPY  10/2013   EYE SURGERY     bilateral cataract removal   Gamma knife radiation  01/2018   for trigeminal neuralgia   REFRACTIVE SURGERY Bilateral 2022   RHIZOTOMY Left 04/28/2021   Procedure: Left Percutaneous trigeminal balloon rhizotomy;  Surgeon: Cheryle Debby LABOR, MD;  Location: [facility] OR;  Service: Neurosurgery;  Laterality: Left;   TONSILLECTOMY      Allergies[2]  BP 138/80   Ht 5' 3.25 (1.607 m)   Wt 151 lb (68.5 kg)   BMI 26.54 kg/m      06/03/2021   11:13 AM  [facility] Exercise  Frequency of aerobic exercise (# of days/week) 0  Average time in minutes 0  Frequency of strengthening activities (# of days/week) 0        No data to display              Objective:  Physical Exam:  Gen: NAD, comfortable in exam room  Left knee: No gross deformity, ecchymoses.  Mild effusion. No joint line TTP. FROM with normal strength. Negative ant/post drawers. Negative valgus/varus testing. Negative lachman.  Negative mcmurrays, apleys.  NV intact distally. Assessment & Plan Osteoarthritis of the left knee Chronic osteoarthritis with recent exacerbation and small effusion. Significant pain persists despite prednisone  taper. Previous viscosupplementation and corticosteroid injections provided relief. -  Performed left knee corticosteroid injection under ultrasound guidance. - Discussed minimal bleeding risk and infection risk of 1 in 10,000. - Advised relief typically occurs within 2-3 days; transient steroid flare possible next day. - Instructed that lack of improvement after one week indicates ineffective injection. - Reviewed upcoming viscosupplementation in March and ongoing prednisone  taper. - Advised waiting 15 minutes after removing menthol or capsaicin  patches before  applying heat.  After informed written consent timeout was performed, patient was lying supine on exam table. Left knee was prepped with alcohol swabs and utilizing superolateral approach with ultrasound guidance, patient's left knee was injected intraarticularly with 3:1 lidocaine : depomedrol. Patient tolerated the procedure well without immediate complications.     [1]  Current Outpatient Medications on File Prior to Visit  Medication Sig Dispense Refill   Acetylcysteine (NAC) 600 MG CAPS Take 1 capsule (600 mg total) by mouth 2 (two) times daily. 60 capsule 0   ACIDOPHILUS LACTOBACILLUS PO Take 1 tablet by mouth daily.     amLODipine (NORVASC) 2.5 MG tablet Take 2.5 mg by mouth daily.     amLODipine (NORVASC) 5 MG tablet Take 1 tablet by mouth once daily Orally Once a day for 90 days     benzocaine  (ORAJEL) 10 % mucosal gel Use as directed 1 application in the mouth or throat as needed for mouth pain. 5.3 g 0   buPROPion (WELLBUTRIN XL) 150 MG 24 hr tablet 1 tablet in the morning Orally Once a day; Duration: 30 days     calcium  carbonate (SUPER CALCIUM ) 1500 (600 Ca) MG TABS tablet 1 tablet with meals Orally Once a day     cetirizine (ZYRTEC) 10 MG chewable tablet Chew by mouth.     Cholecalciferol (VITAMIN D -3) 25 MCG (1000 UT) CAPS Take 2,000 Units by mouth daily.     Cholecalciferol 125 MCG (5000 UT) capsule Take by mouth.     Cholecalciferol 50 MCG (2000 UT) CAPS Take by mouth.     cyanocobalamin (VITAMIN B12) 1000 MCG tablet Take by mouth.     denosumab  (PROLIA ) 60 MG/ML SOSY injection as directed Every 6 months     DULoxetine  (CYMBALTA ) 30 MG capsule TAKE 3 CAPSULES BY MOUTH ONCE DAILY 270 capsule 0   estradiol (ESTRACE) 0.1 MG/GM vaginal cream Place vaginally.     fentaNYL  (DURAGESIC ) 25 MCG/HR Place 1 patch onto the skin every 3 (three) days. 10 patch 0   fluticasone  (FLONASE  ALLERGY RELIEF) 50 MCG/ACT nasal spray 1 spray in each nostril Nasally Once a day     gabapentin   (NEURONTIN ) 100 MG capsule Take 2 capsules (200 mg total) by mouth 4 (four) times daily. 300 capsule 3   HYDROcodone -acetaminophen  (NORCO/VICODIN) 5-325 MG tablet Take 1-2 tablets by mouth 3 (three) times daily as needed for moderate pain (pain score 4-6). 180 tablet 0   leflunomide (ARAVA) 20 MG tablet Take 20 mg by mouth daily.     levothyroxine  (SYNTHROID ) 75 MCG tablet Take 75 mcg by mouth daily before breakfast.     magnesium gluconate (MAGONATE) 500 MG tablet Take 500 mg by mouth 2 (two) times daily.     Methylcobalamin 1000 MCG SUBL      naloxone  (NARCAN ) nasal spray 4 mg/0.1 mL Place 1 spray into the nose once.     NONFORMULARY OR COMPOUNDED ITEM Apply 1 Pump topically 4 (four) times daily as needed. Ketamine 10%, Baclofen  2%, Cyclobenzaprine 2%, Ketoprofen 10%, Gabapentin  6%, Bupivacaine 1%, Amitryptiline 5% Dispense 100mg  100 each 3  ondansetron  (ZOFRAN ) 4 MG tablet 1 tablet as needed Orally Twice a day for 30 days     pantoprazole  (PROTONIX ) 40 MG tablet Take 40 mg by mouth daily.     Polyethyl Glycol-Propyl Glycol (SYSTANE FREE OP) Apply 1 drop to eye as needed.     predniSONE  (DELTASONE ) 1 MG tablet Take 2 tablets (2 mg total) by mouth daily with breakfast. 60 tablet 0   Propylene Glycol (SYSTANE COMPLETE PF OP) Apply to eye.     Pyridoxine  HCl (VITAMIN B-6 PO) Vitamin B6     RA GLUCOSAMINE SULFATE 500 MG TABS  (Patient not taking: Reported on 05/08/2024)     rosuvastatin (CRESTOR) 5 MG tablet Take 5 mg by mouth daily.     Vitamin D , Ergocalciferol , (DRISDOL ) 1.25 MG (50000 UNIT) CAPS capsule Take 1 capsule (50,000 Units total) by mouth every 7 (seven) days. 20 capsule 0   No current facility-administered medications on file prior to visit.  [2]  Allergies Allergen Reactions   Dipyridamole Other (See Comments)    Migraines  Other reaction(s): migraine  Persantine  Other reaction(s): Headache  Migraines    Migraines  Other reaction(s): migraine Persantine Other  reaction(s): migraine Other reaction(s): Headache Other reaction(s): migraine  Migraines  Migraines  Other reaction(s): migraine Persantine Other reaction(s): migraine Other reaction(s): Headache Other reaction(s): migraine    Migraines    Migraines  Other reaction(s): migraine Persantine Other reaction(s): migraine Other reaction(s): Headache Other reaction(s): migraine   Ciprofloxacin Other (See Comments)    Interstitial cystitis, worsening renal function   Diclofenac Other (See Comments)    Other Reaction(s): hives   Diclofenac Sodium Other (See Comments) and Hives    Other reaction(s): hives Other reaction(s): hives Other reaction(s): hives   Hydrochlorothiazide  Nausea Only    Chills  Taking at this time lower dose  Chills Taking at this time lower dose  Chills Taking at this time lower dose    Chills Taking at this time lower dose   Other Other (See Comments)    Cat and Dog Dander  Other reaction(s): bladder pain  Other reaction(s): muscle cramps  Other reaction(s): Unknown  Cat and Dog Dander  Other reaction(s): bladder pain Other reaction(s): muscle cramps Other reaction(s): Unknown  Other Reaction(s): Other (See Comments)  Cat and dog dander/reaction is runny nose sneezing   Oxycodone Hcl Other (See Comments)    Other reaction(s): bladder pain  Other Reaction(s): other   Pantoprazole  Other (See Comments)    Other reaction(s): increased tremors Other reaction(s): increased tremors Other reaction(s): increased tremors  Other Reaction(s): Other (See Comments)  Reaction: Tremor (intolerance)   Pantoprazole  Sodium Other (See Comments)    Other reaction(s): increased tremors  Other Reaction(s): increased tremors   Triamterene Other (See Comments)    Made Tremors worse  Other reaction(s): Other (See Comments)  Made Tremors worse Other reaction(s): Other (See Comments)   Triamterene-Hctz Other (See Comments)    Other reaction(s): muscle cramps    Buprenorphine Other (See Comments) and Nausea And Vomiting    knocks me out  Other reaction(s): Unknown  Butrans  Other Reaction(s): Drowsiness, Not available, Unknown  knocks me out    knocks me out Other reaction(s): Unknown Butrans Other reaction(s): Unknown Other reaction(s): Unknown  Other Reaction(s): Not available   Codeine Nausea Only and Other (See Comments)    Made her feel loopy  Other reaction(s): nausea  Other Reaction(s): GI Intolerance, Other (See Comments), Unknown  Made her feel loopy Other reaction(s): nausea  Other reaction(s): nausea  Made her feel loopy Other reaction(s): nausea Other reaction(s): nausea    Made her feel loopy Other reaction(s): nausea Other reaction(s): nausea   Diclofenac Sodium Hives, Other (See Comments) and Rash    Other reaction(s): hives Other reaction(s): hives Other reaction(s): hives  Other reaction(s): hives Other reaction(s): hives Other reaction(s): hives    Other reaction(s): hives Other reaction(s): hives Other reaction(s): hives   Doxycycline Other (See Comments) and Rash    Other reaction(s): rash  Other reaction(s): rash Other reaction(s): rash Other reaction(s): rash  Other reaction(s): rash Other reaction(s): rash Other reaction(s): rash    Other reaction(s): rash Other reaction(s): rash Other reaction(s): rash   Esomeprazole Other (See Comments)    Jittery, sore throat  Other reaction(s): jittery, sore throat  Other reaction(s): Other  Other Reaction(s): jittery, sore throat  Jittery, sore throat Jittery, sore throat    Jittery, sore throat Other reaction(s): jittery, sore throat Other reaction(s): jittery, sore throat Other reaction(s): Other Other reaction(s): jittery, sore throat  Jittery, sore throat Jittery, sore throat  Jittery, sore throat Other reaction(s): jittery, sore throat Other reaction(s): jittery, sore throat Other reaction(s): Other Other reaction(s): jittery, sore throat     Jittery, sore throat Jittery, sore throat    Jittery, sore throat Other reaction(s): jittery, sore throat Other reaction(s): jittery, sore throat Other reaction(s): Other Other reaction(s): jittery, sore throat  Other Reaction(s): Other (See Comments)  Jittery, sore throat, Jittery, sore throat   Hydrochlorothiazide -Triamterene Hives, Other (See Comments) and Rash    Muscle Cramps  Other reaction(s): muscle cramps  Other reaction(s): Other  Other Reaction(s): muscle cramps  Cramps Cramps    Muscle Cramps  Other reaction(s): muscle cramps Other reaction(s): Other  Other Reaction(s): Other (See Comments)  Cramps, Cramps   Irbesartan  Diarrhea, Other (See Comments) and Rash    Other reaction(s): loose stools  Other Reaction(s): loose stools  Other reaction(s): loose stools Other reaction(s): loose stools Other reaction(s): loose stools Other reaction(s): loose stools  Other reaction(s): loose stools Other reaction(s): loose stools Other reaction(s): loose stools Other reaction(s): loose stools    Other reaction(s): loose stools Other reaction(s): loose stools Other reaction(s): loose stools Other reaction(s): loose stools   Lactose Other (See Comments)    GI upset   Lactose Intolerance (Gi) Other (See Comments)    GI upset    Lactulose Other (See Comments), Hives and Rash    GI upset   Losartan Hives, Other (See Comments) and Rash    Hot and chills  Other reaction(s): hot and chills  Other reaction(s): flushing  Other Reaction(s): Fever, hot and chills  Hot and chills Hot and chills    Hot and chills Other reaction(s): hot and chills Other reaction(s): hot and chills Other reaction(s): hot and chills Other reaction(s): flushing Other reaction(s): hot and chills  Other Reaction(s): Other (See Comments)  Hot and chills, Hot and chills   Naproxen Other (See Comments), Hives and Rash    Other reaction(s): worsens interstitial cystitis  Other Reaction(s): worsens  interstitial cystitis  Worsens IC symptoms    Other reaction(s): worsens interstitial cystitis Other reaction(s): worsens interstitial cystitis Other reaction(s): worsens interstitial cystitis   Nortriptyline Hives and Other (See Comments)    My body doesn't metabolize it anymore. Other reaction(s): Unknown Other reaction(s): Unknown Other reaction(s): Unknown Other reaction(s): Unknown   Nortriptyline Hcl Other (See Comments)    My body doesn't metabolize it anymore.  Other reaction(s): Unknown   Oxycodone Other (See Comments)  Interstitial cystitis  Bladder pain  Other reaction(s): bladder pain  Other reaction(s): other  Other Reaction(s): bladder pain  Interstitial cystitis    Interstitial cystitis Bladder pain Other reaction(s): bladder pain Other reaction(s): other    Other reaction(s): bladder pain  Other Reaction(s): Other (See Comments)   Oxycontin [Oxycodone Hcl] Other (See Comments)    Interstitial cystitis    Pennsaid [Diclofenac Sodium] Hives   Telmisartan Hives, Other (See Comments) and Rash    headache  Other reaction(s): sinus pain, ha's  Other reaction(s): headache  Other Reaction(s): Headache, sinus pain, ha's  headache headache    headache Other reaction(s): sinus pain, ha's Other reaction(s): sinus pain, ha's Other reaction(s): sinus pain, ha's Other reaction(s): headache Other reaction(s): sinus pain, ha's  Other Reaction(s): Other (See Comments)  headache, headache   Tramadol Itching and Other (See Comments)    sedation  Other reaction(s): sedation/itching  Other Reaction(s): sedation/itching  sedation sedation    sedation Other reaction(s): sedation/itching Other reaction(s): sedation/itching Other reaction(s): sedation/itching Other reaction(s): sedation/itching  sedation, sedation

## 2024-07-06 ENCOUNTER — Encounter: Payer: Self-pay | Admitting: Physical Medicine and Rehabilitation

## 2024-07-06 ENCOUNTER — Encounter: Attending: Physical Medicine and Rehabilitation | Admitting: Physical Medicine and Rehabilitation

## 2024-07-06 VITALS — BP 173/90 | HR 71 | Ht 63.0 in | Wt 148.6 lb

## 2024-07-06 DIAGNOSIS — M069 Rheumatoid arthritis, unspecified: Secondary | ICD-10-CM | POA: Diagnosis not present

## 2024-07-06 DIAGNOSIS — G500 Trigeminal neuralgia: Secondary | ICD-10-CM | POA: Diagnosis not present

## 2024-07-06 DIAGNOSIS — H353190 Nonexudative age-related macular degeneration, unspecified eye, stage unspecified: Secondary | ICD-10-CM | POA: Insufficient documentation

## 2024-07-06 DIAGNOSIS — R400 Somnolence: Secondary | ICD-10-CM | POA: Diagnosis not present

## 2024-07-06 MED ORDER — HYDROCODONE-ACETAMINOPHEN 5-325 MG PO TABS: 1.0000 | ORAL_TABLET | Freq: Three times a day (TID) | ORAL | 0 refills | Status: DC | PRN

## 2024-07-06 NOTE — Progress Notes (Addendum)
 "  Subjective:    Patient ID: [name], female    DOB: 13-Aug-1940, 84 y.o.   MRN: [id number]    [name] presents today for f/u of severe trigeminal neuralgia.   1) Severe trigeminal neuralgia: -continues to be severe -no changes with neruostimulator -she was on Program 1 at 12.5 when she followed with [facility]- she recommended she stay on the same program and follow up in 3 months, her husband feels there is not much more relief than she can get from the stimulator -[facility] is focusing more on changing mindset at this time  PRIOR HISTORY: -Nucynta  seemed to make her worse, especially in her teeth -some of the recent settings have been very good and cause no pain at night but then the next day the pain is worse than baseline -she has been given the choice of not having it on at night She has pain in the morning whether or not she turns the device on at night or not -pain continues to be severe -on the lower dose fentanyl  now- 25mcg- tolerating this well -Nucynta  has not been improved  -the gabapentin  continues to help -she had a program setting on her stimulator last week that was effective, she plans for follow up with [facility] about this -they have not been able to find anyone Fuller does management of the pain pump- she still wants to get it done -she feels the medication is not helping and that the stimulator is not helping -several times she has turned off the stimulator and that was not enough -she does not get negative side effects from the Fentanyl  patch -she is not doing as well as she had hoped she would after visiting [name] -they had changed some programs and these had really helped while she was in [place] -she stayed with her son in [place]  after her trip to [place] and got to see her great grandson! But after she got home her pain got so much worse. She is not sure if the distraction of family helped her -she and [name] got really tried during the trip- the floors  were slick in the shower -today pain is 8/10 -she needs refill of fentanyl  patch -mouth gets dry from gabapentin  -she is planning to see [name] at  [facility] again -she went to [facility] 2 weeks ago -Friday or Saturday night it was time for her to have another patch and her husband felt she should go off the patch -she is doing well with hydrocodone  and fentanyl  patch -had been unable to go to the gym when her pain was severe -she is not doing very well -they had to cancel one of the appointments due to snow -the next week [name] is doing her programming had the flu -she turned off the device for 2 weeks as it was making her pain worse -she had an appointment with [name] on Monday and tried new programs and they seemed to be working better -she is very happy with her new [facility] Physician but has not found any helpful settings yet -she turns off the stimulator at night as she can sleep well at night -if she turns off the stimulator during the day she has 1 hour of relief and then the old pain returns -the last two or three days she has had bad pain -she is filling out pain diaries -she was able to cook Thanksgiving dinner but felt a lot of pain the next day -she is doing ok -she  likes her new physician at [facility]. He works a lot with the [employer] rep because the rep is just for back pain -he takes pictures and asks for the location to be adjusted -at [facility] she is just given one program and then it stops working.  -Saw [name] 8 weeks ago and the new programs worked but then it stops working when she returns home -she is seeing neurosurgeon [name]  [name] researched red light therapy -she went to see the doctor in [place]  and was only been able to spend 5 minutes with her -they Fuller about 15 minutes of adjustments and the new programs seemed to hurt more than they help -has had stimulator turned off since last night -was able to enjoy her birthday -she Fuller well  for 3 weeks but recently pain has been around 8-10 -when pain is very severe it makes her eye hurt -the way her stimulator is set it makes her nose hurt She is going back to [facility] on [name] 10th -she is managing with the gabapentin  and had to take 2 extra yesterday and it Fuller not make her sleepier  -pain fluctuates  -she went to [place] last week with her children.  -she keeps a journal of when she changes the program and what her pain level is in her teeth, eyes, and cheek. She put in not to use the teeth program -she said if they could hit the nerve between the two teeth that would be helpful -they have her an 11 and 12 in that region,  -she cannot get the right level of stimulation. Before she left she decreased it by 50%. Now she is on a cheat program and the lowest amount. It is helping the teeth but hurting the eye.  -[name] had a baby on 11/14 and she is not coming back until January.  -she talked to the representative -last night she turned off the stimulator -she slept almost day today and Fuller not want to wake up for supper which was rare for her -she is having a difficult time weaning off the hydrocodone .  -she has been off of it in two days -she is expecting a return call from [name] team soon to discuss wean off fentanyl  -drove up on Tuesday and on Wednesday was supposed to have an appointment with pain psychologist. When they got there they were told that appointment had been changed to virtual. The next day they had a more sophisticated MRI. [name] moving in MRI so MRI was not as helpful. The next day they saw [name] and discussed several procedural options and then went back to the motor stimulator- that is scheduled on October 16th.  -she recently went on vacation and had to spend the majority of the vacation in bed due to the severity of her pain -she stopped using the intranasal ketamine because although it helped initially, it appeared to make her pain worse  later -she asks if she can wean off some medication as she had a recent appointment with [name] and was told she may have a lot of postoperative pain if she is taking so much pain medication. She would still like to do this and also experiences sedation from her medication. She asks how she would go about weaning her medications -she asks if she should wean off the gabapentin  since this makes her so sleepy She asks if she should restart Topamax . -she was encouraged by her phone visit with  [name] and plans to pursue surgery. She is going to [facility] next week for Brain MRI, neuropsych eval, and to discuss the risks and benefits of the procedure with [name] -pain was so severe in the past that she expressed suicidality to her husband [name].  -she feels that none of her medications are helpful but she knows that when she tries to wean them her pain worsens.  -she asks about refill of the compounding cream -her husband asks about referral to ketamine clinic for depression rather than pain -she went back to the dentist because it was urting between two teeth and he said there is nothing wrong with those teeth and it is the trigeminal nerve When she took a shower that usually seems to help with but was migrating around her face.  -appointment with neurology in July -[name] cancelled the zoom call in [name] but this was rescheduled to July.  -she finds that only the gabapentin  helps -she is not sure if the fentanyl  helps When she is feeling really bad she tries to take another one but she gets very sleepy and dizzy.  -she is taking 8 a day of 100mg  per day- she takes two gabapentin  and tylenol  and hydrocodone  in the morning. She takes another two at breakfast around 11, another 2 at lunch around 1:30pm.  -she had a recent root canal and pain has become much more severe after that -her pain is in the two teeth next to where she had the root canal -Oragel heps for a bout an hour.  -she tried  increasing her Gabapentin  to 300mg  TID but she could not tolerate this. It made her too sleepy.  -she contacted Dr. Darlis and he recommended she pause wean of Fentanyl  patch and I agree with this -she took last patch last night. She still has 12.5mcg patch available -her husband asks whether she should take a higher dose of gabapentin  -the fentanyl  patch makes her very sleepy and so allows her to sleep. It has also made her dizzier.  -the 50mcg fentanyl  patch Fuller not provide more relief- she would like to wean off. Weaned to 25, then 12.32mcg, then off this week -referral was to [facility] and not to Dr. Kirke Solders she states and they need a new referral send to Dr. Solders -she is using the compound cream and this helps- she uses this twice per day -capsaicin  made it burn  -lamotrigine  makes her too sleepy and dizzy to come down the stairs by herself and she does not feel comfortable going to the shower -left eye more open than usual. -she does not feel she is getting relief from the fentanyl  patch -she feels that the procedure at [facility] is a last resort.  -Given darkening of skin and worsening eye lid closure, MRI brain stat obtained and shows new cyst that could be post-surgical. Recommended she go to ED given worsening dizziness and facial swelling as well. ED physician discussed case with NSGY and neurology and discharged home recommending follow-up with Dr. Dartha and Dr. Tarri. She was given IV Fentanyl  in the ED which helped and discharged on 12mcg Fentanyl  patch, which has not helped.  -discussed increasing Fentanyl  patch to 25mcg since she has not experienced any negative side effects, no respiratory depression, and her husband is agreeable. I have sent in new script to start Saturday, but insurance required prior auth, which was approved.  -tonight would be the third tay of the 25mcg Fentanyl  patch -she  is still taking the Gabapentin  -she feels that when she lays  down she is spinning.  -eye continues to appear more closed.  -Husband has called Dr. Sunnie and Dr. Teddi office to scheduled follow-up appointments and has appointments with both tomorrow.  -Will run out of Fentanyl  patch by Sunday -pain is worsening and nothing is providing relief -she asks whether she should go to ED -she has also noted dizziness and is not sure if this is from being in the MRI or from the [place] .  -morphine  was not effective and caused itching, sarna lotion Fuller help wit this.  -hydrocodone  has been most effective thus far -nucynta  was not effective but she is not sure whether she gave it a fair trial -she has month appointments scheduled with [name] and f/u with me in Feb -she used the Norco last night and this morning with good benefit and has started to feel the pain return this afternoon -she Fuller find benefit from the compound cream- she continues to use this.  -She continues to have a sharp shooting pain doing around her eyelid and the tip teeth and in the top of her head.  -has a radiofrequency ablation treatment that helped but not completely -she is not having as much sharp knife-like pin like in her eye Her top teeth in her left law have been hurting really badly.  -she now takes the baclofen  with the gabapentin  and they seem to work well together.  -she had surgical procedure and feels worse -yesterday she took a half a tablet but this morning she took a whole one. She does fine with the hydrocodone  but a whole one makes her too sleepy.  -she is taking 2 100mg  Gabapentin  three times per day but it makes her dizzy and sleepy so she cannot funciton -the cold worsens her pain.  -The IV fentanyl  Fuller help with the pain.  -her husband feels she should get one more dose of the 25mg  fentanyl . -she Fuller get one week benefit from the steroid injection.  -she is doing terribly -she is miserable -she feels depressed due to the pain -she has considered taking  the whole bottle of hydrocodone  because because she can't take this pain anymore.  -she started taking the Gabapentin  300mg  TID.  -she does find relief from the hydrocodone  -the pain is constant -she wonders if it is inflammation.  -she has not tried turmeric. Her son-in-law gave her a bottle and she couldn't stomach it.   2) Fibromyalgia: -She has been almost nonfunctional due to the severity of her pain. It continues to be very severe.  -She would like to wean off the Gabapentin  as she feels at risk of falls -she is taking Topamax  BID. She felt this was helpful but made her unsteady on her feet.  -still using gabapentin  and this is helping.  -she feels so fatigued that she can only have a shower every other day.   3) Osteoporosis: -husband asks whether she should get a Prolia  shot.   4) Dizziness: -head was swimming -she felt like she was going to fall.  -worsening -feels like she is spinning, not the room  -She tried the Phenytoin  for 2 days and Fuller not feel benefit so stopped it.   -she feels the gabapentin  aggravates this and she would love to wean off but knows it helps her.   -She feels very sleepy during the day.   -She has never tried Topirmate before.   5) Sore throat -developed stopping  lamotrigine  since not helping.  6) Rheumatoid arthritis: -she was diagnosed by Dr. Derrek -she has pain in hips, ankles, and knees   Prior history:  Her pain continues to be in her left eye, forehead. She has not tried any topicals for pain. She uses capsaicin  for her back and knees.   Since last visit nothing we have tried has helped with her pain. She has tried to decrease her Gabapentin  dose but then her pain increased so she has resumed taking 4 Gabapentin  per day.   Her husband asks whether he should contact her surgeon regarding whether nerve may have become unclamped.   Discussed prolotherapy, trigger point injections, Baclofen , Clonazepam, Phenytoin  as other options we  could try,=.  Pain continues to be excruciating and she states she is not sure how much longer she can live like this.   Prior history: She has having some nausea, lightheadedness and felt this may be related to her Gabapentin . She Fuller not take any yesterday and then felt more sever pain in the evening. She also gets electrical shooting across her jaw.   She does not feel that she has cluster headaches as the pain has been so constant. She feels that she is getting worse. The oxygen made her feel relaxed but it is not helping.   The Sarna lotion does help with the itching in her head.   She was willing to try the Amitriptyline  but it makes her too sleepy in the morning.   Her teeth and left eye incredibly hurt.   She feels that the Gabapentin  has been making her dizziness.   She asks about biofeedback.   The pain is 7/10 on average, similar to last time.   When the pain is severe it can make her depressed.    6) Macular degeneration:  Has been following with ophthalmology  7) Family illness: -she lost two nephews recently  8) Knee osteoarthritis: -had a cortisone shot and this really helped  9) Constipation: -she gets this from the hydrocdone -she has a BM every day and half- there is straining -[name] says diet is poor -she tries to eat a lot of fruit for breakfast  Pain Inventory Average pain 10 Pain Right Now 10 My pain is constant, tingling, sharp, dull, stabbing, aching, numbness, burning  In the last 24 hours, has pain interfered with the following? General activity 10 Relation with others 10 Enjoyment of life 10  What TIME of day is your pain at its worst? Morning, daytime, evening, night Sleep (in general) Good  Pain is worst with walking, bending, sitting, standing, some activities  Pain improves with rest, heat, exercise, medication  Relief with medication is not able to answer.   Family History  Problem Relation Age of Onset   Heart disease  Mother    Heart disease Father    Stroke Father    Stroke Sister    Asthma Daughter    Fuller cancer Neg Hx    Social History   Socioeconomic History   Marital status: Married    Spouse name: Not on file   Number of children: 2   Years of education: college   Highest education level: Not on file  Occupational History   Occupation: housewife    Employer: RETIRED  Tobacco Use   Smoking status: Never   Smokeless tobacco: Never  Vaping Use   Vaping status: Never Used  Substance and Sexual Activity   Alcohol use: No    Alcohol/week: 0.0 standard  drinks of alcohol   Drug use: No   Sexual activity: Not on file  Other Topics Concern   Not on file  Social History Narrative   1 cup of Jasmine tea daily    Social Drivers of Health   Tobacco Use: Low Risk (05/08/2024)   Patient History    Smoking Tobacco Use: Never    Smokeless Tobacco Use: Never    Passive Exposure: Not on file  Financial Resource Strain: Low Risk  (04/21/2024)   Received from [facility]   Overall Financial Resource Strain (CARDIA)    Difficulty of Paying Living Expenses: Not hard at all  Food Insecurity: No Food Insecurity (04/21/2024)   Received from [facility]   Epic    Within the past 12 months, you worried that your food would run out before you got the money to buy more.: Never true    Within the past 12 months, the food you bought just didn't last and you didn't have money to get more.: Never true  Transportation Needs: No Transportation Needs (04/21/2024)   Received from [facility] - Transportation    In the past 12 months, has lack of transportation kept you from medical appointments or from getting medications?: No    Lack of Transportation (Non-Medical): No  Physical Activity: Not on file  Stress: Not on file  Social Connections: Not on file  Depression (PHQ2-9): Low Risk (05/08/2024)   Depression (PHQ2-9)    PHQ-2 Score: 0   Alcohol Screen: Not on file  Housing: Low Risk  (04/21/2024)   Received from [facility]   Epic    In the last 12 months, was there a time when you were not able to pay the mortgage or rent on time?: No    In the past 12 months, how many times have you moved where you were living?: 0    At any time in the past 12 months, were you homeless or living in a shelter (including now)?: No  Utilities: Not At Risk (04/21/2024)   Received from [facility]   Epic    In the past 12 months has the electric, gas, oil, or water company threatened to shut off services in your home?: No  Health Literacy: Not on file   Past Surgical History:  Procedure Laterality Date   CHOLECYSTECTOMY  03/2010   COLONOSCOPY  2009   Dr. Author    CYSTOSCOPY     Multiple Cystoscopies with stents or biopsy    ESOPHAGOGASTRODUODENOSCOPY  10/2013   EYE SURGERY     bilateral cataract removal   Gamma knife radiation  01/2018   for trigeminal neuralgia   REFRACTIVE SURGERY Bilateral 2022   RHIZOTOMY Left 04/28/2021   Procedure: Left Percutaneous trigeminal balloon rhizotomy;  Surgeon: Cheryle Debby LABOR, MD;  Location: [facility] OR;  Service: Neurosurgery;  Laterality: Left;   TONSILLECTOMY     Past Surgical History:  Procedure Laterality Date   CHOLECYSTECTOMY  03/2010   COLONOSCOPY  2009   Dr. 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  Opioid Risk Score:   Fall Risk Score:  `1  Depression screen [facility] 2/9     05/08/2024    9:22 AM 04/03/2024   10:20 AM 02/08/2024   11:35 AM 11/19/2023   10:10 AM 10/05/2023    9:18 AM 05/25/2023   11:27 AM 02/23/2023   11:21 AM  Depression screen PHQ 2/9  Decreased Interest 0 0 0 0 0 0 0  Down, Depressed, Hopeless 0 1 0 0 0 0 0  PHQ - 2 Score 0 1 0 0 0 0 0  Altered sleeping  0  0     Tired, decreased energy  0  0     Change in appetite  0       Feeling bad or failure about yourself   0  0     Trouble concentrating  0  0     Moving slowly or fidgety/restless  0  0     Suicidal thoughts  0  0     PHQ-9 Score  1   0      Difficult doing work/chores  Somewhat difficult          Data saved with a previous flowsheet row definition    Review of Systems  Constitutional: Negative.   HENT: Negative.    Eyes:  Positive for visual disturbance.  Respiratory: Negative.    Cardiovascular: Negative.   Gastrointestinal: Negative.   Endocrine: Negative.   Genitourinary: Negative.   Musculoskeletal: Negative.        Pain in left side of face  Skin: Negative.   Allergic/Immunologic: Negative.   Neurological:  Positive for dizziness, tremors, light-headedness and numbness.       Tingling   Hematological: Negative.   Psychiatric/Behavioral: Negative.    All other systems reviewed and are negative.      Objective:   Physical Exam  Gen: no distress, normal appearing HEENT: oral mucosa pink and moist, NCAT Cardio: Reg rate Chest: normal effort, normal rate of breathing Abd: soft, non-distended Ext: no edema Psych:  pleasant, normal affect Skin: intact Neuro: Alert and oriented x3, left eye drop and facial droop- stable, right hip TTP, +right knee pain with varus/valgus testing . Assessment & Plan:  Mrs. Ailey is an 84 year old woman Fuller presents for f/u of left sided trigeminal neuralgia and dizziness.    1) Sensory deafferation pain, left sided trigeminal neuralgia vs cluster headaches (she does have lacrimation and runny nose on that side). -discussed that Nucynta  has worsened her pain -discussed that she cannot find anyone here Fuller places intrathecal pumps -discussed that we have tried higher doses of medications but she is limited by side effects -discussed that at times she takes 2 hydrocodone  because she is not  getting relief from the stimulator -she is no longer getting relief from 5mg  hydrocodone   PRIOR HISTORY -discussed that hydrocodone  5mg  has been helpful when the pain is severe, discussed increasing so she has the option to take 1-2 tabs per day -discussed that her stimulator is currently off -discussed that Dr. Buelah PA  -discussed avoiding gluten -discussed that pain was so severe at one point that she Fuller not want to live -discussed that her pain continues to be severe, advised continuing gabapentin  to 200mg  TD, discussed that she needs refill of Fentanyl  patch 25mcg- sent 10 patches, discussed that higher doses of gabapentin  cause dizziness and higher doses of fentanyl  patch make her sleepy -discussed that Journavx could help -discussed that seeing the water on the beach caused dizziness -decrease fentanyl  patch to 25mcg -discussed that insurance company rejected Nucynta , discussed potential costs of these -discussed that she has been happy with her new provider at [facility] -discussed that she is following up with [name] at [facility] -refilled fentanyl , discussed that she tried stopping this and pain was very severe -avoid proceed foods, eggs, gluten, dairy -recommended  eating furits and vegetables -continue hydrocodone , discussed that she feel she needs either a higher dose or frequency, discussed that she has become somewhat more tolerant to the medication than she was 15 years ago -discussed that gabapentin  used to make her to lean to the left but now it makes her lean to the right -discussed that the gabapentin  makes her sleepy -discussed that she feels that the hydrocodone  is more effective for her than the gabapentin  -discussed [name] recommendation for an intrathecal pain pump, discussed that she has a trial scheduled for the pump, discussed that she is considering this at [facility] -encouraged discussing with with the [facility] team risks and benefits of this procedure -discussed that she is doing Zoom calls with a psychiatrist -d/c narcan  and naltrexone, discussed that she uses the hydrocodone  sparingly.  -discussed that she had new programs installed on Monday -discussed that she is doing well with the hydrocodone  -discussed pruritus with the fentanyl  patch -continue following with pain psychologist, discussed that I think it is phenomenal that she is doing this -refilled hydrocodone  and fentanyl  -discussed rating her pain for the programming of electrical stimulation   -discussed mechanism of action of low dose naltrexone as an opioid receptor antagonist which stimulates your body's production of its own natural endogenous opioids, helping to decrease pain. Discussed that it can also decrease T cell response and thus be helpful in decreasing inflammation, and symptoms of brain fog, fatigue, anxiety, depression, and allergies. Discussed that this medication needs to be compounded at a compounding pharmacy and can more expensive. Discussed that I usually start at 1mg  and if this is not providing enough relief then I titrate upward on a monthly basis.    -discussed that savella  caused hallucinations -discussed that we tried lyrica  and we cannot recall but she  had some side effect -discussed that topamax  Fuller not help and made her feel worse -discussed that carbamazpeine Fuller not help -discussed that she is happy that she does not have to return to [place] as those trips caused her a lot of fatigue -discussed that pain was bad after she cooked for Thanksgiving -discussed her follow-up with [name] -discussed the negative experience when she last went to [place]  -discussed that next available appointment was on June 24th or 26th, but they were able to move this up to May 22nd or 24th -discussed that she  keeps a journal with the setting with the level of her pain.  -continue Diclofenac 3%, Gabapentin  5%, Lidocaine  5%, Menthol 1% compounded at [facility]:  apply 1-2 pumps three to four times per day as needed, discussed that this dose help -discussed that she has had to turn off the stimulator more since she has established care at [facility] -discussed response to her stimulator -discussed doubling the gabapentin  when pain is severe Increase low dose naltrexone to 3mg  HS -discussed her response to the stimulator She does have a history of migraines.  -Discussed that if she has to be in bed all day due to her pain it may be beneficial for her husband to give her at least one hydrocodone  per day to enable her to function -provided refill of Fentanyl  patch as she will be leaving for her surgery in [place] soon and her husband plans to stay there for some time in case she has any surgical complications.  -discussed her follow-up with pain psychology.  -recommended weaning off Fentanyl , discussed stopping fentanyl  because she is on 12.5mg  today but she had tried this a couple of weeks ago and pain was so severe that she needed patch to be replaced.  -discussed that she is taking hydrocodone  4 tablets per day, and recommend to decrease this to 3 tabs per day for three days and then to keep decreasing the tab by once per day.  -discussed that skipping the  morning dose of hydrocodone  will at least allow her to sleep -recommended trying Frankincense and Myrrh essential oils mixed in coconut oil and applied to area of pain -recommended restarting Topamax  -called [facility] to prescribe compounding gel refill -continue Gabapentin  100mg  up to 8 times a day, discussed her current schedule of takin -discussed using Oragel multiple times per ay since this helps for an hour, use the topical cream intermittently in between -continue follow-up with [facility] to set up an appointment for her with them. Unfortunately Dr. Zulema has retired but was able to set her up with Dr. Binnie Fuller specializes in migraines and trigeminal neuralgia on July 25th. Let patient and husband know. They will mail her their address.   -commended her for calling Dr. Darlis as well to let him know how she is feeling -recommended taking 1 hydrocodone  now for the severe pain, and discussed that the Fentanyl  patch with take 12-24 hours to kick in -discussed with [name] the neurology referral and she Fuller specify Dr. Kirke Zulema and spoke with the [facility] department -discussed CBD oil, her son bought her some of this and she felt that it greatly helped  Recommend topical CBD oil- discussed its benefits in reducing inflammation, pain, insomnia, and anxiety, but she stopped because effect wore of.  -Discussed that CBD oil differs from marijuana in that it does not contain THC- the substance that causes euphoria.  -Discussed that it is made from the hemp plant.  -It has been used for thousands of years -preliminary research suggests that if may be able to shrink cancerous tumors, stop plaque formation in Alzheimer's Disease, and slow the progress of brain disease from concussions.  -Additional benefits that have been demonstrated in studies include improved nausea, indigestion, and brain health, and reduced seizures.  -In a survey 92% of patients Fuller tried medical  cannabis felt it improved symptoms such as chronic pain, arthritis, migraines, and cancer.   -Provided with a pain relief journal and discussed that it contains foods and lifestyle tips to  naturally help to improve pain. Discussed that these lifestyle strategies are also very good for health unlike some medications which can have negative side effects. Discussed that the act of keeping a journal can be therapeutic and helpful to realize patterns what helps to trigger and alleviate pain.   -discussed with Dr. Saul trigeminal deafferation pain, discussed the difference between this and trigeminal neuralgia with patient and husband, discussed motor cortex stimulation. -discussed whether or not to continue fentanyl  patch, husband does feel that the IV fentanyl   -encouraged following with Dr. Sweet/Dr. Cleotilde at [facility] to try procedure recommended by D. Ostegard.  -discussed mechanism of action of low dose naltrexone as an opioid receptor antagonist which stimulates your body's production of its own natural endogenous opioids, helping to decrease pain. Discussed that it can also decrease T cell response and thus be helpful in decreasing inflammation, and symptoms of brain fog, fatigue, anxiety, depression, and allergies. Discussed that this medication needs to be compounded at a compounding pharmacy and can more expensive. Discussed that I usually start at 1mg  and if this is not providing enough relief then I titrate upward on a monthly basis.   -stop lamotrigine  as it is making her dizzy without benefit -reviewed patient's medical notes -discussed her progress with getting a referral to [facility] -recommended going to ED given darkening of skin, worsening eye closure, finding of cyst, dizziness to receive prompt neurosurgical eval regarding if surgery is necessary, and also for pain management given worsening pain refractory to all treatments and resulting suicidality. Reviewed ED doc notes- he  discussed with on-call neurology and NSGY and determined that patient could be discharged with outpatient follow-up. IV fentanyl  was administered with relief to patient. She was discharged on fentanyl  patch which has not provided relief. She is getting 12mcg- no side effects- discussed increasing to 25mcg and her husband is in agreement. Prior auth completed and approved. Discussed with husband starting patch on Satruday after she has worn 12mcg patch for 72 hours -encouraged follow-up with Dr. Saul, husband let me know appointment has been set up 1/18.  -recommended follow-up with Dr. 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  -Discussed that turmeric  -discussed steroid taper. -continue savella , husband feels like it is helping with her suicidal thoughts. Increase to 25mg .  Discontinued hydrocodone   -discuss mouth guard with dentist.  -d/c topamax  -she does have another procedure scheduled. -refilled Norco -follow-up monthly with [name] and with me in February, placed on waitlist for earlier appointment with me, advised phone visits/MyChart are a great way to communicate in the interim -discussed that she can take an additional one or two tylenol  for breakthrough pain -recommended checking CMP for liver enzymes with next labs -prescribed NAC 600mg  BID to protect the liver while taking tylenol , discussed its health benefits for mitochondrial function -d/c Baclofen   -f/u with Dr. Saul and Dr. Darlis 1/18 -discussed retrying carbamazepine  to see if it is effective for her. It is $89 for her, so we discussed trying Lamictal  instead.  -encouraged anti-inflammatory diet.  -she  had sinusitis that was viral and was treated with antibiotics. This was in the early 90s. The symptoms resolved after 2 months. She uses to get sinus infections and saline  rinse.  -She has had multiple root canals.  -Discontinue 100% oxygen via facemask as this was not helpful.  -Discussed that unfortunately Sprint PNS is not indicated for craniofacial pain. -Amitriptyline  and Lyrica  Fuller not help.  -Continue Cymbalta  60mg .  -Topiramate  helped but caused dizziness.  -Provided with hand out with information regarding trigeminal neuralgia -Failed Penytoin.  -trial lidocaine  patch -She would like to try biofeedback. -reviewed neurology note.  -referred to Dr. Kirke Solders.  -will try trigger point injections next visit.  -Continue Ketamine 10%, Baclofen  2%, Cyclobenzaprine 2%, Ketoprofen 10%, Gabapentin  6%, Bupivacaine 1%, Amitryptiline 5% to apply to painful areas- using 3-4 times per day.  -Can consider Meloxicam if Phenytoin  does not help.  -Discussed Qutenza  as an option for neuropathic pain control. Discussed that this is a capsaicin  patch, stronger than capsaicin  cream. Discussed that it is currently approved for diabetic peripheral neuropathy and post-herpetic neuralgia, but that it has also shown benefit in treating other forms of neuropathy. Provided patient with link to site to learn more about the patch: [email or web address].qutenza .[email or web address]/. Discussed that the patch would be placed in office and benefits usually last 3 months. Discussed that unintended exposure to capsaicin  can cause severe irritation of eyes, mucous membranes, respiratory tract, and skin, but that Qutenza  is a local treatment and does not have the systemic side effects of other nerve medications. Discussed that there may be pain, itching, erythema, and decreased sensory function associated with the application of Qutenza . Side effects usually subside within 1 week. A cold pack of analgesic medications can help with these side  effects. Blood pressure can also be increased due to pain associated with administration of the patch.  -she cut down on her gabapentin  due to her dizziness    Turmeric to reduce inflammation--can be used in cooking or taken as a supplement.  Benefits of turmeric:  -Highly anti-inflammatory  -Increases antioxidants  -Improves memory, attention, brain disease  -Lowers risk of heart disease  -May help prevent cancer  -Decreases pain  -Alleviates depression  -Delays aging and decreases risk of chronic disease  -Consume with black pepper to increase absorption    Turmeric Milk Recipe:  1 cup milk  1 tsp turmeric  1 tsp cinnamon  1 tsp grated ginger (optional)  Black pepper (boosts the anti-inflammatory properties of turmeric).  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  -she is a good metabolizer of amitriptyline  and has tolerated nortrypilline in the past, but without much benefit.    3) CKD: encouraged 6-8 glasses of water per day. She states that she had AKI with Ibuprofen in the past. Discussed checking Cr level next visit if we are considering Meloxicam -Discussed that Nucynta , and other pain medications, may stay in her bloodstream longer given her CKD   4) Hypernatremia: encouraged hydration   5) General health and pain: provided with list of healthy foods that are both nutritious and  fight pain.   6) Depressed: Amitriptyline  Fuller help  with this but she could not tolerate the drowsiness. Encouraged focusing on the factors that she can control, such as anti-inflammatory diet.   7) Fibromyalgia: Discussed goal to wean steroids prior to surgery. Continue 1mg  daily. She is on that intermittently. She started this again about a month ago. She has been on this about a year. -avoid gluten/dairy -continue gabapentin  -recommended eating a lot of fruits and vegetables -recommended avoiding eggs, gluten, dairy, processed foods Continue Savella  -discussed benefits of cold water showers.  -continue gabapentin .  Continue fentanyl  and hydrocodone  -avoid processed foods -avoid gluten, dairy, and eggs -eat fruits, vegetables, spices, and herbs -Discussed current symptoms of pain and history of pain.  -Discussed benefits of exercise in reducing pain. -Discussed following foods that may reduce pain: 1) Ginger (especially studied for arthritis)- reduce leukotriene production to decrease inflammation 2) Blueberries- high in phytonutrients that decrease inflammation 3) Salmon- marine omega-3s reduce joint swelling and pain 4) Pumpkin seeds- reduce inflammation 5) dark chocolate- reduces inflammation 6) turmeric- reduces inflammation 7) tart cherries - reduce pain and stiffness 8) extra virgin olive oil - its compound olecanthal helps to block prostaglandins  9) chili peppers- can be eaten or applied topically via capsaicin  10) mint- helpful for headache, muscle aches, joint pain, and itching 11) garlic- reduces inflammation 12) Green tea- reduces inflammation and oxidative stress, helps with weight loss, may reduce the risk of cancer, recommend Double [employer] of Tea daily  Link to further information on diet for chronic pain: [email or web address]   8) Dizziness: -discussed that this could be from the  combination of gabapentin  and baclofen .  -discussed that all the medications we are trying for pain may worsen the dizziness  9) Constipation:  -Provided list of following foods that help with constipation and highlighted a few: 1) prunes- contain high amounts of fiber.  2) apples- has a form of dietary fiber called pectin that accelerates stool movement and increases beneficial gut bacteria 3) pears- in addition to fiber, also high in fructose and sorbitol which have laxative effect 4) figs- contain an enzyme ficin which helps to speed colonic transit 5) kiwis- contain an enzyme actinidin that improves gut motility and reduces constipation 6) oranges- rich in pectin (like apples) 7) grapefruits- contain a flavanol naringenin which has a laxative effect 8) vegetables- rich in fiber and also great sources of folate, vitamin C, and K 9) artichoke- high in inulin, prebiotic great for the microbiome 10) chicory- increases stool frequency and softness (can be added to coffee) 11) rhubarb- laxative effect 12) sweet potato- high fiber 13) beans, peas, and lentils- contain both soluble and insoluble fiber 14) chia seeds- improves intestinal health and gut flora 15) flaxseeds- laxative effect 16) whole grain rye bread- high in fiber 17) oat bran- high in soluble and insoluble fiber 18) kefir- softens stools -recommended to try at least one of these foods every day.  -drink 6-8 glasses of water per day -walk regularly, especially after meals.   10) Osteopenia - was tapered off the prednisone  -discussed that currently her pain may be more of a risk to her than osteopenia given her suicidal ideation from her pain -discussed risk of fall with the medications that make her dizziness.   11) Daytime sleepiness -discussed that this is secondary to her medications.  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  -discussed red light therapy  15) HTN: -discussed clonidine  patch could potentially help with HTN and pain.  -BP is 170/90 today.  -Advised checking BP daily at home and logging results to bring into follow-up appointment with PCP and myself. -Reviewed BP meds today.  -Advised regarding healthy foods that can help lower blood pressure and provided with a list: 1) citrus foods- high in vitamins and minerals 2) salmon and other fatty fish - reduces inflammation and oxylipins 3) swiss chard (leafy green)- high level of nitrates 4) pumpkin seeds- one of the best natural sources of magnesium 5) Beans and lentils- high in fiber, magnesium, and potassium 6) Berries- high in flavonoids 7) Amaranth (whole grain, can be cooked similarly to rice and oats)- high in magnesium and fiber 8) Pistachios- even more effective at reducing BP than other nuts 9) Carrots- high in phenolic compounds that relax blood vessels and reduce inflammation 10) Celery- contain phthalides that relax tissues of arterial walls 11) Tomatoes- can also improve cholesterol and reduce risk of heart disease 12) Broccoli- good source of magnesium, calcium , and potassium 13) Greek yogurt: high in potassium and calcium  14) Herbs and spices: Celery seed, cilantro, saffron, lemongrass, black cumin, ginseng, cinnamon, cardamom, sweet basil, and ginger 15) Chia and flax seeds- also help to lower cholesterol and blood sugar 16) Beets- high levels of nitrates that relax blood vessels  17) spinach and bananas- high in potassium  -Provided lise of supplements that can help with hypertension:  1) magnesium: one high quality brand is Bioptemizers since it contains all 7 types of magnesium, otherwise over the counter magnesium gluconate 400mg  is a good option 2) B vitamins 3) vitamin D  4) potassium 5) CoQ10 6)  L-arginine 7) Vitamin C 8) Beetroot -Educated that goal BP is 120/80. -Made goal to incorporate some of the above foods into diet.    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  -discussed that she had weaned off the steroids and had been doing fine off the steroids -discussed that aquatic therapy can help -discussed that she should encourage follow-up with rheumatology to assess for giant cell arteritis -[facility] -continue 2mg  steroids  16. Sciatica: -consider red light therapy -discussed that this has improved -discussed aquatherapy -discussed that the spinal injection helped temporarily -discussed that she has been taking 4mg  of low dose naltrexone -discussed that she used some hydrocodone  she had left over from the past.  -discussed lumbosacral orthosis, advised using for activities that involve bending/twisting/walking on uneven terrain -discussed that it would probably be better to repeat steroid injection than to continue hydrocodone  -advised to use lidocaine  patch -continue capsaicin  cream  17. Family illness: -discussed that she lost 2 nephews recently  71. Depression:  -continue cymbalta  -encouraged follow-up with psychiatry   19. Dizziness: -discussed that this has improved  20. 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  -  discussed that she has  difficult sewing due to this -discussed that she is requiring injections  22. Constipation: -discussed that fentanyl  and hydrocodone  can cause this Recommended eating an apple every day Recommended eating prunes every day -discussed that eating an apple per day and prune juice at night help, discussed that prune juice helps more than miralax   23. Rheumatoid arthritis: -discussed that she was recently diagnosed with Dr. Derrek -discussed that she was started on a medication that caused shortness of breath -continue 10mg  daily prednisone   -discussed that hydrotherapy was helpful  24) Hyperglycemia: -discussed that I do not see a HgbA1c ordered  25) Neuropathy: -discussed that she has heaviness in both feet -discussed that Nucynta  was not helpful  26) Daytime somnolence: -discussed that she has to sleep a lot during the day  27) HTN: -discussed clonidine  but she already suffers from dry mouth -discussed restarting magnesium 400mg  at night -Advised checking BP daily at home and logging results to bring into follow-up appointment with PCP and myself. -Reviewed BP meds today.  -Advised regarding healthy foods that can help lower blood pressure and provided with a list: 1) citrus foods- high in vitamins and minerals 2) salmon and other fatty fish - reduces inflammation and oxylipins 3) swiss chard (leafy green)- high level of nitrates 4) pumpkin seeds- one of the best natural sources of magnesium 5) Beans and lentils- high in fiber, magnesium, and potassium 6) Berries- high in flavonoids 7) Amaranth (whole grain, can be cooked similarly to rice and oats)- high in magnesium and fiber 8) Pistachios- even more effective at reducing BP than other nuts 9) Carrots- high in phenolic compounds that relax blood vessels and reduce inflammation 10) Celery- contain phthalides that relax tissues of arterial walls 11) Tomatoes- can also improve cholesterol and reduce risk of heart disease 12)  Broccoli- good source of magnesium, calcium , and potassium 13) Greek yogurt: high in potassium and calcium  14) Herbs and spices: Celery seed, cilantro, saffron, lemongrass, black cumin, ginseng, cinnamon, cardamom, sweet basil, and ginger 15) Chia and flax seeds- also help to lower cholesterol and blood sugar 16) Beets- high levels of nitrates that relax blood vessels  17) spinach and bananas- high in potassium  -Provided lise of supplements that can help with hypertension:  1) magnesium: one high quality brand is Bioptemizers since it contains all 7 types of magnesium, otherwise over the counter magnesium gluconate 400mg  is a good option 2) B vitamins 3) vitamin D  4) potassium 5) CoQ10 6) L-arginine 7) Vitamin C 8) Beetroot -Educated that goal BP is 120/80. -Made goal to incorporate some of the above foods into diet.    28. Right knee pain: Knee ROM orthosis ordered for pain and instability of the knee "

## 2024-07-06 NOTE — Patient Instructions (Signed)
 HTN: Advised checking BP daily at home and logging results to bring into follow-up appointment with PCP and myself. -Reviewed BP meds today.  -Advised regarding healthy foods that can help lower blood pressure and provided with a list: 1) citrus foods- high in vitamins and minerals 2) salmon and other fatty fish - reduces inflammation and oxylipins 3) swiss chard (leafy green)- high level of nitrates 4) pumpkin seeds- one of the best natural sources of magnesium 5) Beans and lentils- high in fiber, magnesium, and potassium 6) Berries- high in flavonoids 7) Amaranth (whole grain, can be cooked similarly to rice and oats)- high in magnesium and fiber 8) Pistachios- even more effective at reducing BP than other nuts 9) Carrots- high in phenolic compounds that relax blood vessels and reduce inflammation 10) Celery- contain phthalides that relax tissues of arterial walls 11) Tomatoes- can also improve cholesterol and reduce risk of heart disease 12) Broccoli- good source of magnesium, calcium, and potassium 13) Greek yogurt: high in potassium and calcium 14) Herbs and spices: Celery seed, cilantro, saffron, lemongrass, black cumin, ginseng, cinnamon, cardamom, sweet basil, and ginger 15) Chia and flax seeds- also help to lower cholesterol and blood sugar 16) Beets- high levels of nitrates that relax blood vessels  17) spinach and bananas- high in potassium  -Provided lise of supplements that can help with hypertension:  1) magnesium: one high quality brand is Bioptemizers since it contains all 7 types of magnesium, otherwise over the counter magnesium gluconate 400mg  is a good option 2) B vitamins 3) vitamin D 4) potassium 5) CoQ10 6) L-arginine 7) Vitamin C 8) Beetroot -Educated that goal BP is 120/80. -Made goal to incorporate some of the above foods into diet.

## 2024-07-06 NOTE — Addendum Note (Signed)
 Addended by: LORILEE SVEN SQUIBB on: 07/06/2024 11:01 AM   Modules accepted: Orders

## 2024-07-07 MED ORDER — HYDROCODONE-ACETAMINOPHEN 5-325 MG PO TABS: 1.0000 | ORAL_TABLET | Freq: Three times a day (TID) | ORAL | 0 refills | Status: DC | PRN

## 2024-07-07 NOTE — Addendum Note (Signed)
 Addended by: LORILEE SVEN SQUIBB on: 07/07/2024 02:13 PM   Modules accepted: Orders

## 2024-07-10 ENCOUNTER — Encounter: Payer: Self-pay | Admitting: Physical Medicine and Rehabilitation

## 2024-07-11 ENCOUNTER — Encounter: Admitting: Physical Medicine and Rehabilitation

## 2024-07-11 DIAGNOSIS — G500 Trigeminal neuralgia: Secondary | ICD-10-CM

## 2024-07-11 MED ORDER — MORPHINE SULFATE 15 MG PO TABS: 15.0000 mg | ORAL_TABLET | Freq: Two times a day (BID) | ORAL | 0 refills | Status: DC | PRN

## 2024-07-11 NOTE — Progress Notes (Signed)
 "  Subjective:    Patient ID: [name], female    DOB: 1940-10-03, 84 y.o.   MRN: [id number]  An audio/video tele-health visit is felt to be the most appropriate encounter for this patient at this time. This is a follow up tele-visit via phone. The patient is at home. MD is at office. Prior to scheduling this appointment, our staff discussed the limitations of evaluation and management by telemedicine and the availability of in-person appointments. The patient expressed understanding and agreed to proceed.   [name] presents today for f/u of severe trigeminal neuralgia.   1) Severe trigeminal neuralgia: -continues to be severe -she is no longer feeling any benefit from the hydrocodone  -she asks whether she can try morphine  instead -she took 6 tablets of hydrocodone  yesterday and felt no benefit -her husband is worried about her trying morphine   PRIOR HISTORY: -no changes with neruostimulator -she was on Program 1 at 12.5 when she followed with [facility]- she recommended she stay on the same program and follow up in 3 months, her husband feels there is not much more relief than she can get from the stimulator -[facility] is focusing more on changing mindset at this time -Nucynta  seemed to make her worse, especially in her teeth -some of the recent settings have been very good and cause no pain at night but then the next day the pain is worse than baseline -she has been given the choice of not having it on at night She has pain in the morning whether or not she turns the device on at night or not -pain continues to be severe -on the lower dose fentanyl  now- 25mcg- tolerating this well -Nucynta  has not been improved  -the gabapentin  continues to help -she had a program setting on her stimulator last week that was effective, she plans for follow up with [facility] about this -they have not been able to find anyone who does management of the pain pump- she still wants to get it done -she feels the  medication is not helping and that the stimulator is not helping -several times she has turned off the stimulator and that was not enough -she does not get negative side effects from the Fentanyl  patch -she is not doing as well as she had hoped she would after visiting Dr. Colon -they had changed some programs and these had really helped while she was in [place] -she stayed with her son in [place]  after her trip to [place] and got to see her great grandson! But after she got home her pain got so much worse. She is not sure if the distraction of family helped her -she and [name] got really tried during the trip- the floors were slick in the shower -today pain is 8/10 -she needs refill of fentanyl  patch -mouth gets dry from gabapentin  -she is planning to see Dr. Colon at [facility] again -she went to [facility] 2 weeks ago -Friday or Saturday night it was time for her to have another patch and her husband felt she should go off the patch -she is doing well with hydrocodone  and fentanyl  patch -had been unable to go to the gym when her pain was severe -she is not doing very well -they had to cancel one of the appointments due to snow -the next week [name] who is doing her programming had the flu -she turned off the device for 2 weeks as it was making her pain worse -she had an appointment with [name] on  Monday and tried new programs and they seemed to be working better -she is very happy with her new [facility] Physician but has not found any helpful settings yet -she turns off the stimulator at night as she can sleep well at night -if she turns off the stimulator during the day she has 1 hour of relief and then the old pain returns -the last two or three days she has had bad pain -she is filling out pain diaries -she was able to cook Thanksgiving dinner but felt a lot of pain the next day -she is doing ok -she likes her new physician at [facility]. He works a lot with the [employer] rep  because the rep is just for back pain -he takes pictures and asks for the location to be adjusted -at [facility] she is just given one program and then it stops working.  -Saw Dr. Colon 8 weeks ago and the new programs worked but then it stops working when she returns home -she is seeing neurosurgeon [name] on Thursday  [name] researched red light therapy -she went to see the doctor in [place]  and was only been able to spend 5 minutes with her -they did about 15 minutes of adjustments and the new programs seemed to hurt more than they help -has had stimulator turned off since last night -was able to enjoy her birthday -she did well for 3 weeks but recently pain has been around 8-10 -when pain is very severe it makes her eye hurt -the way her stimulator is set it makes her nose hurt She is going back to [facility] on April 10th -she is managing with the gabapentin  and had to take 2 extra yesterday and it did not make her sleepier  -pain fluctuates  -she went to [place] last week with her children.  -she keeps a journal of when she changes the program and what her pain level is in her teeth, eyes, and cheek. She put in not to use the teeth program -she said if they could hit the nerve between the two teeth that would be helpful -they have her an 11 and 12 in that region,  -she cannot get the right level of stimulation. Before she left she decreased it by 50%. Now she is on a cheat program and the lowest amount. It is helping the teeth but hurting the eye.  -Dr. Colon had a baby on 11/14 and she is not coming back until January.  -she talked to the representative -last night she turned off the stimulator -she slept almost day today and did not want to wake up for supper which was rare for her -she is having a difficult time weaning off the hydrocodone .  -she has been off of it in two days -she is expecting a return call from [name] team soon to discuss wean off fentanyl  -drove up on Tuesday and on  Wednesday was supposed to have an appointment with pain psychologist. When they got there they were told that appointment had been changed to virtual. The next day they had a more sophisticated MRI. [name] did moving in MRI so MRI was not as helpful. The next day they saw Dr. Colon and discussed several procedural options and then went back to the motor stimulator- that is scheduled on October 16th.  -she recently went on vacation and had to spend the majority of the vacation in bed due to the severity of her pain -she stopped using the intranasal ketamine because although  it helped initially, it appeared to make her pain worse later -she asks if she can wean off some medication as she had a recent appointment with Dr. Colon and was told she may have a lot of postoperative pain if she is taking so much pain medication. She would still like to do this and also experiences sedation from her medication. She asks how she would go about weaning her medications -she asks if she should wean off the gabapentin  since this makes her so sleepy She asks if she should restart Topamax . -she was encouraged by her phone visit with Dr. Colon and plans to pursue surgery. She is going to [facility] next week for Brain MRI, neuropsych eval, and to discuss the risks and benefits of the procedure with Dr. Colon -pain was so severe in the past that she expressed suicidality to her husband [name].  -she feels that none of her medications are helpful but she knows that when she tries to wean them her pain worsens.  -she asks about refill of the compounding cream -her husband asks about referral to ketamine clinic for depression rather than pain -she went back to the dentist because it was urting between two teeth and he said there is nothing wrong with those teeth and it is the trigeminal nerve When she took a shower that usually seems to help with but was migrating around her face.  -appointment with neurology in July -Dr. Colon  cancelled the zoom call in April but this was rescheduled to July.  -she finds that only the gabapentin  helps -she is not sure if the fentanyl  helps When she is feeling really bad she tries to take another one but she gets very sleepy and dizzy.  -she is taking 8 a day of 100mg  per day- she takes two gabapentin  and tylenol  and hydrocodone  in the morning. She takes another two at breakfast around 11, another 2 at lunch around 1:30pm.  -she had a recent root canal and pain has become much more severe after that -her pain is in the two teeth next to where she had the root canal -Oragel heps for a bout an hour.  -she tried increasing her Gabapentin  to 300mg  TID but she could not tolerate this. It made her too sleepy.  -she contacted Dr. Darlis and he recommended she pause wean of Fentanyl  patch and I agree with this -she took last patch last night. She still has 12.5mcg patch available -her husband asks whether she should take a higher dose of gabapentin  -the fentanyl  patch makes her very sleepy and so allows her to sleep. It has also made her dizzier.  -the 50mcg fentanyl  patch did not provide more relief- she would like to wean off. Weaned to 25, then 12.53mcg, then off this week -referral was to [facility] and not to Dr. Kirke Solders she states and they need a new referral send to Dr. Solders -she is using the compound cream and this helps- she uses this twice per day -capsaicin  made it burn  -lamotrigine  makes her too sleepy and dizzy to come down the stairs by herself and she does not feel comfortable going to the shower -left eye more open than usual. -she does not feel she is getting relief from the fentanyl  patch -she feels that the procedure at [facility] is a last resort.  -Given darkening of skin and worsening eye lid closure, MRI brain stat obtained and shows new cyst that could be post-surgical. Recommended she go to  ED given worsening dizziness and facial swelling as well. ED  physician discussed case with NSGY and neurology and discharged home recommending follow-up with Dr. Dartha and Dr. Tarri. She was given IV Fentanyl  in the ED which helped and discharged on 12mcg Fentanyl  patch, which has not helped.  -discussed increasing Fentanyl  patch to 25mcg since she has not experienced any negative side effects, no respiratory depression, and her husband is agreeable. I have sent in new script to start Saturday, but insurance required prior auth, which was approved.  -tonight would be the third tay of the 25mcg Fentanyl  patch -she is still taking the Gabapentin  -she feels that when she lays down she is spinning.  -eye continues to appear more closed.  -Husband has called Dr. Sunnie and Dr. Teddi office to scheduled follow-up appointments and has appointments with both tomorrow.  -Will run out of Fentanyl  patch by Sunday -pain is worsening and nothing is providing relief -she asks whether she should go to ED -she has also noted dizziness and is not sure if this is from being in the MRI or from the Savella .  -morphine  was not effective and caused itching, sarna lotion did help wit this.  -hydrocodone  has been most effective thus far -nucynta  was not effective but she is not sure whether she gave it a fair trial -she has month appointments scheduled with F[facility]a and f/u with me in Feb -she used the Norco last night and this morning with good benefit and has started to feel the pain return this afternoon -she did find benefit from the compound cream- she continues to use this.  -She continues to have a sharp shooting pain doing around her eyelid and the tip teeth and in the top of her head.  -has a radiofrequency ablation treatment that helped but not completely -she is not having as much sharp knife-like pin like in her eye Her top teeth in her left law have been hurting really badly.  -she now takes the baclofen  with the gabapentin  and they seem to work well  together.  -she had surgical procedure and feels worse -yesterday she took a half a tablet but this morning she took a whole one. She does fine with the hydrocodone  but a whole one makes her too sleepy.  -she is taking 2 100mg  Gabapentin  three times per day but it makes her dizzy and sleepy so she cannot funciton -the cold worsens her pain.  -The IV fentanyl  did help with the pain.  -her husband feels she should get one more dose of the 25mg  fentanyl . -she did get one week benefit from the steroid injection.  -she is doing terribly -she is miserable -she feels depressed due to the pain -she has considered taking the whole bottle of hydrocodone  because because she can't take this pain anymore.  -she started taking the Gabapentin  300mg  TID.  -she does find relief from the hydrocodone  -the pain is constant -she wonders if it is inflammation.  -she has not tried turmeric. Her son-in-law gave her a bottle and she couldn't stomach it.   2) Fibromyalgia: -She has been almost nonfunctional due to the severity of her pain. It continues to be very severe.  -She would like to wean off the Gabapentin  as she feels at risk of falls -she is taking Topamax  BID. She felt this was helpful but made her unsteady on her feet.  -still using gabapentin  and this is helping.  -she feels so fatigued that she can only have a  shower every other day.   3) Osteoporosis: -husband asks whether she should get a Prolia  shot.   4) Dizziness: -head was swimming -she felt like she was going to fall.  -worsening -feels like she is spinning, not the room  -She tried the Phenytoin  for 2 days and did not feel benefit so stopped it.   -she feels the gabapentin  aggravates this and she would love to wean off but knows it helps her.   -She feels very sleepy during the day.   -She has never tried Topirmate before.   5) Sore throat -developed stopping lamotrigine  since not helping.  6) Rheumatoid arthritis: -she was  diagnosed by Dr. Derrek -she has pain in hips, ankles, and knees   Prior history:  Her pain continues to be in her left eye, forehead. She has not tried any topicals for pain. She uses capsaicin  for her back and knees.   Since last visit nothing we have tried has helped with her pain. She has tried to decrease her Gabapentin  dose but then her pain increased so she has resumed taking 4 Gabapentin  per day.   Her husband asks whether he should contact her surgeon regarding whether nerve may have become unclamped.   Discussed prolotherapy, trigger point injections, Baclofen , Clonazepam, Phenytoin  as other options we could try,=.  Pain continues to be excruciating and she states she is not sure how much longer she can live like this.   Prior history: She has having some nausea, lightheadedness and felt this may be related to her Gabapentin . She did not take any yesterday and then felt more sever pain in the evening. She also gets electrical shooting across her jaw.   She does not feel that she has cluster headaches as the pain has been so constant. She feels that she is getting worse. The oxygen made her feel relaxed but it is not helping.   The Sarna lotion does help with the itching in her head.   She was willing to try the Amitriptyline  but it makes her too sleepy in the morning.   Her teeth and left eye incredibly hurt.   She feels that the Gabapentin  has been making her dizziness.   She asks about biofeedback.   The pain is 7/10 on average, similar to last time.   When the pain is severe it can make her depressed.    6) Macular degeneration:  Has been following with ophthalmology  7) Family illness: -she lost two nephews recently  8) Knee osteoarthritis: -had a cortisone shot and this really helped  9) Constipation: -she gets this from the hydrocdone -she has a BM every day and half- there is straining -[name] says diet is poor -she tries to eat a lot of fruit for  breakfast  Pain Inventory Average pain 10 Pain Right Now 10 My pain is constant, tingling, sharp, dull, stabbing, aching, numbness, burning  In the last 24 hours, has pain interfered with the following? General activity 10 Relation with others 10 Enjoyment of life 10  What TIME of day is your pain at its worst? Morning, daytime, evening, night Sleep (in general) Good  Pain is worst with walking, bending, sitting, standing, some activities  Pain improves with rest, heat, exercise, medication  Relief with medication is not able to answer.   Family History  Problem Relation Age of Onset   Heart disease Mother    Heart disease Father    Stroke Father    Stroke Sister  Asthma Daughter    Colon cancer Neg Hx    Social History   Socioeconomic History   Marital status: Married    Spouse name: Not on file   Number of children: 2   Years of education: college   Highest education level: Not on file  Occupational History   Occupation: housewife    Employer: RETIRED  Tobacco Use   Smoking status: Never   Smokeless tobacco: Never  Vaping Use   Vaping status: Never Used  Substance and Sexual Activity   Alcohol use: No    Alcohol/week: 0.0 standard drinks of alcohol   Drug use: No   Sexual activity: Not on file  Other Topics Concern   Not on file  Social History Narrative   1 cup of Jasmine tea daily    Social Drivers of Health   Tobacco Use: Low Risk (07/06/2024)   Patient History    Smoking Tobacco Use: Never    Smokeless Tobacco Use: Never    Passive Exposure: Not on file  Financial Resource Strain: Low Risk  (04/21/2024)   Received from [facility]   Overall Financial Resource Strain (CARDIA)    Difficulty of Paying Living Expenses: Not hard at all  Food Insecurity: No Food Insecurity (04/21/2024)   Received from [facility]   Epic    Within the past 12 months, you worried that your food would run out before you got the  money to buy more.: Never true    Within the past 12 months, the food you bought just didn't last and you didn't have money to get more.: Never true  Transportation Needs: No Transportation Needs (04/21/2024)   Received from [facility] - Transportation    In the past 12 months, has lack of transportation kept you from medical appointments or from getting medications?: No    Lack of Transportation (Non-Medical): No  Physical Activity: Not on file  Stress: Not on file  Social Connections: Not on file  Depression (PHQ2-9): Low Risk (07/06/2024)   Depression (PHQ2-9)    PHQ-2 Score: 0  Alcohol Screen: Not on file  Housing: Low Risk  (04/21/2024)   Received from [facility]   Epic    In the last 12 months, was there a time when you were not able to pay the mortgage or rent on time?: No    In the past 12 months, how many times have you moved where you were living?: 0    At any time in the past 12 months, were you homeless or living in a shelter (including now)?: No  Utilities: Not At Risk (04/21/2024)   Received from [facility]   Epic    In the past 12 months has the electric, gas, oil, or water company threatened to shut off services in your home?: No  Health Literacy: Not on file   Past Surgical History:  Procedure Laterality Date   CHOLECYSTECTOMY  03/2010   COLONOSCOPY  2009   Dr. Author    CYSTOSCOPY     Multiple Cystoscopies with stents or biopsy    ESOPHAGOGASTRODUODENOSCOPY  10/2013   EYE SURGERY     bilateral cataract removal   Gamma knife radiation  01/2018   for trigeminal neuralgia   REFRACTIVE SURGERY Bilateral 2022   RHIZOTOMY Left 04/28/2021   Procedure: Left Percutaneous trigeminal balloon rhizotomy;  Surgeon: Cheryle Debby LABOR, MD;  Location: [facility] OR;  Service:  Neurosurgery;  Laterality: Left;   TONSILLECTOMY     Past Surgical History:  Procedure Laterality Date   CHOLECYSTECTOMY  03/2010    COLONOSCOPY  2009   Dr. Author    CYSTOSCOPY     Multiple Cystoscopies with stents or biopsy    ESOPHAGOGASTRODUODENOSCOPY  10/2013   EYE SURGERY     bilateral cataract removal   Gamma knife radiation  01/2018   for trigeminal neuralgia   REFRACTIVE SURGERY Bilateral 2022   RHIZOTOMY Left 04/28/2021   Procedure: Left Percutaneous trigeminal balloon rhizotomy;  Surgeon: Cheryle Debby LABOR, MD;  Location: [facility] OR;  Service: Neurosurgery;  Laterality: Left;   TONSILLECTOMY     Past Medical History:  Diagnosis Date   Adenomatous colon polyp 03/24/2018   Allergy    Arthritis    Cataract    Chronic headaches    Depression    Fibromyalgia    Gallstones    GERD (gastroesophageal reflux disease)    Hyperlipemia    Hypertension    Interstitial cystitis    Lactose intolerance    LBBB (left bundle branch block)    Osteoarthritis    Osteopenia    Pneumonia    Thyroid  disease    Tinnitus    Trigeminal neuralgia    There were no vitals taken for this visit.  Opioid Risk Score:   Fall Risk Score:  `1  Depression screen [facility] 2/9     07/06/2024    9:59 AM 05/08/2024    9:22 AM 04/03/2024   10:20 AM 02/08/2024   11:35 AM 11/19/2023   10:10 AM 10/05/2023    9:18 AM 05/25/2023   11:27 AM  Depression screen PHQ 2/9  Decreased Interest 0 0 0 0 0 0 0  Down, Depressed, Hopeless 0 0 1 0 0 0 0  PHQ - 2 Score 0 0 1 0 0 0 0  Altered sleeping   0  0    Tired, decreased energy   0  0    Change in appetite   0      Feeling bad or failure about yourself    0  0    Trouble concentrating   0  0    Moving slowly or fidgety/restless   0  0    Suicidal thoughts   0  0    PHQ-9 Score   1   0     Difficult doing work/chores   Somewhat difficult         Data saved with a previous flowsheet row definition    Review of Systems  Constitutional: Negative.   HENT: Negative.    Eyes:  Positive for visual disturbance.  Respiratory: Negative.    Cardiovascular: Negative.    Gastrointestinal: Negative.   Endocrine: Negative.   Genitourinary: Negative.   Musculoskeletal: Negative.        Pain in left side of face  Skin: Negative.   Allergic/Immunologic: Negative.   Neurological:  Positive for dizziness, tremors, light-headedness and numbness.       Tingling   Hematological: Negative.   Psychiatric/Behavioral: Negative.    All other systems reviewed and are negative.      Objective:   Physical Exam PRIOR EXAM: Gen: no distress, normal appearing HEENT: oral mucosa pink and moist, NCAT Cardio: Reg rate Chest: normal effort, normal rate of breathing Abd: soft, non-distended Ext: no edema Psych: pleasant, normal affect Skin: intact Neuro: Alert and oriented x3, left eye drop and facial droop- stable,  right hip TTP . Assessment & Plan:  Mrs. Heide is an 84 year old woman who presents for f/u of left sided trigeminal neuralgia and dizziness.    1) Sensory deafferation pain, left sided trigeminal neuralgia vs cluster headaches (she does have lacrimation and runny nose on that side). -discussed that Nucynta  has worsened her pain -discussed that she is no longer getting relief from hydrocodone   -discussed that she cannot find anyone here who places intrathecal pumps -discussed that we have tried higher doses of medications but she is limited by side effects -decrease hydrocodone  to 2 tabs BID for today, morphine  15mg  immediate release ordered BID prn for 7 days, advised that once she is able to get this from her pharmacy she can try replacing hydrocodone  with morphine , advised to let me know how this works for her and to monitor for sedation  PRIOR HISTORY -discussed that hydrocodone  5mg  has been helpful when the pain is severe, discussed increasing so she has the option to take 1-2 tabs per day -discussed that her stimulator is currently off -discussed that Dr. Buelah PA  -discussed avoiding gluten -discussed that pain was so severe at one point  that she did not want to live -discussed that her pain continues to be severe, advised continuing gabapentin  to 200mg  TD, discussed that she needs refill of Fentanyl  patch 25mcg- sent 10 patches, discussed that higher doses of gabapentin  cause dizziness and higher doses of fentanyl  patch make her sleepy -discussed that Journavx could help -discussed that seeing the water on the beach caused dizziness -decrease fentanyl  patch to 25mcg -discussed that insurance company rejected Nucynta , discussed potential costs of these -discussed that she has been happy with her new provider at [facility] -discussed that she is following up with Dr. Colon at [facility] -refilled fentanyl , discussed that she tried stopping this and pain was very severe -avoid proceed foods, eggs, gluten, dairy -recommended eating furits and vegetables -continue hydrocodone , discussed that she feel she needs either a higher dose or frequency, discussed that she has become somewhat more tolerant to the medication than she was 15 years ago -discussed that gabapentin  used to make her to lean to the left but now it makes her lean to the right -discussed that the gabapentin  makes her sleepy -discussed that she feels that the hydrocodone  is more effective for her than the gabapentin  -discussed [name] recommendation for an intrathecal pain pump, discussed that she has a trial scheduled for the pump, discussed that she is considering this at [facility] -encouraged discussing with with the [facility] team risks and benefits of this procedure -discussed that she is doing Zoom calls with a psychiatrist -d/c narcan  and naltrexone, discussed that she uses the hydrocodone  sparingly.  -discussed that she had new programs installed on Monday -discussed that she is doing well with the hydrocodone  -discussed pruritus with the fentanyl  patch -continue following with pain psychologist, discussed that I think it is phenomenal that she is doing  this -refilled hydrocodone  and fentanyl  -discussed rating her pain for the programming of electrical stimulation   -discussed mechanism of action of low dose naltrexone as an opioid receptor antagonist which stimulates your body's production of its own natural endogenous opioids, helping to decrease pain. Discussed that it can also decrease T cell response and thus be helpful in decreasing inflammation, and symptoms of brain fog, fatigue, anxiety, depression, and allergies. Discussed that this medication needs to be compounded at a compounding pharmacy and can more expensive. Discussed that I usually start  at 1mg  and if this is not providing enough relief then I titrate upward on a monthly basis.    -discussed that savella  caused hallucinations -discussed that we tried lyrica  and we cannot recall but she had some side effect -discussed that topamax  did not help and made her feel worse -discussed that carbamazpeine did not help -discussed that she is happy that she does not have to return to [place] as those trips caused her a lot of fatigue -discussed that pain was bad after she cooked for Thanksgiving -discussed her follow-up with [name] -discussed the negative experience when she last went to [place]  -discussed that next available appointment was on June 24th or 26th, but they were able to move this up to May 22nd or 24th -discussed that she keeps a journal with the setting with the level of her pain.  -continue Diclofenac 3%, Gabapentin  5%, Lidocaine  5%, Menthol 1% compounded at [facility]:  apply 1-2 pumps three to four times per day as needed, discussed that this dose help -discussed that she has had to turn off the stimulator more since she has established care at [facility] -discussed response to her stimulator -discussed doubling the gabapentin  when pain is severe Increase low dose naltrexone to 3mg  HS -discussed her response to the stimulator She does have a history of migraines.   -Discussed that if she has to be in bed all day due to her pain it may be beneficial for her husband to give her at least one hydrocodone  per day to enable her to function -provided refill of Fentanyl  patch as she will be leaving for her surgery in [place] soon and her husband plans to stay there for some time in case she has any surgical complications.  -discussed her follow-up with pain psychology.  -recommended weaning off Fentanyl , discussed stopping fentanyl  because she is on 12.5mg  today but she had tried this a couple of weeks ago and pain was so severe that she needed patch to be replaced.  -discussed that she is taking hydrocodone  4 tablets per day, and recommend to decrease this to 3 tabs per day for three days and then to keep decreasing the tab by once per day.  -discussed that skipping the morning dose of hydrocodone  will at least allow her to sleep -recommended trying Frankincense and Myrrh essential oils mixed in coconut oil and applied to area of pain -recommended restarting Topamax  -called [facility] to prescribe compounding gel refill -continue Gabapentin  100mg  up to 8 times a day, discussed her current schedule of takin -discussed using Oragel multiple times per ay since this helps for an hour, use the topical cream intermittently in between -continue follow-up with [facility] to set up an appointment for her with them. Unfortunately Dr. Zulema has retired but was able to set her up with Dr. Binnie who specializes in migraines and trigeminal neuralgia on July 25th. Let patient and husband know. They will mail her their address.   -commended her for calling Dr. Darlis as well to let him know how she is feeling -recommended taking 1 hydrocodone  now for the severe pain, and discussed that the Fentanyl  patch with take 12-24 hours to kick in -discussed with April the neurology referral and she did specify Dr. Kirke Zulema and spoke with the [facility]  department -discussed CBD oil, her son bought her some of this and she felt that it greatly helped  Recommend topical CBD oil- discussed its benefits in reducing inflammation, pain, insomnia, and  anxiety, but she stopped because effect wore of.  -Discussed that CBD oil differs from marijuana in that it does not contain THC- the substance that causes euphoria.  -Discussed that it is made from the hemp plant.  -It has been used for thousands of years -preliminary research suggests that if may be able to shrink cancerous tumors, stop plaque formation in Alzheimer's Disease, and slow the progress of brain disease from concussions.  -Additional benefits that have been demonstrated in studies include improved nausea, indigestion, and brain health, and reduced seizures.  -In a survey 92% of patients who tried medical cannabis felt it improved symptoms such as chronic pain, arthritis, migraines, and cancer.   -Provided with a pain relief journal and discussed that it contains foods and lifestyle tips to naturally help to improve pain. Discussed that these lifestyle strategies are also very good for health unlike some medications which can have negative side effects. Discussed that the act of keeping a journal can be therapeutic and helpful to realize patterns what helps to trigger and alleviate pain.   -discussed with Dr. Saul trigeminal deafferation pain, discussed the difference between this and trigeminal neuralgia with patient and husband, discussed motor cortex stimulation. -discussed whether or not to continue fentanyl  patch, husband does feel that the IV fentanyl   -encouraged following with Dr. Sweet/Dr. Cleotilde at [facility] to try procedure recommended by D. Ostegard.  -discussed mechanism of action of low dose naltrexone as an opioid receptor antagonist which stimulates your body's production of its own natural endogenous opioids, helping to decrease pain. Discussed that it can also decrease T  cell response and thus be helpful in decreasing inflammation, and symptoms of brain fog, fatigue, anxiety, depression, and allergies. Discussed that this medication needs to be compounded at a compounding pharmacy and can more expensive. Discussed that I usually start at 1mg  and if this is not providing enough relief then I titrate upward on a monthly basis.   -stop lamotrigine  as it is making her dizzy without benefit -reviewed patient's medical notes -discussed her progress with getting a referral to [facility] -recommended going to ED given darkening of skin, worsening eye closure, finding of cyst, dizziness to receive prompt neurosurgical eval regarding if surgery is necessary, and also for pain management given worsening pain refractory to all treatments and resulting suicidality. Reviewed ED doc notes- he discussed with on-call neurology and NSGY and determined that patient could be discharged with outpatient follow-up. IV fentanyl  was administered with relief to patient. She was discharged on fentanyl  patch which has not provided relief. She is getting 12mcg- no side effects- discussed increasing to 25mcg and her husband is in agreement. Prior auth completed and approved. Discussed with husband starting patch on Satruday after she has worn 12mcg patch for 72 hours -encouraged follow-up with Dr. Saul, husband let me know appointment has been set up 1/18.  -recommended follow-up with Dr. 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  -Discussed that turmeric  -discussed steroid taper. -continue savella , husband feels like it is helping with her suicidal thoughts. Increase to 25mg .   Discontinued hydrocodone   -discuss mouth guard with dentist.  -d/c topamax  -she does have another procedure scheduled. -refilled Norco -follow-up monthly with Fidela and  with me in February, placed on waitlist for earlier appointment with me, advised phone visits/MyChart are a great way to communicate in the interim -discussed that she can take an additional one or two tylenol  for breakthrough pain -recommended checking CMP for liver enzymes with next labs -prescribed NAC 600mg  BID to protect the liver while taking tylenol , discussed its health benefits for mitochondrial function -d/c Baclofen   -f/u with Dr. Saul and Dr. Darlis 1/18 -discussed retrying carbamazepine  to see if it is effective for her. It is $89 for her, so we discussed trying Lamictal  instead.  -encouraged anti-inflammatory diet.  -she had sinusitis that was viral and was treated with antibiotics. This was in the early 90s. The symptoms resolved after 2 months. She uses to get sinus infections and saline rinse.  -She has had multiple root canals.  -Discontinue 100% oxygen via facemask as this was not helpful.  -Discussed that unfortunately Sprint PNS is not indicated for craniofacial pain. -Amitriptyline  and Lyrica  did not help.  -Continue Cymbalta  60mg .  -Topiramate  helped but caused dizziness.  -Provided with hand out with information regarding trigeminal neuralgia -Failed Penytoin.  -trial lidocaine  patch -She would like to try biofeedback. -reviewed neurology note.  -referred to Dr. Kirke Solders.  -will try trigger point injections next visit.  -Continue Ketamine 10%, Baclofen  2%, Cyclobenzaprine 2%, Ketoprofen 10%, Gabapentin  6%, Bupivacaine 1%, Amitryptiline 5% to apply to painful areas- using 3-4 times per day.  -Can consider Meloxicam if Phenytoin  does not help.  -Discussed Qutenza  as an option for neuropathic pain control. Discussed that this is a capsaicin  patch, stronger than capsaicin  cream.  Discussed that it is currently approved for diabetic peripheral neuropathy and post-herpetic neuralgia, but that it has also shown benefit in treating other forms of neuropathy. Provided patient with link to site to learn more about the patch: [email or web address] .[email or web address]/. Discussed that the patch would be placed in office and benefits usually last 3 months. Discussed that unintended exposure to capsaicin  can cause severe irritation of eyes, mucous membranes, respiratory tract, and skin, but that Qutenza  is a local treatment and does not have the systemic side effects of other nerve medications. Discussed that there may be pain, itching, erythema, and decreased sensory function associated with the application of Qutenza . Side effects usually subside within 1 week. A cold pack of analgesic medications can help with these side effects. Blood pressure can also be increased due to pain associated with administration of the patch.  -she cut down on her gabapentin  due to her dizziness    Turmeric to reduce inflammation--can be used in cooking or taken as a supplement.  Benefits of turmeric:  -Highly anti-inflammatory  -Increases antioxidants  -Improves memory, attention, brain disease  -Lowers risk of heart disease  -May help prevent cancer  -Decreases pain  -Alleviates depression  -Delays aging and decreases risk of chronic disease  -Consume with black pepper to increase absorption    Turmeric Milk Recipe:  1 cup milk  1 tsp turmeric  1 tsp cinnamon  1 tsp grated ginger (optional)  Black pepper (boosts the anti-inflammatory properties of turmeric).  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  -she is a good metabolizer of amitriptyline  and has tolerated nortrypilline in the past, but without much benefit.    3) CKD: encouraged 6-8 glasses of water per day. She states that she had AKI with Ibuprofen in the past. Discussed checking Cr level next visit if we are considering Meloxicam -Discussed that Nucynta , and other pain medications, may stay in her bloodstream longer given her CKD   4) Hypernatremia: encouraged hydration   5) General health and pain: provided with list of healthy foods that are both nutritious and fight pain.   6) Depressed: Amitriptyline  did help with this but she could not tolerate the drowsiness. Encouraged focusing on the factors that she can control, such as anti-inflammatory diet.   7) Fibromyalgia: Discussed goal to wean steroids prior to surgery. Continue 1mg  daily. She is on that intermittently. She started this again about a month ago. She has been on this about a year. -avoid gluten/dairy -continue gabapentin  -recommended eating a lot of fruits and vegetables -recommended avoiding eggs, gluten, dairy, processed foods Continue Savella  -discussed benefits of cold water showers.  -continue gabapentin .  Continue fentanyl  and hydrocodone  -avoid processed foods -avoid gluten, dairy, and eggs -eat fruits, vegetables, spices, and herbs -Discussed current symptoms of pain and history of pain.  -Discussed benefits of exercise in reducing pain. -Discussed following foods that may reduce pain: 1) Ginger (especially studied for arthritis)- reduce leukotriene production to decrease inflammation 2) Blueberries- high in phytonutrients that decrease inflammation 3) Salmon-  marine omega-3s reduce joint swelling and pain 4) Pumpkin seeds- reduce inflammation 5) dark chocolate- reduces inflammation 6) turmeric- reduces inflammation 7) tart cherries - reduce pain and stiffness 8) extra virgin olive oil - its compound olecanthal helps to block prostaglandins  9) chili peppers- can be eaten or applied topically via capsaicin  10) mint- helpful for headache, muscle aches, joint pain, and itching 11) garlic- reduces inflammation 12) Green tea- reduces inflammation and oxidative stress, helps with weight loss, may reduce the risk of cancer, recommend Double [employer] of Tea daily  Link to further information on diet for chronic pain: [email or web address]   8) Dizziness: -discussed that this could be from the combination of gabapentin  and baclofen .  -discussed that all the medications we are trying for pain may worsen the dizziness  9) Constipation:  -Provided list of following foods that help with constipation and highlighted a few: 1) prunes- contain high amounts of fiber.  2) apples- has a form of dietary fiber called pectin that accelerates stool movement and increases beneficial gut bacteria 3) pears- in addition to fiber, also high in fructose and sorbitol which have laxative effect 4) figs- contain an enzyme ficin which helps to speed colonic transit 5) kiwis- contain an enzyme actinidin that improves gut motility and reduces constipation 6) oranges- rich in pectin (like apples) 7) grapefruits- contain a flavanol naringenin which has a laxative effect 8) vegetables- rich in fiber and also great sources of folate, vitamin C, and K 9) artichoke- high in inulin, prebiotic great for the microbiome 10) chicory- increases stool frequency and softness (can be added to coffee) 11) rhubarb- laxative effect 12) sweet potato- high fiber 13) beans, peas, and lentils- contain both soluble  and insoluble fiber 14) chia seeds- improves intestinal health and gut flora 15) flaxseeds- laxative effect 16)  whole grain rye bread- high in fiber 17) oat bran- high in soluble and insoluble fiber 18) kefir- softens stools -recommended to try at least one of these foods every day.  -drink 6-8 glasses of water per day -walk regularly, especially after meals.   10) Osteopenia - was tapered off the prednisone  -discussed that currently her pain may be more of a risk to her than osteopenia given her suicidal ideation from her pain -discussed risk of fall with the medications that make her dizziness.   11) Daytime sleepiness -discussed that this is secondary to her medications.  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  -discussed red light therapy  15) HTN: -discussed clonidine  patch could potentially help with HTN and pain.  -BP is 170/90 today.  -Advised checking BP daily at home and logging results to bring into follow-up appointment with PCP and myself. -Reviewed BP meds today.  -Advised regarding healthy foods that can help lower blood pressure and provided with a list: 1) citrus foods- high in vitamins and minerals 2) salmon and other fatty fish - reduces inflammation and oxylipins 3) swiss chard (leafy green)- high level of nitrates 4) pumpkin seeds- one of the best natural sources of magnesium 5) Beans and lentils- high in fiber, magnesium, and potassium 6) Berries- high in flavonoids 7) Amaranth (whole grain, can be cooked similarly to rice and oats)- high in magnesium and fiber 8) Pistachios- even more effective at reducing BP than other nuts 9) Carrots- high in phenolic compounds that relax blood vessels and reduce inflammation 10) Celery- contain phthalides that  relax tissues of arterial walls 11) Tomatoes- can also improve cholesterol and reduce risk of heart disease 12) Broccoli- good source of magnesium, calcium , and potassium 13) Greek yogurt: high in potassium and calcium  14) Herbs and spices: Celery seed, cilantro, saffron, lemongrass, black cumin, ginseng, cinnamon, cardamom, sweet basil, and ginger 15) Chia and flax seeds- also help to lower cholesterol and blood sugar 16) Beets- high levels of nitrates that relax blood vessels  17) spinach and bananas- high in potassium  -Provided lise of supplements that can help with hypertension:  1) magnesium: one high quality brand is Bioptemizers since it contains all 7 types of magnesium, otherwise over the counter magnesium gluconate 400mg  is a good option 2) B vitamins 3) vitamin D  4) potassium 5) CoQ10 6) L-arginine 7) Vitamin C 8) Beetroot -Educated that goal BP is 120/80. -Made goal to incorporate some of the above foods into diet.    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  -discussed that she had weaned off the steroids and had been doing fine off the steroids -discussed that aquatic therapy can help -discussed that she should encourage follow-up with rheumatology to assess for giant cell arteritis -[facility] -continue 2mg  steroids  16. Sciatica: -consider red light therapy -discussed that this has improved -discussed aquatherapy -discussed that the spinal injection helped temporarily -discussed that she has been taking 4mg  of low dose naltrexone -discussed that she used some hydrocodone  she had left over from the past.  -discussed lumbosacral orthosis, advised using for activities that involve bending/twisting/walking on uneven terrain -discussed that it would probably be better to repeat steroid injection than to continue hydrocodone  -advised to use lidocaine  patch -continue capsaicin  cream  17.  Family  illness: -discussed that she lost 2 nephews recently  32. Depression:  -continue cymbalta  -encouraged follow-up with psychiatry   19. Dizziness: -discussed that this has improved  20. 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  -discussed that she has difficult sewing due to this -discussed that she is requiring injections  22. Constipation: -discussed that fentanyl  and hydrocodone  can cause this Recommended eating an apple every day Recommended eating prunes every day -discussed that eating an apple per day and prune juice at night help, discussed that prune juice helps more than miralax   23. Rheumatoid arthritis: -discussed that she was recently diagnosed with Dr. Derrek -discussed that she was started on a medication that caused shortness of breath -continue 10mg  daily prednisone   -discussed that hydrotherapy was helpful  24) Hyperglycemia: -discussed that I do not see a HgbA1c ordered  25) Neuropathy: -discussed that she has heaviness in both feet -discussed that Nucynta  was not helpful  26) Daytime somnolence: -discussed that she has to sleep a lot during the day  27) HTN: -discussed clonidine  but she already suffers from dry mouth -discussed restarting magnesium 400mg  at night -Advised checking BP daily at home and logging results to bring into follow-up appointment with PCP and myself. -Reviewed BP meds today.   -Advised regarding healthy foods that can help lower blood pressure and provided with a list: 1) citrus foods- high in vitamins and minerals 2) salmon and other fatty fish - reduces inflammation and oxylipins 3) swiss chard (leafy green)- high level of nitrates 4) pumpkin seeds- one of the best natural sources of magnesium 5) Beans and lentils- high in fiber, magnesium, and potassium 6) Berries- high in flavonoids 7) Amaranth (whole grain, can be cooked similarly to rice and oats)- high in magnesium and fiber 8) Pistachios- even more effective at reducing BP than other nuts 9) Carrots- high in phenolic compounds that relax blood vessels and reduce inflammation 10) Celery- contain phthalides that relax tissues of arterial walls 11) Tomatoes- can also improve cholesterol and reduce risk of heart disease 12) Broccoli- good source of magnesium, calcium , and potassium 13) Greek yogurt: high in potassium and calcium  14) Herbs and spices: Celery seed, cilantro, saffron, lemongrass, black cumin, ginseng, cinnamon, cardamom, sweet basil, and ginger 15) Chia and flax seeds- also help to lower cholesterol and blood sugar 16) Beets- high levels of nitrates that relax blood vessels  17) spinach and bananas- high in potassium  -Provided lise of supplements that can help with hypertension:  1) magnesium: one high quality brand is Bioptemizers since it contains all 7 types of magnesium, otherwise over the counter magnesium gluconate 400mg  is a good option 2) B vitamins 3) vitamin D  4) potassium 5) CoQ10 6) L-arginine 7) Vitamin C 8) Beetroot -Educated that goal BP is 120/80. -Made goal to [phone] some of [email or web address] above foods into diet.   "

## 2024-07-14 ENCOUNTER — Encounter: Admitting: Physical Medicine and Rehabilitation

## 2024-07-14 DIAGNOSIS — G500 Trigeminal neuralgia: Secondary | ICD-10-CM | POA: Diagnosis not present

## 2024-07-14 MED ORDER — HYDROCODONE-ACETAMINOPHEN 5-325 MG PO TABS: 1.0000 | ORAL_TABLET | Freq: Three times a day (TID) | ORAL | 0 refills | Status: AC | PRN

## 2024-07-14 NOTE — Progress Notes (Signed)
 "  Subjective:    Patient ID: [name], female    DOB: 08-11-1940, 84 y.o.   MRN: [id number]  An audio/video tele-health visit is felt to be the most appropriate encounter for this patient at this time. This is a follow up tele-visit via phone. The patient is at home. MD is at office. Prior to scheduling this appointment, our staff discussed the limitations of evaluation and management by telemedicine and the availability of in-person appointments. The patient expressed understanding and agreed to proceed.   [name] presents today for f/u of severe trigeminal neuralgia.   1) Severe trigeminal neuralgia: -continues to be severe -morphine  did not help-it seemed to work like the nucynta  in that made the pain worse -pain is 10/10 today -she turned the device off this morning -she took 1 hydrocodone  last night and this morning and this helped a little bit -she asks whether she can try morphine  instead -she took 6 tablets of hydrocodone  yesterday and felt no benefit -her husband is worried about her trying morphine   PRIOR HISTORY: -no changes with neruostimulator -she was on Program 1 at 12.5 when she followed with [facility]- she recommended she stay on the same program and follow up in 3 months, her husband feels there is not much more relief than she can get from the stimulator -[facility] is focusing more on changing mindset at this time -Nucynta  seemed to make her worse, especially in her teeth -some of the recent settings have been very good and cause no pain at night but then the next day the pain is worse than baseline -she has been given the choice of not having it on at night She has pain in the morning whether or not she turns the device on at night or not -pain continues to be severe -on the lower dose fentanyl  now- 25mcg- tolerating this well -Nucynta  has not been improved  -the gabapentin  continues to help -she had a program setting on her stimulator last week that was  effective, she plans for follow up with [facility] about this -they have not been able to find anyone who does management of the pain pump- she still wants to get it done -she feels the medication is not helping and that the stimulator is not helping -several times she has turned off the stimulator and that was not enough -she does not get negative side effects from the Fentanyl  patch -she is not doing as well as she had hoped she would after visiting Dr. Colon -they had changed some programs and these had really helped while she was in [place] -she stayed with her son in [place]  after her trip to [place] and got to see her great grandson! But after she got home her pain got so much worse. She is not sure if the distraction of family helped her -she and [name] got really tried during the trip- the floors were slick in the shower -today pain is 8/10 -she needs refill of fentanyl  patch -mouth gets dry from gabapentin  -she is planning to see Dr. Colon at [facility] again -she went to [facility] 2 weeks ago -Friday or Saturday night it was time for her to have another patch and her husband felt she should go off the patch -she is doing well with hydrocodone  and fentanyl  patch -had been unable to go to the gym when her pain was severe -she is not doing very well -they had to cancel one of the appointments due to snow -the next  week [name] had the flu -she turned off the device for 2 weeks as it was making her pain worse -she had an appointment with [name] on Monday and tried new programs and they seemed to be working better -she is very happy with her new [facility] Physician but has not found any helpful settings yet -she turns off the stimulator at night as she can sleep well at night -if she turns off the stimulator during the day she has 1 hour of relief and then the old pain returns -the last two or three days she has had bad pain -she is filling out pain diaries -she  was able to cook Thanksgiving dinner but felt a lot of pain the next day -she is doing ok -she likes her new physician at [facility]. He works a lot with the [employer] rep because the rep is just for back pain -he takes pictures and asks for the location to be adjusted -at [facility] she is just given one program and then it stops working.  -Saw Dr. Colon 8 weeks ago and the new programs worked but then it stops working when she returns home -she is seeing neurosurgeon [name] on Thursday  [name] researched red light therapy -she went to see the doctor in [place]  and was only been able to spend 5 minutes with her -they did about 15 minutes of adjustments and the new programs seemed to hurt more than they help -has had stimulator turned off since last night -was able to enjoy her birthday -she did well for 3 weeks but recently pain has been around 8-10 -when pain is very severe it makes her eye hurt -the way her stimulator is set it makes her nose hurt She is going back to [facility] on April 10th -she is managing with the gabapentin  and had to take 2 extra yesterday and it did not make her sleepier  -pain fluctuates  -she went to [place] last week with her children.  -she keeps a journal of when she changes the program and what her pain level is in her teeth, eyes, and cheek. She put in not to use the teeth program -she said if they could hit the nerve between the two teeth that would be helpful -they have her an 11 and 12 in that region,  -she cannot get the right level of stimulation. Before she left she decreased it by 50%. Now she is on a cheat program and the lowest amount. It is helping the teeth but hurting the eye.  -Dr. Colon had a baby on 11/14 and she is not coming back until January.  -she talked to the representative -last night she turned off the stimulator -she slept almost day today and did not want to wake up for supper which was rare for her -she is having a difficult time weaning off  the hydrocodone .  -she has been off of it in two days -she is expecting a return call from [name] team soon to discuss wean off fentanyl  -drove up on Tuesday and on Wednesday was supposed to have an appointment with pain psychologist. When they got there they were told that appointment had been changed to virtual. The next day they had a more sophisticated MRI. [name] did moving in MRI so MRI was not as helpful. The next day they saw Dr. Colon and discussed several procedural options and then went back to the motor stimulator- that is scheduled on October 16th.  -  she recently went on vacation and had to spend the majority of the vacation in bed due to the severity of her pain -she stopped using the intranasal ketamine because although it helped initially, it appeared to make her pain worse later -she asks if she can wean off some medication as she had a recent appointment with Dr. Colon and was told she may have a lot of postoperative pain if she is taking so much pain medication. She would still like to do this and also experiences sedation from her medication. She asks how she would go about weaning her medications -she asks if she should wean off the gabapentin  since this makes her so sleepy She asks if she should restart Topamax . -she was encouraged by her phone visit with Dr. Colon and plans to pursue surgery. She is going to [facility] next week for Brain MRI, neuropsych eval, and to discuss the risks and benefits of the procedure with Dr. Colon -pain was so severe in the past that she expressed suicidality to her husband Zell.  -she feels that none of her medications are helpful but she knows that when she tries to wean them her pain worsens.  -she asks about refill of the compounding cream -her husband asks about referral to ketamine clinic for depression rather than pain -she went back to the dentist because it was urting between two teeth and he said there is nothing wrong with those teeth  and it is the trigeminal nerve When she took a shower that usually seems to help with but was migrating around her face.  -appointment with neurology in July -Dr. Colon cancelled the zoom call in April but this was rescheduled to July.  -she finds that only the gabapentin  helps -she is not sure if the fentanyl  helps When she is feeling really bad she tries to take another one but she gets very sleepy and dizzy.  -she is taking 8 a day of 100mg  per day- she takes two gabapentin  and tylenol  and hydrocodone  in the morning. She takes another two at breakfast around 11, another 2 at lunch around 1:30pm.  -she had a recent root canal and pain has become much more severe after that -her pain is in the two teeth next to where she had the root canal -Oragel heps for a bout an hour.  -she tried increasing her Gabapentin  to 300mg  TID but she could not tolerate this. It made her too sleepy.  -she contacted Dr. Darlis and he recommended she pause wean of Fentanyl  patch and I agree with this -she took last patch last night. She still has 12.5mcg patch available -her husband asks whether she should take a higher dose of gabapentin  -the fentanyl  patch makes her very sleepy and so allows her to sleep. It has also made her dizzier.  -the 50mcg fentanyl  patch did not provide more relief- she would like to wean off. Weaned to 25, then 12.30mcg, then off this week -referral was to [facility] and not to Dr. Kirke Solders she states and they need a new referral send to Dr. Solders -she is using the compound cream and this helps- she uses this twice per day -capsaicin  made it burn  -lamotrigine  makes her too sleepy and dizzy to come down the stairs by herself and she does not feel comfortable going to the shower -left eye more open than usual. -she does not feel she is getting relief from the fentanyl  patch -she feels that the procedure at [facility] is a last resort.  -Given darkening of skin and worsening  eye lid closure, MRI brain stat obtained and shows new cyst that could be post-surgical. Recommended she go to ED given worsening dizziness and facial swelling as well. ED physician discussed case with NSGY and neurology and discharged home recommending follow-up with Dr. Dartha and Dr. Tarri. She was given IV Fentanyl  in the ED which helped and discharged on 12mcg Fentanyl  patch, which has not helped.  -discussed increasing Fentanyl  patch to 25mcg since she has not experienced any negative side effects, no respiratory depression, and her husband is agreeable. I have sent in new script to start Saturday, but insurance required prior auth, which was approved.  -tonight would be the third tay of the 25mcg Fentanyl  patch -she is still taking the Gabapentin  -she feels that when she lays down she is spinning.  -eye continues to appear more closed.  -Husband has called Dr. Sunnie and Dr. Teddi office to scheduled follow-up appointments and has appointments with both tomorrow.  -Will run out of Fentanyl  patch by Sunday -pain is worsening and nothing is providing relief -she asks whether she should go to ED -she has also noted dizziness and is not sure if this is from being in the MRI or from the Savella .  -morphine  was not effective and caused itching, sarna lotion did help wit this.  -hydrocodone  has been most effective thus far -nucynta  was not effective but she is not sure whether she gave it a fair trial -she has month appointments scheduled with Fidela and f/u with me in Feb -she used the Norco last night and this morning with good benefit and has started to feel the pain return this afternoon -she did find benefit from the compound cream- she continues to use this.  -She continues to have a sharp shooting pain doing around her eyelid and the tip teeth and in the top of her head.  -has a radiofrequency ablation treatment that helped but not completely -she is not having as much sharp  knife-like pin like in her eye Her top teeth in her left law have been hurting really badly.  -she now takes the baclofen  with the gabapentin  and they seem to work well together.  -she had surgical procedure and feels worse -yesterday she took a half a tablet but this morning she took a whole one. She does fine with the hydrocodone  but a whole one makes her too sleepy.  -she is taking 2 100mg  Gabapentin  three times per day but it makes her dizzy and sleepy so she cannot funciton -the cold worsens her pain.  -The IV fentanyl  did help with the pain.  -her husband feels she should get one more dose of the 25mg  fentanyl . -she did get one week benefit from the steroid injection.  -she is doing terribly -she is miserable -she feels depressed due to the pain -she has considered taking the whole bottle of hydrocodone  because because she can't take this pain anymore.  -she started taking the Gabapentin  300mg  TID.  -she does find relief from the hydrocodone  -the pain is constant -she wonders if it is inflammation.  -she has not tried turmeric. Her son-in-law gave her a bottle and she couldn't stomach it.   2) Fibromyalgia: -She has been almost nonfunctional due to the severity of her pain. It continues to be very severe.  -She would like to wean off the Gabapentin  as she feels at risk of falls -she is taking Topamax  BID.  She felt this was helpful but made her unsteady on her feet.  -still using gabapentin  and this is helping.  -she feels so fatigued that she can only have a shower every other day.   3) Osteoporosis: -husband asks whether she should get a Prolia  shot.   4) Dizziness: -head was swimming -she felt like she was going to fall.  -worsening -feels like she is spinning, not the room  -She tried the Phenytoin  for 2 days and did not feel benefit so stopped it.   -she feels the gabapentin  aggravates this and she would love to wean off but knows it helps her.   -She feels very  sleepy during the day.   -She has never tried Topirmate before.   5) Sore throat -developed stopping lamotrigine  since not helping.  6) Rheumatoid arthritis: -she was diagnosed by Dr. Derrek -she has pain in hips, ankles, and knees   Prior history:  Her pain continues to be in her left eye, forehead. She has not tried any topicals for pain. She uses capsaicin  for her back and knees.   Since last visit nothing we have tried has helped with her pain. She has tried to decrease her Gabapentin  dose but then her pain increased so she has resumed taking 4 Gabapentin  per day.   Her husband asks whether he should contact her surgeon regarding whether nerve may have become unclamped.   Discussed prolotherapy, trigger point injections, Baclofen , Clonazepam, Phenytoin  as other options we could try,=.  Pain continues to be excruciating and she states she is not sure how much longer she can live like this.   Prior history: She has having some nausea, lightheadedness and felt this may be related to her Gabapentin . She did not take any yesterday and then felt more sever pain in the evening. She also gets electrical shooting across her jaw.   She does not feel that she has cluster headaches as the pain has been so constant. She feels that she is getting worse. The oxygen made her feel relaxed but it is not helping.   The Sarna lotion does help with the itching in her head.   She was willing to try the Amitriptyline  but it makes her too sleepy in the morning.   Her teeth and left eye incredibly hurt.   She feels that the Gabapentin  has been making her dizziness.   She asks about biofeedback.   The pain is 7/10 on average, similar to last time.   When the pain is severe it can make her depressed.    6) Macular degeneration:  Has been following with ophthalmology  7) Family illness: -she lost two nephews recently  8) Knee osteoarthritis: -had a cortisone shot and this really helped  9)  Constipation: -she gets this from the hydrocdone -she has a BM every day and half- there is straining -[name] says diet is poor -she tries to eat a lot of fruit for breakfast  Pain Inventory Average pain 10 Pain Right Now 10 My pain is constant, tingling, sharp, dull, stabbing, aching, numbness, burning  In the last 24 hours, has pain interfered with the following? General activity 10 Relation with others 10 Enjoyment of life 10  What TIME of day is your pain at its worst? Morning, daytime, evening, night Sleep (in general) Good  Pain is worst with walking, bending, sitting, standing, some activities  Pain improves with rest, heat, exercise, medication  Relief with medication is not able to answer.  Family History  Problem Relation Age of Onset   Heart disease Mother    Heart disease Father    Stroke Father    Stroke Sister    Asthma Daughter    Colon cancer Neg Hx    Social History   Socioeconomic History   Marital status: Married    Spouse name: Not on file   Number of children: 2   Years of education: college   Highest education level: Not on file  Occupational History   Occupation: housewife    Employer: RETIRED  Tobacco Use   Smoking status: Never   Smokeless tobacco: Never  Vaping Use   Vaping status: Never Used  Substance and Sexual Activity   Alcohol use: No    Alcohol/week: 0.0 standard drinks of alcohol   Drug use: No   Sexual activity: Not on file  Other Topics Concern   Not on file  Social History Narrative   1 cup of Jasmine tea daily    Social Drivers of Health   Tobacco Use: Low Risk (07/06/2024)   Patient History    Smoking Tobacco Use: Never    Smokeless Tobacco Use: Never    Passive Exposure: Not on file  Financial Resource Strain: Low Risk  (04/21/2024)   Received from [facility]   Overall Financial Resource Strain (CARDIA)    Difficulty of Paying Living Expenses: Not hard at all  Food Insecurity: No Food  Insecurity (04/21/2024)   Received from [facility]   Epic    Within the past 12 months, you worried that your food would run out before you got the money to buy more.: Never true    Within the past 12 months, the food you bought just didn't last and you didn't have money to get more.: Never true  Transportation Needs: No Transportation Needs (04/21/2024)   Received from [facility] - Transportation    In the past 12 months, has lack of transportation kept you from medical appointments or from getting medications?: No    Lack of Transportation (Non-Medical): No  Physical Activity: Not on file  Stress: Not on file  Social Connections: Not on file  Depression (PHQ2-9): Low Risk (07/06/2024)   Depression (PHQ2-9)    PHQ-2 Score: 0  Alcohol Screen: Not on file  Housing: Low Risk  (04/21/2024)   Received from [facility]   Epic    In the last 12 months, was there a time when you were not able to pay the mortgage or rent on time?: No    In the past 12 months, how many times have you moved where you were living?: 0    At any time in the past 12 months, were you homeless or living in a shelter (including now)?: No  Utilities: Not At Risk (04/21/2024)   Received from [facility]   Epic    In the past 12 months has the electric, gas, oil, or water company threatened to shut off services in your home?: No  Health Literacy: Not on file   Past Surgical History:  Procedure Laterality Date   CHOLECYSTECTOMY  03/2010   COLONOSCOPY  2009   Dr. Author    CYSTOSCOPY     Multiple Cystoscopies with stents or biopsy    ESOPHAGOGASTRODUODENOSCOPY  10/2013   EYE SURGERY     bilateral cataract removal   Gamma knife radiation  01/2018   for trigeminal  neuralgia   REFRACTIVE SURGERY Bilateral 2022   RHIZOTOMY Left 04/28/2021   Procedure: Left Percutaneous trigeminal balloon rhizotomy;  Surgeon: Cheryle Debby LABOR, MD;  Location: [facility] OR;  Service: Neurosurgery;  Laterality: Left;   TONSILLECTOMY     Past Surgical History:  Procedure Laterality Date   CHOLECYSTECTOMY  03/2010   COLONOSCOPY  2009   Dr. Author    CYSTOSCOPY     Multiple Cystoscopies with stents or biopsy    ESOPHAGOGASTRODUODENOSCOPY  10/2013   EYE SURGERY     bilateral cataract removal   Gamma knife radiation  01/2018   for trigeminal neuralgia   REFRACTIVE SURGERY Bilateral 2022   RHIZOTOMY Left 04/28/2021   Procedure: Left Percutaneous trigeminal balloon rhizotomy;  Surgeon: Cheryle Debby LABOR, MD;  Location: [facility] OR;  Service: Neurosurgery;  Laterality: Left;   TONSILLECTOMY     Past Medical History:  Diagnosis Date   Adenomatous colon polyp 03/24/2018   Allergy    Arthritis    Cataract    Chronic headaches    Depression    Fibromyalgia    Gallstones    GERD (gastroesophageal reflux disease)    Hyperlipemia    Hypertension    Interstitial cystitis    Lactose intolerance    LBBB (left bundle branch block)    Osteoarthritis    Osteopenia    Pneumonia    Thyroid  disease    Tinnitus    Trigeminal neuralgia    There were no vitals taken for this visit.  Opioid Risk Score:   Fall Risk Score:  `1  Depression screen [facility] 2/9     07/06/2024    9:59 AM 05/08/2024    9:22 AM 04/03/2024   10:20 AM 02/08/2024   11:35 AM 11/19/2023   10:10 AM 10/05/2023    9:18 AM 05/25/2023   11:27 AM  Depression screen PHQ 2/9  Decreased Interest 0 0 0 0 0 0 0  Down, Depressed, Hopeless 0 0 1 0 0 0 0  PHQ - 2 Score 0 0 1 0 0 0 0  Altered sleeping   0  0    Tired, decreased energy   0  0    Change in appetite   0      Feeling bad or failure about yourself    0  0    Trouble concentrating   0  0    Moving slowly or fidgety/restless   0  0    Suicidal thoughts   0  0    PHQ-9 Score   1   0     Difficult doing work/chores   Somewhat difficult         Data saved with a previous flowsheet row definition    Review of  Systems  Constitutional: Negative.   HENT: Negative.    Eyes:  Positive for visual disturbance.  Respiratory: Negative.    Cardiovascular: Negative.   Gastrointestinal: Negative.   Endocrine: Negative.   Genitourinary: Negative.   Musculoskeletal: Negative.        Pain in left side of face  Skin: Negative.   Allergic/Immunologic: Negative.   Neurological:  Positive for dizziness, tremors, light-headedness and numbness.       Tingling   Hematological: Negative.   Psychiatric/Behavioral: Negative.    All other systems reviewed and are negative.      Objective:   Physical Exam PRIOR EXAM: Gen: no distress, normal appearing HEENT: oral mucosa pink and moist, NCAT Cardio: Reg  rate Chest: normal effort, normal rate of breathing Abd: soft, non-distended Ext: no edema Psych: pleasant, normal affect Skin: intact Neuro: Alert and oriented x3, left eye drop and facial droop- stable, right hip TTP . Assessment & Plan:  Mrs. Mcglaun is an 84 year old woman who presents for f/u of left sided trigeminal neuralgia and dizziness.    1) Sensory deafferation pain, left sided trigeminal neuralgia vs cluster headaches (she does have lacrimation and runny nose on that side). -discussed that Nucynta  has worsened her pain as did Morphine , d/c Morphine  -discussed that she did take hydrocodone  and this did seem to help now, have sent this refill for her -discussed that 20 years ago Dr. Clarice did a swab in her cheek to determine which drugs she can tolerate -discussed that she does not want to try flexeril -discussed that there is a new medication called Tonmya that helps with fibromyalgia that we could try for her trigeminal neuralgia, but she defers at this time.   -discussed that she cannot find anyone here who places intrathecal pumps -discussed that we have tried higher doses of medications but she is limited by side effects -decrease hydrocodone  to 2 tabs BID for today, morphine  15mg   immediate release ordered BID prn for 7 days, advised that once she is able to get this from her pharmacy she can try replacing hydrocodone  with morphine , advised to let me know how this works for her and to monitor for sedation  PRIOR HISTORY -discussed that hydrocodone  5mg  has been helpful when the pain is severe, discussed increasing so she has the option to take 1-2 tabs per day -discussed that her stimulator is currently off -discussed that Dr. Buelah PA  -discussed avoiding gluten -discussed that pain was so severe at one point that she did not want to live -discussed that her pain continues to be severe, advised continuing gabapentin  to 200mg  TD, discussed that she needs refill of Fentanyl  patch 25mcg- sent 10 patches, discussed that higher doses of gabapentin  cause dizziness and higher doses of fentanyl  patch make her sleepy -discussed that Journavx could help -discussed that seeing the water on the beach caused dizziness -decrease fentanyl  patch to 25mcg -discussed that insurance company rejected Nucynta , discussed potential costs of these -discussed that she has been happy with her new provider at [facility] -discussed that she is following up with Dr. Colon at [facility] -refilled fentanyl , discussed that she tried stopping this and pain was very severe -avoid proceed foods, eggs, gluten, dairy -recommended eating furits and vegetables -continue hydrocodone , discussed that she feel she needs either a higher dose or frequency, discussed that she has become somewhat more tolerant to the medication than she was 15 years ago -discussed that gabapentin  used to make her to lean to the left but now it makes her lean to the right -discussed that the gabapentin  makes her sleepy -discussed that she feels that the hydrocodone  is more effective for her than the gabapentin  -discussed [name] recommendation for an intrathecal pain pump, discussed that she has a trial scheduled for the pump,  discussed that she is considering this at [facility] -encouraged discussing with with the [facility] team risks and benefits of this procedure -discussed that she is doing Zoom calls with a psychiatrist -d/c narcan  and naltrexone, discussed that she uses the hydrocodone  sparingly.  -discussed that she had new programs installed on Monday -discussed that she is doing well with the hydrocodone  -discussed pruritus with the fentanyl  patch -continue following with pain psychologist,  discussed that I think it is phenomenal that she is doing this -refilled hydrocodone  and fentanyl  -discussed rating her pain for the programming of electrical stimulation   -discussed mechanism of action of low dose naltrexone as an opioid receptor antagonist which stimulates your body's production of its own natural endogenous opioids, helping to decrease pain. Discussed that it can also decrease T cell response and thus be helpful in decreasing inflammation, and symptoms of brain fog, fatigue, anxiety, depression, and allergies. Discussed that this medication needs to be compounded at a compounding pharmacy and can more expensive. Discussed that I usually start at 1mg  and if this is not providing enough relief then I titrate upward on a monthly basis.    -discussed that savella  caused hallucinations -discussed that we tried lyrica  and we cannot recall but she had some side effect -discussed that topamax  did not help and made her feel worse -discussed that carbamazpeine did not help -discussed that she is happy that she does not have to return to [place] as those trips caused her a lot of fatigue -discussed that pain was bad after she cooked for Thanksgiving -discussed her follow-up with [name] -discussed the negative experience when she last went to [place]  -discussed that next available appointment was on June 24th or 26th, but they were able to move this up to May 22nd or 24th -discussed that she keeps a journal with the  setting with the level of her pain.  -continue Diclofenac 3%, Gabapentin  5%, Lidocaine  5%, Menthol 1% compounded at [facility]:  apply 1-2 pumps three to four times per day as needed, discussed that this dose help -discussed that she has had to turn off the stimulator more since she has established care at [facility] -discussed response to her stimulator -discussed doubling the gabapentin  when pain is severe Increase low dose naltrexone to 3mg  HS -discussed her response to the stimulator She does have a history of migraines.  -Discussed that if she has to be in bed all day due to her pain it may be beneficial for her husband to give her at least one hydrocodone  per day to enable her to function -provided refill of Fentanyl  patch as she will be leaving for her surgery in [place] soon and her husband plans to stay there for some time in case she has any surgical complications.  -discussed her follow-up with pain psychology.  -recommended weaning off Fentanyl , discussed stopping fentanyl  because she is on 12.5mg  today but she had tried this a couple of weeks ago and pain was so severe that she needed patch to be replaced.  -discussed that she is taking hydrocodone  4 tablets per day, and recommend to decrease this to 3 tabs per day for three days and then to keep decreasing the tab by once per day.  -discussed that skipping the morning dose of hydrocodone  will at least allow her to sleep -recommended trying Frankincense and Myrrh essential oils mixed in coconut oil and applied to area of pain -recommended restarting Topamax  -called [facility] to prescribe compounding gel refill -continue Gabapentin  100mg  up to 8 times a day, discussed her current schedule of takin -discussed using Oragel multiple times per ay since this helps for an hour, use the topical cream intermittently in between -continue follow-up with [facility] to set up an appointment for her with them. Unfortunately Dr.  Zulema has retired but was able to set her up with Dr. Binnie who specializes in migraines and trigeminal neuralgia on July  25th. Let patient and husband know. They will mail her their address.   -commended her for calling Dr. Darlis as well to let him know how she is feeling -recommended taking 1 hydrocodone  now for the severe pain, and discussed that the Fentanyl  patch with take 12-24 hours to kick in -discussed with April the neurology referral and she did specify Dr. Kirke Solders and spoke with the [facility] department -discussed CBD oil, her son bought her some of this and she felt that it greatly helped  Recommend topical CBD oil- discussed its benefits in reducing inflammation, pain, insomnia, and anxiety, but she stopped because effect wore of.  -Discussed that CBD oil differs from marijuana in that it does not contain THC- the substance that causes euphoria.  -Discussed that it is made from the hemp plant.  -It has been used for thousands of years -preliminary research suggests that if may be able to shrink cancerous tumors, stop plaque formation in Alzheimer's Disease, and slow the progress of brain disease from concussions.  -Additional benefits that have been demonstrated in studies include improved nausea, indigestion, and brain health, and reduced seizures.  -In a survey 92% of patients who tried medical cannabis felt it improved symptoms such as chronic pain, arthritis, migraines, and cancer.   -Provided with a pain relief journal and discussed that it contains foods and lifestyle tips to naturally help to improve pain. Discussed that these lifestyle strategies are also very good for health unlike some medications which can have negative side effects. Discussed that the act of keeping a journal can be therapeutic and helpful to realize patterns what helps to trigger and alleviate pain.   -discussed with Dr. Saul trigeminal deafferation pain, discussed the difference  between this and trigeminal neuralgia with patient and husband, discussed motor cortex stimulation. -discussed whether or not to continue fentanyl  patch, husband does feel that the IV fentanyl   -encouraged following with Dr. Sweet/Dr. Cleotilde at [facility] to try procedure recommended by D. Ostegard.  -discussed mechanism of action of low dose naltrexone as an opioid receptor antagonist which stimulates your body's production of its own natural endogenous opioids, helping to decrease pain. Discussed that it can also decrease T cell response and thus be helpful in decreasing inflammation, and symptoms of brain fog, fatigue, anxiety, depression, and allergies. Discussed that this medication needs to be compounded at a compounding pharmacy and can more expensive. Discussed that I usually start at 1mg  and if this is not providing enough relief then I titrate upward on a monthly basis.   -stop lamotrigine  as it is making her dizzy without benefit -reviewed patient's medical notes -discussed her progress with getting a referral to [facility] -recommended going to ED given darkening of skin, worsening eye closure, finding of cyst, dizziness to receive prompt neurosurgical eval regarding if surgery is necessary, and also for pain management given worsening pain refractory to all treatments and resulting suicidality. Reviewed ED doc notes- he discussed with on-call neurology and NSGY and determined that patient could be discharged with outpatient follow-up. IV fentanyl  was administered with relief to patient. She was discharged on fentanyl  patch which has not provided relief. She is getting 12mcg- no side effects- discussed increasing to 25mcg and her husband is in agreement. Prior auth completed and approved. Discussed with husband starting patch on Satruday after she has worn 12mcg patch for 72 hours -encouraged follow-up with Dr. Saul, husband let me know appointment has been set up 1/18.  -recommended  follow-up  with Dr. 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  -Discussed that turmeric  -discussed steroid taper. -continue savella , husband feels like it is helping with her suicidal thoughts. Increase to 25mg .  Discontinued hydrocodone   -discuss mouth guard with dentist.  -d/c topamax  -she does have another procedure scheduled. -refilled Norco -follow-up monthly with Fidela and with me in February, placed on waitlist for earlier appointment with me, advised phone visits/MyChart are a great way to communicate in the interim -discussed that she can take an additional one or two tylenol  for breakthrough pain -recommended checking CMP for liver enzymes with next labs -prescribed NAC 600mg  BID to protect the liver while taking tylenol , discussed its health benefits for mitochondrial function -d/c Baclofen   -f/u with Dr. Saul and Dr. Darlis 1/18 -discussed retrying carbamazepine  to see if it is effective for her. It is $89 for her, so we discussed trying Lamictal  instead.  -encouraged anti-inflammatory diet.  -she had sinusitis that was viral and was treated with antibiotics. This was in the early 90s. The symptoms resolved after 2 months. She uses to get sinus infections and saline rinse.  -She has had multiple root canals.  -Discontinue 100% oxygen via facemask as this was not helpful.  -Discussed that unfortunately Sprint PNS is not indicated for craniofacial pain. -Amitriptyline  and Lyrica  did not help.  -Continue Cymbalta  60mg .  -Topiramate  helped but caused dizziness.  -Provided with hand out with information regarding trigeminal neuralgia -Failed Penytoin.  -trial  lidocaine  patch -She would like to try biofeedback. -reviewed neurology note.  -referred to Dr. Kirke Solders.  -will try trigger point injections next visit.  -Continue Ketamine 10%, Baclofen  2%, Cyclobenzaprine 2%, Ketoprofen 10%, Gabapentin  6%, Bupivacaine 1%, Amitryptiline 5% to apply to painful areas- using 3-4 times per day.  -Can consider Meloxicam if Phenytoin  does not help.  -Discussed Qutenza  as an option for neuropathic pain control. Discussed that this is a capsaicin  patch, stronger than capsaicin  cream. Discussed that it is currently approved for diabetic peripheral neuropathy and post-herpetic neuralgia, but that it has also shown benefit in treating other forms of neuropathy. Provided patient with link to site to learn more about the patch: [email or web address] .[email or web address]/. Discussed that the patch would be placed in office and benefits usually last 3 months. Discussed that unintended exposure to capsaicin  can cause severe irritation of eyes, mucous membranes, respiratory tract, and skin, but that Qutenza  is a local treatment and does not have the systemic side effects of other nerve medications. Discussed that there may be pain, itching, erythema, and decreased sensory function associated with the application of Qutenza . Side effects usually subside within 1 week. A cold pack of analgesic medications can help with these side effects. Blood pressure can also be increased due to pain associated with administration of the patch.  -she cut down on her gabapentin  due to her dizziness    Turmeric to reduce inflammation--can be used in cooking or taken as a supplement.  Benefits of turmeric:  -Highly anti-inflammatory  -Increases antioxidants  -Improves memory, attention, brain disease  -Lowers risk of heart disease  -May help prevent cancer  -Decreases pain  -Alleviates depression  -Delays aging and decreases risk of chronic disease  -Consume with black pepper to increase  absorption    Turmeric Milk Recipe:  1 cup milk  1 tsp turmeric  1 tsp cinnamon  1 tsp grated ginger (optional)  Black pepper (boosts the anti-inflammatory properties of turmeric).  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  -she is a good metabolizer of amitriptyline  and has tolerated nortrypilline in the past, but without much benefit.    3) CKD: encouraged 6-8 glasses of water per day. She states that she had AKI with Ibuprofen in the past. Discussed checking Cr level next visit if we are considering Meloxicam -Discussed that Nucynta , and other pain medications, may stay in her bloodstream longer given her CKD   4) Hypernatremia: encouraged hydration   5) General health and pain: provided with list of healthy foods that are both nutritious and fight pain.   6) Depressed: Amitriptyline  did help with this but she could not tolerate the drowsiness. Encouraged focusing on the factors that she can control, such as anti-inflammatory diet.   7) Fibromyalgia: Discussed goal to wean steroids prior to surgery. Continue 1mg  daily. She is on that intermittently. She started this again about a month ago. She has been on this about a year. -avoid gluten/dairy -continue gabapentin  -recommended eating a lot of fruits and vegetables -recommended avoiding eggs, gluten, dairy, processed foods Continue  Savella  -discussed benefits of cold water showers.  -continue gabapentin .  Continue fentanyl  and hydrocodone  -avoid processed foods -avoid gluten, dairy, and eggs -eat fruits, vegetables, spices, and herbs -Discussed current symptoms of pain and history of pain.  -Discussed benefits of exercise in reducing pain. -Discussed following foods that may reduce pain: 1) Ginger (especially studied for arthritis)- reduce leukotriene production to decrease inflammation 2) Blueberries- high in phytonutrients that decrease inflammation 3) Salmon- marine omega-3s reduce joint swelling and pain 4) Pumpkin seeds- reduce inflammation 5) dark chocolate- reduces inflammation 6) turmeric- reduces inflammation 7) tart cherries - reduce pain and stiffness 8) extra virgin olive oil - its compound olecanthal helps to block prostaglandins  9) chili peppers- can be eaten or applied topically via capsaicin  10) mint- helpful for headache, muscle aches, joint pain, and itching 11) garlic- reduces inflammation 12) Green tea- reduces inflammation and oxidative stress, helps with weight loss, may reduce the risk of cancer, recommend Double [employer] of Tea daily  Link to further information on diet for chronic pain: [email or web address]   8) Dizziness: -discussed that this could be from the combination of gabapentin  and baclofen .  -discussed that all the medications we are trying for pain may worsen the dizziness  9) Constipation:  -Provided list of following foods that help with constipation and highlighted a few: 1) prunes- contain high amounts of fiber.  2) apples- has a form of dietary fiber called pectin that accelerates stool movement and increases beneficial gut bacteria 3) pears- in addition to fiber, also high in fructose and sorbitol which have laxative effect 4) figs- contain an enzyme ficin which helps to speed colonic  transit 5) kiwis- contain an enzyme actinidin that improves gut motility and reduces constipation 6) oranges- rich in pectin (like apples) 7) grapefruits- contain a flavanol naringenin which has a laxative effect 8) vegetables- rich in fiber and also great sources of folate, vitamin C, and K 9) artichoke- high in inulin, prebiotic great for the microbiome 10) chicory- increases stool frequency and softness (can be added to coffee) 11) rhubarb- laxative effect 12) sweet potato- high fiber 13) beans, peas, and lentils- contain both soluble and insoluble fiber 14) chia seeds- improves intestinal health and gut flora 15) flaxseeds- laxative effect 16) whole grain rye bread- high in fiber 17) oat bran- high in soluble and insoluble fiber 18) kefir- softens stools -recommended to try at least one of these foods every day.  -drink 6-8 glasses of water per day -walk regularly, especially after meals.   10) Osteopenia - was tapered off the prednisone  -discussed that currently her pain may be more of a risk to her than osteopenia given her suicidal ideation from her pain -discussed risk of fall with the medications that make her dizziness.   11) Daytime sleepiness -discussed that this is secondary to her medications.  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  -discussed red light therapy  15) HTN: -discussed clonidine  patch could potentially help with HTN and pain.  -BP is 170/90 today.  -Advised checking BP daily at home and logging results to bring into follow-up appointment with PCP and myself. -Reviewed BP meds today.  -Advised regarding healthy foods that can help lower blood pressure and provided with a list: 1) citrus foods- high in vitamins and minerals 2) salmon and other  fatty fish - reduces inflammation and oxylipins 3) swiss chard (leafy green)- high level of nitrates 4) pumpkin seeds- one of the best natural sources of magnesium 5) Beans and lentils- high in fiber, magnesium, and potassium 6) Berries- high in flavonoids 7) Amaranth (whole grain, can be cooked similarly to rice and oats)- high in magnesium and fiber 8) Pistachios- even more effective at reducing BP than other nuts 9) Carrots- high in phenolic compounds that relax blood vessels and reduce inflammation 10) Celery- contain phthalides that relax tissues of arterial walls 11) Tomatoes- can also improve cholesterol and reduce risk of heart disease 12) Broccoli- good source of magnesium, calcium , and potassium 13) Greek yogurt: high in potassium and calcium  14) Herbs and spices: Celery seed, cilantro, saffron, lemongrass, black cumin, ginseng, cinnamon, cardamom, sweet basil, and ginger 15) Chia and flax seeds- also help to lower cholesterol and blood sugar 16) Beets- high levels of nitrates that relax blood vessels  17) spinach and bananas- high in potassium  -Provided lise of supplements that can help with hypertension:  1) magnesium: one high quality brand is Bioptemizers since it contains all 7 types of magnesium, otherwise over the counter magnesium gluconate 400mg  is a good option 2) B vitamins 3) vitamin D  4) potassium 5) CoQ10 6) L-arginine 7) Vitamin C 8) Beetroot -Educated that goal BP is 120/80. -Made goal to incorporate some of the above foods into diet.    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  -discussed that she had weaned off the steroids and had been doing fine off the steroids -discussed that aquatic therapy can help -discussed that she should encourage  follow-up with rheumatology to assess for giant cell arteritis -[facility] -continue 2mg  steroids  16. Sciatica: -consider red light therapy -discussed  that this has improved -discussed aquatherapy -discussed that the spinal injection helped temporarily -discussed that she has been taking 4mg  of low dose naltrexone -discussed that she used some hydrocodone  she had left over from the past.  -discussed lumbosacral orthosis, advised using for activities that involve bending/twisting/walking on uneven terrain -discussed that it would probably be better to repeat steroid injection than to continue hydrocodone  -advised to use lidocaine  patch -continue capsaicin  cream  17. Family illness: -discussed that she lost 2 nephews recently  53. Depression:  -continue cymbalta  -encouraged follow-up with psychiatry   19. Dizziness: -discussed that this has improved  20. 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  -discussed that she has difficult sewing due to this -discussed that she is requiring injections  22. Constipation: -discussed that fentanyl  and hydrocodone  can cause this Recommended eating an apple every day Recommended eating prunes every day -discussed that eating an apple per day and prune juice at night help, discussed that prune juice helps more than miralax   23. Rheumatoid arthritis: -discussed that she was recently diagnosed with Dr. Derrek -discussed that she was started on a medication that caused shortness of breath -continue 10mg  daily prednisone   -discussed that  hydrotherapy was helpful  24) Hyperglycemia: -discussed that I do not see a HgbA1c ordered  25) Neuropathy: -discussed that she has heaviness in both feet -discussed that Nucynta  was not helpful  26) Daytime somnolence: -discussed that she has to sleep a lot during the day  27) HTN: -discussed clonidine  but she already suffers from dry mouth -discussed restarting magnesium 400mg  at night -Advised checking BP daily at home and logging results to bring into follow-up appointment with PCP and myself. -Reviewed BP meds today.  -Advised regarding healthy foods that can help lower blood pressure and provided with a list: 1) citrus foods- high in vitamins and minerals 2) salmon and other fatty fish - reduces inflammation and oxylipins 3) swiss chard (leafy green)- high level of nitrates 4) pumpkin seeds- one of the best natural sources of magnesium 5) Beans and lentils- high in fiber, magnesium, and potassium 6) Berries- high in flavonoids 7) Amaranth (whole grain, can be cooked similarly to rice and oats)- high in magnesium and fiber 8) Pistachios- even more effective at reducing BP than other nuts 9) Carrots- high in phenolic compounds that relax blood vessels and reduce inflammation 10) Celery- contain phthalides that relax tissues of arterial walls 11) Tomatoes- can also improve cholesterol and reduce risk of heart disease 12) Broccoli- good source of magnesium, calcium , and potassium 13) Greek yogurt: high in potassium and calcium  14) Herbs and spices: Celery seed, cilantro, saffron, lemongrass, black cumin, ginseng, cinnamon, cardamom, sweet basil, and ginger 15) Chia and flax seeds- also help to lower cholesterol and blood sugar 16) Beets-  high levels of nitrates that relax blood vessels  17) spinach and bananas- high in potassium  -Provided lise of supplements that can help with hypertension:  1) magnesium: one high quality brand is Bioptemizers since it contains all 7 types of  magnesium, otherwise over the counter magnesium gluconate 400mg  is a good option 2) B vitamins 3) vitamin D  4) potassium 5) CoQ10 6) L-arginine 7) Vitamin C 8) Beetroot -Educated that goal BP is 120/80. -Made goal to incorporate some of the above foods into diet.   "

## 2024-07-16 ENCOUNTER — Encounter: Payer: Self-pay | Admitting: Physical Medicine and Rehabilitation

## 2024-07-18 ENCOUNTER — Other Ambulatory Visit: Payer: Self-pay | Admitting: Physical Medicine and Rehabilitation

## 2024-07-18 ENCOUNTER — Encounter: Admitting: Physical Medicine and Rehabilitation

## 2024-07-18 MED ORDER — FENTANYL 25 MCG/HR TD PT72: 1.0000 | MEDICATED_PATCH | TRANSDERMAL | 0 refills | Status: AC

## 2024-08-28 ENCOUNTER — Encounter: Admitting: Physical Medicine and Rehabilitation
